# Patient Record
Sex: Female | Born: 1950 | Race: White | Hispanic: No | Marital: Single | Smoking: Former smoker
Health system: Southern US, Community
[De-identification: ages and names within clinical notes are randomized; demographics above are authoritative.]

## PROBLEM LIST (undated history)

## (undated) DIAGNOSIS — I2510 Atherosclerotic heart disease of native coronary artery without angina pectoris: Secondary | ICD-10-CM

## (undated) DIAGNOSIS — K219 Gastro-esophageal reflux disease without esophagitis: Secondary | ICD-10-CM

## (undated) DIAGNOSIS — N83209 Unspecified ovarian cyst, unspecified side: Secondary | ICD-10-CM

## (undated) DIAGNOSIS — I6529 Occlusion and stenosis of unspecified carotid artery: Secondary | ICD-10-CM

## (undated) DIAGNOSIS — J45909 Unspecified asthma, uncomplicated: Secondary | ICD-10-CM

## (undated) DIAGNOSIS — I10 Essential (primary) hypertension: Secondary | ICD-10-CM

## (undated) DIAGNOSIS — L309 Dermatitis, unspecified: Secondary | ICD-10-CM

## (undated) DIAGNOSIS — N393 Stress incontinence (female) (male): Secondary | ICD-10-CM

## (undated) DIAGNOSIS — T783XXA Angioneurotic edema, initial encounter: Secondary | ICD-10-CM

## (undated) DIAGNOSIS — J439 Emphysema, unspecified: Secondary | ICD-10-CM

## (undated) DIAGNOSIS — F419 Anxiety disorder, unspecified: Secondary | ICD-10-CM

## (undated) DIAGNOSIS — E785 Hyperlipidemia, unspecified: Secondary | ICD-10-CM

## (undated) DIAGNOSIS — K118 Other diseases of salivary glands: Secondary | ICD-10-CM

## (undated) DIAGNOSIS — J069 Acute upper respiratory infection, unspecified: Secondary | ICD-10-CM

## (undated) DIAGNOSIS — D126 Benign neoplasm of colon, unspecified: Secondary | ICD-10-CM

## (undated) DIAGNOSIS — Z9289 Personal history of other medical treatment: Secondary | ICD-10-CM

## (undated) DIAGNOSIS — K645 Perianal venous thrombosis: Secondary | ICD-10-CM

## (undated) DIAGNOSIS — I219 Acute myocardial infarction, unspecified: Secondary | ICD-10-CM

## (undated) DIAGNOSIS — J449 Chronic obstructive pulmonary disease, unspecified: Secondary | ICD-10-CM

## (undated) DIAGNOSIS — R0602 Shortness of breath: Secondary | ICD-10-CM

## (undated) DIAGNOSIS — J189 Pneumonia, unspecified organism: Secondary | ICD-10-CM

## (undated) DIAGNOSIS — Z8719 Personal history of other diseases of the digestive system: Secondary | ICD-10-CM

## (undated) DIAGNOSIS — F341 Dysthymic disorder: Secondary | ICD-10-CM

## (undated) DIAGNOSIS — K2270 Barrett's esophagus without dysplasia: Secondary | ICD-10-CM

## (undated) DIAGNOSIS — R011 Cardiac murmur, unspecified: Secondary | ICD-10-CM

## (undated) DIAGNOSIS — N39498 Other specified urinary incontinence: Secondary | ICD-10-CM

## (undated) DIAGNOSIS — M899 Disorder of bone, unspecified: Secondary | ICD-10-CM

## (undated) DIAGNOSIS — G43909 Migraine, unspecified, not intractable, without status migrainosus: Secondary | ICD-10-CM

## (undated) DIAGNOSIS — B009 Herpesviral infection, unspecified: Secondary | ICD-10-CM

## (undated) DIAGNOSIS — Z803 Family history of malignant neoplasm of breast: Secondary | ICD-10-CM

## (undated) DIAGNOSIS — M949 Disorder of cartilage, unspecified: Secondary | ICD-10-CM

## (undated) DIAGNOSIS — J42 Unspecified chronic bronchitis: Secondary | ICD-10-CM

## (undated) DIAGNOSIS — I359 Nonrheumatic aortic valve disorder, unspecified: Secondary | ICD-10-CM

## (undated) DIAGNOSIS — Z1379 Encounter for other screening for genetic and chromosomal anomalies: Secondary | ICD-10-CM

## (undated) HISTORY — DX: Disorder of bone, unspecified: M89.9

## (undated) HISTORY — DX: Chronic obstructive pulmonary disease, unspecified: J44.9

## (undated) HISTORY — DX: Migraine, unspecified, not intractable, without status migrainosus: G43.909

## (undated) HISTORY — DX: Unspecified ovarian cyst, unspecified side: N83.209

## (undated) HISTORY — PX: TONSILLECTOMY AND ADENOIDECTOMY: SUR1326

## (undated) HISTORY — DX: Hyperlipidemia, unspecified: E78.5

## (undated) HISTORY — DX: Angioneurotic edema, initial encounter: T78.3XXA

## (undated) HISTORY — DX: Family history of malignant neoplasm of breast: Z80.3

## (undated) HISTORY — PX: APPENDECTOMY: SHX54

## (undated) HISTORY — DX: Dermatitis, unspecified: L30.9

## (undated) HISTORY — DX: Unspecified asthma, uncomplicated: J45.909

## (undated) HISTORY — PX: OTHER SURGICAL HISTORY: SHX169

## (undated) HISTORY — DX: Other specified urinary incontinence: N39.498

## (undated) HISTORY — DX: Dysthymic disorder: F34.1

## (undated) HISTORY — DX: Occlusion and stenosis of unspecified carotid artery: I65.29

## (undated) HISTORY — DX: Emphysema, unspecified: J43.9

## (undated) HISTORY — DX: Benign neoplasm of colon, unspecified: D12.6

## (undated) HISTORY — DX: Herpesviral infection, unspecified: B00.9

## (undated) HISTORY — PX: ADENOIDECTOMY: SUR15

## (undated) HISTORY — DX: Barrett's esophagus without dysplasia: K22.70

## (undated) HISTORY — DX: Encounter for other screening for genetic and chromosomal anomalies: Z13.79

## (undated) HISTORY — PX: NASAL SINUS SURGERY: SHX719

## (undated) HISTORY — DX: Acute upper respiratory infection, unspecified: J06.9

## (undated) HISTORY — PX: TOTAL ABDOMINAL HYSTERECTOMY: SHX209

## (undated) HISTORY — DX: Stress incontinence (female) (male): N39.3

## (undated) HISTORY — PX: BLEPHAROPLASTY: SUR158

## (undated) HISTORY — DX: Atherosclerotic heart disease of native coronary artery without angina pectoris: I25.10

## (undated) HISTORY — DX: Gastro-esophageal reflux disease without esophagitis: K21.9

## (undated) HISTORY — DX: Nonrheumatic aortic valve disorder, unspecified: I35.9

## (undated) HISTORY — DX: Disorder of cartilage, unspecified: M94.9

## (undated) HISTORY — DX: Essential (primary) hypertension: I10

---

## 1998-09-03 DIAGNOSIS — K2270 Barrett's esophagus without dysplasia: Secondary | ICD-10-CM

## 1998-09-03 HISTORY — DX: Barrett's esophagus without dysplasia: K22.70

## 2001-12-25 ENCOUNTER — Encounter: Payer: Self-pay | Admitting: Internal Medicine

## 2001-12-25 ENCOUNTER — Ambulatory Visit (HOSPITAL_COMMUNITY): Admission: RE | Admit: 2001-12-25 | Discharge: 2001-12-25 | Payer: Self-pay | Admitting: Internal Medicine

## 2004-07-11 ENCOUNTER — Ambulatory Visit: Payer: Self-pay | Admitting: Internal Medicine

## 2004-07-20 ENCOUNTER — Ambulatory Visit: Payer: Self-pay

## 2004-07-31 ENCOUNTER — Ambulatory Visit: Payer: Self-pay | Admitting: Internal Medicine

## 2004-11-01 ENCOUNTER — Ambulatory Visit: Payer: Self-pay | Admitting: Internal Medicine

## 2005-01-17 ENCOUNTER — Ambulatory Visit: Payer: Self-pay | Admitting: Internal Medicine

## 2005-02-05 ENCOUNTER — Ambulatory Visit: Payer: Self-pay | Admitting: Internal Medicine

## 2005-02-07 ENCOUNTER — Ambulatory Visit: Payer: Self-pay | Admitting: Internal Medicine

## 2005-02-19 ENCOUNTER — Encounter (INDEPENDENT_AMBULATORY_CARE_PROVIDER_SITE_OTHER): Payer: Self-pay | Admitting: *Deleted

## 2005-02-19 ENCOUNTER — Ambulatory Visit: Payer: Self-pay | Admitting: Internal Medicine

## 2005-03-27 ENCOUNTER — Ambulatory Visit: Payer: Self-pay | Admitting: Internal Medicine

## 2006-10-10 ENCOUNTER — Ambulatory Visit: Payer: Self-pay | Admitting: Internal Medicine

## 2006-10-27 ENCOUNTER — Emergency Department (HOSPITAL_COMMUNITY): Admission: EM | Admit: 2006-10-27 | Discharge: 2006-10-27 | Payer: Self-pay | Admitting: Emergency Medicine

## 2006-10-29 ENCOUNTER — Ambulatory Visit: Payer: Self-pay | Admitting: Pulmonary Disease

## 2006-10-29 ENCOUNTER — Ambulatory Visit: Payer: Self-pay | Admitting: Internal Medicine

## 2006-10-29 ENCOUNTER — Inpatient Hospital Stay (HOSPITAL_COMMUNITY): Admission: EM | Admit: 2006-10-29 | Discharge: 2006-11-05 | Payer: Self-pay | Admitting: Emergency Medicine

## 2006-10-29 ENCOUNTER — Ambulatory Visit: Admission: AD | Admit: 2006-10-29 | Discharge: 2006-10-29 | Payer: Self-pay | Admitting: Internal Medicine

## 2006-10-29 ENCOUNTER — Ambulatory Visit: Payer: Self-pay | Admitting: Cardiology

## 2006-11-01 ENCOUNTER — Encounter: Payer: Self-pay | Admitting: Cardiology

## 2006-11-11 ENCOUNTER — Ambulatory Visit: Payer: Self-pay | Admitting: Internal Medicine

## 2006-11-21 ENCOUNTER — Ambulatory Visit: Payer: Self-pay | Admitting: Internal Medicine

## 2006-12-09 ENCOUNTER — Ambulatory Visit: Payer: Self-pay | Admitting: Internal Medicine

## 2007-03-13 ENCOUNTER — Ambulatory Visit: Payer: Self-pay | Admitting: Internal Medicine

## 2007-04-14 DIAGNOSIS — M899 Disorder of bone, unspecified: Secondary | ICD-10-CM

## 2007-04-14 DIAGNOSIS — K2270 Barrett's esophagus without dysplasia: Secondary | ICD-10-CM | POA: Insufficient documentation

## 2007-04-14 DIAGNOSIS — M8580 Other specified disorders of bone density and structure, unspecified site: Secondary | ICD-10-CM | POA: Insufficient documentation

## 2007-04-14 DIAGNOSIS — I10 Essential (primary) hypertension: Secondary | ICD-10-CM | POA: Insufficient documentation

## 2007-04-14 HISTORY — DX: Disorder of bone, unspecified: M89.9

## 2007-04-14 HISTORY — DX: Essential (primary) hypertension: I10

## 2007-06-06 ENCOUNTER — Ambulatory Visit: Payer: Self-pay | Admitting: Internal Medicine

## 2007-07-23 ENCOUNTER — Ambulatory Visit: Payer: Self-pay | Admitting: Internal Medicine

## 2007-07-23 DIAGNOSIS — J449 Chronic obstructive pulmonary disease, unspecified: Secondary | ICD-10-CM

## 2007-07-23 DIAGNOSIS — J441 Chronic obstructive pulmonary disease with (acute) exacerbation: Secondary | ICD-10-CM | POA: Insufficient documentation

## 2007-07-23 DIAGNOSIS — J4489 Other specified chronic obstructive pulmonary disease: Secondary | ICD-10-CM

## 2007-07-23 HISTORY — DX: Chronic obstructive pulmonary disease, unspecified: J44.9

## 2007-07-23 HISTORY — DX: Other specified chronic obstructive pulmonary disease: J44.89

## 2007-08-26 ENCOUNTER — Ambulatory Visit: Payer: Self-pay | Admitting: Internal Medicine

## 2007-08-26 DIAGNOSIS — N39498 Other specified urinary incontinence: Secondary | ICD-10-CM

## 2007-08-26 HISTORY — DX: Other specified urinary incontinence: N39.498

## 2007-10-20 ENCOUNTER — Telehealth (INDEPENDENT_AMBULATORY_CARE_PROVIDER_SITE_OTHER): Payer: Self-pay | Admitting: *Deleted

## 2008-03-16 ENCOUNTER — Ambulatory Visit: Payer: Self-pay | Admitting: Internal Medicine

## 2008-03-16 LAB — CONVERTED CEMR LAB
BUN: 10 mg/dL (ref 6–23)
Calcium: 9.2 mg/dL (ref 8.4–10.5)
Creatinine, Ser: 0.7 mg/dL (ref 0.4–1.2)
GFR calc Af Amer: 111 mL/min
GFR calc non Af Amer: 91 mL/min
Glucose, Bld: 81 mg/dL (ref 70–99)
Potassium: 4.2 meq/L (ref 3.5–5.1)
Sodium: 132 meq/L — ABNORMAL LOW (ref 135–145)

## 2008-03-17 ENCOUNTER — Encounter: Payer: Self-pay | Admitting: Internal Medicine

## 2008-03-17 LAB — CONVERTED CEMR LAB: Vit D, 1,25-Dihydroxy: 32 (ref 30–89)

## 2008-04-20 ENCOUNTER — Ambulatory Visit: Payer: Self-pay | Admitting: Internal Medicine

## 2008-04-20 DIAGNOSIS — G43909 Migraine, unspecified, not intractable, without status migrainosus: Secondary | ICD-10-CM

## 2008-04-20 HISTORY — DX: Migraine, unspecified, not intractable, without status migrainosus: G43.909

## 2008-07-20 ENCOUNTER — Ambulatory Visit: Payer: Self-pay | Admitting: Internal Medicine

## 2008-07-20 LAB — CONVERTED CEMR LAB
BUN: 8 mg/dL (ref 6–23)
CO2: 29 meq/L (ref 19–32)
Calcium: 8.8 mg/dL (ref 8.4–10.5)
Chloride: 104 meq/L (ref 96–112)
Creatinine, Ser: 0.6 mg/dL (ref 0.4–1.2)
GFR calc non Af Amer: 109 mL/min
Glucose, Bld: 66 mg/dL — ABNORMAL LOW (ref 70–99)
Sodium: 138 meq/L (ref 135–145)

## 2008-07-21 ENCOUNTER — Encounter: Payer: Self-pay | Admitting: Internal Medicine

## 2008-08-09 ENCOUNTER — Ambulatory Visit: Payer: Self-pay | Admitting: Internal Medicine

## 2008-09-03 HISTORY — PX: CORONARY ANGIOPLASTY WITH STENT PLACEMENT: SHX49

## 2008-10-20 ENCOUNTER — Ambulatory Visit: Payer: Self-pay | Admitting: Internal Medicine

## 2008-11-16 ENCOUNTER — Ambulatory Visit: Payer: Self-pay | Admitting: Internal Medicine

## 2008-11-16 ENCOUNTER — Telehealth: Payer: Self-pay | Admitting: Internal Medicine

## 2009-02-15 ENCOUNTER — Ambulatory Visit: Payer: Self-pay | Admitting: Internal Medicine

## 2009-02-15 DIAGNOSIS — F341 Dysthymic disorder: Secondary | ICD-10-CM

## 2009-02-15 HISTORY — DX: Dysthymic disorder: F34.1

## 2009-04-12 ENCOUNTER — Ambulatory Visit: Payer: Self-pay | Admitting: Internal Medicine

## 2009-05-13 ENCOUNTER — Telehealth: Payer: Self-pay | Admitting: Internal Medicine

## 2009-05-31 ENCOUNTER — Ambulatory Visit: Payer: Self-pay | Admitting: Internal Medicine

## 2009-07-06 ENCOUNTER — Ambulatory Visit: Payer: Self-pay | Admitting: Internal Medicine

## 2009-07-06 LAB — CONVERTED CEMR LAB
Eosinophils Relative: 0.8 % (ref 0.0–5.0)
Hemoglobin: 13.2 g/dL (ref 12.0–15.0)
Lymphs Abs: 0.9 10*3/uL (ref 0.7–4.0)
MCHC: 33.3 g/dL (ref 30.0–36.0)
MCV: 95.4 fL (ref 78.0–100.0)
Monocytes Relative: 5.3 % (ref 3.0–12.0)
RBC: 4.15 M/uL (ref 3.87–5.11)
RDW: 13.9 % (ref 11.5–14.6)

## 2009-07-07 ENCOUNTER — Telehealth: Payer: Self-pay | Admitting: Internal Medicine

## 2009-08-22 ENCOUNTER — Ambulatory Visit: Payer: Self-pay | Admitting: Internal Medicine

## 2009-08-22 LAB — CONVERTED CEMR LAB
AST: 21 units/L (ref 0–37)
Alkaline Phosphatase: 39 units/L (ref 39–117)
BUN: 14 mg/dL (ref 6–23)
Basophils Absolute: 0 10*3/uL (ref 0.0–0.1)
Basophils Relative: 0.7 % (ref 0.0–3.0)
Bilirubin, Direct: 0 mg/dL (ref 0.0–0.3)
CO2: 31 meq/L (ref 19–32)
Calcium: 8.9 mg/dL (ref 8.4–10.5)
Chloride: 104 meq/L (ref 96–112)
Cholesterol: 268 mg/dL — ABNORMAL HIGH (ref 0–200)
Direct LDL: 177.3 mg/dL
Eosinophils Absolute: 0 10*3/uL (ref 0.0–0.7)
Eosinophils Relative: 0.5 % (ref 0.0–5.0)
GFR calc non Af Amer: 77.67 mL/min (ref >60)
Glucose, Bld: 104 mg/dL — ABNORMAL HIGH (ref 70–99)
HCT: 38.9 % (ref 36.0–46.0)
Hemoglobin: 13 g/dL (ref 12.0–15.0)
Lymphs Abs: 1.1 10*3/uL (ref 0.7–4.0)
Monocytes Absolute: 0.5 10*3/uL (ref 0.1–1.0)
Neutro Abs: 2.7 10*3/uL (ref 1.4–7.7)
Neutrophils Relative %: 61.4 % (ref 43.0–77.0)
Potassium: 4.7 meq/L (ref 3.5–5.1)
Specific Gravity, Urine: 1.015
TSH: 0.5 microintl units/mL (ref 0.35–5.50)
Total CHOL/HDL Ratio: 5
Total Protein: 6.4 g/dL (ref 6.0–8.3)
Urobilinogen, UA: 0.2
VLDL: 21.8 mg/dL (ref 0.0–40.0)
WBC: 4.3 10*3/uL — ABNORMAL LOW (ref 4.5–10.5)

## 2009-08-29 ENCOUNTER — Ambulatory Visit: Payer: Self-pay | Admitting: Internal Medicine

## 2009-08-29 DIAGNOSIS — E785 Hyperlipidemia, unspecified: Secondary | ICD-10-CM

## 2009-08-29 HISTORY — DX: Hyperlipidemia, unspecified: E78.5

## 2009-09-03 DIAGNOSIS — I219 Acute myocardial infarction, unspecified: Secondary | ICD-10-CM

## 2009-09-03 HISTORY — PX: OVARIAN CYST REMOVAL: SHX89

## 2009-09-03 HISTORY — DX: Acute myocardial infarction, unspecified: I21.9

## 2009-10-06 ENCOUNTER — Telehealth: Payer: Self-pay | Admitting: Internal Medicine

## 2009-11-17 ENCOUNTER — Ambulatory Visit: Payer: Self-pay | Admitting: Internal Medicine

## 2009-11-17 LAB — CONVERTED CEMR LAB
AST: 17 units/L (ref 0–37)
Bilirubin, Direct: 0.1 mg/dL (ref 0.0–0.3)
HDL: 71.4 mg/dL (ref >39.00)
Total Bilirubin: 0.4 mg/dL (ref 0.3–1.2)
Total CHOL/HDL Ratio: 3
Total Protein: 6.3 g/dL (ref 6.0–8.3)

## 2009-11-28 ENCOUNTER — Ambulatory Visit: Payer: Self-pay | Admitting: Internal Medicine

## 2010-01-05 ENCOUNTER — Telehealth: Payer: Self-pay | Admitting: Internal Medicine

## 2010-01-16 ENCOUNTER — Ambulatory Visit: Payer: Self-pay | Admitting: Internal Medicine

## 2010-01-16 DIAGNOSIS — B009 Herpesviral infection, unspecified: Secondary | ICD-10-CM | POA: Insufficient documentation

## 2010-01-16 HISTORY — DX: Herpesviral infection, unspecified: B00.9

## 2010-01-24 ENCOUNTER — Encounter (INDEPENDENT_AMBULATORY_CARE_PROVIDER_SITE_OTHER): Payer: Self-pay | Admitting: *Deleted

## 2010-02-13 ENCOUNTER — Ambulatory Visit: Payer: Self-pay | Admitting: Internal Medicine

## 2010-03-13 ENCOUNTER — Ambulatory Visit: Payer: Self-pay | Admitting: Internal Medicine

## 2010-04-19 ENCOUNTER — Ambulatory Visit: Payer: Self-pay | Admitting: Internal Medicine

## 2010-04-19 ENCOUNTER — Inpatient Hospital Stay (HOSPITAL_COMMUNITY): Admission: EM | Admit: 2010-04-19 | Discharge: 2010-04-22 | Payer: Self-pay | Admitting: Emergency Medicine

## 2010-04-19 ENCOUNTER — Telehealth: Payer: Self-pay | Admitting: Internal Medicine

## 2010-04-24 ENCOUNTER — Telehealth (INDEPENDENT_AMBULATORY_CARE_PROVIDER_SITE_OTHER): Payer: Self-pay | Admitting: *Deleted

## 2010-05-17 ENCOUNTER — Ambulatory Visit: Payer: Self-pay | Admitting: Internal Medicine

## 2010-05-17 ENCOUNTER — Encounter: Payer: Self-pay | Admitting: Physician Assistant

## 2010-05-17 DIAGNOSIS — I6529 Occlusion and stenosis of unspecified carotid artery: Secondary | ICD-10-CM

## 2010-05-17 DIAGNOSIS — I2510 Atherosclerotic heart disease of native coronary artery without angina pectoris: Secondary | ICD-10-CM | POA: Insufficient documentation

## 2010-05-17 DIAGNOSIS — I25119 Atherosclerotic heart disease of native coronary artery with unspecified angina pectoris: Secondary | ICD-10-CM | POA: Insufficient documentation

## 2010-05-17 HISTORY — DX: Atherosclerotic heart disease of native coronary artery without angina pectoris: I25.10

## 2010-05-17 HISTORY — DX: Occlusion and stenosis of unspecified carotid artery: I65.29

## 2010-05-18 ENCOUNTER — Ambulatory Visit: Payer: Self-pay | Admitting: Internal Medicine

## 2010-05-18 LAB — CONVERTED CEMR LAB

## 2010-05-24 ENCOUNTER — Encounter: Payer: Self-pay | Admitting: Internal Medicine

## 2010-06-09 ENCOUNTER — Ambulatory Visit: Payer: Self-pay | Admitting: Internal Medicine

## 2010-07-06 ENCOUNTER — Encounter: Payer: Self-pay | Admitting: Internal Medicine

## 2010-07-06 ENCOUNTER — Ambulatory Visit: Payer: Self-pay | Admitting: Internal Medicine

## 2010-07-06 ENCOUNTER — Ambulatory Visit: Payer: Self-pay | Admitting: Cardiology

## 2010-07-06 ENCOUNTER — Ambulatory Visit: Payer: Self-pay

## 2010-07-06 ENCOUNTER — Ambulatory Visit (HOSPITAL_COMMUNITY): Admission: RE | Admit: 2010-07-06 | Discharge: 2010-07-06 | Payer: Self-pay | Admitting: Internal Medicine

## 2010-07-07 ENCOUNTER — Ambulatory Visit: Payer: Self-pay | Admitting: Internal Medicine

## 2010-07-07 DIAGNOSIS — I359 Nonrheumatic aortic valve disorder, unspecified: Secondary | ICD-10-CM

## 2010-07-07 DIAGNOSIS — I350 Nonrheumatic aortic (valve) stenosis: Secondary | ICD-10-CM | POA: Insufficient documentation

## 2010-07-07 HISTORY — DX: Nonrheumatic aortic valve disorder, unspecified: I35.9

## 2010-07-11 LAB — CONVERTED CEMR LAB
ALT: 18 units/L (ref 0–35)
Albumin: 3.9 g/dL (ref 3.5–5.2)
Alkaline Phosphatase: 45 units/L (ref 39–117)
BUN: 12 mg/dL (ref 6–23)
CO2: 28 meq/L (ref 19–32)
Glucose, Bld: 100 mg/dL — ABNORMAL HIGH (ref 70–99)
HDL: 52.5 mg/dL (ref >39.00)
Total Bilirubin: 0.4 mg/dL (ref 0.3–1.2)
Total Protein: 6.2 g/dL (ref 6.0–8.3)

## 2010-07-13 ENCOUNTER — Encounter (INDEPENDENT_AMBULATORY_CARE_PROVIDER_SITE_OTHER): Payer: Self-pay | Admitting: Obstetrics and Gynecology

## 2010-07-13 ENCOUNTER — Inpatient Hospital Stay (HOSPITAL_COMMUNITY): Admission: RE | Admit: 2010-07-13 | Discharge: 2010-07-15 | Payer: Self-pay | Admitting: Obstetrics and Gynecology

## 2010-09-03 HISTORY — PX: AORTIC VALVE REPLACEMENT: SHX41

## 2010-10-02 ENCOUNTER — Encounter: Payer: Self-pay | Admitting: Internal Medicine

## 2010-10-02 ENCOUNTER — Other Ambulatory Visit: Payer: Self-pay | Admitting: Internal Medicine

## 2010-10-02 ENCOUNTER — Ambulatory Visit
Admission: RE | Admit: 2010-10-02 | Discharge: 2010-10-02 | Payer: Self-pay | Source: Home / Self Care | Attending: Internal Medicine | Admitting: Internal Medicine

## 2010-10-03 LAB — HDL CHOLESTEROL: HDL: 54.2 mg/dL (ref >39.00)

## 2010-10-05 NOTE — Assessment & Plan Note (Signed)
Summary: eph/mt   Visit Type:  Follow-up Primary Provider:  Stacie Glaze MD  CC:  no complaints.  History of Present Illness: This is a 60 year old white female patient who was admitted to the hospital with a non-ST elevation MI April 19, 2010. This was treated with a bare-metal stent in to the RCA. She has residual 50-60% circumflex. There was also mild reduction in LV function with inferior wall motion abnormality. Her hospitalization was complicated by an ovarian cyst for which she had a CT scan for her she is a very large pelvic ovarian cyst in which she will probably need laparoscopic resection in the next 3 months.  The patient returned to work 2 weeks after her MI. She is having occasional chest tightness that only lasts a few seconds and goes away before turns into anything. She said that similar to when she had her MI late it goes away quickly. She has not used nitroglycerin. This occurs at rest and usually when she is under stress at work. She has no exertional symptoms. She is actually asking to go skydiving a bulll riding. The patient denies dyspnea, dyspnea on exertion, palpitations, dizziness, or presyncope.  Current Medications (verified): 1)  Nadolol 20 Mg  Tabs (Nadolol) .... Take 1 Tablet By Mouth Once A Day 2)  Pristiq 100 Mg Xr24h-Tab (Desvenlafaxine Succinate) .... One By Mouth [place]aly 3)  Vivelle 0.05 Mg/24hr  Pttw (Estradiol) .... Change Twice A Week 4)  Dexilant 60 Mg Cpdr (Dexlansoprazole) .... One By Mouth Daily 5)  Carafate 1 Gm Tabs (Sucralfate) .Marland Kitchen.. 1 Three Times A Day 6)  Crestor 10 Mg Tabs (Rosuvastatin Calcium) .... Take One Tablet By Mouth Daily. 7)  Spiriva Handihaler 18 Mcg Caps (Tiotropium Bromide Monohydrate) .... One Ppuff By Mouth Daily 8)  Dulera 100-5 Mcg/act Aero (Mometasone Furo-Formoterol Fum) .... One Puf Two Times A Day 9)  Acyclovir 400 Mg Tabs (Acyclovir) .... One Ppo Three Times A Day As Needed Cold Sore 10)  Aspirin Ec 325 Mg Tbec (Aspirin)  .... Take One Tablet By Mouth Daily 11)  Plavix 75 Mg Tabs (Clopidogrel Bisulfate) .... Take One Tablet By Mouth Daily  Allergies (verified): 1)  ! Codeine Sulfate (Codeine Sulfate) 2)  ! Hydrocodone-Acetaminophen (Hydrocodone-Acetaminophen) 3)  ! Augmentin (Amoxicillin-Pot Clavulanate)  Past History:  Past Medical History: Last updated: 04/14/2007 GERD Hypertension Osteopenia menopause Barretts  Review of Systems       the history of present illness  Vital Signs:  Patient profile:   60 year old female Height:      62 inches Weight:      146 pounds Pulse rate:   59 / minute BP sitting:   144 / 93  (left arm) Cuff size:   large  Vitals Entered By: Burnett Kanaris, CNA (May 17, 2010 12:38 PM)  Physical Exam  General:   Well-nournished, in no acute distress. Neck: Bilateral carotid bruits versus murmur portrayed, No JVD, HJR,  or thyroid enlargement Lungs: No tachypnea, clear without wheezing, rales, or rhonchi Cardiovascular: RRR, PMI not displaced, heart sounds normal, 2-3/6 systolic murmur at the left sternal border and right sternal border,no gallops, bruit, thrill, or heave. Abdomen: BS normal. Soft without organomegaly, masses, lesions or tenderness. Extremities: Right and left groin stable without hematoma or hemorrhage,without cyanosis, clubbing or edema. Good distal pulses bilateral SKin: Warm, no lesions or rashes  Musculoskeletal: No deformities Neuro: no focal signs    EKG  Procedure date:  05/17/2010  Findings:  sinus bradycardia no acute change  Impression & Recommendations:  Problem # 1:  CAD, NATIVE VESSEL (ICD-414.01) Patient had a non-ST elevation MI April 19, 2010 treated with non-drug-eluting stent to the RCA with a residual 50-60% circumflex and mild LV dysfunction with and her wall motion abnormality. She is having occasional fleeting chest tightness that only lasts a few seconds. This occurs at rest. Not sure this is cardiac  related. She has no exertional symptoms. She does need laparoscopic surgery in the next 3 months for an ovarian cyst. I asked her to call if her chest pain worsens or becomes more prolonged period Her updated medication list for this problem includes:    Nadolol 20 Mg Tabs (Nadolol) .Marland Kitchen... Take 1 tablet by mouth once a day    Aspirin Ec 325 Mg Tbec (Aspirin) .Marland Kitchen... Take one tablet by mouth daily    Plavix 75 Mg Tabs (Clopidogrel bisulfate) .Marland Kitchen... Take one tablet by mouth daily  Problem # 2:  UNDIAGNOSED CARDIAC MURMURS (ICD-785.2) Patient had a systolic murmur consistent with AS. We will order a 2-D echo to verify. Her updated medication list for this problem includes:    Nadolol 20 Mg Tabs (Nadolol) .Marland Kitchen... Take 1 tablet by mouth once a day  Orders: Echocardiogram (Echo)  Problem # 3:  CAROTID BRUITS, BILATERAL (ICD-785.9)  Orders: Carotid Duplex (Carotid Duplex)  Problem # 4:  HYPERTENSION (ICD-401.9)  Her updated medication list for this problem includes:patient's blood pressure is low on the high side but she is quite anxious today. Need to monitor.    Nadolol 20 Mg Tabs (Nadolol) .Marland Kitchen... Take 1 tablet by mouth once a day    Aspirin Ec 325 Mg Tbec (Aspirin) .Marland Kitchen... Take one tablet by mouth daily  Other Orders: EKG w/ Interpretation (93000)  Patient Instructions: 1)  Your physician recommends that you schedule a follow-up appointment in 2weeks with Dr. Gala Romney for surgical clearance. 2)  Your physician recommends that you continue on your current medications as directed. Please refer to the Current Medication list given to you today. 3)  Your physician has requested that you have a carotid duplex. This test is an ultrasound of the carotid arteries in your neck. It looks at blood flow through these arteries that supply the brain with blood. Allow one hour for this exam. There are no restrictions or special instructions. 4)  Your physician has requested that you have an echocardiogram.   Echocardiography is a painless test that uses sound waves to create images of your heart. It provides your doctor with information about the size and shape of your heart and how well your heart's chambers and valves are working.  This procedure takes approximately one hour. There are no restrictions for this procedure.

## 2010-10-05 NOTE — Progress Notes (Signed)
Summary: REQ FOR RX  Phone Note Call from Patient   Caller: Patient [phone] Reason for Call: Talk to Nurse, Talk to Doctor Summary of Call: Pt called in to adv that she needs a RX for Prestiq (Desvenlafaxine).... Pt adv that she was given samples but has used them all and now needs a RX for med.... Pt adv that same can be sent to [facility] on [address].  Initial call taken by: Debbra Riding,  October 06, 2009 1:40 PM    Prescriptions: PRISTIQ 50 MG XR24H-TAB (DESVENLAFAXINE SUCCINATE) 1 once daily  #30 x 5   Entered by:   Willy Eddy, LPN   Authorized by:   Stacie Glaze MD   Signed by:   Willy Eddy, LPN on 64/40/3474   Method used:   Electronically to        [facility]  [phone]* (retail)       [address]       [place], [place]  [address]       Ph: [phone] or [phone]       Fax: [phone]   RxID:   [id number]

## 2010-10-05 NOTE — Assessment & Plan Note (Signed)
Summary: 3 month rov/njr   Vital Signs:  Patient profile:   60 year old female Height:      62 inches Weight:      148 pounds BMI:     27.17 Temp:     98.2 degrees F oral Pulse rate:   76 / minute Resp:     14 per minute BP sitting:   140 / 80  (left arm)  Vitals Entered By: Willy Eddy, LPN (November 28, 2009 4:38 PM) CC: roa labs, Lipid Management   CC:  roa labs and Lipid Management.  Lipid Management History:      Positive NCEP/ATP III risk factors include female age 42 years old or older and hypertension.  Negative NCEP/ATP III risk factors include non-diabetic, HDL cholesterol greater than 60, no family history for ischemic heart disease, non-tobacco-user status, no ASHD (atherosclerotic heart disease), no prior stroke/TIA, and no peripheral vascular disease.    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 Take 1 Tablet By Mouth Once A Day 2)  Pristiq 50 Mg Xr24h-Tab (Desvenlafaxine Succinate) .Marland Kitchen.. 1 Once Daily 3)  Vivelle 0.05 Mg/24hr  Pttw (Estradiol) .... Change Twice A Week 4)  Prevacid 15 Mg Cpdr (Lansoprazole) .... One By Mouth Bid 5)  Carafate 1 Gm Tabs (Sucralfate) .Marland Kitchen.. 1 Three Times A Day 6)  Vitamin D 91478 Unit  Caps  (Ergocalciferol) .... One By Mouth Weekly 7)  Symbicort 160-4.5 Mcg/act Aero (Budesonide-Formoterol Fumarate) .... One Puff By Mouth Two Times A Day 8)  Crestor 20 Mg Tabs (Rosuvastatin Calcium) .... One By Mouth Weekly Every Monday Night  Allergies (verified): 1)  ! Codeine Sulfate (Codeine Sulfate) 2)  ! Hydrocodone-Acetaminophen (Hydrocodone-Acetaminophen) 3)  ! Augmentin (Amoxicillin-Pot Clavulanate)  Past History:  Family History: Last updated: 06/06/2007 Family History of CAD Female 1st degree relative <60 COPD  Social History: Last updated: 06/06/2007 Married Former Smoker Alcohol use-no Drug use-no  Risk Factors: Smoking Status: quit (11/28/2009)  Past medical, surgical, family and social histories (including risk factors) reviewed, and no changes noted (except as noted below).  Past Medical History: Reviewed history from 04/14/2007 and no changes required. GERD Hypertension Osteopenia menopause Barretts  Past Surgical History: Reviewed history from 04/14/2007 and no changes required. Hysterectomy--TAH Sinus surgery Tonsillectomy, adenoidectomy  Family History: Reviewed history from 06/06/2007 and no changes required. Family History of CAD Female 1st degree relative <60 COPD  Social History: Reviewed history from 06/06/2007 and no changes required. Married Former Smoker Alcohol use-no Drug use-no  Review of Systems       The patient complains of hoarseness, dyspnea on exertion, and prolonged cough.  The patient denies anorexia, fever, weight loss, weight gain, vision loss, decreased hearing, chest pain, syncope, peripheral edema, headaches, hemoptysis, abdominal pain, melena, hematochezia, severe indigestion/heartburn, hematuria, incontinence, genital sores,  muscle weakness, suspicious skin lesions, transient blindness, difficulty walking, depression, unusual weight change, abnormal bleeding, enlarged lymph nodes, angioedema, and breast masses.     Physical Exam  General:  cachetic and pale.   Head:  normocephalic and atraumatic.   Eyes:  pupils equal and pupils round.   no hemmorrhages Ears:  R ear normal and L ear normal.   Nose:  nasal dischargemucosal pallor, mucosal edema, and airflow obstruction.   Neck:  nuchal rigidity and decreased ROM.   Lungs:  normal respiratory effort and no wheezes.   Heart:  normal rate and regular rhythm.   Abdomen:  soft, non-tender, and normal bowel sounds.   Msk:  no joint swelling, no joint warmth, and no redness over joints.   Extremities:  No clubbing, cyanosis, edema, or deformity noted with normal full range of motion of all joints.     Impression & Recommendations:  Problem # 1:  HYPERLIPIDEMIA, MILD (ICD-272.4)  Her updated medication list for this problem includes:    Crestor 20 Mg Tabs (Rosuvastatin calcium) ..... One by mouth weekly every monday night  Labs Reviewed: SGOT: 17 (11/17/2009)   SGPT: 17 (11/17/2009)  Prior 10 Yr Risk Heart Disease: Not enough information (07/23/2007)   HDL:71.40 (11/17/2009), 54.20 (08/22/2009)  Chol:210 (11/17/2009), 268 (08/22/2009)  Trig:109.0 (11/17/2009), 109.0 (08/22/2009)  Problem # 2:  CHRONIC OBSTRUCTIVE ASTHMA UNSPECIFIED (ICD-493.20)  The following medications were removed from the medication list:    Symbicort 160-4.5 Mcg/act Aero (Budesonide-formoterol fumarate) ..... One puff by mouth two times a day Her updated medication list for this problem includes:    Advair Diskus 250-50 Mcg/dose Aepb (Fluticasone-salmeterol) ..... One puff two times a day  Complete Medication List: 1)  Nadolol 20 Mg Tabs (Nadolol) .... Take 1 tablet by mouth once a day 2)  Pristiq 50 Mg Xr24h-tab (Desvenlafaxine succinate) .Marland Kitchen.. 1 once daily 3)  Vivelle 0.05 Mg/24hr Pttw (Estradiol) .... Change twice a week 4)  Prevacid 15 Mg Cpdr (Lansoprazole) .... One by mouth bid 5)  Carafate 1 Gm Tabs (Sucralfate) .Marland Kitchen.. 1 three times a day 6)  Vitamin D 04540 Unit  Caps (Ergocalciferol) .... One by mouth weekly 7)  Crestor 20 Mg Tabs (Rosuvastatin calcium) .... One by mouth weekly every monday night 8)  Advair Diskus 250-50 Mcg/dose Aepb (Fluticasone-salmeterol) .... One puff two times a day  Lipid Assessment/Plan:      Based on NCEP/ATP III, the patient's risk factor category is "0-1 risk factors".  The patient's lipid goals are as follows: Total cholesterol goal is 200; LDL cholesterol goal is 160; HDL cholesterol goal is 40; Triglyceride goal is 150.     Patient Instructions: 1)  Please schedule a follow-up appointment in 1 month.

## 2010-10-05 NOTE — Assessment & Plan Note (Signed)
Summary: 3 wk rov/njr   Vital Signs:  Patient profile:   60 year old female Height:      62 inches Weight:      148 pounds BMI:     27.17 Temp:     98.2 degrees F oral Pulse rate:   72 / minute Resp:     14 per minute BP sitting:   132 / 86  Vitals Entered By: Willy Eddy, LPN (June 09, 2010 4:30 PM)  Nutrition Counseling: Patient's BMI is greater than 25 and therefore counseled on weight management options. CC: roa after increasing nadolol  to bid, Hypertension Management Is Patient Diabetic? No   Primary Care Wilbern Pennypacker:  Stacie Glaze MD  CC:  roa after increasing nadolol  to bid and Hypertension Management.  History of Present Illness: follow up blood pressure increased nadolol to two times a day this has heled her migraines which are less frequent  Hypertension Follow-Up      This is a 60 year old woman who presents for Hypertension follow-up.  The patient denies lightheadedness, urinary frequency, headaches, edema, impotence, rash, and fatigue.  The patient denies the following associated symptoms: chest pain, chest pressure, exercise intolerance, dyspnea, palpitations, syncope, leg edema, and pedal edema.  Compliance with medications (by patient report) has been near 100%.  The patient reports that dietary compliance has been good.  The patient reports exercising 3-4X per week.    Hypertension History:      She denies headache, chest pain, palpitations, dyspnea with exertion, orthopnea, PND, peripheral edema, visual symptoms, neurologic problems, syncope, and side effects from treatment.        Positive major cardiovascular risk factors include female age 5 years old or older, hyperlipidemia, and hypertension.  Negative major cardiovascular risk factors include no history of diabetes, negative family history for ischemic heart disease, and non-tobacco-user status.        Positive history for target organ damage include ASHD (either angina/prior MI/prior CABG).   Further assessment for target organ damage reveals no history of cardiac end-organ damage (CHF/LVH), stroke/TIA, peripheral vascular disease, renal insufficiency, or hypertensive retinopathy.     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) 7)  Anxiety Depression  (ICD-300.4) 8)  Chronic Obstructive Asthma Unspecified  (ICD-493.20) 9)  Acute Maxillary Sinusitis  (ICD-461.0) 10)  Migraine Headache  (ICD-346.90) 11)  Urinary Incontinence, Stress, Mild  (ICD-788.39) 12)  Chronic Obstructive Pulmonary Disease, Mild  (ICD-496) 13)  Family History of Cad Female 1st Degree Relative <60  (ICD-V16.49) 14)  Barrett's Esophagus  (ICD-530.85) 15)  Menopausal Syndrome  (ICD-627.2) 16)  Osteopenia  (ICD-733.90) 17)  Hypertension  (ICD-401.9) 18)  Gerd  (ICD-530.81)  Current Problems (verified): 1)  Cad, Native Vessel  (ICD-414.01) 2)  Undiagnosed Cardiac Murmurs  (ICD-785.2) 3)  Carotid Bruits, 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) 4)  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) 5)  Preventive Health Care  (ICD-V70.0) 6)  Hyperlipidemia, Mild  (ICD-272.4) 7)  Anxiety Depression  (ICD-300.4) 8)  Chronic Obstructive Asthma Unspecified  (ICD-493.20) 9)  Acute  Maxillary Sinusitis  (ICD-461.0) 10)  Migraine Headache  (ICD-346.90) 11)  Urinary Incontinence, Stress, Mild  (ICD-788.39) 12)  Chronic Obstructive Pulmonary Disease, Mild  (ICD-496) 13)  Family History of Cad Female 1st Degree Relative <60  (ICD-V16.49) 14)  Barrett's Esophagus  (ICD-530.85) 15)  Menopausal Syndrome   (ICD-627.2) 16)  Osteopenia  (ICD-733.90) 17)  Hypertension  (ICD-401.9) 18)  Gerd  (ICD-530.81)  Medications Prior to Update: 1)  Nadolol 20 Mg  Tabs (Nadolol) .... Take 1 Tablet By Mouth Two Times A Day ( Increased 05/18/2010) 2)  Pristiq 100 Mg Xr24h-Tab (Desvenlafaxine Succinate) .... One By Mouth Daly 3)  Vivelle 0.05 Mg/24hr  Pttw (Estradiol) .... Change Twice A Week 4)  Dexilant 60 Mg Cpdr (Dexlansoprazole) .... One By Mouth Daily 5)  Carafate 1 Gm Tabs (Sucralfate) .Marland Kitchen.. 1 Three Times A Day 6)  Crestor 10 Mg Tabs (Rosuvastatin Calcium) .... Take One Tablet By Mouth Daily. 7)  Spiriva Handihaler 18 Mcg Caps (Tiotropium Bromide Monohydrate) .... One Ppuff By Mouth Daily 8)  Dulera 100-5 Mcg/act Aero (Mometasone Furo-Formoterol Fum) .... One Puf Two Times A Day 9)  Acyclovir 400 Mg Tabs (Acyclovir) .... One Ppo Three Times A Day As Needed Cold Sore 10)  Aspirin Ec 325 Mg Tbec (Aspirin) .... Take One Tablet By Mouth Daily 11)  Plavix 75 Mg Tabs (Clopidogrel Bisulfate) .... Take One Tablet By Mouth Daily  Current Medications (verified): 1)  Nadolol 20 Mg  Tabs (Nadolol) .... Take 1 Tablet By Mouth Two Times A Day ( Increased 05/18/2010) 2)  Pristiq 100 Mg Xr24h-Tab (Desvenlafaxine Succinate) .... One By Mouth Daly 3)  Vivelle 0.05 Mg/24hr  Pttw (Estradiol) .... Change Twice A Week 4)  Dexilant 60 Mg Cpdr (Dexlansoprazole) .... One By Mouth Daily 5)  Carafate 1 Gm Tabs (Sucralfate) .Marland Kitchen.. 1 Three Times A Day 6)  Crestor 10 Mg Tabs (Rosuvastatin Calcium) .... Take One Tablet By Mouth Daily. 7)  Spiriva Handihaler 18 Mcg Caps (Tiotropium Bromide Monohydrate) .... One Ppuff By Mouth Daily 8)  Dulera 100-5 Mcg/act Aero (Mometasone Furo-Formoterol Fum) .... One Puf Two Times A Day 9)  Acyclovir 400 Mg Tabs (Acyclovir) .... One Ppo Three Times A Day As Needed Cold Sore 10)  Aspirin Ec 325 Mg Tbec (Aspirin) .... Take One Tablet By Mouth Daily 11)  Plavix 75 Mg Tabs (Clopidogrel Bisulfate) ....  Take One Tablet By Mouth Daily  Allergies (verified): 1)  ! Codeine Sulfate (Codeine Sulfate) 2)  ! Hydrocodone-Acetaminophen (Hydrocodone-Acetaminophen) 3)  ! Augmentin (Amoxicillin-Pot Clavulanate)  Past History:  Family History: Last updated: 06/06/2007 Family History of CAD Female 1st degree relative <60 COPD  Social History: Last updated: 06/06/2007 Married Former Smoker Alcohol use-no Drug use-no  Risk Factors: Smoking Status: quit (06/09/2010)  Past medical, surgical, family and social histories (including risk factors) reviewed, and no changes noted (except as noted below).  Past Medical History: Reviewed history from 04/14/2007 and no changes required. GERD Hypertension Osteopenia menopause Barretts  Past Surgical History: Reviewed history from 04/14/2007 and no changes required. Hysterectomy--TAH Sinus surgery Tonsillectomy, adenoidectomy  Family History: Reviewed history from 06/06/2007 and no changes required. Family History of CAD Female 1st degree relative <60 COPD  Social History: Reviewed history from 06/06/2007 and no changes required. Married Former Smoker Alcohol use-no Drug use-no  Review of Systems  The patient denies anorexia, fever, weight loss, weight gain, vision loss, decreased hearing, hoarseness, chest pain, syncope, dyspnea on exertion, peripheral edema, prolonged cough, headaches, hemoptysis, abdominal pain, melena, hematochezia,  severe indigestion/heartburn, hematuria, incontinence, genital sores, muscle weakness, suspicious skin lesions, transient blindness, difficulty walking, depression, unusual weight change, abnormal bleeding, enlarged lymph nodes, angioedema, and breast masses.    Physical Exam  General:  alert and well-nourished.   Head:  normocephalic and atraumatic.   Eyes:  pupils equal and pupils round.   no hemmorrhages Ears:  R ear normal and L ear normal.   Nose:  External nasal examination shows no deformity  or inflammation. Nasal mucosa are pink and moist without lesions or exudates. Mouth:  pharynx pink and moist and no erythema.   Neck:  supple.   Lungs:  normal respiratory effort and R wheezes.   Heart:  normal rate and regular rhythm.  grade  1/6 systolic murmur.  mitral Abdomen:  Bowel sounds positive,abdomen soft and non-tender without masses, organomegaly or hernias noted. Msk:  No deformity or scoliosis noted of thoracic or lumbar spine.   Extremities:  No clubbing, cyanosis, edema, or deformity noted with normal full range of motion of all joints.   Neurologic:  alert & oriented X3 and finger-to-nose normal.     Impression & Recommendations:  Problem # 1:  MIGRAINE HEADACHE (ICD-346.90) Assessment Unchanged  Her updated medication list for this problem includes:    Nadolol 20 Mg Tabs (Nadolol) .Marland Kitchen... Take 1 tablet by mouth two times a day ( increased 05/18/2010)    Aspirin Ec 325 Mg Tbec (Aspirin) .Marland Kitchen... Take one tablet by mouth daily  Headache diary reviewed.  Problem # 2:  HYPERTENSION (ICD-401.9) Assessment: Unchanged  Her updated medication list for this problem includes:    Nadolol 20 Mg Tabs (Nadolol) .Marland Kitchen... Take 1 tablet by mouth two times a day ( increased 05/18/2010)  BP today: 132/86 Prior BP: 140/90 (05/18/2010)  Prior 10 Yr Risk Heart Disease: N/A (05/18/2010)  Labs Reviewed: K+: 4.7 (08/22/2009) Creat: : 0.8 (08/22/2009)   Chol: 210 (11/17/2009)   HDL: 71.40 (11/17/2009)   TG: 109.0 (11/17/2009)  Problem # 3:  CHRONIC OBSTRUCTIVE PULMONARY DISEASE, MILD (ICD-496)  nneds PFTs next ov Her updated medication list for this problem includes:    Spiriva Handihaler 18 Mcg Caps (Tiotropium bromide monohydrate) ..... One ppuff by mouth daily    Dulera 100-5 Mcg/act Aero (Mometasone furo-formoterol fum) ..... One puf two times a day  Vaccines Reviewed: Flu Vax: Historical (08/03/2009)  Complete Medication List: 1)  Nadolol 20 Mg Tabs (Nadolol) .... Take 1 tablet by  mouth two times a day ( increased 05/18/2010) 2)  Pristiq 100 Mg Xr24h-tab (Desvenlafaxine succinate) .... One by mouth daly 3)  Vivelle 0.05 Mg/24hr Pttw (Estradiol) .... Change twice a week 4)  Dexilant 60 Mg Cpdr (Dexlansoprazole) .... One by mouth daily 5)  Carafate 1 Gm Tabs (Sucralfate) .Marland Kitchen.. 1 three times a day 6)  Crestor 10 Mg Tabs (Rosuvastatin calcium) .... Take one tablet by mouth daily. 7)  Spiriva Handihaler 18 Mcg Caps (Tiotropium bromide monohydrate) .... One ppuff by mouth daily 8)  Dulera 100-5 Mcg/act Aero (Mometasone furo-formoterol fum) .... One puf two times a day 9)  Acyclovir 400 Mg Tabs (Acyclovir) .... One ppo three times a day as needed cold sore 10)  Aspirin Ec 325 Mg Tbec (Aspirin) .... Take one tablet by mouth daily 11)  Plavix 75 Mg Tabs (Clopidogrel bisulfate) .... Take one tablet by mouth daily  Hypertension Assessment/Plan:      The patient's hypertensive risk group is category C: Target organ damage and/or diabetes.  Today's blood pressure is 132/86.  Her blood pressure goal is < 140/90.  Patient Instructions: 1)  tale 40 of nbadolo the night before surgery and hold the AM dose on the day of surgery 2)  Please schedule a follow-up appointment in 4 months.

## 2010-10-05 NOTE — Assessment & Plan Note (Signed)
Summary: 1 MONTH ROV/NJR/pt rescd//ccm   Vital Signs:  Patient profile:   60 year old female Height:      62 inches Weight:      146 pounds BMI:     26.80 Temp:     98.2 degrees F oral Pulse rate:   72 / minute Resp:     14 per minute BP sitting:   136 / 80  (left arm)  Vitals Entered By: Willy Eddy, LPN (Jan 16, 2010 1:44 PM) CC: roa   CC:  roa.  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 Take 1 Tablet By Mouth Once A Day 2)  Pristiq 50 Mg Xr24h-Tab (Desvenlafaxine Succinate) .Marland Kitchen.. 1 Once Daily 3)  Vivelle 0.05 Mg/24hr  Pttw (Estradiol) .... Change Twice A Week 4)  Prevacid 15 Mg Cpdr (Lansoprazole) .... One By Mouth Bid 5)  Carafate 1 Gm Tabs (Sucralfate) .Marland Kitchen.. 1 Three Times A Day 6)  Vitamin D 16109 Unit  Caps (Ergocalciferol) .... One By Mouth  Weekly 7)  Crestor 20 Mg Tabs (Rosuvastatin Calcium) .... One By Mouth Weekly Every Monday Night 8)  Advair Diskus 250-50 Mcg/dose Aepb (Fluticasone-Salmeterol) .... One Puff Two Times A Day  Allergies (verified): 1)  ! Codeine Sulfate (Codeine Sulfate) 2)  ! Hydrocodone-Acetaminophen (Hydrocodone-Acetaminophen) 3)  ! Augmentin (Amoxicillin-Pot Clavulanate)  Past History:  Family History: Last updated: 06/06/2007 Family History of CAD Female 1st degree relative <60 COPD  Social History: Last updated: 06/06/2007 Married Former Smoker Alcohol use-no Drug use-no  Risk Factors: Smoking Status: quit (01/16/2010)  Past medical, surgical, family and social histories (including risk factors) reviewed, and no changes noted (except as noted below).  Past Medical History: Reviewed history from 04/14/2007 and no changes required. GERD Hypertension Osteopenia menopause Barretts  Past Surgical History: Reviewed history from 04/14/2007 and no changes required. Hysterectomy--TAH Sinus surgery Tonsillectomy, adenoidectomy  Family History: Reviewed history from 06/06/2007 and no changes required. Family History of CAD Female 1st degree relative <60 COPD  Social History: Reviewed history from 06/06/2007 and no changes required. Married Former Smoker Alcohol use-no Drug use-no  Review of Systems  The patient denies anorexia, fever, weight loss, weight gain, vision loss, decreased hearing, hoarseness, chest pain, syncope, dyspnea on exertion, peripheral edema, prolonged cough, headaches, hemoptysis, abdominal pain, melena, hematochezia, severe indigestion/heartburn, hematuria, incontinence, genital sores, muscle weakness, suspicious skin lesions, transient  blindness, difficulty walking, depression, unusual weight change, abnormal bleeding, enlarged lymph nodes, angioedema, and breast masses.    Physical Exam  General:  cachetic and pale.   Head:  normocephalic and atraumatic.    Eyes:  pupils equal and pupils round.   no hemmorrhages Ears:  R ear normal and L ear normal.   Nose:  nasal dischargemucosal pallor, mucosal edema, and airflow obstruction.   Mouth:  pharynx pink and moist and no erythema.   Neck:  nuchal rigidity and decreased ROM.   Lungs:  normal respiratory effort and no wheezes.   Heart:  normal rate and regular rhythm.     Impression & Recommendations:  Problem # 1:  CHRONIC OBSTRUCTIVE ASTHMA UNSPECIFIED (ICD-493.20)  The following medications were removed from the medication list:    Advair Diskus 250-50 Mcg/dose Aepb (Fluticasone-salmeterol) ..... One puff two times a day Her updated medication list for this problem includes:    Spiriva Handihaler 18 Mcg Caps (Tiotropium bromide monohydrate) ..... One ppuff by mouth daily    Dulera 100-5 Mcg/act Aero (Mometasone furo-formoterol fum) ..... One puf two times a day  Current Asthma Management Plan:  [place]:  SPIRIVA HANDIHALER 18 MCG CAPS SPIRIVA HANDIHALER 18 MCG CAPS:  1 inhalation once a day DULERA 100-5 MCG/ACT AERO:  1 inhalation twice a day  Problem # 2:  HERPES SIMPLEX WITHOUT MENTION OF COMPLICATION (ICD-054.9) zoviras 400 three times a day as needed sold sores  Complete Medication List: 1)  Nadolol 20 Mg Tabs (Nadolol) .... Take 1 tablet by mouth once a day 2)  Pristiq 50 Mg Xr24h-tab (Desvenlafaxine succinate) .Marland Kitchen.. 1 once daily 3)  Vivelle 0.05 Mg/24hr Pttw (Estradiol) .... Change twice a week 4)  Prevacid 15 Mg Cpdr (Lansoprazole) .... One by mouth bid 5)  Carafate 1 Gm Tabs (Sucralfate) .Marland Kitchen.. 1 three times a day 6)  Vitamin D 04540 Unit Caps (Ergocalciferol) .... One by mouth weekly 7)  Crestor 20 Mg Tabs (Rosuvastatin calcium) .... One by mouth weekly every monday night 8)  Spiriva Handihaler 18 Mcg Caps (Tiotropium bromide monohydrate) .... One ppuff by mouth daily 9)  Dulera 100-5 Mcg/act Aero (Mometasone furo-formoterol fum) .... One puf two times a day 10)   Acyclovir 400 Mg Tabs (Acyclovir) .... One ppo three times a day as needed cold sore  Asthma Management Plan    Asthma Severity: Moderate Persistent    Plan based on PEF formula: [place] Zone:SPIRIVA HANDIHALER 18 MCG CAPS SPIRIVA HANDIHALER 18 MCG CAPS:  1 inhalation once a day DULERA 100-5 MCG/ACT AERO:  1 inhalation twice a day  Patient Instructions: 1)  Please schedule a follow-up appointment in 1 month. Prescriptions: ACYCLOVIR 400 MG TABS (ACYCLOVIR) one ppo three times a day as needed cold sore  #30 x 11   Entered and Authorized by:   Stacie Glaze MD   Signed by:   Stacie Glaze MD on 01/16/2010   Method used:   Electronically to        [facility]  [phone]* (retail)       [address]       [place], [place]  [address]       Ph: [phone] or [phone]       Fax: [phone]   RxID:   [id number]

## 2010-10-05 NOTE — Letter (Signed)
Summary: Colonoscopy Letter  [facility]  [address] [place], [place] [address]   Phone: [phone]  Fax: [phone]      Jan 24, 2010 MRN: [id number]   [facility] [address] [place], [place]  [address]   Dear Ms. Akerley,   According to your medical record, it is time for you to schedule a Colonoscopy. The American Cancer Society recommends this procedure as a method to detect early colon cancer. Patients with a family history of colon cancer, or a personal history of colon polyps or inflammatory bowel disease are at increased risk.  This letter has beeen generated based on the recommendations made at the time of your procedure. If you feel that in your particular situation this may no longer apply, please contact our office.  Please call our office at [phone] to schedule this appointment or to update your records at your earliest convenience.  Thank you for cooperating with [place] to provide you with the very best care possible.   Sincerely,  Hedwig Morton. Juanda Chance, M.D.  [facility] [phone]

## 2010-10-05 NOTE — Assessment & Plan Note (Signed)
Summary: 1 month rov/njr   Vital Signs:  Patient profile:   60 year old female Height:      62 inches Weight:      150 pounds BMI:     27.53 Temp:     98.2 degrees F oral Pulse rate:   72 / minute Resp:     14 per minute BP sitting:   124 / 70  (left arm)  Vitals Entered By: Willy Eddy, LPN (March 13, 2010 4:05 PM) CC: roa after i9ncreasing pristiq to 100 qd which is helping, Hypertension Management   CC:  roa after i9ncreasing pristiq to 100 qd which is helping and Hypertension Management.  History of Present Illness: ROv for medication changes increased pristiq with improvement in mood and less tearfuness monitering of chronic problems HTN stable, HA improved and Asthms with moderate flair increased use of rescue inhailer and flairs in wheeszing reported  Hypertension History:      She denies headache, chest pain, palpitations, dyspnea with exertion, orthopnea, PND, peripheral edema, visual symptoms, neurologic problems, syncope, and side effects from treatment.        Positive major cardiovascular risk factors include female age 70 years old or older, hyperlipidemia, and hypertension.  Negative major cardiovascular risk factors include no history of diabetes, negative family history for ischemic heart disease, and non-tobacco-user status.        Further assessment for target organ damage reveals no history of ASHD, cardiac end-organ damage (CHF/LVH), stroke/TIA, peripheral vascular disease, renal insufficiency, or hypertensive retinopathy.     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) 4)  Anxiety Depression  (ICD-300.4) 5)  Chronic Obstructive Asthma Unspecified  (ICD-493.20) 6)  Acute Maxillary Sinusitis  (ICD-461.0) 7)  Migraine Headache   (ICD-346.90) 8)  Urinary Incontinence, Stress, Mild  (ICD-788.39) 9)  Chronic Obstructive Pulmonary Disease, Mild  (ICD-496) 10)  Family History of Cad Female 1st Degree Relative <60  (ICD-V16.49) 11)  Barrett's Esophagus  (ICD-530.85) 12)  Menopausal Syndrome  (ICD-627.2) 13)  Osteopenia  (ICD-733.90) 14)  Hypertension  (ICD-401.9) 15)  Gerd  (ICD-530.81)  Current Problems (verified): 1)  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) 2)  Preventive Health Care  (ICD-V70.0) 3)  Hyperlipidemia, Mild  (ICD-272.4) 4)  Anxiety Depression  (ICD-300.4) 5)  Chronic Obstructive Asthma Unspecified  (ICD-493.20) 6)  Acute Maxillary Sinusitis  (ICD-461.0) 7)  Migraine Headache  (ICD-346.90) 8)  Urinary Incontinence, Stress, Mild  (ICD-788.39) 9)  Chronic Obstructive Pulmonary Disease, Mild  (ICD-496) 10)  Family History of Cad Female 1st Degree Relative <60  (ICD-V16.49) 11)  Barrett's Esophagus  (ICD-530.85) 12)  Menopausal Syndrome  (ICD-627.2) 13)  Osteopenia  (ICD-733.90) 14)  Hypertension  (ICD-401.9) 15)  Gerd  (ICD-530.81)  Medications Prior to Update: 1)  Nadolol 20 Mg  Tabs (Nadolol) .... Take 1 Tablet By Mouth Once A Day 2)  Pristiq 50 Mg Xr24h-Tab (Desvenlafaxine Succinate) .Marland Kitchen.. 1 Once Daily 3)  Vivelle 0.05 Mg/24hr  Pttw (Estradiol) .... Change Twice A Week 4)  Dexilant 60 Mg Cpdr (Dexlansoprazole) .... One By Mouth Daily 5)  Carafate 1 Gm Tabs (Sucralfate) .Marland Kitchen.. 1 Three Times A Day 6)  Vitamin D 16109 Unit  Caps (Ergocalciferol) .... One By Mouth Weekly 7)  Crestor 20 Mg Tabs (Rosuvastatin Calcium) .... One  By Mouth Weekly Every Monday Night 8)  Spiriva Handihaler 18 Mcg Caps (Tiotropium Bromide Monohydrate) .... One Ppuff By Mouth Daily 9)  Dulera 100-5 Mcg/act Aero (Mometasone Furo-Formoterol Fum) .... One Puf Two Times A Day 10)  Acyclovir 400 Mg Tabs (Acyclovir) .... One Ppo Three Times A Day As Needed Cold Sore  Current Medications (verified): 1)  Nadolol  20 Mg  Tabs (Nadolol) .... Take 1 Tablet By Mouth Once A Day 2)  Pristiq 100 Mg Xr24h-Tab (Desvenlafaxine Succinate) .... One By Mouth Daly 3)  Vivelle 0.05 Mg/24hr  Pttw (Estradiol) .... Change Twice A Week 4)  Dexilant 60 Mg Cpdr (Dexlansoprazole) .... One By Mouth Daily 5)  Carafate 1 Gm Tabs (Sucralfate) .Marland Kitchen.. 1 Three Times A Day 6)  Vitamin D 16109 Unit  Caps (Ergocalciferol) .... One By Mouth Weekly 7)  Crestor 20 Mg Tabs (Rosuvastatin Calcium) .... One By Mouth Weekly Every Monday Night 8)  Spiriva Handihaler 18 Mcg Caps (Tiotropium Bromide Monohydrate) .... One Ppuff By Mouth Daily 9)  Dulera 100-5 Mcg/act Aero (Mometasone Furo-Formoterol Fum) .... One Puf Two Times A Day 10)  Acyclovir 400 Mg Tabs (Acyclovir) .... One Ppo Three Times A Day As Needed Cold Sore  Allergies (verified): 1)  ! Codeine Sulfate (Codeine Sulfate) 2)  ! Hydrocodone-Acetaminophen (Hydrocodone-Acetaminophen) 3)  ! Augmentin (Amoxicillin-Pot Clavulanate)  Past History:  Family History: Last updated: 06/06/2007 Family History of CAD Female 1st degree relative <60 COPD  Social History: Last updated: 06/06/2007 Married Former Smoker Alcohol use-no Drug use-no  Risk Factors: Smoking Status: quit (03/13/2010)  Past medical, surgical, family and social histories (including risk factors) reviewed, and no changes noted (except as noted below).  Past Medical History: Reviewed history from 04/14/2007 and no changes required. GERD Hypertension Osteopenia menopause Barretts  Past Surgical History: Reviewed history from 04/14/2007 and no changes required. Hysterectomy--TAH Sinus surgery Tonsillectomy, adenoidectomy  Family History: Reviewed history from 06/06/2007 and no changes required. Family History of CAD Female 1st degree relative <60 COPD  Social History: Reviewed history from 06/06/2007 and no changes required. Married Former Smoker Alcohol use-no Drug use-no  Review of  Systems  The patient denies anorexia, fever, weight loss, weight gain, vision loss, decreased hearing, hoarseness, chest pain, syncope, dyspnea on exertion, peripheral edema, prolonged cough, headaches, hemoptysis, abdominal pain, melena, hematochezia, severe indigestion/heartburn, hematuria, incontinence, genital sores, muscle weakness, suspicious skin lesions, transient blindness, difficulty walking, depression, unusual weight change, abnormal bleeding, enlarged lymph nodes, angioedema, and breast masses.    Physical Exam  General:  alert and well-nourished.   Head:  normocephalic and atraumatic.   Eyes:  pupils equal and pupils round.   no hemmorrhages Ears:  R ear normal and L ear normal.   Mouth:  pharynx pink and moist and no erythema.   Neck:  supple.   Lungs:  normal respiratory effort and R wheezes.   Heart:  normal rate and regular rhythm.   Abdomen:  Bowel sounds positive,abdomen soft and non-tender without masses, organomegaly or hernias noted. Msk:  No deformity or scoliosis noted of thoracic or lumbar spine.   Neurologic:  alert & oriented X3 and finger-to-nose normal.     Impression & Recommendations:  Problem # 1:  ANXIETY DEPRESSION (ICD-300.4) increaed the pristique to 100 with good result  Problem # 2:  CHRONIC OBSTRUCTIVE ASTHMA UNSPECIFIED (ICD-493.20) increase the dulera to 200 for one month wit sampls due to flair Her updated medication list for this problem includes:    Spiriva Handihaler 18  Mcg Caps (Tiotropium bromide monohydrate) ..... One ppuff by mouth daily    Dulera 100-5 Mcg/act Aero (Mometasone furo-formoterol fum) ..... One puf two times a day  Current Asthma Management Plan:  [place]:  SPIRIVA HANDIHALER 18 MCG CAPS SPIRIVA HANDIHALER 18 MCG CAPS:  1 inhalation once a day DULERA 100-5 MCG/ACT AERO:  1 inhalation twice a day  Problem # 3:  HYPERTENSION (ICD-401.9) Assessment: Unchanged  Her updated medication list for this problem  includes:    Nadolol 20 Mg Tabs (Nadolol) .Marland Kitchen... Take 1 tablet by mouth once a day  BP today: 124/70 Prior BP: 140/80 (02/13/2010)  Prior 10 Yr Risk Heart Disease: Not enough information (07/23/2007)  Labs Reviewed: K+: 4.7 (08/22/2009) Creat: : 0.8 (08/22/2009)   Chol: 210 (11/17/2009)   HDL: 71.40 (11/17/2009)   TG: 109.0 (11/17/2009)  Problem # 4:  GERD (ICD-530.81) Assessment: Unchanged  Her updated medication list for this problem includes:    Dexilant 60 Mg Cpdr (Dexlansoprazole) ..... One by mouth daily    Carafate 1 Gm Tabs (Sucralfate) .Marland Kitchen... 1 three times a day  Labs Reviewed: Hgb: 13.0 (08/22/2009)   Hct: 38.9 (08/22/2009)  Complete Medication List: 1)  Nadolol 20 Mg Tabs (Nadolol) .... Take 1 tablet by mouth once a day 2)  Pristiq 100 Mg Xr24h-tab (Desvenlafaxine succinate) .... One by mouth daly 3)  Vivelle 0.05 Mg/24hr Pttw (Estradiol) .... Change twice a week 4)  Dexilant 60 Mg Cpdr (Dexlansoprazole) .... One by mouth daily 5)  Carafate 1 Gm Tabs (Sucralfate) .Marland Kitchen.. 1 three times a day 6)  Vitamin D 04540 Unit Caps (Ergocalciferol) .... One by mouth weekly 7)  Crestor 20 Mg Tabs (Rosuvastatin calcium) .... One by mouth weekly every monday night 8)  Spiriva Handihaler 18 Mcg Caps (Tiotropium bromide monohydrate) .... One ppuff by mouth daily 9)  Dulera 100-5 Mcg/act Aero (Mometasone furo-formoterol fum) .... One puf two times a day 10)  Acyclovir 400 Mg Tabs (Acyclovir) .... One ppo three times a day as needed cold sore  Hypertension Assessment/Plan:      The patient's hypertensive risk group is category B: At least one risk factor (excluding diabetes) with no target organ damage.  Today's blood pressure is 124/70.  Her blood pressure goal is < 140/90.  Patient Instructions: 1)  increased the dulera to samples for 1 month 2)  Please schedule a follow-up appointment in 2 months. Prescriptions: PRISTIQ 100 MG XR24H-TAB (DESVENLAFAXINE SUCCINATE) one by mouth  daly  #30 x 5   Entered and Authorized by:   Stacie Glaze MD   Signed by:   Stacie Glaze MD on 03/13/2010   Method used:   Electronically to        [facility]  [phone]* (retail)       [address]       [place], [place]  [address]       Ph: [phone] or [phone]       Fax: [phone]   RxID:   [id number]

## 2010-10-05 NOTE — Progress Notes (Signed)
  Recieved Pt FMLA papers via Fax from [employer], sent to [employer]. Katherine Foley  April 24, 2010 1:33 PM     Appended Document:  Called patient [phone]) concerning need for Release form to be sent to [employer] to have her form completed. Patient will fax today August 29th. Failed to append chart on the date of the call.

## 2010-10-05 NOTE — Progress Notes (Signed)
Summary: chest pain  Phone Note Call from Patient   Caller: Patient Call For: Stacie Glaze MD Summary of Call: Pt is having chest pain since last night with pain to both arms.  No relief with rest.  Will go to [facility] ER ASAP for evaluation. Initial call taken by: Lynann Beaver CMA,  April 19, 2010 9:10 AM  Follow-up for Phone Call        dr Lovell Sheehan is aware Follow-up by: Willy Eddy, LPN,  April 19, 2010 11:17 AM

## 2010-10-05 NOTE — Progress Notes (Signed)
Summary: Pt req script for Advair 250mg . Out of Samples  Phone Note Call from Patient Call back at [facility] Phone [phone]   Caller: Patient Summary of Call: Pt called and is out of samples of Advair 250mg . Pt is req a script be called in to [facility] on [address].  Initial call taken by: Lucy Antigua,  Jan 05, 2010 10:45 AM    Prescriptions: ADVAIR DISKUS 250-50 MCG/DOSE AEPB (FLUTICASONE-SALMETEROL) one puff two times a day  #1 x 6   Entered by:   Willy Eddy, LPN   Authorized by:   Stacie Glaze MD   Signed by:   Willy Eddy, LPN on 21/30/8657   Method used:   Electronically to        [facility]  [phone]* (retail)       [address]       [place], [place]  [address]       Ph: [phone] or [phone]       Fax: [phone]   RxID:   [id number]

## 2010-10-05 NOTE — Assessment & Plan Note (Signed)
Summary: 2 month rov Katherine Foley [facility] PER DOC/NJR   Vital Signs:  Patient profile:   60 year old female Height:      62 inches Weight:      146 pounds BMI:     26.80 Temp:     98.2 degrees F oral Pulse rate:   68 / minute Resp:     14 per minute BP sitting:   140 / 90  (left arm)  Vitals Entered By: Willy Eddy, LPN (May 18, 2010 1:00 PM) CC: roa- had mi with stent placement 8-19-med changes, Hypertension Management Is Patient Diabetic? No   Primary Care Provider:  Stacie Glaze MD  CC:  roa- had mi with stent placement 8-19-med changes and Hypertension Management.  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  The bleeding index is 'low risk'.  Positive CHADS2 values include History of HTN.  Negative CHADS2 values include Age > 66 years old, History of Diabetes, and Prior Stroke/CVA/TIA.    Hypertension History:      She complains of chest pain, but denies headache, palpitations, dyspnea with exertion, orthopnea, PND, peripheral edema, visual symptoms, neurologic problems, syncope, and side effects from treatment.  dull chest ache.        Positive major cardiovascular risk factors include female age 32 years old or older, hyperlipidemia, and hypertension.  Negative major cardiovascular risk factors include no history of diabetes, negative family history for ischemic heart disease, and non-tobacco-user status.        Positive history for target organ damage include ASHD (either  angina/prior MI/prior CABG).  Further assessment for target organ damage reveals no history of cardiac end-organ damage (CHF/LVH), stroke/TIA, peripheral vascular disease, renal insufficiency, or hypertensive retinopathy.     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) 7)  Anxiety Depression  (ICD-300.4) 8)  Chronic Obstructive Asthma Unspecified  (ICD-493.20) 9)  Acute Maxillary Sinusitis  (ICD-461.0) 10)  Migraine Headache  (ICD-346.90) 11)  Urinary Incontinence, Stress, Mild  (ICD-788.39) 12)  Chronic Obstructive Pulmonary Disease, Mild  (ICD-496) 13)  Family History of Cad Female 1st Degree Relative <60  (ICD-V16.49) 14)  Barrett's Esophagus  (ICD-530.85) 15)  Menopausal Syndrome  (ICD-627.2) 16)  Osteopenia  (ICD-733.90) 17)  Hypertension  (ICD-401.9) 18)  Gerd  (ICD-530.81)  Current Problems (verified): 1)  Cad, Native Vessel  (ICD-414.01) 2)  Undiagnosed Cardiac Murmurs  (ICD-785.2) 3)  Carotid Bruits, 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) 4)  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) 5)  Preventive Health Care  (ICD-V70.0) 6)  Hyperlipidemia, Mild  (ICD-272.4) 7)  Anxiety Depression  (ICD-300.4) 8)  Chronic Obstructive Asthma Unspecified  (ICD-493.20) 9)  Acute Maxillary Sinusitis  (ICD-461.0) 10)  Migraine Headache  (ICD-346.90) 11)  Urinary Incontinence, Stress, Mild  (ICD-788.39) 12)  Chronic Obstructive Pulmonary Disease, Mild  (ICD-496) 13)  Family History of Cad Female 1st Degree Relative <60  (ICD-V16.49) 14)  Barrett's Esophagus  (ICD-530.85) 15)   Menopausal Syndrome  (ICD-627.2) 16)  Osteopenia  (ICD-733.90) 17)  Hypertension  (ICD-401.9) 18)  Gerd  (ICD-530.81)  Medications Prior to Update: 1)  Nadolol 20 Mg  Tabs (Nadolol) .... Take 1 Tablet By Mouth Once A Day 2)  Pristiq 100 Mg Xr24h-Tab (Desvenlafaxine Succinate) .... One By Mouth Daly 3)  Vivelle 0.05 Mg/24hr  Pttw (Estradiol) .... Change Twice A Week 4)  Dexilant 60 Mg Cpdr (Dexlansoprazole) .... One By Mouth Daily 5)  Carafate 1 Gm Tabs (Sucralfate) .Marland Kitchen.. 1 Three Times A Day 6)  Crestor 10 Mg Tabs (Rosuvastatin Calcium) .... Take One Tablet By Mouth Daily. 7)  Spiriva Handihaler 18 Mcg Caps (Tiotropium Bromide Monohydrate) .... One Ppuff By Mouth Daily 8)  Dulera 100-5 Mcg/act Aero (Mometasone Furo-Formoterol Fum) .... One Puf Two Times A Day 9)  Acyclovir 400 Mg Tabs (Acyclovir) .... One Ppo Three Times A Day As Needed Cold Sore 10)  Aspirin Ec 325 Mg Tbec (Aspirin) .... Take One Tablet By Mouth Daily 11)  Plavix 75 Mg Tabs (Clopidogrel Bisulfate) .... Take One Tablet By Mouth Daily  Current Medications (verified): 1)  Nadolol 20 Mg  Tabs (Nadolol) .... Take 1 Tablet By Mouth Two Times A Day ( Increased 05/18/2010) 2)  Pristiq 100 Mg Xr24h-Tab (Desvenlafaxine Succinate) .... One By Mouth Daly 3)  Vivelle 0.05 Mg/24hr  Pttw (Estradiol) .... Change Twice A Week 4)  Dexilant 60 Mg Cpdr (Dexlansoprazole) .... One By Mouth Daily 5)  Carafate 1 Gm Tabs (Sucralfate) .Marland Kitchen.. 1 Three Times A Day 6)  Crestor 10 Mg Tabs (Rosuvastatin Calcium) .... Take One Tablet By Mouth Daily. 7)  Spiriva Handihaler 18 Mcg Caps (Tiotropium Bromide Monohydrate) .... One Ppuff By Mouth Daily 8)  Dulera 100-5 Mcg/act Aero (Mometasone Furo-Formoterol Fum) .... One Puf Two Times A Day 9)  Acyclovir 400 Mg Tabs (Acyclovir) .... One Ppo Three Times A Day As Needed Cold Sore 10)  Aspirin Ec 325 Mg Tbec (Aspirin) .... Take One Tablet By Mouth Daily 11)  Plavix 75 Mg Tabs (Clopidogrel Bisulfate) .... Take One  Tablet By Mouth Daily  Allergies (verified): 1)  ! Codeine Sulfate (Codeine Sulfate) 2)  ! Hydrocodone-Acetaminophen (Hydrocodone-Acetaminophen) 3)  ! Augmentin (Amoxicillin-Pot Clavulanate)  Past History:  Family History: Last updated: 06/06/2007 Family History of CAD Female 1st degree relative <60 COPD  Social History: Last updated: 06/06/2007 Married Former Smoker Alcohol use-no Drug use-no  Risk Factors: Smoking Status: quit (05/18/2010)  Past medical, surgical, family and social histories (including risk factors) reviewed, and no changes noted (except as noted below).  Past Medical History: Reviewed history from 04/14/2007 and no changes required. GERD Hypertension Osteopenia menopause Barretts  Past Surgical History: Reviewed history from 04/14/2007 and no changes required. Hysterectomy--TAH Sinus surgery Tonsillectomy, adenoidectomy  Family History: Reviewed history from 06/06/2007 and no changes required. Family History of CAD Female 1st degree relative <60 COPD  Social History: Reviewed history from 06/06/2007 and no changes required. Married Former Smoker Alcohol use-no Drug use-no  Review of Systems  The patient denies anorexia, fever, weight loss, weight gain, vision loss, decreased hearing, hoarseness, chest pain, syncope, dyspnea on exertion, peripheral edema, prolonged cough, headaches, hemoptysis, abdominal pain, melena, hematochezia, severe indigestion/heartburn, hematuria, incontinence, genital sores, muscle weakness, suspicious skin lesions, transient blindness, difficulty walking, depression, unusual weight change, abnormal bleeding, enlarged lymph nodes, angioedema, and breast masses.    Contraindications/Deferment of Procedures/Staging:    Test/Procedure: PSA    Reason for deferment: patient declined     Test/Procedure: FLU VAX    Reason for deferment: patient declined    Physical Exam  General:  alert and well-nourished.     Head:  normocephalic and atraumatic.   Eyes:  pupils equal and pupils round.   no hemmorrhages Ears:  R ear normal and L ear normal.   Nose:  External nasal examination shows no deformity or inflammation. Nasal mucosa are pink and moist without lesions or exudates. Neck:  supple.   Lungs:  normal respiratory effort and R wheezes.   Heart:  normal rate and regular rhythm.  grade  1/6 systolic murmur.  mitral Abdomen:  Bowel sounds positive,abdomen soft and non-tender without masses, organomegaly or hernias noted.   Impression & Recommendations:  Problem # 1:  CAD, NATIVE VESSEL (ICD-414.01) she had MI in hospital and one vs dz feels better with mild persistant  chest ache  crestor increased Her updated medication list for this problem includes:    Nadolol 20 Mg Tabs (Nadolol) .Marland Kitchen... Take 1 tablet by mouth two times a day ( increased 05/18/2010)    Aspirin Ec 325 Mg Tbec (Aspirin) .Marland Kitchen... Take one tablet by mouth daily    Plavix 75 Mg Tabs (Clopidogrel bisulfate) .Marland Kitchen... Take one tablet by mouth daily  Labs Reviewed: Chol: 210 (11/17/2009)   HDL: 71.40 (11/17/2009)   TG: 109.0 (11/17/2009)  Lipid Goals: Chol Goal: 200 (11/28/2009)   HDL Goal: 40 (11/28/2009)   LDL Goal: 160 (11/28/2009)   TG Goal: 150 (11/28/2009)  Problem # 2:  HYPERLIPIDEMIA, MILD (ICD-272.4) at goal Her updated medication list for this problem includes:    Crestor 10 Mg Tabs (Rosuvastatin calcium) .Marland Kitchen... Take one tablet by mouth daily.  Labs Reviewed: SGOT: 17 (11/17/2009)   SGPT: 17 (11/17/2009)  Lipid Goals: Chol Goal: 200 (11/28/2009)   HDL Goal: 40 (11/28/2009)   LDL Goal: 160 (11/28/2009)   TG Goal: 150 (11/28/2009)  Prior 10 Yr Risk Heart Disease: Not enough information (07/23/2007)   HDL:71.40 (11/17/2009), 54.20 (08/22/2009)  Chol:210 (11/17/2009), 268 (08/22/2009)  Trig:109.0 (11/17/2009), 109.0 (08/22/2009)  Problem # 3:  GERD (ICD-530.81)  Her updated medication list for this problem includes:     Dexilant 60 Mg Cpdr (Dexlansoprazole) ..... One by mouth daily    Carafate 1 Gm Tabs (Sucralfate) .Marland Kitchen... 1 three times a day  Labs Reviewed: Hgb: 13.0 (08/22/2009)   Hct: 38.9 (08/22/2009)  Problem # 4:  UNDIAGNOSED CARDIAC MURMURS (ICD-785.2)  the murmur sound like mitral insufficiency agree with the echo afte known MI EKG obtained (see interpretation); Will refer to cardiology for evaluation of this murmur.   Problem # 5:  HYPERTENSION (ICD-401.9)  Her updated medication list for this problem includes:    Nadolol 20 Mg Tabs (Nadolol) .Marland Kitchen... Take 1 tablet by mouth two times a day ( increased 05/18/2010)  BP today: 152/90 Prior BP: 144/93 (05/17/2010)  Prior 10 Yr Risk Heart Disease: Not enough information (07/23/2007)  Labs Reviewed: K+: 4.7 (08/22/2009) Creat: : 0.8 (08/22/2009)   Chol: 210 (11/17/2009)  HDL: 71.40 (11/17/2009)   TG: 109.0 (11/17/2009)  Complete Medication List: 1)  Nadolol 20 Mg Tabs (Nadolol) .... Take 1 tablet by mouth two times a day ( increased 05/18/2010) 2)  Pristiq 100 Mg Xr24h-tab (Desvenlafaxine succinate) .... One by mouth daly 3)  Vivelle 0.05 Mg/24hr Pttw (Estradiol) .... Change twice a week 4)  Dexilant 60 Mg Cpdr (Dexlansoprazole) .... One by mouth daily 5)  Carafate 1 Gm Tabs (Sucralfate) .Marland Kitchen.. 1 three times a day 6)  Crestor 10 Mg Tabs (Rosuvastatin calcium) .... Take one tablet by mouth daily. 7)  Spiriva Handihaler 18 Mcg Caps (Tiotropium bromide monohydrate) .... One ppuff by mouth daily 8)  Dulera 100-5 Mcg/act Aero (Mometasone furo-formoterol fum) .... One puf two times a day 9)  Acyclovir 400 Mg Tabs (Acyclovir) .... One ppo three times a day as needed cold sore 10)  Aspirin Ec 325 Mg Tbec (Aspirin) .... Take one tablet by mouth daily 11)  Plavix 75 Mg Tabs (Clopidogrel bisulfate) .... Take one tablet by mouth daily  Anticoagulation Management Assessment/Plan:             Hypertension Assessment/Plan:      The patient's hypertensive  risk group is category C: Target organ damage and/or diabetes.  Today's blood pressure is 140/90.  Her blood pressure goal is < 140/90.  Patient Instructions: 1)  Please schedule a follow-up appointment in 3 weeks. Prescriptions: NADOLOL 20 MG  TABS (NADOLOL) Take 1 tablet by mouth two times a day ( increased 05/18/2010)  #60 x 11   Entered and Authorized by:   Stacie Glaze MD   Signed by:   Stacie Glaze MD on 05/18/2010   Method used:   Electronically to        [facility]  [phone]* (retail)       [address]       [place], [place]  [address]       Ph: [phone] or [phone]       Fax: [phone]   RxID:   [id number]

## 2010-10-05 NOTE — Assessment & Plan Note (Signed)
Summary: rov/sl   Primary Stefan Markarian:  Stacie Glaze MD   History of Present Illness: Katherine Foley is a 60 year oldwho was admitted to the hospital with a non-ST elevation MI April 19, 2010. This was treated with a bare-metal stent in to the RCA. She has residual 50-60% circumflex. There was also mild reduction in LV function with inferior wall motion abnormality. Her hospitalization was complicated by an ovarian cyst for which she had a CT scan for her she is a very large pelvic ovarian cyst in which she will probably need laparoscopic resection in the next 3 months. Also has h/o COPD.   Saw Herma Carson in September and was still having some mild CP. Returns for f/u. Doing very well. Very active at work with no CP or undue dyspnea. Only complaint is severe bruising on ASA/Plavix.  Scheduled to have her ovarian cyst resected next week. Dr. Lovell Sheehan following BP and cholesterol and recently increased Nadolol.   ECHO today EF 55-60%. mild to moderate AS (mean gradient 19mm HG)     Current Medications (verified): 1)  Nadolol 20 Mg  Tabs (Nadolol) .... Take 1 Tablet By Mouth Two Times A Day ( Increased 05/18/2010) 2)  Pristiq 100 Mg Xr24h-Tab (Desvenlafaxine Succinate) .... One By Mouth Daly 3)  Vivelle 0.05 Mg/24hr  Pttw (Estradiol) .... Change Twice A Week 4)  Dexilant 60 Mg Cpdr (Dexlansoprazole) .... One By Mouth Daily 5)  Carafate 1 Gm Tabs (Sucralfate) .Marland Kitchen.. 1 Three Times A Day 6)  Crestor 10 Mg Tabs (Rosuvastatin Calcium) .... Take One Tablet By Mouth Daily. 7)  Spiriva Handihaler 18 Mcg Caps (Tiotropium Bromide Monohydrate) .... One Ppuff By Mouth Daily 8)  Dulera 100-5 Mcg/act Aero (Mometasone Furo-Formoterol Fum) .... One Puf Two Times A Day 9)  Acyclovir 400 Mg Tabs (Acyclovir) .... One Ppo Three Times A Day As Needed Cold Sore 10)  Aspirin Ec 325 Mg Tbec (Aspirin) .... Hold 11)  Plavix 75 Mg Tabs (Clopidogrel Bisulfate) .... Take One Tablet By Mouth Daily  Allergies (verified): 1)  !  Codeine Sulfate (Codeine Sulfate) 2)  ! Hydrocodone-Acetaminophen (Hydrocodone-Acetaminophen) 3)  ! Augmentin (Amoxicillin-Pot Clavulanate)  Past History:  Past Medical History: Last updated: 04/14/2007 GERD Hypertension Osteopenia menopause Barretts  Review of Systems       As per HPI and past medical history; otherwise all systems negative.   Vital Signs:  Patient profile:   60 year old female Height:      62 inches Weight:      147 pounds BMI:     26.98 Pulse rate:   56 / minute Resp:     16 per minute BP sitting:   142 / 78  (left arm)  Vitals Entered By: Marrion Coy, CNA (July 06, 2010 11:42 AM)   Impression & Recommendations:  Problem # 1:  CAD, NATIVE VESSEL (ICD-414.01) Stable. No evidence of ischemia. Given bare metal stent, OK to come off Plavix now particularly in light of upcoming surgery. Can stop ASA for surgery now as well but resume immediately post-op.   Problem # 2:  HYPERLIPIDEMIA, MILD (ICD-272.4) LDL not at goal. (< 70). Increase Crestor to 20.   Problem # 3:  AORTIC STENOSIS (ICD-424.1) Mild to moderate. Asymptomatic. Continue to follow.  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 U/s pending.   Patient Instructions: 1)  Your physician recommends that you schedule a follow-up appointment in: 6 months  2)  Your physician has recommended you make the following change in your medication: Increase Crestor to 20 mg by mouth daily. 3)  Your physician recommends that you return for a FASTING lipid profile and complete metablolic profile tomorrow. The lab opens at 8:30 Prescriptions: CRESTOR 20 MG TABS (ROSUVASTATIN CALCIUM) Take one tablet by mouth daily.  #30 x 11   Entered by:   Dossie Arbour, RN, BSN   Authorized by:   Dolores Patty, MD, [facility]   Signed by:   Dossie Arbour, RN, BSN on 07/06/2010   Method used:   Electronically to         [facility]  [phone]* (retail)       [address]       [place], [place]  [address]       Ph: [phone] or [phone]       Fax: [phone]   RxID:   [id number]

## 2010-10-05 NOTE — Letter (Signed)
Summary: [facility] Office Visit Note   [facility] Office Visit Note   Imported By: Roderic Ovens 06/16/2010 12:35:15  _____________________________________________________________________  External Attachment:    Type:   Image     Comment:   External Document

## 2010-10-05 NOTE — Assessment & Plan Note (Signed)
Summary: 1 month rov/njr   Vital Signs:  Patient profile:   60 year old female Height:      62 inches Weight:      149 pounds BMI:     27.35 Temp:     98.2 degrees F oral Pulse rate:   72 / minute Resp:     14 per minute BP sitting:   140 / 80  (left arm)  Vitals Entered By: Willy Eddy, LPN (February 13, 2010 3:26 PM)  Nutrition Counseling: Patient's BMI is greater than 25 and therefore counseled on weight management options. CC: roa, Hypertension Management   CC:  roa and Hypertension Management.  History of Present Illness: overwhelmed with work ad the fact that her daughter has cancer asthma has been stable blood pressures has been good has increased GERD and has chest pain and pain in both arms no diaphoresis and no sob  Hypertension History:      She denies headache, chest pain, palpitations, dyspnea with exertion, orthopnea, PND, peripheral edema, visual symptoms, neurologic problems, syncope, and side effects from treatment.        Positive major cardiovascular risk factors include female age 72 years old or older, hyperlipidemia, and hypertension.  Negative major cardiovascular risk factors include no history of diabetes, negative family history for ischemic heart disease, and non-tobacco-user status.        Further assessment for target organ damage reveals no history of ASHD, cardiac end-organ damage (CHF/LVH), stroke/TIA, peripheral vascular disease, renal insufficiency, or hypertensive retinopathy.     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) 2)  Preventive Health Care  (ICD-V70.0) 3)  Hyperlipidemia, Mild  (ICD-272.4) 4)  Anxiety Depression  (ICD-300.4) 5)  Chronic Obstructive Asthma Unspecified  (ICD-493.20) 6)  Acute Maxillary Sinusitis  (ICD-461.0) 7)  Migraine Headache  (ICD-346.90) 8)  Urinary Incontinence,  Stress, Mild  (ICD-788.39) 9)  Chronic Obstructive Pulmonary Disease, Mild  (ICD-496) 10)  Family History of Cad Female 1st Degree Relative <60  (ICD-V16.49) 11)  Barrett's Esophagus  (ICD-530.85) 12)  Menopausal Syndrome  (ICD-627.2) 13)  Osteopenia  (ICD-733.90) 14)  Hypertension  (ICD-401.9) 15)  Gerd  (ICD-530.81)  Current Medications (verified): 1)  Nadolol 20 Mg  Tabs (Nadolol) .... Take 1 Tablet By Mouth Once A Day 2)  Pristiq 50 Mg Xr24h-Tab (Desvenlafaxine Succinate) .Marland Kitchen.. 1 Once Daily 3)  Vivelle 0.05 Mg/24hr  Pttw (Estradiol) .... Change Twice A Week 4)  Dexilant 60 Mg Cpdr (Dexlansoprazole) .... One By Mouth Daily 5)  Carafate 1 Gm Tabs (Sucralfate) .Marland Kitchen.. 1 Three Times A Day 6)  Vitamin D 16109 Unit  Caps (Ergocalciferol) .... One By Mouth Weekly 7)  Crestor 20 Mg Tabs (Rosuvastatin Calcium) .... One By Mouth Weekly Every Monday Night 8)  Spiriva Handihaler 18 Mcg Caps (Tiotropium Bromide Monohydrate) .... One Ppuff By Mouth Daily 9)  Dulera 100-5 Mcg/act Aero (Mometasone Furo-Formoterol Fum) .... One Puf Two Times A Day 10)  Acyclovir 400 Mg Tabs (Acyclovir) .... One Ppo Three Times A Day As Needed Cold Sore  Allergies (verified): 1)  ! Codeine Sulfate (Codeine Sulfate) 2)  ! Hydrocodone-Acetaminophen (Hydrocodone-Acetaminophen) 3)  ! Augmentin (Amoxicillin-Pot Clavulanate)  Past History:  Family History: Last updated: 06/06/2007 Family History of CAD Female 1st degree relative <60 COPD  Social History: Last updated: 06/06/2007 Married Former Smoker Alcohol use-no Drug use-no  Risk Factors: Smoking Status:  quit (02/13/2010)  Past medical, surgical, family and social histories (including risk factors) reviewed, and no changes noted (except as noted below).  Past Medical History: Reviewed history from 04/14/2007 and no changes required. GERD Hypertension Osteopenia menopause Barretts  Past Surgical History: Reviewed history from 04/14/2007 and no changes  required. Hysterectomy--TAH Sinus surgery Tonsillectomy, adenoidectomy  Family History: Reviewed history from 06/06/2007 and no changes required. Family History of CAD Female 1st degree relative <60 COPD  Social History: Reviewed history from 06/06/2007 and no changes required. Married Former Smoker Alcohol use-no Drug use-no  Review of Systems  The patient denies anorexia, fever, weight loss, weight gain, vision loss, decreased hearing, hoarseness, chest pain, syncope, dyspnea on exertion, peripheral edema, prolonged cough, headaches, hemoptysis, abdominal pain, melena, hematochezia, severe indigestion/heartburn, hematuria, incontinence, genital sores, muscle weakness, suspicious skin lesions, transient blindness, difficulty walking, depression, unusual weight change, abnormal bleeding, enlarged lymph nodes, angioedema, breast masses, and testicular masses.    Physical Exam  General:  cachetic and pale.   Head:  normocephalic and atraumatic.   Eyes:  pupils equal and pupils round.   no hemmorrhages Ears:  R ear normal and L ear normal.   Nose:  nasal dischargemucosal pallor, mucosal edema, and airflow obstruction.   Mouth:  pharynx pink and moist and no erythema.   Neck:  nuchal rigidity and decreased ROM.   Lungs:  normal respiratory effort and no wheezes.   Heart:  normal rate and regular rhythm.   Abdomen:  soft, non-tender, and normal bowel sounds.   Msk:  no joint swelling, no joint warmth, and no redness over joints.   Extremities:  No clubbing, cyanosis, edema, or deformity noted with normal full range of motion of all joints.   Neurologic:  No cranial nerve deficits noted. Station and gait are normal. Plantar reflexes are down-going bilaterally. DTRs are symmetrical throughout. Sensory, motor and coordinative functions appear intact.   Impression & Recommendations:  Problem # 1:  BARRETT'S ESOPHAGUS (ICD-530.85) on the prevacid  two in the AM  Problem # 2:  CHRONIC  OBSTRUCTIVE PULMONARY DISEASE, MILD (ICD-496) samples Her updated medication list for this problem includes:    Spiriva Handihaler 18 Mcg Caps (Tiotropium bromide monohydrate) ..... One ppuff by mouth daily    Dulera 100-5 Mcg/act Aero (Mometasone furo-formoterol fum) ..... One puf two times a day  Vaccines Reviewed: Flu Vax: Historical (08/03/2009)  Problem # 3:  MENOPAUSAL SYNDROME (ICD-627.2)  increase the pristiq to 100 Her updated medication list for this problem includes:    Vivelle 0.05 Mg/24hr Pttw (Estradiol) .Marland Kitchen... Change twice a week  Discussed treatment options.   Complete Medication List: 1)  Nadolol 20 Mg Tabs (Nadolol) .... Take 1 tablet by mouth once a day 2)  Pristiq 50 Mg Xr24h-tab (Desvenlafaxine succinate) .Marland Kitchen.. 1 once daily 3)  Vivelle 0.05 Mg/24hr Pttw (Estradiol) .... Change twice a week 4)  Dexilant 60 Mg Cpdr (Dexlansoprazole) .... One by mouth daily 5)  Carafate 1 Gm Tabs (Sucralfate) .Marland Kitchen.. 1 three times a day 6)  Vitamin D 09811 Unit Caps (Ergocalciferol) .... One by mouth weekly 7)  Crestor 20 Mg Tabs (Rosuvastatin calcium) .... One by mouth weekly every monday night 8)  Spiriva Handihaler 18 Mcg Caps (Tiotropium bromide monohydrate) .... One ppuff by mouth daily 9)  Dulera 100-5 Mcg/act Aero (Mometasone furo-formoterol fum) .... One puf two times a day 10)  Acyclovir 400 Mg Tabs (Acyclovir) .... One ppo three times a day as needed cold sore  Hypertension  Assessment/Plan:      The patient's hypertensive risk group is category B: At least one risk factor (excluding diabetes) with no target organ damage.  Today's blood pressure is 140/80.  Her blood pressure goal is < 140/90.  Patient Instructions: 1)  Please schedule a follow-up appointment in 1 month. Prescriptions: DEXILANT 60 MG CPDR (DEXLANSOPRAZOLE) one by mouth daily  #30 x 11   Entered and Authorized by:   Stacie Glaze MD   Signed by:   Stacie Glaze MD on 02/13/2010   Method used:    Electronically to        [facility]  [phone]* (retail)       [address]       [place], [place]  [address]       Ph: [phone] or [phone]       Fax: [phone]   RxID:   [id number]

## 2010-10-06 ENCOUNTER — Encounter: Payer: Self-pay | Admitting: Internal Medicine

## 2010-10-11 NOTE — Letter (Signed)
Summary: Lipid reminder  [facility], Main Office  [address]   [place], [place] [address]   Phone: [phone]  Fax: [phone]        October 06, 2010 MRN: [id number]    [facility] [place] [address] [place], [place]  [address]    Dear Ms. Varney,  Our records indicate it is time to check your cholesterol, due to your recent medication change.  Please call our office to schedule an appt for labwork.  Please remember it is a fasting lab.     Sincerely,  Arvilla Meres, MD Meredith Staggers, RN  This letter has been electronically signed by your physician.

## 2010-10-11 NOTE — Assessment & Plan Note (Signed)
Summary: follow up/cjr   Vital Signs:  Patient profile:   60 year old female Height:      62 inches Weight:      148 pounds BMI:     27.17 Temp:     98.2 degrees F oral Pulse rate:   68 / minute Resp:     14 per minute BP sitting:   136 / 80  (left arm)  Vitals Entered By: Willy Eddy, LPN (October 02, 2010 62:24 PM) CC: roa Is Patient Diabetic? No   Primary Care Provider:  Stacie Glaze MD  CC:  roa.  History of Present Illness:  this is a 60 year old white female who is followed for hypertension hyperlipidemia and a history of GERD. She presents today for monitoring of her medications and condition she had a recent increase in her Crestor from 20-40 mg. She has not had any new side effects from this medication and is tolerating it and a lipid panel has not been drawn since this was increased. Since the medication was increased in November we will check a liver function and an LDL C. 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 Take 1 Tablet By Mouth Two Times A  Day ( Increased 05/18/2010) 2)  Pristiq 100 Mg Xr24h-Tab (Desvenlafaxine Succinate) .... One By Mouth Daly 3)  Vivelle 0.05 Mg/24hr  Pttw (Estradiol) .... Change Twice A Week 4)  Dexilant 60 Mg Cpdr (Dexlansoprazole) .... One By Mouth Daily 5)  Crestor 40 Mg Tabs (Rosuvastatin Calcium) .... Take One Tablet By Mouth Daily. 6)  Spiriva Handihaler 18 Mcg Caps (Tiotropium Bromide Monohydrate) .... One Ppuff By Mouth Daily 7)  Acyclovir 400 Mg Tabs (Acyclovir) .... One Ppo Three Times A Day As Needed Cold Sore 8)  Aspirin 81 Mg Tabs (Aspirin) .Marland Kitchen.. 1 Once Daily  Allergies (verified): 1)  ! Codeine Sulfate (Codeine Sulfate) 2)  ! Hydrocodone-Acetaminophen (Hydrocodone-Acetaminophen) 3)  ! Augmentin (Amoxicillin-Pot Clavulanate)  Past History:  Family History: Last updated: 06/06/2007 Family History of CAD Female 1st degree relative <60 COPD  Social History: Last updated: 06/06/2007 Married Former Smoker Alcohol use-no Drug use-no  Risk Factors: Smoking Status: quit (10/02/2010)  Past medical, surgical, family and social histories (including risk factors) reviewed, and no changes noted (except as noted below).  Past Medical History: Reviewed history from 04/14/2007 and no changes required. GERD Hypertension Osteopenia menopause Barretts  Past Surgical History: Reviewed history from 04/14/2007 and no changes required. Hysterectomy--TAH Sinus surgery Tonsillectomy, adenoidectomy  Family History: Reviewed history from 06/06/2007 and no changes required. Family History of CAD  Female 1st degree relative <60 COPD  Social History: Reviewed history from 06/06/2007 and no changes required. Married Former Smoker Alcohol use-no Drug use-no  Review of Systems  The patient denies anorexia, fever, weight loss, weight gain, vision loss, decreased hearing, hoarseness, chest pain, syncope, dyspnea on exertion, peripheral edema, prolonged cough, headaches, hemoptysis, abdominal  pain, melena, hematochezia, severe indigestion/heartburn, hematuria, incontinence, genital sores, muscle weakness, suspicious skin lesions, transient blindness, difficulty walking, depression, unusual weight change, abnormal bleeding, enlarged lymph nodes, angioedema, and breast masses.    Physical Exam  General:  alert and well-nourished.   Head:  normocephalic and atraumatic.   Eyes:  pupils equal and pupils round.   no hemmorrhages Ears:  R ear normal and L ear normal.   Nose:  External nasal examination shows no deformity or inflammation. Nasal mucosa are pink and moist without lesions or exudates. Mouth:  pharynx pink and moist and no erythema.   Neck:  supple.   Lungs:  normal respiratory effort and R wheezes.   Heart:  normal rate and regular rhythm.  grade  1/6 systolic murmur.  mitral Abdomen:  Bowel sounds positive,abdomen soft and non-tender without masses, organomegaly or hernias noted. Msk:  No deformity or scoliosis noted of thoracic or lumbar spine.   Extremities:  No clubbing, cyanosis, edema, or deformity noted with normal full range of motion of all joints.   Neurologic:  alert & oriented X3 and finger-to-nose normal.     Impression & Recommendations:  Problem # 1:  HYPERLIPIDEMIA, MILD (ICD-272.4) Assessment Improved  we will monitor her liver and her lipid parameters today. Her updated medication list for this problem includes:    Crestor 40 Mg Tabs (Rosuvastatin calcium) .Marland Kitchen... Take one tablet by mouth daily.  Labs Reviewed: SGOT: 19 (07/07/2010)   SGPT: 18 (07/07/2010)  Lipid Goals: Chol Goal: 200 (11/28/2009)   HDL Goal: 40 (11/28/2009)   LDL Goal: 160 (11/28/2009)   TG Goal: 150 (11/28/2009)  Prior 10 Yr Risk Heart Disease: N/A (05/18/2010)   HDL:52.50 (07/07/2010), 71.40 (11/17/2009)  LDL:92 (07/07/2010)  Chol:165 (07/07/2010), 210 (11/17/2009)  Trig:102.0 (07/07/2010), 109.0 (11/17/2009)  Orders: TLB-Cholesterol, Direct LDL  (83721-DIRLDL) TLB-Cholesterol, HDL (83718-HDL) Venipuncture (16109)  Problem # 2:  CHRONIC OBSTRUCTIVE PULMONARY DISEASE, MILD (ICD-496) Assessment: Improved   patient has mild COPD currently  on spirva... The following medications were removed from the medication list:    Dulera 100-5 Mcg/act Aero (Mometasone furo-formoterol fum) ..... One puf two times a day Her updated medication list for this problem includes:    Spiriva Handihaler 18 Mcg Caps (Tiotropium bromide monohydrate) ..... One ppuff by mouth daily  Vaccines Reviewed: Flu Vax: Historical (08/03/2009)  Orders: Spirometry w/Graph (94010)  Problem # 3:  HYPERTENSION (ICD-401.9) Assessment: Unchanged  Her updated medication list for this problem includes:    Nadolol 20 Mg Tabs (Nadolol) .Marland Kitchen... Take 1 tablet by mouth two times a day ( increased 05/18/2010)  BP today: 136/80 Prior BP: 142/78 (07/06/2010)  Prior 10 Yr Risk Heart Disease: N/A (05/18/2010)  Labs Reviewed: K+: 4.6 (07/07/2010) Creat: : 0.6 (07/07/2010)   Chol: 165 (07/07/2010)   HDL: 52.50 (07/07/2010)   LDL: 92 (07/07/2010)   TG: 102.0 (07/07/2010)  Complete Medication List: 1)  Nadolol 20 Mg Tabs (Nadolol) .... Take 1 tablet by mouth two times a day ( increased 05/18/2010) 2)  Pristiq 100 Mg Xr24h-tab (Desvenlafaxine succinate) .... One by mouth daly 3)  Vivelle 0.05 Mg/24hr Pttw (Estradiol) .... Change twice a week 4)  Dexilant  60 Mg Cpdr (Dexlansoprazole) .... One by mouth daily 5)  Crestor 40 Mg Tabs (Rosuvastatin calcium) .... Take one tablet by mouth daily. 6)  Spiriva Handihaler 18 Mcg Caps (Tiotropium bromide monohydrate) .... One ppuff by mouth daily 7)  Acyclovir 400 Mg Tabs (Acyclovir) .... One ppo three times a day as needed cold sore 8)  Aspirin 81 Mg Tabs (Aspirin) .Marland Kitchen.. 1 once daily  Patient Instructions: 1)  Please schedule a follow-up appointment in 4 months. Prescriptions: VIVELLE 0.05 MG/24HR  PTTW (ESTRADIOL) change twice a week  #10  Each x 5   Entered and Authorized by:   Stacie Glaze MD   Signed by:   Stacie Glaze MD on 10/02/2010   Method used:   Electronically to        [facility]  [phone]* (retail)       [address]       [place], [place]  [address]       Ph: [phone] or [phone]       Fax: [phone]   RxID:   [id number]    Orders Added: 1)  TLB-Cholesterol, Direct LDL [83721-DIRLDL] 2)  TLB-Cholesterol, HDL [83718-HDL] 3)  Venipuncture [87564] 4)  Est. Patient Level IV [33295] 5)  Spirometry w/Graph [94010]  Appended Document: Orders Update    Clinical Lists Changes  Orders: Added new Service order of Specimen Handling (18841) - Signed

## 2010-10-23 ENCOUNTER — Telehealth: Payer: Self-pay | Admitting: Internal Medicine

## 2010-10-23 DIAGNOSIS — J069 Acute upper respiratory infection, unspecified: Secondary | ICD-10-CM

## 2010-10-23 MED ORDER — AZITHROMYCIN 250 MG PO TABS: ORAL_TABLET | ORAL | Status: DC

## 2010-10-23 NOTE — Telephone Encounter (Signed)
Pt called and said she has sinus inf. Pt is req that Dr Lovell Sheehan call in an antibiotic to [facility] on [address].

## 2010-10-25 ENCOUNTER — Other Ambulatory Visit: Payer: Self-pay | Admitting: Internal Medicine

## 2010-10-25 ENCOUNTER — Encounter: Payer: Self-pay | Admitting: Internal Medicine

## 2010-10-25 ENCOUNTER — Ambulatory Visit (INDEPENDENT_AMBULATORY_CARE_PROVIDER_SITE_OTHER): Payer: BC Managed Care – PPO | Admitting: Internal Medicine

## 2010-10-25 VITALS — BP 124/80 | HR 72 | Temp 99.0°F | Resp 16 | Ht 62.0 in | Wt 148.0 lb

## 2010-10-25 DIAGNOSIS — J441 Chronic obstructive pulmonary disease with (acute) exacerbation: Secondary | ICD-10-CM

## 2010-10-25 DIAGNOSIS — J45901 Unspecified asthma with (acute) exacerbation: Secondary | ICD-10-CM

## 2010-10-25 MED ORDER — METHYLPREDNISOLONE ACETATE 80 MG/ML IJ SUSP
120.0000 mg | Freq: Once | INTRAMUSCULAR | Status: AC
  Administered 2010-10-25: 120 mg via INTRAMUSCULAR

## 2010-10-25 MED ORDER — PHENYLEPHRINE-DM-GG 2.5-5-100 MG/5ML PO LIQD: 5.0000 mL | Freq: Four times a day (QID) | ORAL | Status: DC | PRN

## 2010-10-25 MED ORDER — PREDNISONE (PAK) 10 MG PO TABS: ORAL_TABLET | Freq: Every day | ORAL | Status: AC

## 2010-10-25 MED ORDER — MOXIFLOXACIN HCL 400 MG PO TABS: 400.0000 mg | ORAL_TABLET | Freq: Every day | ORAL | Status: AC

## 2010-10-25 NOTE — Progress Notes (Signed)
Subjective:    Patient ID: Katherine Foley, female    DOB: 1951/07/19, 60 y.o.   MRN: [id number]  HPI  the patient has had a upper respiratory tract infection for over a week she called in last week for symptoms of  Bronchitis and a Z-Pak was sent the symptoms have persisted since then she remains febrile with a temperature of 99 she's lost her voice she has a cough with thick sputum that seems to hang in her throat congestion no nausea or vomiting or diarrhea. She does feel short of breath but she is in no apparent distress at the time of examination   she has a history of mild COPD. History of Barrett's esophagus Review of Systems  Constitutional: Positive for fever, chills and fatigue. Negative for activity change and appetite change.  HENT: Positive for rhinorrhea, sneezing, postnasal drip and sinus pressure. Negative for ear pain, congestion and neck pain.   Eyes: Negative for redness and visual disturbance.  Respiratory: Positive for chest tightness and shortness of breath. Negative for cough and wheezing.   Cardiovascular: Negative.   Gastrointestinal: Negative.  Negative for abdominal pain and abdominal distention.  Genitourinary: Negative.  Negative for dysuria, frequency and menstrual problem.  Musculoskeletal: Negative.  Negative for myalgias, joint swelling and arthralgias.  Skin: Negative for rash and wound.  Neurological: Negative.  Negative for dizziness, weakness and headaches.  Hematological: Negative for adenopathy. Does not bruise/bleed easily.  Psychiatric/Behavioral: Negative for sleep disturbance and decreased concentration.   . Past Medical History  Diagnosis Date  . ANXIETY DEPRESSION 02/15/2009  . AORTIC STENOSIS 07/07/2010  . Barrett's esophagus 04/14/2007  . CAD, NATIVE VESSEL 05/17/2010  . CAROTID BRUITS, BILATERAL 05/17/2010  . 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 Herpes simplex without  mention of complication 01/16/2010  . HYPERLIPIDEMIA, MILD 08/29/2009  . HYPERTENSION 04/14/2007  . MIGRAINE HEADACHE 04/20/2008  . OSTEOPENIA 04/14/2007  . URINARY INCONTINENCE, STRESS, MILD 08/26/2007   No past surgical history on file.  reports that she has quit smoking. She does not have any smokeless tobacco history on file. She reports that she does not drink alcohol or use illicit drugs. family history is not on file.  I have reviewed this patient's past medical history surgical history social history current medication list and current allergy list and have documented appropriate changes as necessary         Objective:   Physical Exam  Constitutional: She is oriented to person, place, and time. She appears well-developed and well-nourished.        Mild respiratory  distresswith a respiratory rate a 22  HENT:  Head: Normocephalic and atraumatic.  Eyes: Conjunctivae are normal. Pupils are equal, round, and reactive to light.  Neck: Normal range of motion. Neck supple.  Cardiovascular: Normal heart sounds.   Pulmonary/Chest: She is in respiratory distress. She has wheezes.        End expiratory dry wheezing  Abdominal: Soft. Bowel sounds are normal.  Musculoskeletal: Normal range of motion.  Neurological: She is alert and oriented to person, place, and time.  Skin: Skin is warm and dry.          Assessment & Plan:   the patient has mild COPD with acute exacerbation from a bronchitis that has been resistant to azithromycin.   she will require a course of steroids a more broad-spectrum antibiotic and cough suppressant for full ten-day course should her symptoms worsen during  this 10 day course she's been instructed to call our office for a return visit or should they worsen over the weekend to consider the emergency room if she is severely short of breath.   She may use her rescue inhaler up to 3 times a day the but should she need one and that she should contact our  office.  Blood pressure appears to be stable she is compliant with all of the medications  prescriptions were called into her pharmacy of choice

## 2010-10-27 ENCOUNTER — Ambulatory Visit (INDEPENDENT_AMBULATORY_CARE_PROVIDER_SITE_OTHER)
Admission: RE | Admit: 2010-10-27 | Discharge: 2010-10-27 | Disposition: A | Discharge status: Home / Self Care | Payer: BC Managed Care – PPO | Source: Ambulatory Visit | Attending: Internal Medicine | Admitting: Internal Medicine

## 2010-10-27 ENCOUNTER — Ambulatory Visit (INDEPENDENT_AMBULATORY_CARE_PROVIDER_SITE_OTHER): Payer: BC Managed Care – PPO | Admitting: Internal Medicine

## 2010-10-27 ENCOUNTER — Encounter: Payer: Self-pay | Admitting: Internal Medicine

## 2010-10-27 VITALS — BP 130/75 | HR 90 | Temp 98.9°F | Resp 22 | Ht 62.0 in | Wt 148.0 lb

## 2010-10-27 DIAGNOSIS — R059 Cough, unspecified: Secondary | ICD-10-CM

## 2010-10-27 DIAGNOSIS — R058 Other specified cough: Secondary | ICD-10-CM

## 2010-10-27 DIAGNOSIS — R05 Cough: Secondary | ICD-10-CM

## 2010-10-27 DIAGNOSIS — J449 Chronic obstructive pulmonary disease, unspecified: Secondary | ICD-10-CM

## 2010-10-27 MED ORDER — IPRATROPIUM-ALBUTEROL 0.5-2.5 (3) MG/3ML IN SOLN: 3.0000 mL | Freq: Three times a day (TID) | RESPIRATORY_TRACT | Status: DC

## 2010-10-27 NOTE — Assessment & Plan Note (Signed)
Was seen within 48 hours with acute exacerbation of chronic COPD with wheezing she was treated with antibiotics oral steroids and was given a Depo-Medrol shot in her office and a rescue inhaler she presents on return with interval improvement then worsening after the Depo-Medrol shot wore off she states that she began having increased wheezing and tightness in the chest and increased use of the rescue inhaler.   we gave her a nebulizer treatment with point 5 cc of Atrovent and 46 cc of -year-old with good result in alleviating her symptoms she'll be given a prescription for these medications and a nebulizer to take home for one week 2 monitor her respiratory status she will call office on Monday with an update should she have increasing shortness of breath she will present to the emergency room she understood these instructions and demonstrated the ability to use the nebulizer in office

## 2010-10-27 NOTE — Progress Notes (Signed)
Addended by: Melchor Amour on: 10/27/2010 01:25 PM   Modules accepted: Orders

## 2010-10-27 NOTE — Progress Notes (Signed)
Subjective:    Patient ID: Katherine Foley, female    DOB: 1950-11-10, 60 y.o.   MRN: [id number]  HPI  Patient is a 60 year old white female who presents with acute respiratory distress having been seen in office within 48 hours for asthmatic bronchitis and acute exacerbation of her COPD.  She states that she initially improved with the steroids antibiotics that she was given but developed chest tightness and her worsening of her cough over the past morning and presents for further evaluation.   she was diagnosed with possible early community acquired pneumonia type of exacerbation of COPD she has been relatively stable with her COPD it is not required rescue inhaler use over the past year she has been on Spiriva as a maintenance drug.   today she presents and is in moderate respiratory distress is pale not diaphoretic good color to her extremities.     Review of Systems  Constitutional: Positive for fatigue. Negative for activity change and appetite change.  HENT: Negative for ear pain, congestion, neck pain, postnasal drip and sinus pressure.   Eyes: Negative for redness and visual disturbance.  Respiratory: Positive for cough, chest tightness, shortness of breath and wheezing.   Gastrointestinal: Negative for abdominal pain and abdominal distention.  Genitourinary: Negative for dysuria, frequency and menstrual problem.  Musculoskeletal: Negative for myalgias, joint swelling and arthralgias.  Skin: Negative for rash and wound.  Neurological: Negative for dizziness, weakness and headaches.  Hematological: Negative for adenopathy. Does not bruise/bleed easily.  Psychiatric/Behavioral: Negative for sleep disturbance and decreased concentration.   Past Medical History  Diagnosis Date  . ANXIETY DEPRESSION 02/15/2009  . AORTIC STENOSIS 07/07/2010  . Barrett's esophagus 04/14/2007  . CAD, NATIVE VESSEL 05/17/2010  . CAROTID BRUITS, BILATERAL 05/17/2010  . CHRONIC OBSTRUCTIVE PULMONARY  DISEASE, MILD 07/23/2007  . 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 Herpes simplex without mention of complication 01/16/2010  . HYPERLIPIDEMIA, MILD 08/29/2009  . HYPERTENSION 04/14/2007  . MIGRAINE HEADACHE 04/20/2008  . OSTEOPENIA 04/14/2007  . URINARY INCONTINENCE, STRESS, MILD 08/26/2007   History reviewed. No pertinent past surgical history.  reports that she has quit smoking. She does not have any smokeless tobacco history on file. She reports that she does not drink alcohol or use illicit drugs. family history is not on file.        Objective:   Physical Exam  Constitutional: She is oriented to person, place, and time. She appears well-developed and well-nourished. No distress.  HENT:  Head: Normocephalic and atraumatic.  Right Ear: External ear normal.  Left Ear: External ear normal.  Nose: Nose normal.  Mouth/Throat: Oropharynx is clear and moist.  Eyes: Conjunctivae and EOM are normal. Pupils are equal, round, and reactive to light.  Neck: Normal range of motion. Neck supple. No JVD present. No tracheal deviation present. No thyromegaly present.  Cardiovascular: Normal rate, regular rhythm, normal heart sounds and intact distal pulses.   No murmur heard. Pulmonary/Chest: Effort normal. She has wheezes. She exhibits tenderness.  Abdominal: Soft. Bowel sounds are normal.  Musculoskeletal: Normal range of motion. She exhibits no edema and no tenderness.  Lymphadenopathy:    She has no cervical adenopathy.  Neurological: She is alert and oriented to person, place, and time. She has normal reflexes. No cranial nerve deficit.  Skin: Skin is warm and dry. She is not diaphoretic.  Psychiatric: She has a normal mood and affect. Her behavior is normal.          Assessment &  Plan:   patient was given a nebulizer treatment in the office with a demonstrated improvement she was given a nebulizer device to take him to utilize with doing  she was instructed to use  it 3 times a day continue her antibiotics or steroid burst and taper monitor her respiratory status if she should develop acute shortness of breath she was instructed to go to the emergency room she will call us on Monday with status up-to-date

## 2010-11-14 LAB — COMPREHENSIVE METABOLIC PANEL
ALT: 19 U/L (ref 0–35)
AST: 19 U/L (ref 0–37)
Alkaline Phosphatase: 33 U/L — ABNORMAL LOW (ref 39–117)
CO2: 29 mEq/L (ref 19–32)
Chloride: 99 mEq/L (ref 96–112)
GFR calc Af Amer: >60 mL/min (ref >60)
GFR calc non Af Amer: >60 mL/min (ref >60)
Glucose, Bld: 84 mg/dL (ref 70–99)
Sodium: 134 mEq/L — ABNORMAL LOW (ref 135–145)
Total Bilirubin: 0.5 mg/dL (ref 0.3–1.2)

## 2010-11-14 LAB — CBC
HCT: 27.5 % — ABNORMAL LOW (ref 36.0–46.0)
HCT: 30.2 % — ABNORMAL LOW (ref 36.0–46.0)
Hemoglobin: 13.4 g/dL (ref 12.0–15.0)
Hemoglobin: 9.1 g/dL — ABNORMAL LOW (ref 12.0–15.0)
MCH: 31 pg (ref 26.0–34.0)
MCHC: 33.3 g/dL (ref 30.0–36.0)
MCV: 93.7 fL (ref 78.0–100.0)
Platelets: 169 10*3/uL (ref 150–400)
RBC: 2.93 MIL/uL — ABNORMAL LOW (ref 3.87–5.11)
RBC: 3.23 MIL/uL — ABNORMAL LOW (ref 3.87–5.11)
RDW: 14.5 % (ref 11.5–15.5)
RDW: 14.9 % (ref 11.5–15.5)
WBC: 7.7 10*3/uL (ref 4.0–10.5)

## 2010-11-14 LAB — MRSA CULTURE

## 2010-11-14 LAB — BASIC METABOLIC PANEL
BUN: 9 mg/dL (ref 6–23)
CO2: 25 mEq/L (ref 19–32)
Calcium: 8.9 mg/dL (ref 8.4–10.5)
Creatinine, Ser: 0.61 mg/dL (ref 0.4–1.2)
GFR calc non Af Amer: >60 mL/min (ref >60)
Glucose, Bld: 113 mg/dL — ABNORMAL HIGH (ref 70–99)

## 2010-11-14 LAB — PROTIME-INR
INR: 0.9 (ref 0.00–1.49)
Prothrombin Time: 12.4 seconds (ref 11.6–15.2)

## 2010-11-17 LAB — CARDIAC PANEL(CRET KIN+CKTOT+MB+TROPI)
CK, MB: 1.5 ng/mL (ref 0.3–4.0)
CK, MB: 1.6 ng/mL (ref 0.3–4.0)
CK, MB: 2.9 ng/mL (ref 0.3–4.0)
Relative Index: INVALID (ref 0.0–2.5)
Relative Index: INVALID (ref 0.0–2.5)
Relative Index: INVALID (ref 0.0–2.5)
Relative Index: INVALID (ref 0.0–2.5)
Total CK: 58 U/L (ref 7–177)
Total CK: 58 U/L (ref 7–177)
Total CK: 67 U/L (ref 7–177)
Total CK: 70 U/L (ref 7–177)
Troponin I: 0.02 ng/mL (ref 0.00–0.06)
Troponin I: 0.06 ng/mL (ref 0.00–0.06)
Troponin I: 0.08 ng/mL — ABNORMAL HIGH (ref 0.00–0.06)
Troponin I: 0.11 ng/mL — ABNORMAL HIGH (ref 0.00–0.06)
Troponin I: 0.31 ng/mL — ABNORMAL HIGH (ref 0.00–0.06)
Troponin I: 0.39 ng/mL — ABNORMAL HIGH (ref 0.00–0.06)

## 2010-11-17 LAB — LIPID PANEL
LDL Cholesterol: 113 mg/dL — ABNORMAL HIGH (ref 0–99)
Total CHOL/HDL Ratio: 3.5 RATIO
Triglycerides: 123 mg/dL (ref <150)
VLDL: 25 mg/dL (ref 0–40)

## 2010-11-17 LAB — COMPREHENSIVE METABOLIC PANEL
ALT: 20 U/L (ref 0–35)
AST: 24 U/L (ref 0–37)
Albumin: 3.9 g/dL (ref 3.5–5.2)
Calcium: 9.4 mg/dL (ref 8.4–10.5)
GFR calc Af Amer: >60 mL/min (ref >60)
Glucose, Bld: 111 mg/dL — ABNORMAL HIGH (ref 70–99)
Sodium: 134 mEq/L — ABNORMAL LOW (ref 135–145)
Total Protein: 6.7 g/dL (ref 6.0–8.3)

## 2010-11-17 LAB — CBC
HCT: 33 % — ABNORMAL LOW (ref 36.0–46.0)
MCH: 29.6 pg (ref 26.0–34.0)
MCHC: 32.1 g/dL (ref 30.0–36.0)
MCHC: 32.5 g/dL (ref 30.0–36.0)
MCHC: 32.7 g/dL (ref 30.0–36.0)
MCV: 91.2 fL (ref 78.0–100.0)
MCV: 92.7 fL (ref 78.0–100.0)
Platelets: 157 10*3/uL (ref 150–400)
Platelets: 163 10*3/uL (ref 150–400)
Platelets: 188 10*3/uL (ref 150–400)
RBC: 3.85 MIL/uL — ABNORMAL LOW (ref 3.87–5.11)
RDW: 13.3 % (ref 11.5–15.5)
RDW: 13.6 % (ref 11.5–15.5)
WBC: 5 10*3/uL (ref 4.0–10.5)

## 2010-11-17 LAB — POCT I-STAT, CHEM 8
Creatinine, Ser: 0.6 mg/dL (ref 0.4–1.2)
Glucose, Bld: 105 mg/dL — ABNORMAL HIGH (ref 70–99)
Hemoglobin: 13.3 g/dL (ref 12.0–15.0)
TCO2: 25 mmol/L (ref 0–100)

## 2010-11-17 LAB — BASIC METABOLIC PANEL
BUN: 6 mg/dL (ref 6–23)
CO2: 26 mEq/L (ref 19–32)
CO2: 26 mEq/L (ref 19–32)
Calcium: 8.1 mg/dL — ABNORMAL LOW (ref 8.4–10.5)
Calcium: 8.3 mg/dL — ABNORMAL LOW (ref 8.4–10.5)
Chloride: 107 mEq/L (ref 96–112)
Creatinine, Ser: 0.48 mg/dL (ref 0.4–1.2)
Creatinine, Ser: 0.52 mg/dL (ref 0.4–1.2)
Creatinine, Ser: 0.55 mg/dL (ref 0.4–1.2)
GFR calc Af Amer: >60 mL/min (ref >60)
GFR calc non Af Amer: >60 mL/min (ref >60)
Glucose, Bld: 106 mg/dL — ABNORMAL HIGH (ref 70–99)
Glucose, Bld: 95 mg/dL (ref 70–99)
Potassium: 3.8 mEq/L (ref 3.5–5.1)
Sodium: 141 mEq/L (ref 135–145)

## 2010-11-17 LAB — PLATELET INHIBITION P2Y12
P2Y12 % Inhibition: 43 %
Platelet Function  P2Y12: 209 [PRU] (ref 194–418)
Platelet Function Baseline: 370 [PRU] (ref 194–418)

## 2010-11-17 LAB — PLATELET FUNCTION ASSAY: Collagen / ADP: 126 seconds (ref 0–114)

## 2010-11-17 LAB — DIFFERENTIAL
Eosinophils Absolute: 0 10*3/uL (ref 0.0–0.7)
Lymphs Abs: 1 10*3/uL (ref 0.7–4.0)
Monocytes Absolute: 0.5 10*3/uL (ref 0.1–1.0)
Monocytes Relative: 10 % (ref 3–12)
Neutrophils Relative %: 71 % (ref 43–77)

## 2010-11-17 LAB — POCT CARDIAC MARKERS
CKMB, poc: <1 ng/mL — ABNORMAL LOW (ref 1.0–8.0)
Myoglobin, poc: 66.6 ng/mL (ref 12–200)
Myoglobin, poc: 69.5 ng/mL (ref 12–200)

## 2010-11-17 LAB — PROTIME-INR: INR: 0.88 (ref 0.00–1.49)

## 2010-11-17 LAB — CK TOTAL AND CKMB (NOT AT ARMC)
CK, MB: 1 ng/mL (ref 0.3–4.0)
Total CK: 56 U/L (ref 7–177)

## 2010-12-21 ENCOUNTER — Inpatient Hospital Stay (HOSPITAL_COMMUNITY)
Admission: EM | Admit: 2010-12-21 | Discharge: 2010-12-26 | DRG: 854 | Disposition: A | Discharge status: Home / Self Care | Payer: BC Managed Care – PPO | Attending: Internal Medicine | Admitting: Internal Medicine

## 2010-12-21 ENCOUNTER — Telehealth: Payer: Self-pay | Admitting: Internal Medicine

## 2010-12-21 ENCOUNTER — Emergency Department (HOSPITAL_COMMUNITY): Payer: BC Managed Care – PPO

## 2010-12-21 DIAGNOSIS — Z9861 Coronary angioplasty status: Secondary | ICD-10-CM

## 2010-12-21 DIAGNOSIS — I252 Old myocardial infarction: Secondary | ICD-10-CM

## 2010-12-21 DIAGNOSIS — E785 Hyperlipidemia, unspecified: Secondary | ICD-10-CM | POA: Diagnosis present

## 2010-12-21 DIAGNOSIS — Z7982 Long term (current) use of aspirin: Secondary | ICD-10-CM

## 2010-12-21 DIAGNOSIS — J449 Chronic obstructive pulmonary disease, unspecified: Secondary | ICD-10-CM | POA: Diagnosis present

## 2010-12-21 DIAGNOSIS — I200 Unstable angina: Secondary | ICD-10-CM | POA: Diagnosis present

## 2010-12-21 DIAGNOSIS — I359 Nonrheumatic aortic valve disorder, unspecified: Secondary | ICD-10-CM | POA: Diagnosis present

## 2010-12-21 DIAGNOSIS — J4489 Other specified chronic obstructive pulmonary disease: Secondary | ICD-10-CM | POA: Diagnosis present

## 2010-12-21 DIAGNOSIS — I2510 Atherosclerotic heart disease of native coronary artery without angina pectoris: Principal | ICD-10-CM | POA: Diagnosis present

## 2010-12-21 DIAGNOSIS — K2270 Barrett's esophagus without dysplasia: Secondary | ICD-10-CM | POA: Diagnosis present

## 2010-12-21 DIAGNOSIS — G43909 Migraine, unspecified, not intractable, without status migrainosus: Secondary | ICD-10-CM | POA: Diagnosis present

## 2010-12-21 DIAGNOSIS — K219 Gastro-esophageal reflux disease without esophagitis: Secondary | ICD-10-CM | POA: Diagnosis present

## 2010-12-21 DIAGNOSIS — Z87891 Personal history of nicotine dependence: Secondary | ICD-10-CM

## 2010-12-21 DIAGNOSIS — I10 Essential (primary) hypertension: Secondary | ICD-10-CM | POA: Diagnosis present

## 2010-12-21 DIAGNOSIS — F341 Dysthymic disorder: Secondary | ICD-10-CM | POA: Diagnosis present

## 2010-12-21 DIAGNOSIS — R079 Chest pain, unspecified: Secondary | ICD-10-CM

## 2010-12-21 LAB — TROPONIN I: Troponin I: 0.01 ng/mL (ref 0.00–0.06)

## 2010-12-21 LAB — BASIC METABOLIC PANEL
BUN: 11 mg/dL (ref 6–23)
GFR calc non Af Amer: >60 mL/min (ref >60)
Glucose, Bld: 102 mg/dL — ABNORMAL HIGH (ref 70–99)
Potassium: 4.7 mEq/L (ref 3.5–5.1)

## 2010-12-21 LAB — DIFFERENTIAL
Basophils Absolute: 0 10*3/uL (ref 0.0–0.1)
Basophils Relative: 0 % (ref 0–1)
Eosinophils Absolute: 0 10*3/uL (ref 0.0–0.7)
Eosinophils Relative: 0 % (ref 0–5)

## 2010-12-21 LAB — POCT CARDIAC MARKERS
CKMB, poc: <1 ng/mL — ABNORMAL LOW (ref 1.0–8.0)
Myoglobin, poc: 46.4 ng/mL (ref 12–200)

## 2010-12-21 LAB — CBC
MCV: 89.7 fL (ref 78.0–100.0)
Platelets: 188 10*3/uL (ref 150–400)
RDW: 14.1 % (ref 11.5–15.5)
WBC: 7.9 10*3/uL (ref 4.0–10.5)

## 2010-12-21 NOTE — Telephone Encounter (Signed)
Pt having sob and pain in back on and off for about 2 weeks, worse last couple days

## 2010-12-21 NOTE — Telephone Encounter (Signed)
Pt reports back pain similar to pain she had prior to stent being placed. Also, complaining of chest pain off and on for last couple of weeks.  No chest pain at present time.  Pt states she has had increasing shortness of breath for last few days.  Pt instructed she needs to go to ED at [facility] to be evaluated. Pt agreeable with this plan.

## 2010-12-22 DIAGNOSIS — I2510 Atherosclerotic heart disease of native coronary artery without angina pectoris: Secondary | ICD-10-CM

## 2010-12-22 DIAGNOSIS — I359 Nonrheumatic aortic valve disorder, unspecified: Secondary | ICD-10-CM

## 2010-12-22 LAB — HEPARIN LEVEL (UNFRACTIONATED)
Heparin Unfractionated: 0.74 IU/mL — ABNORMAL HIGH (ref 0.30–0.70)
Heparin Unfractionated: 0.54 IU/mL (ref 0.30–0.70)

## 2010-12-22 LAB — CARDIAC PANEL(CRET KIN+CKTOT+MB+TROPI)
CK, MB: 1.3 ng/mL (ref 0.3–4.0)
Troponin I: 0.01 ng/mL (ref 0.00–0.06)

## 2010-12-22 LAB — POCT ACTIVATED CLOTTING TIME: Activated Clotting Time: 123 seconds

## 2010-12-22 LAB — TSH: TSH: 1.231 u[IU]/mL (ref 0.350–4.500)

## 2010-12-22 LAB — CBC
HCT: 36.4 % (ref 36.0–46.0)
Hemoglobin: 11.7 g/dL — ABNORMAL LOW (ref 12.0–15.0)
MCH: 28.9 pg (ref 26.0–34.0)
MCHC: 32.1 g/dL (ref 30.0–36.0)

## 2010-12-22 LAB — APTT: aPTT: 115 seconds — ABNORMAL HIGH (ref 24–37)

## 2010-12-22 LAB — LIPID PANEL
Cholesterol: 136 mg/dL (ref 0–200)
Total CHOL/HDL Ratio: 2.3 RATIO

## 2010-12-23 DIAGNOSIS — I359 Nonrheumatic aortic valve disorder, unspecified: Secondary | ICD-10-CM

## 2010-12-23 LAB — BASIC METABOLIC PANEL
CO2: 27 mEq/L (ref 19–32)
Calcium: 8.5 mg/dL (ref 8.4–10.5)
Creatinine, Ser: 0.52 mg/dL (ref 0.4–1.2)
GFR calc Af Amer: >60 mL/min (ref >60)
GFR calc non Af Amer: >60 mL/min (ref >60)
Sodium: 137 mEq/L (ref 135–145)

## 2010-12-23 LAB — CBC
Hemoglobin: 11.7 g/dL — ABNORMAL LOW (ref 12.0–15.0)
MCH: 29.8 pg (ref 26.0–34.0)
MCHC: 32.8 g/dL (ref 30.0–36.0)
Platelets: 174 10*3/uL (ref 150–400)

## 2010-12-25 LAB — POCT ACTIVATED CLOTTING TIME: Activated Clotting Time: 364 seconds

## 2010-12-26 DIAGNOSIS — I200 Unstable angina: Secondary | ICD-10-CM

## 2010-12-26 LAB — BASIC METABOLIC PANEL
BUN: 7 mg/dL (ref 6–23)
Chloride: 103 mEq/L (ref 96–112)
GFR calc Af Amer: >60 mL/min (ref >60)
GFR calc non Af Amer: >60 mL/min (ref >60)
Potassium: 4.2 mEq/L (ref 3.5–5.1)
Sodium: 135 mEq/L (ref 135–145)

## 2010-12-26 LAB — CBC
MCV: 90.2 fL (ref 78.0–100.0)
Platelets: 162 10*3/uL (ref 150–400)
RBC: 3.86 MIL/uL — ABNORMAL LOW (ref 3.87–5.11)
RDW: 14.3 % (ref 11.5–15.5)
WBC: 6.5 10*3/uL (ref 4.0–10.5)

## 2011-01-01 ENCOUNTER — Encounter: Payer: Self-pay | Admitting: Internal Medicine

## 2011-01-01 ENCOUNTER — Telehealth: Payer: Self-pay | Admitting: *Deleted

## 2011-01-01 ENCOUNTER — Telehealth: Payer: Self-pay | Admitting: Internal Medicine

## 2011-01-01 ENCOUNTER — Ambulatory Visit (INDEPENDENT_AMBULATORY_CARE_PROVIDER_SITE_OTHER): Payer: BC Managed Care – PPO | Admitting: Internal Medicine

## 2011-01-01 VITALS — BP 130/80 | HR 72 | Temp 98.2°F | Resp 16 | Ht 62.0 in | Wt 148.0 lb

## 2011-01-01 DIAGNOSIS — S5010XA Contusion of unspecified forearm, initial encounter: Secondary | ICD-10-CM

## 2011-01-01 DIAGNOSIS — T887XXA Unspecified adverse effect of drug or medicament, initial encounter: Secondary | ICD-10-CM

## 2011-01-01 LAB — PROTIME-INR: Prothrombin Time: 10.7 s (ref 10.2–12.4)

## 2011-01-01 LAB — CBC WITH DIFFERENTIAL/PLATELET
Basophils Relative: 0.5 % (ref 0.0–3.0)
Eosinophils Relative: 0 % (ref 0.0–5.0)
HCT: 35.3 % — ABNORMAL LOW (ref 36.0–46.0)
Hemoglobin: 11.8 g/dL — ABNORMAL LOW (ref 12.0–15.0)
Lymphs Abs: 1.1 10*3/uL (ref 0.7–4.0)
MCV: 92.3 fl (ref 78.0–100.0)
Monocytes Absolute: 0.6 10*3/uL (ref 0.1–1.0)
Monocytes Relative: 10.3 % (ref 3.0–12.0)
Platelets: 198 10*3/uL (ref 150.0–400.0)
RBC: 3.83 Mil/uL — ABNORMAL LOW (ref 3.87–5.11)
WBC: 5.6 10*3/uL (ref 4.5–10.5)

## 2011-01-01 NOTE — Telephone Encounter (Signed)
Ov now 

## 2011-01-01 NOTE — Telephone Encounter (Signed)
Pt went back to work today after stent placement, she hit her arm and has now swollen to the size of a baseball, has ice on it, what should she do?

## 2011-01-01 NOTE — Progress Notes (Signed)
Subjective:    Patient ID: Katherine Foley, female    DOB: [birth year], 60 y.o.   MRN: [id number]  HPI patient is a 60 year old white female recently had a stent placed for coronary artery disease and was placed on aspirin and Plavix.  At work today she had her left forearm and immediately noticed swelling.  She presented to the office today with significant hematoma on the left forearm approximately 4 x 6 cm.  She noticed spontaneous bruising in other areas she does not recall any trauma to her right forearm he had there are 2 smaller hematomas present there. She has been on Plavix and aspirin for approximately 2 weeks. She has no other history of bleeding disorder or easy bruising    Review of Systems  Constitutional: Negative for activity change, appetite change and fatigue.  HENT: Negative for ear pain, congestion, neck pain, postnasal drip and sinus pressure.   Eyes: Negative for redness and visual disturbance.  Respiratory: Negative for cough, shortness of breath and wheezing.   Gastrointestinal: Negative for abdominal pain and abdominal distention.  Genitourinary: Negative for dysuria, frequency and menstrual problem.  Musculoskeletal: Negative for myalgias, joint swelling and arthralgias.  Skin: Positive for color change. Negative for rash and wound.  Neurological: Negative for dizziness, weakness and headaches.  Hematological: Negative for adenopathy. Bruises/bleeds easily.  Psychiatric/Behavioral: Negative for sleep disturbance and decreased concentration.       Objective:   Physical Exam  Constitutional: She is oriented to person, place, and time. She appears well-developed and well-nourished. No distress.  HENT:  Head: Normocephalic and atraumatic.  Right Ear: External ear normal.  Left Ear: External ear normal.  Nose: Nose normal.  Mouth/Throat: Oropharynx is clear and moist.  Eyes: Conjunctivae and EOM are normal. Pupils are equal, round, and reactive to light.  Neck:  Normal range of motion. Neck supple. No JVD present. No tracheal deviation present. No thyromegaly present.  Cardiovascular: Normal rate, regular rhythm, normal heart sounds and intact distal pulses.   No murmur heard. Pulmonary/Chest: Effort normal and breath sounds normal. She has no wheezes. She exhibits no tenderness.  Abdominal: Soft. Bowel sounds are normal.  Musculoskeletal: Normal range of motion. She exhibits no edema and no tenderness.  Lymphadenopathy:    She has no cervical adenopathy.  Neurological: She is alert and oriented to person, place, and time. She has normal reflexes. No cranial nerve deficit.  Skin: Skin is warm and dry. She is not diaphoretic.       Significant bruising present on the left forearm proximally 4-6 cm the sub-dermal hematoma is soft and there is no skin breakdown  Psychiatric: She has a normal mood and affect. Her behavior is normal.          Assessment & Plan:  A phone call was made to cardiology to discuss her case with her primary cardiologist we will decrease her aspirin to 81 mg and continue her Plavix at this time I will take her out of work for one week to allow the bruising to heal and to allow the antiplatelet effect of the full dose aspirin to resolve. She has  risk for reocclusion of her stent if she stops her Plavix before we will not adjust her Plavix. I explained the risks and benefits of the medications to the patient instructed patient that she will need T. be out of work for an additional week. We will treat the site locally with compression bandage and ice and she said the followup  nursing visit in one week for wound check

## 2011-01-01 NOTE — Telephone Encounter (Signed)
No answer at pts work number, no Recruitment consultant

## 2011-01-01 NOTE — Telephone Encounter (Signed)
Called patient x 2. No answer or VM set up.

## 2011-01-01 NOTE — Telephone Encounter (Signed)
Pt states she hit her arm at work on a door.  (between elbow and wrist) A lump has appeared.   She is on the way to the office.  Per Dr. Lovell Sheehan, we can see her and put ice and ace on her arm.

## 2011-01-01 NOTE — Patient Instructions (Addendum)
Hold the aspirin for one day then begin 81 mg aspirin every other day  and continue the Plavix 75 mg daily We will draw appropriate blood work today including a CBC  And a measure of your clotting proteins You should refrain from heavy exertion at work for another week. See note

## 2011-01-02 NOTE — Discharge Summary (Addendum)
NAME:  Katherine Foley, Katherine Foley                 ACCOUNT NO.:  [id number]  MEDICAL RECORD NO.:  [id number]           PATIENT TYPE:  I  LOCATION:  6525                         FACILITY:  [facility]  PHYSICIAN:  Veverly Fells. Excell Seltzer, MD  DATE OF BIRTH:  [birth year]  DATE OF ADMISSION:  12/21/2010 DATE OF DISCHARGE:  12/26/2010                              DISCHARGE SUMMARY   DISCHARGE DIAGNOSES: 1. Unstable angina, status post percutaneous coronary intervention     plus drug-eluting stent placed to the ramus on December 25, 2010. 2. Prior history of coronary artery disease, status post non-ST     segment elevation myocardial infarction in August 2011 with bare-     metal stent to the mid right coronary artery, patent by     catheterization on December 22, 2010. 3. Moderate aortic stenosis by echocardiogram on December 23, 2010,     suspected bicuspid valve. 4. Hypertension. 5. Chronic obstructive pulmonary disease. 6. Hyperlipidemia. 7. Anxiety/depression. 8. Migraine headaches. 9. Gastroesophageal reflux disease/Barrett esophagus. 10.History of ovarian cyst.  HOSPITAL COURSE:  Katherine Foley is a pleasant 60 year old female with history of CAD, status post NSTEMI with the RCA stenting in August 2011, hypertension, hyperlipidemia who presented to [facility] with complaints of chest discomfort.  It has been occurring at rest and lasting for a few seconds, associated with some shortness of breath, not relieved with her usual COPD medications.  On day of admission, she had more severe episode of pain associated with prior MI, which was relieved by nitroglycerin at home.  Because of these symptoms, she was admitted to the hospital.  Cardiac enzymes were cycled, which were negative x3. EKG showed no acute changes.  She underwent heart catheterization on December 22, 2010 by Dr. Riley Kill to define her coronary anatomy.  She was known to have continued patency of the previously placed stents in the RCA, bowed a  focal lesion in the mid circumflex coronary artery.  There was known aortic valve stenosis of moderately-to-severe degree.  Dr. Riley Kill felt that this should be evaluated first with echocardiogram in the event that she would need bypass/AVR.  Her aortic valve on echo showed moderate-to-severe stenosis, but was suspected of overestimating the degree of AS.  Mean gradient was 29 mmHg with a valve area noted at 0.62 cm.  This was determined for continued monitoring.  She was brought back to the Cath Lab on December 25, 2010 and ultimately had Promus drug- eluting stent placed to the ramus intermediate.  The patient did well postprocedurally.  She was observed overnight.  Dr. Excell Seltzer has seen and examined her today and feels she is stable for discharge.  She also ambulated well with cardiac rehab.  DISCHARGE LABS:  WBC 6.5, hemoglobin 11.4, hematocrit 34.8, platelet count 162.  Sodium 135, potassium 4.2, chloride 103, CO2 28, glucose 90, BUN 7, creatinine 0.62.  TSH normal at 1.231, total cholesterol 136, triglycerides 50, HDL 58, LDL 68.  STUDIES: 1. Chest x-ray on December 21, 2010, showed COPD, but no active disease.     Little change from prior. 2. Cardiac catheterization on December 22, 2010  and December 25, 2010,     please see full report for details. 3. A 2-D echocardiogram on December 23, 2010 showed EF of 60-65% with no     wall motion abnormalities.  Moderate-to-severe AS with mean     gradient 29 mmHg with a valve area of 0.62 cm.  No defect or PFO     identified in atrial septum.  DISCHARGE MEDICATIONS: 1. Plavix 75 mg daily. 2. Aspirin 81 mg daily. 3. Dexilant 60 mg daily. 4. Dulera 200 mcg/5 mcg one puff b.i.d. 5. Nadolol 20 mg b.i.d. 6. Nitroglycerin sublingual 0.4 mg every 5 minutes as needed up to     three doses. 7. Pristiq XR 100 mg daily. 8. Crestor 20 mg daily. 9. Spiriva 18 mcg one nebulization inhaled daily. 10.Vivelle-Dot 0.05 mg Monday and Thursday patch applied  topically.  DISPOSITION:  Katherine Foley will be discharged in stable condition to home. She is instructed not to lift anything over 5 pounds for 1 week, participate in sexual activity for 1 week or drive for 2 days.  She is to follow a low-sodium, heart-healthy diet and if she notices any pain, swelling, bleeding, or pus at the cath site, she is to call or return. She will follow up with Tereso Newcomer, PA-C and Dr. Prescott Gum on Jan 10, 2011 at 9:30 a.m.  DURATION OF DISCHARGE ENCOUNTER:  Greater than 30 minutes including physician and PA time.     Ronie Spies, P.A.C.   ______________________________ Veverly Fells. Excell Seltzer, MD    DD/MEDQ  D:  12/26/2010  T:  12/26/2010  Job:  [id number]  cc:   Bevelyn Buckles. Bensimhon, MD Rosalyn Gess. Norins, MD  Electronically Signed by Ronie Spies  on 01/02/2011 04:35:51 PM Electronically Signed by Tonny Bollman MD on 01/04/2011 10:37:12 AM

## 2011-01-03 NOTE — Telephone Encounter (Signed)
Work # listed is incorrect, called pt's home # and Left message to call back

## 2011-01-04 NOTE — Cardiovascular Report (Signed)
  NAME:  Katherine Foley, Katherine Foley                 ACCOUNT NO.:  [id number]  MEDICAL RECORD NO.:  [id number]           PATIENT TYPE:  I  LOCATION:  6525                         FACILITY:  [facility]  PHYSICIAN:  Veverly Fells. Excell Seltzer, MD  DATE OF BIRTH:  1951-04-09  DATE OF PROCEDURE:  12/22/2010 DATE OF DISCHARGE:                           CARDIAC CATHETERIZATION   PROCEDURE:  PTCA and stenting of the ramus intermedius branch.  INDICATIONS:  Ms. Gierke is a 60 year old woman with known CAD.  She has undergone previous RCA stenting.  She presented with symptoms typical of unstable angina.  She underwent diagnostic catheterization demonstrating severe stenosis of the ramus intermedius branch.  Her RCA stents were patent.  The intermediate stenosis was estimated at 80% and there was hypodensity.  The lesion was concerning for a plaque rupture and suspected to be the source of the patient's unstable angina.  She was also noted to have moderately severe aortic stenosis.  A 2-D echo was consistent with moderate aortic stenosis, so we decided to proceed with PCI rather than a combination AVR/bypass.  All this was discussed thoroughly with the patient prior the procedure. Informed consent was obtained.  The right groin was prepped, draped and anesthetized with 1% lidocaine.  Using modified Seldinger technique, a 5- French sheath was placed in the right femoral artery.  A 6-French XB 3.5 cm guide catheter was inserted.  The patient had been adequately preloaded with 600 mg of clopidogrel.  Bivalve ring was started for anticoagulation.  Once a therapeutic ACT was achieved, a Cougar guidewire was advanced beyond the lesion.  There is significant tortuosity of the intermediate branch just beyond the lesion and it was difficult to advance the wire into the appropriate branch vessel.  Once the wire position was finally obtained, the lesion was dilated with a 2.5 x 8-mm apex balloon to 6 atmospheres.  I attempted to  stent the vessel with a 2.75 x 12-mm promise drug-eluting stent, but the stent would not pass.  The lesion had to be re-dilated with a 2.5 balloon at this time to 8 atmospheres.  I was unable to advance the stent and 2.75 x 12-mm promise drug-eluting stent was deployed at 11 atmospheres.  The stent was postdilated with a 2.75 mm noncompliant balloon, which was taken to 16 atmospheres.  The wire was pulled back and there was an excellent angiographic result with TIMI 3 flow.  There is 0% residual stenosis.  The patient tolerated the procedure well.  Perclose device was used at the completion of the case for femoral hemostasis.  FINAL ASSESSMENT:  Successful single-vessel PCI using a drug-eluting stent platform.  RECOMMENDATIONS:  The patient should remain on dual antiplatelet therapy with aspirin and Plavix for minimum of 12 months.     Veverly Fells. Excell Seltzer, MD     MDC/MEDQ  D:  12/25/2010  T:  12/26/2010  Job:  [id number]  cc:   Bevelyn Buckles. Bensimhon, MD Rosalyn Gess. Norins, MD  Electronically Signed by Tonny Bollman MD on 01/04/2011 10:37:17 AM

## 2011-01-05 NOTE — Telephone Encounter (Signed)
Spoke w/pt she states she saw her pcp, arm is getting better her pcp called and spoke w/Dr Bensimhon and they decided to change ASA to every other day for now

## 2011-01-06 ENCOUNTER — Encounter: Payer: Self-pay | Admitting: Physician Assistant

## 2011-01-10 ENCOUNTER — Ambulatory Visit (INDEPENDENT_AMBULATORY_CARE_PROVIDER_SITE_OTHER): Payer: BC Managed Care – PPO | Admitting: Physician Assistant

## 2011-01-10 ENCOUNTER — Encounter: Payer: Self-pay | Admitting: Physician Assistant

## 2011-01-10 VITALS — BP 138/89 | HR 64 | Ht 62.0 in | Wt 146.0 lb

## 2011-01-10 DIAGNOSIS — E785 Hyperlipidemia, unspecified: Secondary | ICD-10-CM

## 2011-01-10 DIAGNOSIS — I6529 Occlusion and stenosis of unspecified carotid artery: Secondary | ICD-10-CM

## 2011-01-10 DIAGNOSIS — I2510 Atherosclerotic heart disease of native coronary artery without angina pectoris: Secondary | ICD-10-CM

## 2011-01-10 DIAGNOSIS — I359 Nonrheumatic aortic valve disorder, unspecified: Secondary | ICD-10-CM

## 2011-01-10 NOTE — Patient Instructions (Signed)
Your physician recommends that you schedule a follow-up appointment in: 3 MONTHS WITH DR. Gala Romney AS PER SCOTT WEAVER, PA-C

## 2011-01-10 NOTE — Progress Notes (Signed)
History of Present Illness: Primary Cardiologist:  Dr. Arvilla Meres  Katherine Foley is a 60 y.o. female with CAD, s/p NSTEMI 8/11 treated with a bare-metal stent in to the RCA and aortic stenosis.  She was admitted 4/19-4/24 with unstable angina.  Myocardial infarction was ruled out.  Cardiac catheterization demonstrated patent stent in the RCA.  She was noted to have a high-grade lesion in the ramus intermediate at 80%.  Echocardiogram was performed prior to proceeding with PCI to ensure that her aortic stenosis had not significantly progressed.  The echocardiogram demonstrated an EF of 60-65%, moderate to severe aortic stenosis with a mean gradient of 29 mm of mercury.  It was thought that this was possibly overestimated.  PCI was performed on 4/23 with placement of a Promus drug-eluting stent to the ramus intermediate.  She had no immediate complications and her post PCI course was uneventful.  She returns for follow up.     She is doing well.  She denies any further anginal symptoms.  She denies shortness of breath.  She denies orthopnea, PND or edema.  She denies syncope.  She did bump her arm at work recently and developed a significant hematoma in her left arm.  She saw her PCP.  Her aspirin was changed to every other day.  It is slowly improving.    Past Medical History  Diagnosis Date  . ANXIETY DEPRESSION 02/15/2009  . AORTIC STENOSIS 07/07/2010    a. echo 12/23/10: EF 60-65%, mod to severe AS with mean gradient 29 mmHg  . Barrett's esophagus 04/14/2007  . CAD, NATIVE VESSEL 05/17/2010    a. NSTEMI 8/11 - BMS to RCA;  b. cath 4/12: dLAD 50%, RI 80% (PCI), AVCFX 30%, pRCA 30%, stent ok, 40-50, then 50% dist RCA;  c. s/p Promus DES to RI 12/25/10  . CAROTID BRUITS, BILATERAL 05/17/2010  . 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 Herpes simplex without mention of complication 01/16/2010  . HYPERLIPIDEMIA, MILD 08/29/2009  . HYPERTENSION 04/14/2007  . MIGRAINE HEADACHE 04/20/2008    . OSTEOPENIA 04/14/2007  . URINARY INCONTINENCE, STRESS, MILD 08/26/2007  . GERD (gastroesophageal reflux disease)   . Menopause   . Ovarian cyst     Current Outpatient Prescriptions  Medication Sig Dispense Refill  . aspirin 81 MG tablet Take 81 mg by mouth daily.        . clopidogrel (PLAVIX) 75 MG tablet Take 75 mg by mouth daily.        Marland Kitchen Dexlansoprazole (DEXILANT) 60 MG capsule Take 60 mg by mouth daily.       Marland Kitchen estradiol (VIVELLE-DOT) 0.05 MG/24HR Place 1 patch onto the skin. Change twice a week       . ipratropium-albuterol (DUONEB) 0.5-2.5 (3) MG/3ML SOLN Take 3 mLs by nebulization 3 (three) times daily.  360 mL    . nadolol (CORGARD) 20 MG tablet Take 20 mg by mouth 2 (two) times daily.        Marland Kitchen Phenylephrine-DM-GG (MUCINEX FAST-MAX CONGEST COUGH) 2.5-5-100 MG/5ML LIQD Take 5 mLs by mouth 4 (four) times daily as needed.  1 Bottle  prn  . PRISTIQ 100 MG 24 hr tablet TAKE ONE TABLET BY MOUTH EVERY DAY.  30 each  5  . rosuvastatin (CRESTOR) 40 MG tablet Take 40 mg by mouth daily.        Marland Kitchen tiotropium (SPIRIVA) 18 MCG inhalation capsule Place 18 mcg into inhaler and inhale daily.  Allergies  Allergen Reactions  . Codeine Sulfate     REACTION: unspecified  . Hydrocodone-Acetaminophen     REACTION: unspecified  . ZOX:[id number] Acid+Aspartame     REACTION: unspecified    Vital Signs: BP 138/89  Pulse 64  Ht 5\' 2"  (1.575 m)  Wt 146 lb (66.225 kg)  BMI 26.70 kg/m2  PHYSICAL EXAM: Well nourished, well developed, in no acute distress HEENT: normal Neck: no JVD Cardiac:  normal S1, S2; RRR; 2/6 systolic murmur at RUSB Lungs:  clear to auscultation bilaterally, no wheezing, rhonchi or rales Abd: soft, nontender, no hepatomegaly Ext: no edema; RFA and LFA sites without hematoma or bruit; left upper extremity with moderate sized hematoma overlying the lateral epicondyle with extensive ecchymosis Skin: warm and dry Neuro:  CNs 2-12 intact, no  focal abnormalities noted  EKG:  Normal sinus rhythm, heart rate 64, normal axis, nonspecific ST-T wave changes  ASSESSMENT AND PLAN:

## 2011-01-10 NOTE — Assessment & Plan Note (Signed)
Doing well post intervention to the ramus intermediate.  She will need to remain on aspirin and Plavix for a minimum of 12 months.  She does have a hematoma of her left arm.  This is improving.  I advised her to increase her aspirin back to once daily once this is resolved.  Follow up with Dr. Gala Romney in 3 months.

## 2011-01-10 NOTE — Assessment & Plan Note (Signed)
This is probably moderate.  It was felt that her AS was overestimated on recent echocardiogram.  She will likely need a follow up echo in the next 6-12 months to continue to monitor.  Follow up with Dr. Gala Romney in 3 months.

## 2011-01-10 NOTE — Assessment & Plan Note (Signed)
Lipids optimal during recent hospitalization. Managed by PCP.

## 2011-01-10 NOTE — Assessment & Plan Note (Signed)
Follow up due in November 2012.

## 2011-01-15 ENCOUNTER — Encounter: Payer: Self-pay | Admitting: Physician Assistant

## 2011-01-15 NOTE — H&P (Signed)
Katherine Foley, PAZOS                 ACCOUNT NO.:  [id number]  MEDICAL RECORD NO.:  [id number]           PATIENT TYPE:  I  LOCATION:  2602                         FACILITY:  [facility]  PHYSICIAN:  Cherene Altes, MD     DATE OF BIRTH:  1951-06-01  DATE OF ADMISSION:  12/21/2010 DATE OF DISCHARGE:                             HISTORY & PHYSICAL   PRIMARY CARDIOLOGIST:  Bevelyn Buckles. Bensimhon, MD  PRIMARY MD:  Rosalyn Gess. Norins, MD  CHIEF COMPLAINT:  Chest pain.  HISTORY OF PRESENT ILLNESS:  Ms. Anastos is a 60 year old female with a past medical history of coronary artery disease status post non-STEMI in August 2011 with PCI to mid RCA with a bare metal stent at that time. The patient presents to the emergency room complaining of 3 weeks of intermittent chest pain.  The patient states the pain occurs at rest and lasts for a few seconds.  She is unable to characterize the pain.  She states over the last week or two, she has also begun to have some shortness of breath that also occurs at rest though is not associated with the pain.  It is not relieved with her inhaler as her usual COPD exacerbation is.  Today, the patient states she had a more prolonged more severe episode of pain that was 8/10 with radiation to her back and associated dyspnea that was more consistent with a previous MI.  The patient states the pain is relieved by nitroglycerin at home.  On arrival to the emergency department, the patient was treated with a GI cocktail without relief of pain followed by nitroglycerin paste, heparin, and aspirin with the current pain level of 2/10.  PAST MEDICAL HISTORY: 1. Coronary artery disease status post non-STEMI in August 2011 with     BMS to mid RCA. 2. Hypertension. 3. COPD. 4. Hyperlipidemia. 5. Anxiety/depression. 6. Migraine. 7. GERD/Barrett's esophagus. 8. Ovarian cysts.  SOCIAL HISTORY:  Married.  Quit smoking 6 years ago.  No alcohol or drugs.  FAMILY HISTORY:  Mother  died of MI at advanced age.  ALLERGIES:  CODEINE, VICODIN, and AUGMENTIN.  MEDICATIONS: 1. Dexilant 1 tablet daily. 2. Crestor 20 mg daily. 3. Nadolol 20 mg b.i.d. 4. Aspirin 81 mg daily. 5. Spiriva daily. 6. Pristiq 100 mg daily. 7. Vivelle hormone 0.05 daily. 8. Dulera inhaler p.r.n.  REVIEW OF SYSTEMS:  The patient denies any fever, chills, headache, GI, or GU symptoms.  All other systems per HPI.  14-point systems reviewed except as described above.  PHYSICAL EXAMINATION:  VITAL SIGNS:  Temperature 97, pulse 62, respiratory rate 18, blood pressure 165/97, O2 sats 93% on room air. GENERAL:  A pleasant well-appearing, overweight white female, alert and oriented x3 in no acute distress. HEENT:  Normocephalic, atraumatic.  Extraocular movements intact. Mucous membranes moist. NECK:  Supple without lymphadenopathy.  No JVD. CARDIOVASCULAR:  Regular rate and rhythm.  Normal S1 and S2.  2/6 systolic ejection murmur heard in the left upper sternal border.  Pulses 2+ and equal bilaterally. LUNGS:  Clear to auscultation bilaterally with poor air entry.  No wheezes or rales.  SKIN:  No rash. ABDOMEN:  Benign. EXTREMITIES:  No cyanosis, clubbing, or edema. MUSCULOSKELETAL:  No bony deformity. NEURO:  Alert and oriented x3.  Cranial nerves II through XII intact.  Chest x-ray, chronic changes consistent with COPD.  No acute process. EKG, sinus rhythm at a rate of 61, no ST or T-wave changes.  LABORATORY DATA:  CBC is normal with normal differential.  Electrolytes and renal function are normal.  Cardiac enzymes negative x2.  IMPRESSION AND PLAN: 1. Unstable angina.  The patient's risk factors include history of     coronary artery disease with previous MI and bare metal stent to     RCA in August 2011, hypertension, hyperlipidemia, and age.  The     patient's pain syndrome is atypical.  Her EKG was normal and her     biomarkers are negative thus far.  The patient was treated with  GI     cocktail followed by nitroglycerin, heparin, and aspirin in the     emergency department with no resolution of her pain.  For now, we     will monitor the patient on telemetry and follow her with serial     EKG and cardiac enzymes.  In addition, we will continue heparin,     aspirin, beta-blocker, statin, and nitroglycerin.  If the patient's     biomarkers become positive, we will load the patient with Plavix.     We will consider cath versus stress testing in the morning based on     further information 2. Hypertension.  The patient's blood pressure was elevated on     arrival.  We will monitor for now and titrate meds as needed for a     goal blood pressure of less than 130/80.  Continue blood nadolol     for now. 3. Hyperlipidemia.  Continue Crestor.  Fasting lipid panel pending. 4. Chronic obstructive pulmonary disease.  Continue home meds. 5. Gastroesophageal reflux disease.  Continue Dexilant.     Cherene Altes, MD     PS/MEDQ  D:  12/21/2010  T:  12/22/2010  Job:  [id number]  Electronically Signed by Cherene Altes M.D. on 01/15/2011 08:24:06 PM

## 2011-01-19 NOTE — Discharge Summary (Signed)
NAME:  Katherine Foley, Katherine Foley                 ACCOUNT NO.:  [id number]   MEDICAL RECORD NO.:  [id number]          PATIENT TYPE:  INP   LOCATION:  1614                         FACILITY:  [facility]   PHYSICIAN:  Rosalyn Gess. Norins, MD  DATE OF BIRTH:  06-02-1951   DATE OF ADMISSION:  10/29/2006  DATE OF DISCHARGE:  11/05/2006                               DISCHARGE SUMMARY   ADMITTING DIAGNOSIS:  1. Acute exacerbation of chronic obstructive pulmonary disease with      bronchitis.  2. History of Barrett's esophagus.  3. History of tachycardia and palpitations.  4. Chest wall discomfort.  5. Depression.   DISCHARGE DIAGNOSIS:  1. Exacerbation of chronic obstructive pulmonary disease, improved      with the patient being now on steroid taper and needing home      oxygen.  2. Gastrointestinal, patient with Barrett's esophagus stable on proton      pump inhibitor.  3. Tachycardia, stable and resolved.  4. Chest wall pain, myocardial infarction ruled out  5. Depression, stable.   CONSULTANTS:  Charlaine Dalton. Sherene Sires, MD, for the pulmonary service.   PROCEDURES:  1. CT angiogram on 11/02/2006 with no evidence of pulmonary embolism      with patchy bilateral airspace disease thought to be atelectasis      versus subclinical mucus plugging versus asthma.  2. Plain chest x-ray 11/02/2006 with small patchy air space opacities.   HISTORY OF THE PRESENT ILLNESS:  The patient is 60 year old Caucasian  woman with a 1-week history of cough, congestion, and sore throat that  progressed to wheezing and shortness of breath with hypoxemia in the ER.  She was given handheld nebulizer treatments in the emergency department  and discharged home on prednisone burst and taper with doxycycline and  Ventolin MDI.  She returned to the ER reporting she was still wheezing  and could not sleep.  She was found to be short of breath, with a  respiratory rate of 22.  She had a room air saturation of 88%; and had  mildly cyanotic  extremities and subsequently was admitted to hospital  for treatment.   PAST MEDICAL HISTORY SIGNIFICANT FOR:  1. COPD.  2. Hypertension.  3. Tachycardia.  4. Hormone replacement therapy.  5. Depression.  6. Barrett's esophagus.   MEDICATIONS AT ADMISSION:  1. Toprol XL 100 mg daily.  2. Zoloft 100 mg daily.  3. Climara patch 0.1 mg applied weekly.  4. AcipHex 20 mg b.i.d.   PHYSICAL EXAM:  At admission by Dr. Lovell Sheehan revealed a temperature of  101, respiratory rate 22.  LUNGS:  Revealed diffuse rales with poor air  reserve and air trapping by his exam.  HEART:  Rate showed a regular  rate and rhythm with frequent PVCs.   HOSPITAL COURSE BY PROBLEM:  Problem #1.  PULMONARY.  The patient was  admitted, started on antibiotics with Rocephin and azithromycin.  She  continued to be very tight; was seen bolt upright in bed feeling short  of breath.  The patient was seen in consultation by pulmonary who felt  the patient  had exacerbation of her COPD.  I did feel that we needed to  rule out PE which was done.  It was thought that the patient was having  acute bronchospasm versus possible laryngeal spasm.  She was, because of  her respiratory distress, transferred to the intensive care unit by Dr.  Shelle Iron.  The patient had a very nice response to therapy with ongoing  antibiotics, high-dose IV steroids, and oxygen support.  The patient did  require a brief treatment with BiPAP, but was able to return to nasal  oxygen.  The patient continued to improve regimen on her regimen of  steroids and antibiotics.  She was able to be transferred from the floor  to a regular bed.  The patient continued to make progress in terms of  comfort and ability to breathe.  The patient did have O2 saturations  evaluated; and was found to have on room air O2 saturation of 84%; and  an O2 saturation of 79% on room air with ambulation performed on  11/04/2006.  The patient, on the day of discharge, had clear  lungs  moving air without difficulty; did not have any sensation of shortness  of breath, and no hypoxemia on oxygen.  With the patient being stable  and past the acute phase and on oral antibiotics, the point, at this  point, will be discharged home to continue prednisone burst and taper;  and to complete a course of Avelox.  She will require home oxygen  including portable oxygen for ambulation until seen in follow-up in the  office.   Problems #2 through #4 were stable required no required no additional  intervention or treatment.   DISCHARGE EXAMINATION:  VITAL SIGNS:  Temperature 99.2, blood pressure  157/93, pulse 55, respirations 18, and O2 saturation is 94% on 4 liters.  Room air O2 saturation with ambulation 79%, room air O2 saturation 84%  at rest on 11/04/2006.  GENERAL APPEARANCE:  A well-nourished woman in no acute distress.  HEENT:  Exam is unremarkable.  CHEST:  Patient is moving air well.  She had no rales, wheezes or  rhonchi.  She had no increased work of breathing.  No neck retractions,  no use of accessory muscles.  CARDIOVASCULAR:  2+ radial pulse.  She had a regular rate and rhythm on  my exam.  ABDOMEN:  Soft without guarding, rebound or tenderness.   FINAL LABORATORY:  CBC from March 1 with a white count of 7800,  hemoglobin of 10.3 grams.  Final basic metabolic panel on 11/02/2006 with  the sodium of 138, potassium 3.4, chloride 98, CO2 34, BUN 6, creatinine  0.51, glucose was 121.  The patient had b-natriuretic peptide that was  310 on February 29.  Legionella antigen was negative.  The patient was  negative for strep.  D-dimer on 02/28 was 0.69 and cardiac enzymes were  negative.   DISCHARGE MEDICATIONS:  The patient will resume her home medications  including:  1. Toprol XL 1 mg daily.  2. Zoloft 100 mg daily.  3. Climara patch 0.1 mg applied weekly.  4. AcipHex 20 mg b.i.d.. 5. She will continue on prednisone burst and taper at 30 mg p.o.      b.i.d.  for 2 days, then 40 mg daily times three, then 20 mg daily      times three, then 10 mg daily times six, and actually continue this      until she is seen in follow-up.  6. The  patient will require one more dose of Avelox after today's dose      for a total five doses of p.o. Avelox completing her antibiotic      therapy.  7. The patient with home oxygen at 2 liters per nasal cannula and will      need portable oxygen as well.   DISPOSITION:  The patient is to be discharged home.  Her condition at  the time of discharge dictation is stable and improved.  She is to  follow up with her primary care physician in 7-10 days.  The patient's  condition at the time of this discharge dictation is stable and  improved.      Rosalyn Gess Norins, MD  Electronically Signed     MEN/MEDQ  D:  11/05/2006  T:  11/05/2006  Job:  [id number]

## 2011-01-22 ENCOUNTER — Ambulatory Visit (INDEPENDENT_AMBULATORY_CARE_PROVIDER_SITE_OTHER): Payer: BC Managed Care – PPO | Admitting: Internal Medicine

## 2011-01-22 ENCOUNTER — Encounter: Payer: Self-pay | Admitting: Internal Medicine

## 2011-01-22 VITALS — BP 130/84 | HR 64 | Temp 98.7°F | Ht 62.5 in | Wt 152.0 lb

## 2011-01-22 DIAGNOSIS — I2510 Atherosclerotic heart disease of native coronary artery without angina pectoris: Secondary | ICD-10-CM

## 2011-01-22 DIAGNOSIS — E785 Hyperlipidemia, unspecified: Secondary | ICD-10-CM

## 2011-01-22 DIAGNOSIS — I10 Essential (primary) hypertension: Secondary | ICD-10-CM

## 2011-01-22 NOTE — Progress Notes (Signed)
Subjective:    Patient ID: Katherine Foley, female    DOB: December 14, 1950, 60 y.o.   MRN: [id number]  HPI patient is a 60 year old white female who presents for followup of bruising from her Plavix and aspirin regimen she has stent placed which required her to be on Plavix and aspirin for a minimum of one year post stent.  Her significant hematoma has resolved with rest and compression.  She now has easy bruising but no further large hematomas have appeared.  She will return to work with no limitations but observation if she starts to swell and spot to use compression and ice as soon as possible    Review of Systems  Constitutional: Negative for activity change, appetite change and fatigue.  HENT: Negative for ear pain, congestion, neck pain, postnasal drip and sinus pressure.   Eyes: Negative for redness and visual disturbance.  Respiratory: Negative for cough, shortness of breath and wheezing.   Gastrointestinal: Negative for abdominal pain and abdominal distention.  Genitourinary: Negative for dysuria, frequency and menstrual problem.  Musculoskeletal: Negative for myalgias, joint swelling and arthralgias.  Skin: Negative for rash and wound.  Neurological: Negative for dizziness, weakness and headaches.  Hematological: Negative for adenopathy. Does not bruise/bleed easily.  Psychiatric/Behavioral: Negative for sleep disturbance and decreased concentration.   Past Medical History  Diagnosis Date  . ANXIETY DEPRESSION 02/15/2009  . AORTIC STENOSIS 07/07/2010    a. echo 12/23/10: EF 60-65%, mod to severe AS with mean gradient 29 mmHg  . Barrett's esophagus 04/14/2007  . CAD, NATIVE VESSEL 05/17/2010    a. NSTEMI 8/11 - BMS to RCA;  b. cath 4/12: dLAD 50%, RI 80% (PCI), AVCFX 30%, pRCA 30%, stent ok, 40-50, then 50% dist RCA;  c. s/p Promus DES to RI 12/25/10  . Carotid stenosis 05/17/2010    a. dopplers 11/11: RICA 40-50%; LICA 0-39% (follow up due 11/12)  . CHRONIC OBSTRUCTIVE PULMONARY  DISEASE, MILD 07/23/2007  . Herpes simplex without mention of complication 01/16/2010  . HYPERLIPIDEMIA, MILD 08/29/2009  . HYPERTENSION 04/14/2007  . MIGRAINE HEADACHE 04/20/2008  . OSTEOPENIA 04/14/2007  . URINARY INCONTINENCE, STRESS, MILD 08/26/2007  . GERD (gastroesophageal reflux disease)   . Menopause   . Ovarian cyst    Past Surgical History  Procedure Date  . Total abdominal hysterectomy   . Tonsillectomy and adenoidectomy   . Rhinoplasty     reports that she has quit smoking. She does not have any smokeless tobacco history on file. She reports that she does not drink alcohol or use illicit drugs. family history includes COPD in her father; Coronary artery disease in her other; and Heart disease in her father, maternal uncle, mother, and other. Allergies  Allergen Reactions  . Codeine Sulfate     REACTION: unspecified  . Hydrocodone-Acetaminophen     REACTION: unspecified  . VHQ:[id number]+[id number]+[id number] Acid+Aspartame     REACTION: unspecified        Objective:   Physical Exam  Constitutional: She is oriented to person, place, and time. She appears well-developed and well-nourished. No distress.  HENT:  Head: Normocephalic and atraumatic.  Right Ear: External ear normal.  Left Ear: External ear normal.  Nose: Nose normal.  Mouth/Throat: Oropharynx is clear and moist.  Eyes: Conjunctivae and EOM are normal. Pupils are equal, round, and reactive to light.  Neck: Normal range of motion. Neck supple. No JVD present. No tracheal deviation present. No thyromegaly present.  Cardiovascular: Normal rate, regular rhythm, normal heart sounds and intact distal  pulses.   No murmur heard. Pulmonary/Chest: Effort normal and breath sounds normal. She has no wheezes. She exhibits no tenderness.  Abdominal: Soft. Bowel sounds are normal.  Musculoskeletal: Normal range of motion. She exhibits no edema and no tenderness.  Lymphadenopathy:    She has no cervical adenopathy.    Neurological: She is alert and oriented to person, place, and time. She has normal reflexes. No cranial nerve deficit.  Skin: Skin is warm and dry. She is not diaphoretic.  Psychiatric: She has a normal mood and affect. Her behavior is normal.          Assessment & Plan:  Blood pressure is well-controlled cholesterol as a cold we reviewed her cholesterol values with her.  The oozing from the Plavix and aspirin has resolved however the risks still persists we discussed treatment if bruising occurs.   I have spent more than 30 minutes examining this patient face-to-face of which over half was spent in counseling

## 2011-01-22 NOTE — Assessment & Plan Note (Signed)
Blood pressure stable on current medication

## 2011-01-22 NOTE — Assessment & Plan Note (Signed)
Excellent cholesterol results. 

## 2011-01-22 NOTE — Assessment & Plan Note (Signed)
Discussion of hematomas that she had related to the use of Plavix and aspirin after that catheterization showed one vessel disease.  She had a stent placed and was placed on Plavix and aspirin when she returned to work she had a significant hematoma developed on her arms from localized trauma.  That has resolved and we discussed prevention of hematomas by the use of compression when a bruise starts to appear.  It'll be necessary for her to remain on the Plavix and aspirin for 4 year after her stent

## 2011-02-01 NOTE — Cardiovascular Report (Signed)
NAME:  [name], Foley                 ACCOUNT NO.:  [id number]  MEDICAL RECORD NO.:  [id number]           PATIENT TYPE:  O  LOCATION:  6529                         FACILITY:  [facility]  PHYSICIAN:  Katherine Morton. Riley Kill, MD, FACCDATE OF BIRTH:  03-16-1951  DATE OF PROCEDURE:  12/22/2010 DATE OF DISCHARGE:                           CARDIAC CATHETERIZATION   INDICATIONS:  Katherine Foley is a very delightful 60 year old who previously presented with a non-ST-elevation MI.  She underwent stenting of the right coronary artery.  At that time, she had modest disease of the ramus intermedius which was treated medically.  She was also noted to have aortic stenosis of moderate degree.  She presents now with a 3-week history of increasing discomfort.  Her enzymes are negative.  The current study is done to assess coronary anatomy.  Risks, benefits, alternatives were discussed with the patient and she consented to proceed.  It should be noted that in the last catheterization, she became unresponsive, the etiology of this was never found.  She underwent a PCI the following day.  In the interim, she was also noted to have a cystic pelvic mass.  This was ultimately resected.  [name].  She has been followed in the office by Dr. Arvilla Foley, and the echocardiograms have demonstrated a valve area that is described as anywhere from 0.6-0.84 cm2, although the gradients have been somewhat lower with a peak of about 40 and a mean in the mid 20s.  Her EKG does not suggest left ventricular hypertrophy.  PROCEDURES: 1. Placement of catheters for angiography without left heart     catheterization. 2. Selective coronary arteriography. 3. Aortography.  DESCRIPTION OF PROCEDURE:  The patient was brought to the cath lab and prepped and draped in the usual fashion.  We entered the right femoral artery and a 4-French sheath was placed.  Views of the left  coronary artery were obtained, views of the right coronary artery were obtained. Following this, we did attempt across the aortic valve, given the very fairly small gradient.  We were unable to do so.  An aortic root angiogram was obtained.  All catheters were subsequently removed and she was removed to the holding area for convalescent care.  There were no major complications.  HEMODYNAMIC DATA:  The central aortic pressure is 155/86 with a mean of 104.  ANGIOGRAPHIC DATA: 1. On plain fluoroscopy, there is moderate calcification of the aortic     leaflets. 2. Aortography demonstrates dilated aortic root.  This is of moderate     size.  CT scan obtained less than a year ago suggested 4 cm.  The     aortic leaflets are calcified.  There is some limited excursion.     Significant aortic regurgitation is not noted. 3. The left main is free of critical disease. 4. The LAD courses to the apex.  It has mild luminal irregularity and     about 50% stenosis distally.  This is segmental.  The diagonal was     without critical narrowing. 5. The circumflex demonstrates a  ramus intermedius vessel with a very     focal lesion of about 80%.  This is fairly optimal for percutaneous     intervention.  The AV circumflex has about 30% narrowing and it     provides 2 large marginal systems without critical narrowing. 6. The right coronary artery has been previously stented.  There is     about 30% narrowing proximally, then a long area of stenting with     less than about 30% narrowing throughout the stent.  There is 40-     50% narrowing just distal to the stent and then 50% narrowing in     distal portion of the vessel.  None represent peri-stent areas.     The distal vessel is widely patent.  CONCLUSION: 1. Known aortic valve stenosis of moderately severe-to-severe degree. 2. Continued patency of the previously placed stent in the right     coronary artery. 3. Focal lesion in the mid circumflex  coronary artery.  DISPOSITION:  After review of the findings, I would suggest that we repeat the echocardiogram.  The focal lesion in the circumflex can be easily stented if the aortic stenosis is not progressed significantly. If it has, then consideration might need to be given to an aortic valve replacement.  It is hard to tell at this point it has been 6 months since her last echocardiogram, and at that time, the valve area was under 1 cm2.     Katherine Morton. Riley Kill, MD, [facility]     TDS/MEDQ  D:  12/22/2010  T:  12/23/2010  Job:  [id number]  cc:   Katherine Buckles. Bensimhon, MD [facility]  Electronically Signed by Shawnie Pons MD [facility] on 02/01/2011 09:15:30 AM

## 2011-02-19 ENCOUNTER — Other Ambulatory Visit: Payer: Self-pay | Admitting: Internal Medicine

## 2011-02-22 ENCOUNTER — Other Ambulatory Visit: Payer: Self-pay | Admitting: Internal Medicine

## 2011-03-07 ENCOUNTER — Other Ambulatory Visit: Payer: Self-pay | Admitting: Internal Medicine

## 2011-04-03 ENCOUNTER — Encounter: Payer: Self-pay | Admitting: Internal Medicine

## 2011-04-11 ENCOUNTER — Ambulatory Visit (INDEPENDENT_AMBULATORY_CARE_PROVIDER_SITE_OTHER): Payer: BC Managed Care – PPO | Admitting: Internal Medicine

## 2011-04-11 ENCOUNTER — Encounter: Payer: Self-pay | Admitting: Internal Medicine

## 2011-04-11 ENCOUNTER — Telehealth: Payer: Self-pay | Admitting: *Deleted

## 2011-04-11 VITALS — BP 136/82 | HR 63 | Ht 62.0 in | Wt 149.4 lb

## 2011-04-11 DIAGNOSIS — I2510 Atherosclerotic heart disease of native coronary artery without angina pectoris: Secondary | ICD-10-CM

## 2011-04-11 DIAGNOSIS — I359 Nonrheumatic aortic valve disorder, unspecified: Secondary | ICD-10-CM

## 2011-04-11 DIAGNOSIS — R072 Precordial pain: Secondary | ICD-10-CM

## 2011-04-11 NOTE — Telephone Encounter (Signed)
Patient called back and scheduled office visit to discuss recall colonoscopy.

## 2011-04-11 NOTE — Progress Notes (Signed)
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 woman former smoker with CAD, s/p NSTEMI 8/11 treated with a bare-metal stent in to the RCA and aortic stenosis.  She was admitted 4/19-4/24 with unstable angina.  Myocardial infarction was ruled out.  Cardiac catheterization demonstrated patent stent in the RCA.  She was noted to have a high-grade lesion in the ramus intermediate at 80%.  Echocardiogram was performed prior to proceeding with PCI to ensure that her aortic stenosis had not significantly progressed.  The echocardiogram demonstrated an EF of 60-65%, moderate to severe aortic stenosis with a mean gradient of 29 mm of mercury. AVA 0.6cm2  PCI was performed on 4/23 with placement of a Promus drug-eluting stent to the ramus intermediate.  She had no immediate complications and her post PCI course was uneventful.  During cath Dr. Riley Kill was unable to cross the AoV.    After PCI did well for a few months but now notes episodes chest pressure worse when she lies down but also with exertion. Feels they are getting more frequent. Similar to previous angina. Also getting pain in throat. + increasing dyspnea on mild exertion. She says if she tries to carry anything she gets exhausted and SOB.  She denies orthopnea, PND or edema.  She denies syncope.   Past Medical History  Diagnosis Date  . ANXIETY DEPRESSION 02/15/2009  . AORTIC STENOSIS 07/07/2010    a. echo 12/23/10: EF 60-65%, mod to severe AS with mean gradient 29 mmHg  . Barrett's esophagus 04/14/2007  . CAD, NATIVE VESSEL 05/17/2010    a. NSTEMI 8/11 - BMS to RCA;  b. cath 4/12: dLAD 50%, RI 80% (PCI), AVCFX 30%, pRCA 30%, stent ok, 40-50, then 50% dist RCA;  c. s/p Promus DES to RI 12/25/10  . Carotid stenosis 05/17/2010    a. dopplers 11/11: RICA 40-50%; LICA 0-39% (follow up due 11/12)  . 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 Herpes simplex without mention of complication 01/16/2010   . HYPERLIPIDEMIA, MILD 08/29/2009  . HYPERTENSION 04/14/2007  . MIGRAINE HEADACHE 04/20/2008  . OSTEOPENIA 04/14/2007  . URINARY INCONTINENCE, STRESS, MILD 08/26/2007  . GERD (gastroesophageal reflux disease)   . Menopause   . Ovarian cyst     Current Outpatient Prescriptions  Medication Sig Dispense Refill  . acyclovir (ZOVIRAX) 400 MG tablet TAKE ONE TABLET BY MOUTH THREE TIMES DAILY AS NEEDED FOR COLD SORE  30 tablet  1  . aspirin 81 MG tablet Take 81 mg by mouth daily.        . clopidogrel (PLAVIX) 75 MG tablet Take 75 mg by mouth daily.        . CRESTOR 40 MG tablet TAKE ONE TABLET BY MOUTH EVERY DAY  30 each  8  . DEXILANT 60 MG capsule TAKE ONE CAPSULE BY MOUTH EVERY DAY  30 capsule  11  . estradiol (VIVELLE-DOT) 0.05 MG/24HR Place 1 patch onto the skin. Change twice a week       . mometasone-formoterol (DULERA) 100-5 MCG/ACT AERO Inhale 2 puffs into the lungs as directed.        . nadolol (CORGARD) 20 MG tablet Take 20 mg by mouth 2 (two) times daily.        Marland Kitchen PRISTIQ 100 MG 24 hr tablet TAKE ONE TABLET BY MOUTH EVERY DAY.  30 each  5  . tiotropium (SPIRIVA) 18 MCG inhalation capsule Place 18 mcg into inhaler and inhale daily.  Allergies  Allergen Reactions  . Codeine Sulfate     REACTION: unspecified  . Hydrocodone-Acetaminophen     REACTION: unspecified  . ZOX:[id number]+XBJYNWGNF+AOZHYQMVHQ Acid+Aspartame     REACTION: unspecified    Vital Signs: BP 136/82  Pulse 63  Ht 5\' 2"  (1.575 m)  Wt 149 lb 6.4 oz (67.767 kg)  BMI 27.33 kg/m2  PHYSICAL EXAM: Well nourished, well developed, in no acute distress HEENT: normal Neck: no JVD Cardiac:  normal S1. S2 only minimally decreased; RRR; 2/6 systolic murmur at RUSB Lungs:  clear to auscultation bilaterally, no wheezing, rhonchi or rales Abd: soft, nontender, no hepatomegaly Ext: no c/c/e Skin: warm and dry Neuro:  CNs 2-12 intact, no focal abnormalities noted  EKG:  Normal sinus rhythm, heart rate 64,  normal axis, nonspecific ST-T wave changes  ASSESSMENT AND PLAN:

## 2011-04-11 NOTE — Telephone Encounter (Signed)
Patient's last colonoscopy was completed on 02/19/05 and showed adenomatous colon polyps. Patient is overdue for recall colonoscopy. Patient will need an office visit prior to a procedure since she is on Plavix. I have left a message for patient to call back.

## 2011-04-11 NOTE — Patient Instructions (Signed)
Your physician recommends that you return for lab work in: TOMORROW (bmet, cbc, inr)  Your physician has requested that you have a cardiac catheterization. Cardiac catheterization is used to diagnose and/or treat various heart conditions. Doctors may recommend this procedure for a number of different reasons. The most common reason is to evaluate chest pain. Chest pain can be a symptom of coronary artery disease (CAD), and cardiac catheterization can show whether plaque is narrowing or blocking your heart's arteries. This procedure is also used to evaluate the valves, as well as measure the blood flow and oxygen levels in different parts of your heart. For further information please visit [email or web address] Please follow instruction sheet, as given.  Your physician recommends that you schedule a follow-up appointment in: 3 months.

## 2011-04-12 ENCOUNTER — Other Ambulatory Visit (INDEPENDENT_AMBULATORY_CARE_PROVIDER_SITE_OTHER): Payer: BC Managed Care – PPO | Admitting: *Deleted

## 2011-04-12 ENCOUNTER — Encounter: Payer: Self-pay | Admitting: *Deleted

## 2011-04-12 DIAGNOSIS — I359 Nonrheumatic aortic valve disorder, unspecified: Secondary | ICD-10-CM

## 2011-04-12 DIAGNOSIS — R072 Precordial pain: Secondary | ICD-10-CM

## 2011-04-12 LAB — CBC WITH DIFFERENTIAL/PLATELET
Eosinophils Relative: 0 % (ref 0.0–5.0)
HCT: 37 % (ref 36.0–46.0)
Hemoglobin: 11.9 g/dL — ABNORMAL LOW (ref 12.0–15.0)
Lymphs Abs: 0.8 10*3/uL (ref 0.7–4.0)
MCV: 90.7 fl (ref 78.0–100.0)
Monocytes Absolute: 0.4 10*3/uL (ref 0.1–1.0)
Monocytes Relative: 9.2 % (ref 3.0–12.0)
Neutro Abs: 3.2 10*3/uL (ref 1.4–7.7)
WBC: 4.4 10*3/uL — ABNORMAL LOW (ref 4.5–10.5)

## 2011-04-12 LAB — BASIC METABOLIC PANEL
Chloride: 95 mEq/L — ABNORMAL LOW (ref 96–112)
GFR: 109.77 mL/min (ref >60.00)
Glucose, Bld: 102 mg/dL — ABNORMAL HIGH (ref 70–99)
Potassium: 4.7 mEq/L (ref 3.5–5.1)
Sodium: 132 mEq/L — ABNORMAL LOW (ref 135–145)

## 2011-04-13 ENCOUNTER — Ambulatory Visit (HOSPITAL_COMMUNITY)
Admission: RE | Admit: 2011-04-13 | Discharge: 2011-04-13 | Disposition: A | Discharge status: Home / Self Care | Payer: BC Managed Care – PPO | Source: Ambulatory Visit | Attending: Internal Medicine | Admitting: Internal Medicine

## 2011-04-13 DIAGNOSIS — Z9861 Coronary angioplasty status: Secondary | ICD-10-CM | POA: Insufficient documentation

## 2011-04-13 DIAGNOSIS — I359 Nonrheumatic aortic valve disorder, unspecified: Secondary | ICD-10-CM | POA: Insufficient documentation

## 2011-04-13 DIAGNOSIS — I2510 Atherosclerotic heart disease of native coronary artery without angina pectoris: Secondary | ICD-10-CM

## 2011-04-13 DIAGNOSIS — R079 Chest pain, unspecified: Secondary | ICD-10-CM | POA: Insufficient documentation

## 2011-04-13 DIAGNOSIS — J449 Chronic obstructive pulmonary disease, unspecified: Secondary | ICD-10-CM | POA: Insufficient documentation

## 2011-04-13 DIAGNOSIS — J4489 Other specified chronic obstructive pulmonary disease: Secondary | ICD-10-CM | POA: Insufficient documentation

## 2011-04-13 DIAGNOSIS — R072 Precordial pain: Secondary | ICD-10-CM | POA: Insufficient documentation

## 2011-04-13 LAB — POCT I-STAT 3, ART BLOOD GAS (G3+)
Bicarbonate: 25.3 mEq/L — ABNORMAL HIGH (ref 20.0–24.0)
O2 Saturation: 91 %
TCO2: 27 mmol/L (ref 0–100)
pCO2 arterial: 47.4 mmHg — ABNORMAL HIGH (ref 35.0–45.0)
pO2, Arterial: 65 mmHg — ABNORMAL LOW (ref 80.0–100.0)

## 2011-04-13 LAB — POCT I-STAT 3, VENOUS BLOOD GAS (G3P V)
Acid-base deficit: 1 mmol/L (ref 0.0–2.0)
Acid-base deficit: 1 mmol/L (ref 0.0–2.0)
Bicarbonate: 26 mEq/L — ABNORMAL HIGH (ref 20.0–24.0)
O2 Saturation: 59 %
TCO2: 28 mmol/L (ref 0–100)

## 2011-04-13 NOTE — Assessment & Plan Note (Signed)
As above. Plan cath Friday.

## 2011-04-13 NOTE — Assessment & Plan Note (Signed)
Symptoms concerning for progressive angina. May be related to CAD or aortic stenosis however AS does not sound critical on exam.  Will need cath to evaluate both her coronary anatomy and degree of AS. We will schedule cath for Friday.

## 2011-04-13 NOTE — Assessment & Plan Note (Signed)
Moderate to severe by echo but doesn't sound critical on exam. Will evaluate during cath.

## 2011-04-18 ENCOUNTER — Encounter: Payer: Self-pay | Admitting: Thoracic Surgery (Cardiothoracic Vascular Surgery)

## 2011-04-18 ENCOUNTER — Institutional Professional Consult (permissible substitution) (INDEPENDENT_AMBULATORY_CARE_PROVIDER_SITE_OTHER): Payer: BC Managed Care – PPO | Admitting: Thoracic Surgery (Cardiothoracic Vascular Surgery)

## 2011-04-18 DIAGNOSIS — I359 Nonrheumatic aortic valve disorder, unspecified: Secondary | ICD-10-CM

## 2011-04-18 DIAGNOSIS — I350 Nonrheumatic aortic (valve) stenosis: Secondary | ICD-10-CM

## 2011-04-18 DIAGNOSIS — I2510 Atherosclerotic heart disease of native coronary artery without angina pectoris: Secondary | ICD-10-CM

## 2011-04-19 NOTE — Consult Note (Signed)
NEW PATIENT CONSULTATION  Katherine Foley, Katherine Foley DOB:  October 30, 1950                                        April 18, 2011 CHART #:  [id number]  The patient is a 60 year old woman who presents with a chief complaint of chest pain.  The patient is a 60 year old woman whose history of cardiac disease dates back to August 2011.  When she presented with a non-ST elevation MI, she was treated with a bare-metal stent to her right coronary.  She was felt at that time to have a heart murmur and aortic stenosis.  Her symptoms improved significantly.  She did well for awhile, but presented back in April 2012, with unstable angina.  At that time, she had a high- grade lesion in the ramus intermedius 80%, that was treated with a drug- eluting stent.  She had been maintained on aspirin and Plavix.  Of note, catheterization at that time, they were unable to cross the aortic valve.  She had an echocardiogram then which showed severe aortic stenosis with a mean gradient of 29 mmHg and an aortic valve area of 0.6 cm2.  This was significantly worse than her echo in November 2011, which showed a valve area of 0.84 cm2.  She again initially had a good result after the percutaneous intervention, but now is having more frequent and severe chest discomfort.  She describes her discomfort as a pressure in the chest and a feeling as if there is a lump in her throat.  She also gets some pain in the midportion of her back.  She says she sometimes gets this just with lying down, but it never wakes her up from her sleep, but it is more commonly with exertion, and she has very consistently with exertion.  Her bedroom is upstairs at home and when she walks up the stairs, every time she has the same degree of shortness of breath and chest discomfort.  She has been diagnosed with mild COPD and is on medication for that, but notes that rescue inhaler do not improve the symptoms or prevent symptoms from  happening.  She underwent repeat cardiac catheterization on August 10 by Dr. Gala Romney.  At that time, she was found to have patent stents.  She had a 50% tubular stenosis in the midportion of her LAD.  Her LV gradient was 20, PA pressure was 45/15 with a mean of 29, wedge was 25.  Cardiac index was 3.3.  Aortic pressure was 135/76.  LV pressure 160/17.  Again, the mean gradient was 20.  Aortic valve area calculated at 1 cm2.  PAST MEDICAL HISTORY:  Significant for coronary artery disease, previous PTCA with bare-metal stent to right coronary artery in August 2011, Promus drug-eluting stent to ramus intermedius in April 2012, aortic stenosis, anxiety, depression, gastroesophageal reflux with Barrett esophagus, carotid stenosis 40-50% on the right and 0-39% on the left, mild COPD, herpes simplex virus, hyperlipidemia, hypertension, migraines, osteopenia, stress urinary incontinence, ovarian cyst.  She is postmenopausal.  CURRENT MEDICATIONS: 1. Aspirin 81 mg daily. 2. Plavix 75 mg daily. 3. Acyclovir 400 mg 3 times daily as needed. 4. Crestor 40 mg daily. 5. Dexilant 60 mg daily. 6. Estradiol 0.05 mg/24 hours 1 patch twice weekly. 7. Dulera two puffs p.r.n. 8. Corgard 20 mg b.i.d. 9. Pristiq 100 mg daily. 10.Spiriva 18 mcg daily.  ALLERGIES:  She has an allergy to CODEINE, HYDROCODONE, and AMOXICILLIN.  FAMILY HISTORY:  Strongly positive.  Mother died of heart disease.  She also had an uncle and brother who had significant problems.  SOCIAL HISTORY:  She is single.  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  She does not smoke.  She smoked about a pack a day but quit in 2006.  REVIEW OF SYSTEMS:  Chest pain, shortness of breath with exertion as noted, heart murmur noted first a year ago, asthma, gastroesophageal reflux, hiatal hernia.  Se does sometimes feels some sensation of food sticking, but does not happen all the time.  Denies any stroke or TIA symptoms.  No  history of blood clots.  No history of excessive bleeding or bruising, but does have frequent bruising since she had been started on Plavix.  PHYSICAL EXAMINATION:  The patient is a 60 year old woman, in no acute distress.  Her blood pressure is 142/88, pulse is 66, respirations 16, oxygen saturation 96% on room air.  Neurologically, she is alert and oriented x3 with no focal deficits.  She does seem very anxious.  HEENT exam is unremarkable.  Neck has a right carotid bruit.  Cardiac exam has regular rate and rhythm with a 3/6 systolic murmur.  Lungs are clear. No rales or wheezing.  Abdomen is soft, nontender.  Extremities are without clubbing, cyanosis, or edema.  She has palpable pulses.  LABORATORY DATA:  Cardiac catheterization and echocardiogram reviewed.  IMPRESSION:  The patient is a 60 year old woman with exertional chest and neck discomfort, it is primarily exertional though she does sometimes feels when she lies down as well.  She has not had any other symptoms of waking her up from her sleep and does not have it at resting, while sitting down.  There is some components of this that such as the lying down component which could potentially be reflux, however, she does have a strong exertional component and is very consistent with exertion which is far more likely to be cardiac in origin.  Based on her echocardiogram in April 2012, she has severe aortic stenosis with valve area of 0.6 and based on her symptomatology, that would certainly be consistent with the aortic valve being the primary cause.  The only thing brings that in the question somewhat is the cardiac catheterization data where her mean gradient was 20, her valve area was 1 which is still significantly tight, but is not critically tight at 0.6.  I think we need to combine her clinical presentation, the findings at catheterization, and the echocardiogram that aortic valve replacement is indicated.  I did discuss  this issue with her in great detail that the conflicting numbers between the echo and the catheterization and explained to her our options, one of which would be to repeat her echo to see if she could be treated medically, however, I would favor based on the degree of her symptomatology, taking the aortic stenosis very seriously, and going ahead and treating that.  I also discussed with her the finding of 50% stenosis in her LAD that should be addressed at the time of her aortic valve replacement with bypass.  I discussed in detail with her the indications, risks, benefits, and alternatives which include medical therapy.  She understands the risks of surgery include but not limited to death, stroke, MI, DVT, PE, bleeding, possible need for transfusions, infections as well as other organ system dysfunction including respiratory, renal, hepatic, or GI complications.  She understands  those risks and accepts them and agrees to proceed.  We did discuss our valve options including mechanical and tissue valve. She understands relative advantages and disadvantages of each approach. She understands the general cut off at age 55 with mechanical valves for folks younger than that and tissue valves when she passed age 72, but there is considerable overlap.  She does understand the need for lifelong anticoagulation with mechanical valve and already has some significant issues with bruising and is very concerned about being on anticoagulation and strongly favors a tissue valve even understanding that could require a redo aortic valve replacement or other procedures in the future.  The patient wishes to proceed with surgery which we have scheduled for Tuesday, May 01, 2011.  She will be admitted on the day of surgery. She would need to stop her Plavix on Thursday, April 26, 2011, in preparation for surgery.  All of her questions were answered, but I did give her our number, so that she  can call if she were to have any further questions.  Salvatore Decent [name], M.D. Electronically Signed  SCH/MEDQ  D:  04/18/2011  T:  04/19/2011  Job:  [id number]  cc:   Bevelyn Buckles. Bensimhon, MD Stacie Glaze, MD

## 2011-04-24 ENCOUNTER — Ambulatory Visit: Payer: BC Managed Care – PPO | Admitting: Internal Medicine

## 2011-04-27 ENCOUNTER — Ambulatory Visit (HOSPITAL_COMMUNITY)
Admission: RE | Admit: 2011-04-27 | Discharge: 2011-04-27 | Disposition: A | Discharge status: Home / Self Care | Payer: BC Managed Care – PPO | Source: Ambulatory Visit | Attending: Thoracic Surgery (Cardiothoracic Vascular Surgery) | Admitting: Thoracic Surgery (Cardiothoracic Vascular Surgery)

## 2011-04-27 ENCOUNTER — Other Ambulatory Visit: Payer: Self-pay | Admitting: Thoracic Surgery (Cardiothoracic Vascular Surgery)

## 2011-04-27 ENCOUNTER — Encounter (HOSPITAL_COMMUNITY)
Admission: RE | Admit: 2011-04-27 | Discharge: 2011-04-27 | Disposition: A | Discharge status: Home / Self Care | Payer: BC Managed Care – PPO | Source: Ambulatory Visit | Attending: Thoracic Surgery (Cardiothoracic Vascular Surgery) | Admitting: Thoracic Surgery (Cardiothoracic Vascular Surgery)

## 2011-04-27 DIAGNOSIS — I6529 Occlusion and stenosis of unspecified carotid artery: Secondary | ICD-10-CM | POA: Insufficient documentation

## 2011-04-27 DIAGNOSIS — I2510 Atherosclerotic heart disease of native coronary artery without angina pectoris: Secondary | ICD-10-CM | POA: Insufficient documentation

## 2011-04-27 DIAGNOSIS — Z01812 Encounter for preprocedural laboratory examination: Secondary | ICD-10-CM | POA: Insufficient documentation

## 2011-04-27 DIAGNOSIS — J438 Other emphysema: Secondary | ICD-10-CM | POA: Insufficient documentation

## 2011-04-27 DIAGNOSIS — R079 Chest pain, unspecified: Secondary | ICD-10-CM | POA: Insufficient documentation

## 2011-04-27 DIAGNOSIS — R0602 Shortness of breath: Secondary | ICD-10-CM | POA: Insufficient documentation

## 2011-04-27 DIAGNOSIS — Z01810 Encounter for preprocedural cardiovascular examination: Secondary | ICD-10-CM

## 2011-04-27 DIAGNOSIS — I359 Nonrheumatic aortic valve disorder, unspecified: Secondary | ICD-10-CM | POA: Insufficient documentation

## 2011-04-27 LAB — URINALYSIS, ROUTINE W REFLEX MICROSCOPIC
Glucose, UA: NEGATIVE mg/dL
Ketones, ur: NEGATIVE mg/dL
Leukocytes, UA: NEGATIVE
Nitrite: NEGATIVE
Protein, ur: NEGATIVE mg/dL
pH: 7 (ref 5.0–8.0)

## 2011-04-27 LAB — PROTIME-INR
INR: 0.92 (ref 0.00–1.49)
Prothrombin Time: 12.6 seconds (ref 11.6–15.2)

## 2011-04-27 LAB — BLOOD GAS, ARTERIAL
Acid-Base Excess: 1.1 mmol/L (ref 0.0–2.0)
TCO2: 26.4 mmol/L (ref 0–100)
pCO2 arterial: 39.8 mmHg (ref 35.0–45.0)
pH, Arterial: 7.417 — ABNORMAL HIGH (ref 7.350–7.400)
pO2, Arterial: 93.2 mmHg (ref 80.0–100.0)

## 2011-04-27 LAB — APTT: aPTT: 29 seconds (ref 24–37)

## 2011-04-27 LAB — CBC
HCT: 36 % (ref 36.0–46.0)
Hemoglobin: 12 g/dL (ref 12.0–15.0)
MCHC: 33.3 g/dL (ref 30.0–36.0)
RDW: 13.7 % (ref 11.5–15.5)
WBC: 6.8 10*3/uL (ref 4.0–10.5)

## 2011-04-27 LAB — COMPREHENSIVE METABOLIC PANEL
ALT: 15 U/L (ref 0–35)
Albumin: 3.5 g/dL (ref 3.5–5.2)
Alkaline Phosphatase: 55 U/L (ref 39–117)
Chloride: 99 mEq/L (ref 96–112)
Potassium: 4.7 mEq/L (ref 3.5–5.1)
Sodium: 131 mEq/L — ABNORMAL LOW (ref 135–145)
Total Bilirubin: 0.2 mg/dL — ABNORMAL LOW (ref 0.3–1.2)
Total Protein: 6.3 g/dL (ref 6.0–8.3)

## 2011-04-27 LAB — SURGICAL PCR SCREEN: MRSA, PCR: NEGATIVE

## 2011-04-27 LAB — HEMOGLOBIN A1C: Mean Plasma Glucose: 131 mg/dL — ABNORMAL HIGH (ref <117)

## 2011-05-01 ENCOUNTER — Other Ambulatory Visit: Payer: Self-pay | Admitting: Thoracic Surgery (Cardiothoracic Vascular Surgery)

## 2011-05-01 ENCOUNTER — Inpatient Hospital Stay (HOSPITAL_COMMUNITY)
Admission: RE | Admit: 2011-05-01 | Discharge: 2011-05-06 | DRG: 545 | Disposition: A | Discharge status: Home / Self Care | Payer: BC Managed Care – PPO | Source: Ambulatory Visit | Attending: Thoracic Surgery (Cardiothoracic Vascular Surgery) | Admitting: Thoracic Surgery (Cardiothoracic Vascular Surgery)

## 2011-05-01 ENCOUNTER — Inpatient Hospital Stay (HOSPITAL_COMMUNITY): Payer: BC Managed Care – PPO

## 2011-05-01 DIAGNOSIS — K2270 Barrett's esophagus without dysplasia: Secondary | ICD-10-CM | POA: Diagnosis present

## 2011-05-01 DIAGNOSIS — K219 Gastro-esophageal reflux disease without esophagitis: Secondary | ICD-10-CM | POA: Diagnosis present

## 2011-05-01 DIAGNOSIS — I4901 Ventricular fibrillation: Secondary | ICD-10-CM | POA: Diagnosis not present

## 2011-05-01 DIAGNOSIS — I2510 Atherosclerotic heart disease of native coronary artery without angina pectoris: Secondary | ICD-10-CM | POA: Diagnosis present

## 2011-05-01 DIAGNOSIS — Z7982 Long term (current) use of aspirin: Secondary | ICD-10-CM

## 2011-05-01 DIAGNOSIS — Z7902 Long term (current) use of antithrombotics/antiplatelets: Secondary | ICD-10-CM

## 2011-05-01 DIAGNOSIS — I10 Essential (primary) hypertension: Secondary | ICD-10-CM | POA: Diagnosis present

## 2011-05-01 DIAGNOSIS — I252 Old myocardial infarction: Secondary | ICD-10-CM

## 2011-05-01 DIAGNOSIS — J4489 Other specified chronic obstructive pulmonary disease: Secondary | ICD-10-CM | POA: Diagnosis present

## 2011-05-01 DIAGNOSIS — I359 Nonrheumatic aortic valve disorder, unspecified: Secondary | ICD-10-CM

## 2011-05-01 DIAGNOSIS — Z01812 Encounter for preprocedural laboratory examination: Secondary | ICD-10-CM

## 2011-05-01 DIAGNOSIS — Z01818 Encounter for other preprocedural examination: Secondary | ICD-10-CM

## 2011-05-01 DIAGNOSIS — F411 Generalized anxiety disorder: Secondary | ICD-10-CM | POA: Diagnosis present

## 2011-05-01 DIAGNOSIS — Q231 Congenital insufficiency of aortic valve: Principal | ICD-10-CM | POA: Diagnosis present

## 2011-05-01 DIAGNOSIS — D62 Acute posthemorrhagic anemia: Secondary | ICD-10-CM | POA: Diagnosis not present

## 2011-05-01 DIAGNOSIS — M899 Disorder of bone, unspecified: Secondary | ICD-10-CM | POA: Diagnosis present

## 2011-05-01 DIAGNOSIS — Z01810 Encounter for preprocedural cardiovascular examination: Secondary | ICD-10-CM

## 2011-05-01 DIAGNOSIS — G43909 Migraine, unspecified, not intractable, without status migrainosus: Secondary | ICD-10-CM | POA: Diagnosis present

## 2011-05-01 DIAGNOSIS — J449 Chronic obstructive pulmonary disease, unspecified: Secondary | ICD-10-CM | POA: Diagnosis present

## 2011-05-01 LAB — CBC
HCT: 25.9 % — ABNORMAL LOW (ref 36.0–46.0)
HCT: 24.6 % — ABNORMAL LOW (ref 36.0–46.0)
Hemoglobin: 8.2 g/dL — ABNORMAL LOW (ref 12.0–15.0)
MCH: 29.1 pg (ref 26.0–34.0)
MCHC: 33.6 g/dL (ref 30.0–36.0)
MCHC: 33.3 g/dL (ref 30.0–36.0)
Platelets: 113 10*3/uL — ABNORMAL LOW (ref 150–400)
RDW: 14 % (ref 11.5–15.5)
RDW: 14 % (ref 11.5–15.5)
WBC: 7.3 10*3/uL (ref 4.0–10.5)

## 2011-05-01 LAB — POCT I-STAT 3, ART BLOOD GAS (G3+)
Acid-base deficit: 2 mmol/L (ref 0.0–2.0)
Acid-base deficit: 2 mmol/L (ref 0.0–2.0)
Acid-base deficit: 3 mmol/L — ABNORMAL HIGH (ref 0.0–2.0)
Acid-base deficit: 1 mmol/L (ref 0.0–2.0)
Acid-base deficit: 1 mmol/L (ref 0.0–2.0)
Bicarbonate: 23.9 mEq/L (ref 20.0–24.0)
Bicarbonate: 24.5 mEq/L — ABNORMAL HIGH (ref 20.0–24.0)
O2 Saturation: 100 %
O2 Saturation: 96 %
O2 Saturation: 98 %
O2 Saturation: 97 %
Patient temperature: 36.4
Patient temperature: 37.1
TCO2: 25 mmol/L (ref 0–100)
TCO2: 26 mmol/L (ref 0–100)
pH, Arterial: 7.242 — ABNORMAL LOW (ref 7.350–7.400)
pO2, Arterial: 368 mmHg — ABNORMAL HIGH (ref 80.0–100.0)
pO2, Arterial: 106 mmHg — ABNORMAL HIGH (ref 80.0–100.0)

## 2011-05-01 LAB — PLATELET COUNT: Platelets: 82 10*3/uL — ABNORMAL LOW (ref 150–400)

## 2011-05-01 LAB — POCT I-STAT 4, (NA,K, GLUC, HGB,HCT)
Glucose, Bld: 99 mg/dL (ref 70–99)
Glucose, Bld: 88 mg/dL (ref 70–99)
Glucose, Bld: 76 mg/dL (ref 70–99)
Glucose, Bld: 96 mg/dL (ref 70–99)
Glucose, Bld: 102 mg/dL — ABNORMAL HIGH (ref 70–99)
Glucose, Bld: 105 mg/dL — ABNORMAL HIGH (ref 70–99)
HCT: 22 % — ABNORMAL LOW (ref 36.0–46.0)
Hemoglobin: 9.9 g/dL — ABNORMAL LOW (ref 12.0–15.0)
Hemoglobin: 7.1 g/dL — ABNORMAL LOW (ref 12.0–15.0)
Potassium: 4.1 mEq/L (ref 3.5–5.1)
Potassium: 4.9 mEq/L (ref 3.5–5.1)
Potassium: 4.8 mEq/L (ref 3.5–5.1)
Potassium: 4 mEq/L (ref 3.5–5.1)
Sodium: 138 mEq/L (ref 135–145)
Sodium: 133 mEq/L — ABNORMAL LOW (ref 135–145)
Sodium: 136 mEq/L (ref 135–145)
Sodium: 139 mEq/L (ref 135–145)
Sodium: 142 mEq/L (ref 135–145)

## 2011-05-01 LAB — POCT I-STAT, CHEM 8
Creatinine, Ser: 0.5 mg/dL (ref 0.50–1.10)
Glucose, Bld: 135 mg/dL — ABNORMAL HIGH (ref 70–99)
HCT: 24 % — ABNORMAL LOW (ref 36.0–46.0)
Hemoglobin: 8.2 g/dL — ABNORMAL LOW (ref 12.0–15.0)
TCO2: 25 mmol/L (ref 0–100)

## 2011-05-01 LAB — GLUCOSE, CAPILLARY
Glucose-Capillary: 138 mg/dL — ABNORMAL HIGH (ref 70–99)
Glucose-Capillary: 128 mg/dL — ABNORMAL HIGH (ref 70–99)
Glucose-Capillary: 138 mg/dL — ABNORMAL HIGH (ref 70–99)

## 2011-05-01 LAB — PROTIME-INR
INR: 1.57 — ABNORMAL HIGH (ref 0.00–1.49)
Prothrombin Time: 19.1 seconds — ABNORMAL HIGH (ref 11.6–15.2)

## 2011-05-01 NOTE — Cardiovascular Report (Signed)
NAMENEKA, BISE                 ACCOUNT NO.:  [id number]  MEDICAL RECORD NO.:  [id number]  LOCATION:  [facility]                         FACILITY:  [facility]  PHYSICIAN:  Bevelyn Buckles. Bensimhon, MDDATE OF BIRTH:  February 04, 1951  DATE OF PROCEDURE:  04/13/2011 DATE OF DISCHARGE:                           CARDIAC CATHETERIZATION   PATIENT IDENTIFICATION:  Katherine Foley is a 60 year old woman who was a former smoker.  She also has a history of anxiety and coronary artery disease. She was treated with a bare-metal stent to the RCA in the past.  In April 2012, she was admitted with unstable angina.  She ruled out for myocardial infarction.  She underwent catheterization, which showed an 80% ramus lesion.  There was also made of moderate aortic stenosis by echo with a mean gradient of 29.  However, the valve area was 0.6. After a long discussion, it was decided to proceed with angioplasty of her ramus branch.  This was successful.  She was treated with a Promus drug-eluting stent.  She says she did well for several weeks but then had recurrent chest pain both at rest and with exertion.  I saw her in the office and we sent her for repeat cardiac catheterization to look at her ramus stent and also attempt across the aortic valve and directly measure her gradients and valve area.  PROCEDURES PERFORMED: 1. Right heart cath. 2. Selective coronary angiography. 3. Left heart cath.  DESCRIPTION OF PROCEDURE:  The risks and indication were explained. Consent was signed and placed on the chart.  A 6-French arterial catheter was placed in the right femoral artery using modified Seldinger technique.  A 7-French venous sheath was placed in the right femoral vein using a similar technique.  We used Swan-Ganz catheter for the right heart cath.  We used a JL-4 and JR-4 for her coronary angiography.  In order to cross the valve, we used the right coronary catheter and a long straight exchange wire.  We crossed the  valve with only a moderate amount of difficulty.  We then changed out for a J-wire which was placed in the ventricle and we changed out the right coronary catheter for a Langston dual-lumen catheter.  All catheter exchanges were made over wire.  There were no apparent complications.  Right atrial pressure mean of 18, RV pressure 47/15, with an EDP of 20.  PA catheter 45/15 with a mean of 29. Pulmonary capillary wedge pressure mean of 25.  Fick cardiac output 5.6. Cardiac index 3.3.  Central aortic pressure 135/76 with a mean of 100. LV pressure 160/17 with EDP of 26.  The aortic valve had a mean gradient of 20.  The aortic valve area was calculated at 1.0 sq cm.  Pulmonary vascular resistance was 0.7 Woods.  Pulmonary artery saturations were 59 and 66.  Left main was normal.  LAD coursed the apex, gave off a diagonal branch, there is a 50% tubular lesion in the midsection.  Left circumflex gave off a large ramus two marginal branches.  There was a 30% lesion in the proximal AV groove circ.  There was mild diffuse plaque distally.  In the ramus branch, the stent was widely  patent.  Right coronary artery was a dominant vessel, gave off an RV branch, PDA and posterolateral.  There is a diffuse 30% plaquing proximally in the stent.  In the midsection, it was patent.  There is just 20%-30% in- stent restenosis.  After the stent in the distal segment, there is about 40%-50% stenosis in the RCA.  ASSESSMENT: 1. Stable coronary artery disease with patent stents as described     above. 2. Moderate-to-severe aortic stenosis. 3. Increased right-sided pressures with normal PVR. 4. Normal ejection fraction by echocardiogram.  PLAN/DISCUSSION:  Based on her numbers, I suspect her aortic stenosis is likely severe enough to warrant aortic valve replacement.  However, I am not sure to explain all her symptoms as I would not predicted to produce chest pain at rest.  I suspect she may have some  component of COPD as well.  We will check PFTs.  She does have moderately elevated right atrial pressure which seems a bit out of proportion to her left-sided pressures and I wonder if she may have some subtle right ventricular dysfunction as well.  However, it did not appear this way on the echocardiogram.  I will discuss her case with the cardiothoracic surgeons.  Whether or not, she would be a candidate for an AVR and possible LIMA to the LAD.  She did have a drug-eluting stent in April 2012 and so the timing would need to be worked out.  I have discussed this with Dr. Riley Kill who feels that 4-6 months of dual-antiplatelet therapy should be sufficient and we should be able to take her to surgery in the near future.     Bevelyn Buckles. Bensimhon, MD     DRB/MEDQ  D:  04/13/2011  T:  04/13/2011  Job:  [id number]  Electronically Signed by Arvilla Meres MD on 05/01/2011 05:24:45 PM

## 2011-05-02 ENCOUNTER — Inpatient Hospital Stay (HOSPITAL_COMMUNITY): Payer: BC Managed Care – PPO

## 2011-05-02 LAB — GLUCOSE, CAPILLARY
Glucose-Capillary: 124 mg/dL — ABNORMAL HIGH (ref 70–99)
Glucose-Capillary: 124 mg/dL — ABNORMAL HIGH (ref 70–99)
Glucose-Capillary: 138 mg/dL — ABNORMAL HIGH (ref 70–99)

## 2011-05-02 LAB — MAGNESIUM: Magnesium: 2.2 mg/dL (ref 1.5–2.5)

## 2011-05-02 LAB — CREATININE, SERUM
Creatinine, Ser: 0.58 mg/dL (ref 0.50–1.10)
GFR calc Af Amer: >60 mL/min (ref >60)
GFR calc non Af Amer: >60 mL/min (ref >60)

## 2011-05-02 LAB — PREPARE PLATELET PHERESIS: Unit division: 0

## 2011-05-02 LAB — POCT I-STAT, CHEM 8
BUN: 15 mg/dL (ref 6–23)
Chloride: 99 mEq/L (ref 96–112)
Potassium: 4.4 mEq/L (ref 3.5–5.1)
Sodium: 137 mEq/L (ref 135–145)
TCO2: 25 mmol/L (ref 0–100)

## 2011-05-02 LAB — BASIC METABOLIC PANEL
CO2: 26 mEq/L (ref 19–32)
Calcium: 6.7 mg/dL — ABNORMAL LOW (ref 8.4–10.5)
Glucose, Bld: 114 mg/dL — ABNORMAL HIGH (ref 70–99)
Sodium: 140 mEq/L (ref 135–145)

## 2011-05-02 LAB — CBC
HCT: 22.7 % — ABNORMAL LOW (ref 36.0–46.0)
Hemoglobin: 7.4 g/dL — ABNORMAL LOW (ref 12.0–15.0)
Hemoglobin: 9.1 g/dL — ABNORMAL LOW (ref 12.0–15.0)
MCH: 28.7 pg (ref 26.0–34.0)
MCHC: 32.6 g/dL (ref 30.0–36.0)
MCV: 88 fL (ref 78.0–100.0)
Platelets: 130 10*3/uL — ABNORMAL LOW (ref 150–400)
RBC: 3.11 MIL/uL — ABNORMAL LOW (ref 3.87–5.11)
WBC: 10.3 10*3/uL (ref 4.0–10.5)

## 2011-05-03 ENCOUNTER — Inpatient Hospital Stay (HOSPITAL_COMMUNITY): Payer: BC Managed Care – PPO

## 2011-05-03 ENCOUNTER — Ambulatory Visit: Payer: BC Managed Care – PPO | Admitting: Internal Medicine

## 2011-05-03 LAB — TYPE AND SCREEN: Unit division: 0

## 2011-05-03 LAB — GLUCOSE, CAPILLARY
Glucose-Capillary: 113 mg/dL — ABNORMAL HIGH (ref 70–99)
Glucose-Capillary: 115 mg/dL — ABNORMAL HIGH (ref 70–99)
Glucose-Capillary: 90 mg/dL (ref 70–99)

## 2011-05-03 LAB — BASIC METABOLIC PANEL
BUN: 14 mg/dL (ref 6–23)
Calcium: 7.3 mg/dL — ABNORMAL LOW (ref 8.4–10.5)
Glucose, Bld: 111 mg/dL — ABNORMAL HIGH (ref 70–99)
Sodium: 134 mEq/L — ABNORMAL LOW (ref 135–145)

## 2011-05-03 LAB — CBC
HCT: 26.9 % — ABNORMAL LOW (ref 36.0–46.0)
Hemoglobin: 8.9 g/dL — ABNORMAL LOW (ref 12.0–15.0)
MCH: 29.2 pg (ref 26.0–34.0)
MCHC: 33.1 g/dL (ref 30.0–36.0)
RDW: 15 % (ref 11.5–15.5)

## 2011-05-04 ENCOUNTER — Inpatient Hospital Stay (HOSPITAL_COMMUNITY): Payer: BC Managed Care – PPO

## 2011-05-04 LAB — CBC
HCT: 28.1 % — ABNORMAL LOW (ref 36.0–46.0)
Hemoglobin: 9.3 g/dL — ABNORMAL LOW (ref 12.0–15.0)
WBC: 5.9 10*3/uL (ref 4.0–10.5)

## 2011-05-04 LAB — BASIC METABOLIC PANEL
BUN: 8 mg/dL (ref 6–23)
CO2: 33 mEq/L — ABNORMAL HIGH (ref 19–32)
Chloride: 100 mEq/L (ref 96–112)
Glucose, Bld: 112 mg/dL — ABNORMAL HIGH (ref 70–99)
Potassium: 4.5 mEq/L (ref 3.5–5.1)

## 2011-05-06 ENCOUNTER — Inpatient Hospital Stay (HOSPITAL_COMMUNITY): Payer: BC Managed Care – PPO

## 2011-05-06 LAB — BASIC METABOLIC PANEL
BUN: 9 mg/dL (ref 6–23)
Calcium: 8.9 mg/dL (ref 8.4–10.5)
Creatinine, Ser: <0.47 mg/dL — ABNORMAL LOW (ref 0.50–1.10)
Glucose, Bld: 100 mg/dL — ABNORMAL HIGH (ref 70–99)

## 2011-05-06 LAB — CBC
HCT: 25.3 % — ABNORMAL LOW (ref 36.0–46.0)
Hemoglobin: 8.4 g/dL — ABNORMAL LOW (ref 12.0–15.0)
MCH: 29.6 pg (ref 26.0–34.0)
MCHC: 33.2 g/dL (ref 30.0–36.0)

## 2011-05-14 ENCOUNTER — Encounter: Payer: Self-pay | Admitting: Internal Medicine

## 2011-05-15 NOTE — Op Note (Addendum)
NAMEKELLYE, MIZNER                 ACCOUNT NO.:  [id number]  MEDICAL RECORD NO.:  [id number]  LOCATION:  XRAY                         FACILITY:  [facility]  PHYSICIAN:  Salvatore Decent. [name], M.D.DATE OF BIRTH:  19-Aug-1951  DATE OF PROCEDURE:  05/01/2011 DATE OF DISCHARGE:  04/27/2011                              OPERATIVE REPORT   Ms. Zipp is a 60 year old woman with history of coronary artery disease and a known heart murmur.  She now presents with exertional chest and neck discomfort, although she does sometimes experiencing the symptoms when lying down as well.  She does have COPD, but a cardiac workup including echocardiogram in the spring revealed severe aortic stenosis with valve area of 0.6-cm.  She underwent cardiac catheterization where the mean gradient was 20.  Her valve area calculated at 1.0 cm2 and she did have a 50% LAD stenosis.  The patient was offered aortic valve replacement and coronary artery bypass grafting.  She did understand that there could be some contribution to her symptoms from her COPD. The indications, risks, benefits and alternatives were discussed in detail with the patient, she understood and accepted the risks and agreed to proceed.  We did discuss valve options including mechanical and tissue valve and relative advantages and disadvantages of these.  She strongly desired not to have need for lifelong anticoagulation, did not wish to have a mechanical valve placed.  Therefore, a tissue valve was to be utilized.  OPERATIVE NOTE:  Ms. Varin was brought to the preop holding area on May 01, 2011, there the Anesthesia Service placed a Swan-Ganz catheter and arterial blood pressure monitoring line.  Intravenous antibiotics were administered.  She was taken to the operating room, anesthetized and intubated.  Transesophageal echocardiography was performed by Dr. Sheldon Silvan.  Please see his separately dictated note for details of the procedure.  She  did have severe aortic stenosis with a functionally bicuspid valve with fusion of the right and left coronary cusps.  There was left ventricular hypertrophy.  Overall, preserved left ventricular systolic function.  There was no other valvular pathology. A Foley catheter was placed.  The chest, abdomen and legs were prepped and draped in the usual sterile fashion.  A median sternotomy was performed.  The left internal mammary artery was harvested using standard technique.  This was somewhat difficult due to hyperinflation of the left lung with adjustments to the tidal volumes and ventilatory rate and adequate visualization was possible.  The patient was heparinized prior to the distal end of the mammary artery.  There was excellent flow through the cut into the vessel.  The pericardium was opened after confirming adequate anticoagulation with ACT measurement.  The aorta was cannulated via concentric 2-0 Ethibond pledgeted pursestring sutures.  A dual stage venous cannula was placed via pursestring suture in the rectal appendage.  Cardiopulmonary bypass was instituted.  Anticoagulation was monitored with ACT measurement.  Flows were maintained per protocol.  The LAD was inspected and anastomotic site was chosen.  The mammary was inspected and beveled. The left ventricular vent was placed via pursestring suture in the right superior pulmonary vein.  Retrograde cardioplegic cannula was placed via pursestring suture  in the right atrium and directly into the coronary sinus and antegrade cardioplegic cannula placed in the ascending aorta.  The aorta was crossclamped.  The left ventricle was emptied via the vents.  Cardiac arrest then was achieved with combination of cold antegrade and retrograde blood cardioplegia and topical iced saline. Initial 500 mL of cardioplegia was administered with antegrade.  There was a rapid diastolic arrest.  The remainder of the cardioplegia was administered via  the retrograde cannula that was myocardial septal cooling to less than 10 degrees Celsius.  The distal end of the left internal mammary artery then was anastomosed end-to-side to the distal LAD.  The LAD was a 1.5-mm good-quality target at the site of the anastomosis.  The mammary was a 1.5-mm good-quality conduit.  Anastomosis was performed end-to-side with a running 8-0 Prolene suture.  At the completion of the mammary to LAD anastomosis, the bulldog clamp was briefly removed to inspect for hemostasis.  Septal rewarming was noted.  The bulldog clamp was replaced.  The mammary pedicle was tacked with epicardial surface of the heart with 6-0 Prolene sutures.  Additional cardioplegia was administered and aortotomy was performed just above the sinotubular junction.  It was extended down into the noncoronary sinus of Valsalva.  The aortic valve was inspected. The noncoronary leaflet was relatively enlarged.  The right and left leaflets were fused at the common commissure.  There was heavy calcification particularly in the noncoronary leaflet.  There was mild annular calcification.  The leaflets were excised.  The annular calcium was debrided.  Care was taken to control all debris.  The annulus was copiously irrigated with iced saline.  Additional cardioplegia was administered at a 20-minute intervals during the valve replacement.  The annulus sized for a 21-mm Edwards magna ease pericardial valve.  A 2- 0 Ethibond horizontal mattress sutures with subannular pledgets were placed circumferentially at the annulus.  The valve was prepared per manufacturer's recommendations.  After the valve had been rinsed, sutures were passed through the sewing of the valve that was lowered into place and the sutures were sequentially tied.  The valve seated well.  The annulus was probed with a fine tip right-angle.  No gaps were noted.  The coronary ostia were well clear at the valve with no evidence of  impingement.  Rewarming was begun.  The aortotomy was closed in 2 layers with running 4-0 Prolene horizontal mattress suture followed by running 4-0 Prolene simple suture.  After completing the initial layer, de-airing maneuvers were performed.  After completing the second layer of the aortic closure, the patient was placed in Trendelenburg position. Additional de-airing maneuvers were performed.  A warm dose of retrograde cardioplegia was administered.  The bulldog clamp was then removed from the left mammary artery and the aortic crossclamp was removed.  The crossclamp time was 76 minutes.  The patient initially was in ventricular fibrillation.  A single defibrillation with 20 joules was administered.  Additional lidocaine was administered and the patient subsequently converted to sinus rhythm. While rewarming was completed, the aortotomy and distal anastomoses were inspected for hemostasis.  Epicardial pacing wires were placed on the right ventricle and right atrium and DDD pacing was initiated.  When the patient reached a core temperature of 37 degrees Celsius, the lungs were reinflated with intent to wean from cardiopulmonary bypass.  However, there was marked hyperinflation particularly at the left lung in comparison to the right.  There was bleeding from the mammary distal anastomosis.  Inspection of  this revealed this could not be repaired with the heart beating and the patient would require re-crossclamping and cardioplegic arrest of the heart to repair the disruption of the anastomosis, likely caused secondary to tension from hyperinflation of the left lung.  During this time period,  Dr. Ivin Booty from the Anesthesia Service adjusted the endotracheal tube position and performed bronchoscopy and administered bronchodilators and steroids in an attempt to help the air trapping.  The aorta was re-crossclamped.  Cardiac arrest was achieved with cold antegrade blood cardioplegia.   After achieving a complete diastolic arrest and adequate septal cooling, the left internal mammary artery distal anastomosis was inspected.  There was a relatively large disruption of the anastomosis that was evident.  This needed to be taken down and redone rather than try to place a repair stitch, therefore, the knots were cut and sutures removed.  The mammary was divided just proximal to the previous insertion site beveled and anastomosed end-to- side to the LAD with a running 8-0 Prolene suture.  At the completion of this anastomosis, the bulldog clamp was removed.  Again, rapid septal rewarming was noted.  Mammary pedicle was again tacked with epicardial surface of the heart with 6-0 Prolene sutures.  Three relaxing incisions were made in the fascia overlying the mammary artery.  The left ventricular pacing wires were replaced and DOL pacing was reinitiated. The second crossclamp time had been 20 minutes.  At this point, the lungs were reinflated taking care to avoid hyperinflation.  The patient then weaned from cardiopulmonary bypass on the first attempt.  Total bypass time was 157 minutes.  The initial cardiac index was greater than 2 L per minute per meter squared and the patient remained hemodynamically stable throughout the post bypass. Postbypass transesophageal echocardiography revealed good function of the prosthetic valve with no perivalvular leaks and no change in left ventricular systolic function.  The test dose of protamine was administered and was well tolerated.  The atrial and the aortic cannulae were removed as was the aortic root vent.  The remainder of the protamine was administered without incident.  The chest was irrigated with warm saline.  Hemostasis was achieved.  The pericardium was not closed.  A left pleural and single mediastinal chest tube were placed through separate subcostal incisions.  The sternum was closed with a combination of single and double  heavy gauge stainless steel wires.  The pectoralis fascia, subcutaneous tissue and skin were closed in standard fashion.  All sponge, needle and instrument counts were correct at the end of the procedure.  The patient was taken from the operating room to the Surgical Intensive Care Unit in fair condition.     Salvatore Decent [name], M.D.     SCH/MEDQ  D:  05/01/2011  T:  05/01/2011  Job:  [id number]  cc:   Bevelyn Buckles. Bensimhon, MD Stacie Glaze, MD  Electronically Signed by Charlett Lango M.D. on 05/25/2011 01:41:44 PM

## 2011-05-15 NOTE — Discharge Summary (Signed)
NAMEGISELE, PACK                 ACCOUNT NO.:  [id number]  MEDICAL RECORD NO.:  [id number]  LOCATION:  2009                         FACILITY:  [facility]  PHYSICIAN:  Salvatore Decent. [name], M.D.DATE OF BIRTH:  1951/01/17  DATE OF ADMISSION:  05/01/2011 DATE OF DISCHARGE:  05/06/2011                              DISCHARGE SUMMARY   HISTORY:  The patient is a 60 year old female who was referred to Dr. [name] for coronary artery disease and aortic stenosis.  Her history of cardiac disease dates back to August 2011 when she presented with a non-ST-segment elevation myocardial infarction and was treated with a bare-metal stent to her right coronary artery.  She was felt at that time to have a heart murmur of aortic stenosis.  Her symptoms improved significantly.  She did well for a while, but in April 2012, she presented with unstable angina.  At that time, she had a high-grade lesion in the ramus intermedius of 80%, that was treated with a drug- eluting stent.  She has maintained on aspirin and Plavix.  No catheterization at that time as they were unable to cross the aortic valve.  She had an echocardiogram which showed severe aortic stenosis and a mean gradient of 29 mmHg and an aortic valve area of 0.6 cm2. This was significantly worse than her echo of November 2011 which showed the area to be 0.84 cm.  She again initially had a good result after percutaneous intervention, but did develop more frequent and severe chest discomfort.  She again had repeat cardiac catheterization in August 2012 by Dr. Gala Romney.  At that time, her stents were found to be patent.  She did have 50% tubular stenosis in the midpoint of the LAD. Her LV gradient was 20 and PA pressure was 45/15 with a mean of 29 and a wedge pressure of 25.  Cardiac index was 3.3.  Again, the aortic valve area was calculated as 1.0 centimeters squared.  In referral to Dr. [name], he has evaluated the patient and her  studies and agreed with the recommendations to proceed with aortic valve replacement as well as coronary artery bypass grafting to the LAD.  She was admitted this hospitalization for the procedure.  PAST MEDICAL HISTORY:  Coronary artery disease as described above. Other diagnoses include the following; 1. Aortic stenosis. 2. Anxiety. 3. Depression. 4. Gastroesophageal reflux. 5. History of Barrett esophagus. 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 7. Mild chronic obstructive pulmonary disease. 8. History of hyperlipidemia. 9. Hypertension. 10.History of migraines. 11.History of osteopenia. 12.History of stress urinary incontinence. 13.History of ovarian cysts.  MEDICATIONS PRIOR TO ADMISSION: 1. Aspirin 81 mg daily. 2. Plavix 75 mg daily. 3. Acyclovir 400 mg three times daily p.r.n. 4. Crestor 40 mg daily. 5. Dexilant 60 mg daily./ 6. Estradiol 0.05 mg patch twice weekly. 7. Dulera 2 puffs p.r.n. 8. Corgard 20 mg b.i.d. 9. Pristiq 100 mg daily. 10.Spiriva 18 mcg daily.  ALLERGIES:  CODEINE, HYDROCODONE, and AMOXICILLIN.  FAMILY HISTORY, SOCIAL HISTORY, REVIEW OF SYSTEMS, AND PHYSICAL EXAMINATION:  Please see the history and physical done  at the time of admission.  HOSPITAL COURSE:  The patient was admitted electively on May 01, 2011.  She underwent aortic valve replacement with a 21 mm Magna Ease pericardial tissue valve.  Additionally, she had coronary artery bypass grafting x1 with a left internal mammary artery to the LAD.  She tolerated the procedure well and was taken to the surgical intensive care unit in stable condition.  POSTOPERATIVE HOSPITAL COURSE:  The patient has progressed nicely overall.  She has maintained stable hemodynamics.  She does have some volume overload but is responding well with diuretics.  Inotropes were weaned without difficulty.  She was  weaned from the ventilator without difficulty.  She has remained neurologically intact.  She does have an acute blood loss anemia.  Her values have stabilized.  Her most recent hemoglobin/hematocrit dated May 06, 2011 were 8.4 and 25.3 respectively.  Electrolytes, BUN, and creatinine are within normal limits.  Oxygen has been weaned and she maintains good saturations on room air.  Incisions are healing well without evidence of infection. She has maintained sinus rhythm without significant cardiac dysrhythmias.  She is tolerating gradual increase in activity using standard cardiac rehabilitation protocols.  She does have bilateral pleural effusions but it appears to be responding well to diuretics. There is some athletic atelectasis component finding on her chest x-ray, but this was also stable and improving overtime.  Her overall status is felt to be stable for discharge on today's date, May 06, 2011.  MEDICATIONS ON DISCHARGE:  At the time of this dictation include the following; 1. Lasix 40 mg daily for an additional 10 days. 2. Guaifenesin p.r.n. 3. Metoprolol XL 25 mg daily. 4. Oxycodone 5 mg 1-2 every 4-6 hours p.r.n. for pain. 5. Potassium chloride 20 mEq daily for 10 days. 6. Aspirin 81 mg daily. 7. Crestor 40 mg daily. 8. Dexilant 60 mg daily. 9. Dulera 200 mcg one puff daily p.r.n. 10.Plavix 75 mg daily. 11.Pristiq 100 mg daily. 12.Spiriva 18 mcg daily. 13.Vivelle-Dot 0.05 patch q. Sunday and Wednesday.  INSTRUCTIONS:  The patient received written instructions regarding medications, activity, diet, wound care and followup.  FOLLOWUP:  Followup will include Dr. [name] on May 21, 2011 at 3:30 with a chest x-ray.  She was instructed follow up with Dr. Gala Romney in 2 weeks.  FINAL DIAGNOSES:  Severe coronary artery disease, severe aortic stenosis.  Other diagnoses include postoperative acute blood loss anemia, postoperative volume overload,  postoperative pleural effusions, history of gastroesophageal reflux, history of anxiety, history of depression, history of previous PTCA as described above, history of extracranial cerebrovascular occlusive disease, history of mild chronic obstructive pulmonary disease, history of hyperlipidemia, history of hypertension, history of migraines, history of osteopenia, history of stress urinary incontinence, history of ovarian cysts.     Rowe Clack, P.A.-C.   ______________________________ Salvatore Decent [name], M.D.    Sherryll Burger  D:  05/06/2011  T:  05/06/2011  Job:  [id number]  cc:   Bevelyn Buckles. Bensimhon, MD Stacie Glaze, MD  Electronically Signed by Gershon Crane P.A.-C. on 05/10/2011 12:32:45 PM Electronically Signed by Charlett Lango M.D. on 05/15/2011 04:15:54 PM

## 2011-05-16 ENCOUNTER — Ambulatory Visit
Admission: RE | Admit: 2011-05-16 | Discharge: 2011-05-16 | Disposition: A | Discharge status: Home / Self Care | Payer: BC Managed Care – PPO | Source: Ambulatory Visit | Attending: Thoracic Surgery (Cardiothoracic Vascular Surgery) | Admitting: Thoracic Surgery (Cardiothoracic Vascular Surgery)

## 2011-05-16 ENCOUNTER — Ambulatory Visit (INDEPENDENT_AMBULATORY_CARE_PROVIDER_SITE_OTHER): Payer: Self-pay | Admitting: Physician Assistant

## 2011-05-16 ENCOUNTER — Other Ambulatory Visit: Payer: Self-pay | Admitting: Thoracic Surgery (Cardiothoracic Vascular Surgery)

## 2011-05-16 VITALS — BP 131/85 | HR 88 | Temp 98.6°F | Resp 16 | Ht 62.5 in | Wt 149.0 lb

## 2011-05-16 DIAGNOSIS — J90 Pleural effusion, not elsewhere classified: Secondary | ICD-10-CM

## 2011-05-16 DIAGNOSIS — I359 Nonrheumatic aortic valve disorder, unspecified: Secondary | ICD-10-CM

## 2011-05-16 DIAGNOSIS — I2510 Atherosclerotic heart disease of native coronary artery without angina pectoris: Secondary | ICD-10-CM

## 2011-05-16 NOTE — Progress Notes (Signed)
PCP is Carrie Mew, MD Referring Provider is Carrie Mew, MD  Chief Complaint  Patient presents with  . Follow-up    cabg/avr 05/01/11 with cxr    HPI: the patient presents today for a 3 week postoperative followup. She is status post coronary artery bypass grafting x1 (LIMA to LAD) and aortic valve replacement with a 21 mm magna Ease Pericardial tissue valve by Dr. [name] on May 01 2011.today she presents with complaints of shortness of breath and increasing left-sided chest discomfort which has been going on for 2-3 days. The pain is mostly in her left back and axillary region and is associated with deep inspiration and cough. She denies any fevers chills or sputum production. She has not been taking anything for pain except Tylenol because she developed a rash from the oxycodone. She completed a ten-day course of Lasix and denies any lower extremity edema at this point. While in the hospital the patient was noted to have a left pleural effusion which at the time of discharge was improving with diuresis. Her appetite has been good and she has been trying to walk regularly. She has not seen her cardiologist but does have an appointment scheduled for September 24.  Past Medical History  Diagnosis Date  . ANXIETY DEPRESSION 02/15/2009  . AORTIC STENOSIS 07/07/2010    a. echo 12/23/10: EF 60-65%, mod to severe AS with mean gradient 29 mmHg  . Barrett's esophagus 04/14/2007  . CAD, NATIVE VESSEL 05/17/2010    a. NSTEMI 8/11 - BMS to RCA;  b. cath 4/12: dLAD 50%, RI 80% (PCI), AVCFX 30%, pRCA 30%, stent ok, 40-50, then 50% dist RCA;  c. s/p Promus DES to RI 12/25/10  . Carotid stenosis 05/17/2010    a. dopplers 11/11: RICA 40-50%; LICA 0-39% (follow up due 11/12)  . 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 Herpes simplex without mention of complication 01/16/2010  . HYPERLIPIDEMIA, MILD 08/29/2009  . HYPERTENSION 04/14/2007  . MIGRAINE HEADACHE 04/20/2008  .  OSTEOPENIA 04/14/2007  . URINARY INCONTINENCE, STRESS, MILD 08/26/2007  . GERD (gastroesophageal reflux disease)   . Menopause   . Ovarian cyst   . Migraine   . Urine, incontinence, stress female   . Adenomatous colon polyp     Past Surgical History  Procedure Date  . Total abdominal hysterectomy   . Tonsillectomy and adenoidectomy   . Nasal sinus surgery     x 2  . Eye lid surgery   . Thrombosed hemorrohid lanced   . Coronary artery bypass graft     Family History  Problem Relation Age of Onset  . Coronary artery disease Other   . Heart disease Other     6 stents  . Heart disease Mother   . COPD Father   . Heart disease Father   . Heart disease Maternal Uncle     Social History History  Substance Use Topics  . Smoking status: Former Games developer  . Smokeless tobacco: Not on file   Comment: quit in 2005  . Alcohol Use: No    Current Outpatient Prescriptions  Medication Sig Dispense Refill  . acetaminophen (TYLENOL) 500 MG chewable tablet Chew 500 mg by mouth every 6 (six) hours as needed.        Marland Kitchen acyclovir (ZOVIRAX) 400 MG tablet TAKE ONE TABLET BY MOUTH THREE TIMES DAILY AS NEEDED FOR COLD SORE  30 tablet  1  . aspirin 81 MG tablet Take 81 mg by mouth daily.        Marland Kitchen  clopidogrel (PLAVIX) 75 MG tablet Take 75 mg by mouth daily.        . CRESTOR 40 MG tablet TAKE ONE TABLET BY MOUTH EVERY DAY  30 each  8  . DEXILANT 60 MG capsule TAKE ONE CAPSULE BY MOUTH EVERY DAY  30 capsule  11  . estradiol (VIVELLE-DOT) 0.05 MG/24HR Place 1 patch onto the skin. Change twice a week       . metoprolol tartrate (LOPRESSOR) 25 MG tablet Take 25 mg by mouth 2 (two) times daily. Only taking once daily       . mometasone-formoterol (DULERA) 100-5 MCG/ACT AERO Inhale 2 puffs into the lungs as directed.        Marland Kitchen PRISTIQ 100 MG 24 hr tablet TAKE ONE TABLET BY MOUTH EVERY DAY.  30 each  5  . tiotropium (SPIRIVA) 18 MCG inhalation capsule Place 18 mcg into inhaler and inhale daily.        .  nadolol (CORGARD) 20 MG tablet Take 20 mg by mouth 2 (two) times daily.          Allergies  Allergen Reactions  . Codeine Sulfate Rash    REACTION: unspecified  . Hydrocodone-Acetaminophen Rash    REACTION: unspecified  . AVW:[id number]+NFAOZHYQM+VHQIONGEXB Acid+Aspartame     REACTION: unspecified    Review of Systems: Denies fevers chills or sputum production. Denies lower extremity edema. For pertinent positives see history of present illness  BP 131/85  Pulse 88  Temp(Src) 98.6 F (37 C) (Oral)  Resp 16  Ht 5' 2.5" (1.588 m)  Wt 149 lb (67.586 kg)  BMI 26.82 kg/m2  SpO2 96% Physical Exam: Sternal incision and chest tube sites have healed well. Heart regular rate and rhythm without murmurs rubs or gallops. Lungs clear on the right with diminished breath sounds in the left base. Extremities no edema. Sternum is stable to palpation.  Diagnostic Tests: Chest x-ray shows an increased left pleural effusion  Impression: Katherine Foley comes in today for a 3 week postoperative followup. She appears to have a worsening left pleural effusion which has not responded to diuresis and is symptomatic with shortness of breath and pleuritic chest pain.   Plan: I scheduled her for an ultrasound-guided thoracentesis in radiology tomorrow. We will have her followup as previously scheduled on Monday, September 17 at our office with a chest x-ray. I've also given her a prescription for Ultram 50 mg one to 2 by mouth every 4 hours when necessary pain.

## 2011-05-16 NOTE — Patient Instructions (Signed)
Pt will undergo ultrasound guided thoracentesis tomorrow in radiology  Pain medicine has been changed to Ultram 50 mg 1-2 every 4 hours as needed   Call if symptoms worsen or don't improve  followup as directed on Monday with an x-ray

## 2011-05-17 ENCOUNTER — Ambulatory Visit (HOSPITAL_COMMUNITY)
Admission: RE | Admit: 2011-05-17 | Discharge: 2011-05-17 | Disposition: A | Discharge status: Home / Self Care | Payer: BC Managed Care – PPO | Source: Ambulatory Visit | Attending: Thoracic Surgery (Cardiothoracic Vascular Surgery) | Admitting: Thoracic Surgery (Cardiothoracic Vascular Surgery)

## 2011-05-17 ENCOUNTER — Other Ambulatory Visit: Payer: Self-pay | Admitting: Thoracic Surgery (Cardiothoracic Vascular Surgery)

## 2011-05-17 DIAGNOSIS — I359 Nonrheumatic aortic valve disorder, unspecified: Secondary | ICD-10-CM

## 2011-05-17 DIAGNOSIS — Z9889 Other specified postprocedural states: Secondary | ICD-10-CM

## 2011-05-17 DIAGNOSIS — J90 Pleural effusion, not elsewhere classified: Secondary | ICD-10-CM | POA: Insufficient documentation

## 2011-05-17 DIAGNOSIS — I2510 Atherosclerotic heart disease of native coronary artery without angina pectoris: Secondary | ICD-10-CM

## 2011-05-21 ENCOUNTER — Encounter: Payer: Self-pay | Admitting: Physician Assistant

## 2011-05-21 ENCOUNTER — Other Ambulatory Visit: Payer: Self-pay | Admitting: Thoracic Surgery (Cardiothoracic Vascular Surgery)

## 2011-05-21 ENCOUNTER — Ambulatory Visit
Admission: RE | Admit: 2011-05-21 | Discharge: 2011-05-21 | Disposition: A | Discharge status: Home / Self Care | Payer: BC Managed Care – PPO | Source: Ambulatory Visit | Attending: Thoracic Surgery (Cardiothoracic Vascular Surgery) | Admitting: Thoracic Surgery (Cardiothoracic Vascular Surgery)

## 2011-05-21 ENCOUNTER — Ambulatory Visit: Payer: BC Managed Care – PPO | Admitting: Internal Medicine

## 2011-05-21 ENCOUNTER — Encounter (HOSPITAL_COMMUNITY): Payer: Self-pay | Admitting: Radiology

## 2011-05-21 ENCOUNTER — Inpatient Hospital Stay (HOSPITAL_COMMUNITY)
Admission: AD | Admit: 2011-05-21 | Discharge: 2011-05-28 | DRG: 076 | Disposition: A | Discharge status: Home Health | Payer: BC Managed Care – PPO | Source: Ambulatory Visit | Attending: Thoracic Surgery (Cardiothoracic Vascular Surgery) | Admitting: Thoracic Surgery (Cardiothoracic Vascular Surgery)

## 2011-05-21 ENCOUNTER — Inpatient Hospital Stay (HOSPITAL_COMMUNITY): Payer: BC Managed Care – PPO

## 2011-05-21 ENCOUNTER — Ambulatory Visit (INDEPENDENT_AMBULATORY_CARE_PROVIDER_SITE_OTHER): Payer: Self-pay | Admitting: Physician Assistant

## 2011-05-21 VITALS — BP 129/79 | HR 88 | Temp 98.5°F | Resp 18 | Ht 62.5 in | Wt 149.0 lb

## 2011-05-21 DIAGNOSIS — J4489 Other specified chronic obstructive pulmonary disease: Secondary | ICD-10-CM | POA: Diagnosis present

## 2011-05-21 DIAGNOSIS — J90 Pleural effusion, not elsewhere classified: Secondary | ICD-10-CM

## 2011-05-21 DIAGNOSIS — F329 Major depressive disorder, single episode, unspecified: Secondary | ICD-10-CM | POA: Diagnosis present

## 2011-05-21 DIAGNOSIS — I2510 Atherosclerotic heart disease of native coronary artery without angina pectoris: Secondary | ICD-10-CM | POA: Diagnosis present

## 2011-05-21 DIAGNOSIS — Z954 Presence of other heart-valve replacement: Secondary | ICD-10-CM

## 2011-05-21 DIAGNOSIS — I359 Nonrheumatic aortic valve disorder, unspecified: Secondary | ICD-10-CM

## 2011-05-21 DIAGNOSIS — Z886 Allergy status to analgesic agent status: Secondary | ICD-10-CM

## 2011-05-21 DIAGNOSIS — F3289 Other specified depressive episodes: Secondary | ICD-10-CM | POA: Diagnosis present

## 2011-05-21 DIAGNOSIS — E785 Hyperlipidemia, unspecified: Secondary | ICD-10-CM | POA: Diagnosis present

## 2011-05-21 DIAGNOSIS — K219 Gastro-esophageal reflux disease without esophagitis: Secondary | ICD-10-CM | POA: Diagnosis present

## 2011-05-21 DIAGNOSIS — J449 Chronic obstructive pulmonary disease, unspecified: Secondary | ICD-10-CM | POA: Diagnosis present

## 2011-05-21 DIAGNOSIS — Z7902 Long term (current) use of antithrombotics/antiplatelets: Secondary | ICD-10-CM

## 2011-05-21 DIAGNOSIS — Z7982 Long term (current) use of aspirin: Secondary | ICD-10-CM

## 2011-05-21 DIAGNOSIS — I10 Essential (primary) hypertension: Secondary | ICD-10-CM | POA: Diagnosis present

## 2011-05-21 DIAGNOSIS — Z951 Presence of aortocoronary bypass graft: Secondary | ICD-10-CM

## 2011-05-21 DIAGNOSIS — I739 Peripheral vascular disease, unspecified: Secondary | ICD-10-CM | POA: Diagnosis present

## 2011-05-21 DIAGNOSIS — D62 Acute posthemorrhagic anemia: Secondary | ICD-10-CM | POA: Diagnosis not present

## 2011-05-21 DIAGNOSIS — Z888 Allergy status to other drugs, medicaments and biological substances status: Secondary | ICD-10-CM

## 2011-05-21 LAB — CBC
Hemoglobin: 8.3 g/dL — ABNORMAL LOW (ref 12.0–15.0)
MCH: 27.4 pg (ref 26.0–34.0)
MCHC: 32.7 g/dL (ref 30.0–36.0)
Platelets: 387 10*3/uL (ref 150–400)
RDW: 14.7 % (ref 11.5–15.5)

## 2011-05-21 LAB — URINE MICROSCOPIC-ADD ON

## 2011-05-21 LAB — BASIC METABOLIC PANEL
Calcium: 8.7 mg/dL (ref 8.4–10.5)
Glucose, Bld: 94 mg/dL (ref 70–99)
Sodium: 124 mEq/L — ABNORMAL LOW (ref 135–145)

## 2011-05-21 LAB — PROTIME-INR: INR: 1.16 (ref 0.00–1.49)

## 2011-05-21 LAB — URINALYSIS, ROUTINE W REFLEX MICROSCOPIC
Hgb urine dipstick: NEGATIVE
Specific Gravity, Urine: 1.018 (ref 1.005–1.030)
Urobilinogen, UA: 4 mg/dL — ABNORMAL HIGH (ref 0.0–1.0)

## 2011-05-21 MED ORDER — IOHEXOL 300 MG/ML  SOLN
80.0000 mL | Freq: Once | INTRAMUSCULAR | Status: AC | PRN
  Administered 2011-05-21: 80 mL via INTRAVENOUS

## 2011-05-21 NOTE — Progress Notes (Signed)
HPI  Ms. Scherer is status post aortic valve replacement using a 21 mm magna Ease Pericardial tissue valve with coronary artery bypass grafting x1 done by Dr. [name] on 05/01/2011.  Her postoperative course was pretty much unremarkable and she was discharged to home on 05/06/2011.  She presented to the office last Thursday for followup visit.  Chest x-ray obtained at that time showed a moderate left pleural effusion.  Patient was symptomatic complaining of left-sided chest pain and shortness of breath.  She was sent over to the hospital where she underwent ultrasound-guided left thoracentesis.  600 cc of bloody effusion was removed at that time.  Patient had an appointment for today 05/21/2011 for followup.  Over the weekend patient did contact the on-call surgeon, Dr. Tyrone Sage, with complaints of fever up to 101.5.  Dr. Tyrone Sage told her to present to the emergency room course she felt stable wait till today for her followup visit.  Patient presents today for her followup visit.  She states that she has not run any more fevers since yesterday.  She states since a left thoracentesis she does still little but better.  She's not having as much left-sided pain or shortness of breath.  She she does state that she is having some coughing spells but nonproductive.  She denies any nausea vomiting, opening or drainage from any of her incision sites.  She does complain of poor appetite.  Patient does have an appointment to see Dr. Gala Romney a week from today.  She has not been ambulating much secondary to her above-mentioned symptoms.   Current Outpatient Prescriptions  Medication Sig Dispense Refill  . acetaminophen (TYLENOL) 500 MG chewable tablet Chew 500 mg by mouth every 6 (six) hours as needed.        Marland Kitchen acyclovir (ZOVIRAX) 400 MG tablet TAKE ONE TABLET BY MOUTH THREE TIMES DAILY AS NEEDED FOR COLD SORE  30 tablet  1  . aspirin 81 MG tablet Take 81 mg by mouth daily.        . clopidogrel (PLAVIX) 75 MG tablet  Take 75 mg by mouth daily.        . CRESTOR 40 MG tablet TAKE ONE TABLET BY MOUTH EVERY DAY  30 each  8  . DEXILANT 60 MG capsule TAKE ONE CAPSULE BY MOUTH EVERY DAY  30 capsule  11  . estradiol (VIVELLE-DOT) 0.05 MG/24HR Place 1 patch onto the skin. Change twice a week       . metoprolol tartrate (LOPRESSOR) 25 MG tablet Take 25 mg by mouth 2 (two) times daily. Only taking once daily       . mometasone-formoterol (DULERA) 100-5 MCG/ACT AERO Inhale 2 puffs into the lungs as directed.        . nadolol (CORGARD) 20 MG tablet Take 20 mg by mouth 2 (two) times daily.        Marland Kitchen PRISTIQ 100 MG 24 hr tablet TAKE ONE TABLET BY MOUTH EVERY DAY.  30 each  5  . tiotropium (SPIRIVA) 18 MCG inhalation capsule Place 18 mcg into inhaler and inhale daily.           Physical Exam  Constitutional: She is oriented to person, place, and time. She appears well-developed and well-nourished.  Cardiovascular: Normal rate, regular rhythm, normal heart sounds and intact distal pulses.        No murmur noted.  Pulmonary/Chest:       Diminished breath sounds noted left base.  Abdominal: Soft. Bowel sounds are normal.  Musculoskeletal: Normal range of motion.  Neurological: She is alert and oriented to person, place, and time. She has normal reflexes.  Skin: Skin is warm and dry.       All incisions are clean dry and intact and healing well.     Diagnostic Tests:  PA and lateral chest x-ray taken today shows recurrent left pleural effusion moderate in size. Impression:   Plan:   Patient is seen status post aortic valve replacements and coronary artery bypass grafting x 1 with postoperative left pleural effusion.  She is status post thoracentesis and on today's visit seen with recurrent left pleural effusion.  Patient was seen and evaluated by Dr. [name].  Due to patient's recurrent left pleural effusion and symptoms he felt that it would be best to readmit patient to the hospital and check a CT scan of the chest  with contrast as well as lab work and blood cultures.  He discussed with patient possible undergoing a repeat thoracentesis versus chest tube placement versus possibly taking her to the operating room for drainage of the left pleural effusion through VATS procedure.  Will await further results of the CT scan of the chest prior to proceeding with any further plans.  Patient acknowledge her understanding and agreed with plan.

## 2011-05-22 ENCOUNTER — Inpatient Hospital Stay (HOSPITAL_COMMUNITY): Payer: BC Managed Care – PPO

## 2011-05-22 DIAGNOSIS — J90 Pleural effusion, not elsewhere classified: Secondary | ICD-10-CM

## 2011-05-22 LAB — BODY FLUID CELL COUNT WITH DIFFERENTIAL
Lymphs, Fluid: 79 %
Monocyte-Macrophage-Serous Fluid: 7 % — ABNORMAL LOW (ref 50–90)
Total Nucleated Cell Count, Fluid: 1718 cu mm — ABNORMAL HIGH (ref 0–1000)

## 2011-05-22 LAB — PROTEIN, BODY FLUID: Total protein, fluid: 4.2 g/dL

## 2011-05-22 LAB — LACTATE DEHYDROGENASE, PLEURAL OR PERITONEAL FLUID: LD, Fluid: 356 U/L — ABNORMAL HIGH (ref 3–23)

## 2011-05-22 LAB — SURGICAL PCR SCREEN: MRSA, PCR: NEGATIVE

## 2011-05-23 ENCOUNTER — Inpatient Hospital Stay (HOSPITAL_COMMUNITY): Payer: BC Managed Care – PPO

## 2011-05-23 LAB — PATHOLOGIST SMEAR REVIEW: Tech Review: REACTIVE

## 2011-05-24 ENCOUNTER — Inpatient Hospital Stay (HOSPITAL_COMMUNITY): Payer: BC Managed Care – PPO

## 2011-05-24 LAB — CBC
HCT: 23.3 % — ABNORMAL LOW (ref 36.0–46.0)
Hemoglobin: 7.4 g/dL — ABNORMAL LOW (ref 12.0–15.0)
WBC: 7.7 10*3/uL (ref 4.0–10.5)

## 2011-05-24 LAB — BASIC METABOLIC PANEL
BUN: 10 mg/dL (ref 6–23)
Chloride: 97 mEq/L (ref 96–112)
GFR calc Af Amer: >60 mL/min (ref >60)
Glucose, Bld: 106 mg/dL — ABNORMAL HIGH (ref 70–99)
Potassium: 4.5 mEq/L (ref 3.5–5.1)

## 2011-05-24 LAB — BLOOD GAS, ARTERIAL
Bicarbonate: 24.7 mEq/L — ABNORMAL HIGH (ref 20.0–24.0)
pCO2 arterial: 44.9 mmHg (ref 35.0–45.0)
pH, Arterial: 7.361 (ref 7.350–7.400)
pO2, Arterial: 82.7 mmHg (ref 80.0–100.0)

## 2011-05-24 NOTE — Op Note (Signed)
Katherine Foley, Katherine Foley                 ACCOUNT NO.:  [id number]  MEDICAL RECORD NO.:  [id number]  LOCATION:  3306                         FACILITY:  [facility]  PHYSICIAN:  Salvatore Decent. [name], M.D. DATE OF BIRTH:  1950/10/31  DATE OF PROCEDURE:  05/23/2011 DATE OF DISCHARGE:                              OPERATIVE REPORT   PREOPERATIVE DIAGNOSIS:  Loculated left hydropneumothorax.  POSTOPERATIVE DIAGNOSIS:  Loculated left hydropneumothorax.  PROCEDURE:  Left video-assisted thoracoscopy for drainage of loculated of left hydropneumothorax.  SURGEON:  Salvatore Decent. [name], MD  ASSISTANT:  Doree Fudge, PA  ANESTHESIA:  Bedelia Person, MD  BRIEF CLINICAL NOTE:  The patient is a 60 year old female who underwent aortic valve replacement and single-vessel coronary artery bypass grafting by Dr. Charlett Lango 3 weeks previously.  The patient's initial postoperative recovery was uncomplicated and she was discharged home.  She returned with left-sided chest pain and shortness of breath and was found to have a large left pleural effusion.  Left needle thoracentesis was performed in Interventional Radiology, but only a small amount of fluid was evacuated.  The patient returned with worsening symptoms of shortness of breath and enlarging left effusion. A second attempt at thoracentesis was performed yielding almost 1.5 liters of serosanguineous fluid.  However, the lung did not completely reexpand and the patient was left with a loculated left hydropneumothorax.  The patient is now brought to the operating room for surgical management.  The patient and her family have been fully informed regarding the indications, risks, and potential benefits of surgery.  Alternative treatment strategies have been discussed.  The patient provides full informed consent for the surgery as described.  OPERATIVE FINDINGS:  Evidence for subacute hemothorax on the left side.  PROCEDURE NOTE IN DETAIL:  The patient  was brought to the operating room on the above-mentioned date and placed in the supine position on the operating table.  Central venous catheter and radial arterial line were placed for access and monitoring purposes under the care and direction of Dr. Bedelia Person.  Intravenous antibiotics were administered.  General endotracheal anesthesia was induced uneventfully.  The patient was intubated with a dual-lumen endotracheal tube.  A Foley catheter was placed.  The patient was turned to the right lateral decubitus position. The left chest was prepared and draped in a sterile manner.  A small incision was made overlying the sixth intercostal space anteriorly.  The incision was completed through the subcutaneous tissues with electrocautery.  The left pleural space was entered bluntly.  A 10- mm port was passed through the incision and a 32-degree endoscopic video camera was passed through the port.  The left chest was explored visually.  There was evidence for subacute hemothorax with old blood and clot in the chest as well as serosanguineous fluid.  A second miniature incision was placed more straight laterally through the sixth intercostal space.  Using this small incision as a working port and under endoscopic guidance, the clot and fibrinous debris in the left chest was all evacuated using the combination of suction and blunt dissection.  The surface of the left upper lobe looked normal.  The surface of the left lower lobe looked  hyperemic and inflamed, but there did not appear to be any significant fibrosis or peel formation on the visceral pleura.  The left chest was  irrigated with copious warm saline solution.  Once the lung had been fully mobilized, the left chest was drained using a combination of a 36-French chest tube and a 32-French Bard drain.  The miniature thoracotomy incision was closed in layers and skin incision was closed with subcuticular skin closure.  Chest tubes were  fixed to closed suction drainage device.  The patient tolerated the procedure well.  The patient was extubated in the operating room and transported to the recovery room in stable condition.  There were no intraoperative complications.  All sponge, instrument, and needle counts were verified as correct at completion of the operation.  Estimated blood loss was less than 50 mL.     Salvatore Decent. [name], M.D.     CHO/MEDQ  D:  05/23/2011  T:  05/23/2011  Job:  [id number]  Electronically Signed by Tressie Stalker M.D. on 05/24/2011 07:49:32 AM

## 2011-05-25 ENCOUNTER — Inpatient Hospital Stay (HOSPITAL_COMMUNITY): Payer: BC Managed Care – PPO

## 2011-05-25 LAB — BASIC METABOLIC PANEL
BUN: 9 mg/dL (ref 6–23)
CO2: 28 mEq/L (ref 19–32)
GFR calc non Af Amer: >60 mL/min (ref >60)
Glucose, Bld: 97 mg/dL (ref 70–99)
Potassium: 4.3 mEq/L (ref 3.5–5.1)

## 2011-05-25 LAB — CBC
HCT: 20.6 % — ABNORMAL LOW (ref 36.0–46.0)
Hemoglobin: 6.8 g/dL — CL (ref 12.0–15.0)
MCH: 27.8 pg (ref 26.0–34.0)
MCHC: 33 g/dL (ref 30.0–36.0)
RBC: 2.45 MIL/uL — ABNORMAL LOW (ref 3.87–5.11)

## 2011-05-25 NOTE — H&P (Signed)
Katherine Foley, Katherine Foley                 ACCOUNT NO.:  [id number]  MEDICAL RECORD NO.:  [id number]  LOCATION:  2012                         FACILITY:  [facility]  PHYSICIAN:  Salvatore Decent. [name], M.D.DATE OF BIRTH:  1951-04-05  DATE OF ADMISSION:  05/21/2011 DATE OF DISCHARGE:                             HISTORY & PHYSICAL   CHIEF COMPLAINT:  Shortness of breath and left-sided chest pain.  HISTORY OF PRESENT ILLNESS:  The patient is a 60 year old female who recently underwent coronary artery bypass grafting x1 using a left internal mammary artery to left anterior descending artery as well as aortic valve replacement a 21-mm Magna Ease pericardial tissue valve done by Dr. [name] on May 01, 2011.  The patient's postoperative course is pretty much unremarkable.  She was ultimately discharged to home on May 06, 2011.  The patient presented to the office last week early thinking for follow up visit was last Thursday.  She was seen by Coral Ceo, PA-C and at that time noted to have a moderate-sized left pleural effusion.  The patient was sent over to the hospital where she underwent ultrasound-guided left thoracentesis.  A 600 mL of bloody fluid was removed at this time.  The patient tolerated well.  The patient had an appointment for today May 21, 2011 and was told to keep it.  Over the weekend, the patient developed fever up to 101.5 and contacted Dr. Tyrone Sage.  Dr. Tyrone Sage told the patient she could present to the emergency room for further evaluation or wait to be seen at the office on May 21, 2011.  The patient opted to wait to be seen at the office.  She presents today on May 21, 2011, for further follow up.  The patient states as above that she did develop a temperature of 101.5 over the weekend both Saturday and Sunday.  She states today she has been afebrile.  She has had spells of coughing, which have been nonproductive.  She denies any nausea,  vomiting, drainage, or opening from any of her incision sites.  She states since the left thoracentesis has been done, she has felt slightly better.  She still has some mild left-sided chest pain, but not as bad as well as some mild shortness of breath, but not as bad.  She does complain of poor appetite.  She is not ambulating much at all.  She does have an appointment to see Dr. Gala Romney next Monday.  PAST MEDICAL HISTORY: 1. Depression. 2. Gastroesophageal reflux disease. 3. History of Barrett esophagus. 4. History of extracranial cerebrovascular arterial occlusive disease     with 40-50% right ICA stenosis and 0-39% left ICA stenosis. 5. Mild chronic obstructive pulmonary disease. 6. History of hyperlipidemia. 7. Hypertension. 8. History of migraines. 9. History of osteopenia. 10.History of stress urinary incontinence. 11.History of ovarian cyst. 12.Herpes simplex without mention of complication.  PAST SURGICAL HISTORY: 1. Coronary artery bypass grafting x1 using a left internal mammary     artery to left anterior descending coronary artery with aortic     valve replacement using 21-mm Magna Ease pericardial tissue valve     done by Dr. [name] on May 01, 2011. 2. Status  post total abdominal hysterectomy. 3. Status post tonsillectomy and adenoidectomy. 4. Status post nasal surgery sinus x2. 5. Status post eyelid surgery. 6. Thrombosed hemorrhoid lymphs.  FAMILY HISTORY:  Noncontributory.  MEDICATIONS: 1. Tylenol 500 mg every 6 hours as needed. 2. Acyclovir 400 mg t.i.d. p.r.n. cold sores. 3. Aspirin 81 mg daily. 4. Plavix 75 mg daily. 5. Crestor 40 mg daily. 6. Dexilant 60 mg daily. 7. Estradiol 0.05 mg/24 hours, placed on patch and skin change twice a     week. 8. Lopressor 25 mg b.i.d. 9. Dulera 100 - 5 mcg/HCT. 10.Aerosol inhale two puffs b.i.d. 11.Cozaar 20 mg b.i.d. 12.Pristiq 100 mg daily. 13.Spiriva 18 mcg inhale daily.  ALLERGIES:  The patient  states allergic to CODEINE, HYDROCODONE, and AMOXICILLIN.  SOCIAL HISTORY:  The patient has a history of tobacco use which she quit in 2005.  She denies any excessive alcohol use.  REVIEW OF SYSTEMS:  See HPI for pertinent positives and negatives.  The patient denies any recent changes in weight.  She does complain of fevers, chills and sweats.  She denies any recent headaches, changes in vision, difficulty hearing.  She denies any palpitations, lightheadedness, dizziness, orthopnea, paroxysmal nocturnal dyspnea. Denies any nausea, vomiting, abdominal pain, changes in bowel movements, diarrhea, constipation, melena, hematemesis, and hematochezia.  Denies any urgency, frequency, dysuria, or hematuria.  Denies any muscle aches or pains or claudication symptoms.  She denies any TIA or CVA symptoms such as amaurosis fugax or slurred speech.  PHYSICAL EXAMINATION:  VITAL SIGNS:  Blood pressure 129/79, pulse of 88, O2 sats 93% on room air, temperature of 98.5, respirations of 18. GENERAL:  The patient is well-developed and well-nourished white female in no acute distress. HEENT:  Normocephalic, atraumatic.  Pupils are equal, round, reactive to light and accommodation.  Extraocular movements intact.  Oral mucosa is pink and moist. NECK:  Supple.  No carotid bruits noted. RESPIRATORY:  Diminished breath sounds at left base. CARDIAC:  Regular rate and rhythm with no murmurs, gallops, or rubs noted. ABDOMEN:  Bowel sounds x4.  Soft, nontender. GU:  Deferred. RECTAL:  Deferred. EXTREMITIES:  No edema noted.  No cyanosis or clubbing noted.  The patient has 2+ bilateral radial, DP and PT pulses noted. NEUROLOGIC:  Symmetrical.  The patient is alert and or oriented x4.  5/5 bilateral muscle strengths.  STUDIES:  The patient had PA and lateral chest x-ray obtained today showing a recurrent left pleural effusion slightly increased in size.  IMPRESSION AND PLAN:  The patient is status post  coronary artery bypass grafting with aortic valve replacement using a 21-mm pericardial tissue valve done by Dr. [name] on May 01, 2011.  The patient was seen and evaluated by Dr. [name] today.  The patient was seen with recurrent left pleural effusion.  Due to the patient's recurrent left pleural effusion and symptoms as stated above, it was felt best to readmit the patient to the hospital where we can obtain CT scan of the chest with contrast as well as routine lab work including blood cultures.  Based on the CT scan, the patient may require repeat ultrasound-guided thoracentesis versus chest tube placement versus taking the patient to the operating room to undergo video-assisted thoracoscopic surgery procedure for drainage of this effusion.  This will be decided once results of CT scan obtained.  This was all discussed with the patient. The patient acknowledged understanding and agreed with plan.     Sol Blazing, PA   ______________________________ Salvatore Decent  [name], M.D.    KMD/MEDQ  D:  05/21/2011  T:  05/22/2011  Job:  [id number]  cc:   Bevelyn Buckles. Bensimhon, MD  Electronically Signed by Cameron Proud PA on 05/23/2011 01:40:45 PM Electronically Signed by Charlett Lango M.D. on 05/25/2011 01:41:50 PM

## 2011-05-26 ENCOUNTER — Inpatient Hospital Stay (HOSPITAL_COMMUNITY): Payer: BC Managed Care – PPO

## 2011-05-26 LAB — CBC
Hemoglobin: 7.9 g/dL — ABNORMAL LOW (ref 12.0–15.0)
MCH: 27 pg (ref 26.0–34.0)
MCHC: 32.5 g/dL (ref 30.0–36.0)
MCV: 82.9 fL (ref 78.0–100.0)

## 2011-05-26 LAB — BODY FLUID CULTURE: Special Requests: 3

## 2011-05-26 LAB — BASIC METABOLIC PANEL
BUN: 8 mg/dL (ref 6–23)
Calcium: 8.1 mg/dL — ABNORMAL LOW (ref 8.4–10.5)
Creatinine, Ser: 0.5 mg/dL (ref 0.50–1.10)
GFR calc non Af Amer: >60 mL/min (ref >60)
Glucose, Bld: 94 mg/dL (ref 70–99)
Sodium: 127 mEq/L — ABNORMAL LOW (ref 135–145)

## 2011-05-27 ENCOUNTER — Inpatient Hospital Stay (HOSPITAL_COMMUNITY): Payer: BC Managed Care – PPO

## 2011-05-27 LAB — CROSSMATCH
ABO/RH(D): AB POS
Antibody Screen: NEGATIVE
Unit division: 0
Unit division: 0

## 2011-05-27 LAB — CBC
MCH: 30.5 pg (ref 26.0–34.0)
MCHC: 34.4 g/dL (ref 30.0–36.0)
MCV: 88.6 fL (ref 78.0–100.0)
Platelets: 238 10*3/uL (ref 150–400)
RBC: 4.66 MIL/uL (ref 3.87–5.11)
RDW: 14.1 % (ref 11.5–15.5)

## 2011-05-27 LAB — BASIC METABOLIC PANEL
Calcium: 9.1 mg/dL (ref 8.4–10.5)
Creatinine, Ser: 0.7 mg/dL (ref 0.50–1.10)
GFR calc Af Amer: >60 mL/min (ref >60)
GFR calc non Af Amer: >60 mL/min (ref >60)
Sodium: 132 mEq/L — ABNORMAL LOW (ref 135–145)

## 2011-05-28 ENCOUNTER — Encounter (HOSPITAL_COMMUNITY): Payer: BC Managed Care – PPO

## 2011-05-28 ENCOUNTER — Inpatient Hospital Stay (HOSPITAL_COMMUNITY): Payer: BC Managed Care – PPO

## 2011-05-28 LAB — CULTURE, BLOOD (ROUTINE X 2)
Culture  Setup Time: 201209180138
Culture: NO GROWTH

## 2011-05-28 LAB — CBC
Platelets: 299 10*3/uL (ref 150–400)
RBC: 3.06 MIL/uL — ABNORMAL LOW (ref 3.87–5.11)
RDW: 15 % (ref 11.5–15.5)
WBC: 5.7 10*3/uL (ref 4.0–10.5)

## 2011-05-28 NOTE — Progress Notes (Signed)
H & P dictated

## 2011-05-30 NOTE — Discharge Summary (Signed)
Katherine Foley, Katherine Foley                 ACCOUNT NO.:  [id number]  MEDICAL RECORD NO.:  [id number]  LOCATION:  3306                         FACILITY:  [facility]  PHYSICIAN:  Salvatore Decent. [name], M.D. DATE OF BIRTH:  [birth year]  DATE OF ADMISSION:  05/21/2011 DATE OF DISCHARGE:  05/28/2011                              DISCHARGE SUMMARY   FINAL DIAGNOSIS:  Loculated left hydropneumothorax.  SECONDARY DIAGNOSES: 1. History of coronary artery disease and aortic valve stenosis status     post coronary artery bypass grafting x1 with a left internal     mammary artery to left anterior descending coronary artery as well     as aortic valve replacement, 21-mm Magna Ease pericardial tissue     valve, done by Dr. [name], May 01, 2011. 2. History of depression. 3. History of gastroesophageal reflux disease. 4. History of Barrett esophagus. 5. History of extracranial cerebrovascular arterial occlusive disease     with 40% to 50% right internal carotid artery stenosis and 0-39%     left internal carotid artery stenosis. 6. History of mild chronic obstructive pulmonary disease. 7. History of hyperlipidemia. 8. History of hypertension. 9. History of migraines. 10.History of osteopenia. 11.History of stress urinary incontinence. 12.History of ovarian cyst. 13.Herpes simplex without mention of complication. 14.Status post total abdominal hysterectomy. 15.Status post tonsillectomy and adenoidectomy. 16.Status post nasal surgery sinus x2. 17.Status post eyelid surgery. 18.Status post thrombosed hemorrhoid surgery.  IN-HOSPITAL OPERATIONS AND PROCEDURES:  Left video-assisted thoracoscopic surgery for drainage of loculated left hydropneumothorax.  HISTORY AND PHYSICAL AND HOSPITAL COURSE:  The patient is a 60 year old female who underwear aortic valve replacement, single-vessel coronary artery bypass grafting by Dr. [name] on May 01, 2011.  The patient's initial postoperative recovery was  uncomplicated, and she was discharged home.  She returned with left-sided chest pain and shortness of breath and was found to have a large left pleural effusion.  Left needle thoracentesis was performed by Interventional Radiology, only small amount of fluid was evacuated.  The patient returned with worsening symptoms of shortness of breath and enlarging left pleural effusion.  At that time, it was felt that the patient will require admission to [facility].  For further details of the patient's past medical history and physical exam, please see dictated H and P.  The patient was admitted to [facility] on May 21, 2011, with recurrent left pleural effusion.  CT scan of chest was obtained on admission showing a large left pleural effusion with associated consolidation, likely compressive atelectasis.  Dr. [name] discussed with the patient performing a left thoracentesis.  He discussed risks and benefits with the patient.  The patient acknowledged understanding and agreed to proceed.  Left thoracentesis was done on May 22, 2011, at the bedside by Dr. [name].  He was able to move 1415 mL thin serosanguineous fluid.  The patient tolerated the procedure well and felt better following procedure.  Chest x-ray obtained, however, did reveal a left-sided hydropneumothorax.  Dr. [name] felt that this was secondary to incomplete evacuation of the left lung loculation on that side.  He felt that it would be best to take the patient to the operating  room for further drainage and chest tube placement to assist in re-expansion of the left lung.  He discussed risks and benefits with the patient.  The patient acknowledged understanding and agreed to proceed.  This was scheduled for May 23, 2011.  The patient was taken to the operating room on May 23, 2011, where she underwent left video-assisted thoracoscopic surgery for drainage of loculated left hydropneumothorax.  The  patient tolerated this procedure well and was transferred to the intensive care unit in stable condition. She was able to be extubated following surgery.  Postextubation, the patient was noted to be alert and oriented x4 and neuro intact.  She was noted to be hemodynamically stable postoperatively.  During the patient's postoperative course, daily chest x-rays were obtained.  There was noted to be persistent moderate left pleural effusion with adjacent atelectasis/consolidation at the base.  This remained consistent in her chest x-rays.  The patient's drainage from her chest tubes was minimal. She noted to have no air leak.  One chest tube was discontinued on May 25, 2011, with remaining chest tube discontinued on May 26, 2011.  During this time, the patient has been encouraged to use her incentive spirometer.  She currently remains on 2 liters of nasal cannula.  We will ambulate the patient off O2 to see if she desats and if the patient states will require home O2.  During this time, the patient did develop acute blood loss anemia.  Hemoglobin and hematocrit dropped to 6.8 and 20.6.  One unit of packed red blood cells was transfused.  Hemoglobin and hematocrit improved appropriately following day to 7.9 and 24.3.  On today, hemoglobin and hematocrit were 14.2 and 41.3.  We will recheck in a.m. to verify.  The patient has been up ambulating well.  She is tolerating diet well.  No nausea, vomiting noted.  All incisions are clean, dry, and intact and healing well.  On May 27, 2011, the patient was noted to be afebrile, in normal sinus rhythm.  Blood pressure is stable.  O2 sat is greater than 90% on 2 liters oxygen.  Lab work shows sodium of 132, potassium of 3.9, chloride of 94, bicarbonate of 30, BUN of 13, creatinine of 0.7, glucose of 114.  White blood cell count 9.7, hemoglobin 14.2, hematocrit 41.3, platelet count 238.  The patient is tentatively ready for discharge  to home in the a.m. on May 28, 2011, postop day 5.  FOLLOWUP APPOINTMENTS:  A followup appointment has been arranged with Dr. [name] for June 05, 2011, at 3:30 p.m.  The patient will need to obtain PA and lateral chest x-ray 1 hour prior to this appointment. Suture removal appointment has been arranged with the nurse for June 01, 2011, at 10 o'clock a.m.  ACTIVITY:  The patient is instructed no driving until released to do so, no lifting over 10 pounds.  She is told to ambulate 3-4 times per day, progress as tolerated and continue her breathing exercises.  INCISIONAL CARE:  The patient is told to shower washing her incisions using soap and water.  She is to contact the office if she develops any drainage or opening from any of her incision sites.  DIET:  The patient was educated on diet to be low fat, low salt.  DISCHARGE MEDICATIONS: 1. Ferrous sulfate 325 mg b.i.d. 2. Folic acid 1 mg daily. 3. Corgard 20 mg b.i.d. 4. Ultram 50 mg 1-2 tablets q.6 h. p.r.n. pain. 5. Acyclovir 400 mg t.i.d. p.r.n. 6.  Enteric-coated aspirin 81 mg daily. 7. Crestor 40 mg daily. 8. Dexilant 60 mg 1 tablet daily. 9. Dulera two puffs b.i.d. 10.Estradiol 0.5 mg for 24 hours transdermally, change weekly. 11.Plavix 75 mg daily. 12.Pristiq XR 100 mg daily. 13.Spiriva 18 mcg daily.     Sol Blazing, PA   ______________________________ Salvatore Decent. [name], M.D.    KMD/MEDQ  D:  05/27/2011  T:  05/27/2011  Job:  [id number]  cc:   Salvatore Decent. [name], M.D.  Electronically Signed by Cameron Proud PA on 05/29/2011 11:25:14 AM Electronically Signed by Tressie Stalker M.D. on 05/30/2011 10:14:45 AM

## 2011-05-31 ENCOUNTER — Other Ambulatory Visit: Payer: Self-pay | Admitting: Thoracic Surgery (Cardiothoracic Vascular Surgery)

## 2011-05-31 DIAGNOSIS — I2510 Atherosclerotic heart disease of native coronary artery without angina pectoris: Secondary | ICD-10-CM

## 2011-06-01 ENCOUNTER — Ambulatory Visit (INDEPENDENT_AMBULATORY_CARE_PROVIDER_SITE_OTHER): Payer: Self-pay

## 2011-06-01 DIAGNOSIS — I359 Nonrheumatic aortic valve disorder, unspecified: Secondary | ICD-10-CM

## 2011-06-01 DIAGNOSIS — R600 Localized edema: Secondary | ICD-10-CM

## 2011-06-01 DIAGNOSIS — J90 Pleural effusion, not elsewhere classified: Secondary | ICD-10-CM

## 2011-06-01 MED ORDER — FUROSEMIDE 40 MG PO TABS: 40.0000 mg | ORAL_TABLET | Freq: Every day | ORAL | Status: AC

## 2011-06-01 MED ORDER — POTASSIUM CHLORIDE CRYS ER 20 MEQ PO TBCR: 20.0000 meq | EXTENDED_RELEASE_TABLET | Freq: Every day | ORAL | Status: AC

## 2011-06-01 NOTE — Progress Notes (Signed)
This  60 year old female patient here for sutures removal left rib area from previous chest tube placement. Chest tubes discontinued Sept.22,2012 while in hospital. Two sutures removed without any difficulty. Incision site clean and dry, healing well. No drainage or redness noted.  Patient noted 3+ edema to feet and ankles bilateral. No noted shortness of breath. Pulse ox 97% on room air. Patient request fluid pill, was previous on Lasix and K+.  Patient to see Dr. [name] on Oct.2. Will call order for lasix 40mg  and K+ to pharmacy per Dawayne Patricia.   Patient here with daughter, both voiced understanding. Will contact office if drainage or opening from any of her incision sites.  FTSerrano,Lpn

## 2011-06-05 ENCOUNTER — Encounter: Payer: Self-pay | Admitting: Thoracic Surgery (Cardiothoracic Vascular Surgery)

## 2011-06-05 ENCOUNTER — Ambulatory Visit (INDEPENDENT_AMBULATORY_CARE_PROVIDER_SITE_OTHER): Payer: Self-pay | Admitting: Thoracic Surgery (Cardiothoracic Vascular Surgery)

## 2011-06-05 ENCOUNTER — Ambulatory Visit
Admission: RE | Admit: 2011-06-05 | Discharge: 2011-06-05 | Disposition: A | Discharge status: Home / Self Care | Payer: BC Managed Care – PPO | Source: Ambulatory Visit | Attending: Thoracic Surgery (Cardiothoracic Vascular Surgery) | Admitting: Thoracic Surgery (Cardiothoracic Vascular Surgery)

## 2011-06-05 VITALS — BP 161/89 | HR 67 | Resp 16 | Ht 62.5 in | Wt 139.5 lb

## 2011-06-05 DIAGNOSIS — J90 Pleural effusion, not elsewhere classified: Secondary | ICD-10-CM

## 2011-06-05 DIAGNOSIS — I359 Nonrheumatic aortic valve disorder, unspecified: Secondary | ICD-10-CM

## 2011-06-05 DIAGNOSIS — I2510 Atherosclerotic heart disease of native coronary artery without angina pectoris: Secondary | ICD-10-CM

## 2011-06-05 DIAGNOSIS — J948 Other specified pleural conditions: Secondary | ICD-10-CM

## 2011-06-05 NOTE — Progress Notes (Signed)
PCP is Carrie Mew, MD Referring Provider is Carrie Mew, MD  Chief Complaint  Patient presents with  . Follow-up    cabgx1,avr(tissue valve( 05/01/11   (L)VATS,drainage of loculated LEFT hydropneumothorax 05/23/11 (CHO) with cxr    HPI: Katherine Foley is a 60 year old woman who underwent aortic valve replacement with a 21 mm Edwards magna ease pericardial valve and coronary artery bypass grafting x1 with a left internal mammary artery to her LAD on August 28. Her initial postoperative course was uncomplicated and she was discharged home on September 2. She was seen back in the office on September 17 by Dr. [name] as I was in [facility] at the time. She was noted to have a large left pleural effusion. An ultrasound-guided thoracentesis was attempted but resulted in minimal fluid being drained. She was admitted to the hospital and a CT of the chest was done which showed a large left pleural effusion with associated compressive atelectasis. Dr. [name] did a left vas and drain the loculated hydrothorax on September 19 she was discharged on September 24 without significant complication.  She states that since that time she's had some incisional discomfort associated with her thoracoscopy incisions she has some mild discomfort associated with a sternotomy incision which is not requiring narcotics. She does have mild orthopnea and also has a dry cough when she lies flat on her back. She does says that her breathing is markedly improved since her thoracoscopy. She's not had any anginal-type chest pain  Past Medical History  Diagnosis Date  . ANXIETY DEPRESSION 02/15/2009  . AORTIC STENOSIS 07/07/2010    a. echo 12/23/10: EF 60-65%, mod to severe AS with mean gradient 29 mmHg  . Barrett's esophagus 04/14/2007  . CAD, NATIVE VESSEL 05/17/2010    a. NSTEMI 8/11 - BMS to RCA;  b. cath 4/12: dLAD 50%, RI 80% (PCI), AVCFX 30%, pRCA 30%, stent ok, 40-50, then 50% dist RCA;  c. s/p Promus DES to RI 12/25/10    . Carotid stenosis 05/17/2010    a. dopplers 11/11: RICA 40-50%; LICA 0-39% (follow up due 11/12)  . 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 Herpes simplex without mention of complication 01/16/2010  . HYPERLIPIDEMIA, MILD 08/29/2009  . HYPERTENSION 04/14/2007  . MIGRAINE HEADACHE 04/20/2008  . OSTEOPENIA 04/14/2007  . URINARY INCONTINENCE, STRESS, MILD 08/26/2007  . GERD (gastroesophageal reflux disease)   . Menopause   . Ovarian cyst   . Migraine   . Urine, incontinence, stress female   . Adenomatous colon polyp     Past Surgical History  Procedure Date  . Total abdominal hysterectomy   . Tonsillectomy and adenoidectomy   . Nasal sinus surgery     x 2  . Eye lid surgery   . Thrombosed hemorrohid lanced   . Coronary artery bypass graft     Family History  Problem Relation Age of Onset  . Coronary artery disease Other   . Heart disease Other     6 stents  . Heart disease Mother   . COPD Father   . Heart disease Father   . Heart disease Maternal Uncle     Social History History  Substance Use Topics  . Smoking status: Former Games developer  . Smokeless tobacco: Not on Foley   Comment: quit in 2005  . Alcohol Use: No    Current Outpatient Prescriptions  Medication Sig Dispense Refill  . acetaminophen (TYLENOL) 500 MG chewable tablet Chew 500 mg by mouth every 6 (six)  hours as needed.        Marland Kitchen acyclovir (ZOVIRAX) 400 MG tablet TAKE ONE TABLET BY MOUTH THREE TIMES DAILY AS NEEDED FOR COLD SORE  30 tablet  1  . aspirin 81 MG tablet Take 81 mg by mouth daily.        . clopidogrel (PLAVIX) 75 MG tablet Take 75 mg by mouth daily.        . CRESTOR 40 MG tablet TAKE ONE TABLET BY MOUTH EVERY DAY  30 each  8  . DEXILANT 60 MG capsule TAKE ONE CAPSULE BY MOUTH EVERY DAY  30 capsule  11  . estradiol (VIVELLE-DOT) 0.05 MG/24HR Place 1 patch onto the skin. Change twice a week       . furosemide (LASIX) 40 MG tablet Take 1 tablet (40 mg total) by mouth daily.  5 tablet   0  . metoprolol tartrate (LOPRESSOR) 25 MG tablet Take 25 mg by mouth 2 (two) times daily. Only taking once daily       . mometasone-formoterol (DULERA) 100-5 MCG/ACT AERO Inhale 2 puffs into the lungs as directed.        . nadolol (CORGARD) 20 MG tablet Take 20 mg by mouth 2 (two) times daily.        . potassium chloride SA (K-DUR,KLOR-CON) 20 MEQ tablet Take 1 tablet (20 mEq total) by mouth daily.  5 tablet  0  . PRISTIQ 100 MG 24 hr tablet TAKE ONE TABLET BY MOUTH EVERY DAY.  30 each  5  . tiotropium (SPIRIVA) 18 MCG inhalation capsule Place 18 mcg into inhaler and inhale daily.          Allergies  Allergen Reactions  . Codeine Sulfate Rash    REACTION: unspecified  . Hydrocodone-Acetaminophen Rash    REACTION: unspecified  . ZOX:[id number]+[id number]+[id number] Acid+Aspartame     REACTION: unspecified    Review of Systems: See hpi  BP 161/89  Pulse 67  Resp 16  Ht 5' 2.5" (1.588 m)  Wt 139 lb 8 oz (63.277 kg)  BMI 25.11 kg/m2  SpO2 96% Physical Exam: On exam the Kenn Foley is a 60 year old woman in no acute distress she is well-developed well-nourished. Neurologic exam is intact. Lungs have slightly diminished breath sounds at the left base. Cardiac exam is a regular rate and rhythm normal S1 and S2. Her sternal incision is clean dry and intact. She does have some mild tenderness to palpation of the sternum, the sternum is stable. Her VATS incisions are intact without signs of infection. She is trace peripheral edema  Diagnostic Tests: Chest x-ray today shows a moderate loculated left pleural effusion.  Impression: [name] is a 60 year old woman status post aortic valve replacement and coronary bypass grafting complicated by a left pleural effusion which required a VATS for drainage. The cardiac standpoint she is doing very well at this point in time. She does continue to have some residual loculated effusion on the left side. I think she would benefit from a steroid taper. She  has been on Lasix and potassium since discharge from the hospital but her prescription runs out today. I think she would benefit from some continue diuresis as well. She has noted some difficulty with sleeping and is requesting a medication for that as well.  Plan: I gave her a prescription for a prednisone taper tapering from 50 mg to 10 mg for 5 days and then to be stopped.  Prescription given for Lasix 40 mg daily potassium 20 mEq daily  for 14 days  I will see her back in 3 weeks with a repeat chest x-ray to check on her progress

## 2011-06-09 ENCOUNTER — Other Ambulatory Visit: Payer: Self-pay | Admitting: Internal Medicine

## 2011-06-12 ENCOUNTER — Encounter: Payer: Self-pay | Admitting: Cardiovascular Disease

## 2011-06-12 ENCOUNTER — Ambulatory Visit (INDEPENDENT_AMBULATORY_CARE_PROVIDER_SITE_OTHER): Payer: BC Managed Care – PPO | Admitting: Cardiovascular Disease

## 2011-06-12 DIAGNOSIS — I2510 Atherosclerotic heart disease of native coronary artery without angina pectoris: Secondary | ICD-10-CM

## 2011-06-12 DIAGNOSIS — I359 Nonrheumatic aortic valve disorder, unspecified: Secondary | ICD-10-CM

## 2011-06-12 DIAGNOSIS — I10 Essential (primary) hypertension: Secondary | ICD-10-CM

## 2011-06-12 MED ORDER — LOSARTAN POTASSIUM 50 MG PO TABS: 50.0000 mg | ORAL_TABLET | Freq: Every day | ORAL | Status: DC

## 2011-06-12 NOTE — Patient Instructions (Signed)
Your physician wants you to follow-up in: 3 MONTHS.  You will receive a reminder letter in the mail two months in advance. If you don't receive a letter, please call our office to schedule the follow-up appointment.  Your physician has recommended you make the following change in your medication: START Losartan 50mg  take one by mouth daily

## 2011-06-17 NOTE — Progress Notes (Signed)
HPI:  Katherine Foley is a 60 year old woman presented for followup evaluation. She presented with non-ST elevation infarction in 2011 and was treated with bare-metal stenting of the right coronary artery. She underwent intervention of a high-grade stenosis of the left circumflex in April 2012. She was noted to have moderate aortic stenosis and this was considered in the past but it was not felt to be severe enough for surgical aVR. However, the patient continued to have chest pain following her PCI procedures. Her symptoms were typical of angina. She underwent a repeat catheterization where careful hemodynamic study was again done. Her aortic stenosis was felt to be significant enough to contribute to her symptoms and she underwent aortic valve replacement with a bioprosthesis. Postoperatively she developed a left hydrothorax and required a vats procedure. She is now doing well. She notes that her blood pressure is been elevated. She's had no recurrent chest pain. She has mild dyspnea which is unchanged. She denies cough, fevers, or chills.  Outpatient Encounter Prescriptions as of 06/12/2011  Medication Sig Dispense Refill  . acetaminophen (TYLENOL) 500 MG chewable tablet Chew 500 mg by mouth every 6 (six) hours as needed.        Marland Kitchen acyclovir (ZOVIRAX) 400 MG tablet TAKE ONE TABLET BY MOUTH THREE TIMES DAILY AS NEEDED FOR COLD SORE  30 tablet  1  . aspirin 81 MG tablet Take 81 mg by mouth daily.        . clopidogrel (PLAVIX) 75 MG tablet Take 75 mg by mouth daily.        . CRESTOR 40 MG tablet TAKE ONE TABLET BY MOUTH EVERY DAY  30 each  8  . DEXILANT 60 MG capsule TAKE ONE CAPSULE BY MOUTH EVERY DAY  30 capsule  11  . estradiol (VIVELLE-DOT) 0.05 MG/24HR Place 1 patch onto the skin. Change twice a week       . folic acid (FOLVITE) 1 MG tablet Take 1 mg by mouth daily.        . furosemide (LASIX) 20 MG tablet Take 20 mg by mouth daily.        . mometasone-formoterol (DULERA) 100-5 MCG/ACT AERO Inhale 2 puffs  into the lungs as directed.        . nadolol (CORGARD) 20 MG tablet Take 20 mg by mouth 2 (two) times daily.        . potassium chloride (K-DUR) 10 MEQ tablet Take 10 mEq by mouth daily.        Marland Kitchen PRISTIQ 100 MG 24 hr tablet TAKE ONE TABLET BY MOUTH EVERY DAY.  30 each  5  . SPIRIVA HANDIHALER 18 MCG inhalation capsule INHALE ONE PUFF EVERY DAY  30 each  6  . DISCONTD: metoprolol tartrate (LOPRESSOR) 25 MG tablet Take 25 mg by mouth 2 (two) times daily. Only taking once daily       . losartan (COZAAR) 50 MG tablet Take 1 tablet (50 mg total) by mouth daily.  90 tablet  3    Codeine sulfate; Hydrocodone-acetaminophen; and ZOX:[id number]+XBJYNWGNF+[id number] acid+aspartame  Past Medical History  Diagnosis Date  . ANXIETY DEPRESSION 02/15/2009  . AORTIC STENOSIS 07/07/2010    a. echo 12/23/10: EF 60-65%, mod to severe AS with mean gradient 29 mmHg  . Barrett's esophagus 04/14/2007  . CAD, NATIVE VESSEL 05/17/2010    a. NSTEMI 8/11 - BMS to RCA;  b. cath 4/12: dLAD 50%, RI 80% (PCI), AVCFX 30%, pRCA 30%, stent ok, 40-50, then 50% dist RCA;  c.  s/p Promus DES to RI 12/25/10  . Carotid stenosis 05/17/2010    a. dopplers 11/11: RICA 40-50%; LICA 0-39% (follow up due 11/12)  . 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 Herpes simplex without mention of complication 01/16/2010  . HYPERLIPIDEMIA, MILD 08/29/2009  . HYPERTENSION 04/14/2007  . MIGRAINE HEADACHE 04/20/2008  . OSTEOPENIA 04/14/2007  . URINARY INCONTINENCE, STRESS, MILD 08/26/2007  . GERD (gastroesophageal reflux disease)   . Menopause   . Ovarian cyst   . Migraine   . Urine, incontinence, stress female   . Adenomatous colon polyp     Past Surgical History  Procedure Date  . Total abdominal hysterectomy   . Tonsillectomy and adenoidectomy   . Nasal sinus surgery     x 2  . Eye lid surgery   . Thrombosed hemorrohid lanced   . Coronary artery bypass graft     History   Social History  . Marital Status: Single     Spouse Name: N/A    Number of Children: N/A  . Years of Education: N/A   Occupational History  . Not on file.   Social History Main Topics  . Smoking status: Former Games developer  . Smokeless tobacco: Not on file   Comment: quit in 2005  . Alcohol Use: No  . Drug Use: No  . Sexually Active: Yes   Other Topics Concern  . Not on file   Social History Narrative  . No narrative on file    Family History  Problem Relation Age of Onset  . Coronary artery disease Other   . Heart disease Other     6 stents  . Heart disease Mother   . COPD Father   . Heart disease Father   . Heart disease Maternal Uncle     ROS: General: no fevers/chills/night sweats. Positive for generalized fatigue. Eyes: no blurry vision, diplopia, or amaurosis ENT: no sore throat or hearing loss Resp: no cough, wheezing, or hemoptysis CV: no edema or palpitations GI: no abdominal pain, nausea, vomiting, diarrhea, or constipation GU: no dysuria, frequency, or hematuria Skin: no rash Neuro: no headache, numbness, tingling, or weakness of extremities Musculoskeletal: no joint pain or swelling Heme: no bleeding, DVT, or easy bruising Endo: no polydipsia or polyuria  BP 162/82  Pulse 65  Resp 18  Ht 5\' 2"  (1.575 m)  Wt 137 lb 12.8 oz (62.506 kg)  BMI 25.20 kg/m2  PHYSICAL EXAM: Pt is alert and oriented, WD, WN, in no distress. HEENT: normal Neck: JVP normal. Carotid upstrokes normal without bruits. No thyromegaly. Lungs: equal expansion, clear bilaterally CV: Apex is discrete and nondisplaced, RRR without murmur or gallop Abd: soft, NT, +BS, no bruit, no hepatosplenomegaly Back: no CVA tenderness Ext: no C/C/E        Femoral pulses 2+= without bruits        DP/PT pulses intact and = Skin: warm and dry without rash Neuro: CNII-XII intact             Strength intact = bilaterally  EKG:  Normal sinus rhythm 65 beats per minute, within normal limits.  ASSESSMENT AND PLAN:

## 2011-06-17 NOTE — Assessment & Plan Note (Signed)
The patient is status post bioprosthetic aVR. She had recent surgery and is recovering. Will continue with regular clinical followup.

## 2011-06-17 NOTE — Assessment & Plan Note (Signed)
The patient is stable without angina. She will continue her current medical program which includes dual antiplatelet therapy with aspirin and Plavix.

## 2011-06-17 NOTE — Assessment & Plan Note (Signed)
Blood pressure control is suboptimal. She is on a beta blocker as well as furosemide. Recommend start losartan 50 mg daily.

## 2011-06-18 ENCOUNTER — Other Ambulatory Visit: Payer: Self-pay | Admitting: Internal Medicine

## 2011-06-19 ENCOUNTER — Encounter: Payer: Self-pay | Admitting: Internal Medicine

## 2011-06-19 ENCOUNTER — Ambulatory Visit (INDEPENDENT_AMBULATORY_CARE_PROVIDER_SITE_OTHER): Payer: BC Managed Care – PPO | Admitting: Internal Medicine

## 2011-06-19 VITALS — BP 130/80 | HR 72 | Temp 98.2°F | Resp 20 | Ht 62.5 in | Wt 136.0 lb

## 2011-06-19 DIAGNOSIS — I2510 Atherosclerotic heart disease of native coronary artery without angina pectoris: Secondary | ICD-10-CM

## 2011-06-19 DIAGNOSIS — Z23 Encounter for immunization: Secondary | ICD-10-CM

## 2011-06-19 DIAGNOSIS — E785 Hyperlipidemia, unspecified: Secondary | ICD-10-CM

## 2011-06-19 DIAGNOSIS — J449 Chronic obstructive pulmonary disease, unspecified: Secondary | ICD-10-CM

## 2011-06-19 DIAGNOSIS — I10 Essential (primary) hypertension: Secondary | ICD-10-CM

## 2011-06-19 DIAGNOSIS — T887XXA Unspecified adverse effect of drug or medicament, initial encounter: Secondary | ICD-10-CM

## 2011-06-19 LAB — LIPID PANEL
Cholesterol: 150 mg/dL (ref 0–200)
HDL: 55.7 mg/dL (ref >39.00)
Triglycerides: 132 mg/dL (ref 0.0–149.0)
VLDL: 26.4 mg/dL (ref 0.0–40.0)

## 2011-06-19 LAB — BASIC METABOLIC PANEL
Calcium: 9.2 mg/dL (ref 8.4–10.5)
Creatinine, Ser: 0.6 mg/dL (ref 0.4–1.2)
GFR: 109.71 mL/min (ref >60.00)
Glucose, Bld: 100 mg/dL — ABNORMAL HIGH (ref 70–99)
Sodium: 136 mEq/L (ref 135–145)

## 2011-06-19 LAB — CBC WITH DIFFERENTIAL/PLATELET
Basophils Absolute: 0 10*3/uL (ref 0.0–0.1)
Eosinophils Absolute: 0 10*3/uL (ref 0.0–0.7)
Hemoglobin: 11.6 g/dL — ABNORMAL LOW (ref 12.0–15.0)
Lymphocytes Relative: 21.4 % (ref 12.0–46.0)
Monocytes Relative: 10.4 % (ref 3.0–12.0)
Neutro Abs: 4.1 10*3/uL (ref 1.4–7.7)
Neutrophils Relative %: 67.8 % (ref 43.0–77.0)
RBC: 4.25 Mil/uL (ref 3.87–5.11)
RDW: 17.1 % — ABNORMAL HIGH (ref 11.5–14.6)

## 2011-06-19 LAB — HEPATIC FUNCTION PANEL
ALT: 25 U/L (ref 0–35)
AST: 24 U/L (ref 0–37)
Albumin: 3.7 g/dL (ref 3.5–5.2)
Total Bilirubin: 0.3 mg/dL (ref 0.3–1.2)
Total Protein: 7 g/dL (ref 6.0–8.3)

## 2011-06-19 MED ORDER — ALBUTEROL SULFATE HFA 108 (90 BASE) MCG/ACT IN AERS: 2.0000 | INHALATION_SPRAY | Freq: Four times a day (QID) | RESPIRATORY_TRACT | Status: DC | PRN

## 2011-06-19 MED ORDER — CLOPIDOGREL BISULFATE 75 MG PO TABS: 75.0000 mg | ORAL_TABLET | Freq: Every day | ORAL | Status: DC

## 2011-06-19 NOTE — Progress Notes (Signed)
Subjective:    Patient ID: Katherine Foley, female    DOB: [birth year], 60 y.o.   MRN: [id number]  HPI persistant pain in the left side and lung area after lung  Loculated hemothorax post CABG with failed thoracentesis and need for open surgery. She had a hx of severe pneumonia in that lung field in 2007? Dr [name] is following the surgery She had CABG and valve repair  The last xray showed small residual that appeared to be resolving    Review of Systems  Constitutional: Negative for activity change, appetite change and fatigue.  HENT: Negative for ear pain, congestion, neck pain, postnasal drip and sinus pressure.   Eyes: Negative for redness and visual disturbance.  Respiratory: Positive for chest tightness and shortness of breath. Negative for cough and wheezing.   Cardiovascular: Positive for chest pain. Negative for palpitations and leg swelling.  Gastrointestinal: Negative for abdominal pain and abdominal distention.  Genitourinary: Negative for dysuria, frequency and menstrual problem.  Musculoskeletal: Negative for myalgias, joint swelling and arthralgias.  Skin: Negative for rash and wound.  Neurological: Negative for dizziness, weakness and headaches.  Hematological: Negative for adenopathy. Does not bruise/bleed easily.  Psychiatric/Behavioral: Negative for sleep disturbance and decreased concentration.   Past Medical History  Diagnosis Date  . ANXIETY DEPRESSION 02/15/2009  . AORTIC STENOSIS 07/07/2010    a. echo 12/23/10: EF 60-65%, mod to severe AS with mean gradient 29 mmHg  . Barrett's esophagus 04/14/2007  . CAD, NATIVE VESSEL 05/17/2010    a. NSTEMI 8/11 - BMS to RCA;  b. cath 4/12: dLAD 50%, RI 80% (PCI), AVCFX 30%, pRCA 30%, stent ok, 40-50, then 50% dist RCA;  c. s/p Promus DES to RI 12/25/10  . Carotid stenosis 05/17/2010    a. dopplers 11/11: RICA 40-50%; LICA 0-39% (follow up due 11/12)  . 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 Herpes  simplex without mention of complication 01/16/2010  . HYPERLIPIDEMIA, MILD 08/29/2009  . HYPERTENSION 04/14/2007  . MIGRAINE HEADACHE 04/20/2008  . OSTEOPENIA 04/14/2007  . URINARY INCONTINENCE, STRESS, MILD 08/26/2007  . GERD (gastroesophageal reflux disease)   . Menopause   . Ovarian cyst   . Migraine   . Urine, incontinence, stress female   . Adenomatous colon polyp    Past Surgical History  Procedure Date  . Total abdominal hysterectomy   . Tonsillectomy and adenoidectomy   . Nasal sinus surgery     x 2  . Eye lid surgery   . Thrombosed hemorrohid lanced   . Coronary artery bypass graft     reports that she has quit smoking. She does not have any smokeless tobacco history on file. She reports that she does not drink alcohol or use illicit drugs. family history includes COPD in her father; Coronary artery disease in her other; and Heart disease in her father, maternal uncle, mother, and other. Allergies  Allergen Reactions  . Codeine Sulfate Rash    REACTION: unspecified  . Hydrocodone-Acetaminophen Rash    REACTION: unspecified  . YQM:[id number]+[id number] Acid+Aspartame     REACTION: unspecified        Objective:   Physical Exam  Nursing note and vitals reviewed. Constitutional: She is oriented to person, place, and time. She appears well-developed and well-nourished. No distress.  HENT:  Head: Normocephalic and atraumatic.  Right Ear: External ear normal.  Left Ear: External ear normal.  Nose: Nose normal.  Mouth/Throat: Oropharynx is clear and moist.  Eyes: Conjunctivae and EOM are normal. Pupils  are equal, round, and reactive to light.  Neck: Normal range of motion. Neck supple. No JVD present. No tracheal deviation present. No thyromegaly present.  Cardiovascular: Normal rate, regular rhythm, normal heart sounds and intact distal pulses.   No murmur heard. Pulmonary/Chest: Effort normal and breath sounds normal. She has no wheezes. She exhibits no  tenderness.       slight dullness left base   Abdominal: Soft. Bowel sounds are normal.  Musculoskeletal: Normal range of motion. She exhibits no edema and no tenderness.  Lymphadenopathy:    She has no cervical adenopathy.  Neurological: She is alert and oriented to person, place, and time. She has normal reflexes. No cranial nerve deficit.  Skin: Skin is warm and dry. She is not diaphoretic.  Psychiatric: She has a normal mood and affect. Her behavior is normal.          Assessment & Plan:  She will start cardiac rehab soon. When she sees the surgeon if she is cleared I recommend she start then. Will initiate a beta agonist for the atelectasis in the lung fields would not resume the dulera at this point until complete healing has occurred. I believe that cardiac rehabilitation will be essential from a pulmonary standpoint as well as from a cardiovascular standpoint blood pressure at this point is stable she is having chest pain other than the pain of the lung fields shows no evidence of CHF.  She will have a chest x-ray next week at the thoracic surgeon. If continued resolution of the small effusion I would recommend that she begin cardiovascular rehabilitation. She is postsurgical which may influence the reliability of the lipid profile but we will draw one today and keep that in mind as review the numbers. More importantly, we will look at her basic metabolic panel to ensure that renal function and if stable

## 2011-06-19 NOTE — Patient Instructions (Signed)
Please call my office after you have been cleared by cardiothoracic surgery so that we can help expedite your beginning cardiac rehabilitation

## 2011-06-21 ENCOUNTER — Other Ambulatory Visit: Payer: Self-pay | Admitting: Thoracic Surgery (Cardiothoracic Vascular Surgery)

## 2011-06-21 ENCOUNTER — Other Ambulatory Visit: Payer: Self-pay | Admitting: Internal Medicine

## 2011-06-21 DIAGNOSIS — I359 Nonrheumatic aortic valve disorder, unspecified: Secondary | ICD-10-CM

## 2011-06-25 ENCOUNTER — Other Ambulatory Visit: Payer: Self-pay | Admitting: *Deleted

## 2011-06-25 MED ORDER — ESTRADIOL 0.05 MG/24HR TD PTTW: 1.0000 | MEDICATED_PATCH | TRANSDERMAL | Status: DC

## 2011-06-26 ENCOUNTER — Ambulatory Visit
Admission: RE | Admit: 2011-06-26 | Discharge: 2011-06-26 | Disposition: A | Discharge status: Home / Self Care | Payer: BC Managed Care – PPO | Source: Ambulatory Visit | Attending: Thoracic Surgery (Cardiothoracic Vascular Surgery) | Admitting: Thoracic Surgery (Cardiothoracic Vascular Surgery)

## 2011-06-26 ENCOUNTER — Ambulatory Visit (INDEPENDENT_AMBULATORY_CARE_PROVIDER_SITE_OTHER): Payer: Self-pay | Admitting: Thoracic Surgery (Cardiothoracic Vascular Surgery)

## 2011-06-26 ENCOUNTER — Encounter: Payer: Self-pay | Admitting: Thoracic Surgery (Cardiothoracic Vascular Surgery)

## 2011-06-26 VITALS — BP 130/80 | HR 75 | Resp 20 | Ht 62.5 in | Wt 140.0 lb

## 2011-06-26 DIAGNOSIS — J90 Pleural effusion, not elsewhere classified: Secondary | ICD-10-CM

## 2011-06-26 DIAGNOSIS — I2510 Atherosclerotic heart disease of native coronary artery without angina pectoris: Secondary | ICD-10-CM

## 2011-06-26 DIAGNOSIS — I350 Nonrheumatic aortic (valve) stenosis: Secondary | ICD-10-CM

## 2011-06-26 DIAGNOSIS — I359 Nonrheumatic aortic valve disorder, unspecified: Secondary | ICD-10-CM

## 2011-06-26 NOTE — Progress Notes (Signed)
PCP is Carrie Mew, MD Referring Provider is Carrie Mew, MD  Chief Complaint  Patient presents with  . Routine Post Op    3 week f/u with CXR, S/P Lt VATS for drainage of loculated of Left hydropneumothorax 05/23/2011, AVR on 05/01/11    HPI: Katherine Foley is a 60 year old woman who had aortic valve replacement and coronary bypass grafting x1 on August 28. She did well in the initial postoperative period. But presented back with shortness of breath she was seen by Dr. [name] as I was away at time. He did a left vats for a loculated left pleural effusion on September 19. I saw her in the office on October 2 at which time chest x-ray showed the loculated left pleural effusion was much improved, but she did still have some loculated pleural fluid. She was given a prednisone taper and now returns for followup.  She says that she still has some shortness of breath without any real aggravating factors. She was recently started on albuterol by Dr. Lovell Sheehan and feels that her breathing has improved since she started that medication. She denies any incisional chest pain. Past Medical History  Diagnosis Date  . ANXIETY DEPRESSION 02/15/2009  . AORTIC STENOSIS 07/07/2010    a. echo 12/23/10: EF 60-65%, mod to severe AS with mean gradient 29 mmHg  . Barrett's esophagus 04/14/2007  . CAD, NATIVE VESSEL 05/17/2010    a. NSTEMI 8/11 - BMS to RCA;  b. cath 4/12: dLAD 50%, RI 80% (PCI), AVCFX 30%, pRCA 30%, stent ok, 40-50, then 50% dist RCA;  c. s/p Promus DES to RI 12/25/10  . Carotid stenosis 05/17/2010    a. dopplers 11/11: RICA 40-50%; LICA 0-39% (follow up due 11/12)  . 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 Herpes simplex without mention of complication 01/16/2010  . HYPERLIPIDEMIA, MILD 08/29/2009  . HYPERTENSION 04/14/2007  . MIGRAINE HEADACHE 04/20/2008  . OSTEOPENIA 04/14/2007  . URINARY INCONTINENCE, STRESS, MILD 08/26/2007  . GERD (gastroesophageal reflux disease)   .  Menopause   . Ovarian cyst   . Migraine   . Urine, incontinence, stress female   . Adenomatous colon polyp     Past Surgical History  Procedure Date  . Total abdominal hysterectomy   . Tonsillectomy and adenoidectomy   . Nasal sinus surgery     x 2  . Eye lid surgery   . Thrombosed hemorrohid lanced   . Coronary artery bypass graft     Family History  Problem Relation Age of Onset  . Coronary artery disease Other   . Heart disease Other     6 stents  . Heart disease Mother   . COPD Father   . Heart disease Father   . Heart disease Maternal Uncle     Social History History  Substance Use Topics  . Smoking status: Former Games developer  . Smokeless tobacco: Not on Foley   Comment: quit in 2005  . Alcohol Use: No    Current Outpatient Prescriptions  Medication Sig Dispense Refill  . acyclovir (ZOVIRAX) 400 MG tablet TAKE ONE TABLET BY MOUTH THREE TIMES DAILY AS NEEDED FOR COLD SORE  30 tablet  1  . albuterol (PROVENTIL HFA) 108 (90 BASE) MCG/ACT inhaler Inhale 2 puffs into the lungs every 6 (six) hours as needed for wheezing.      Marland Kitchen aspirin 81 MG tablet Take 81 mg by mouth daily.        . clopidogrel (PLAVIX) 75 MG tablet  Take 1 tablet (75 mg total) by mouth daily.  30 tablet  11  . CRESTOR 40 MG tablet TAKE ONE TABLET BY MOUTH EVERY DAY  30 each  8  . DEXILANT 60 MG capsule TAKE ONE CAPSULE BY MOUTH EVERY DAY  30 capsule  11  . estradiol (VIVELLE-DOT) 0.05 MG/24HR Place 1 patch (0.05 mg total) onto the skin 2 (two) times a week. Change twice a week  10 patch  11  . losartan (COZAAR) 50 MG tablet Take 1 tablet (50 mg total) by mouth daily.  90 tablet  3  . nadolol (CORGARD) 20 MG tablet TAKE ONE TABLET BY MOUTH TWICE DAILY(INCREASED 05/18/2010)  60 tablet  6  . PRISTIQ 100 MG 24 hr tablet TAKE ONE TABLET BY MOUTH EVERY DAY.  30 each  6  . SPIRIVA HANDIHALER 18 MCG inhalation capsule INHALE ONE PUFF EVERY DAY  30 each  6    Allergies  Allergen Reactions  . Codeine Sulfate  Rash    REACTION: unspecified  . Hydrocodone-Acetaminophen Rash    REACTION: unspecified  . BJY:[id number]+MVHQIONGE+XBMWUXLKGM Acid+Aspartame     REACTION: unspecified    Review of Systems: See history of present illness. In addition she still has poor appetite, and lack of energy.  BP 130/80  Pulse 75  Resp 20  Ht 5' 2.5" (1.588 m)  Wt 140 lb (63.504 kg)  BMI 25.20 kg/m2  SpO2 98% Physical Exam: Well-appearing 60 year old female in no distress Neurologically intact but mildly depressed affect Lungs with mildly diminished breath sounds left base, no wheezing or rales Cardiac regular rate and rhythm normal S1 and S2 no rubs or gallops Sternal incision clean dry and intact, tenderness to palpation along both costal margins  Diagnostic Tests: Chest x-ray shows persistent loculated pleural effusion on the left, most of the fluid appears to be loculated anterior laterally, improved particularly posteriorly at the base comparison to the film from 06/05/11  Impression: Katherine Foley is a 60 year old woman is status post aortic valve replacement and coronary bypass grafting, this was complicated by a loculated left pleural effusion which required vats to drain about a month ago. Her progress is relatively slow, which is not unexpected after having to have an additional procedure. She really does not have any cardiac issues at this time. She does still have some loculated fluid in the left chest however I don't think the best not to the causing her any difficulty with her breathing, and certainly is not enough to warrant any intervention at this point in time.  The don't think that Katherine Foley will be ready to return to work in November, she should probably be able to return after the first of the year.  Plan: She will continue to be followed by Dr. Lovell Sheehan and Dr. Excell Seltzer. I will be happy to see her back at any time if I can be of any further assistance with her care.

## 2011-07-03 ENCOUNTER — Telehealth: Payer: Self-pay | Admitting: Cardiovascular Disease

## 2011-07-03 NOTE — Telephone Encounter (Signed)
Pt is scheduled start cardiac rehab and the order was faxed and she has not gotten it back and pt is to start Thursday fax# [phone]

## 2011-07-03 NOTE — Telephone Encounter (Signed)
Triage nurse----Please call Dr Excell Seltzer and see if he has this order for cardiac rehab.  I do not have it in his paperwork in the office.  If he has the form then he can sign it and have it faxed or taken to the cardiac rehab department.  Thank you.

## 2011-07-05 ENCOUNTER — Encounter: Payer: Self-pay | Admitting: *Deleted

## 2011-07-05 NOTE — Telephone Encounter (Signed)
I spoke with Katherine Foley at [facility] and she will see if an order was sent yesterday for this pt to start cardiac rehab. She will call me back to let me know if the pt still needs an order.

## 2011-07-05 NOTE — Telephone Encounter (Signed)
[facility] Nixon called back and they have not received an order for cardiac rehab.  I will have Dr Excell Seltzer sign an order today and fax to rehab.

## 2011-07-10 NOTE — Telephone Encounter (Signed)
Order for cardiac rehab signed and faxed.

## 2011-07-11 ENCOUNTER — Encounter (HOSPITAL_COMMUNITY): Payer: BC Managed Care – PPO

## 2011-07-11 DIAGNOSIS — I10 Essential (primary) hypertension: Secondary | ICD-10-CM | POA: Insufficient documentation

## 2011-07-11 DIAGNOSIS — Z87891 Personal history of nicotine dependence: Secondary | ICD-10-CM | POA: Insufficient documentation

## 2011-07-11 DIAGNOSIS — K219 Gastro-esophageal reflux disease without esophagitis: Secondary | ICD-10-CM | POA: Insufficient documentation

## 2011-07-11 DIAGNOSIS — Z7982 Long term (current) use of aspirin: Secondary | ICD-10-CM | POA: Insufficient documentation

## 2011-07-11 DIAGNOSIS — I359 Nonrheumatic aortic valve disorder, unspecified: Secondary | ICD-10-CM | POA: Insufficient documentation

## 2011-07-11 DIAGNOSIS — K2270 Barrett's esophagus without dysplasia: Secondary | ICD-10-CM | POA: Insufficient documentation

## 2011-07-11 DIAGNOSIS — F341 Dysthymic disorder: Secondary | ICD-10-CM | POA: Insufficient documentation

## 2011-07-11 DIAGNOSIS — J449 Chronic obstructive pulmonary disease, unspecified: Secondary | ICD-10-CM | POA: Insufficient documentation

## 2011-07-11 DIAGNOSIS — I252 Old myocardial infarction: Secondary | ICD-10-CM | POA: Insufficient documentation

## 2011-07-11 DIAGNOSIS — Z954 Presence of other heart-valve replacement: Secondary | ICD-10-CM | POA: Insufficient documentation

## 2011-07-11 DIAGNOSIS — J4489 Other specified chronic obstructive pulmonary disease: Secondary | ICD-10-CM | POA: Insufficient documentation

## 2011-07-11 DIAGNOSIS — E785 Hyperlipidemia, unspecified: Secondary | ICD-10-CM | POA: Insufficient documentation

## 2011-07-11 DIAGNOSIS — Z5189 Encounter for other specified aftercare: Secondary | ICD-10-CM | POA: Insufficient documentation

## 2011-07-11 DIAGNOSIS — Z951 Presence of aortocoronary bypass graft: Secondary | ICD-10-CM | POA: Insufficient documentation

## 2011-07-11 NOTE — Progress Notes (Signed)
Katherine Foley 60 y.o. female                                                              Nutrition Screen             YES  NO Do you live in a nursing home?  X   Do you eat out more than 3 times/week?    X If yes, how many times per week do you eat out?  Do you have food allergies?   X If yes, what are you allergic to?  Have you gained or lost more than 10 lbs without trying?               X If yes, how much weight have you lost and over what time period?  *** lbs gained or lost over ***weeks/month  Do you want to lose weight?    X  If yes, what is a goal weight or amount of weight you would like to lose? 10#  Do you eat alone most of the time?   X   Do you eat less than 2 meals/day?  X If yes, how many meals do you eat?  Do you drink more than 3 alcohol drinks/day?  X If yes, how many drinks per day?  Are you having trouble with constipation? * X  If yes, what are you doing to help relieve constipation? Stool softner  Do you have financial difficulties with buying food?*    X   Are you experiencing regular nausea/ vomiting?*     X   Do you have a poor appetite? *                                        X   Do you have trouble chewing/swallowing? *   X    Pt with diagnoses of:  X CABG             X MI                     X Stent/ PTCA (history of)    X GERD        X COPD          X AVR        X A1c >6  X Dyslipidemia  X %  Body fat >goal / Body Mass Index >25 X HTN / BP >120/80       Pt Risk Score    6       Diagnosis Risk Score  95       Total Risk Score   111                         X High Risk                 Low Risk              HT: 63.25  WT: 64.4 (141.7#) Wt Readings from Last 3 Encounters:  06/26/11 140 lb (63.504 kg)  06/19/11 136 lb (61.689 kg)  06/12/11 137 lb 12.8 oz (62.506 kg)  IBW 52.8 122%IBW BMI 25.0 36.6%body fat   has a current medication list which includes the following prescription(s): acyclovir, albuterol, aspirin, clopidogrel, crestor, dexilant,  estradiol, losartan, nadolol, pristiq, and spiriva handihaler. Past Medical History  Diagnosis Date  . ANXIETY DEPRESSION 02/15/2009  . AORTIC STENOSIS 07/07/2010    a. echo 12/23/10: EF 60-65%, mod to severe AS with mean gradient 29 mmHg  . Barrett's esophagus 04/14/2007  . CAD, NATIVE VESSEL 05/17/2010    a. NSTEMI 8/11 - BMS to RCA;  b. cath 4/12: dLAD 50%, RI 80% (PCI), AVCFX 30%, pRCA 30%, stent ok, 40-50, then 50% dist RCA;  c. s/p Promus DES to RI 12/25/10  . Carotid stenosis 05/17/2010    a. dopplers 11/11: RICA 40-50%; LICA 0-39% (follow up due 11/12)  . 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 Herpes simplex without mention of complication 01/16/2010  . HYPERLIPIDEMIA, MILD 08/29/2009  . HYPERTENSION 04/14/2007  . MIGRAINE HEADACHE 04/20/2008  . OSTEOPENIA 04/14/2007  . URINARY INCONTINENCE, STRESS, MILD 08/26/2007  . GERD (gastroesophageal reflux disease)   . Menopause   . Ovarian cyst   . Migraine   . Urine, incontinence, stress female   . Adenomatous colon polyp   s/p L VATS for drainage of left hydropneumothorax 05/23/11  Activity level: Pt is moderately active  Wt goal: 132# (60 kg) Current tobacco use? No      Food/Drug Interaction? No  Labs:  Lipids 06/19/11 Total Cholesterol 150  Triglyceride 132       HDL 55.7  LDL 68                  05/27/11 Glucose 114    Lab Results  Component Value Date   HGBA1C 6.2* 04/27/2011    LDL goal: < 70     MI, DM, Carotid or PVD and > 2:      tobacco, HTN, HDL, family h/o, lipoprotein a     > 62 yo female or        >62 yo female   Estimated Daily Nutrition Needs for: ? wt loss 1200-1400 Kcal , Total Fat 30-38 gm, Saturated Fat 8-11 gm, Trans Fat 1.1-1.4 gm,  Sodium 1500 mg or less

## 2011-07-11 NOTE — Progress Notes (Signed)
Pt started cardiac rehab today.  Telemetry NSR. Pt tolerated light exercise without difficulty. Oxygen saturations monitored range 92% to 97% on room air.  Will continue to monitor the patient throughout the program.

## 2011-07-13 ENCOUNTER — Encounter (HOSPITAL_COMMUNITY): Payer: BC Managed Care – PPO

## 2011-07-16 ENCOUNTER — Encounter (HOSPITAL_COMMUNITY): Payer: BC Managed Care – PPO

## 2011-07-16 NOTE — Progress Notes (Signed)
Reviewed home exercise with pt today.  Pt plans to continue to walk for exercise.  Reviewed THR, pulse, RPE, sign and symptoms, and when to call 911 or MD.  Pt voiced understanding.

## 2011-07-18 ENCOUNTER — Encounter (HOSPITAL_COMMUNITY): Payer: BC Managed Care – PPO

## 2011-07-20 ENCOUNTER — Encounter (HOSPITAL_COMMUNITY)
Admission: RE | Admit: 2011-07-20 | Discharge: 2011-07-20 | Disposition: A | Discharge status: Home / Self Care | Payer: BC Managed Care – PPO | Source: Ambulatory Visit | Attending: Cardiovascular Disease | Admitting: Cardiovascular Disease

## 2011-07-23 ENCOUNTER — Encounter (HOSPITAL_COMMUNITY)
Admission: RE | Admit: 2011-07-23 | Discharge: 2011-07-23 | Disposition: A | Discharge status: Home / Self Care | Payer: BC Managed Care – PPO | Source: Ambulatory Visit | Attending: Cardiovascular Disease | Admitting: Cardiovascular Disease

## 2011-07-23 NOTE — Progress Notes (Signed)
Katherine Foley 60 y.o. female  Nutrition Note  Spoke with pt.  Nutrition Plan and Nutrition Survey reviewed with pt.  Weight loss tips discussed.  Pt denies ever "dieting" previously to lose wt.  Pt states, "whenever I try to lose wt it seems like I gain it."  Per discussion with pt, pt energy level remains low.  Pt trying to increase energy by drinking 3 mochachinos daily.  Pt states she drinks decaf coffee black when she's not drinking the mochachinos.  Healthier alternatives to sustain energy level and managing energy level discussed.  Pt reports her family is supportive of her and that she expects herself to be back to "normal."  Pt's A1c/pre-diabetes status discussed.  Pt encouraged to talk with her PCP re: pre-diabetes and future testing if needed.  Pt expressed understanding.  Per pt nutrition screen, pt c/o constipation.  Pt given a high fiber nutrition therapy handout to review.  Nutrition Diagnosis   Food-and nutrition-related knowledge deficit related to lack of exposure to information as related to diagnosis of: ? CVD ? Pre-DM (A1c 6.2)     Overweight related to excessive energy intake as evidenced by a BMI of 25.0  Nutrition RX/ Estimated Daily Nutrition Needs for: wt loss 1200-1400 Kcal, 30-38 gm fat, 8-11 gm sat fat, 1.1-1.4 gm trans-fat, <1500 mg sodium Nutrition Intervention   Pt's individual nutrition plan including cholesterol goals reviewed with pt.   Benefits of adopting Therapeutic Lifestyle Changes discussed when Medficts reviewed.   Pt to attend the Portion Distortion class   Pt to attend the  ? Nutrition I class                     ? Nutrition II class        ? Diabetes Blitz class                      ? Diabetes Q & A class   Pt given handouts for: ? wt loss ? pre-DM ? High fiber nutrition therapy to help prevent constipation.   Continue client-centered nutrition education by RD, as part of interdisciplinary care. Goal(s)   Pt to identify food quantities necessary to  achieve: ? wt loss to a goal wt of 132# at graduation from cardiac rehab.    Pt to describe the benefit of including fruits, vegetables, whole grains, and low-fat dairy products in a heart healthy meal plan.   Pt to verbalize a basic understanding of diabetic diet Monitor and Evaluate progress toward nutrition goal with team.

## 2011-07-25 ENCOUNTER — Encounter (HOSPITAL_COMMUNITY)
Admission: RE | Admit: 2011-07-25 | Discharge: 2011-07-25 | Disposition: A | Discharge status: Home / Self Care | Payer: BC Managed Care – PPO | Source: Ambulatory Visit | Attending: Cardiovascular Disease | Admitting: Cardiovascular Disease

## 2011-07-27 ENCOUNTER — Encounter (HOSPITAL_COMMUNITY): Payer: BC Managed Care – PPO

## 2011-07-30 ENCOUNTER — Encounter (HOSPITAL_COMMUNITY)
Admission: RE | Admit: 2011-07-30 | Discharge: 2011-07-30 | Disposition: A | Discharge status: Home / Self Care | Payer: BC Managed Care – PPO | Source: Ambulatory Visit | Attending: Cardiovascular Disease | Admitting: Cardiovascular Disease

## 2011-08-01 ENCOUNTER — Encounter (HOSPITAL_COMMUNITY): Payer: BC Managed Care – PPO

## 2011-08-03 ENCOUNTER — Encounter (HOSPITAL_COMMUNITY)
Admission: RE | Admit: 2011-08-03 | Discharge: 2011-08-03 | Disposition: A | Discharge status: Home / Self Care | Payer: BC Managed Care – PPO | Source: Ambulatory Visit | Attending: Cardiovascular Disease | Admitting: Cardiovascular Disease

## 2011-08-06 ENCOUNTER — Encounter: Payer: Self-pay | Admitting: Internal Medicine

## 2011-08-06 ENCOUNTER — Ambulatory Visit (INDEPENDENT_AMBULATORY_CARE_PROVIDER_SITE_OTHER): Payer: BC Managed Care – PPO | Admitting: Internal Medicine

## 2011-08-06 ENCOUNTER — Encounter (HOSPITAL_COMMUNITY)
Admission: RE | Admit: 2011-08-06 | Discharge: 2011-08-06 | Disposition: A | Discharge status: Home / Self Care | Payer: BC Managed Care – PPO | Source: Ambulatory Visit | Attending: Cardiovascular Disease | Admitting: Cardiovascular Disease

## 2011-08-06 DIAGNOSIS — E785 Hyperlipidemia, unspecified: Secondary | ICD-10-CM | POA: Insufficient documentation

## 2011-08-06 DIAGNOSIS — Z5189 Encounter for other specified aftercare: Secondary | ICD-10-CM | POA: Insufficient documentation

## 2011-08-06 DIAGNOSIS — R072 Precordial pain: Secondary | ICD-10-CM

## 2011-08-06 DIAGNOSIS — J449 Chronic obstructive pulmonary disease, unspecified: Secondary | ICD-10-CM | POA: Insufficient documentation

## 2011-08-06 DIAGNOSIS — Z951 Presence of aortocoronary bypass graft: Secondary | ICD-10-CM | POA: Insufficient documentation

## 2011-08-06 DIAGNOSIS — K2270 Barrett's esophagus without dysplasia: Secondary | ICD-10-CM | POA: Insufficient documentation

## 2011-08-06 DIAGNOSIS — I10 Essential (primary) hypertension: Secondary | ICD-10-CM

## 2011-08-06 DIAGNOSIS — Z87891 Personal history of nicotine dependence: Secondary | ICD-10-CM | POA: Insufficient documentation

## 2011-08-06 DIAGNOSIS — F341 Dysthymic disorder: Secondary | ICD-10-CM | POA: Insufficient documentation

## 2011-08-06 DIAGNOSIS — I252 Old myocardial infarction: Secondary | ICD-10-CM | POA: Insufficient documentation

## 2011-08-06 DIAGNOSIS — Z7982 Long term (current) use of aspirin: Secondary | ICD-10-CM | POA: Insufficient documentation

## 2011-08-06 DIAGNOSIS — K219 Gastro-esophageal reflux disease without esophagitis: Secondary | ICD-10-CM | POA: Insufficient documentation

## 2011-08-06 DIAGNOSIS — I359 Nonrheumatic aortic valve disorder, unspecified: Secondary | ICD-10-CM | POA: Insufficient documentation

## 2011-08-06 DIAGNOSIS — J4489 Other specified chronic obstructive pulmonary disease: Secondary | ICD-10-CM | POA: Insufficient documentation

## 2011-08-06 DIAGNOSIS — I319 Disease of pericardium, unspecified: Secondary | ICD-10-CM

## 2011-08-06 DIAGNOSIS — Z954 Presence of other heart-valve replacement: Secondary | ICD-10-CM | POA: Insufficient documentation

## 2011-08-06 NOTE — Progress Notes (Signed)
Subjective:    Patient ID: Katherine Foley, female    DOB: [birth year], 60 y.o.   MRN: [id number]  HPI patient is having positional chest pain that worsens when she lays back and is worse and she takes a deep breath.  It has both a pleuritic and positional component.  It is not exertional not associated with nausea diaphoresis or nor does it radiate into the jaw or the arm. It moves across her chest and into her throat This is been going on for approximately 1 week Does not report increased heartburn but her medicines have changed from dexilant to  Prevacid twice a day    Review of Systems  Constitutional: Negative for activity change, appetite change and fatigue.  HENT: Negative for ear pain, congestion, neck pain, postnasal drip and sinus pressure.   Eyes: Negative for redness and visual disturbance.  Respiratory: Positive for chest tightness. Negative for cough, shortness of breath and wheezing.   Gastrointestinal: Positive for nausea. Negative for abdominal pain and abdominal distention.       Dysphagia with solid phase  Genitourinary: Negative for dysuria, frequency and menstrual problem.  Musculoskeletal: Negative for myalgias, joint swelling and arthralgias.  Skin: Negative for rash and wound.  Neurological: Negative for dizziness, weakness and headaches.  Hematological: Negative for adenopathy. Does not bruise/bleed easily.  Psychiatric/Behavioral: Negative for sleep disturbance and decreased concentration.   Past Medical History  Diagnosis Date  . ANXIETY DEPRESSION 02/15/2009  . AORTIC STENOSIS 07/07/2010    a. echo 12/23/10: EF 60-65%, mod to severe AS with mean gradient 29 mmHg  . Barrett's esophagus 04/14/2007  . CAD, NATIVE VESSEL 05/17/2010    a. NSTEMI 8/11 - BMS to RCA;  b. cath 4/12: dLAD 50%, RI 80% (PCI), AVCFX 30%, pRCA 30%, stent ok, 40-50, then 50% dist RCA;  c. s/p Promus DES to RI 12/25/10  . Carotid stenosis 05/17/2010    a. dopplers 11/11: RICA 40-50%; LICA 0-39%  (follow up due 11/12)  . 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 Herpes simplex without mention of complication 01/16/2010  . HYPERLIPIDEMIA, MILD 08/29/2009  . HYPERTENSION 04/14/2007  . MIGRAINE HEADACHE 04/20/2008  . OSTEOPENIA 04/14/2007  . URINARY INCONTINENCE, STRESS, MILD 08/26/2007  . GERD (gastroesophageal reflux disease)   . Menopause   . Ovarian cyst   . Migraine   . Urine, incontinence, stress female   . Adenomatous colon polyp     History   Social History  . Marital Status: Single    Spouse Name: N/A    Number of Children: N/A  . Years of Education: N/A   Occupational History  . Not on file.   Social History Main Topics  . Smoking status: Former Games developer  . Smokeless tobacco: Not on file   Comment: quit in 2005  . Alcohol Use: No  . Drug Use: No  . Sexually Active: Yes   Other Topics Concern  . Not on file   Social History Narrative  . No narrative on file    Past Surgical History  Procedure Date  . Total abdominal hysterectomy   . Tonsillectomy and adenoidectomy   . Nasal sinus surgery     x 2  . Eye lid surgery   . Thrombosed hemorrohid lanced   . Coronary artery bypass graft     Family History  Problem Relation Age of Onset  . Coronary artery disease Other   . Heart disease Other     6 stents  .  Heart disease Mother   . COPD Father   . Heart disease Father   . Heart disease Maternal Uncle     Allergies  Allergen Reactions  . Codeine Sulfate Rash    REACTION: unspecified  . Hydrocodone-Acetaminophen Rash    REACTION: unspecified  . WUJ:[id number]+QIONGEXBMW Acid+Aspartame     REACTION: unspecified    Current Outpatient Prescriptions on File Prior to Visit  Medication Sig Dispense Refill  . acyclovir (ZOVIRAX) 400 MG tablet TAKE ONE TABLET BY MOUTH THREE TIMES DAILY AS NEEDED FOR COLD SORE  30 tablet  1  . albuterol (PROVENTIL HFA) 108 (90 BASE) MCG/ACT inhaler Inhale 2 puffs into the lungs every 6 (six)  hours as needed for wheezing.      Marland Kitchen aspirin 81 MG tablet Take 81 mg by mouth daily.        . clopidogrel (PLAVIX) 75 MG tablet Take 1 tablet (75 mg total) by mouth daily.  30 tablet  11  . CRESTOR 40 MG tablet TAKE ONE TABLET BY MOUTH EVERY DAY  30 each  8  . estradiol (VIVELLE-DOT) 0.05 MG/24HR Place 1 patch (0.05 mg total) onto the skin 2 (two) times a week. Change twice a week  10 patch  11  . losartan (COZAAR) 50 MG tablet Take 1 tablet (50 mg total) by mouth daily.  90 tablet  3  . nadolol (CORGARD) 20 MG tablet TAKE ONE TABLET BY MOUTH TWICE DAILY(INCREASED 05/18/2010)  60 tablet  6  . PRISTIQ 100 MG 24 hr tablet TAKE ONE TABLET BY MOUTH EVERY DAY.  30 each  6  . SPIRIVA HANDIHALER 18 MCG inhalation capsule INHALE ONE PUFF EVERY DAY  30 each  6    BP 100/68  Pulse 56  Temp 98.3 F (36.8 C)  Resp 16  Ht 5' 2.5" (1.588 m)  Wt 144 lb (65.318 kg)  BMI 25.92 kg/m2        Objective:   Physical Exam  Nursing note and vitals reviewed. Constitutional: She is oriented to person, place, and time. She appears well-developed and well-nourished. No distress.  HENT:  Head: Normocephalic and atraumatic.  Right Ear: External ear normal.  Left Ear: External ear normal.  Nose: Nose normal.  Mouth/Throat: Oropharynx is clear and moist.  Eyes: Conjunctivae and EOM are normal. Pupils are equal, round, and reactive to light.  Neck: Normal range of motion. Neck supple. No JVD present. No tracheal deviation present. No thyromegaly present.  Cardiovascular: Normal rate, regular rhythm, normal heart sounds and intact distal pulses.   No murmur heard.      Reproducible chest wall tenderness to palpation  Heart rate is trigeminy There is no positional change in the heart rate nor any murmur that is elicited by position change  Pulmonary/Chest: Effort normal and breath sounds normal. She has no wheezes. She exhibits no tenderness.  Abdominal: Soft. Bowel sounds are normal.  Musculoskeletal:  Normal range of motion. She exhibits no edema and no tenderness.  Lymphadenopathy:    She has no cervical adenopathy.  Neurological: She is alert and oriented to person, place, and time. She has normal reflexes. No cranial nerve deficit.  Skin: Skin is warm and dry. She is not diaphoretic.  Psychiatric: She has a normal mood and affect. Her behavior is normal.          Assessment & Plan:  Musculoskeletal chest wall pain following open heart surgery for valve replacement Obtain echocardiogram to assure that there is no pericardial effusion  or pericarditis Begin anti-inflammatory treatment with ibuprofen with Pepcid twice daily for 10 days The chest wall pain persists she'll contact our office the echocardiogram will be obtained as soon as possible this week

## 2011-08-06 NOTE — Patient Instructions (Signed)
Please call  me if you do not hear about the echocardiogram by tomorrow afternoon

## 2011-08-07 ENCOUNTER — Ambulatory Visit (HOSPITAL_COMMUNITY): Payer: BC Managed Care – PPO | Attending: Cardiology | Admitting: Radiology

## 2011-08-07 DIAGNOSIS — I319 Disease of pericardium, unspecified: Secondary | ICD-10-CM

## 2011-08-07 DIAGNOSIS — Z954 Presence of other heart-valve replacement: Secondary | ICD-10-CM | POA: Insufficient documentation

## 2011-08-07 DIAGNOSIS — I059 Rheumatic mitral valve disease, unspecified: Secondary | ICD-10-CM | POA: Insufficient documentation

## 2011-08-07 DIAGNOSIS — R072 Precordial pain: Secondary | ICD-10-CM

## 2011-08-07 DIAGNOSIS — I379 Nonrheumatic pulmonary valve disorder, unspecified: Secondary | ICD-10-CM | POA: Insufficient documentation

## 2011-08-07 DIAGNOSIS — Z951 Presence of aortocoronary bypass graft: Secondary | ICD-10-CM | POA: Insufficient documentation

## 2011-08-08 ENCOUNTER — Encounter (HOSPITAL_COMMUNITY)
Admission: RE | Admit: 2011-08-08 | Discharge: 2011-08-08 | Disposition: A | Discharge status: Home / Self Care | Payer: BC Managed Care – PPO | Source: Ambulatory Visit | Attending: Cardiovascular Disease | Admitting: Cardiovascular Disease

## 2011-08-10 ENCOUNTER — Encounter (HOSPITAL_COMMUNITY)
Admission: RE | Admit: 2011-08-10 | Discharge: 2011-08-10 | Disposition: A | Discharge status: Home / Self Care | Payer: BC Managed Care – PPO | Source: Ambulatory Visit | Attending: Cardiovascular Disease | Admitting: Cardiovascular Disease

## 2011-08-13 ENCOUNTER — Encounter (HOSPITAL_COMMUNITY)
Admission: RE | Admit: 2011-08-13 | Discharge: 2011-08-13 | Disposition: A | Discharge status: Home / Self Care | Payer: BC Managed Care – PPO | Source: Ambulatory Visit | Attending: Cardiovascular Disease | Admitting: Cardiovascular Disease

## 2011-08-15 ENCOUNTER — Encounter (HOSPITAL_COMMUNITY)
Admission: RE | Admit: 2011-08-15 | Discharge: 2011-08-15 | Disposition: A | Discharge status: Home / Self Care | Payer: BC Managed Care – PPO | Source: Ambulatory Visit | Attending: Cardiovascular Disease | Admitting: Cardiovascular Disease

## 2011-08-17 ENCOUNTER — Encounter (HOSPITAL_COMMUNITY)
Admission: RE | Admit: 2011-08-17 | Discharge: 2011-08-17 | Disposition: A | Discharge status: Home / Self Care | Payer: BC Managed Care – PPO | Source: Ambulatory Visit | Attending: Cardiovascular Disease | Admitting: Cardiovascular Disease

## 2011-08-20 ENCOUNTER — Encounter (HOSPITAL_COMMUNITY)
Admission: RE | Admit: 2011-08-20 | Discharge: 2011-08-20 | Disposition: A | Discharge status: Home / Self Care | Payer: BC Managed Care – PPO | Source: Ambulatory Visit | Attending: Cardiovascular Disease | Admitting: Cardiovascular Disease

## 2011-08-22 ENCOUNTER — Other Ambulatory Visit: Payer: Self-pay | Admitting: *Deleted

## 2011-08-22 ENCOUNTER — Encounter (HOSPITAL_COMMUNITY)
Admission: RE | Admit: 2011-08-22 | Discharge: 2011-08-22 | Payer: BC Managed Care – PPO | Source: Ambulatory Visit | Attending: Cardiovascular Disease | Admitting: Cardiovascular Disease

## 2011-08-22 ENCOUNTER — Encounter: Payer: Self-pay | Admitting: Family Medicine

## 2011-08-22 ENCOUNTER — Ambulatory Visit (INDEPENDENT_AMBULATORY_CARE_PROVIDER_SITE_OTHER)
Admission: RE | Admit: 2011-08-22 | Discharge: 2011-08-22 | Disposition: A | Discharge status: Home / Self Care | Payer: BC Managed Care – PPO | Source: Ambulatory Visit | Attending: Internal Medicine | Admitting: Internal Medicine

## 2011-08-22 ENCOUNTER — Ambulatory Visit (INDEPENDENT_AMBULATORY_CARE_PROVIDER_SITE_OTHER): Payer: BC Managed Care – PPO | Admitting: Family Medicine

## 2011-08-22 VITALS — BP 98/60 | HR 72 | Temp 97.8°F | Wt 145.0 lb

## 2011-08-22 DIAGNOSIS — R52 Pain, unspecified: Secondary | ICD-10-CM

## 2011-08-22 DIAGNOSIS — K219 Gastro-esophageal reflux disease without esophagitis: Secondary | ICD-10-CM

## 2011-08-22 DIAGNOSIS — J90 Pleural effusion, not elsewhere classified: Secondary | ICD-10-CM

## 2011-08-22 LAB — CBC WITH DIFFERENTIAL/PLATELET
Basophils Absolute: 0 10*3/uL (ref 0.0–0.1)
Basophils Relative: 0.4 % (ref 0.0–3.0)
Eosinophils Absolute: 0 10*3/uL (ref 0.0–0.7)
Hemoglobin: 10.7 g/dL — ABNORMAL LOW (ref 12.0–15.0)
Lymphocytes Relative: 20.3 % (ref 12.0–46.0)
MCHC: 32.8 g/dL (ref 30.0–36.0)
MCV: 85.8 fl (ref 78.0–100.0)
Monocytes Absolute: 1 10*3/uL (ref 0.1–1.0)
Neutro Abs: 5.4 10*3/uL (ref 1.4–7.7)
Neutrophils Relative %: 66.8 % (ref 43.0–77.0)
RBC: 3.81 Mil/uL — ABNORMAL LOW (ref 3.87–5.11)
RDW: 17.7 % — ABNORMAL HIGH (ref 11.5–14.6)

## 2011-08-22 MED ORDER — DEXLANSOPRAZOLE 30 MG PO CPDR: 30.0000 mg | DELAYED_RELEASE_CAPSULE | Freq: Every day | ORAL | Status: DC

## 2011-08-22 MED ORDER — PREDNISONE 20 MG PO TABS: 20.0000 mg | ORAL_TABLET | Freq: Every day | ORAL | Status: AC

## 2011-08-22 NOTE — Patient Instructions (Signed)
Pleural Effusion  The lining covering your lungs and the inside of your chest is called the pleura. Usually, the space between the 2 pleura contains no air and only a thin layer of fluid. A pleural effusion is an abnormal buildup of fluid in the pleural space.  Fluid gathers when there is increased pressure in the lung vessels. This forces fluids out of the lungs and into the pleural space. Vessels may also leak fluids when there are infections, such as pneumonia, or other causes of soreness and redness (inflammation). Fluids leak into the lungs when protein in the blood is low or when certain vessels (lymphatics) are blocked.  Finding a pleural effusion is important because it is usually caused by another disease. In order to treat a pleural effusion, your caregiver needs to find its cause. If left untreated, a large amount of fluid can build up and cause collapse of the lung.  CAUSES    Heart failure.   Infections (pneumonia, tuberculosis), pulmonary embolism, pulmonary infarction.   Cancer (primary lung and metastatic), asbestosis.   Liver failure (cirrhosis).   Nephrotic syndrome, peritoneal dialysis, kidney problems (uremia).   Collagen vascular disease (systemic lupus erythematosis, rheumatoid arthritis).   Injury (trauma) to the chest or rupture of the digestive tube (esophagus).   Material in the chest or pleural space (hemothorax, chylothorax).   Pancreatitis.   Surgery.   Drug reactions.  SYMPTOMS   A pleural effusion can decrease the amount of space available for breathing and make you short of breath. The fluid can become infected, which may cause pain and fever. Often, the pain is worse when taking a deep breath. The underlying disease (heart failure, pneumonia, blood clot, tuberculosis, cancer) may also cause symptoms.  DIAGNOSIS    Your caregiver can usually tell what is wrong by talking to you (taking a history), doing an exam, and taking a routine X-ray. If the X-ray shows fluid in your  chest, often fluid is removed from your chest with a needle for testing (diagnostic thoracentesis).   Sometimes, more specialized X-rays may be needed.   Sometimes, a small piece of tissue is removed and examined by a specialist (biopsy).  TREATMENT   Treatment varies based on what caused the pleural effusion. Treatments include:   Removing as much fluid as possible using a needle (thoracentesis) to improve the cough and shortness of breath. This is a simple procedure which can be done at bedside. The risks are bleeding, infection, collapse of a lung, or low blood pressure.   Placing a tube in the chest to drain the effusion (tube thoracostomy). This is often used when there is an infection in the fluid. This is a simple procedure which can often be done at bedside or in a clinic. The procedure may be painful. The risks are the same as using a needle to drain the fluid. The chest tube usually remains for a few days and is connected to suction to improve fluid drainage. The tube, after placement, usually does not cause much discomfort.   Surgical removal of fibrous debris in and around the pleural space (decortication). This may be done with a flexible telescope (thoracoscope) through a small or large cut (incision). This is helpful for patients who have fibrosis or scar tissue that prevents complete lung expansion. The risks are infection, blood loss, and side effects from general anesthesia.   Sometimes, a procedure called pleurodesis is done. A chest tube is placed and the fluid is drained. Next, an agent (  causes the lung and chest wall to stick together (adhesion). This leaves no potential space for fluid to build up. The risks include infection, blood loss, and side effects from general anesthesia.   If the effusion is caused by infection, it may be treated with antibiotics and improve without draining.  HOME CARE  INSTRUCTIONS   Take any medicines exactly as prescribed.   Follow up with your caregiver as directed.   Monitor your exercise capacity (the amount of walking you can do before you get short of breath).   Do not smoke. Ask your caregiver for help quitting.  SEEK MEDICAL CARE IF:   Your exercise capacity seems to get worse or does not improve with time.   You do not recover from your illness.  SEEK IMMEDIATE MEDICAL CARE IF:   Shortness of breath or chest pain develops or gets worse.   You have an oral temperature above 102 F (38.9 C), not controlled by medicine.   You develop a new cough, especially if the mucus (phlegm) is discolored.  MAKE SURE YOU:   Understand these instructions.   Will watch your condition.   Will get help right away if you are not doing well or get worse.  Document Released: 08/20/2005 Document Revised: 05/02/2011 Document Reviewed: 04/11/2007 [facility] Patient Information 2012 [place], [place].

## 2011-08-22 NOTE — Progress Notes (Signed)
Katherine Foley is complaining of having pain in her left shoulder area when she takes a deep breath since Monday. Keiana has been taking ibuprofen with relief.  Upon assessment lungs slightly diminished but essentially clear upon ascultation.  Dr [facility] office called and notified.  Patient to have a chest xray at the [facility] office then to see the nurse practitioner this afternoon.  SAO2 98% on RA.  No exercise today.Marland Kitchen

## 2011-08-22 NOTE — Progress Notes (Signed)
Subjective:    Patient ID: Katherine Foley, female    DOB: 05/30/1951, 60 y.o.   MRN: [id number]  Shoulder Pain    60 year old white female, former smoker, and with complaints of left shoulder pain and pain when she takes a deep breath. She denies any pain with range of motion. She is recovering from a pleural effusion that was discovered in October 2012. She was hospitalized, and had fluid drawn from the lung. Since then she's been doing pretty well, until recently developing in a similar pain in her left shoulder. She denies any lightheadedness, dizziness, shortness of breath, nausea, vomiting, or edema.  Patient also has complaints of GERD. She has been taken over-the-counter Prevacid for her symptoms which did not control her reflux because her insurance company at this point will not pay for Dexilant. In the past she's taken omeprazole, Zantac , and Nexium with no relief. She has had heartburn.    Review of Systems  Constitutional: Negative.   HENT: Negative.   Respiratory: Negative.        Pain with inspiration  Cardiovascular: Negative.   Gastrointestinal: Positive for abdominal pain.       Epigastric   Skin: Negative.   Psychiatric/Behavioral: Negative.    As stated above    Past Medical History  Diagnosis Date  . ANXIETY DEPRESSION 02/15/2009  . AORTIC STENOSIS 07/07/2010    a. echo 12/23/10: EF 60-65%, mod to severe AS with mean gradient 29 mmHg  . Barrett's esophagus 04/14/2007  . CAD, NATIVE VESSEL 05/17/2010    a. NSTEMI 8/11 - BMS to RCA;  b. cath 4/12: dLAD 50%, RI 80% (PCI), AVCFX 30%, pRCA 30%, stent ok, 40-50, then 50% dist RCA;  c. s/p Promus DES to RI 12/25/10  . Carotid stenosis 05/17/2010    a. dopplers 11/11: RICA 40-50%; LICA 0-39% (follow up due 11/12)  . 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 Herpes simplex without mention of complication 01/16/2010  . HYPERLIPIDEMIA, MILD 08/29/2009  . HYPERTENSION 04/14/2007  . MIGRAINE HEADACHE 04/20/2008  .  OSTEOPENIA 04/14/2007  . URINARY INCONTINENCE, STRESS, MILD 08/26/2007  . GERD (gastroesophageal reflux disease)   . Menopause   . Ovarian cyst   . Migraine   . Urine, incontinence, stress female   . Adenomatous colon polyp     History   Social History  . Marital Status: Single    Spouse Name: N/A    Number of Children: N/A  . Years of Education: N/A   Occupational History  . Not on file.   Social History Main Topics  . Smoking status: Former Games developer  . Smokeless tobacco: Not on file   Comment: quit in 2005  . Alcohol Use: No  . Drug Use: No  . Sexually Active: Yes   Other Topics Concern  . Not on file   Social History Narrative  . No narrative on file    Past Surgical History  Procedure Date  . Total abdominal hysterectomy   . Tonsillectomy and adenoidectomy   . Nasal sinus surgery     x 2  . Eye lid surgery   . Thrombosed hemorrohid lanced   . Coronary artery bypass graft     Family History  Problem Relation Age of Onset  . Coronary artery disease Other   . Heart disease Other     6 stents  . Heart disease Mother   . COPD Father   . Heart disease Father   . Heart disease  Maternal Uncle     Allergies  Allergen Reactions  . Codeine Sulfate Rash    REACTION: unspecified  . Hydrocodone-Acetaminophen Rash    REACTION: unspecified  . ZOX:[id number]+[id number]+AOZHYQMVHQ Acid+Aspartame     REACTION: unspecified    Current Outpatient Prescriptions on File Prior to Visit  Medication Sig Dispense Refill  . acyclovir (ZOVIRAX) 400 MG tablet TAKE ONE TABLET BY MOUTH THREE TIMES DAILY AS NEEDED FOR COLD SORE  30 tablet  1  . albuterol (PROVENTIL HFA) 108 (90 BASE) MCG/ACT inhaler Inhale 2 puffs into the lungs every 6 (six) hours as needed for wheezing.      Marland Kitchen aspirin 81 MG tablet Take 81 mg by mouth daily.        . clopidogrel (PLAVIX) 75 MG tablet Take 1 tablet (75 mg total) by mouth daily.  30 tablet  11  . CRESTOR 40 MG tablet TAKE ONE TABLET BY MOUTH  EVERY DAY  30 each  8  . estradiol (VIVELLE-DOT) 0.05 MG/24HR Place 1 patch (0.05 mg total) onto the skin 2 (two) times a week. Change twice a week  10 patch  11  . losartan (COZAAR) 50 MG tablet Take 1 tablet (50 mg total) by mouth daily.  90 tablet  3  . nadolol (CORGARD) 20 MG tablet TAKE ONE TABLET BY MOUTH TWICE DAILY(INCREASED 05/18/2010)  60 tablet  6  . PRISTIQ 100 MG 24 hr tablet TAKE ONE TABLET BY MOUTH EVERY DAY.  30 each  6  . SPIRIVA HANDIHALER 18 MCG inhalation capsule INHALE ONE PUFF EVERY DAY  30 each  6    BP 98/60  Pulse 72  Temp(Src) 97.8 F (36.6 C) (Oral)  Wt 145 lb (65.772 kg)chart Objective:   Physical Exam  Constitutional: She is oriented to person, place, and time. She appears well-developed and well-nourished.  HENT:  Head: Normocephalic and atraumatic.  Right Ear: External ear normal.  Left Ear: External ear normal.  Eyes: Conjunctivae are normal.  Neck: Normal range of motion. Neck supple.  Cardiovascular: Normal rate, regular rhythm and normal heart sounds.   Pulmonary/Chest: Effort normal and breath sounds normal. She has no wheezes. She has no rales.       Moderate pain with inspiration.   Abdominal: Soft. Bowel sounds are normal.  Musculoskeletal: Normal range of motion.  Neurological: She is alert and oriented to person, place, and time.  Skin: Skin is warm and dry.  Psychiatric: She has a normal mood and affect.          Assessment & Plan:  Assessment: Pleural effusion-improving per x-ray, GERD-uncontrolled  Plan: Prednisone 60 mg by mouth every morning x3 days, 40 mg by mouth every morning x3 days, 20 mg by mouth every morning x3 days. Advised to call if she has increasing pain or any shortness of breath. I have faxed over a prescription for Dexilant, 30 mg by mouth. Patient will follow up with Dr. Lovell Sheehan in one week and sooner when necessary.jj

## 2011-08-23 ENCOUNTER — Ambulatory Visit: Payer: BC Managed Care – PPO | Admitting: Family Medicine

## 2011-08-24 ENCOUNTER — Encounter (HOSPITAL_COMMUNITY)
Admission: RE | Admit: 2011-08-24 | Discharge: 2011-08-24 | Disposition: A | Discharge status: Home / Self Care | Payer: BC Managed Care – PPO | Source: Ambulatory Visit | Attending: Cardiovascular Disease | Admitting: Cardiovascular Disease

## 2011-08-27 ENCOUNTER — Encounter (HOSPITAL_COMMUNITY): Payer: BC Managed Care – PPO

## 2011-08-29 ENCOUNTER — Ambulatory Visit (INDEPENDENT_AMBULATORY_CARE_PROVIDER_SITE_OTHER): Payer: BC Managed Care – PPO | Admitting: Family Medicine

## 2011-08-29 ENCOUNTER — Encounter (HOSPITAL_COMMUNITY): Payer: BC Managed Care – PPO

## 2011-08-29 ENCOUNTER — Encounter: Payer: Self-pay | Admitting: Family Medicine

## 2011-08-29 VITALS — BP 140/80 | Temp 98.4°F | Wt 146.0 lb

## 2011-08-29 DIAGNOSIS — J90 Pleural effusion, not elsewhere classified: Secondary | ICD-10-CM

## 2011-08-29 NOTE — Progress Notes (Signed)
Subjective:    Patient ID: Katherine Foley, female    DOB: Jun 10, 1951, 60 y.o.   MRN: [id number]  HPI This 60 year old white female, patient of Dr. Lovell Sheehan in for a recheck of a left pleural effusion noted by x-ray one week ago. She has a history of a pleural effusion that has been resolving since about October. The last week she noticed pain in her left shoulder had an image done at that time the pleural effusion appeared to be the source of the pain. She has since been on prednisone, tolerating it well. She feels like she is about 80% improved at this point. She denies any chest pain, shortness of breath, or palpitations. She is due to see Dr. Lovell Sheehan on January 2013.  Review of Systems  Constitutional: Negative.   HENT: Negative.   Eyes: Negative.   Respiratory: Negative.   Cardiovascular: Negative.   Gastrointestinal: Negative.   Musculoskeletal: Negative.   Skin: Negative.   Neurological: Negative.   Psychiatric/Behavioral: Negative.        Past Medical History  Diagnosis Date  . ANXIETY DEPRESSION 02/15/2009  . AORTIC STENOSIS 07/07/2010    a. echo 12/23/10: EF 60-65%, mod to severe AS with mean gradient 29 mmHg  . Barrett's esophagus 04/14/2007  . CAD, NATIVE VESSEL 05/17/2010    a. NSTEMI 8/11 - BMS to RCA;  b. cath 4/12: dLAD 50%, RI 80% (PCI), AVCFX 30%, pRCA 30%, stent ok, 40-50, then 50% dist RCA;  c. s/p Promus DES to RI 12/25/10  . Carotid stenosis 05/17/2010    a. dopplers 11/11: RICA 40-50%; LICA 0-39% (follow up due 11/12)  . 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 Herpes simplex without mention of complication 01/16/2010  . HYPERLIPIDEMIA, MILD 08/29/2009  . HYPERTENSION 04/14/2007  . MIGRAINE HEADACHE 04/20/2008  . OSTEOPENIA 04/14/2007  . URINARY INCONTINENCE, STRESS, MILD 08/26/2007  . GERD (gastroesophageal reflux disease)   . Menopause   . Ovarian cyst   . Migraine   . Urine, incontinence, stress female   . Adenomatous colon polyp     History     Social History  . Marital Status: Single    Spouse Name: N/A    Number of Children: N/A  . Years of Education: N/A   Occupational History  . Not on file.   Social History Main Topics  . Smoking status: Former Games developer  . Smokeless tobacco: Not on file   Comment: quit in 2005  . Alcohol Use: No  . Drug Use: No  . Sexually Active: Yes   Other Topics Concern  . Not on file   Social History Narrative  . No narrative on file    Past Surgical History  Procedure Date  . Total abdominal hysterectomy   . Tonsillectomy and adenoidectomy   . Nasal sinus surgery     x 2  . Eye lid surgery   . Thrombosed hemorrohid lanced   . Coronary artery bypass graft     Family History  Problem Relation Age of Onset  . Coronary artery disease Other   . Heart disease Other     6 stents  . Heart disease Mother   . COPD Father   . Heart disease Father   . Heart disease Maternal Uncle     Allergies  Allergen Reactions  . Codeine Sulfate Rash    REACTION: unspecified  . Hydrocodone-Acetaminophen Rash    REACTION: unspecified  . NWG:NFAOZHYQMVH+QIONGEXBM+[id number] Acid+Aspartame     REACTION: unspecified  Current Outpatient Prescriptions on File Prior to Visit  Medication Sig Dispense Refill  . acyclovir (ZOVIRAX) 400 MG tablet TAKE ONE TABLET BY MOUTH THREE TIMES DAILY AS NEEDED FOR COLD SORE  30 tablet  1  . albuterol (PROVENTIL HFA) 108 (90 BASE) MCG/ACT inhaler Inhale 2 puffs into the lungs every 6 (six) hours as needed for wheezing.      Marland Kitchen aspirin 81 MG tablet Take 81 mg by mouth daily.        . clopidogrel (PLAVIX) 75 MG tablet Take 1 tablet (75 mg total) by mouth daily.  30 tablet  11  . CRESTOR 40 MG tablet TAKE ONE TABLET BY MOUTH EVERY DAY  30 each  8  . Dexlansoprazole 30 MG capsule Take 1 capsule (30 mg total) by mouth daily. Patient has tried and failed Omeprazole, Zantac, Nexium, and Prevacid. No relief. Dexilant samples help.  30 capsule  2  . estradiol  (VIVELLE-DOT) 0.05 MG/24HR Place 1 patch (0.05 mg total) onto the skin 2 (two) times a week. Change twice a week  10 patch  11  . losartan (COZAAR) 50 MG tablet Take 1 tablet (50 mg total) by mouth daily.  90 tablet  3  . nadolol (CORGARD) 20 MG tablet TAKE ONE TABLET BY MOUTH TWICE DAILY(INCREASED 05/18/2010)  60 tablet  6  . predniSONE (DELTASONE) 20 MG tablet Take 1 tablet (20 mg total) by mouth daily. 60mg  po qam x 3 days, 40mg  po qam x 3 days, 20mg  po qam x 3 days  18 tablet  0  . PRISTIQ 100 MG 24 hr tablet TAKE ONE TABLET BY MOUTH EVERY DAY.  30 each  6  . SPIRIVA HANDIHALER 18 MCG inhalation capsule INHALE ONE PUFF EVERY DAY  30 each  6    BP 140/80  Temp(Src) 98.4 F (36.9 C) (Oral)  Wt 146 lb (66.225 kg)chart Objective:   Physical Exam  Constitutional: She is oriented to person, place, and time. She appears well-developed and well-nourished.  Eyes: Pupils are equal, round, and reactive to light.  Neck: Normal range of motion. Neck supple.  Cardiovascular: Normal rate, regular rhythm and normal heart sounds.   Pulmonary/Chest: Effort normal and breath sounds normal.  Musculoskeletal: Normal range of motion.  Neurological: She is alert and oriented to person, place, and time.  Skin: Skin is warm and dry.  Psychiatric: She has a normal mood and affect.          Assessment & Plan:  Assessment: Pleural effusion-improving  Plan: Keep scheduled appointment with PCP. Patient will obtain an x-ray of her chest on 09/10/2011. Complete prednisone. Call if symptoms do not completely resolve. Recheck as scheduled

## 2011-08-31 ENCOUNTER — Encounter (HOSPITAL_COMMUNITY): Payer: BC Managed Care – PPO

## 2011-09-03 ENCOUNTER — Encounter (HOSPITAL_COMMUNITY): Payer: BC Managed Care – PPO

## 2011-09-05 ENCOUNTER — Encounter (HOSPITAL_COMMUNITY): Payer: BC Managed Care – PPO

## 2011-09-06 NOTE — Progress Notes (Addendum)
Cardiac Rehabilitation Program Progress Report   Orientation:  07/05/2011 Graduate Date:  08/24/2011 Discharge Date:   # of sessions completed: 17/21  Cardiologist: Excell Seltzer Family MD:   Class Time:  945  A.  Exercise Program:  Tolerates exercise @ 3.7 METS for 30 minutes, Bike Test Results:  Pre: 0.84 miles and Post: 0.89 miles, Improved functional capacity  6.0 %, No Change  muscular strength  0 %, Improved  flexibility 30.8 %, Decreased education score 0.4 % and Discharged to home exercise program.  Anticipated compliance:  good  B.  Mental Health:  Good mental attitude and Quality of Life (QOL)  improvements:  Overall  29.8 %, Health/Functioning 27 %, Socioeconomics 40 %, Psych/Spiritual 25.7 %, Family 31.6 %    C.  Education/Instruction/Skills  Accurately checks own pulse.  Rest:  71  Exercise:  126, Knows THR for exercise, Uses Perceived Exertion Scale and/or Dyspnea Scale and Attended 3/15 education classes  Home exercise given: 07/16/2011  D.  Nutrition/Weight Control/Body Composition:  Adherence to prescribed nutrition program: good , % Body Fat  36.3 and Patient has gained 2.5 kg  *This section completed by Mickle Plumb, Andres Shad, RD, LDN, CDE  E.  Blood Lipids Lab Results  Component Value Date   CHOL 150 06/19/2011   HDL 55.70 06/19/2011   LDLCALC 68 06/19/2011   LDLDIRECT 73.3 10/02/2010   TRIG 132.0 06/19/2011   CHOLHDL 3 06/19/2011    Lab Results  Component Value Date   CHOL 150 06/19/2011   F.  Lifestyle Changes:  Making positive lifestyle changes  G.  Symptoms noted with exercise:  Asymptomatic  Report Completed By:  Hazle Nordmann   Comments:  Pt did well in program progressing from 3.3 METs to 3.6 METs while here.  Pt graduated early since she is returning to work.  Pt plans to continue by walking at home.  At d/c pt rhythm was sinus.  Thanks for the referral. Fabio Pierce, MA, ACSM RCEP

## 2011-09-07 ENCOUNTER — Encounter (HOSPITAL_COMMUNITY): Payer: BC Managed Care – PPO

## 2011-09-10 ENCOUNTER — Encounter (HOSPITAL_COMMUNITY): Payer: BC Managed Care – PPO

## 2011-09-11 ENCOUNTER — Encounter: Payer: Self-pay | Admitting: Internal Medicine

## 2011-09-11 ENCOUNTER — Ambulatory Visit (INDEPENDENT_AMBULATORY_CARE_PROVIDER_SITE_OTHER): Payer: BC Managed Care – PPO | Admitting: Internal Medicine

## 2011-09-11 VITALS — BP 138/82 | HR 72 | Temp 97.8°F | Resp 16 | Ht 62.5 in | Wt 144.0 lb

## 2011-09-11 DIAGNOSIS — K219 Gastro-esophageal reflux disease without esophagitis: Secondary | ICD-10-CM

## 2011-09-11 DIAGNOSIS — J449 Chronic obstructive pulmonary disease, unspecified: Secondary | ICD-10-CM

## 2011-09-11 DIAGNOSIS — J441 Chronic obstructive pulmonary disease with (acute) exacerbation: Secondary | ICD-10-CM

## 2011-09-11 MED ORDER — ESOMEPRAZOLE MAGNESIUM 40 MG PO CPDR: 40.0000 mg | DELAYED_RELEASE_CAPSULE | Freq: Every day | ORAL | Status: DC

## 2011-09-11 MED ORDER — ACLIDINIUM BROMIDE 400 MCG/ACT IN AEPB: 1.0000 | INHALATION_SPRAY | Freq: Two times a day (BID) | RESPIRATORY_TRACT | Status: DC

## 2011-09-11 NOTE — Progress Notes (Signed)
  Subjective:    Patient ID: Katherine Foley, female    DOB: 1950-09-27, 61 y.o.   MRN: [id number]  HPI  monitoring of blood pressure, GERD, COPD Her insurance does not cover the dexilant Using the spiriva daily but has been using the albuterol more often ( daily) The pt has been on symbicort and advair in the past but feels that spiriva worked best  Review of Systems  Constitutional: Negative for activity change, appetite change and fatigue.  HENT: Negative for ear pain, congestion, neck pain, postnasal drip and sinus pressure.   Eyes: Negative for redness and visual disturbance.  Respiratory: Negative for cough, shortness of breath and wheezing.   Gastrointestinal: Negative for abdominal pain and abdominal distention.  Genitourinary: Negative for dysuria, frequency and menstrual problem.  Musculoskeletal: Negative for myalgias, joint swelling and arthralgias.  Skin: Negative for rash and wound.  Neurological: Negative for dizziness, weakness and headaches.  Hematological: Negative for adenopathy. Does not bruise/bleed easily.  Psychiatric/Behavioral: Negative for sleep disturbance and decreased concentration.       Objective:   Physical Exam  Nursing note and vitals reviewed. Constitutional: She is oriented to person, place, and time. She appears well-developed and well-nourished. No distress.  HENT:  Head: Normocephalic and atraumatic.  Right Ear: External ear normal.  Left Ear: External ear normal.  Nose: Nose normal.  Mouth/Throat: Oropharynx is clear and moist.  Eyes: Conjunctivae and EOM are normal. Pupils are equal, round, and reactive to light.  Neck: Normal range of motion. Neck supple. No JVD present. No tracheal deviation present. No thyromegaly present.  Cardiovascular: Normal rate, regular rhythm, normal heart sounds and intact distal pulses.   No murmur heard. Pulmonary/Chest: Effort normal. No respiratory distress. She has no wheezes. She has no rales. She exhibits  no tenderness.       Increased bronchial breath sounds  Abdominal: Soft. Bowel sounds are normal.  Musculoskeletal: Normal range of motion. She exhibits no edema and no tenderness.  Lymphadenopathy:    She has no cervical adenopathy.  Neurological: She is alert and oriented to person, place, and time. She has normal reflexes. No cranial nerve deficit.  Skin: Skin is warm and dry. She is not diaphoretic.  Psychiatric: She has a normal mood and affect. Her behavior is normal.          Assessment & Plan:  Poorly controlled COPD on current maintenance drug Give her a trial of a new twice a day medication samples of the medication were given as well as careful instructions of much use and what to expect the patient will keep a record of her shortness of breath a record of the use of her rescue inhaler and understand a long-acting agent does not have the "kick" of the albuterol and she should not expect to sense an immediate relief of shortness of breath upon the use of a long-acting agent with more of a sustained control shortness of breath.  Her symptoms are largely at night and we asked her to monitor for snoring the next step may be a sleep study.

## 2011-09-11 NOTE — Patient Instructions (Signed)
The patient is instructed to continue all medications as prescribed. Schedule followup with check out clerk upon leaving the clinic  

## 2011-09-12 ENCOUNTER — Encounter (HOSPITAL_COMMUNITY): Payer: BC Managed Care – PPO

## 2011-09-14 ENCOUNTER — Encounter (HOSPITAL_COMMUNITY): Payer: BC Managed Care – PPO

## 2011-09-17 ENCOUNTER — Encounter (HOSPITAL_COMMUNITY): Payer: BC Managed Care – PPO

## 2011-09-19 ENCOUNTER — Encounter (HOSPITAL_COMMUNITY): Payer: BC Managed Care – PPO

## 2011-09-21 ENCOUNTER — Encounter (HOSPITAL_COMMUNITY): Payer: BC Managed Care – PPO

## 2011-09-24 ENCOUNTER — Encounter (HOSPITAL_COMMUNITY): Payer: BC Managed Care – PPO

## 2011-09-25 ENCOUNTER — Telehealth: Payer: Self-pay | Admitting: Internal Medicine

## 2011-09-25 MED ORDER — DOXYCYCLINE HYCLATE 100 MG PO TABS: 100.0000 mg | ORAL_TABLET | Freq: Two times a day (BID) | ORAL | Status: AC

## 2011-09-25 NOTE — Telephone Encounter (Signed)
Call in doxycycline 100 bid for 10 days

## 2011-09-25 NOTE — Telephone Encounter (Signed)
Pt has sinus inf requesting abx call into [facility]. Pt decline ov

## 2011-09-26 ENCOUNTER — Encounter (HOSPITAL_COMMUNITY): Payer: BC Managed Care – PPO

## 2011-09-27 ENCOUNTER — Encounter (HOSPITAL_COMMUNITY): Payer: Self-pay | Admitting: *Deleted

## 2011-09-27 NOTE — Progress Notes (Signed)
Agree with Cardiac Rehab progress summary report written on 09/06/2011.  Thank you for the referral of your nice patient.

## 2011-09-28 ENCOUNTER — Encounter (HOSPITAL_COMMUNITY): Payer: BC Managed Care – PPO

## 2011-10-01 ENCOUNTER — Encounter (HOSPITAL_COMMUNITY): Payer: BC Managed Care – PPO

## 2011-10-03 ENCOUNTER — Encounter (HOSPITAL_COMMUNITY): Payer: BC Managed Care – PPO

## 2011-10-05 ENCOUNTER — Encounter (HOSPITAL_COMMUNITY): Payer: BC Managed Care – PPO

## 2011-10-05 IMAGING — CR DG CHEST 2V
2 series · 2 of 2 positions shown · non-contrast
Comparison: 05/21/2011

CLINICAL DATA: Thoracentesis

CHEST - 2 VIEW

[w chest pa]
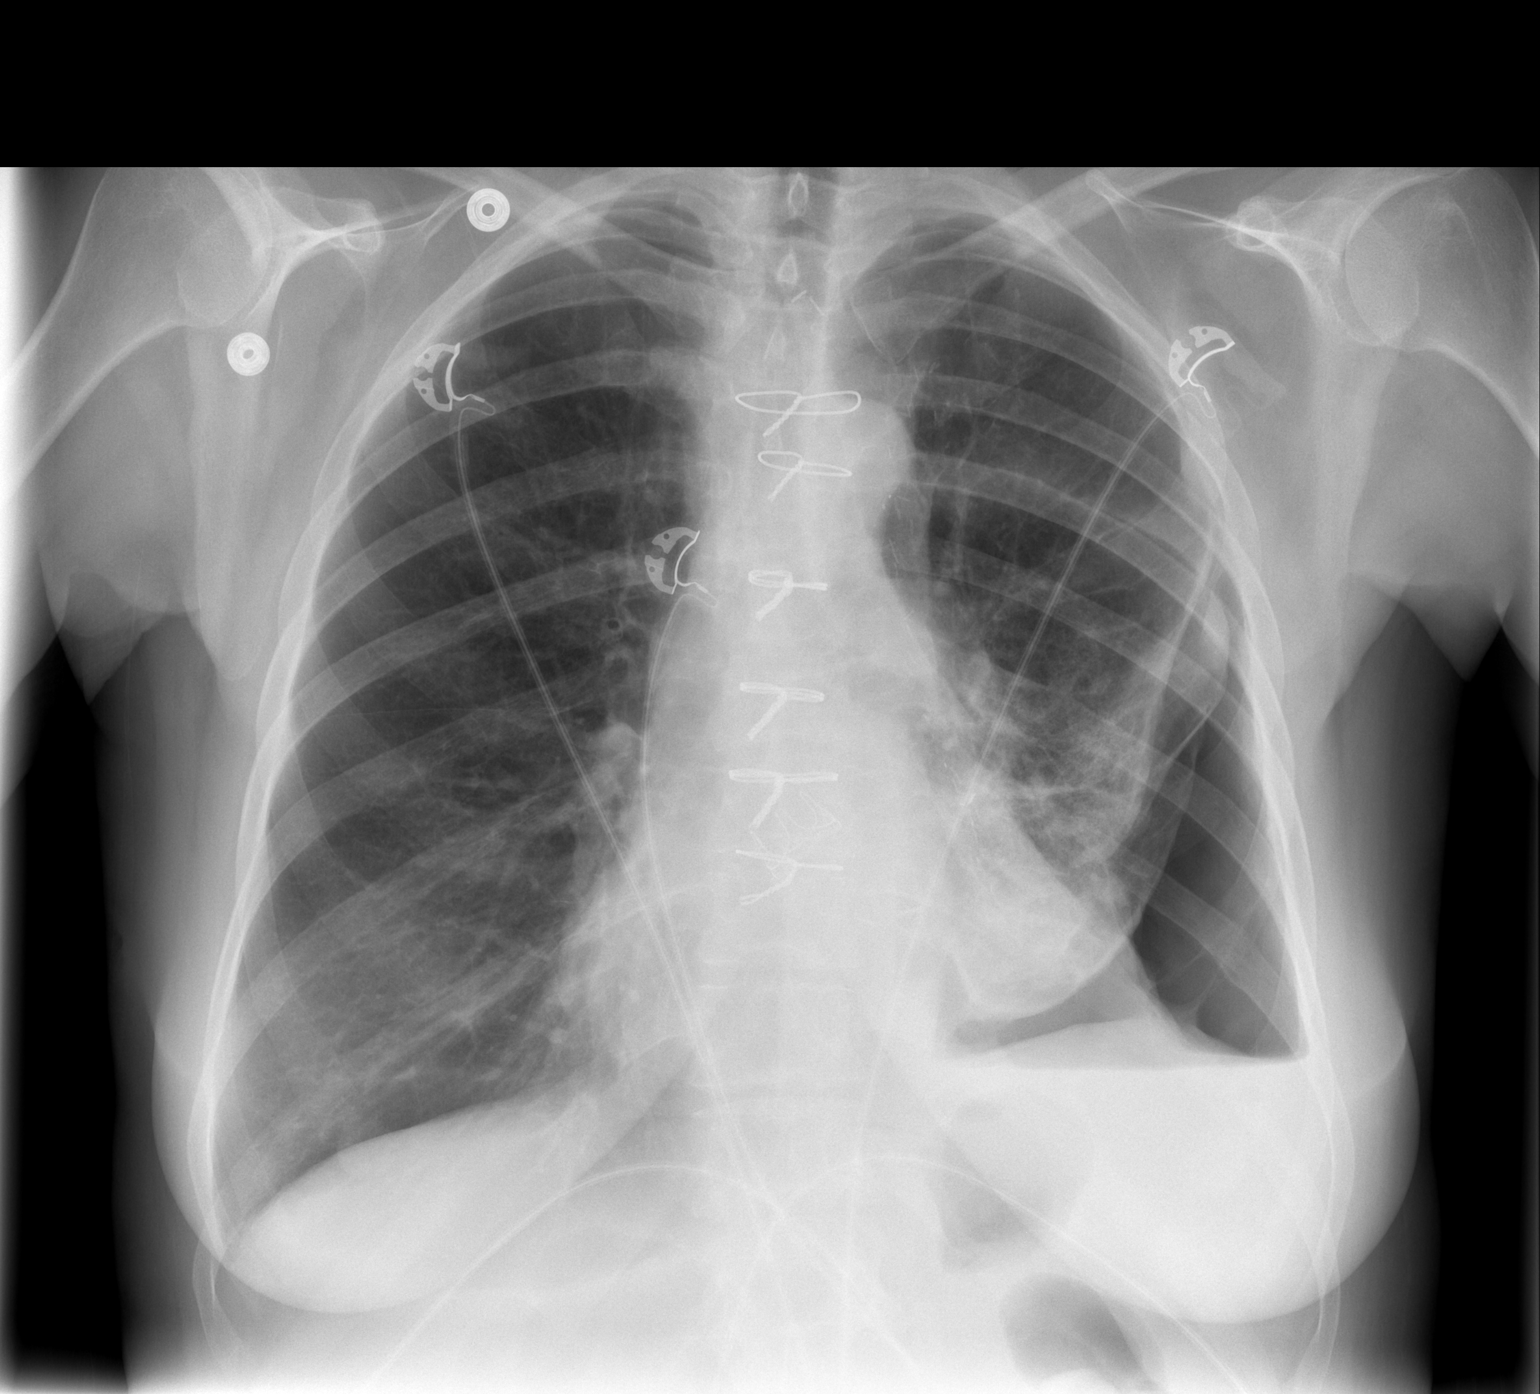

[w chest lat]
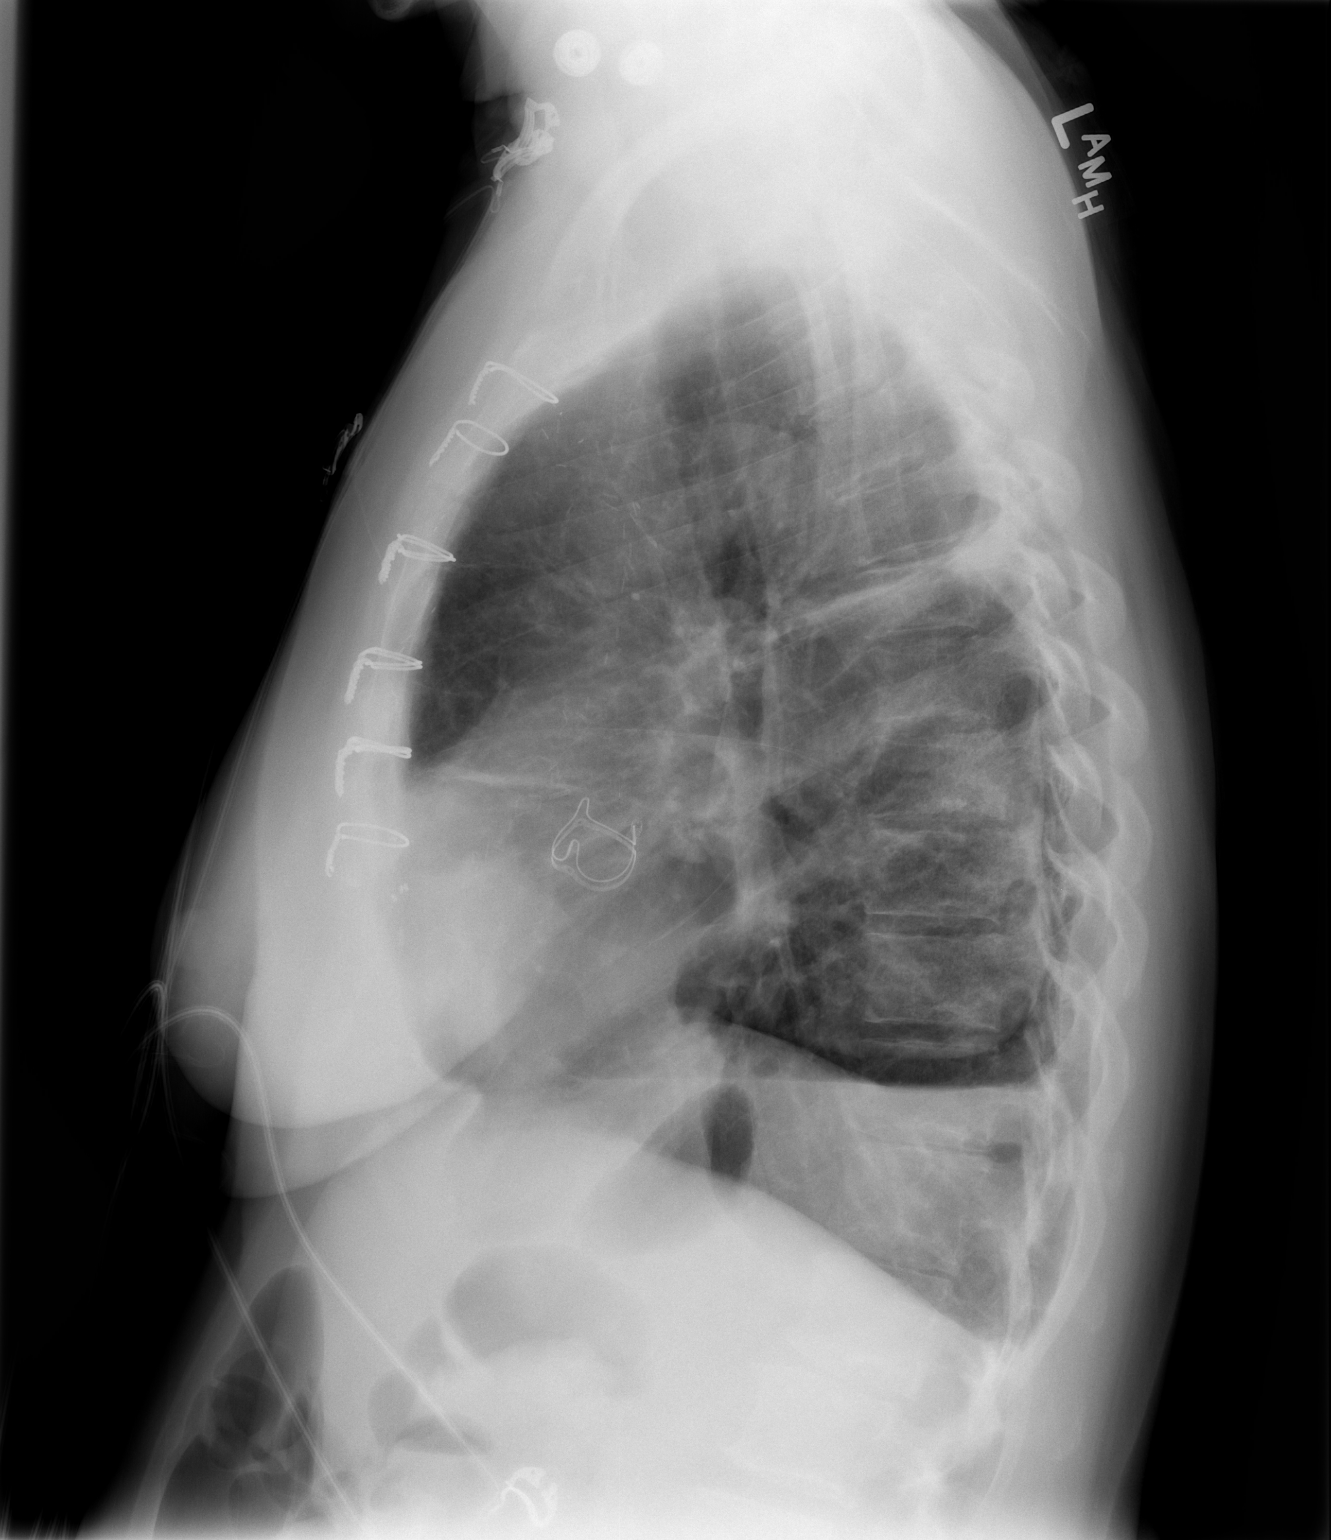

[2 of 2 positions shown; findings below may reference images not displayed]

FINDINGS: The left pleural effusion has markedly reduced in size
and there is improved aeration in the left lung.  Opacities at the
left base are noted.  There is, however a 20% left pneumothorax.
IMPRESSION: 20% left pneumothorax after left thoracentesis.  The lung is
improved in aeration compared to the pre thoracentesis image and
restriction of expansion of the lung is likely the cause of the
pneumothorax.

## 2011-10-08 ENCOUNTER — Encounter (HOSPITAL_COMMUNITY): Payer: BC Managed Care – PPO

## 2011-10-10 ENCOUNTER — Encounter (HOSPITAL_COMMUNITY): Payer: BC Managed Care – PPO

## 2011-10-11 IMAGING — CR DG CHEST 2V
2 series · 2 of 2 positions shown · non-contrast
Comparison: 05/27/2011

CLINICAL DATA: VATS.

CHEST - 2 VIEW

[w chest pa]
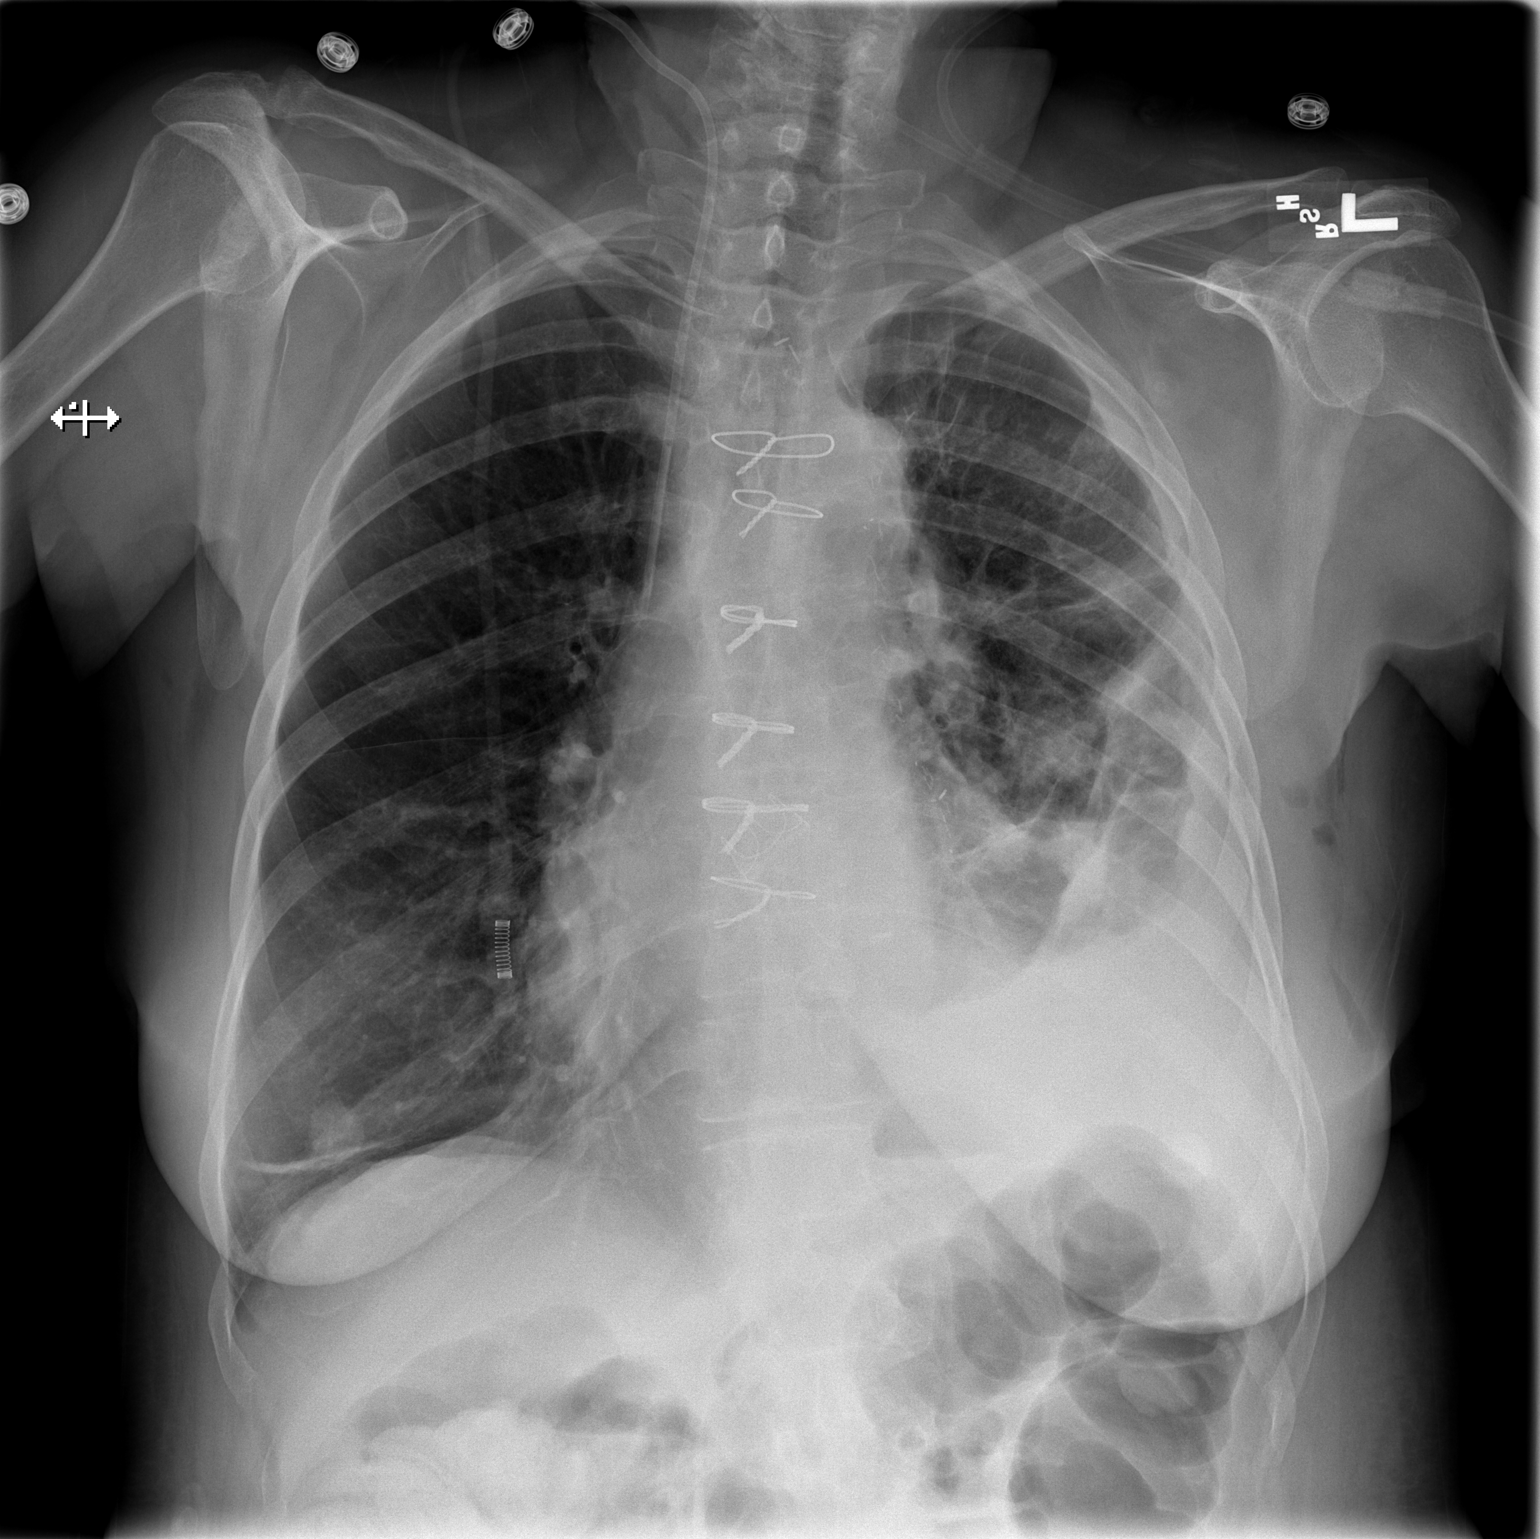

[w chest lat]
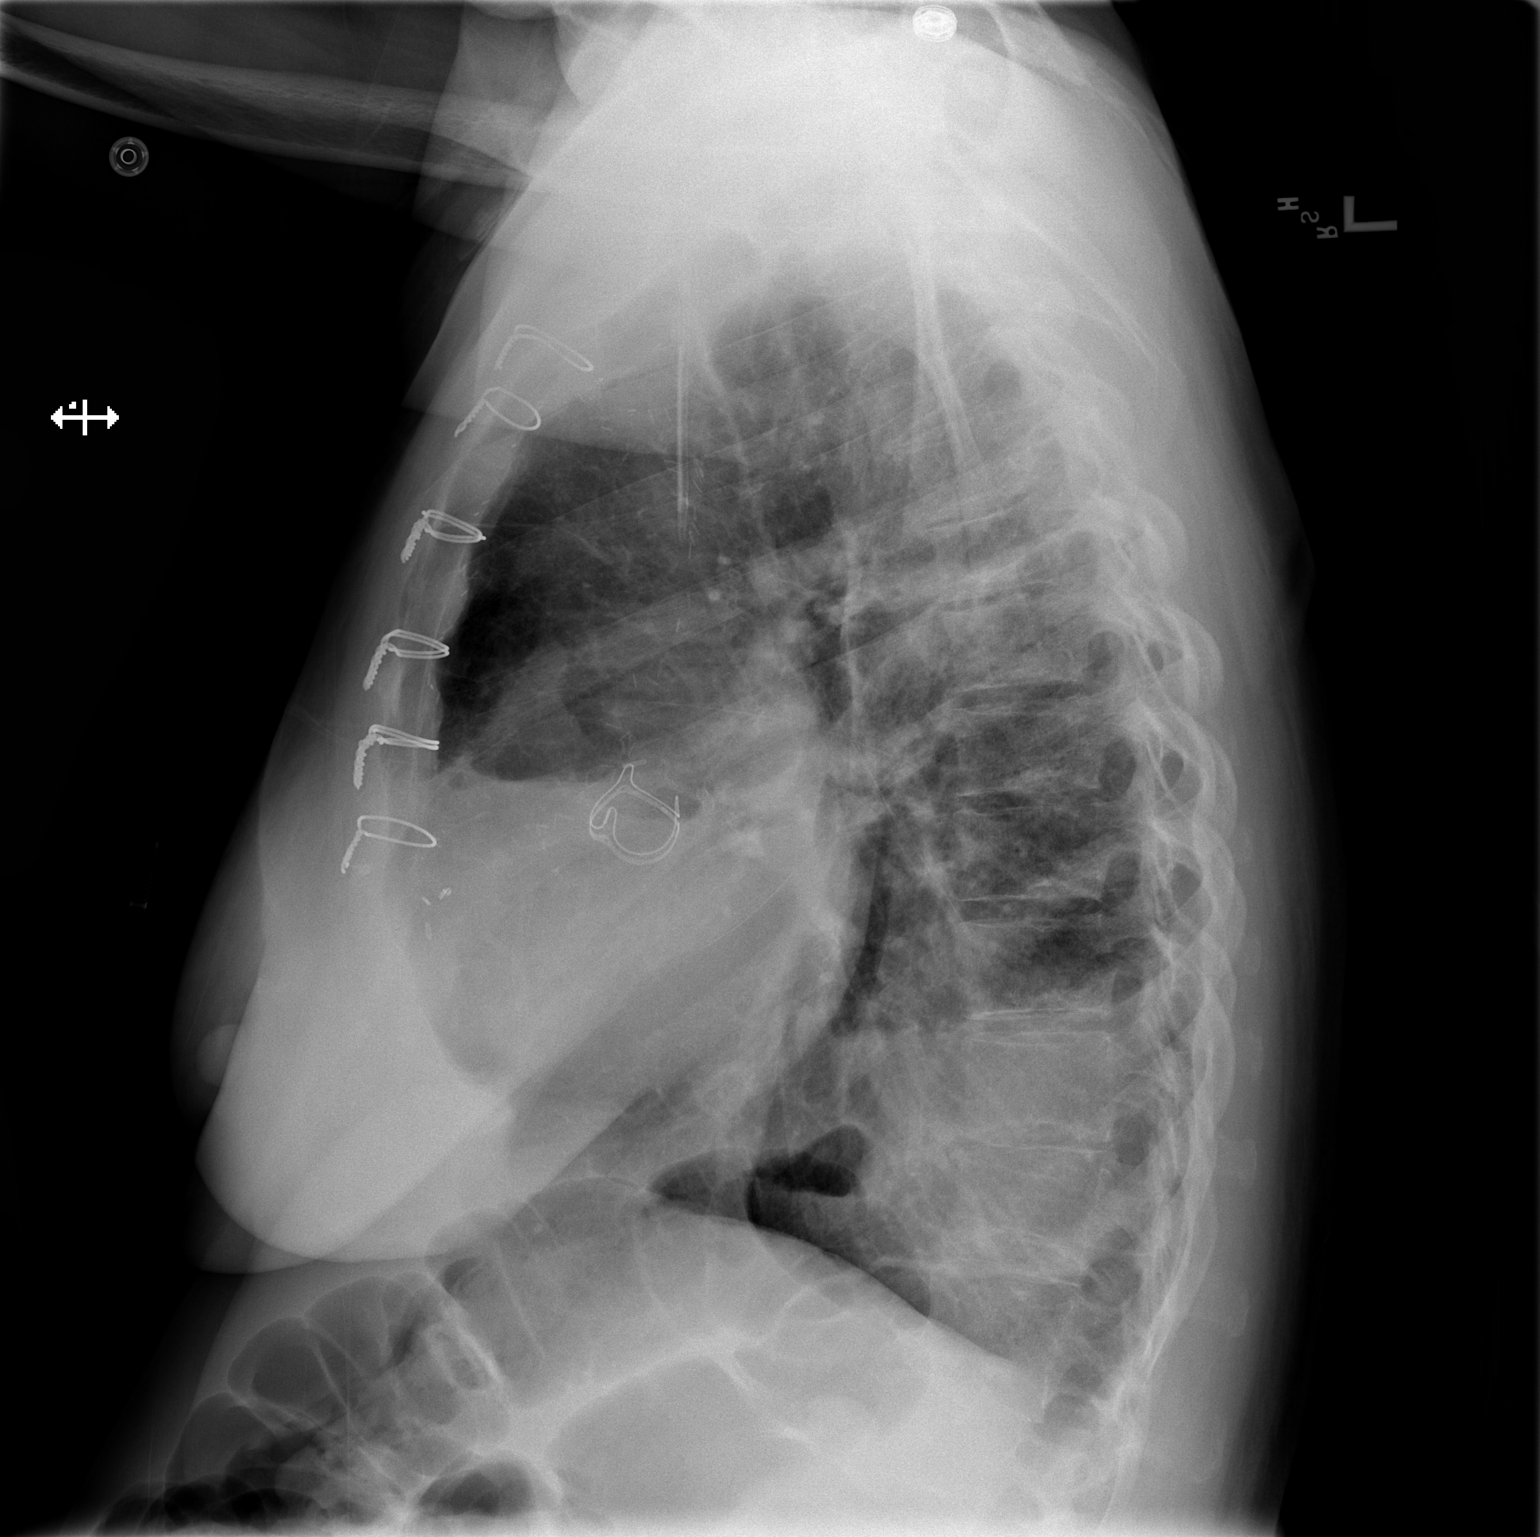

[2 of 2 positions shown; findings below may reference images not displayed]

FINDINGS: Right central line remains in place, unchanged.
Underlying COPD.  Stable left lower lobe airspace opacity and left
effusion.  Minimal right base atelectasis.  Prior median sternotomy
and valve replacement/CABG.  No pneumothorax.
IMPRESSION: No significant change.

## 2011-10-12 ENCOUNTER — Encounter (HOSPITAL_COMMUNITY): Payer: BC Managed Care – PPO

## 2011-10-15 ENCOUNTER — Encounter (HOSPITAL_COMMUNITY): Payer: BC Managed Care – PPO

## 2011-11-09 ENCOUNTER — Encounter: Payer: Self-pay | Admitting: Internal Medicine

## 2011-11-09 ENCOUNTER — Ambulatory Visit (INDEPENDENT_AMBULATORY_CARE_PROVIDER_SITE_OTHER): Payer: BC Managed Care – PPO | Admitting: Internal Medicine

## 2011-11-09 VITALS — BP 132/80 | HR 72 | Temp 98.3°F | Resp 16 | Ht 62.5 in | Wt 144.0 lb

## 2011-11-09 DIAGNOSIS — J4489 Other specified chronic obstructive pulmonary disease: Secondary | ICD-10-CM

## 2011-11-09 DIAGNOSIS — I359 Nonrheumatic aortic valve disorder, unspecified: Secondary | ICD-10-CM

## 2011-11-09 DIAGNOSIS — I10 Essential (primary) hypertension: Secondary | ICD-10-CM

## 2011-11-09 DIAGNOSIS — J449 Chronic obstructive pulmonary disease, unspecified: Secondary | ICD-10-CM

## 2011-11-09 DIAGNOSIS — K2270 Barrett's esophagus without dysplasia: Secondary | ICD-10-CM

## 2011-11-09 MED ORDER — LOSARTAN POTASSIUM 50 MG PO TABS: 50.0000 mg | ORAL_TABLET | Freq: Two times a day (BID) | ORAL | Status: DC

## 2011-11-09 MED ORDER — TIOTROPIUM BROMIDE MONOHYDRATE 18 MCG IN CAPS: 18.0000 ug | ORAL_CAPSULE | Freq: Every day | RESPIRATORY_TRACT | Status: DC

## 2011-11-09 NOTE — Progress Notes (Signed)
  Subjective:    Patient ID: Katherine Foley, female    DOB: 1951-01-07, 61 y.o.   MRN: [id number]  HPI  Pt did not respond to the new inhaler CAD with mild increased in blood pressure and a sense of exertional chest tightness GERD stable No nausea, diaphoresis associated with the chest pressure   Review of Systems     Objective:   Physical Exam        Assessment & Plan:

## 2012-01-01 ENCOUNTER — Ambulatory Visit (INDEPENDENT_AMBULATORY_CARE_PROVIDER_SITE_OTHER): Payer: BC Managed Care – PPO | Admitting: Internal Medicine

## 2012-01-01 ENCOUNTER — Encounter: Payer: Self-pay | Admitting: Internal Medicine

## 2012-01-01 VITALS — BP 118/74 | HR 72 | Temp 98.6°F | Resp 16 | Ht 62.5 in | Wt 144.0 lb

## 2012-01-01 DIAGNOSIS — T887XXA Unspecified adverse effect of drug or medicament, initial encounter: Secondary | ICD-10-CM

## 2012-01-01 DIAGNOSIS — R5381 Other malaise: Secondary | ICD-10-CM

## 2012-01-01 DIAGNOSIS — I10 Essential (primary) hypertension: Secondary | ICD-10-CM

## 2012-01-01 DIAGNOSIS — R5383 Other fatigue: Secondary | ICD-10-CM

## 2012-01-01 DIAGNOSIS — E785 Hyperlipidemia, unspecified: Secondary | ICD-10-CM

## 2012-01-01 LAB — CBC WITH DIFFERENTIAL/PLATELET
Basophils Absolute: 0 10*3/uL (ref 0.0–0.1)
Eosinophils Absolute: 0 10*3/uL (ref 0.0–0.7)
Hemoglobin: 12.4 g/dL (ref 12.0–15.0)
Lymphocytes Relative: 23.8 % (ref 12.0–46.0)
MCHC: 32.4 g/dL (ref 30.0–36.0)
Monocytes Relative: 11.5 % (ref 3.0–12.0)
Neutro Abs: 3.8 10*3/uL (ref 1.4–7.7)
Neutrophils Relative %: 64.4 % (ref 43.0–77.0)
Platelets: 152 10*3/uL (ref 150.0–400.0)
RDW: 15.3 % — ABNORMAL HIGH (ref 11.5–14.6)

## 2012-01-01 NOTE — Progress Notes (Signed)
Subjective:    Patient ID: Katherine Foley, female    DOB: April 19, 1951, 61 y.o.   MRN: [id number]  HPI  The patient is a 61 year old white female who follows for hyperlipidemia hypertension history of recent coronary artery disease and gastroesophageal reflux her primary complaint today is extreme fatigue she states that since she returned to work she feels tired all the time.  She denies any dark stools and he had hematochezia she has no increased shortness of breath or chest pain.   Review of Systems  Constitutional: Negative for activity change, appetite change and fatigue.  HENT: Negative for ear pain, congestion, neck pain, postnasal drip and sinus pressure.   Eyes: Negative for redness and visual disturbance.  Respiratory: Negative for cough, shortness of breath and wheezing.   Gastrointestinal: Negative for abdominal pain and abdominal distention.  Genitourinary: Negative for dysuria, frequency and menstrual problem.  Musculoskeletal: Negative for myalgias, joint swelling and arthralgias.  Skin: Negative for rash and wound.  Neurological: Negative for dizziness, weakness and headaches.  Hematological: Negative for adenopathy. Does not bruise/bleed easily.  Psychiatric/Behavioral: Negative for sleep disturbance and decreased concentration.   Past Medical History  Diagnosis Date  . ANXIETY DEPRESSION 02/15/2009  . AORTIC STENOSIS 07/07/2010    a. echo 12/23/10: EF 60-65%, mod to severe AS with mean gradient 29 mmHg  . Barrett's esophagus 04/14/2007  . CAD, NATIVE VESSEL 05/17/2010    a. NSTEMI 8/11 - BMS to RCA;  b. cath 4/12: dLAD 50%, RI 80% (PCI), AVCFX 30%, pRCA 30%, stent ok, 40-50, then 50% dist RCA;  c. s/p Promus DES to RI 12/25/10  . Carotid stenosis 05/17/2010    a. dopplers 11/11: RICA 40-50%; LICA 0-39% (follow up due 11/12)  . 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 Herpes simplex without mention of complication 01/16/2010  . HYPERLIPIDEMIA, MILD 08/29/2009    . HYPERTENSION 04/14/2007  . MIGRAINE HEADACHE 04/20/2008  . OSTEOPENIA 04/14/2007  . URINARY INCONTINENCE, STRESS, MILD 08/26/2007  . GERD (gastroesophageal reflux disease)   . Menopause   . Ovarian cyst   . Migraine   . Urine, incontinence, stress female   . Adenomatous colon polyp     History   Social History  . Marital Status: Single    Spouse Name: N/A    Number of Children: N/A  . Years of Education: N/A   Occupational History  . Not on file.   Social History Main Topics  . Smoking status: Former Games developer  . Smokeless tobacco: Not on file   Comment: quit in 2005  . Alcohol Use: No  . Drug Use: No  . Sexually Active: Yes   Other Topics Concern  . Not on file   Social History Narrative  . No narrative on file    Past Surgical History  Procedure Date  . Total abdominal hysterectomy   . Tonsillectomy and adenoidectomy   . Nasal sinus surgery     x 2  . Eye lid surgery   . Thrombosed hemorrohid lanced   . Coronary artery bypass graft     Family History  Problem Relation Age of Onset  . Coronary artery disease Other   . Heart disease Other     6 stents  . Heart disease Mother   . COPD Father   . Heart disease Father   . Heart disease Maternal Uncle     Allergies  Allergen Reactions  . Codeine Sulfate Rash    REACTION: unspecified  .  Hydrocodone-Acetaminophen Rash    REACTION: unspecified  . Amoxicillin-Pot Clavulanate     REACTION: unspecified    Current Outpatient Prescriptions on File Prior to Visit  Medication Sig Dispense Refill  . acyclovir (ZOVIRAX) 400 MG tablet TAKE ONE TABLET BY MOUTH THREE TIMES DAILY AS NEEDED FOR COLD SORE  30 tablet  1  . albuterol (PROVENTIL HFA) 108 (90 BASE) MCG/ACT inhaler Inhale 2 puffs into the lungs every 6 (six) hours as needed for wheezing.      Marland Kitchen aspirin 81 MG tablet Take 81 mg by mouth daily.        . CRESTOR 40 MG tablet TAKE ONE TABLET BY MOUTH EVERY DAY  30 each  8  . esomeprazole (NEXIUM) 40 MG  capsule Take 1 capsule (40 mg total) by mouth daily.  30 capsule  3  . estradiol (VIVELLE-DOT) 0.05 MG/24HR Place 1 patch (0.05 mg total) onto the skin 2 (two) times a week. Change twice a week  10 patch  11  . losartan (COZAAR) 50 MG tablet Take 1 tablet (50 mg total) by mouth 2 (two) times daily.  180 tablet  3  . nadolol (CORGARD) 20 MG tablet TAKE ONE TABLET BY MOUTH TWICE DAILY(INCREASED 05/18/2010)  60 tablet  6  . PRISTIQ 100 MG 24 hr tablet TAKE ONE TABLET BY MOUTH EVERY DAY.  30 each  6  . tiotropium (SPIRIVA HANDIHALER) 18 MCG inhalation capsule Place 1 capsule (18 mcg total) into inhaler and inhale daily.      Marland Kitchen DISCONTD: Aclidinium Bromide (TUDORZA PRESSAIR) 400 MCG/ACT AEPB Inhale 1 puff into the lungs 2 (two) times daily.  1 each  11    BP 118/74  Pulse 72  Temp 98.6 F (37 C)  Resp 16  Ht 5' 2.5" (1.588 m)  Wt 144 lb (65.318 kg)  BMI 25.92 kg/m2       Objective:   Physical Exam  Nursing note and vitals reviewed. Constitutional: She is oriented to person, place, and time. She appears well-developed and well-nourished. No distress.  HENT:  Head: Normocephalic and atraumatic.  Right Ear: External ear normal.  Left Ear: External ear normal.  Nose: Nose normal.  Mouth/Throat: Oropharynx is clear and moist.  Eyes: Conjunctivae and EOM are normal. Pupils are equal, round, and reactive to light.  Neck: Normal range of motion. Neck supple. No JVD present. No tracheal deviation present. No thyromegaly present.  Cardiovascular: Normal rate, regular rhythm, normal heart sounds and intact distal pulses.   No murmur heard. Pulmonary/Chest: Effort normal and breath sounds normal. She has no wheezes. She exhibits no tenderness.  Abdominal: Soft. Bowel sounds are normal.  Musculoskeletal: Normal range of motion. She exhibits no edema and no tenderness.  Lymphadenopathy:    She has no cervical adenopathy.  Neurological: She is alert and oriented to person, place, and time. She  has normal reflexes. No cranial nerve deficit.  Skin: Skin is warm and dry. She is not diaphoretic.  Psychiatric: She has a normal mood and affect. Her behavior is normal.          Assessment & Plan:  Patient has controlled hypertension we'll monitor lipid and liver today.  We'll also check a TSH and a CBC due to her fatigue I believe the etiology of her fatigue is largely to her lack of exercise during cardiac rehabilitation for exercise on regular basis 3 times a week and now she is just "working" she has developed progressive fatigue we will limited some  other physiologic cause is but I think exercises acute component and we have asked her to begin a regular exercise program walking at least 3 times a week 20 minutes at a minimum starting effort.  We'll follow her up in several months x1 for appropriate blood work today

## 2012-01-01 NOTE — Patient Instructions (Addendum)
The patient is instructed to continue all medications as prescribed. Schedule followup with check out clerk upon leaving the clinic   Need to exercise   20 min walk 4 times a week

## 2012-01-02 LAB — BASIC METABOLIC PANEL
BUN: 13 mg/dL (ref 6–23)
Calcium: 8.9 mg/dL (ref 8.4–10.5)
Chloride: 100 mEq/L (ref 96–112)
Creatinine, Ser: 0.5 mg/dL (ref 0.4–1.2)
GFR: 121.3 mL/min (ref >60.00)

## 2012-01-02 LAB — LIPID PANEL
HDL: 52.6 mg/dL (ref >39.00)
LDL Cholesterol: 52 mg/dL (ref 0–99)
Total CHOL/HDL Ratio: 3
Triglycerides: 175 mg/dL — ABNORMAL HIGH (ref 0.0–149.0)
VLDL: 35 mg/dL (ref 0.0–40.0)

## 2012-01-02 LAB — HEPATIC FUNCTION PANEL
Bilirubin, Direct: 0 mg/dL (ref 0.0–0.3)
Total Bilirubin: 0.6 mg/dL (ref 0.3–1.2)

## 2012-01-02 LAB — TSH: TSH: 0.48 u[IU]/mL (ref 0.35–5.50)

## 2012-01-21 ENCOUNTER — Other Ambulatory Visit: Payer: Self-pay | Admitting: Internal Medicine

## 2012-02-20 ENCOUNTER — Other Ambulatory Visit (HOSPITAL_COMMUNITY): Payer: Self-pay | Admitting: Internal Medicine

## 2012-02-20 ENCOUNTER — Other Ambulatory Visit: Payer: Self-pay | Admitting: *Deleted

## 2012-02-20 MED ORDER — DESVENLAFAXINE SUCCINATE ER 100 MG PO TB24: 100.0000 mg | ORAL_TABLET | Freq: Every day | ORAL | Status: DC

## 2012-02-25 ENCOUNTER — Other Ambulatory Visit: Payer: Self-pay | Admitting: *Deleted

## 2012-02-25 MED ORDER — DESVENLAFAXINE SUCCINATE ER 100 MG PO TB24: 100.0000 mg | ORAL_TABLET | Freq: Every day | ORAL | Status: DC

## 2012-03-21 ENCOUNTER — Other Ambulatory Visit: Payer: Self-pay | Admitting: Internal Medicine

## 2012-04-01 ENCOUNTER — Other Ambulatory Visit: Payer: Self-pay | Admitting: *Deleted

## 2012-04-01 ENCOUNTER — Encounter: Payer: Self-pay | Admitting: Internal Medicine

## 2012-04-01 ENCOUNTER — Ambulatory Visit (INDEPENDENT_AMBULATORY_CARE_PROVIDER_SITE_OTHER)
Admission: RE | Admit: 2012-04-01 | Discharge: 2012-04-01 | Disposition: A | Discharge status: Home / Self Care | Payer: BC Managed Care – PPO | Source: Ambulatory Visit | Attending: Internal Medicine | Admitting: Internal Medicine

## 2012-04-01 ENCOUNTER — Ambulatory Visit (INDEPENDENT_AMBULATORY_CARE_PROVIDER_SITE_OTHER): Payer: BC Managed Care – PPO | Admitting: Internal Medicine

## 2012-04-01 VITALS — BP 120/80 | HR 68 | Temp 98.2°F | Resp 14 | Ht 62.5 in | Wt 143.0 lb

## 2012-04-01 DIAGNOSIS — R059 Cough, unspecified: Secondary | ICD-10-CM

## 2012-04-01 DIAGNOSIS — I10 Essential (primary) hypertension: Secondary | ICD-10-CM

## 2012-04-01 DIAGNOSIS — E785 Hyperlipidemia, unspecified: Secondary | ICD-10-CM

## 2012-04-01 DIAGNOSIS — R05 Cough: Secondary | ICD-10-CM

## 2012-04-01 DIAGNOSIS — J209 Acute bronchitis, unspecified: Secondary | ICD-10-CM

## 2012-04-01 DIAGNOSIS — J440 Chronic obstructive pulmonary disease with acute lower respiratory infection: Secondary | ICD-10-CM

## 2012-04-01 MED ORDER — AZITHROMYCIN 250 MG PO TABS: ORAL_TABLET | ORAL | Status: DC

## 2012-04-01 NOTE — Progress Notes (Signed)
Subjective:    Patient ID: Katherine Foley, female    DOB: 06/11/1951, 61 y.o.   MRN: [id number]  HPI  monitoring blood pressure and CV risks On crestor for lipid management Depression with crestor COPD on spiriva with spells of cough and chest pain ( no swelling in feet ) Has noted wheezing and moderated productive cough x 1 week   Review of Systems  Constitutional: Negative for activity change, appetite change and fatigue.  HENT: Negative for ear pain, congestion, neck pain, postnasal drip and sinus pressure.   Eyes: Negative for redness and visual disturbance.  Respiratory: Negative for cough, shortness of breath and wheezing.   Gastrointestinal: Negative for abdominal pain and abdominal distention.  Genitourinary: Negative for dysuria, frequency and menstrual problem.  Musculoskeletal: Negative for myalgias, joint swelling and arthralgias.  Skin: Negative for rash and wound.  Neurological: Negative for dizziness, weakness and headaches.  Hematological: Negative for adenopathy. Does not bruise/bleed easily.  Psychiatric/Behavioral: Negative for disturbed wake/sleep cycle and decreased concentration.   Past Medical History  Diagnosis Date  . ANXIETY DEPRESSION 02/15/2009  . AORTIC STENOSIS 07/07/2010    a. echo 12/23/10: EF 60-65%, mod to severe AS with mean gradient 29 mmHg  . Barrett's esophagus 04/14/2007  . CAD, NATIVE VESSEL 05/17/2010    a. NSTEMI 8/11 - BMS to RCA;  b. cath 4/12: dLAD 50%, RI 80% (PCI), AVCFX 30%, pRCA 30%, stent ok, 40-50, then 50% dist RCA;  c. s/p Promus DES to RI 12/25/10  . Carotid stenosis 05/17/2010    a. dopplers 11/11: RICA 40-50%; LICA 0-39% (follow up due 11/12)  . 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 Herpes simplex without mention of complication 01/16/2010  . HYPERLIPIDEMIA, MILD 08/29/2009  . HYPERTENSION 04/14/2007  . MIGRAINE HEADACHE 04/20/2008  . OSTEOPENIA 04/14/2007  . URINARY INCONTINENCE, STRESS, MILD 08/26/2007  . GERD  (gastroesophageal reflux disease)   . Menopause   . Ovarian cyst   . Migraine   . Urine, incontinence, stress female   . Adenomatous colon polyp     History   Social History  . Marital Status: Single    Spouse Name: N/A    Number of Children: N/A  . Years of Education: N/A   Occupational History  . Not on file.   Social History Main Topics  . Smoking status: Former Games developer  . Smokeless tobacco: Not on file   Comment: quit in 2005  . Alcohol Use: No  . Drug Use: No  . Sexually Active: Yes   Other Topics Concern  . Not on file   Social History Narrative  . No narrative on file    Past Surgical History  Procedure Date  . Total abdominal hysterectomy   . Tonsillectomy and adenoidectomy   . Nasal sinus surgery     x 2  . Eye lid surgery   . Thrombosed hemorrohid lanced   . Coronary artery bypass graft     Family History  Problem Relation Age of Onset  . Coronary artery disease Other   . Heart disease Other     6 stents  . Heart disease Mother   . COPD Father   . Heart disease Father   . Heart disease Maternal Uncle     Allergies  Allergen Reactions  . Codeine Sulfate Rash    REACTION: unspecified  . Hydrocodone-Acetaminophen Rash    REACTION: unspecified  . Amoxicillin-Pot Clavulanate     REACTION: unspecified    Current Outpatient  Prescriptions on File Prior to Visit  Medication Sig Dispense Refill  . acyclovir (ZOVIRAX) 400 MG tablet TAKE ONE TABLET BY MOUTH THREE TIMES DAILY AS NEEDED FOR COLD SORE  30 tablet  1  . albuterol (PROVENTIL HFA) 108 (90 BASE) MCG/ACT inhaler Inhale 2 puffs into the lungs every 6 (six) hours as needed for wheezing.      Marland Kitchen aspirin 81 MG tablet Take 81 mg by mouth daily.        . CRESTOR 40 MG tablet TAKE ONE TABLET BY MOUTH EVERY DAY  30 each  12  . desvenlafaxine (PRISTIQ) 100 MG 24 hr tablet Take 1 tablet (100 mg total) by mouth daily.  30 tablet  6  . estradiol (VIVELLE-DOT) 0.05 MG/24HR Place 1 patch (0.05 mg total)  onto the skin 2 (two) times a week. Change twice a week  10 patch  11  . losartan (COZAAR) 50 MG tablet Take 1 tablet (50 mg total) by mouth 2 (two) times daily.  180 tablet  3  . nadolol (CORGARD) 20 MG tablet TAKE ONE TABLET BY MOUTH TWICE DAILY (INCREASED 05/18/2010)  60 tablet  3  . NEXIUM 40 MG capsule TAKE ONE CAPSULE BY MOUTH DAILY  30 each  6  . tiotropium (SPIRIVA HANDIHALER) 18 MCG inhalation capsule Place 1 capsule (18 mcg total) into inhaler and inhale daily.      Marland Kitchen DISCONTD: Aclidinium Bromide (TUDORZA PRESSAIR) 400 MCG/ACT AEPB Inhale 1 puff into the lungs 2 (two) times daily.  1 each  11    BP 120/80  Pulse 68  Temp 98.2 F (36.8 C)  Resp 14  Ht 5' 2.5" (1.588 m)  Wt 143 lb (64.864 kg)  BMI 25.74 kg/m2       Objective:   Physical Exam  Nursing note and vitals reviewed. Constitutional: She is oriented to person, place, and time. She appears well-developed and well-nourished. No distress.  HENT:  Head: Normocephalic and atraumatic.  Right Ear: External ear normal.  Left Ear: External ear normal.  Nose: Nose normal.  Mouth/Throat: Oropharynx is clear and moist.  Eyes: Conjunctivae and EOM are normal. Pupils are equal, round, and reactive to light.  Neck: Normal range of motion. Neck supple. No JVD present. No tracheal deviation present. No thyromegaly present.  Cardiovascular: Normal rate, regular rhythm, normal heart sounds and intact distal pulses.   No murmur heard. Pulmonary/Chest: Effort normal and breath sounds normal. She has no wheezes. She exhibits no tenderness.  Abdominal: Soft. Bowel sounds are normal.  Musculoskeletal: Normal range of motion. She exhibits no edema and no tenderness.  Lymphadenopathy:    She has no cervical adenopathy.  Neurological: She is alert and oriented to person, place, and time. She has normal reflexes. No cranial nerve deficit.  Skin: Skin is warm and dry. She is not diaphoretic.  Psychiatric: She has a normal mood and affect.  Her behavior is normal.          Assessment & Plan:  Patient has a history of CAD she is on blood pressure control and Crestor for risk modification.  She also takes a beta blocker she has as a comorbid findings COPD on Spiriva. 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

## 2012-04-01 NOTE — Patient Instructions (Addendum)
Take mucinex DM twice a day for week

## 2012-05-22 ENCOUNTER — Other Ambulatory Visit: Payer: Self-pay | Admitting: Internal Medicine

## 2012-06-21 ENCOUNTER — Other Ambulatory Visit: Payer: Self-pay | Admitting: Internal Medicine

## 2012-07-02 ENCOUNTER — Encounter: Payer: Self-pay | Admitting: Internal Medicine

## 2012-07-02 ENCOUNTER — Ambulatory Visit (INDEPENDENT_AMBULATORY_CARE_PROVIDER_SITE_OTHER): Payer: BC Managed Care – PPO | Admitting: Internal Medicine

## 2012-07-02 VITALS — BP 140/84 | HR 76 | Temp 98.2°F | Resp 16 | Ht 60.25 in | Wt 149.0 lb

## 2012-07-02 DIAGNOSIS — K2270 Barrett's esophagus without dysplasia: Secondary | ICD-10-CM

## 2012-07-02 DIAGNOSIS — I359 Nonrheumatic aortic valve disorder, unspecified: Secondary | ICD-10-CM

## 2012-07-02 DIAGNOSIS — J449 Chronic obstructive pulmonary disease, unspecified: Secondary | ICD-10-CM

## 2012-07-02 DIAGNOSIS — I10 Essential (primary) hypertension: Secondary | ICD-10-CM

## 2012-07-02 DIAGNOSIS — G43909 Migraine, unspecified, not intractable, without status migrainosus: Secondary | ICD-10-CM

## 2012-07-02 DIAGNOSIS — Z23 Encounter for immunization: Secondary | ICD-10-CM

## 2012-07-02 MED ORDER — NADOLOL 20 MG PO TABS: 20.0000 mg | ORAL_TABLET | Freq: Two times a day (BID) | ORAL | Status: DC

## 2012-07-02 NOTE — Progress Notes (Signed)
  Subjective:    Patient ID: Katherine Foley, female    DOB: 09-20-1950, 61 y.o.   MRN: [id number]  HPI Increased dysphagia and increased migraines ( taking aleve) these are related She has episodic dizziness that is exacerbated by rapid movements of the head that appears to be more labyrinthitis in etiology she is not orthostatic and on examination has nystagmus     Review of Systems  Constitutional: Negative for activity change, appetite change and fatigue.  HENT: Negative for ear pain, congestion, neck pain, postnasal drip and sinus pressure.   Eyes: Negative for redness and visual disturbance.  Respiratory: Negative for cough, shortness of breath and wheezing.   Gastrointestinal: Negative for abdominal pain and abdominal distention.  Genitourinary: Negative for dysuria, frequency and menstrual problem.  Musculoskeletal: Negative for myalgias, joint swelling and arthralgias.  Skin: Negative for rash and wound.  Neurological: Negative for dizziness, weakness and headaches.  Hematological: Negative for adenopathy. Does not bruise/bleed easily.  Psychiatric/Behavioral: Negative for disturbed wake/sleep cycle and decreased concentration.       Objective:   Physical Exam  Nursing note and vitals reviewed. Constitutional: She is oriented to person, place, and time. She appears well-developed and well-nourished. No distress.  HENT:  Head: Normocephalic and atraumatic.  Right Ear: External ear normal.  Left Ear: External ear normal.  Nose: Nose normal.  Mouth/Throat: Oropharynx is clear and moist.  Eyes: Conjunctivae normal and EOM are normal. Pupils are equal, round, and reactive to light.  Neck: Normal range of motion. Neck supple. No JVD present. No tracheal deviation present. No thyromegaly present.  Cardiovascular: Normal rate, regular rhythm and intact distal pulses.   Murmur heard. Pulmonary/Chest: Effort normal and breath sounds normal. She has no wheezes. She exhibits no  tenderness.  Abdominal: Soft. Bowel sounds are normal.  Musculoskeletal: Normal range of motion. She exhibits no edema and no tenderness.  Lymphadenopathy:    She has no cervical adenopathy.  Neurological: She is alert and oriented to person, place, and time. She has normal reflexes. No cranial nerve deficit.       The nystagmus to the left  Skin: Skin is warm and dry. She is not diaphoretic.  Psychiatric: She has a normal mood and affect. Her behavior is normal.          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

## 2012-07-23 ENCOUNTER — Other Ambulatory Visit: Payer: Self-pay | Admitting: Internal Medicine

## 2012-08-28 ENCOUNTER — Other Ambulatory Visit: Payer: Self-pay | Admitting: Internal Medicine

## 2012-10-01 ENCOUNTER — Encounter: Payer: Self-pay | Admitting: Internal Medicine

## 2012-10-01 ENCOUNTER — Ambulatory Visit: Payer: BC Managed Care – PPO | Admitting: Internal Medicine

## 2012-10-01 ENCOUNTER — Ambulatory Visit (INDEPENDENT_AMBULATORY_CARE_PROVIDER_SITE_OTHER): Payer: BC Managed Care – PPO | Admitting: Internal Medicine

## 2012-10-01 VITALS — BP 140/80 | HR 72 | Temp 98.6°F | Resp 16 | Ht 60.25 in | Wt 146.0 lb

## 2012-10-01 DIAGNOSIS — E785 Hyperlipidemia, unspecified: Secondary | ICD-10-CM

## 2012-10-01 DIAGNOSIS — I10 Essential (primary) hypertension: Secondary | ICD-10-CM

## 2012-10-01 DIAGNOSIS — F341 Dysthymic disorder: Secondary | ICD-10-CM

## 2012-10-01 DIAGNOSIS — I6529 Occlusion and stenosis of unspecified carotid artery: Secondary | ICD-10-CM

## 2012-10-01 NOTE — Progress Notes (Signed)
Subjective:    Patient ID: Katherine Foley, female    DOB: 1950/12/21, 62 y.o.   MRN: [id number]  HPI  Increased stress and has noted "melt down at work" and has noted increased out breaks of HSV Needs protein in diet   Review of Systems  Constitutional: Negative for activity change, appetite change and fatigue.  HENT: Negative for ear pain, congestion, neck pain, postnasal drip and sinus pressure.   Eyes: Negative for redness and visual disturbance.  Respiratory: Negative for cough, shortness of breath and wheezing.   Gastrointestinal: Negative for abdominal pain and abdominal distention.  Genitourinary: Negative for dysuria, frequency and menstrual problem.  Musculoskeletal: Negative for myalgias, joint swelling and arthralgias.  Skin: Negative for rash and wound.  Neurological: Negative for dizziness, weakness and headaches.  Hematological: Negative for adenopathy. Does not bruise/bleed easily.  Psychiatric/Behavioral: Negative for sleep disturbance and decreased concentration.       Past Medical History  Diagnosis Date  . ANXIETY DEPRESSION 02/15/2009  . AORTIC STENOSIS 07/07/2010    a. echo 12/23/10: EF 60-65%, mod to severe AS with mean gradient 29 mmHg  . Barrett's esophagus 04/14/2007  . CAD, NATIVE VESSEL 05/17/2010    a. NSTEMI 8/11 - BMS to RCA;  b. cath 4/12: dLAD 50%, RI 80% (PCI), AVCFX 30%, pRCA 30%, stent ok, 40-50, then 50% dist RCA;  c. s/p Promus DES to RI 12/25/10  . Carotid stenosis 05/17/2010    a. dopplers 11/11: RICA 40-50%; LICA 0-39% (follow up due 11/12)  . 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 Herpes simplex without mention of complication 01/16/2010  . HYPERLIPIDEMIA, MILD 08/29/2009  . HYPERTENSION 04/14/2007  . MIGRAINE HEADACHE 04/20/2008  . OSTEOPENIA 04/14/2007  . URINARY INCONTINENCE, STRESS, MILD 08/26/2007  . GERD (gastroesophageal reflux disease)   . Menopause   . Ovarian cyst   . Migraine   . Urine, incontinence, stress female    . Adenomatous colon polyp     History   Social History  . Marital Status: Single    Spouse Name: N/A    Number of Children: N/A  . Years of Education: N/A   Occupational History  . Not on file.   Social History Main Topics  . Smoking status: Former Games developer  . Smokeless tobacco: Not on file     Comment: quit in 2005  . Alcohol Use: No  . Drug Use: No  . Sexually Active: Yes   Other Topics Concern  . Not on file   Social History Narrative  . No narrative on file    Past Surgical History  Procedure Date  . Total abdominal hysterectomy   . Tonsillectomy and adenoidectomy   . Nasal sinus surgery     x 2  . Eye lid surgery   . Thrombosed hemorrohid lanced   . Coronary artery bypass graft     Family History  Problem Relation Age of Onset  . Coronary artery disease Other   . Heart disease Other     6 stents  . Heart disease Mother   . COPD Father   . Heart disease Father   . Heart disease Maternal Uncle     Allergies  Allergen Reactions  . Codeine Sulfate Rash    REACTION: unspecified  . Hydrocodone-Acetaminophen Rash    REACTION: unspecified  . Amoxicillin-Pot Clavulanate     REACTION: unspecified    Current Outpatient Prescriptions on File Prior to Visit  Medication Sig Dispense Refill  . albuterol (  PROVENTIL HFA;VENTOLIN HFA) 108 (90 BASE) MCG/ACT inhaler Inhale 2 puffs into the lungs every 6 (six) hours as needed.      Marland Kitchen aspirin 81 MG tablet Take 81 mg by mouth daily.        . CRESTOR 40 MG tablet TAKE ONE TABLET BY MOUTH EVERY DAY  30 each  12  . desvenlafaxine (PRISTIQ) 100 MG 24 hr tablet Take 1 tablet (100 mg total) by mouth daily.  30 tablet  6  . losartan (COZAAR) 50 MG tablet Take 1 tablet (50 mg total) by mouth 2 (two) times daily.  180 tablet  3  . nadolol (CORGARD) 20 MG tablet Take 1 tablet (20 mg total) by mouth 2 (two) times daily.  60 tablet  9  . NEXIUM 40 MG capsule TAKE ONE CAPSULE BY MOUTH DAILY  30 each  6  . SPIRIVA HANDIHALER 18  MCG inhalation capsule INHALE ONE PUFF EVERY DAY  30 capsule  6  . VIVELLE-DOT 0.05 MG/24HR APPLY ONE PATCH TOPICALLY TWICE A WEEK. CHANGE TWICE A WEEK  10 each  11  . [DISCONTINUED] Aclidinium Bromide (TUDORZA PRESSAIR) 400 MCG/ACT AEPB Inhale 1 puff into the lungs 2 (two) times daily.  1 each  11    BP 140/80  Pulse 72  Temp 98.6 F (37 C)  Resp 16  Ht 5' 0.25" (1.53 m)  Wt 146 lb (66.225 kg)  BMI 28.28 kg/m2    Objective:   Physical Exam  Constitutional: She is oriented to person, place, and time. She appears well-developed and well-nourished. No distress.  HENT:  Head: Normocephalic and atraumatic.  Right Ear: External ear normal.  Left Ear: External ear normal.  Nose: Nose normal.  Mouth/Throat: Oropharynx is clear and moist.  Eyes: Conjunctivae normal and EOM are normal. Pupils are equal, round, and reactive to light.  Neck: Normal range of motion. Neck supple. No JVD present. No tracheal deviation present. No thyromegaly present.  Cardiovascular: Normal rate and regular rhythm.   Murmur heard.      Right bruit  Pulmonary/Chest: Effort normal and breath sounds normal. She has no wheezes. She exhibits no tenderness.  Abdominal: Soft. Bowel sounds are normal.  Musculoskeletal: Normal range of motion. She exhibits no edema and no tenderness.  Lymphadenopathy:    She has no cervical adenopathy.  Neurological: She is alert and oriented to person, place, and time. She has normal reflexes. No cranial nerve deficit.  Skin: Skin is warm and dry. She is not diaphoretic.  Psychiatric: She has a normal mood and affect. Her behavior is normal.          Assessment & Plan:  Discussed protein in diet Due monitoring for carotid History of peripheral vascular disease and status post CABG Stable chest Complete physical examination due in April May at which time we should monitor her liver and lipid and renal function on Cozaar and Crestor Exercise proper eating cut sugar and eat  protein

## 2012-10-06 ENCOUNTER — Encounter (INDEPENDENT_AMBULATORY_CARE_PROVIDER_SITE_OTHER): Payer: BC Managed Care – PPO

## 2012-10-06 DIAGNOSIS — I6529 Occlusion and stenosis of unspecified carotid artery: Secondary | ICD-10-CM

## 2012-10-06 DIAGNOSIS — R0989 Other specified symptoms and signs involving the circulatory and respiratory systems: Secondary | ICD-10-CM

## 2012-11-14 ENCOUNTER — Encounter: Payer: Self-pay | Admitting: Family

## 2012-11-14 ENCOUNTER — Ambulatory Visit (INDEPENDENT_AMBULATORY_CARE_PROVIDER_SITE_OTHER): Payer: BC Managed Care – PPO | Admitting: Family

## 2012-11-14 VITALS — BP 110/62 | HR 61 | Wt 150.0 lb

## 2012-11-14 DIAGNOSIS — M5412 Radiculopathy, cervical region: Secondary | ICD-10-CM

## 2012-11-14 DIAGNOSIS — M79601 Pain in right arm: Secondary | ICD-10-CM

## 2012-11-14 DIAGNOSIS — M79609 Pain in unspecified limb: Secondary | ICD-10-CM

## 2012-11-14 MED ORDER — TRAMADOL HCL 50 MG PO TABS: 50.0000 mg | ORAL_TABLET | Freq: Three times a day (TID) | ORAL | Status: DC | PRN

## 2012-11-14 MED ORDER — PREDNISONE 20 MG PO TABS: ORAL_TABLET | ORAL | Status: AC

## 2012-11-14 NOTE — Patient Instructions (Addendum)
Cervical Radiculopathy  Cervical radiculopathy happens when a nerve in the neck is pinched or bruised by a slipped (herniated) disk or by arthritic changes in the bones of the cervical spine. This can occur due to an injury or as part of the normal aging process. Pressure on the cervical nerves can cause pain or numbness that runs from your neck all the way down into your arm and fingers.  CAUSES   There are many possible causes, including:   Injury.   Muscle tightness in the neck from overuse.   Swollen, painful joints (arthritis).   Breakdown or degeneration in the bones and joints of the spine (spondylosis) due to aging.   Bone spurs that may develop near the cervical nerves.  SYMPTOMS   Symptoms include pain, weakness, or numbness in the affected arm and hand. Pain can be severe or irritating. Symptoms may be worse when extending or turning the neck.  DIAGNOSIS   Your caregiver will ask about your symptoms and do a physical exam. He or she may test your strength and reflexes. X-rays, CT scans, and MRI scans may be needed in cases of injury or if the symptoms do not go away after a period of time. Electromyography (EMG) or nerve conduction testing may be done to study how your nerves and muscles are working.  TREATMENT   Your caregiver may recommend certain exercises to help relieve your symptoms. Cervical radiculopathy can, and often does, get better with time and treatment. If your problems continue, treatment options may include:   Wearing a soft collar for short periods of time.   Physical therapy to strengthen the neck muscles.   Medicines, such as nonsteroidal anti-inflammatory drugs (NSAIDs), oral corticosteroids, or spinal injections.   Surgery. Different types of surgery may be done depending on the cause of your problems.  HOME CARE INSTRUCTIONS    Put ice on the affected area.   Put ice in a plastic bag.   Place a towel between your skin and the bag.   Leave the ice on for 15 to 20  minutes, 3 to 4 times a day or as directed by your caregiver.   If ice does not help, you can try using heat. Take a warm shower or bath, or use a hot water bottle as directed by your caregiver.   You may try a gentle neck and shoulder massage.   Use a flat pillow when you sleep.   Only take over-the-counter or prescription medicines for pain, discomfort, or fever as directed by your caregiver.   If physical therapy was prescribed, follow your caregiver's directions.   If a soft collar was prescribed, use it as directed.  SEEK IMMEDIATE MEDICAL CARE IF:    Your pain gets much worse and cannot be controlled with medicines.   You have weakness or numbness in your hand, arm, face, or leg.   You have a high fever or a stiff, rigid neck.   You lose bowel or bladder control (incontinence).   You have trouble with walking, balance, or speaking.  MAKE SURE YOU:    Understand these instructions.   Will watch your condition.   Will get help right away if you are not doing well or get worse.  Document Released: 05/15/2001 Document Revised: 11/12/2011 Document Reviewed: 04/03/2011  [employer] Patient Information 2013 [employer].

## 2012-11-14 NOTE — Progress Notes (Signed)
Subjective:    Patient ID: Katherine Foley, female    DOB: [birth year], 62 y.o.   MRN: [id number]  HPI 62 year old white female, nonsmoker, patient of Dr. Lovell Sheehan is in with complaints of right upper arm pain x3-4 months that radiates into her right shoulder. She's been taking 2 Advil a day that helps. However, her symptoms have become more constant and worsening. She is a Nature conservation officer at [employer] and typically lifts boxes around 20 pounds above her head which makes her symptoms worse. She describes it as a constant ache with sharp pain with certain movement. She also reports bilateral hand numbness that occurs periodically lasting a few seconds then going away.  Review of Systems  Constitutional: Negative.   HENT: Negative.   Respiratory: Negative.   Cardiovascular: Negative.   Musculoskeletal: Positive for myalgias.       Right arm pain  Skin: Negative.   Allergic/Immunologic: Negative.   Neurological: Positive for numbness.       Random bouts of numbness in the hands bilaterally.   Hematological: Negative.   Psychiatric/Behavioral: Negative.    Past Medical History  Diagnosis Date  . ANXIETY DEPRESSION 02/15/2009  . AORTIC STENOSIS 07/07/2010    a. echo 12/23/10: EF 60-65%, mod to severe AS with mean gradient 29 mmHg  . Barrett's esophagus 04/14/2007  . CAD, NATIVE VESSEL 05/17/2010    a. NSTEMI 8/11 - BMS to RCA;  b. cath 4/12: dLAD 50%, RI 80% (PCI), AVCFX 30%, pRCA 30%, stent ok, 40-50, then 50% dist RCA;  c. s/p Promus DES to RI 12/25/10  . Carotid stenosis 05/17/2010    a. dopplers 11/11: RICA 40-50%; LICA 0-39% (follow up due 11/12)  . 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 Herpes simplex without mention of complication 01/16/2010  . HYPERLIPIDEMIA, MILD 08/29/2009  . HYPERTENSION 04/14/2007  . MIGRAINE HEADACHE 04/20/2008  . OSTEOPENIA 04/14/2007  . URINARY INCONTINENCE, STRESS, MILD 08/26/2007  . GERD (gastroesophageal reflux disease)   . Menopause   . Ovarian cyst    . Migraine   . Urine, incontinence, stress female   . Adenomatous colon polyp     History   Social History  . Marital Status: Single    Spouse Name: N/A    Number of Children: N/A  . Years of Education: N/A   Occupational History  . Not on file.   Social History Main Topics  . Smoking status: Former Games developer  . Smokeless tobacco: Not on file     Comment: quit in 2005  . Alcohol Use: No  . Drug Use: No  . Sexually Active: Yes   Other Topics Concern  . Not on file   Social History Narrative  . No narrative on file    Past Surgical History  Procedure Laterality Date  . Total abdominal hysterectomy    . Tonsillectomy and adenoidectomy    . Nasal sinus surgery      x 2  . Eye lid surgery    . Thrombosed hemorrohid lanced    . Coronary artery bypass graft      Family History  Problem Relation Age of Onset  . Coronary artery disease Other   . Heart disease Other     6 stents  . Heart disease Mother   . COPD Father   . Heart disease Father   . Heart disease Maternal Uncle     Allergies  Allergen Reactions  . Codeine Sulfate Rash    REACTION: unspecified  . Hydrocodone-Acetaminophen  Rash    REACTION: unspecified  . Amoxicillin-Pot Clavulanate     REACTION: unspecified    Current Outpatient Prescriptions on File Prior to Visit  Medication Sig Dispense Refill  . albuterol (PROVENTIL HFA;VENTOLIN HFA) 108 (90 BASE) MCG/ACT inhaler Inhale 2 puffs into the lungs every 6 (six) hours as needed.      Marland Kitchen aspirin 81 MG tablet Take 81 mg by mouth daily.        . CRESTOR 40 MG tablet TAKE ONE TABLET BY MOUTH EVERY DAY  30 each  12  . desvenlafaxine (PRISTIQ) 100 MG 24 hr tablet Take 1 tablet (100 mg total) by mouth daily.  30 tablet  6  . nadolol (CORGARD) 20 MG tablet Take 1 tablet (20 mg total) by mouth 2 (two) times daily.  60 tablet  9  . NEXIUM 40 MG capsule TAKE ONE CAPSULE BY MOUTH DAILY  30 each  6  . SPIRIVA HANDIHALER 18 MCG inhalation capsule INHALE ONE  PUFF EVERY DAY  30 capsule  6  . VIVELLE-DOT 0.05 MG/24HR APPLY ONE PATCH TOPICALLY TWICE A WEEK. CHANGE TWICE A WEEK  10 each  11  . losartan (COZAAR) 50 MG tablet Take 1 tablet (50 mg total) by mouth 2 (two) times daily.  180 tablet  3  . [DISCONTINUED] Aclidinium Bromide (TUDORZA PRESSAIR) 400 MCG/ACT AEPB Inhale 1 puff into the lungs 2 (two) times daily.  1 each  11   No current facility-administered medications on file prior to visit.    BP 110/62  Pulse 61  Wt 150 lb (68.04 kg)  BMI 29.07 kg/m2  SpO2 97%chart    Objective:   Physical Exam  Constitutional: She is oriented to person, place, and time. She appears well-developed and well-nourished.  Neck: Normal range of motion. Neck supple.  Cardiovascular: Normal rate, regular rhythm and normal heart sounds.   Pulmonary/Chest: Effort normal and breath sounds normal.  Musculoskeletal: Normal range of motion. She exhibits tenderness.  Right upper arm pain and tenderness to palpation. Pain with forced flexion and extension. Pain with flexion of the neck.  Neurological: She is alert and oriented to person, place, and time. She has normal reflexes. She displays normal reflexes. No cranial nerve deficit. Coordination normal.  Skin: Skin is warm and dry.  Psychiatric: She has a normal mood and affect.          Assessment & Plan:  Assessment:  1. Right arm pain 2.. Cervical Radiculopathy  Plan:  Prednisone 60x3, 40x3, 20x3. Consider an MRI of the cervical spine if symptoms persist. Tramadol as needed for pain. Patient will not lift anything heavier than 10 pounds. Call the office if symptoms worsen or persist. Recheck as scheduled, and as needed.

## 2012-11-17 ENCOUNTER — Other Ambulatory Visit: Payer: Self-pay | Admitting: Internal Medicine

## 2012-12-03 ENCOUNTER — Telehealth: Payer: Self-pay | Admitting: Internal Medicine

## 2012-12-03 NOTE — Telephone Encounter (Signed)
Note printed.

## 2012-12-03 NOTE — Telephone Encounter (Signed)
Pt was seen on 11-14-12 and now  needs a work note with no restrictions. Pt will pick up

## 2012-12-20 ENCOUNTER — Other Ambulatory Visit: Payer: Self-pay | Admitting: Internal Medicine

## 2013-01-20 ENCOUNTER — Other Ambulatory Visit: Payer: Self-pay | Admitting: Internal Medicine

## 2013-01-30 ENCOUNTER — Other Ambulatory Visit (INDEPENDENT_AMBULATORY_CARE_PROVIDER_SITE_OTHER): Payer: BC Managed Care – PPO

## 2013-01-30 DIAGNOSIS — Z0000 Encounter for general adult medical examination without abnormal findings: Secondary | ICD-10-CM

## 2013-01-30 LAB — CBC WITH DIFFERENTIAL/PLATELET
Basophils Relative: 0.3 % (ref 0.0–3.0)
Eosinophils Absolute: 0 10*3/uL (ref 0.0–0.7)
MCHC: 33.5 g/dL (ref 30.0–36.0)
MCV: 91.4 fl (ref 78.0–100.0)
Monocytes Absolute: 0.7 10*3/uL (ref 0.1–1.0)
Neutrophils Relative %: 58.4 % (ref 43.0–77.0)
RBC: 4.23 Mil/uL (ref 3.87–5.11)

## 2013-01-30 LAB — POCT URINALYSIS DIPSTICK
Ketones, UA: NEGATIVE
Leukocytes, UA: NEGATIVE
Nitrite, UA: NEGATIVE
Protein, UA: NEGATIVE
Urobilinogen, UA: 0.2

## 2013-01-30 LAB — BASIC METABOLIC PANEL
BUN: 9 mg/dL (ref 6–23)
CO2: 29 mEq/L (ref 19–32)
Chloride: 100 mEq/L (ref 96–112)
Creatinine, Ser: 0.6 mg/dL (ref 0.4–1.2)

## 2013-01-30 LAB — HEPATIC FUNCTION PANEL
Albumin: 3.8 g/dL (ref 3.5–5.2)
Bilirubin, Direct: 0 mg/dL (ref 0.0–0.3)
Total Protein: 6.6 g/dL (ref 6.0–8.3)

## 2013-01-30 LAB — TSH: TSH: 0.75 u[IU]/mL (ref 0.35–5.50)

## 2013-01-30 LAB — LIPID PANEL: Cholesterol: 137 mg/dL (ref 0–200)

## 2013-02-06 ENCOUNTER — Encounter: Payer: Self-pay | Admitting: Internal Medicine

## 2013-02-06 ENCOUNTER — Ambulatory Visit (INDEPENDENT_AMBULATORY_CARE_PROVIDER_SITE_OTHER): Payer: BC Managed Care – PPO | Admitting: Internal Medicine

## 2013-02-06 VITALS — BP 116/72 | HR 72 | Temp 98.3°F | Resp 16 | Ht 61.0 in | Wt 148.0 lb

## 2013-02-06 DIAGNOSIS — Z0000 Encounter for general adult medical examination without abnormal findings: Secondary | ICD-10-CM

## 2013-02-06 DIAGNOSIS — I359 Nonrheumatic aortic valve disorder, unspecified: Secondary | ICD-10-CM

## 2013-02-06 DIAGNOSIS — I10 Essential (primary) hypertension: Secondary | ICD-10-CM

## 2013-02-06 NOTE — Patient Instructions (Signed)
Stop the cozaar

## 2013-02-06 NOTE — Progress Notes (Signed)
Subjective:    Patient ID: Katherine Foley, female    DOB: 10-30-1950, 62 y.o.   MRN: [id number]  HPI This is a 62 year old female who presents for her yearly examination.  Her blood work is excellent with the exception of a low sodium at 133 and a potassium of 5.6.  One year ago showed her potassium was 5.2 her potassium has ranged in the high fives since she's been on losartan.     Review of Systems  Constitutional: Negative for activity change, appetite change and fatigue.  HENT: Negative for ear pain, congestion, neck pain, postnasal drip and sinus pressure.   Eyes: Negative for redness and visual disturbance.  Respiratory: Negative for cough, shortness of breath and wheezing.   Gastrointestinal: Negative for abdominal pain and abdominal distention.  Genitourinary: Negative for dysuria, frequency and menstrual problem.  Musculoskeletal: Negative for myalgias, joint swelling and arthralgias.  Skin: Negative for rash and wound.  Neurological: Negative for dizziness, weakness and headaches.  Hematological: Negative for adenopathy. Does not bruise/bleed easily.  Psychiatric/Behavioral: Negative for sleep disturbance and decreased concentration.   Past Medical History  Diagnosis Date  . ANXIETY DEPRESSION 02/15/2009  . AORTIC STENOSIS 07/07/2010    a. echo 12/23/10: EF 60-65%, mod to severe AS with mean gradient 29 mmHg  . Barrett's esophagus 04/14/2007  . CAD, NATIVE VESSEL 05/17/2010    a. NSTEMI 8/11 - BMS to RCA;  b. cath 4/12: dLAD 50%, RI 80% (PCI), AVCFX 30%, pRCA 30%, stent ok, 40-50, then 50% dist RCA;  c. s/p Promus DES to RI 12/25/10  . Carotid stenosis 05/17/2010    a. dopplers 11/11: RICA 40-50%; LICA 0-39% (follow up due 11/12)  . 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 Herpes simplex without mention of complication 01/16/2010  . HYPERLIPIDEMIA, MILD 08/29/2009  . HYPERTENSION 04/14/2007  . MIGRAINE HEADACHE 04/20/2008  . OSTEOPENIA 04/14/2007  . URINARY  INCONTINENCE, STRESS, MILD 08/26/2007  . GERD (gastroesophageal reflux disease)   . Menopause   . Ovarian cyst   . Migraine   . Urine, incontinence, stress female   . Adenomatous colon polyp     History   Social History  . Marital Status: Single    Spouse Name: N/A    Number of Children: N/A  . Years of Education: N/A   Occupational History  . Not on file.   Social History Main Topics  . Smoking status: Former Games developer  . Smokeless tobacco: Not on file     Comment: quit in 2005  . Alcohol Use: No  . Drug Use: No  . Sexually Active: Yes   Other Topics Concern  . Not on file   Social History Narrative  . No narrative on file    Past Surgical History  Procedure Laterality Date  . Total abdominal hysterectomy    . Tonsillectomy and adenoidectomy    . Nasal sinus surgery      x 2  . Eye lid surgery    . Thrombosed hemorrohid lanced    . Coronary artery bypass graft      Family History  Problem Relation Age of Onset  . Coronary artery disease Other   . Heart disease Other     6 stents  . Heart disease Mother   . COPD Father   . Heart disease Father   . Heart disease Maternal Uncle     Allergies  Allergen Reactions  . Codeine Sulfate Rash    REACTION: unspecified  .  Hydrocodone-Acetaminophen Rash    REACTION: unspecified  . Amoxicillin-Pot Clavulanate     REACTION: unspecified    Current Outpatient Prescriptions on File Prior to Visit  Medication Sig Dispense Refill  . albuterol (PROVENTIL HFA;VENTOLIN HFA) 108 (90 BASE) MCG/ACT inhaler Inhale 2 puffs into the lungs every 6 (six) hours as needed.      Marland Kitchen aspirin 81 MG tablet Take 81 mg by mouth daily.        . CRESTOR 40 MG tablet TAKE ONE TABLET BY MOUTH EVERY DAY  30 each  12  . desvenlafaxine (PRISTIQ) 100 MG 24 hr tablet Take 1 tablet (100 mg total) by mouth daily.  30 tablet  6  . nadolol (CORGARD) 20 MG tablet Take 1 tablet (20 mg total) by mouth 2 (two) times daily.  60 tablet  9  . NEXIUM 40 MG  capsule TAKE ONE CAPSULE BY MOUTH ONCE DAILY  90 capsule  3  . SPIRIVA HANDIHALER 18 MCG inhalation capsule INHALE ONE PUFF EVERY DAY  30 capsule  6  . VIVELLE-DOT 0.05 MG/24HR APPLY ONE PATCH TOPICALLY TWICE A WEEK. CHANGE TWICE A WEEK  10 each  11  . [DISCONTINUED] Aclidinium Bromide (TUDORZA PRESSAIR) 400 MCG/ACT AEPB Inhale 1 puff into the lungs 2 (two) times daily.  1 each  11   No current facility-administered medications on file prior to visit.    BP 116/72  Pulse 72  Temp(Src) 98.3 F (36.8 C)  Resp 16  Ht 5\' 1"  (1.549 m)  Wt 148 lb (67.132 kg)  BMI 27.98 kg/m2       Objective:   Physical Exam  Constitutional: She is oriented to person, place, and time. She appears well-developed and well-nourished. No distress.  HENT:  Head: Normocephalic and atraumatic.  Right Ear: External ear normal.  Left Ear: External ear normal.  Nose: Nose normal.  Mouth/Throat: Oropharynx is clear and moist.  Eyes: Conjunctivae and EOM are normal. Pupils are equal, round, and reactive to light.  Neck: Normal range of motion. Neck supple. No JVD present. No tracheal deviation present. No thyromegaly present.  Cardiovascular: Normal rate, regular rhythm and intact distal pulses.   Murmur heard. Pulmonary/Chest: Effort normal and breath sounds normal. She has no wheezes. She exhibits no tenderness.  Abdominal: Soft. Bowel sounds are normal.  Musculoskeletal: Normal range of motion. She exhibits no edema and no tenderness.  Lymphadenopathy:    She has no cervical adenopathy.  Neurological: She is alert and oriented to person, place, and time. She has normal reflexes. No cranial nerve deficit.  Skin: Skin is warm and dry. She is not diaphoretic.  Psychiatric: She has a normal mood and affect. Her behavior is normal.          Assessment & Plan:   This is a routine physical examination for this healthy  Female. Reviewed all health maintenance protocols including mammography colonoscopy bone  density and reviewed appropriate screening labs. Her immunization history was reviewed as well as her current medications and allergies refills of her chronic medications were given and the plan for yearly health maintenance was discussed all orders and referrals were made as appropriate.   We'll continue the Corgard as prescribed but will stop the ARB class drugs do to elevated potassium and low sodium.  If she requires additional blood pressure control we would consider changing the Corgard to  a beta blocker/ alfa block er combination

## 2013-03-03 ENCOUNTER — Encounter: Payer: Self-pay | Admitting: Internal Medicine

## 2013-03-03 ENCOUNTER — Ambulatory Visit (INDEPENDENT_AMBULATORY_CARE_PROVIDER_SITE_OTHER): Payer: BC Managed Care – PPO | Admitting: Internal Medicine

## 2013-03-03 VITALS — BP 130/80 | HR 67 | Temp 97.8°F | Resp 20 | Wt 150.0 lb

## 2013-03-03 DIAGNOSIS — R1013 Epigastric pain: Secondary | ICD-10-CM

## 2013-03-03 DIAGNOSIS — G8929 Other chronic pain: Secondary | ICD-10-CM

## 2013-03-03 MED ORDER — TRAMADOL HCL 50 MG PO TABS: 50.0000 mg | ORAL_TABLET | Freq: Three times a day (TID) | ORAL | Status: DC | PRN

## 2013-03-03 NOTE — Progress Notes (Signed)
Subjective:    Patient ID: Katherine Foley, female    DOB: Mar 04, 1951, 62 y.o.   MRN: [id number]  HPI   62 year old patient has a history of carotid artery disease coronary artery disease status post CABG as well as aortic stenosis.  She presents with a chief complaint of abdominal pain that began yesterday at approximately 8:30 PM. This has been associated with nausea and a single episode of emesis. Pain is maximal in the epigastric area with radiation to the back. She states that she has had similar pain in the past. She has been on chronic PPI therapy. She has had gallbladder ultrasounds in the past but not for a number of years. Denies any fever. Denies any change in her bowel habits  Past Medical History  Diagnosis Date  . ANXIETY DEPRESSION 02/15/2009  . AORTIC STENOSIS 07/07/2010    a. echo 12/23/10: EF 60-65%, mod to severe AS with mean gradient 29 mmHg  . Barrett's esophagus 04/14/2007  . CAD, NATIVE VESSEL 05/17/2010    a. NSTEMI 8/11 - BMS to RCA;  b. cath 4/12: dLAD 50%, RI 80% (PCI), AVCFX 30%, pRCA 30%, stent ok, 40-50, then 50% dist RCA;  c. s/p Promus DES to RI 12/25/10  . Carotid stenosis 05/17/2010    a. dopplers 11/11: RICA 40-50%; LICA 0-39% (follow up due 11/12)  . 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 Herpes simplex without mention of complication 01/16/2010  . HYPERLIPIDEMIA, MILD 08/29/2009  . HYPERTENSION 04/14/2007  . MIGRAINE HEADACHE 04/20/2008  . OSTEOPENIA 04/14/2007  . URINARY INCONTINENCE, STRESS, MILD 08/26/2007  . GERD (gastroesophageal reflux disease)   . Menopause   . Ovarian cyst   . Migraine   . Urine, incontinence, stress female   . Adenomatous colon polyp     History   Social History  . Marital Status: Single    Spouse Name: N/A    Number of Children: N/A  . Years of Education: N/A   Occupational History  . Not on file.   Social History Main Topics  . Smoking status: Former Games developer  . Smokeless tobacco: Not on file     Comment:  quit in 2005  . Alcohol Use: No  . Drug Use: No  . Sexually Active: Yes   Other Topics Concern  . Not on file   Social History Narrative  . No narrative on file    Past Surgical History  Procedure Laterality Date  . Total abdominal hysterectomy    . Tonsillectomy and adenoidectomy    . Nasal sinus surgery      x 2  . Eye lid surgery    . Thrombosed hemorrohid lanced    . Coronary artery bypass graft      Family History  Problem Relation Age of Onset  . Coronary artery disease Other   . Heart disease Other     6 stents  . Heart disease Mother   . COPD Father   . Heart disease Father   . Heart disease Maternal Uncle     Allergies  Allergen Reactions  . Codeine Sulfate Rash    REACTION: unspecified  . Hydrocodone-Acetaminophen Rash    REACTION: unspecified  . Amoxicillin-Pot Clavulanate     REACTION: unspecified    Current Outpatient Prescriptions on File Prior to Visit  Medication Sig Dispense Refill  . albuterol (PROVENTIL HFA;VENTOLIN HFA) 108 (90 BASE) MCG/ACT inhaler Inhale 2 puffs into the lungs every 6 (six) hours as needed.      Marland Kitchen  aspirin 81 MG tablet Take 81 mg by mouth daily.        . CRESTOR 40 MG tablet TAKE ONE TABLET BY MOUTH EVERY DAY  30 each  12  . desvenlafaxine (PRISTIQ) 100 MG 24 hr tablet Take 1 tablet (100 mg total) by mouth daily.  30 tablet  6  . nadolol (CORGARD) 20 MG tablet Take 1 tablet (20 mg total) by mouth 2 (two) times daily.  60 tablet  9  . NEXIUM 40 MG capsule TAKE ONE CAPSULE BY MOUTH ONCE DAILY  90 capsule  3  . SPIRIVA HANDIHALER 18 MCG inhalation capsule INHALE ONE PUFF EVERY DAY  30 capsule  6  . VIVELLE-DOT 0.05 MG/24HR APPLY ONE PATCH TOPICALLY TWICE A WEEK. CHANGE TWICE A WEEK  10 each  11  . [DISCONTINUED] Aclidinium Bromide (TUDORZA PRESSAIR) 400 MCG/ACT AEPB Inhale 1 puff into the lungs 2 (two) times daily.  1 each  11   No current facility-administered medications on file prior to visit.    BP 130/80  Pulse 67   Temp(Src) 97.8 F (36.6 C) (Oral)  Resp 20  Wt 150 lb (68.04 kg)  BMI 28.36 kg/m2  SpO2 97%       Review of Systems  Constitutional: Negative.   HENT: Negative for hearing loss, congestion, sore throat, rhinorrhea, dental problem, sinus pressure and tinnitus.   Eyes: Negative for pain, discharge and visual disturbance.  Respiratory: Negative for cough and shortness of breath.   Cardiovascular: Negative for chest pain, palpitations and leg swelling.  Gastrointestinal: Positive for nausea, vomiting and abdominal pain. Negative for diarrhea, constipation, blood in stool and abdominal distention.  Genitourinary: Negative for dysuria, urgency, frequency, hematuria, flank pain, vaginal bleeding, vaginal discharge, difficulty urinating, vaginal pain and pelvic pain.  Musculoskeletal: Negative for joint swelling, arthralgias and gait problem.  Skin: Negative for rash.  Neurological: Negative for dizziness, syncope, speech difficulty, weakness, numbness and headaches.  Hematological: Negative for adenopathy.  Psychiatric/Behavioral: Negative for behavioral problems, dysphoric mood and agitation. The patient is not nervous/anxious.        Objective:   Physical Exam  Constitutional: She is oriented to person, place, and time. She appears well-developed and well-nourished. No distress.  HENT:  Head: Normocephalic.  Right Ear: External ear normal.  Left Ear: External ear normal.  Mouth/Throat: Oropharynx is clear and moist.  Eyes: Conjunctivae and EOM are normal. Pupils are equal, round, and reactive to light.  Neck: Normal range of motion. Neck supple. No thyromegaly present.  Right carotid versus transmitted murmur noted  Cardiovascular: Normal rate, regular rhythm and intact distal pulses.   Murmur heard. Grade 2/6 systolic murmur loudest at the base  Pulmonary/Chest: Effort normal and breath sounds normal.  Abdominal: Soft. Bowel sounds are normal. She exhibits no mass. There is  tenderness.  Mild right upper quadrant and significant epigastric tenderness. Bowel sounds normal  Musculoskeletal: Normal range of motion.  Lymphadenopathy:    She has no cervical adenopathy.  Neurological: She is alert and oriented to person, place, and time.  Skin: Skin is warm and dry. No rash noted.  Psychiatric: She has a normal mood and affect. Her behavior is normal.          Assessment & Plan:   Abdominal pain. Will place on a bland diet and continue Nexium therapy. Will treat with tramadol for pain. Patient is not fasting today but will check a gallbladder ultrasound in the morning She will report any clinical worsening

## 2013-03-03 NOTE — Patient Instructions (Signed)
Avoids foods high in acid such as tomatoes citrus juices, and spicy foods.  Avoid eating within two hours of lying down or before exercising.  Do not overheat.  Try smaller more frequent meals.  If symptoms persist, elevate the head of her bed 12 inches while sleeping.  Abdominal ultrasound the morning  Call or return to clinic prn if these symptoms worsen or fail to improve as anticipated.

## 2013-03-04 ENCOUNTER — Ambulatory Visit (HOSPITAL_COMMUNITY)
Admission: RE | Admit: 2013-03-04 | Discharge: 2013-03-04 | Disposition: A | Discharge status: Home / Self Care | Payer: BC Managed Care – PPO | Source: Ambulatory Visit | Attending: Internal Medicine | Admitting: Internal Medicine

## 2013-03-04 DIAGNOSIS — R1013 Epigastric pain: Secondary | ICD-10-CM | POA: Insufficient documentation

## 2013-03-04 DIAGNOSIS — K838 Other specified diseases of biliary tract: Secondary | ICD-10-CM | POA: Insufficient documentation

## 2013-03-04 DIAGNOSIS — G8929 Other chronic pain: Secondary | ICD-10-CM

## 2013-03-04 DIAGNOSIS — K7689 Other specified diseases of liver: Secondary | ICD-10-CM | POA: Insufficient documentation

## 2013-03-05 ENCOUNTER — Other Ambulatory Visit: Payer: Self-pay | Admitting: Internal Medicine

## 2013-03-05 DIAGNOSIS — K8020 Calculus of gallbladder without cholecystitis without obstruction: Secondary | ICD-10-CM

## 2013-03-05 DIAGNOSIS — R1013 Epigastric pain: Secondary | ICD-10-CM

## 2013-03-05 DIAGNOSIS — G8929 Other chronic pain: Secondary | ICD-10-CM

## 2013-03-16 ENCOUNTER — Encounter (INDEPENDENT_AMBULATORY_CARE_PROVIDER_SITE_OTHER): Payer: Self-pay | Admitting: General Surgery

## 2013-03-16 ENCOUNTER — Ambulatory Visit (INDEPENDENT_AMBULATORY_CARE_PROVIDER_SITE_OTHER): Payer: BC Managed Care – PPO | Admitting: Surgery

## 2013-03-16 ENCOUNTER — Encounter (INDEPENDENT_AMBULATORY_CARE_PROVIDER_SITE_OTHER): Payer: Self-pay | Admitting: Surgery

## 2013-03-16 ENCOUNTER — Other Ambulatory Visit (INDEPENDENT_AMBULATORY_CARE_PROVIDER_SITE_OTHER): Payer: Self-pay | Admitting: Surgery

## 2013-03-16 VITALS — BP 136/80 | HR 68 | Temp 99.5°F | Resp 15 | Ht 62.5 in | Wt 149.4 lb

## 2013-03-16 DIAGNOSIS — K8000 Calculus of gallbladder with acute cholecystitis without obstruction: Secondary | ICD-10-CM

## 2013-03-16 DIAGNOSIS — R1011 Right upper quadrant pain: Secondary | ICD-10-CM

## 2013-03-16 DIAGNOSIS — K8010 Calculus of gallbladder with chronic cholecystitis without obstruction: Secondary | ICD-10-CM

## 2013-03-16 NOTE — Progress Notes (Signed)
Patient ID: Katherine Foley, female   DOB: August 11, 1951, 62 y.o.   MRN: [id number]  Chief Complaint  Patient presents with  . New Evaluation    eval abd pain/gb    HPI Katherine Foley is a 62 y.o. female.  Referred by Dr. Eleonore Chiquito for evaluation of gallbladder disease PCP - Dr. Darryll Capers  HPI This is a 62 year old female with a history of coronary artery disease status post CABG and aortic valve replacement who presents with abdominal pain in her right upper quadrant that began on March 02, 2013.  This was associated with nausea and vomiting. The pain radiated through to her back around her right side. She has had similar episodes in the Past. She had an ultrasound which showed gallstones but no wall thickening. She presents now for surgical evaluation. Her cardiologist is Dr. Excell Seltzer.  Past Medical History  Diagnosis Date  . ANXIETY DEPRESSION 02/15/2009  . AORTIC STENOSIS 07/07/2010    a. echo 12/23/10: EF 60-65%, mod to severe AS with mean gradient 29 mmHg  . Barrett's esophagus 04/14/2007  . CAD, NATIVE VESSEL 05/17/2010    a. NSTEMI 8/11 - BMS to RCA;  b. cath 4/12: dLAD 50%, RI 80% (PCI), AVCFX 30%, pRCA 30%, stent ok, 40-50, then 50% dist RCA;  c. s/p Promus DES to RI 12/25/10  . Carotid stenosis 05/17/2010    a. dopplers 11/11: RICA 40-50%; LICA 0-39% (follow up due 11/12)  . 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 Herpes simplex without mention of complication 01/16/2010  . HYPERLIPIDEMIA, MILD 08/29/2009  . HYPERTENSION 04/14/2007  . MIGRAINE HEADACHE 04/20/2008  . OSTEOPENIA 04/14/2007  . URINARY INCONTINENCE, STRESS, MILD 08/26/2007  . GERD (gastroesophageal reflux disease)   . Menopause   . Ovarian cyst   . Migraine   . Urine, incontinence, stress female   . Adenomatous colon polyp     Past Surgical History  Procedure Laterality Date  . Total abdominal hysterectomy    . Tonsillectomy and adenoidectomy    . Nasal sinus surgery      x 2  . Eye lid  surgery    . Thrombosed hemorrohid lanced    . Coronary artery bypass graft      Family History  Problem Relation Age of Onset  . Coronary artery disease Other   . Heart disease Other     6 stents  . Heart disease Mother   . COPD Father   . Heart disease Father   . Heart disease Maternal Uncle     Social History History  Substance Use Topics  . Smoking status: Former Smoker    Quit date: 09/03/2005  . Smokeless tobacco: Not on file     Comment: quit in 2005  . Alcohol Use: No    Allergies  Allergen Reactions  . Codeine Sulfate Rash    REACTION: unspecified  . Hydrocodone-Acetaminophen Rash    REACTION: unspecified  . Amoxicillin-Pot Clavulanate     REACTION: unspecified    Current Outpatient Prescriptions  Medication Sig Dispense Refill  . albuterol (PROVENTIL HFA;VENTOLIN HFA) 108 (90 BASE) MCG/ACT inhaler Inhale 2 puffs into the lungs every 6 (six) hours as needed.      Marland Kitchen aspirin 81 MG tablet Take 81 mg by mouth daily.        . CRESTOR 40 MG tablet TAKE ONE TABLET BY MOUTH EVERY DAY  30 each  12  . desvenlafaxine (PRISTIQ) 100 MG 24 hr tablet Take 1 tablet (  100 mg total) by mouth daily.  30 tablet  6  . nadolol (CORGARD) 20 MG tablet Take 1 tablet (20 mg total) by mouth 2 (two) times daily.  60 tablet  9  . NEXIUM 40 MG capsule TAKE ONE CAPSULE BY MOUTH ONCE DAILY  90 capsule  3  . SPIRIVA HANDIHALER 18 MCG inhalation capsule INHALE ONE PUFF EVERY DAY  30 capsule  6  . VIVELLE-DOT 0.05 MG/24HR APPLY ONE PATCH TOPICALLY TWICE A WEEK. CHANGE TWICE A WEEK  10 each  11  . [DISCONTINUED] Aclidinium Bromide (TUDORZA PRESSAIR) 400 MCG/ACT AEPB Inhale 1 puff into the lungs 2 (two) times daily.  1 each  11   No current facility-administered medications for this visit.    Review of Systems Review of Systems  Constitutional: Negative for fever, chills and unexpected weight change.  HENT: Negative for hearing loss, congestion, sore throat, trouble swallowing and voice  change.   Eyes: Negative for visual disturbance.  Respiratory: Negative for cough and wheezing.   Cardiovascular: Negative for chest pain, palpitations and leg swelling.  Gastrointestinal: Positive for nausea, vomiting, abdominal pain and abdominal distention. Negative for diarrhea, constipation, blood in stool and anal bleeding.  Genitourinary: Negative for hematuria, vaginal bleeding and difficulty urinating.  Musculoskeletal: Negative for arthralgias.  Skin: Negative for rash and wound.  Neurological: Negative for seizures, syncope and headaches.  Hematological: Negative for adenopathy. Does not bruise/bleed easily.  Psychiatric/Behavioral: Negative for confusion.    Blood pressure 136/80, pulse 68, temperature 99.5 F (37.5 C), temperature source Temporal, resp. rate 15, height 5' 2.5" (1.588 m), weight 149 lb 6.4 oz (67.767 kg).  Physical Exam Physical Exam WDWN in NAD HEENT:  EOMI, sclera anicteric Neck:  No masses, no thyromegaly Lungs:  CTA bilaterally; normal respiratory effort CV:  Regular rate and rhythm; no murmurs Abd:  +bowel sounds, soft, tender in RUQ; no palpable masses Ext:  Well-perfused; no edema Skin:  Warm, dry; no sign of jaundice  Data Reviewed RADIOLOGY REPORT*  Clinical Data: Abdominal epigastric pain.  COMPLETE ABDOMINAL ULTRASOUND  Comparison: None.  Findings:  Gallbladder: Sludge and a probable 1.1 cm stone are seen in the  gallbladder. Wall measures 4 mm. Negative sonographic Murphy's  sign. No pericholecystic fluid.  Common bile duct: Measures 7 mm, slightly prominent for age.  Liver: A 1 cm hyperechoic lesion in the left hepatic lobe may  represent a hemangioma. Parenchymal echogenicity is otherwise  uniform.  IVC: Appears normal.  Pancreas: Visualization of the tail is limited by bowel gas.  Otherwise negative.  Spleen: Measures 8.2 cm, negative.  Right Kidney: Measures 11.0 cm. Parenchymal echogenicity is  normal. No hydronephrosis. No  focal lesion.  Left Kidney: Measures 10.1 cm. Parenchymal echogenicity is  normal. No hydronephrosis. No focal lesion.  Abdominal aorta: No aneurysm identified.  IMPRESSION:  Sludge and a probable stone in the gallbladder with minimal wall  thickening. Negative sonographic Murphy's sign. Findings can be  seen with chronic cholecystitis.  Original Report Authenticated By: Leanna Battles, M.D.  Lab Results  Component Value Date   WBC 5.0 01/30/2013   HGB 13.0 01/30/2013   HCT 38.7 01/30/2013   MCV 91.4 01/30/2013   PLT 170.0 01/30/2013   Lab Results  Component Value Date   ALT 34 01/30/2013   AST 32 01/30/2013   ALKPHOS 43 01/30/2013   BILITOT 0.4 01/30/2013   Lab Results  Component Value Date   CREATININE 0.6 01/30/2013   BUN 9 01/30/2013   NA  133* 01/30/2013   K 5.6* 01/30/2013   CL 100 01/30/2013   CO2 29 01/30/2013     Assessment    Chronic calculus cholecystitis     Plan    Laparoscopic cholecystectomy with intraoperative cholangiogram. The surgical procedure has been discussed with the patient.  Potential risks, benefits, alternative treatments, and expected outcomes have been explained.  All of the patient's questions at this time have been answered.  The likelihood of reaching the patient's treatment goal is good.  The patient understand the proposed surgical procedure and wishes to proceed. We will first obtain cardiac clearance from Dr. Meta Hatchet K. 03/16/2013, 4:13 PM

## 2013-03-17 ENCOUNTER — Telehealth (INDEPENDENT_AMBULATORY_CARE_PROVIDER_SITE_OTHER): Payer: Self-pay | Admitting: General Surgery

## 2013-03-17 ENCOUNTER — Encounter: Payer: Self-pay | Admitting: Nurse Practitioner

## 2013-03-17 ENCOUNTER — Ambulatory Visit (INDEPENDENT_AMBULATORY_CARE_PROVIDER_SITE_OTHER): Payer: BC Managed Care – PPO | Admitting: Nurse Practitioner

## 2013-03-17 ENCOUNTER — Other Ambulatory Visit: Payer: Self-pay

## 2013-03-17 VITALS — BP 130/84 | HR 62 | Ht 62.5 in | Wt 148.0 lb

## 2013-03-17 DIAGNOSIS — I2510 Atherosclerotic heart disease of native coronary artery without angina pectoris: Secondary | ICD-10-CM

## 2013-03-17 DIAGNOSIS — Z01818 Encounter for other preprocedural examination: Secondary | ICD-10-CM

## 2013-03-17 MED ORDER — ROSUVASTATIN CALCIUM 40 MG PO TABS: ORAL_TABLET | ORAL | Status: DC

## 2013-03-17 NOTE — Progress Notes (Addendum)
Katherine Foley Date of Birth: 10/24/1950 Medical Record #[id number]  History of Present Illness: Katherine Foley is seen today for a surgical clearance visit. Seen for Dr. Excell Seltzer. Has known CAD with past NSTEMI in 2011 treated with BMS of the RCA and intervention with DES of a high grade stenosis of the LCX in April of 2012. Has had AS and underwent AVR with a bioprosthesis in 2012 complicated by left hydrothorax requiring a VATS. Her cath at that time in August of 2012 showed her stents to be patent. Other issues include HLD, GERD, COPD, anxiety and depression.   Last seen here in October of 2012 and was felt to be doing well. BP control was suboptimal - ARB was started. She did not keep her follow up.   Comes in today. Here alone. Wants to have her gallbladder out. Has had abdominal pain with nausea - gallstones noted on ultrasound. She says she is doing well otherwise. No chest pain. Not short of breath. Walking regularly without issue. Remains very active. No dizzy or passing out spells. BP has been well controlled. She is now just on low dose Aspirin.    Current Outpatient Prescriptions  Medication Sig Dispense Refill  . albuterol (PROVENTIL HFA;VENTOLIN HFA) 108 (90 BASE) MCG/ACT inhaler Inhale 2 puffs into the lungs every 6 (six) hours as needed.      Marland Kitchen aspirin 81 MG tablet Take 81 mg by mouth daily.        . CRESTOR 40 MG tablet TAKE ONE TABLET BY MOUTH EVERY DAY  30 each  12  . desvenlafaxine (PRISTIQ) 100 MG 24 hr tablet Take 1 tablet (100 mg total) by mouth daily.  30 tablet  6  . nadolol (CORGARD) 20 MG tablet Take 1 tablet (20 mg total) by mouth 2 (two) times daily.  60 tablet  9  . NEXIUM 40 MG capsule TAKE ONE CAPSULE BY MOUTH ONCE DAILY  90 capsule  3  . SPIRIVA HANDIHALER 18 MCG inhalation capsule INHALE ONE PUFF EVERY DAY  30 capsule  6  . traMADol (ULTRAM) 50 MG tablet Take 50 mg by mouth every 8 (eight) hours as needed.       Marland Kitchen VIVELLE-DOT 0.05 MG/24HR APPLY ONE PATCH TOPICALLY  TWICE A WEEK. CHANGE TWICE A WEEK  10 each  11  . [DISCONTINUED] Aclidinium Bromide (TUDORZA PRESSAIR) 400 MCG/ACT AEPB Inhale 1 puff into the lungs 2 (two) times daily.  1 each  11   No current facility-administered medications for this visit.    Allergies  Allergen Reactions  . Codeine Sulfate Rash    REACTION: unspecified  . Hydrocodone-Acetaminophen Rash    REACTION: unspecified  . Amoxicillin-Pot Clavulanate     REACTION: unspecified    Past Medical History  Diagnosis Date  . ANXIETY DEPRESSION 02/15/2009  . AORTIC STENOSIS 07/07/2010    a. echo 12/23/10: EF 60-65%, mod to severe AS with mean gradient 29 mmHg  . Barrett's esophagus 04/14/2007  . CAD, NATIVE VESSEL 05/17/2010    a. NSTEMI 8/11 - BMS to RCA;  b. cath 4/12: dLAD 50%, RI 80% (PCI), AVCFX 30%, pRCA 30%, stent ok, 40-50, then 50% dist RCA;  c. s/p Promus DES to RI 12/25/10  . Carotid stenosis 05/17/2010    a. dopplers 11/11: RICA 40-50%; LICA 0-39% (follow up due 11/12)  . 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 Herpes simplex without mention of complication 01/16/2010  . HYPERLIPIDEMIA, MILD 08/29/2009  . HYPERTENSION 04/14/2007  .  MIGRAINE HEADACHE 04/20/2008  . OSTEOPENIA 04/14/2007  . URINARY INCONTINENCE, STRESS, MILD 08/26/2007  . GERD (gastroesophageal reflux disease)   . Menopause   . Ovarian cyst   . Migraine   . Urine, incontinence, stress female   . Adenomatous colon polyp     Past Surgical History  Procedure Laterality Date  . Total abdominal hysterectomy    . Tonsillectomy and adenoidectomy    . Nasal sinus surgery      x 2  . Eye lid surgery    . Thrombosed hemorrohid lanced    . Coronary artery bypass graft      History  Smoking status  . Former Smoker  . Quit date: 09/03/2005  Smokeless tobacco  . Not on file    Comment: quit in 2005    History  Alcohol Use No    Family History  Problem Relation Age of Onset  . Coronary artery disease Other   . Heart disease  Other     6 stents  . Heart disease Mother   . COPD Father   . Heart disease Father   . Heart disease Maternal Uncle     Review of Systems: The review of systems is per the HPI.  All other systems were reviewed and are negative.  Physical Exam: BP 130/84  Pulse 62  Ht 5' 2.5" (1.588 m)  Wt 148 lb (67.132 kg)  BMI 26.62 kg/m2 Patient is very pleasant and in no acute distress. Skin is warm and dry. Color is normal.  HEENT is unremarkable. Normocephalic/atraumatic. PERRL. Sclera are nonicteric. Neck is supple. No masses. No JVD. Lungs are clear. Cardiac exam shows a regular rate and rhythm. No murmur noted.  Abdomen is soft. Extremities are without edema. Gait and ROM are intact. No gross neurologic deficits noted.  LABORATORY DATA: EKG shows sinus rhythm and is normal.   Lab Results  Component Value Date   WBC 5.0 01/30/2013   HGB 13.0 01/30/2013   HCT 38.7 01/30/2013   PLT 170.0 01/30/2013   GLUCOSE 97 01/30/2013   CHOL 137 01/30/2013   TRIG 70.0 01/30/2013   HDL 57.00 01/30/2013   LDLDIRECT 73.3 10/02/2010   LDLCALC 66 01/30/2013   ALT 34 01/30/2013   AST 32 01/30/2013   NA 133* 01/30/2013   K 5.6* 01/30/2013   CL 100 01/30/2013   CREATININE 0.6 01/30/2013   BUN 9 01/30/2013   CO2 29 01/30/2013   TSH 0.75 01/30/2013   INR 1.16 05/21/2011   HGBA1C 6.2* 04/27/2011   Echo Study Conclusions from December 2012  - Left ventricle: The cavity size was normal. Wall thickness was normal. Systolic function was normal. The estimated ejection fraction was in the range of 55% to 60%. - Aortic valve: Prosthetic AVR well seated with no perivalvular leak or AR. Normal gradients - Mitral valve: Mild regurgitation. - Left atrium: The atrium was mildly dilated. - Right atrium: The atrium was mildly dilated. - Atrial septum: No defect or patent foramen ovale was identified.   Assessment / Plan: 1. CAD - with past NSTEMI and prior BMS to the RCA and DES to the LCX - she is doing well clinically  without symptoms. I think she is an acceptable candidate for her cholecystectomy.   2. AS - s/p AVR - last echo was in December of 2012 and looked good. May consider repeating on return visit.   3. HTN - good BP control.   4. Chronic gallbladder disease   Will have  Dr. Excell Seltzer review as well. She is just on aspirin therapy - will ask his recommendation for stopping given her CAD and AVR. Otherwise, she is cleared for surgery and felt to be low risk.   Patient is agreeable to this plan and will call if any problems develop in the interim.   Rosalio Macadamia, RN, ANP-C [facility] [address] [place], [place]  [address]   Addendum: Dr. Excell Seltzer has reviewed and recommends holding aspirin for 5 days. Patient will be notified.

## 2013-03-17 NOTE — Patient Instructions (Addendum)
I think you are an acceptable candidate for your gallbladder surgery  Dr. Excell Seltzer will see you in 6 months - will consider repeat echo at that time  Stay on your current medicines  Call the [facility] office at [phone] if you have any questions, problems or concerns.

## 2013-03-17 NOTE — Telephone Encounter (Signed)
Patient called back and I told her that we had gotten her cardiac clearance and that I will putting her orders up to the schedulers and they will give her a call once they call her and schedule her surgery she will need to hold her ASA 5 days before surgery

## 2013-03-17 NOTE — Telephone Encounter (Signed)
LMOM for patient to call back and ask for Jamieka Royle 

## 2013-03-18 ENCOUNTER — Telehealth: Payer: Self-pay | Admitting: *Deleted

## 2013-03-18 NOTE — Telephone Encounter (Signed)
Pt aware to hold Asprin 5 days prior to surgery

## 2013-03-20 ENCOUNTER — Encounter (HOSPITAL_COMMUNITY): Payer: Self-pay

## 2013-03-20 ENCOUNTER — Encounter (HOSPITAL_COMMUNITY): Payer: Self-pay | Admitting: Pharmacy Technician

## 2013-03-20 NOTE — Pre-Procedure Instructions (Addendum)
FRANNIE SHEDRICK  03/20/2013   Your procedure is scheduled on:  Wednesday, July 23rd.  Report to [facility] at 1:00 PM.  Call this number if you have problems the morning of surgery: [phone]   Remember:   Do not eat food or drink liquids after midnight.   Take these medicines the morning of surgery with A SIP OF WATER: esomeprazole (NEXIUM), nadolol (Corgard).  Use  Inhaler, bring albuterol (PROVENTIL HFA;VENTOLIN HFA)  To the hospital with you.  Take if needed:traMADol (ULTRAM)     Do not wear jewelry, make-up or nail polish.  Do not wear lotions, powders, or perfumes. You may wear deodorant.  Do not shave 48 hours prior to surgery.  Do not bring valuables to the hospital.  [facility] is not responsible for any belongings or valuables.  Contacts, dentures or bridgework may not be worn into surgery.  Leave suitcase in the car. After surgery it may be brought to your room.  For patients admitted to the hospital, checkout time is 11:00 AM the day of discharge.   Patients discharged the day of surgery will not be allowed to drive home.  Name and phone number of your driver: -    Special Instructions: Shower using CHG 2 nights before surgery and the night before surgery.  If you shower the day of surgery use CHG.  Use special wash - you have one bottle of CHG for all showers.  You should use approximately 1/3 of the bottle for each shower.   Please read over the following fact sheets that you were given: Pain Booklet, Coughing and Deep Breathing and Surgical Site Infection Prevention

## 2013-03-23 ENCOUNTER — Encounter (HOSPITAL_COMMUNITY)
Admission: RE | Admit: 2013-03-23 | Discharge: 2013-03-23 | Disposition: A | Discharge status: Home / Self Care | Payer: BC Managed Care – PPO | Source: Ambulatory Visit | Attending: Surgery | Admitting: Surgery

## 2013-03-23 ENCOUNTER — Encounter (HOSPITAL_COMMUNITY): Payer: Self-pay

## 2013-03-23 MED ORDER — CHLORHEXIDINE GLUCONATE 4 % EX LIQD: 1.0000 "application " | Freq: Once | CUTANEOUS | Status: DC

## 2013-03-23 NOTE — Progress Notes (Signed)
Repeat labs DOS, per [facility] blood work sent, not found in main lab.

## 2013-03-24 MED ORDER — CIPROFLOXACIN IN D5W 400 MG/200ML IV SOLN
400.0000 mg | INTRAVENOUS | Status: AC
  Administered 2013-03-25: 400 mg via INTRAVENOUS
  Filled 2013-03-24: qty 200

## 2013-03-25 ENCOUNTER — Ambulatory Visit (HOSPITAL_COMMUNITY): Payer: BC Managed Care – PPO

## 2013-03-25 ENCOUNTER — Observation Stay (HOSPITAL_COMMUNITY)
Admission: RE | Admit: 2013-03-25 | Discharge: 2013-03-26 | Disposition: A | Discharge status: Home / Self Care | Payer: BC Managed Care – PPO | Source: Ambulatory Visit | Attending: Surgery | Admitting: Surgery

## 2013-03-25 ENCOUNTER — Encounter (HOSPITAL_COMMUNITY): Payer: Self-pay | Admitting: *Deleted

## 2013-03-25 ENCOUNTER — Encounter (HOSPITAL_COMMUNITY): Payer: Self-pay | Admitting: Anesthesiology

## 2013-03-25 ENCOUNTER — Ambulatory Visit (HOSPITAL_COMMUNITY): Payer: BC Managed Care – PPO | Admitting: Anesthesiology

## 2013-03-25 ENCOUNTER — Encounter (HOSPITAL_COMMUNITY)
Admission: RE | Disposition: A | Discharge status: Home / Self Care | Payer: Self-pay | Source: Ambulatory Visit | Attending: Surgery

## 2013-03-25 DIAGNOSIS — K8010 Calculus of gallbladder with chronic cholecystitis without obstruction: Principal | ICD-10-CM | POA: Insufficient documentation

## 2013-03-25 DIAGNOSIS — J449 Chronic obstructive pulmonary disease, unspecified: Secondary | ICD-10-CM | POA: Insufficient documentation

## 2013-03-25 DIAGNOSIS — J4489 Other specified chronic obstructive pulmonary disease: Secondary | ICD-10-CM | POA: Insufficient documentation

## 2013-03-25 DIAGNOSIS — I10 Essential (primary) hypertension: Secondary | ICD-10-CM | POA: Insufficient documentation

## 2013-03-25 DIAGNOSIS — Z79899 Other long term (current) drug therapy: Secondary | ICD-10-CM | POA: Insufficient documentation

## 2013-03-25 HISTORY — DX: Perianal venous thrombosis: K64.5

## 2013-03-25 HISTORY — DX: Shortness of breath: R06.02

## 2013-03-25 HISTORY — DX: Cardiac murmur, unspecified: R01.1

## 2013-03-25 HISTORY — DX: Personal history of other medical treatment: Z92.89

## 2013-03-25 HISTORY — DX: Unspecified chronic bronchitis: J42

## 2013-03-25 HISTORY — DX: Personal history of other diseases of the digestive system: Z87.19

## 2013-03-25 HISTORY — DX: Pneumonia, unspecified organism: J18.9

## 2013-03-25 HISTORY — PX: CHOLECYSTECTOMY: SHX55

## 2013-03-25 HISTORY — DX: Acute myocardial infarction, unspecified: I21.9

## 2013-03-25 LAB — BASIC METABOLIC PANEL
CO2: 27 mEq/L (ref 19–32)
Calcium: 8.9 mg/dL (ref 8.4–10.5)
Glucose, Bld: 95 mg/dL (ref 70–99)
Potassium: 4.6 mEq/L (ref 3.5–5.1)
Sodium: 136 mEq/L (ref 135–145)

## 2013-03-25 LAB — CBC
Hemoglobin: 11.7 g/dL — ABNORMAL LOW (ref 12.0–15.0)
MCH: 29.8 pg (ref 26.0–34.0)
MCV: 89.5 fL (ref 78.0–100.0)
RBC: 3.92 MIL/uL (ref 3.87–5.11)

## 2013-03-25 SURGERY — LAPAROSCOPIC CHOLECYSTECTOMY WITH INTRAOPERATIVE CHOLANGIOGRAM
Anesthesia: General | Site: Abdomen | Wound class: Contaminated

## 2013-03-25 MED ORDER — BUPIVACAINE-EPINEPHRINE PF 0.25-1:200000 % IJ SOLN
INTRAMUSCULAR | Status: AC
  Filled 2013-03-25: qty 30

## 2013-03-25 MED ORDER — LIDOCAINE HCL (CARDIAC) 20 MG/ML IV SOLN
INTRAVENOUS | Status: DC | PRN
  Administered 2013-03-25: 80 mg via INTRAVENOUS

## 2013-03-25 MED ORDER — SODIUM CHLORIDE 0.9 % IV SOLN
INTRAVENOUS | Status: DC
  Administered 2013-03-25: 17:00:00 via INTRAVENOUS

## 2013-03-25 MED ORDER — SODIUM CHLORIDE 0.9 % IV SOLN
INTRAVENOUS | Status: DC | PRN
  Administered 2013-03-25: 13:00:00

## 2013-03-25 MED ORDER — MORPHINE SULFATE 2 MG/ML IJ SOLN: 2.0000 mg | INTRAMUSCULAR | Status: DC | PRN

## 2013-03-25 MED ORDER — HYDROMORPHONE HCL PF 1 MG/ML IJ SOLN
0.2500 mg | INTRAMUSCULAR | Status: DC | PRN
  Administered 2013-03-25 (×4): 0.5 mg via INTRAVENOUS

## 2013-03-25 MED ORDER — OXYCODONE-ACETAMINOPHEN 5-325 MG PO TABS
1.0000 | ORAL_TABLET | ORAL | Status: DC | PRN
  Administered 2013-03-25 – 2013-03-26 (×2): 2 via ORAL
  Filled 2013-03-25 (×2): qty 2

## 2013-03-25 MED ORDER — BUPIVACAINE-EPINEPHRINE 0.25% -1:200000 IJ SOLN
INTRAMUSCULAR | Status: DC | PRN
  Administered 2013-03-25: 9 mL

## 2013-03-25 MED ORDER — CIPROFLOXACIN IN D5W 400 MG/200ML IV SOLN
400.0000 mg | Freq: Two times a day (BID) | INTRAVENOUS | Status: AC
  Administered 2013-03-25: 400 mg via INTRAVENOUS
  Filled 2013-03-25: qty 200

## 2013-03-25 MED ORDER — EPHEDRINE SULFATE 50 MG/ML IJ SOLN
INTRAMUSCULAR | Status: DC | PRN
  Administered 2013-03-25 (×2): 5 mg via INTRAVENOUS
  Administered 2013-03-25: 10 mg via INTRAVENOUS

## 2013-03-25 MED ORDER — NADOLOL 20 MG PO TABS
20.0000 mg | ORAL_TABLET | Freq: Two times a day (BID) | ORAL | Status: DC
  Administered 2013-03-25 – 2013-03-26 (×2): 20 mg via ORAL
  Filled 2013-03-25 (×3): qty 1

## 2013-03-25 MED ORDER — ONDANSETRON HCL 4 MG/2ML IJ SOLN: 4.0000 mg | Freq: Four times a day (QID) | INTRAMUSCULAR | Status: DC | PRN

## 2013-03-25 MED ORDER — ONDANSETRON HCL 4 MG/2ML IJ SOLN
INTRAMUSCULAR | Status: DC | PRN
  Administered 2013-03-25: 4 mg via INTRAVENOUS

## 2013-03-25 MED ORDER — 0.9 % SODIUM CHLORIDE (POUR BTL) OPTIME
TOPICAL | Status: DC | PRN
  Administered 2013-03-25: 1000 mL

## 2013-03-25 MED ORDER — OXYCODONE HCL 5 MG/5ML PO SOLN: 5.0000 mg | Freq: Once | ORAL | Status: DC | PRN

## 2013-03-25 MED ORDER — PROPOFOL 10 MG/ML IV BOLUS
INTRAVENOUS | Status: DC | PRN
  Administered 2013-03-25: 160 mg via INTRAVENOUS

## 2013-03-25 MED ORDER — ALBUTEROL SULFATE HFA 108 (90 BASE) MCG/ACT IN AERS
2.0000 | INHALATION_SPRAY | Freq: Four times a day (QID) | RESPIRATORY_TRACT | Status: DC | PRN
  Filled 2013-03-25: qty 6.7

## 2013-03-25 MED ORDER — LACTATED RINGERS IV SOLN
INTRAVENOUS | Status: DC
  Administered 2013-03-25: 13:00:00 via INTRAVENOUS

## 2013-03-25 MED ORDER — ATORVASTATIN CALCIUM 80 MG PO TABS
80.0000 mg | ORAL_TABLET | Freq: Every day | ORAL | Status: DC
  Administered 2013-03-25: 80 mg via ORAL
  Filled 2013-03-25 (×2): qty 1

## 2013-03-25 MED ORDER — DESVENLAFAXINE SUCCINATE ER 100 MG PO TB24: 100.0000 mg | ORAL_TABLET | Freq: Every day | ORAL | Status: DC

## 2013-03-25 MED ORDER — OXYCODONE HCL 5 MG PO TABS: 5.0000 mg | ORAL_TABLET | Freq: Once | ORAL | Status: DC | PRN

## 2013-03-25 MED ORDER — LACTATED RINGERS IV SOLN
INTRAVENOUS | Status: DC | PRN
  Administered 2013-03-25: 13:00:00 via INTRAVENOUS

## 2013-03-25 MED ORDER — ZOLPIDEM TARTRATE 5 MG PO TABS
5.0000 mg | ORAL_TABLET | Freq: Every evening | ORAL | Status: DC | PRN
  Administered 2013-03-25: 5 mg via ORAL
  Filled 2013-03-25: qty 1

## 2013-03-25 MED ORDER — ROCURONIUM BROMIDE 100 MG/10ML IV SOLN
INTRAVENOUS | Status: DC | PRN
  Administered 2013-03-25: 50 mg via INTRAVENOUS

## 2013-03-25 MED ORDER — DIPHENHYDRAMINE HCL 50 MG/ML IJ SOLN: 12.5000 mg | Freq: Four times a day (QID) | INTRAMUSCULAR | Status: DC | PRN

## 2013-03-25 MED ORDER — FENTANYL CITRATE 0.05 MG/ML IJ SOLN
INTRAMUSCULAR | Status: DC | PRN
  Administered 2013-03-25 (×3): 50 ug via INTRAVENOUS

## 2013-03-25 MED ORDER — PANTOPRAZOLE SODIUM 40 MG PO TBEC: 80.0000 mg | DELAYED_RELEASE_TABLET | Freq: Every day | ORAL | Status: DC

## 2013-03-25 MED ORDER — ONDANSETRON HCL 4 MG PO TABS: 4.0000 mg | ORAL_TABLET | Freq: Four times a day (QID) | ORAL | Status: DC | PRN

## 2013-03-25 MED ORDER — GLYCOPYRROLATE 0.2 MG/ML IJ SOLN
INTRAMUSCULAR | Status: DC | PRN
  Administered 2013-03-25: 0.6 mg via INTRAVENOUS

## 2013-03-25 MED ORDER — NEOSTIGMINE METHYLSULFATE 1 MG/ML IJ SOLN
INTRAMUSCULAR | Status: DC | PRN
  Administered 2013-03-25: 4 mg via INTRAVENOUS

## 2013-03-25 MED ORDER — HYDROMORPHONE HCL PF 1 MG/ML IJ SOLN
INTRAMUSCULAR | Status: AC
  Filled 2013-03-25: qty 1

## 2013-03-25 MED ORDER — VENLAFAXINE HCL ER 150 MG PO CP24
150.0000 mg | ORAL_CAPSULE | Freq: Every day | ORAL | Status: DC
  Administered 2013-03-25 – 2013-03-26 (×2): 150 mg via ORAL
  Filled 2013-03-25 (×2): qty 1

## 2013-03-25 MED ORDER — SODIUM CHLORIDE 0.9 % IR SOLN
Status: DC | PRN
  Administered 2013-03-25: 1000 mL

## 2013-03-25 MED ORDER — MIDAZOLAM HCL 5 MG/5ML IJ SOLN
INTRAMUSCULAR | Status: DC | PRN
  Administered 2013-03-25: 2 mg via INTRAVENOUS

## 2013-03-25 MED ORDER — TIOTROPIUM BROMIDE MONOHYDRATE 18 MCG IN CAPS
18.0000 ug | ORAL_CAPSULE | Freq: Every day | RESPIRATORY_TRACT | Status: DC
  Administered 2013-03-26: 18 ug via RESPIRATORY_TRACT
  Filled 2013-03-25: qty 5

## 2013-03-25 SURGICAL SUPPLY — 49 items
APL SKNCLS STERI-STRIP NONHPOA (GAUZE/BANDAGES/DRESSINGS) ×1
APPLIER CLIP ROT 10 11.4 M/L (STAPLE) ×2
APR CLP MED LRG 11.4X10 (STAPLE) ×1
BAG SPEC RTRVL LRG 6X4 10 (ENDOMECHANICALS) ×1
BENZOIN TINCTURE PRP APPL 2/3 (GAUZE/BANDAGES/DRESSINGS) ×2 IMPLANT
BLADE SURG ROTATE 9660 (MISCELLANEOUS) IMPLANT
CANISTER SUCTION 2500CC (MISCELLANEOUS) ×2 IMPLANT
CHLORAPREP W/TINT 26ML (MISCELLANEOUS) ×2 IMPLANT
CLIP APPLIE ROT 10 11.4 M/L (STAPLE) ×1 IMPLANT
CLOTH BEACON ORANGE TIMEOUT ST (SAFETY) ×2 IMPLANT
COVER MAYO STAND STRL (DRAPES) ×2 IMPLANT
COVER SURGICAL LIGHT HANDLE (MISCELLANEOUS) ×2 IMPLANT
DECANTER SPIKE VIAL GLASS SM (MISCELLANEOUS) ×2 IMPLANT
DRAPE C-ARM 42X72 X-RAY (DRAPES) ×2 IMPLANT
DRAPE UTILITY 15X26 W/TAPE STR (DRAPE) ×4 IMPLANT
DRSG TEGADERM 4X4.75 (GAUZE/BANDAGES/DRESSINGS) ×2 IMPLANT
ELECT REM PT RETURN 9FT ADLT (ELECTROSURGICAL) ×2
ELECTRODE REM PT RTRN 9FT ADLT (ELECTROSURGICAL) ×1 IMPLANT
FILTER SMOKE EVAC LAPAROSHD (FILTER) ×2 IMPLANT
GAUZE SPONGE 2X2 8PLY STRL LF (GAUZE/BANDAGES/DRESSINGS) ×1 IMPLANT
GLOVE BIO SURGEON STRL SZ7 (GLOVE) ×2 IMPLANT
GLOVE BIO SURGEON STRL SZ7.5 (GLOVE) ×2 IMPLANT
GLOVE BIOGEL PI IND STRL 6.5 (GLOVE) IMPLANT
GLOVE BIOGEL PI IND STRL 7.0 (GLOVE) IMPLANT
GLOVE BIOGEL PI IND STRL 7.5 (GLOVE) ×1 IMPLANT
GLOVE BIOGEL PI INDICATOR 6.5 (GLOVE) ×1
GLOVE BIOGEL PI INDICATOR 7.0 (GLOVE) ×1
GLOVE BIOGEL PI INDICATOR 7.5 (GLOVE) ×3
GLOVE SURG SS PI 7.0 STRL IVOR (GLOVE) ×1 IMPLANT
GOWN PREVENTION PLUS XLARGE (GOWN DISPOSABLE) ×1 IMPLANT
GOWN STRL NON-REIN LRG LVL3 (GOWN DISPOSABLE) ×7 IMPLANT
KIT BASIN OR (CUSTOM PROCEDURE TRAY) ×2 IMPLANT
KIT ROOM TURNOVER OR (KITS) ×2 IMPLANT
NS IRRIG 1000ML POUR BTL (IV SOLUTION) ×2 IMPLANT
PAD ARMBOARD 7.5X6 YLW CONV (MISCELLANEOUS) ×2 IMPLANT
POUCH SPECIMEN RETRIEVAL 10MM (ENDOMECHANICALS) ×2 IMPLANT
SCISSORS LAP 5X35 DISP (ENDOMECHANICALS) IMPLANT
SET CHOLANGIOGRAPH 5 50 .035 (SET/KITS/TRAYS/PACK) ×2 IMPLANT
SET IRRIG TUBING LAPAROSCOPIC (IRRIGATION / IRRIGATOR) ×2 IMPLANT
SLEEVE ENDOPATH XCEL 5M (ENDOMECHANICALS) ×2 IMPLANT
SPECIMEN JAR SMALL (MISCELLANEOUS) ×2 IMPLANT
SPONGE GAUZE 2X2 STER 10/PKG (GAUZE/BANDAGES/DRESSINGS) ×1
SUT MNCRL AB 4-0 PS2 18 (SUTURE) ×2 IMPLANT
TOWEL OR 17X24 6PK STRL BLUE (TOWEL DISPOSABLE) ×2 IMPLANT
TOWEL OR 17X26 10 PK STRL BLUE (TOWEL DISPOSABLE) ×2 IMPLANT
TRAY LAPAROSCOPIC (CUSTOM PROCEDURE TRAY) ×2 IMPLANT
TROCAR XCEL BLUNT TIP 100MML (ENDOMECHANICALS) ×2 IMPLANT
TROCAR XCEL NON-BLD 11X100MML (ENDOMECHANICALS) ×2 IMPLANT
TROCAR XCEL NON-BLD 5MMX100MML (ENDOMECHANICALS) ×2 IMPLANT

## 2013-03-25 NOTE — Preoperative (Signed)
Beta Blockers   Reason not to administer Beta Blockers:Not Applicable 

## 2013-03-25 NOTE — Transfer of Care (Signed)
Immediate Anesthesia Transfer of Care Note  Patient: Katherine Foley  Procedure(s) Performed: Procedure(s): LAPAROSCOPIC CHOLECYSTECTOMY WITH INTRAOPERATIVE CHOLANGIOGRAM (N/A)  Patient Location: PACU  Anesthesia Type:General  Level of Consciousness: awake, alert  and oriented  Airway & Oxygen Therapy: Patient Spontanous Breathing and Patient connected to nasal cannula oxygen  Post-op Assessment: Report given to PACU RN and Post -op Vital signs reviewed and stable  Post vital signs: Reviewed and stable  Complications: No apparent anesthesia complications

## 2013-03-25 NOTE — H&P (View-Only) (Signed)
Patient ID: Katherine Foley, female   DOB: 09/28/1950, 62 y.o.   MRN: [id number]  Chief Complaint  Patient presents with  . New Evaluation    eval abd pain/gb    HPI [name] is a 62 y.o. female.  Referred by Dr. Peter Kwiatkowski for evaluation of gallbladder disease PCP - Dr. John Jenkins  HPI This is a 62-year-old female with a history of coronary artery disease status post CABG and aortic valve replacement who presents with abdominal pain in her right upper quadrant that began on March 02, 2013.  This was associated with nausea and vomiting. The pain radiated through to her back around her right side. She has had similar episodes in the Past. She had an ultrasound which showed gallstones but no wall thickening. She presents now for surgical evaluation. Her cardiologist is Dr. Cooper.  Past Medical History  Diagnosis Date  . ANXIETY DEPRESSION 02/15/2009  . AORTIC STENOSIS 07/07/2010    a. echo 12/23/10: EF 60-65%, mod to severe AS with mean gradient 29 mmHg  . Barrett's esophagus 04/14/2007  . CAD, NATIVE VESSEL 05/17/2010    a. NSTEMI 8/11 - BMS to RCA;  b. cath 4/12: dLAD 50%, RI 80% (PCI), AVCFX 30%, pRCA 30%, stent ok, 40-50, then 50% dist RCA;  c. s/p Promus DES to RI 12/25/10  . Carotid stenosis 05/17/2010    a. dopplers 11/11: RICA 40-50%; LICA 0-39% (follow up due 11/12)  . 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 Herpes simplex without mention of complication 01/16/2010  . HYPERLIPIDEMIA, MILD 08/29/2009  . HYPERTENSION 04/14/2007  . MIGRAINE HEADACHE 04/20/2008  . OSTEOPENIA 04/14/2007  . URINARY INCONTINENCE, STRESS, MILD 08/26/2007  . GERD (gastroesophageal reflux disease)   . Menopause   . Ovarian cyst   . Migraine   . Urine, incontinence, stress female   . Adenomatous colon polyp     Past Surgical History  Procedure Laterality Date  . Total abdominal hysterectomy    . Tonsillectomy and adenoidectomy    . Nasal sinus surgery      x 2  . Eye lid  surgery    . Thrombosed hemorrohid lanced    . Coronary artery bypass graft      Family History  Problem Relation Age of Onset  . Coronary artery disease Other   . Heart disease Other     6 stents  . Heart disease Mother   . COPD Father   . Heart disease Father   . Heart disease Maternal Uncle     Social History History  Substance Use Topics  . Smoking status: Former Smoker    Quit date: 09/03/2005  . Smokeless tobacco: Not on file     Comment: quit in 2005  . Alcohol Use: No    Allergies  Allergen Reactions  . Codeine Sulfate Rash    REACTION: unspecified  . Hydrocodone-Acetaminophen Rash    REACTION: unspecified  . Amoxicillin-Pot Clavulanate     REACTION: unspecified    Current Outpatient Prescriptions  Medication Sig Dispense Refill  . albuterol (PROVENTIL HFA;VENTOLIN HFA) 108 (90 BASE) MCG/ACT inhaler Inhale 2 puffs into the lungs every 6 (six) hours as needed.      . aspirin 81 MG tablet Take 81 mg by mouth daily.        . CRESTOR 40 MG tablet TAKE ONE TABLET BY MOUTH EVERY DAY  30 each  12  . desvenlafaxine (PRISTIQ) 100 MG 24 hr tablet Take 1 tablet (  100 mg total) by mouth daily.  30 tablet  6  . nadolol (CORGARD) 20 MG tablet Take 1 tablet (20 mg total) by mouth 2 (two) times daily.  60 tablet  9  . NEXIUM 40 MG capsule TAKE ONE CAPSULE BY MOUTH ONCE DAILY  90 capsule  3  . SPIRIVA HANDIHALER 18 MCG inhalation capsule INHALE ONE PUFF EVERY DAY  30 capsule  6  . VIVELLE-DOT 0.05 MG/24HR APPLY ONE PATCH TOPICALLY TWICE A WEEK. CHANGE TWICE A WEEK  10 each  11  . [DISCONTINUED] Aclidinium Bromide (TUDORZA PRESSAIR) 400 MCG/ACT AEPB Inhale 1 puff into the lungs 2 (two) times daily.  1 each  11   No current facility-administered medications for this visit.    Review of Systems Review of Systems  Constitutional: Negative for fever, chills and unexpected weight change.  HENT: Negative for hearing loss, congestion, sore throat, trouble swallowing and voice  change.   Eyes: Negative for visual disturbance.  Respiratory: Negative for cough and wheezing.   Cardiovascular: Negative for chest pain, palpitations and leg swelling.  Gastrointestinal: Positive for nausea, vomiting, abdominal pain and abdominal distention. Negative for diarrhea, constipation, blood in stool and anal bleeding.  Genitourinary: Negative for hematuria, vaginal bleeding and difficulty urinating.  Musculoskeletal: Negative for arthralgias.  Skin: Negative for rash and wound.  Neurological: Negative for seizures, syncope and headaches.  Hematological: Negative for adenopathy. Does not bruise/bleed easily.  Psychiatric/Behavioral: Negative for confusion.    Blood pressure 136/80, pulse 68, temperature 99.5 F (37.5 C), temperature source Temporal, resp. rate 15, height 5' 2.5" (1.588 m), weight 149 lb 6.4 oz (67.767 kg).  Physical Exam Physical Exam WDWN in NAD HEENT:  EOMI, sclera anicteric Neck:  No masses, no thyromegaly Lungs:  CTA bilaterally; normal respiratory effort CV:  Regular rate and rhythm; no murmurs Abd:  +bowel sounds, soft, tender in RUQ; no palpable masses Ext:  Well-perfused; no edema Skin:  Warm, dry; no sign of jaundice  Data Reviewed RADIOLOGY REPORT*  Clinical Data: Abdominal epigastric pain.  COMPLETE ABDOMINAL ULTRASOUND  Comparison: None.  Findings:  Gallbladder: Sludge and a probable 1.1 cm stone are seen in the  gallbladder. Wall measures 4 mm. Negative sonographic Murphy's  sign. No pericholecystic fluid.  Common bile duct: Measures 7 mm, slightly prominent for age.  Liver: A 1 cm hyperechoic lesion in the left hepatic lobe may  represent a hemangioma. Parenchymal echogenicity is otherwise  uniform.  IVC: Appears normal.  Pancreas: Visualization of the tail is limited by bowel gas.  Otherwise negative.  Spleen: Measures 8.2 cm, negative.  Right Kidney: Measures 11.0 cm. Parenchymal echogenicity is  normal. No hydronephrosis. No  focal lesion.  Left Kidney: Measures 10.1 cm. Parenchymal echogenicity is  normal. No hydronephrosis. No focal lesion.  Abdominal aorta: No aneurysm identified.  IMPRESSION:  Sludge and a probable stone in the gallbladder with minimal wall  thickening. Negative sonographic Murphy's sign. Findings can be  seen with chronic cholecystitis.  Original Report Authenticated By: Melinda Blietz, M.D.  Lab Results  Component Value Date   WBC 5.0 01/30/2013   HGB 13.0 01/30/2013   HCT 38.7 01/30/2013   MCV 91.4 01/30/2013   PLT 170.0 01/30/2013   Lab Results  Component Value Date   ALT 34 01/30/2013   AST 32 01/30/2013   ALKPHOS 43 01/30/2013   BILITOT 0.4 01/30/2013   Lab Results  Component Value Date   CREATININE 0.6 01/30/2013   BUN 9 01/30/2013   NA   133* 01/30/2013   K 5.6* 01/30/2013   CL 100 01/30/2013   CO2 29 01/30/2013     Assessment    Chronic calculus cholecystitis     Plan    Laparoscopic cholecystectomy with intraoperative cholangiogram. The surgical procedure has been discussed with the patient.  Potential risks, benefits, alternative treatments, and expected outcomes have been explained.  All of the patient's questions at this time have been answered.  The likelihood of reaching the patient's treatment goal is good.  The patient understand the proposed surgical procedure and wishes to proceed. We will first obtain cardiac clearance from Dr. Cooper         Maisy Newport K. 03/16/2013, 4:13 PM    

## 2013-03-25 NOTE — Anesthesia Preprocedure Evaluation (Addendum)
Anesthesia Evaluation  Patient identified by MRN, date of birth, ID band Patient awake    Reviewed: Allergy & Precautions, H&P , NPO status , Patient's Chart, lab work & pertinent test results  History of Anesthesia Complications Negative for: history of anesthetic complications  Airway Mallampati: II TM Distance: >3 FB Neck ROM: full    Dental  (+) Edentulous Upper, Edentulous Lower and Dental Advisory Given   Pulmonary COPD COPD inhaler, former smoker,          Cardiovascular hypertension, Pt. on medications + CAD, + CABG and + Peripheral Vascular Disease  S/p AVR   Neuro/Psych  Headaches, PSYCHIATRIC DISORDERS Depression    GI/Hepatic Neg liver ROS, GERD-  Medicated and Controlled,  Endo/Other  negative endocrine ROS  Renal/GU negative Renal ROS     Musculoskeletal negative musculoskeletal ROS (+)   Abdominal   Peds  Hematology negative hematology ROS (+)   Anesthesia Other Findings   Reproductive/Obstetrics negative OB ROS                         Anesthesia Physical Anesthesia Plan  ASA: III  Anesthesia Plan: General   Post-op Pain Management:    Induction: Intravenous  Airway Management Planned: Oral ETT  Additional Equipment:   Intra-op Plan:   Post-operative Plan: Extubation in OR  Informed Consent: I have reviewed the patients History and Physical, chart, labs and discussed the procedure including the risks, benefits and alternatives for the proposed anesthesia with the patient or authorized representative who has indicated his/her understanding and acceptance.     Plan Discussed with: CRNA, Anesthesiologist and Surgeon  Anesthesia Plan Comments:         Anesthesia Quick Evaluation

## 2013-03-25 NOTE — Op Note (Signed)
Laparoscopic Cholecystectomy with IOC Procedure Note  Indications: This patient presents with symptomatic gallbladder disease and will undergo laparoscopic cholecystectomy.  Pre-operative Diagnosis: Calculus of gallbladder with other cholecystitis, without mention of obstruction  Post-operative Diagnosis: Same  Surgeon: Jaella Weinert K.   Assistants: none  Anesthesia: General endotracheal anesthesia  ASA Class: 2  Procedure Details  The patient was seen again in the Holding Room. The risks, benefits, complications, treatment options, and expected outcomes were discussed with the patient. The possibilities of reaction to medication, pulmonary aspiration, perforation of viscus, bleeding, recurrent infection, finding a normal gallbladder, the need for additional procedures, failure to diagnose a condition, the possible need to convert to an open procedure, and creating a complication requiring transfusion or operation were discussed with the patient. The likelihood of improving the patient's symptoms with return to their baseline status is good.  The patient and/or family concurred with the proposed plan, giving informed consent. The site of surgery properly noted. The patient was taken to Operating Room, identified as Katherine Foley and the procedure verified as Laparoscopic Cholecystectomy with Intraoperative Cholangiogram. A Time Out was held and the above information confirmed.  Prior to the induction of general anesthesia, antibiotic prophylaxis was administered. General endotracheal anesthesia was then administered and tolerated well. After the induction, the abdomen was prepped with Chloraprep and draped in the sterile fashion. The patient was positioned in the supine position.  Local anesthetic agent was injected into the skin near the umbilicus and an incision made. We dissected down to the abdominal fascia with blunt dissection.  The fascia was incised vertically and we entered the  peritoneal cavity bluntly.  A pursestring suture of 0-Vicryl was placed around the fascial opening.  The Hasson cannula was inserted and secured with the stay suture.  Pneumoperitoneum was then created with CO2 and tolerated well without any adverse changes in the patient's vital signs. An 11-mm port was placed in the subxiphoid position.  Two 5-mm ports were placed in the right upper quadrant. All skin incisions were infiltrated with a local anesthetic agent before making the incision and placing the trocars.   We positioned the patient in reverse Trendelenburg, tilted slightly to the patient's left.  The gallbladder was identified, the fundus grasped and retracted cephalad. The duodenum was adherent to the gallbladder.  Adhesions were lysed bluntly and with the electrocautery where indicated, taking care not to injure any adjacent organs or viscus. The infundibulum was grasped and retracted laterally, exposing the peritoneum overlying the triangle of Calot. This was then divided and exposed in a blunt fashion. A critical view of the cystic duct and cystic artery was obtained.  The cystic duct was clearly identified and bluntly dissected circumferentially. The cystic duct was ligated with a clip distally.   An incision was made in the cystic duct and the [facility] cholangiogram catheter introduced. The catheter was secured using a clip. A cholangiogram was then obtained which showed good visualization of the distal and proximal biliary tree with no sign of filling defects or obstruction.  Contrast flowed easily into the duodenum. The catheter was then removed.   The cystic duct was then ligated with clips and divided. The cystic artery was identified, dissected free, ligated with clips and divided as well.   The gallbladder was dissected from the liver bed in retrograde fashion with the electrocautery. The gallbladder was removed and placed in an Endocatch sac. The liver bed was irrigated and inspected. Hemostasis  was achieved with the electrocautery. Copious irrigation  was utilized and was repeatedly aspirated until clear.  The gallbladder and Endocatch sac were then removed through the umbilical port site.  The pursestring suture was used to close the umbilical fascia.    We again inspected the right upper quadrant for hemostasis.  Pneumoperitoneum was released as we removed the trocars.  4-0 Monocryl was used to close the skin.   Benzoin, steri-strips, and clean dressings were applied. The patient was then extubated and brought to the recovery room in stable condition. Instrument, sponge, and needle counts were correct at closure and at the conclusion of the case.   Findings: Cholecystitis with Cholelithiasis  Estimated Blood Loss: Minimal         Drains: none         Specimens: Gallbladder           Complications: None; patient tolerated the procedure well.         Disposition: PACU - hemodynamically stable.         Condition: stable  Wilmon Arms. Corliss Skains, MD, [facility] Surgery  General/ Trauma Surgery  03/25/2013 2:32 PM

## 2013-03-25 NOTE — Interval H&P Note (Signed)
History and Physical Interval Note:  03/25/2013 12:52 PM  Katherine Foley  has presented today for surgery, with the diagnosis of chronic calculus cholecystitis  The various methods of treatment have been discussed with the patient and family. After consideration of risks, benefits and other options for treatment, the patient has consented to  Procedure(s): LAPAROSCOPIC CHOLECYSTECTOMY WITH INTRAOPERATIVE CHOLANGIOGRAM (N/A) as a surgical intervention .  The patient's history has been reviewed, patient examined, no change in status, stable for surgery.  I have reviewed the patient's chart and labs.  Questions were answered to the patient's satisfaction.     Machai Desmith K.

## 2013-03-26 ENCOUNTER — Encounter (HOSPITAL_COMMUNITY): Payer: Self-pay | Admitting: Surgery

## 2013-03-26 MED ORDER — TRAMADOL HCL 50 MG PO TABS: 50.0000 mg | ORAL_TABLET | Freq: Three times a day (TID) | ORAL | Status: DC | PRN

## 2013-03-26 MED ORDER — OXYCODONE-ACETAMINOPHEN 5-325 MG PO TABS: 1.0000 | ORAL_TABLET | ORAL | Status: DC | PRN

## 2013-03-26 NOTE — Discharge Summary (Signed)
Katherine Foley. Corliss Skains, MD, [facility] Surgery  General/ Trauma Surgery  03/26/2013 11:04 AM

## 2013-03-26 NOTE — Progress Notes (Signed)
Patient discharged to home with instructions. 

## 2013-03-26 NOTE — Discharge Summary (Signed)
Patient ID: ZENITA KISTER MRN: [id number] DOB/AGE: 01/16/1951 62 y.o.  Admit date: 03/25/2013 Discharge date: 03/26/2013  Procedures: lap chole with IOC  Consults: None  Reason for Admission: This is a 62 year old female with a history of coronary artery disease status post CABG and aortic valve replacement who presents with abdominal pain in her right upper quadrant that began on March 02, 2013. This was associated with nausea and vomiting. The pain radiated through to her back around her right side. She has had similar episodes in the Past. She had an ultrasound which showed gallstones but no wall thickening. She presents now for surgical evaluation. Her cardiologist is Dr. Excell Seltzer.  Admission Diagnoses:  1. Chronic calculus cholecystitis Patient Active Problem List   Diagnosis Date Noted  . Chronic cholecystitis with calculus 03/16/2013  . AORTIC STENOSIS 07/07/2010  . CAD, NATIVE VESSEL 05/17/2010  . Carotid stenosis 05/17/2010  . HERPES SIMPLEX WITHOUT MENTION OF COMPLICATION 01/16/2010  . HYPERLIPIDEMIA, MILD 08/29/2009  . ANXIETY DEPRESSION 02/15/2009  . MIGRAINE HEADACHE 04/20/2008  . URINARY INCONTINENCE, STRESS, MILD 08/26/2007  . 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 HYPERTENSION 04/14/2007  . BARRETT'S ESOPHAGUS 04/14/2007  . MENOPAUSAL SYNDROME 04/14/2007  . OSTEOPENIA 04/14/2007    Hospital Course: the patient was admitted and taken to the OR for a lap chole with IOC.  She tolerated this procedure well.  She was kept over night for cardiac monitoring.  On POD#1, her pain was well controlled and she was tolerating a regular diet.  She was stable for dc home.  PE: Abd: soft, appropriately tender, +BS, ND, incisions c/d/i with some old drainage noted on her umbilical incision.  Discharge Diagnoses:  1. Chronic calculus cholecystitis Patient Active Problem List   Diagnosis Date Noted  . Chronic cholecystitis with calculus 03/16/2013  . AORTIC  STENOSIS 07/07/2010  . CAD, NATIVE VESSEL 05/17/2010  . Carotid stenosis 05/17/2010  . HERPES SIMPLEX WITHOUT MENTION OF COMPLICATION 01/16/2010  . HYPERLIPIDEMIA, MILD 08/29/2009  . ANXIETY DEPRESSION 02/15/2009  . MIGRAINE HEADACHE 04/20/2008  . URINARY INCONTINENCE, STRESS, MILD 08/26/2007  . 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 HYPERTENSION 04/14/2007  . BARRETT'S ESOPHAGUS 04/14/2007  . MENOPAUSAL SYNDROME 04/14/2007  . OSTEOPENIA 04/14/2007     Discharge Medications:   Medication List    STOP taking these medications       traMADol 50 MG tablet  Commonly known as:  ULTRAM      TAKE these medications       albuterol 108 (90 BASE) MCG/ACT inhaler  Commonly known as:  PROVENTIL HFA;VENTOLIN HFA  Inhale 2 puffs into the lungs every 6 (six) hours as needed.     aspirin 81 MG tablet   On FRIDAY  Take 81 mg by mouth daily.     desvenlafaxine 100 MG 24 hr tablet  Commonly known as:  PRISTIQ  Take 1 tablet (100 mg total) by mouth daily.     esomeprazole 40 MG capsule  Commonly known as:  NEXIUM  Take 40 mg by mouth daily.     estradiol 0.05 MG/24HR  Commonly known as:  VIVELLE-DOT  Place 1 patch onto the skin 2 (two) times a week.     nadolol 20 MG tablet  Commonly known as:  CORGARD  Take 1 tablet (20 mg total) by mouth 2 (two) times daily.     oxyCODONE-acetaminophen 5-325 MG per tablet  Commonly known as:  PERCOCET/ROXICET  Take 1-2 tablets by mouth every 4 (  four) hours as needed.     rosuvastatin 40 MG tablet  Commonly known as:  CRESTOR  Take 40 mg by mouth daily.     tiotropium 18 MCG inhalation capsule  Commonly known as:  SPIRIVA  Place 18 mcg into inhaler and inhale daily.        Discharge Instructions:     Follow-up Information   Follow up with TSUEI,MATTHEW K., MD. Schedule an appointment as soon as possible for a visit in 3 weeks.   Contact information:   [address] [place]  [address] [phone]       Signed: Letha Cape 03/26/2013, 9:23 AM

## 2013-03-26 NOTE — Anesthesia Postprocedure Evaluation (Signed)
  Anesthesia Post-op Note  Patient: Katherine Foley  Procedure(s) Performed: Procedure(s): LAPAROSCOPIC CHOLECYSTECTOMY WITH INTRAOPERATIVE CHOLANGIOGRAM (N/A)  Patient Location: Nursing Unit  Anesthesia Type:General  Level of Consciousness: awake, alert  and oriented  Airway and Oxygen Therapy: Patient Spontanous Breathing  Post-op Pain: mild  Post-op Assessment: Post-op Vital signs reviewed and Patient's Cardiovascular Status Stable  Post-op Vital Signs: Reviewed and stable  Complications: No apparent anesthesia complications

## 2013-04-01 ENCOUNTER — Ambulatory Visit: Payer: BC Managed Care – PPO | Admitting: Physician Assistant

## 2013-04-03 ENCOUNTER — Other Ambulatory Visit: Payer: Self-pay | Admitting: Internal Medicine

## 2013-04-14 ENCOUNTER — Other Ambulatory Visit: Payer: Self-pay | Admitting: Internal Medicine

## 2013-04-16 ENCOUNTER — Encounter (INDEPENDENT_AMBULATORY_CARE_PROVIDER_SITE_OTHER): Payer: Self-pay | Admitting: Surgery

## 2013-04-16 ENCOUNTER — Ambulatory Visit (INDEPENDENT_AMBULATORY_CARE_PROVIDER_SITE_OTHER): Payer: BC Managed Care – PPO | Admitting: Surgery

## 2013-04-16 VITALS — BP 138/82 | HR 68 | Resp 14 | Ht 62.5 in | Wt 147.6 lb

## 2013-04-16 DIAGNOSIS — K8010 Calculus of gallbladder with chronic cholecystitis without obstruction: Secondary | ICD-10-CM

## 2013-04-16 NOTE — Progress Notes (Addendum)
Status post laparoscopic cholecystectomy on 03/25/13 for chronic cholecystitis. The patient is doing quite well. She continues to have some chronic constipation but this was present before surgery. Appetite is good. She has open sores around her medial right upper quadrant port incision. Her incisions are all well-healed with no sign of infection. No mass palpated it the right upper quadrant medial incision. It is likely that she has some scar tissue in the rectus muscle.  She may resume full activity. Her job requires a lot of heavy lifting so she will be back to work 6 weeks after surgery. Followup as needed  Wilmon Arms. Corliss Skains, MD, [facility] Surgery  General/ Trauma Surgery  04/16/2013 3:31 PM

## 2013-05-21 ENCOUNTER — Ambulatory Visit (INDEPENDENT_AMBULATORY_CARE_PROVIDER_SITE_OTHER): Payer: BC Managed Care – PPO | Admitting: Family Medicine

## 2013-05-21 ENCOUNTER — Encounter: Payer: Self-pay | Admitting: Family Medicine

## 2013-05-21 VITALS — BP 152/90 | Temp 97.8°F | Wt 146.0 lb

## 2013-05-21 DIAGNOSIS — Z23 Encounter for immunization: Secondary | ICD-10-CM

## 2013-05-21 DIAGNOSIS — I10 Essential (primary) hypertension: Secondary | ICD-10-CM

## 2013-05-21 DIAGNOSIS — G43909 Migraine, unspecified, not intractable, without status migrainosus: Secondary | ICD-10-CM

## 2013-05-21 NOTE — Patient Instructions (Addendum)
-  stop taking over the counter analgesics (ibuprofen, aleve, tylenol, etc) - continue your aspirin - topical menthol instead  -increased nadolol (corgard) to twice per day  -keep a HA journal of bad headaches - and sleep, stresses, foods, etc - triggers  -follow up with Dr. Lovell Sheehan as scheduled in a few weeks or sooner if needed

## 2013-05-21 NOTE — Addendum Note (Signed)
Addended by: Azucena Freed on: 05/21/2013 02:06 PM   Modules accepted: Orders

## 2013-05-21 NOTE — Progress Notes (Signed)
Chief Complaint  Patient presents with  . Headache    HPI:  Katherine Foley is a 62 yo pt of Dr. Lovell Sheehan with long hx of headaches/migraines followed by PCP: -per PCP notes last time seen for this corgard increased to 20mg  bid -she reports: only takes once daily now and headaches returned last few months -she has a hx of daily headaches, improved on bid nadolol, but now back several times per week - takes aleve and OTC  -headaches are more R sided, has occ nausea, light and noise sensitivity, sometimes with aura -denies: change in headaches, vision changes, CP, SOB, fevers  HTN: -was on cozaar but with corgard increased in past was have lower BP so cozaar stopped  ROS: See pertinent positives and negatives per HPI.  Past Medical History  Diagnosis Date  . AORTIC STENOSIS 07/07/2010    a. echo 12/23/10: EF 60-65%, mod to severe AS with mean gradient 29 mmHg  . Barrett's esophagus 04/14/2007  . CAD, NATIVE VESSEL 05/17/2010    a. NSTEMI 8/11 - BMS to RCA;  b. cath 4/12: dLAD 50%, RI 80% (PCI), AVCFX 30%, pRCA 30%, stent ok, 40-50, then 50% dist RCA;  c. s/p Promus DES to RI 12/25/10  . Carotid stenosis 05/17/2010    a. dopplers 11/11: RICA 40-50%; LICA 0-39% (follow up due 11/12)  . 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 Herpes simplex without mention of complication 01/16/2010  . HYPERLIPIDEMIA, MILD 08/29/2009  . OSTEOPENIA 04/14/2007  . URINARY INCONTINENCE, STRESS, MILD 08/26/2007  . GERD (gastroesophageal reflux disease)   . Menopause   . Ovarian cyst   . Urine, incontinence, stress female   . Adenomatous colon polyp   . HYPERTENSION 04/14/2007  . Heart murmur   . Anginal pain   . Myocardial infarction 2011    "during catherization" (03/25/2013)  . Hemorrhoid thrombosis     "had it lanced" (03/25/2013)  . Pneumonia     "multiple times" (03/25/2013)  . Chronic bronchitis     "multiple times" (03/25/2013)  . Exertional shortness of breath     "related to COPD  problems" (03/25/2013)  . History of blood transfusion     'w/OHS" (03/25/2013)  . H/O hiatal hernia   . MIGRAINE HEADACHE 04/20/2008  . Migraine   . ANXIETY DEPRESSION 02/15/2009    Past Surgical History  Procedure Laterality Date  . Tonsillectomy and adenoidectomy    . Nasal sinus surgery  1997; 1998    "cust in maxillary sinus; put a stent in then removed it" (03/25/2013)  . Blepharoplasty Bilateral ~ 1998  . Thrombosed hemorrohid lanced    . Ovarian cyst removal Right 2011    "size of a soccer ball" (03/25/2013)  . Appendectomy    . Total abdominal hysterectomy    . Aortic valve replacement  2012  . Cardiac catheterization    . Coronary angioplasty with stent placement      "I have 2 stents" (03/25/2013)  . Cholecystectomy  03/25/2013    w/IOC (03/25/2013)  . Cholecystectomy N/A 03/25/2013    Procedure: LAPAROSCOPIC CHOLECYSTECTOMY WITH INTRAOPERATIVE CHOLANGIOGRAM;  Surgeon: Wilmon Arms. Corliss Skains, MD;  Location: [facility] OR;  Service: General;  Laterality: N/A;    Family History  Problem Relation Age of Onset  . Coronary artery disease Other   . Heart disease Other     6 stents  . Heart disease Mother   . COPD Father   . Heart disease Father   . Heart disease  Maternal Uncle     History   Social History  . Marital Status: Single    Spouse Name: N/A    Number of Children: N/A  . Years of Education: N/A   Social History Main Topics  . Smoking status: Former Smoker -- 1.50 packs/day for 43 years    Types: Cigarettes    Quit date: 09/03/2005  . Smokeless tobacco: Never Used  . Alcohol Use: Yes     Comment: 03/25/2013 "I have drank; very rare"  . Drug Use: No  . Sexual Activity: No   Other Topics Concern  . None   Social History Narrative  . None    Current outpatient prescriptions:albuterol (PROVENTIL HFA;VENTOLIN HFA) 108 (90 BASE) MCG/ACT inhaler, Inhale 2 puffs into the lungs every 6 (six) hours as needed., Disp: , Rfl: ;  aspirin 81 MG tablet, Take 81 mg by mouth  daily.  , Disp: , Rfl: ;  esomeprazole (NEXIUM) 40 MG capsule, Take 40 mg by mouth daily., Disp: , Rfl: ;  estradiol (VIVELLE-DOT) 0.05 MG/24HR, Place 1 patch onto the skin 2 (two) times a week., Disp: , Rfl:  nadolol (CORGARD) 20 MG tablet, Take 1 tablet (20 mg total) by mouth 2 (two) times daily., Disp: 60 tablet, Rfl: 9;  PRISTIQ 100 MG 24 hr tablet, TAKE ONE TABLET BY MOUTH DAILY, Disp: 30 tablet, Rfl: 6;  rosuvastatin (CRESTOR) 40 MG tablet, Take 40 mg by mouth daily., Disp: , Rfl: ;  SPIRIVA HANDIHALER 18 MCG inhalation capsule, INHALE ONE DOSE EVERY DAY, Disp: 30 capsule, Rfl: 3 tiotropium (SPIRIVA) 18 MCG inhalation capsule, Place 18 mcg into inhaler and inhale daily., Disp: , Rfl: ;  traMADol (ULTRAM) 50 MG tablet, Take 1 tablet (50 mg total) by mouth every 8 (eight) hours as needed for pain., Disp: 40 tablet, Rfl: 0;  [DISCONTINUED] Aclidinium Bromide (TUDORZA PRESSAIR) 400 MCG/ACT AEPB, Inhale 1 puff into the lungs 2 (two) times daily., Disp: 1 each, Rfl: 11  EXAM:  Filed Vitals:   05/21/13 1336  BP: 152/90  Temp: 97.8 F (36.6 C)    Body mass index is 26.26 kg/(m^2).  GENERAL: vitals reviewed and listed above, alert, oriented, appears well hydrated and in no acute distress  HEENT: atraumatic, conjunttiva clear, no obvious abnormalities on inspection of external nose and ears  NECK: no obvious masses on inspection  LUNGS: clear to auscultation bilaterally, no wheezes, rales or rhonchi, good air movement  CV: HRRR, no peripheral edema  MS: moves all extremities without noticeable abnormality  PSYCH: pleasant and cooperative, no obvious depression or anxiety  NEURO: CN II-XII grossly intact, PERRLA, finger to nose normal, gait normal  ASSESSMENT AND PLAN:  Discussed the following assessment and plan:  Migraine  Essential hypertension, benign  -normal neuro exam, HAs better in the past on 40mg  corgard daily - advised go back to this as BP up lately -advised to wean of  otc analgesics as may be contributing -HA journal -Patient advised to return or notify a doctor immediately if symptoms worsen or persist or new concerns arise.  Patient Instructions  -stop taking over the counter analgesics (ibuprofen, aleve, tylenol, etc) - continue your aspirin - topical menthol instead  -increased nadolol (corgard) to twice per day  -keep a HA journal of bad headaches - and sleep, stresses, foods, etc - triggers  -follow up with Dr. Lovell Sheehan as scheduled in a few weeks or sooner if needed     Kriste Basque R.

## 2013-06-08 ENCOUNTER — Encounter: Payer: Self-pay | Admitting: Internal Medicine

## 2013-06-08 ENCOUNTER — Ambulatory Visit (INDEPENDENT_AMBULATORY_CARE_PROVIDER_SITE_OTHER): Payer: BC Managed Care – PPO | Admitting: Internal Medicine

## 2013-06-08 VITALS — BP 140/88 | HR 76 | Temp 98.2°F | Resp 16 | Ht 62.5 in | Wt 148.0 lb

## 2013-06-08 DIAGNOSIS — I6521 Occlusion and stenosis of right carotid artery: Secondary | ICD-10-CM

## 2013-06-08 DIAGNOSIS — G43909 Migraine, unspecified, not intractable, without status migrainosus: Secondary | ICD-10-CM

## 2013-06-08 DIAGNOSIS — I10 Essential (primary) hypertension: Secondary | ICD-10-CM

## 2013-06-08 DIAGNOSIS — G44009 Cluster headache syndrome, unspecified, not intractable: Secondary | ICD-10-CM

## 2013-06-08 DIAGNOSIS — I6529 Occlusion and stenosis of unspecified carotid artery: Secondary | ICD-10-CM

## 2013-06-08 MED ORDER — BUTALBITAL-APAP-CAFFEINE 50-325-40 MG PO CAPS: 1.0000 | ORAL_CAPSULE | ORAL | Status: DC | PRN

## 2013-06-08 NOTE — Patient Instructions (Signed)
The patient is instructed to continue all medications as prescribed. Schedule followup with check out clerk upon leaving the clinic  

## 2013-06-08 NOTE — Progress Notes (Signed)
  Subjective:    Patient ID: Katherine Foley, female    DOB: 1951-07-22, 62 y.o.   MRN: [id number]  HPI  Increased migraine head ache types Cervical radiculopathy with posterior head ache  Review of Systems  Constitutional: Negative for activity change, appetite change and fatigue.  HENT: Negative for ear pain, congestion, neck pain, postnasal drip and sinus pressure.   Eyes: Negative for redness and visual disturbance.  Respiratory: Negative for cough, shortness of breath and wheezing.   Gastrointestinal: Negative for abdominal pain and abdominal distention.  Genitourinary: Negative for dysuria, frequency and menstrual problem.  Musculoskeletal: Negative for myalgias, joint swelling and arthralgias.  Skin: Negative for rash and wound.  Neurological: Negative for dizziness, weakness and headaches.  Hematological: Negative for adenopathy. Does not bruise/bleed easily.  Psychiatric/Behavioral: Negative for sleep disturbance and decreased concentration.       Objective:   Physical Exam  Constitutional: She is oriented to person, place, and time. She appears well-developed and well-nourished. No distress.  HENT:  Head: Normocephalic and atraumatic.  Right Ear: External ear normal.  Left Ear: External ear normal.  Nose: Nose normal.  Mouth/Throat: Oropharynx is clear and moist.  Eyes: Conjunctivae and EOM are normal. Pupils are equal, round, and reactive to light.  Neck: Normal range of motion. No JVD present. No tracheal deviation present. No thyromegaly present.  neck  Cardiovascular: Normal rate, regular rhythm, normal heart sounds and intact distal pulses.   No murmur heard. Pulmonary/Chest: Effort normal and breath sounds normal. She has no wheezes. She exhibits no tenderness.  Abdominal: Soft. Bowel sounds are normal.  Musculoskeletal: Normal range of motion. She exhibits no edema and no tenderness.  Lymphadenopathy:    She has no cervical adenopathy.  Neurological: She is  alert and oriented to person, place, and time. She has normal reflexes. No cranial nerve deficit.  Skin: Skin is warm and dry. She is not diaphoretic.  Psychiatric: She has a normal mood and affect. Her behavior is normal.          Assessment & Plan:  Cervical tension head ache Point injection Parafon forte prn

## 2013-06-29 ENCOUNTER — Encounter: Payer: Self-pay | Admitting: Internal Medicine

## 2013-06-29 ENCOUNTER — Ambulatory Visit (INDEPENDENT_AMBULATORY_CARE_PROVIDER_SITE_OTHER): Payer: BC Managed Care – PPO | Admitting: Internal Medicine

## 2013-06-29 VITALS — BP 130/80 | HR 66 | Temp 97.8°F | Resp 20 | Wt 149.0 lb

## 2013-06-29 DIAGNOSIS — S40029A Contusion of unspecified upper arm, initial encounter: Secondary | ICD-10-CM

## 2013-06-29 DIAGNOSIS — I10 Essential (primary) hypertension: Secondary | ICD-10-CM

## 2013-06-29 DIAGNOSIS — I2510 Atherosclerotic heart disease of native coronary artery without angina pectoris: Secondary | ICD-10-CM

## 2013-06-29 DIAGNOSIS — S40021A Contusion of right upper arm, initial encounter: Secondary | ICD-10-CM

## 2013-06-29 MED ORDER — TRAMADOL HCL 50 MG PO TABS: 50.0000 mg | ORAL_TABLET | Freq: Three times a day (TID) | ORAL | Status: DC | PRN

## 2013-06-29 NOTE — Patient Instructions (Signed)
Keep arm elevated as much as possible  Apply ice over the next 24 hours  Take pain medications as directed

## 2013-06-29 NOTE — Progress Notes (Signed)
Subjective:    Patient ID: Katherine Foley, female    DOB: 04-16-1951, 62 y.o.   MRN: [id number]  HPI 62 year old patient who has treated hypertension and coronary artery disease. She is on daily low-dose aspirin. Approximately  3 hours ago she missed the lower rung on a ladder and traumatized her right outer lower arm. It has been quite painful and associated with soft tissue swelling and ecchymoses. She has attempted to keep it elevated and applied ice  Past Medical History  Diagnosis Date  . AORTIC STENOSIS 07/07/2010    a. echo 12/23/10: EF 60-65%, mod to severe AS with mean gradient 29 mmHg  . Barrett's esophagus 04/14/2007  . CAD, NATIVE VESSEL 05/17/2010    a. NSTEMI 8/11 - BMS to RCA;  b. cath 4/12: dLAD 50%, RI 80% (PCI), AVCFX 30%, pRCA 30%, stent ok, 40-50, then 50% dist RCA;  c. s/p Promus DES to RI 12/25/10  . Carotid stenosis 05/17/2010    a. dopplers 11/11: RICA 40-50%; LICA 0-39% (follow up due 11/12)  . 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 Herpes simplex without mention of complication 01/16/2010  . HYPERLIPIDEMIA, MILD 08/29/2009  . OSTEOPENIA 04/14/2007  . URINARY INCONTINENCE, STRESS, MILD 08/26/2007  . GERD (gastroesophageal reflux disease)   . Menopause   . Ovarian cyst   . Urine, incontinence, stress female   . Adenomatous colon polyp   . HYPERTENSION 04/14/2007  . Heart murmur   . Anginal pain   . Myocardial infarction 2011    "during catherization" (03/25/2013)  . Hemorrhoid thrombosis     "had it lanced" (03/25/2013)  . Pneumonia     "multiple times" (03/25/2013)  . Chronic bronchitis     "multiple times" (03/25/2013)  . Exertional shortness of breath     "related to COPD problems" (03/25/2013)  . History of blood transfusion     'w/OHS" (03/25/2013)  . H/O hiatal hernia   . MIGRAINE HEADACHE 04/20/2008  . Migraine   . ANXIETY DEPRESSION 02/15/2009    History   Social History  . Marital Status: Single    Spouse Name: N/A    Number of  Children: N/A  . Years of Education: N/A   Occupational History  . Not on file.   Social History Main Topics  . Smoking status: Former Smoker -- 1.50 packs/day for 43 years    Types: Cigarettes    Quit date: 09/03/2005  . Smokeless tobacco: Never Used  . Alcohol Use: Yes     Comment: 03/25/2013 "I have drank; very rare"  . Drug Use: No  . Sexual Activity: No   Other Topics Concern  . Not on file   Social History Narrative  . No narrative on file    Past Surgical History  Procedure Laterality Date  . Tonsillectomy and adenoidectomy    . Nasal sinus surgery  1997; 1998    "cust in maxillary sinus; put a stent in then removed it" (03/25/2013)  . Blepharoplasty Bilateral ~ 1998  . Thrombosed hemorrohid lanced    . Ovarian cyst removal Right 2011    "size of a soccer ball" (03/25/2013)  . Appendectomy    . Total abdominal hysterectomy    . Aortic valve replacement  2012  . Cardiac catheterization    . Coronary angioplasty with stent placement      "I have 2 stents" (03/25/2013)  . Cholecystectomy  03/25/2013    w/IOC (03/25/2013)  . Cholecystectomy N/A 03/25/2013    Procedure:  LAPAROSCOPIC CHOLECYSTECTOMY WITH INTRAOPERATIVE CHOLANGIOGRAM;  Surgeon: Wilmon Arms. Corliss Skains, MD;  Location: [facility] OR;  Service: General;  Laterality: N/A;    Family History  Problem Relation Age of Onset  . Coronary artery disease Other   . Heart disease Other     6 stents  . Heart disease Mother   . COPD Father   . Heart disease Father   . Heart disease Maternal Uncle     Allergies  Allergen Reactions  . Codeine Sulfate Rash    REACTION: unspecified  . Hydrocodone-Acetaminophen Rash    REACTION: unspecified  . Amoxicillin-Pot Clavulanate     REACTION: unspecified    Current Outpatient Prescriptions on File Prior to Visit  Medication Sig Dispense Refill  . albuterol (PROVENTIL HFA;VENTOLIN HFA) 108 (90 BASE) MCG/ACT inhaler Inhale 2 puffs into the lungs every 6 (six) hours as needed.      Marland Kitchen  aspirin 81 MG tablet Take 81 mg by mouth daily.        . Butalbital-APAP-Caffeine 50-325-40 MG per capsule Take 1 capsule by mouth every 4 (four) hours as needed for pain.  30 capsule  0  . esomeprazole (NEXIUM) 40 MG capsule Take 40 mg by mouth daily.      Marland Kitchen estradiol (VIVELLE-DOT) 0.05 MG/24HR Place 1 patch onto the skin 2 (two) times a week.      . nadolol (CORGARD) 20 MG tablet Take 1 tablet (20 mg total) by mouth 2 (two) times daily.  60 tablet  9  . PRISTIQ 100 MG 24 hr tablet TAKE ONE TABLET BY MOUTH DAILY  30 tablet  6  . rosuvastatin (CRESTOR) 40 MG tablet Take 40 mg by mouth daily.      Marland Kitchen SPIRIVA HANDIHALER 18 MCG inhalation capsule INHALE ONE DOSE EVERY DAY  30 capsule  3  . tiotropium (SPIRIVA) 18 MCG inhalation capsule Place 18 mcg into inhaler and inhale daily.      . [DISCONTINUED] Aclidinium Bromide (TUDORZA PRESSAIR) 400 MCG/ACT AEPB Inhale 1 puff into the lungs 2 (two) times daily.  1 each  11   No current facility-administered medications on file prior to visit.    BP 130/80  Pulse 66  Temp(Src) 97.8 F (36.6 C) (Oral)  Resp 20  Wt 149 lb (67.586 kg)  BMI 26.8 kg/m2  SpO2 96%        Review of Systems  Constitutional: Negative.   HENT: Negative for congestion, dental problem, hearing loss, rhinorrhea, sinus pressure, sore throat and tinnitus.   Eyes: Negative for pain, discharge and visual disturbance.  Respiratory: Negative for cough and shortness of breath.   Cardiovascular: Negative for chest pain, palpitations and leg swelling.  Gastrointestinal: Negative for nausea, vomiting, abdominal pain, diarrhea, constipation, blood in stool and abdominal distention.  Genitourinary: Negative for dysuria, urgency, frequency, hematuria, flank pain, vaginal bleeding, vaginal discharge, difficulty urinating, vaginal pain and pelvic pain.  Musculoskeletal: Negative for arthralgias, gait problem and joint swelling.  Skin: Positive for wound. Negative for rash.   Neurological: Negative for dizziness, syncope, speech difficulty, weakness, numbness and headaches.  Hematological: Negative for adenopathy.  Psychiatric/Behavioral: Negative for behavioral problems, dysphoric mood and agitation. The patient is not nervous/anxious.        Objective:   Physical Exam  Constitutional: She appears well-developed and well-nourished. No distress.  Skin:  Soft tissue swelling anastomoses involving the right lower mid outer arm          Assessment & Plan:  Ecchymoses/hematoma right lower arm  We'll give a prescription for tramadol for pain. Will continue attempts at elevation and application of ice over the next 24 hours

## 2013-07-22 ENCOUNTER — Other Ambulatory Visit: Payer: Self-pay | Admitting: Internal Medicine

## 2013-07-27 ENCOUNTER — Ambulatory Visit (INDEPENDENT_AMBULATORY_CARE_PROVIDER_SITE_OTHER): Payer: BC Managed Care – PPO | Admitting: Family Medicine

## 2013-07-27 ENCOUNTER — Other Ambulatory Visit: Payer: Self-pay | Admitting: Internal Medicine

## 2013-07-27 VITALS — BP 140/90 | HR 68 | Temp 98.0°F

## 2013-07-27 DIAGNOSIS — J069 Acute upper respiratory infection, unspecified: Secondary | ICD-10-CM

## 2013-07-27 DIAGNOSIS — J441 Chronic obstructive pulmonary disease with (acute) exacerbation: Secondary | ICD-10-CM

## 2013-07-27 MED ORDER — DOXYCYCLINE HYCLATE 100 MG PO TABS: 100.0000 mg | ORAL_TABLET | Freq: Two times a day (BID) | ORAL | Status: DC

## 2013-07-27 MED ORDER — PREDNISONE 20 MG PO TABS: 40.0000 mg | ORAL_TABLET | Freq: Every day | ORAL | Status: DC

## 2013-07-27 NOTE — Progress Notes (Signed)
Pre visit review using our clinic review tool, if applicable. No additional management support is needed unless otherwise documented below in the visit note. 

## 2013-07-27 NOTE — Progress Notes (Signed)
Chief Complaint  Patient presents with  . Sinusitis  . Mouth Lesions    HPI:  -started: 1 week ago -symptoms:nasal congestion, sore throat, cough - but now for last few days has settle in chest with productive cough, chest tightness, mild SOB, wheezing, cold sore -denies:fever, SOB, NVD, tooth pain -has tried: albuterol once, her asthma medications -sick contacts/travel/risks: denies flu exposure, tick exposure or or Ebola risks -Hx of: asthma/COPD per her report ROS: See pertinent positives and negatives per HPI.  Past Medical History  Diagnosis Date  . AORTIC STENOSIS 07/07/2010    a. echo 12/23/10: EF 60-65%, mod to severe AS with mean gradient 29 mmHg  . Barrett's esophagus 04/14/2007  . CAD, NATIVE VESSEL 05/17/2010    a. NSTEMI 8/11 - BMS to RCA;  b. cath 4/12: dLAD 50%, RI 80% (PCI), AVCFX 30%, pRCA 30%, stent ok, 40-50, then 50% dist RCA;  c. s/p Promus DES to RI 12/25/10  . Carotid stenosis 05/17/2010    a. dopplers 11/11: RICA 40-50%; LICA 0-39% (follow up due 11/12)  . 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 Herpes simplex without mention of complication 01/16/2010  . HYPERLIPIDEMIA, MILD 08/29/2009  . OSTEOPENIA 04/14/2007  . URINARY INCONTINENCE, STRESS, MILD 08/26/2007  . GERD (gastroesophageal reflux disease)   . Menopause   . Ovarian cyst   . Urine, incontinence, stress female   . Adenomatous colon polyp   . HYPERTENSION 04/14/2007  . Heart murmur   . Anginal pain   . Myocardial infarction 2011    "during catherization" (03/25/2013)  . Hemorrhoid thrombosis     "had it lanced" (03/25/2013)  . Pneumonia     "multiple times" (03/25/2013)  . Chronic bronchitis     "multiple times" (03/25/2013)  . Exertional shortness of breath     "related to COPD problems" (03/25/2013)  . History of blood transfusion     'w/OHS" (03/25/2013)  . H/O hiatal hernia   . MIGRAINE HEADACHE 04/20/2008  . Migraine   . ANXIETY DEPRESSION 02/15/2009    Past Surgical  History  Procedure Laterality Date  . Tonsillectomy and adenoidectomy    . Nasal sinus surgery  1997; 1998    "cust in maxillary sinus; put a stent in then removed it" (03/25/2013)  . Blepharoplasty Bilateral ~ 1998  . Thrombosed hemorrohid lanced    . Ovarian cyst removal Right 2011    "size of a soccer ball" (03/25/2013)  . Appendectomy    . Total abdominal hysterectomy    . Aortic valve replacement  2012  . Cardiac catheterization    . Coronary angioplasty with stent placement      "I have 2 stents" (03/25/2013)  . Cholecystectomy  03/25/2013    w/IOC (03/25/2013)  . Cholecystectomy N/A 03/25/2013    Procedure: LAPAROSCOPIC CHOLECYSTECTOMY WITH INTRAOPERATIVE CHOLANGIOGRAM;  Surgeon: Wilmon Arms. Corliss Skains, MD;  Location: [facility] OR;  Service: General;  Laterality: N/A;    Family History  Problem Relation Age of Onset  . Coronary artery disease Other   . Heart disease Other     6 stents  . Heart disease Mother   . COPD Father   . Heart disease Father   . Heart disease Maternal Uncle     History   Social History  . Marital Status: Single    Spouse Name: N/A    Number of Children: N/A  . Years of Education: N/A   Social History Main Topics  . Smoking status: Former Smoker -- 1.50  packs/day for 43 years    Types: Cigarettes    Quit date: 09/03/2005  . Smokeless tobacco: Never Used  . Alcohol Use: Yes     Comment: 03/25/2013 "I have drank; very rare"  . Drug Use: No  . Sexual Activity: No   Other Topics Concern  . Not on file   Social History Narrative  . No narrative on file    Current outpatient prescriptions:albuterol (PROVENTIL HFA;VENTOLIN HFA) 108 (90 BASE) MCG/ACT inhaler, Inhale 2 puffs into the lungs every 6 (six) hours as needed., Disp: , Rfl: ;  aspirin 81 MG tablet, Take 81 mg by mouth daily.  , Disp: , Rfl: ;  Butalbital-APAP-Caffeine 50-325-40 MG per capsule, Take 1 capsule by mouth every 4 (four) hours as needed for pain., Disp: 30 capsule, Rfl: 0 esomeprazole  (NEXIUM) 40 MG capsule, Take 40 mg by mouth daily., Disp: , Rfl: ;  estradiol (VIVELLE-DOT) 0.05 MG/24HR, Place 1 patch onto the skin 2 (two) times a week., Disp: , Rfl: ;  nadolol (CORGARD) 20 MG tablet, TAKE ONE TABLET BY MOUTH TWICE DAILY, Disp: 60 tablet, Rfl: 11;  PRISTIQ 100 MG 24 hr tablet, TAKE ONE TABLET BY MOUTH DAILY, Disp: 30 tablet, Rfl: 6;  rosuvastatin (CRESTOR) 40 MG tablet, Take 40 mg by mouth daily., Disp: , Rfl:  tiotropium (SPIRIVA) 18 MCG inhalation capsule, Place 18 mcg into inhaler and inhale daily., Disp: , Rfl: ;  traMADol (ULTRAM) 50 MG tablet, Take 1 tablet (50 mg total) by mouth every 8 (eight) hours as needed for pain., Disp: 30 tablet, Rfl: 0;  doxycycline (VIBRA-TABS) 100 MG tablet, Take 1 tablet (100 mg total) by mouth 2 (two) times daily., Disp: 20 tablet, Rfl: 0 predniSONE (DELTASONE) 20 MG tablet, Take 2 tablets (40 mg total) by mouth daily with breakfast., Disp: 10 tablet, Rfl: 0;  [DISCONTINUED] Aclidinium Bromide (TUDORZA PRESSAIR) 400 MCG/ACT AEPB, Inhale 1 puff into the lungs 2 (two) times daily., Disp: 1 each, Rfl: 11  EXAM:  Filed Vitals:   07/27/13 1122  BP: 140/90  Pulse: 68  Temp: 98 F (36.7 C)    There is no weight on file to calculate BMI.  GENERAL: vitals reviewed and listed above, alert, oriented, appears well hydrated and in no acute distress  HEENT: atraumatic, conjunttiva clear, no obvious abnormalities on inspection of external nose and ears, normal appearance of ear canals and TMs, clear nasal congestion, mild post oropharyngeal erythema with PND, no tonsillar edema or exudate, no sinus TTP  NECK: no obvious masses on inspection  LUNGS: clear to auscultation bilaterally, no wheezes, rales or rhonchi, good air movement  CV: HRRR, no peripheral edema  MS: moves all extremities without noticeable abnormality  PSYCH: pleasant and cooperative, no obvious depression or anxiety  ASSESSMENT AND PLAN:  Discussed the following assessment  and plan:  Acute upper respiratory infections of unspecified site  COPD exacerbation - Plan: predniSONE (DELTASONE) 20 MG tablet, doxycycline (VIBRA-TABS) 100 MG tablet  -lung exam and O2 normal -given HPI and exam findings today, a serious infection or illness is unlikely. We discussed potential etiologies, with VURI being most likely, and advised supportive care and monitoring. We discussed treatment side effects, likely course, antibiotic misuse, transmission, and signs of developing a serious illness. -however, given Asthma/COPD and increased mucus production with SOB with do abx and prednisone after discussion risks/benefits with pt. Alb prn - pt reports pcp gave her samples albuterol and she has plenty. -of course, we advised to return or  notify a doctor immediately if symptoms worsen or persist or new concerns arise.    Patient Instructions  INSTRUCTIONS FOR UPPER RESPIRATORY INFECTION:  -plenty of rest and fluids  -As we discussed, we have prescribed a new medication for you at this appointment. We discussed the common and serious potential adverse effects of this medication and you can review these and more with the pharmacist when you pick up your medication.  Please follow the instructions for use carefully and notify us immediately if you have any problems taking this medication.  -nasal saline wash 2-3 times daily (use prepackaged nasal saline or bottled/distilled water if making your own)   -can use sinex or afrin nasal spray for drainage and nasal congestion - but do NOT use longer then 3-4 days  -can use tylenol or ibuprofen as directed for aches and sorethroat  -in the winter time, using a humidifier at night is helpful (please follow cleaning instructions)  -if you are taking a cough medication - use only as directed, may also try a teaspoon of honey to coat the throat and throat lozenges  -for sore throat, salt water gargles can help  -follow up if you have fevers,  facial pain, tooth pain, difficulty breathing or are worsening or not getting better in 5-7 days      KIM, HANNAH R.

## 2013-07-27 NOTE — Patient Instructions (Signed)
INSTRUCTIONS FOR UPPER RESPIRATORY INFECTION:  -plenty of rest and fluids  -As we discussed, we have prescribed a new medication for you at this appointment. We discussed the common and serious potential adverse effects of this medication and you can review these and more with the pharmacist when you pick up your medication.  Please follow the instructions for use carefully and notify us immediately if you have any problems taking this medication.  -nasal saline wash 2-3 times daily (use prepackaged nasal saline or bottled/distilled water if making your own)   -can use sinex or afrin nasal spray for drainage and nasal congestion - but do NOT use longer then 3-4 days  -can use tylenol or ibuprofen as directed for aches and sorethroat  -in the winter time, using a humidifier at night is helpful (please follow cleaning instructions)  -if you are taking a cough medication - use only as directed, may also try a teaspoon of honey to coat the throat and throat lozenges  -for sore throat, salt water gargles can help  -follow up if you have fevers, facial pain, tooth pain, difficulty breathing or are worsening or not getting better in 5-7 days  

## 2013-08-04 ENCOUNTER — Other Ambulatory Visit: Payer: Self-pay | Admitting: Internal Medicine

## 2013-08-10 ENCOUNTER — Other Ambulatory Visit: Payer: Self-pay | Admitting: Internal Medicine

## 2013-08-23 ENCOUNTER — Other Ambulatory Visit: Payer: Self-pay | Admitting: Internal Medicine

## 2013-09-23 ENCOUNTER — Telehealth: Payer: Self-pay | Admitting: Internal Medicine

## 2013-09-23 NOTE — Telephone Encounter (Signed)
I spoke to Customer Service at [facility] and I was advised that Nexium isn't covered under patient's plan.  I asked about Dexilant, Protonix, Aciphex and none of them are covered.  Zantac is covered but the patient will pay 100%.  I was advised that a Letter of Medical Necessity for the Nexium may help with getting it approved. Please advise if you would like to do the letter and I can mail it in to the [employer].

## 2013-09-24 ENCOUNTER — Other Ambulatory Visit: Payer: Self-pay | Admitting: *Deleted

## 2013-09-24 MED ORDER — OMEPRAZOLE 40 MG PO CPDR: 40.0000 mg | DELAYED_RELEASE_CAPSULE | Freq: Every day | ORAL | Status: DC

## 2013-09-24 NOTE — Telephone Encounter (Signed)
Will try prilosec 40

## 2013-09-26 ENCOUNTER — Other Ambulatory Visit: Payer: Self-pay | Admitting: Cardiovascular Disease

## 2013-10-05 ENCOUNTER — Telehealth: Payer: Self-pay | Admitting: *Deleted

## 2013-10-05 NOTE — Telephone Encounter (Signed)
PA for crestor 40 mg sent to [facility]

## 2013-10-15 ENCOUNTER — Encounter: Payer: Self-pay | Admitting: *Deleted

## 2013-10-15 NOTE — Telephone Encounter (Signed)
Had to resend PA on crestor today

## 2013-10-16 ENCOUNTER — Ambulatory Visit (INDEPENDENT_AMBULATORY_CARE_PROVIDER_SITE_OTHER): Payer: BC Managed Care – PPO | Admitting: Internal Medicine

## 2013-10-16 ENCOUNTER — Encounter: Payer: Self-pay | Admitting: Internal Medicine

## 2013-10-16 VITALS — BP 130/80 | HR 76 | Temp 98.2°F | Resp 16 | Ht 62.5 in | Wt 149.0 lb

## 2013-10-16 DIAGNOSIS — I10 Essential (primary) hypertension: Secondary | ICD-10-CM

## 2013-10-16 DIAGNOSIS — I25810 Atherosclerosis of coronary artery bypass graft(s) without angina pectoris: Secondary | ICD-10-CM

## 2013-10-16 DIAGNOSIS — E785 Hyperlipidemia, unspecified: Secondary | ICD-10-CM

## 2013-10-16 MED ORDER — NADOLOL 40 MG PO TABS: 40.0000 mg | ORAL_TABLET | Freq: Two times a day (BID) | ORAL | Status: DC

## 2013-10-16 NOTE — Progress Notes (Signed)
Subjective:    Patient ID: Katherine Foley, female    DOB: Jul 02, 1951, 63 y.o.   MRN: [id number]  Hypertension Pertinent negatives include no headaches, neck pain or shortness of breath.  Hyperlipidemia Pertinent negatives include no myalgias or shortness of breath.  Gastrophageal Reflux She reports no abdominal pain, no coughing or no wheezing. Pertinent negatives include no fatigue.   Has gained weigt and recognised that lack of exercise pays a roll Has noted head aches and has noted elevated blood pressure  Review of Systems  Constitutional: Positive for unexpected weight change. Negative for activity change, appetite change and fatigue.  HENT: Negative for congestion, ear pain, postnasal drip and sinus pressure.   Eyes: Negative for redness and visual disturbance.  Respiratory: Negative for cough, shortness of breath and wheezing.   Gastrointestinal: Negative for abdominal pain and abdominal distention.  Genitourinary: Negative for dysuria, frequency and menstrual problem.  Musculoskeletal: Negative for arthralgias, joint swelling, myalgias and neck pain.  Skin: Negative for rash and wound.  Neurological: Positive for weakness. Negative for dizziness and headaches.  Hematological: Negative for adenopathy. Does not bruise/bleed easily.  Psychiatric/Behavioral: Negative for sleep disturbance and decreased concentration.   Past Medical History  Diagnosis Date  . AORTIC STENOSIS 07/07/2010    a. echo 12/23/10: EF 60-65%, mod to severe AS with mean gradient 29 mmHg  . Barrett's esophagus 04/14/2007  . CAD, NATIVE VESSEL 05/17/2010    a. NSTEMI 8/11 - BMS to RCA;  b. cath 4/12: dLAD 50%, RI 80% (PCI), AVCFX 30%, pRCA 30%, stent ok, 40-50, then 50% dist RCA;  c. s/p Promus DES to RI 12/25/10  . Carotid stenosis 05/17/2010    a. dopplers 95/62: RICA 13-08%; LICA 6-57% (follow up due 11/12)  . 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 Herpes simplex without mention of  complication 8/46/9629  . [place], MILD 08/29/2009  . OSTEOPENIA 04/14/2007  . URINARY INCONTINENCE, STRESS, MILD 08/26/2007  . GERD (gastroesophageal reflux disease)   . Menopause   . Ovarian cyst   . Urine, incontinence, stress female   . Adenomatous colon polyp   . HYPERTENSION 04/14/2007  . Heart murmur   . Anginal pain   . Myocardial infarction 2011    "during catherization" (03/25/2013)  . Hemorrhoid thrombosis     "had it lanced" (03/25/2013)  . Pneumonia     "multiple times" (03/25/2013)  . Chronic bronchitis     "multiple times" (03/25/2013)  . Exertional shortness of breath     "related to COPD problems" (03/25/2013)  . History of blood transfusion     'w/OHS" (03/25/2013)  . H/O hiatal hernia   . MIGRAINE HEADACHE 04/20/2008  . Migraine   . ANXIETY DEPRESSION 02/15/2009    History   Social History  . Marital Status: Single    Spouse Name: N/A    Number of Children: N/A  . Years of Education: N/A   Occupational History  . Not on file.   Social History Main Topics  . Smoking status: Former Smoker -- 1.50 packs/day for 43 years    Types: Cigarettes    Quit date: 09/03/2005  . Smokeless tobacco: Never Used  . Alcohol Use: Yes     Comment: 03/25/2013 "I have drank; very rare"  . Drug Use: No  . Sexual Activity: No   Other Topics Concern  . Not on file   Social History Narrative  . No narrative on file    Past Surgical History  Procedure  Laterality Date  . Tonsillectomy and adenoidectomy    . Nasal sinus surgery  1997; 1998    "cust in maxillary sinus; put a stent in then removed it" (03/25/2013)  . Blepharoplasty Bilateral ~ 1998  . Thrombosed hemorrohid lanced    . Ovarian cyst removal Right 2011    "size of a soccer ball" (03/25/2013)  . Appendectomy    . Total abdominal hysterectomy    . Aortic valve replacement  2012  . Cardiac catheterization    . Coronary angioplasty with stent placement      "I have 2 stents" (03/25/2013)  .  Cholecystectomy  03/25/2013    w/IOC (03/25/2013)  . Cholecystectomy N/A 03/25/2013    Procedure: LAPAROSCOPIC CHOLECYSTECTOMY WITH INTRAOPERATIVE CHOLANGIOGRAM;  Surgeon: Imogene Burn. Georgette Dover, MD;  Location: [facility] OR;  Service: General;  Laterality: N/A;    Family History  Problem Relation Age of Onset  . Coronary artery disease Other   . Heart disease Other     6 stents  . Heart disease Mother   . COPD Father   . Heart disease Father   . Heart disease Maternal Uncle     Allergies  Allergen Reactions  . Codeine Sulfate Rash    REACTION: unspecified  . Hydrocodone-Acetaminophen Rash    REACTION: unspecified  . Amoxicillin-Pot Clavulanate     REACTION: unspecified    Current Outpatient Prescriptions on File Prior to Visit  Medication Sig Dispense Refill  . acyclovir (ZOVIRAX) 200 MG capsule TAKE TWO CAPSULES BY MOUTH THREE TIMES DAILY AS NEEDED FOR  COLD  SORE  60 capsule  0  . albuterol (PROVENTIL HFA;VENTOLIN HFA) 108 (90 BASE) MCG/ACT inhaler Inhale 2 puffs into the lungs every 6 (six) hours as needed.      Marland Kitchen aspirin 81 MG tablet Take 81 mg by mouth daily.        . Butalbital-APAP-Caffeine 50-325-40 MG per capsule Take 1 capsule by mouth every 4 (four) hours as needed for pain.  30 capsule  0  . CRESTOR 40 MG tablet TAKE ONE TABLET BY MOUTH ONCE DAILY  30 tablet  2  . estradiol (VIVELLE-DOT) 0.05 MG/24HR Place 1 patch onto the skin 2 (two) times a week.      . nadolol (CORGARD) 20 MG tablet TAKE ONE TABLET BY MOUTH TWICE DAILY  60 tablet  11  . omeprazole (PRILOSEC) 40 MG capsule Take 1 capsule (40 mg total) by mouth daily.  30 capsule  3  . PRISTIQ 100 MG 24 hr tablet TAKE ONE TABLET BY MOUTH DAILY  30 tablet  6  . SPIRIVA HANDIHALER 18 MCG inhalation capsule INHALE ONE DOSE EVERY DAY  30 capsule  6  . tiotropium (SPIRIVA) 18 MCG inhalation capsule Place 18 mcg into inhaler and inhale daily.      . traMADol (ULTRAM) 50 MG tablet Take 1 tablet (50 mg total) by mouth every 8 (eight)  hours as needed for pain.  30 tablet  0  . VIVELLE-DOT 0.05 MG/24HR patch APPLY ONE PATCH TOPICALLY AND CHANGE TWICE A WEEK  10 patch  3  . [DISCONTINUED] Aclidinium Bromide (TUDORZA PRESSAIR) 400 MCG/ACT AEPB Inhale 1 puff into the lungs 2 (two) times daily.  1 each  11   No current facility-administered medications on file prior to visit.    BP 130/80  Pulse 76  Temp(Src) 98.2 F (36.8 C)  Resp 16  Ht 5' 2.5" (1.588 m)  Wt 149 lb (67.586 kg)  BMI 26.80 kg/m2  Objective:   Physical Exam  Nursing note and vitals reviewed. Constitutional: She appears well-developed and well-nourished.  HENT:  Head: Normocephalic and atraumatic.  Eyes: Conjunctivae are normal. Pupils are equal, round, and reactive to light.  Cardiovascular: Normal rate.   Pulmonary/Chest: Effort normal and breath sounds normal.  Abdominal: Soft. Bowel sounds are normal.  Skin: Skin is warm and dry.  Psychiatric: She has a normal mood and affect. Her behavior is normal.          Assessment & Plan:  Taking over 10,000 steps but not getting heart rate up having head aches and elevations of blood pressure: related Increase the corgard and continue the crestor due to CAD risk Monitor symptoms with better blood pressure control   Cannot afford crestor

## 2013-10-16 NOTE — Patient Instructions (Signed)
The patient is instructed to continue all medications as prescribed. Schedule followup with check out clerk upon leaving the clinic  

## 2013-10-19 ENCOUNTER — Telehealth: Payer: Self-pay | Admitting: Internal Medicine

## 2013-10-19 NOTE — Telephone Encounter (Signed)
Relevant patient education assigned to patient using Emmi. ° °

## 2013-10-21 ENCOUNTER — Telehealth: Payer: Self-pay | Admitting: *Deleted

## 2013-10-21 NOTE — Telephone Encounter (Signed)
[facility] to [facility] for denial of crestor 40 mg daily

## 2013-10-28 ENCOUNTER — Other Ambulatory Visit: Payer: Self-pay | Admitting: Internal Medicine

## 2013-11-02 NOTE — Telephone Encounter (Signed)
Appeal for patients Crestor tablets approved effective 10/07/2013-10/27/2014

## 2013-11-05 ENCOUNTER — Encounter (HOSPITAL_COMMUNITY): Payer: Self-pay | Admitting: Emergency Medicine

## 2013-11-05 ENCOUNTER — Telehealth: Payer: Self-pay | Admitting: Internal Medicine

## 2013-11-05 ENCOUNTER — Emergency Department (HOSPITAL_COMMUNITY): Payer: BC Managed Care – PPO

## 2013-11-05 ENCOUNTER — Observation Stay (HOSPITAL_COMMUNITY)
Admission: EM | Admit: 2013-11-05 | Discharge: 2013-11-06 | Disposition: A | Discharge status: Home / Self Care | Payer: BC Managed Care – PPO | Attending: Family Medicine | Admitting: Family Medicine

## 2013-11-05 DIAGNOSIS — I25119 Atherosclerotic heart disease of native coronary artery with unspecified angina pectoris: Secondary | ICD-10-CM | POA: Diagnosis present

## 2013-11-05 DIAGNOSIS — N83209 Unspecified ovarian cyst, unspecified side: Secondary | ICD-10-CM | POA: Insufficient documentation

## 2013-11-05 DIAGNOSIS — R0989 Other specified symptoms and signs involving the circulatory and respiratory systems: Secondary | ICD-10-CM

## 2013-11-05 DIAGNOSIS — B009 Herpesviral infection, unspecified: Secondary | ICD-10-CM

## 2013-11-05 DIAGNOSIS — E785 Hyperlipidemia, unspecified: Secondary | ICD-10-CM

## 2013-11-05 DIAGNOSIS — Z7982 Long term (current) use of aspirin: Secondary | ICD-10-CM | POA: Insufficient documentation

## 2013-11-05 DIAGNOSIS — N393 Stress incontinence (female) (male): Secondary | ICD-10-CM | POA: Insufficient documentation

## 2013-11-05 DIAGNOSIS — G43909 Migraine, unspecified, not intractable, without status migrainosus: Secondary | ICD-10-CM

## 2013-11-05 DIAGNOSIS — Z952 Presence of prosthetic heart valve: Secondary | ICD-10-CM

## 2013-11-05 DIAGNOSIS — K8010 Calculus of gallbladder with chronic cholecystitis without obstruction: Secondary | ICD-10-CM

## 2013-11-05 DIAGNOSIS — F341 Dysthymic disorder: Secondary | ICD-10-CM

## 2013-11-05 DIAGNOSIS — R079 Chest pain, unspecified: Secondary | ICD-10-CM

## 2013-11-05 DIAGNOSIS — I6529 Occlusion and stenosis of unspecified carotid artery: Secondary | ICD-10-CM

## 2013-11-05 DIAGNOSIS — M899 Disorder of bone, unspecified: Secondary | ICD-10-CM

## 2013-11-05 DIAGNOSIS — R0600 Dyspnea, unspecified: Secondary | ICD-10-CM

## 2013-11-05 DIAGNOSIS — F329 Major depressive disorder, single episode, unspecified: Secondary | ICD-10-CM | POA: Insufficient documentation

## 2013-11-05 DIAGNOSIS — Z87891 Personal history of nicotine dependence: Secondary | ICD-10-CM | POA: Insufficient documentation

## 2013-11-05 DIAGNOSIS — F3289 Other specified depressive episodes: Secondary | ICD-10-CM | POA: Insufficient documentation

## 2013-11-05 DIAGNOSIS — I10 Essential (primary) hypertension: Secondary | ICD-10-CM

## 2013-11-05 DIAGNOSIS — Z860100 Personal history of colon polyps, unspecified: Secondary | ICD-10-CM | POA: Insufficient documentation

## 2013-11-05 DIAGNOSIS — I2510 Atherosclerotic heart disease of native coronary artery without angina pectoris: Secondary | ICD-10-CM

## 2013-11-05 DIAGNOSIS — J449 Chronic obstructive pulmonary disease, unspecified: Secondary | ICD-10-CM | POA: Diagnosis present

## 2013-11-05 DIAGNOSIS — N39498 Other specified urinary incontinence: Secondary | ICD-10-CM

## 2013-11-05 DIAGNOSIS — Z8701 Personal history of pneumonia (recurrent): Secondary | ICD-10-CM | POA: Insufficient documentation

## 2013-11-05 DIAGNOSIS — R072 Precordial pain: Principal | ICD-10-CM

## 2013-11-05 DIAGNOSIS — J441 Chronic obstructive pulmonary disease with (acute) exacerbation: Secondary | ICD-10-CM | POA: Insufficient documentation

## 2013-11-05 DIAGNOSIS — M949 Disorder of cartilage, unspecified: Secondary | ICD-10-CM

## 2013-11-05 DIAGNOSIS — I252 Old myocardial infarction: Secondary | ICD-10-CM | POA: Insufficient documentation

## 2013-11-05 DIAGNOSIS — N951 Menopausal and female climacteric states: Secondary | ICD-10-CM

## 2013-11-05 DIAGNOSIS — F411 Generalized anxiety disorder: Secondary | ICD-10-CM | POA: Insufficient documentation

## 2013-11-05 DIAGNOSIS — K2270 Barrett's esophagus without dysplasia: Secondary | ICD-10-CM

## 2013-11-05 DIAGNOSIS — Z9889 Other specified postprocedural states: Secondary | ICD-10-CM | POA: Insufficient documentation

## 2013-11-05 DIAGNOSIS — K219 Gastro-esophageal reflux disease without esophagitis: Secondary | ICD-10-CM | POA: Insufficient documentation

## 2013-11-05 DIAGNOSIS — R32 Unspecified urinary incontinence: Secondary | ICD-10-CM | POA: Insufficient documentation

## 2013-11-05 DIAGNOSIS — Z9861 Coronary angioplasty status: Secondary | ICD-10-CM | POA: Insufficient documentation

## 2013-11-05 DIAGNOSIS — I209 Angina pectoris, unspecified: Secondary | ICD-10-CM | POA: Insufficient documentation

## 2013-11-05 DIAGNOSIS — I359 Nonrheumatic aortic valve disorder, unspecified: Secondary | ICD-10-CM

## 2013-11-05 DIAGNOSIS — J4489 Other specified chronic obstructive pulmonary disease: Secondary | ICD-10-CM

## 2013-11-05 DIAGNOSIS — M25519 Pain in unspecified shoulder: Secondary | ICD-10-CM | POA: Insufficient documentation

## 2013-11-05 DIAGNOSIS — Q2529 Other atresia of aorta: Secondary | ICD-10-CM

## 2013-11-05 DIAGNOSIS — Z79899 Other long term (current) drug therapy: Secondary | ICD-10-CM | POA: Insufficient documentation

## 2013-11-05 DIAGNOSIS — R0609 Other forms of dyspnea: Secondary | ICD-10-CM

## 2013-11-05 DIAGNOSIS — R011 Cardiac murmur, unspecified: Secondary | ICD-10-CM | POA: Insufficient documentation

## 2013-11-05 DIAGNOSIS — Q251 Coarctation of aorta: Secondary | ICD-10-CM | POA: Insufficient documentation

## 2013-11-05 DIAGNOSIS — R0789 Other chest pain: Secondary | ICD-10-CM

## 2013-11-05 DIAGNOSIS — K645 Perianal venous thrombosis: Secondary | ICD-10-CM | POA: Insufficient documentation

## 2013-11-05 LAB — BASIC METABOLIC PANEL
BUN: 10 mg/dL (ref 6–23)
CALCIUM: 9.3 mg/dL (ref 8.4–10.5)
CO2: 26 meq/L (ref 19–32)
CREATININE: 0.58 mg/dL (ref 0.50–1.10)
Chloride: 97 mEq/L (ref 96–112)
GFR calc Af Amer: >90 mL/min (ref >90)
GFR calc non Af Amer: >90 mL/min (ref >90)
Glucose, Bld: 104 mg/dL — ABNORMAL HIGH (ref 70–99)
Potassium: 4.2 mEq/L (ref 3.7–5.3)
Sodium: 135 mEq/L — ABNORMAL LOW (ref 137–147)

## 2013-11-05 LAB — CBC
HCT: 40.6 % (ref 36.0–46.0)
Hemoglobin: 13.5 g/dL (ref 12.0–15.0)
MCH: 29.9 pg (ref 26.0–34.0)
MCHC: 33.3 g/dL (ref 30.0–36.0)
MCV: 89.8 fL (ref 78.0–100.0)
PLATELETS: 185 10*3/uL (ref 150–400)
RBC: 4.52 MIL/uL (ref 3.87–5.11)
RDW: 13.6 % (ref 11.5–15.5)
WBC: 6.9 10*3/uL (ref 4.0–10.5)

## 2013-11-05 LAB — D-DIMER, QUANTITATIVE: D-Dimer, Quant: 0.62 ug/mL-FEU — ABNORMAL HIGH (ref 0.00–0.48)

## 2013-11-05 LAB — I-STAT TROPONIN, ED: Troponin i, poc: 0 ng/mL (ref 0.00–0.08)

## 2013-11-05 LAB — TROPONIN I: Troponin I: <0.3 ng/mL (ref <0.30)

## 2013-11-05 LAB — PRO B NATRIURETIC PEPTIDE: Pro B Natriuretic peptide (BNP): 140.5 pg/mL — ABNORMAL HIGH (ref 0–125)

## 2013-11-05 MED ORDER — PANTOPRAZOLE SODIUM 40 MG PO TBEC
80.0000 mg | DELAYED_RELEASE_TABLET | Freq: Every day | ORAL | Status: DC
  Administered 2013-11-06: 80 mg via ORAL
  Filled 2013-11-05 (×2): qty 2

## 2013-11-05 MED ORDER — NITROGLYCERIN 0.4 MG SL SUBL: 0.4000 mg | SUBLINGUAL_TABLET | SUBLINGUAL | Status: DC | PRN

## 2013-11-05 MED ORDER — ATORVASTATIN CALCIUM 80 MG PO TABS
80.0000 mg | ORAL_TABLET | Freq: Every day | ORAL | Status: DC
  Filled 2013-11-05: qty 1

## 2013-11-05 MED ORDER — NITROGLYCERIN 0.4 MG SL SUBL
0.4000 mg | SUBLINGUAL_TABLET | Freq: Once | SUBLINGUAL | Status: AC
  Administered 2013-11-05: 0.4 mg via SUBLINGUAL
  Filled 2013-11-05: qty 1

## 2013-11-05 MED ORDER — BIOTIN 10 MG PO TABS: 10.0000 mg | ORAL_TABLET | Freq: Every day | ORAL | Status: DC

## 2013-11-05 MED ORDER — MORPHINE SULFATE 4 MG/ML IJ SOLN
4.0000 mg | Freq: Once | INTRAMUSCULAR | Status: AC
  Administered 2013-11-05: 4 mg via INTRAVENOUS
  Filled 2013-11-05: qty 1

## 2013-11-05 MED ORDER — TIOTROPIUM BROMIDE MONOHYDRATE 18 MCG IN CAPS
18.0000 ug | ORAL_CAPSULE | Freq: Every day | RESPIRATORY_TRACT | Status: DC
  Filled 2013-11-05: qty 5

## 2013-11-05 MED ORDER — NADOLOL 40 MG PO TABS
40.0000 mg | ORAL_TABLET | Freq: Two times a day (BID) | ORAL | Status: DC
  Filled 2013-11-05: qty 1

## 2013-11-05 MED ORDER — ALBUTEROL SULFATE HFA 108 (90 BASE) MCG/ACT IN AERS: 2.0000 | INHALATION_SPRAY | Freq: Four times a day (QID) | RESPIRATORY_TRACT | Status: DC | PRN

## 2013-11-05 MED ORDER — ALBUTEROL SULFATE (2.5 MG/3ML) 0.083% IN NEBU: 2.5000 mg | INHALATION_SOLUTION | RESPIRATORY_TRACT | Status: DC | PRN

## 2013-11-05 MED ORDER — SODIUM CHLORIDE 0.9 % IJ SOLN
3.0000 mL | Freq: Two times a day (BID) | INTRAMUSCULAR | Status: DC
  Administered 2013-11-05 – 2013-11-06 (×2): 3 mL via INTRAVENOUS

## 2013-11-05 MED ORDER — MORPHINE SULFATE 2 MG/ML IJ SOLN
2.0000 mg | INTRAMUSCULAR | Status: DC | PRN
  Administered 2013-11-05 – 2013-11-06 (×2): 2 mg via INTRAVENOUS
  Filled 2013-11-05 (×2): qty 1

## 2013-11-05 MED ORDER — ONDANSETRON HCL 4 MG PO TABS: 4.0000 mg | ORAL_TABLET | Freq: Four times a day (QID) | ORAL | Status: DC | PRN

## 2013-11-05 MED ORDER — ENOXAPARIN SODIUM 40 MG/0.4ML ~~LOC~~ SOLN
40.0000 mg | SUBCUTANEOUS | Status: DC
  Administered 2013-11-05: 40 mg via SUBCUTANEOUS
  Filled 2013-11-05 (×2): qty 0.4

## 2013-11-05 MED ORDER — ASPIRIN 81 MG PO CHEW
324.0000 mg | CHEWABLE_TABLET | Freq: Once | ORAL | Status: AC
  Administered 2013-11-05: 324 mg via ORAL
  Filled 2013-11-05: qty 4

## 2013-11-05 MED ORDER — ONDANSETRON HCL 4 MG/2ML IJ SOLN: 4.0000 mg | Freq: Four times a day (QID) | INTRAMUSCULAR | Status: DC | PRN

## 2013-11-05 MED ORDER — ASPIRIN EC 325 MG PO TBEC
325.0000 mg | DELAYED_RELEASE_TABLET | Freq: Every day | ORAL | Status: DC
  Administered 2013-11-06: 325 mg via ORAL
  Filled 2013-11-05: qty 1

## 2013-11-05 NOTE — Consult Note (Signed)
CARDIOLOGY CONSULT NOTE   Patient ID: Katherine Foley MRN: [id number] DOB/AGE: 1951-07-17 63 y.o.  Admit date: 11/05/2013  Primary Physician   [name], MD Primary Cardiologist   [facility]  Reason for Consultation   Chest pain  STM:[id number] [name] is a 63 y.o. female with a history of MI/CAD with prior stents in 2011 (RCA) and 2012 (LCX), CABG with single vessel CABG (LIMA-LAD) 04/2011, aortic stenosis status post bioprosthetic aVR 04/2011, COPD, HLD, GERD, hypertension admitted with right shoulder pain and chest pain.  Symptoms started at 430 AM this morning with right shoulder pain, that later radiated to chest and back. Described as a severe sharp pain with associated SOB. No N/V, no diapherosis. Pain lasted several hours per her report. Reports over the last week or two she has been having worsening DOE which is new for her. Reports pain similar to prior pain when she had stents placed. Her chest pain resolved with NG and IV morphine, continues to have some right shoulder pain.    EKG no acute ischemic changes, initial enzymes negative. D-dimer pending.    Past Medical History  Diagnosis Date  . AORTIC STENOSIS 07/07/2010    a. echo 12/23/10: EF 60-65%, mod to severe AS with mean gradient 29 mmHg  . Barrett's esophagus 04/14/2007  . CAD, NATIVE VESSEL 05/17/2010    a. NSTEMI 8/11 - BMS to RCA;  b. cath 4/12: dLAD 50%, RI 80% (PCI), AVCFX 30%, pRCA 30%, stent ok, 40-50, then 50% dist RCA;  c. s/p Promus DES to RI 12/25/10  . Carotid stenosis 05/17/2010    a. dopplers 29/79: RICA 89-21%; LICA 1-94% (follow up due 11/12)  . 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 Herpes simplex without mention of complication 1/74/0814  . [place], MILD 08/29/2009  . OSTEOPENIA 04/14/2007  . URINARY INCONTINENCE, STRESS, MILD 08/26/2007  . GERD (gastroesophageal reflux disease)   . Menopause   . Ovarian cyst   . Urine, incontinence, stress female   . Adenomatous colon  polyp   . HYPERTENSION 04/14/2007  . Heart murmur   . Anginal pain   . Myocardial infarction 2011    "during catherization" (03/25/2013)  . Hemorrhoid thrombosis     "had it lanced" (03/25/2013)  . Pneumonia     "multiple times" (03/25/2013)  . Chronic bronchitis     "multiple times" (03/25/2013)  . Exertional shortness of breath     "related to COPD problems" (03/25/2013)  . History of blood transfusion     'w/OHS" (03/25/2013)  . H/O hiatal hernia   . MIGRAINE HEADACHE 04/20/2008  . Migraine   . ANXIETY DEPRESSION 02/15/2009     Past Surgical History  Procedure Laterality Date  . Tonsillectomy and adenoidectomy    . Nasal sinus surgery  1997; 1998    "cust in maxillary sinus; put a stent in then removed it" (03/25/2013)  . Blepharoplasty Bilateral ~ 1998  . Thrombosed hemorrohid lanced    . Ovarian cyst removal Right 2011    "size of a soccer ball" (03/25/2013)  . Appendectomy    . Total abdominal hysterectomy    . Aortic valve replacement  2012  . Cardiac catheterization    . Coronary angioplasty with stent placement      "I have 2 stents" (03/25/2013)  . Cholecystectomy  03/25/2013    w/IOC (03/25/2013)  . Cholecystectomy N/A 03/25/2013    Procedure: LAPAROSCOPIC CHOLECYSTECTOMY WITH INTRAOPERATIVE CHOLANGIOGRAM;  Surgeon: Imogene Burn. Tsuei, MD;  Location: [facility] OR;  Service: General;  Laterality: N/A;    Allergies  Allergen Reactions  . Codeine Sulfate Rash    REACTION: unspecified  . Hydrocodone-Acetaminophen Rash    REACTION: unspecified  . Amoxicillin-Pot Clavulanate     REACTION: unspecified    I have reviewed the patient's current medications . [START ON 11/06/2013] aspirin EC  325 mg Oral Daily  . [START ON 11/06/2013] atorvastatin  80 mg Oral q1800  . Biotin  10 mg Oral Daily  . enoxaparin (LOVENOX) injection  40 mg Subcutaneous Q24H  . nadolol  40 mg Oral BID  . [START ON 11/06/2013] pantoprazole  80 mg Oral Daily  . sodium chloride  3 mL Intravenous Q12H  .  tiotropium  18 mcg Inhalation Daily     albuterol, morphine injection, nitroGLYCERIN, ondansetron (ZOFRAN) IV, ondansetron  Prior to Admission medications   Medication Sig Start Date End Date Taking? Authorizing Provider  albuterol (PROVENTIL HFA;VENTOLIN HFA) 108 (90 BASE) MCG/ACT inhaler Inhale 2 puffs into the lungs every 6 (six) hours as needed. 06/19/11  Yes Ricard Dillon, MD  aspirin 81 MG tablet Take 81 mg by mouth daily.     Yes Historical Provider, MD  Biotin 10 MG TABS Take 10 mg by mouth daily.   Yes Historical Provider, MD  estradiol (VIVELLE-DOT) 0.05 MG/24HR Place 1 patch onto the skin 2 (two) times a week. Thursday and Monday   Yes Historical Provider, MD  nadolol (CORGARD) 40 MG tablet Take 1 tablet (40 mg total) by mouth 2 (two) times daily. 10/16/13  Yes Ricard Dillon, MD  omeprazole (PRILOSEC) 40 MG capsule Take 1 capsule (40 mg total) by mouth daily. 09/24/13  Yes Ricard Dillon, MD  rosuvastatin (CRESTOR) 40 MG tablet Take 40 mg by mouth daily.   Yes Historical Provider, MD  tiotropium (SPIRIVA) 18 MCG inhalation capsule Place 18 mcg into inhaler and inhale daily.   Yes Historical Provider, MD     History   Social History  . Marital Status: Single    Spouse Name: N/A    Number of Children: N/A  . Years of Education: N/A   Occupational History  .    Social History Main Topics  . Smoking status: Former Smoker -- 1.50 packs/day for 43 years    Types: Cigarettes    Quit date: 09/03/2005  . Smokeless tobacco: Never Used  . Alcohol Use: Yes     Comment: 03/25/2013 "I have drank; very rare"  . Drug Use: No  . Sexual Activity: No   Other Topics Concern  . Not on file   Social History Narrative  . No narrative on file    Family Status  Relation Status Death Age  . Other Alive   . Mother Deceased   . Father Deceased    Family History  Problem Relation Age of Onset  . Coronary artery disease Other   . Heart disease Other     6 stents  . Heart disease  Mother   . COPD Father   . Heart disease Father   . Heart disease Maternal Uncle      ROS:  Full 14 point review of systems complete and found to be negative unless listed above.  Physical Exam: Blood pressure 134/86, pulse 72, temperature 98.3 F (36.8 C), temperature source Oral, resp. rate 16, height 5\' 2"  (1.575 m), weight 146 lb (66.225 kg), SpO2 98.00%.  General: Well developed, well nourished, female in no acute distress Head:  Eyes PERRLA, No xanthomas.   Normocephalic and atraumatic, oropharynx without edema or exudate. Dentition:  Lungs:  Heart: RRR, 2/6 systolic murmur RUSB, mild bilateral carotid bruits, no JD Abdomen: Bowel sounds present, abdomen soft and non-tender without masses or hernias noted. Msk:  Right shoulder tender to palpation. Right chest wall mildly tender to palpation.  Extremities: No clubbing or cyanosis.  edema.  Neuro: Alert and oriented X 3. No focal deficits noted. Psych:  Good affect, responds appropriately Skin: No rashes or lesions noted.  Labs:   Lab Results  Component Value Date   WBC 6.9 11/05/2013   HGB 13.5 11/05/2013   HCT 40.6 11/05/2013   MCV 89.8 11/05/2013   PLT 185 11/05/2013    Recent Labs Lab 11/05/13 1308  NA 135*  K 4.2  CL 97  CO2 26  BUN 10  CREATININE 0.58  CALCIUM 9.3  GLUCOSE 104*    Recent Labs  11/05/13 1318  TROPIPOC 0.00   Pro B Natriuretic peptide (BNP)  Date/Time Value Ref Range Status  11/05/2013  1:08 PM 140.5* 0 - 125 pg/mL Final   Echo: 08/07/2011 Study Conclusions - Left ventricle: The cavity size was normal. Wall thickness was normal. Systolic function was normal. The estimated ejection fraction was in the range of 55% to 60%. - Aortic valve: Prosthetic AVR well seated with no perivalvular leak or AR. Normal gradients - Mitral valve: Mild regurgitation. - Left atrium: The atrium was mildly dilated. - Right atrium: The atrium was mildly dilated. - Atrial septum: No defect or patent foramen ovale  was identified.  ECG:  SR, no acute ischemic changes  Radiology:  Dg Chest 2 View (if Patient Has Fever And/or Copd) 11/05/2013   CLINICAL DATA:  Chest pain  EXAM: CHEST  2 VIEW  COMPARISON:  04/01/2012  FINDINGS: Cardiac shadow is stable. Postsurgical changes are again noted. The previously seen nipple shadows are less well visualized on the current exam. Chronic changes in the left lobe lung base are seen. No acute abnormality is noted.  IMPRESSION: No active cardiopulmonary disease.   Electronically Signed   By: Inez Catalina M.D.   On: 11/05/2013 14:55   Echo 08/2011  LVEF 55-60%, prosthetic AVR well seated with normal function, mild MR.      ASSESSMENT AND PLAN:    Principal Problem:   Chest pain Active Problems:   HYPERLIPIDEMIA, MILD   ANXIETY DEPRESSION   HYPERTENSION   CAD, NATIVE VESSEL   CHRONIC OBSTRUCTIVE PULMONARY DISEASE, MILD   A/P 1. Chest pain - fairly atypical chest pain, though she states her prior angina was atypical and current symptoms are similar. She also complains of several days of increasing DOE prior to the onset of chest pain.  - strong history of CAD with prior stenting and single vessel CABG in 2012, she has not had an ischemic evaluation since that time.   -will obtain exercise nuclear stress test to risk stratify patient - given resolution of chest pain and negative enzymes, will not antcoagulate at this time.If recurrence of symptoms, EKG changes, or positive enzymes recommend initiate anticoagulation. - follow up D-dimer, consider CT PE if positive.   2. AVR - in setting of DOE will obtain echo to reevaluate valve, last echo 2012 showed normal function.

## 2013-11-05 NOTE — Telephone Encounter (Signed)
Patient Information:  Caller Name: [name]  Phone: [phone]  Patient: Katherine Foley, Katherine Foley  Gender: Female  DOB: 10-18-1950  Age: 63 Years  PCP: Benay Pillow (Adults only)  Office Follow Up:  Does the office need to follow up with this patient?: No  Instructions For The Office: N/A   Symptoms  Reason For Call & Symptoms: Right shoulder pain started 11/04/13 and breathing difficulty started 11/03/13.  She has used her Spiriva and had to use an albuterol inhaler.  Intermittent cough and sinuses hurt but no nasal drainage.  She can hear cracking in her chest when she breathes.  She states the last time she felt like this she had a pneumothorax.  No appts and spoke with Butch Penny in the office who advised send pt to the ED.   Reviewed Health History In EMR: Yes  Reviewed Medications In EMR: Yes  Reviewed Allergies In EMR: Yes  Reviewed Surgeries / Procedures: No  Date of Onset of Symptoms: 11/03/2013  Treatments Tried: Spiriva and Albuterol  and Advil for H/A pain  Treatments Tried Worked: No  Guideline(s) Used:  Breathing Difficulty  Asthma Attack  Disposition Per Guideline:   Go to ED Now (or to Office with PCP Approval)  Reason For Disposition Reached:   Chest pain  Advice Given:  N/A  Patient Will Follow Care Advice:  YES

## 2013-11-05 NOTE — ED Notes (Signed)
Pt presents with sudden onset of shortness of breath x 2 days.  Pt reports dry cough, has been using inhalers with no relief.  Pt reports onset of chest tightness that began this morning which has made her more short of breath.

## 2013-11-05 NOTE — H&P (Signed)
History and Physical  [name] [id number] DOB: [birth year] DOA: 11/05/2013  Referring physician: EDP PCP: [name], MD  Outpatient Specialists:  1. Cardiology: Dr. Sherren Mocha 2. Cardiothoracic surgery: Dr. Roxan Hockey  Chief Complaint: Dyspnea and chest pain.  HPI: Katherine Foley is a 63 y.o. female with history of MI/CAD, CABG, aortic stenosis status post bioprosthetic aVR, COPD, HLD, GERD, hypertension, former smoker, presented to the ED with complaints of worsening dyspnea and new onset of chest pain. She started having dyspnea approximately 4 days ago which was gradual in onset, associated with wheezing, dry cough, no fever or chills. She continued to use her Spiriva and when necessary albuterol inhalers without significant relief. Her dyspnea has progressively worsened over the last couple of days. No sickly contacts. No recent long-distance travel, asymmetrical in swelling or pain. She woke up this morning with severe sharp right shoulder and right upper back pain. She denied sleeping in an abnormal position, lifting anything unusually heavy or trauma. Pain was rated as 10/10 in severity, not made worse by movements of right shoulder. Over the course of the next hour or 2 the pain moved to upper mid chest. This was made worse with deep inspiration. She called her PCPs office and was advised to come to the ED. She states that she got a dose of sublingual nitroglycerin and her anterior chest pain and right upper back pain improved from 10/5 to 5/5 but shoulder pain persisted. She subsequently got IV morphine and her right shoulder pain started to subside. She indicates that her right upper back pain is similar to the pain that she had when she had her heart attack in 2011. In the ED, chest x-ray and EKG without acute findings and troponin x1 negative. Hospitalist admission requested. Patient denies history of stress test or angiogram since her CABG in 2012.   Review of  Systems: All systems reviewed and apart from history of presenting illness, are negative.  Past Medical History  Diagnosis Date  . AORTIC STENOSIS 07/07/2010    a. echo 12/23/10: EF 60-65%, mod to severe AS with mean gradient 29 mmHg  . Barrett's esophagus 04/14/2007  . CAD, NATIVE VESSEL 05/17/2010    a. NSTEMI 8/11 - BMS to RCA;  b. cath 4/12: dLAD 50%, RI 80% (PCI), AVCFX 30%, pRCA 30%, stent ok, 40-50, then 50% dist RCA;  c. s/p Promus DES to RI 12/25/10  . Carotid stenosis 05/17/2010    a. dopplers Q000111Q: RICA Q000111Q; LICA [id number] (follow up due 11/12)  . 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 Herpes simplex without mention of complication [id number]  . [place], MILD 08/29/2009  . OSTEOPENIA 04/14/2007  . URINARY INCONTINENCE, STRESS, MILD 08/26/2007  . GERD (gastroesophageal reflux disease)   . Menopause   . Ovarian cyst   . Urine, incontinence, stress female   . Adenomatous colon polyp   . HYPERTENSION 04/14/2007  . Heart murmur   . Anginal pain   . Myocardial infarction 2011    "during catherization" (03/25/2013)  . Hemorrhoid thrombosis     "had it lanced" (03/25/2013)  . Pneumonia     "multiple times" (03/25/2013)  . Chronic bronchitis     "multiple times" (03/25/2013)  . Exertional shortness of breath     "related to COPD problems" (03/25/2013)  . History of blood transfusion     'w/OHS" (03/25/2013)  . H/O hiatal hernia   . MIGRAINE HEADACHE 04/20/2008  . Migraine   . ANXIETY DEPRESSION  02/15/2009   Past Surgical History  Procedure Laterality Date  . Tonsillectomy and adenoidectomy    . Nasal sinus surgery  1997; 1998    "cust in maxillary sinus; put a stent in then removed it" (03/25/2013)  . Blepharoplasty Bilateral ~ 1998  . Thrombosed hemorrohid lanced    . Ovarian cyst removal Right 2011    "size of a soccer ball" (03/25/2013)  . Appendectomy    . Total abdominal hysterectomy    . Aortic valve replacement  2012  . Cardiac catheterization      . Coronary angioplasty with stent placement      "I have 2 stents" (03/25/2013)  . Cholecystectomy  03/25/2013    w/IOC (03/25/2013)  . Cholecystectomy N/A 03/25/2013    Procedure: LAPAROSCOPIC CHOLECYSTECTOMY WITH INTRAOPERATIVE CHOLANGIOGRAM;  Surgeon: Imogene Burn. Georgette Dover, MD;  Location: [facility];  Service: General;  Laterality: N/A;   Social History:  reports that she quit smoking about 8 years ago. Her smoking use included Cigarettes. She has a 64.5 pack-year smoking history. She has never used smokeless tobacco. She reports that she drinks alcohol. She reports that she does not use illicit drugs. Patient lives with her fianc and is independent of activities of daily living.  Allergies  Allergen Reactions  . Codeine Sulfate Rash    REACTION: unspecified  . Hydrocodone-Acetaminophen Rash    REACTION: unspecified  . Amoxicillin-Pot Clavulanate     REACTION: unspecified    Family History  Problem Relation Age of Onset  . Coronary artery disease Other   . Heart disease Other     6 stents  . Heart disease Mother   . COPD Father   . Heart disease Father   . Heart disease Maternal Uncle    Prior to Admission medications   Medication Sig Start Date End Date Taking? Authorizing Provider  albuterol (PROVENTIL HFA;VENTOLIN HFA) 108 (90 BASE) MCG/ACT inhaler Inhale 2 puffs into the lungs every 6 (six) hours as needed. 06/19/11  Yes Ricard Dillon, MD  aspirin 81 MG tablet Take 81 mg by mouth daily.     Yes Historical Provider, MD  Biotin 10 MG TABS Take 10 mg by mouth daily.   Yes Historical Provider, MD  estradiol (VIVELLE-DOT) 0.05 MG/24HR Place 1 patch onto the skin 2 (two) times a week. Thursday and Monday   Yes Historical Provider, MD  nadolol (CORGARD) 40 MG tablet Take 1 tablet (40 mg total) by mouth 2 (two) times daily. 10/16/13  Yes Ricard Dillon, MD  omeprazole (PRILOSEC) 40 MG capsule Take 1 capsule (40 mg total) by mouth daily. 09/24/13  Yes Ricard Dillon, MD  rosuvastatin  (CRESTOR) 40 MG tablet Take 40 mg by mouth daily.   Yes Historical Provider, MD  tiotropium (SPIRIVA) 18 MCG inhalation capsule Place 18 mcg into inhaler and inhale daily.   Yes Historical Provider, MD   Physical Exam: Filed Vitals:   11/05/13 1537 11/05/13 1630 11/05/13 1730 11/05/13 1737  BP: 127/82 101/64 118/79   Pulse: 74 71 77 71  Temp:      TempSrc:      Resp: 15 15 12 14   Height:      Weight:      SpO2: 99% 98% 95% 97%     General exam: Moderately built and nourished female patient, lying comfortably supine on the gurney in no obvious distress.  Head, eyes and ENT: Nontraumatic and normocephalic. Pupils equally reacting to light and accommodation. Oral mucosa moist.  Neck:  Supple. No JVD, carotid bruit or thyromegaly.  Lymphatics: No lymphadenopathy.  Respiratory system: Slightly diminished breath sounds bilaterally with occasional bilateral expiratory rhonchi but no crackles. No increased work of breathing.  Cardiovascular system: S1 and S2 heard, RRR. No JVD, murmurs, gallops, clicks or pedal edema.  Gastrointestinal system: Abdomen is nondistended, soft and nontender. Normal bowel sounds heard. No organomegaly or masses appreciated.  Central nervous system: Alert and oriented. No focal neurological deficits.  Extremities: Symmetric 5 x 5 power. Peripheral pulses symmetrically felt.   Skin: No rashes or acute findings.  Musculoskeletal system: Negative exam. Reproducible right shoulder tenderness but not chest wall or back tenderness.  Psychiatry: Pleasant and cooperative.   Labs on Admission:  Basic Metabolic Panel:  Recent Labs Lab 11/05/13 1308  NA 135*  K 4.2  CL 97  CO2 26  GLUCOSE 104*  BUN 10  CREATININE 0.58  CALCIUM 9.3   Liver Function Tests: No results found for this basename: AST, ALT, ALKPHOS, BILITOT, PROT, ALBUMIN,  in the last 168 hours No results found for this basename: LIPASE, AMYLASE,  in the last 168 hours No results found for  this basename: AMMONIA,  in the last 168 hours CBC:  Recent Labs Lab 11/05/13 1308  WBC 6.9  HGB 13.5  HCT 40.6  MCV 89.8  PLT 185   Cardiac Enzymes: No results found for this basename: CKTOTAL, CKMB, CKMBINDEX, TROPONINI,  in the last 168 hours  BNP (last 3 results)  Recent Labs  11/05/13 1308  PROBNP 140.5*   CBG: No results found for this basename: GLUCAP,  in the last 168 hours  Radiological Exams on Admission: Dg Chest 2 View (if Patient Has Fever And/or Copd)  11/05/2013   CLINICAL DATA:  Chest pain  EXAM: CHEST  2 VIEW  COMPARISON:  04/01/2012  FINDINGS: Cardiac shadow is stable. Postsurgical changes are again noted. The previously seen nipple shadows are less well visualized on the current exam. Chronic changes in the left lobe lung base are seen. No acute abnormality is noted.  IMPRESSION: No active cardiopulmonary disease.   Electronically Signed   By: Inez Catalina M.D.   On: 11/05/2013 14:55    EKG: Independently reviewed. Normal sinus rhythm at 83 beats per minute, normal axis and in no acute changes. QTC 430 milliseconds.  Assessment/Plan Principal Problem:   Chest pain Active Problems:   HYPERLIPIDEMIA, MILD   ANXIETY DEPRESSION   HYPERTENSION   CAD, NATIVE VESSEL   CHRONIC OBSTRUCTIVE PULMONARY DISEASE, MILD   1. Chest pain: Has some typical and atypical features for angina. Other DD: Musculoskeletal, rule out PE. Admit to telemetry for observation. Cycle cardiac enzymes. Continue aspirin and beta blockers. When necessary NTG. Cardiology consultation-possible stress testing in a.m. 2. Dyspnea: Etiology? COPD however no significant clinical bronchospasm. We'll check d-dimer and if positive will need CT angiogram of chest to rule out PE. Continue oxygen and bronchodilators. 3. Hypertension: Controlled. Continue home medications 4. Hyperlipidemia: Continue statins. 5. GERD: Continue PPIs. 6. Status post bioprosthetic aVR.     Code Status: Full  Family  Communication: None at bedside  Disposition Plan: Home when medically stable   Time spent: 6 minutes  Demarion Pondexter, MD, FACP, FHM. [facility] Pager [phone]  If 7PM-7AM, please contact night-coverage [email or web address] Password [facility] 11/05/2013, 6:26 PM

## 2013-11-05 NOTE — ED Provider Notes (Signed)
CSN: [id number]     Arrival date & time 11/05/13  1231 History   First MD Initiated Contact with Patient 11/05/13 1500     No chief complaint on file.  HPI Comments: 63 yo F hx of aortic stenosis s/p AVR, CAD s/p bypass, PCI, COPD, HLD, presents with CC of chest pain, SOB.  Pt states symptoms having been going on for 2 days.  C/o constant, heavy/pressure CP, R chest, R shoulder, which is somewhat pleuritic, and TTP, with associated dyspnea and nonproductive cough, worse with exertion.  Additionally she endorses some nausea as well.  She denies fever, chills, wheezing, abdominal pain, vomiting, diarrhea, rash, myalgias, rash, or any other symptoms.  Pt took two advil this morning for the pain, and spiriva with albuterol for her SOB, but without benefit.  Pain currently 7/10.  She denies any recent illnesses.  She states symptoms feel like prior MI.  Last heart cath 2012.  She denies any other recent stress testing, or catheterization.  No other complaints.    The history is provided by the patient. No language interpreter was used.    Past Medical History  Diagnosis Date  . AORTIC STENOSIS 07/07/2010    a. echo 12/23/10: EF 60-65%, mod to severe AS with mean gradient 29 mmHg  . Barrett's esophagus 04/14/2007  . CAD, NATIVE VESSEL 05/17/2010    a. NSTEMI 8/11 - BMS to RCA;  b. cath 4/12: dLAD 50%, RI 80% (PCI), AVCFX 30%, pRCA 30%, stent ok, 40-50, then 50% dist RCA;  c. s/p Promus DES to RI 12/25/10  . Carotid stenosis 05/17/2010    a. dopplers Q000111Q: RICA Q000111Q; LICA [id number] (follow up due 11/12)  . 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 Herpes simplex without mention of complication 123XX123  . [place], MILD 08/29/2009  . OSTEOPENIA 04/14/2007  . URINARY INCONTINENCE, STRESS, MILD 08/26/2007  . GERD (gastroesophageal reflux disease)   . Menopause   . Ovarian cyst   . Urine, incontinence, stress female   . Adenomatous colon polyp   . HYPERTENSION 04/14/2007  . Heart  murmur   . Anginal pain   . Myocardial infarction 2011    "during catherization" (03/25/2013)  . Hemorrhoid thrombosis     "had it lanced" (03/25/2013)  . Pneumonia     "multiple times" (03/25/2013)  . Chronic bronchitis     "multiple times" (03/25/2013)  . Exertional shortness of breath     "related to COPD problems" (03/25/2013)  . History of blood transfusion     'w/OHS" (03/25/2013)  . H/O hiatal hernia   . MIGRAINE HEADACHE 04/20/2008  . Migraine   . ANXIETY DEPRESSION 02/15/2009   Past Surgical History  Procedure Laterality Date  . Tonsillectomy and adenoidectomy    . Nasal sinus surgery  1997; 1998    "cust in maxillary sinus; put a stent in then removed it" (03/25/2013)  . Blepharoplasty Bilateral ~ 1998  . Thrombosed hemorrohid lanced    . Ovarian cyst removal Right 2011    "size of a soccer ball" (03/25/2013)  . Appendectomy    . Total abdominal hysterectomy    . Aortic valve replacement  2012  . Cardiac catheterization    . Coronary angioplasty with stent placement      "I have 2 stents" (03/25/2013)  . Cholecystectomy  03/25/2013    w/IOC (03/25/2013)  . Cholecystectomy N/A 03/25/2013    Procedure: LAPAROSCOPIC CHOLECYSTECTOMY WITH INTRAOPERATIVE CHOLANGIOGRAM;  Surgeon: Imogene Burn. Georgette Dover, MD;  Location: [facility]  [facility];  Service: General;  Laterality: N/A;   Family History  Problem Relation Age of Onset  . Coronary artery disease Other   . Heart disease Other     6 stents  . Heart disease Mother   . COPD Father   . Heart disease Father   . Heart disease Maternal Uncle    History  Substance Use Topics  . Smoking status: Former Smoker -- 1.50 packs/day for 43 years    Types: Cigarettes    Quit date: 09/03/2005  . Smokeless tobacco: Never Used  . Alcohol Use: Yes     Comment: 03/25/2013 "I have drank; very rare"   OB History   Grav Para Term Preterm Abortions TAB SAB Ect Mult Living                 Review of Systems  Constitutional: Negative for fever and chills.   Respiratory: Positive for cough and shortness of breath. Negative for wheezing and stridor.   Cardiovascular: Positive for chest pain. Negative for palpitations and leg swelling.  Gastrointestinal: Negative for nausea, vomiting, abdominal pain and diarrhea.  Musculoskeletal: Negative for myalgias.       R shoulder pain  Skin: Negative for rash.  Neurological: Negative for dizziness, weakness, light-headedness, numbness and headaches.  Hematological: Negative for adenopathy. Does not bruise/bleed easily.  All other systems reviewed and are negative.      Allergies  Codeine sulfate; Hydrocodone-acetaminophen; and Amoxicillin-pot clavulanate  Home Medications   Current Outpatient Rx  Name  Route  Sig  Dispense  Refill  . acyclovir (ZOVIRAX) 200 MG capsule      TAKE TWO CAPSULES BY MOUTH THREE TIMES DAILY AS NEEDED FOR  COLD  SORE   60 capsule   0   . albuterol (PROVENTIL HFA;VENTOLIN HFA) 108 (90 BASE) MCG/ACT inhaler   Inhalation   Inhale 2 puffs into the lungs every 6 (six) hours as needed.         Marland Kitchen aspirin 81 MG tablet   Oral   Take 81 mg by mouth daily.           . Butalbital-APAP-Caffeine 50-325-40 MG per capsule   Oral   Take 1 capsule by mouth every 4 (four) hours as needed for pain.   30 capsule   0   . CRESTOR 40 MG tablet      TAKE ONE TABLET BY MOUTH ONCE DAILY   30 tablet   2     .Marland KitchenPatient needs to contact office to schedule  App ...   . estradiol (VIVELLE-DOT) 0.05 MG/24HR   Transdermal   Place 1 patch onto the skin 2 (two) times a week.         . nadolol (CORGARD) 40 MG tablet   Oral   Take 1 tablet (40 mg total) by mouth 2 (two) times daily.   60 tablet   11   . omeprazole (PRILOSEC) 40 MG capsule   Oral   Take 1 capsule (40 mg total) by mouth daily.   30 capsule   3   . PRISTIQ 100 MG 24 hr tablet      TAKE ONE TABLET BY MOUTH ONCE DAILY   30 tablet   6   . SPIRIVA HANDIHALER 18 MCG inhalation capsule      INHALE ONE DOSE  EVERY DAY   30 capsule   6   . tiotropium (SPIRIVA) 18 MCG inhalation capsule   Inhalation   Place 18 mcg into inhaler  and inhale daily.         . traMADol (ULTRAM) 50 MG tablet   Oral   Take 1 tablet (50 mg total) by mouth every 8 (eight) hours as needed for pain.   30 tablet   0   . VIVELLE-DOT 0.05 MG/24HR patch      APPLY ONE PATCH TOPICALLY AND CHANGE TWICE A WEEK   10 patch   3    BP 148/105  Pulse 91  Temp(Src) 98.7 F (37.1 C) (Oral)  Resp 24  Ht 5\' 2"  (1.575 m)  Wt 146 lb (66.225 kg)  BMI 26.70 kg/m2  SpO2 95% Physical Exam  Nursing note and vitals reviewed. Constitutional: She is oriented to person, place, and time. She appears well-developed and well-nourished.  HENT:  Head: Normocephalic and atraumatic.  Right Ear: External ear normal.  Left Ear: External ear normal.  Nose: Nose normal.  Mouth/Throat: Oropharynx is clear and moist.  Eyes: Conjunctivae and EOM are normal. Pupils are equal, round, and reactive to light.  Neck: Normal range of motion. Neck supple.  Cardiovascular: Normal rate, regular rhythm, normal heart sounds and intact distal pulses.   Pulmonary/Chest: Effort normal and breath sounds normal. No respiratory distress. She has no wheezes. She has no rales. She exhibits tenderness.  Dimished breath sounds bilaterally.  No wheezes, rales, or rhonchi.  R chest wall, mid chest TTP.    Abdominal: Soft. Bowel sounds are normal. She exhibits no distension and no mass. There is no tenderness. There is no rebound and no guarding.  Musculoskeletal: Normal range of motion.  Neurological: She is alert and oriented to person, place, and time.  Skin: Skin is warm and dry.    ED Course  Procedures (including critical care time) Labs Review Labs Reviewed  BASIC METABOLIC PANEL - Abnormal; Notable for the following:    Sodium 135 (*)    Glucose, Bld 104 (*)    All other components within normal limits  PRO B NATRIURETIC PEPTIDE - Abnormal; Notable  for the following:    Pro B Natriuretic peptide (BNP) 140.5 (*)    All other components within normal limits  D-DIMER, QUANTITATIVE - Abnormal; Notable for the following:    D-Dimer, Quant 0.62 (*)    All other components within normal limits  CBC  TROPONIN I  TROPONIN I  TROPONIN I  I-STAT TROPOININ, ED   Imaging Review Dg Chest 2 View (if Patient Has Fever And/or Copd)  11/05/2013   CLINICAL DATA:  Chest pain  EXAM: CHEST  2 VIEW  COMPARISON:  04/01/2012  FINDINGS: Cardiac shadow is stable. Postsurgical changes are again noted. The previously seen nipple shadows are less well visualized on the current exam. Chronic changes in the left lobe lung base are seen. No acute abnormality is noted.  IMPRESSION: No active cardiopulmonary disease.   Electronically Signed   By: Inez Catalina M.D.   On: 11/05/2013 14:55     EKG Interpretation   Date/Time:  Thursday November 05 2013 12:36:29 EST Ventricular Rate:  83 PR Interval:  180 QRS Duration: 68 QT Interval:  366 QTC Calculation: 430 R Axis:   81 Text Interpretation:  Normal sinus rhythm Cannot rule out Anterior infarct  , age undetermined Abnormal ECG When compared with ECG of 05/21/2011, No  significant change was found Confirmed by Roxanne Mins  MD, DAVID ([id number]) on  11/05/2013 4:06:22 PM      MDM   Final diagnoses:  None   63 yo F  hx of aortic stenosis s/p AVR, CAD s/p bypass, PCI, COPD, HLD, presents with CC of chest pain, SOB.  Filed Vitals:   11/05/13 1241  BP: 148/105  Pulse: 91  Temp: 98.7 F (37.1 C)  Resp: 24   Physical exam as above.  VS WNL.  Pt in no distress.  C/o of CP, SOB which is worsening over last two days. DDx including ACS, COPD exacerbation, Pneumonia.  EKG NSR, no ischemic changes.  Will get CXR, troponin, BNP, CBC, BMP.  Will give ASA, pain meds, and reeval.  CXR is negative for active cardiopulmonary disease.  Troponin 0.00.  BNP 140.5.  CBC, BMP are WNL.    Unlikely PNA given no fever or productive cough,  with normal CXR.  Lungs sounds are diminished bilaterally, but no wheezing, no hypoxia, pt does not appear in distress.  Unlikely COPD exacerbation. PE unlikely no hypoxia, no tachycardia, no unilateral leg swelling, no recent surgery, or recent immobilization, and no hx of PE/DVT in past.   I have considered as well aortic dissection, however suspicion is low for this atypical H&P for this diagnoses, and vitals WNL, equal radial, femoral pulses bilaterally.  Unlikely pericarditis, or myocarditis given no recent fevers or illnesses, EKG nondiagnositic for these diagnosis.    Medicine consulted for admission given cardiac hx and symptoms.  Pt understands and agrees with plan.  Pt's care has been discussed with Dr. Roxanne Mins.   Sinda Du, MD      Sinda Du, MD 11/06/13 [phone]

## 2013-11-05 NOTE — Telephone Encounter (Signed)
Noted  

## 2013-11-05 NOTE — ED Provider Notes (Signed)
63 year old female status post coronary artery bypass has been having dyspnea for the last several days. Dyspnea is worse with exertion. Today, she has had a sharp pain in her anterior chest with radiation to right scapular area. This pain is worse with deep breath but not with movement or exertion. There's been no associated nausea, vomiting, diaphoresis. She called her PCP who recommended she come to the ED. On exam, of there's no neck vein distention, lungs are clear, heart has regular rate and rhythm, there is mild tenderness palpation over the lower left sternal border which the patient relates to her prior bypass surgery. Extremities have no cyanosis or edema. She'll need to be admitted for further cardiac evaluation.  I saw and evaluated the patient, reviewed the resident's note and I agree with the findings and plan.   EKG Interpretation   Date/Time:  Thursday November 05 2013 12:36:29 EST Ventricular Rate:  83 PR Interval:  180 QRS Duration: 68 QT Interval:  366 QTC Calculation: 430 R Axis:   81 Text Interpretation:  Normal sinus rhythm Cannot rule out Anterior infarct  , age undetermined Abnormal ECG When compared with ECG of 05/21/2011, No  significant change was found Confirmed by [facility]  MD, Durenda Pechacek ([id number]) on  11/05/2013 4:06:22 PM        Delora Fuel, MD 21/22/48 2500

## 2013-11-06 ENCOUNTER — Inpatient Hospital Stay (HOSPITAL_COMMUNITY): Payer: BC Managed Care – PPO

## 2013-11-06 ENCOUNTER — Encounter (HOSPITAL_COMMUNITY): Payer: Self-pay | Admitting: Radiology

## 2013-11-06 DIAGNOSIS — I10 Essential (primary) hypertension: Secondary | ICD-10-CM

## 2013-11-06 DIAGNOSIS — I059 Rheumatic mitral valve disease, unspecified: Secondary | ICD-10-CM

## 2013-11-06 DIAGNOSIS — Z954 Presence of other heart-valve replacement: Secondary | ICD-10-CM

## 2013-11-06 DIAGNOSIS — R0609 Other forms of dyspnea: Secondary | ICD-10-CM

## 2013-11-06 DIAGNOSIS — R072 Precordial pain: Secondary | ICD-10-CM

## 2013-11-06 DIAGNOSIS — R0789 Other chest pain: Secondary | ICD-10-CM

## 2013-11-06 DIAGNOSIS — R0989 Other specified symptoms and signs involving the circulatory and respiratory systems: Secondary | ICD-10-CM

## 2013-11-06 DIAGNOSIS — Z952 Presence of prosthetic heart valve: Secondary | ICD-10-CM

## 2013-11-06 DIAGNOSIS — R079 Chest pain, unspecified: Secondary | ICD-10-CM

## 2013-11-06 DIAGNOSIS — I359 Nonrheumatic aortic valve disorder, unspecified: Secondary | ICD-10-CM

## 2013-11-06 LAB — TROPONIN I
Troponin I: <0.3 ng/mL (ref <0.30)
Troponin I: <0.3 ng/mL (ref <0.30)

## 2013-11-06 MED ORDER — REGADENOSON 0.4 MG/5ML IV SOLN
INTRAVENOUS | Status: AC
  Filled 2013-11-06: qty 5

## 2013-11-06 MED ORDER — ZOLPIDEM TARTRATE 5 MG PO TABS
5.0000 mg | ORAL_TABLET | Freq: Once | ORAL | Status: AC
  Administered 2013-11-06: 5 mg via ORAL
  Filled 2013-11-06: qty 1

## 2013-11-06 MED ORDER — IOHEXOL 350 MG/ML SOLN
100.0000 mL | Freq: Once | INTRAVENOUS | Status: AC | PRN
  Administered 2013-11-06: 100 mL via INTRAVENOUS

## 2013-11-06 MED ORDER — TECHNETIUM TC 99M SESTAMIBI GENERIC - CARDIOLITE
30.0000 | Freq: Once | INTRAVENOUS | Status: AC | PRN
  Administered 2013-11-06: 30 via INTRAVENOUS

## 2013-11-06 MED ORDER — REGADENOSON 0.4 MG/5ML IV SOLN
0.4000 mg | Freq: Once | INTRAVENOUS | Status: AC
  Administered 2013-11-06: 0.4 mg via INTRAVENOUS
  Filled 2013-11-06: qty 5

## 2013-11-06 MED ORDER — TECHNETIUM TC 99M SESTAMIBI GENERIC - CARDIOLITE
10.0000 | Freq: Once | INTRAVENOUS | Status: AC | PRN
  Administered 2013-11-06: 10 via INTRAVENOUS

## 2013-11-06 NOTE — Progress Notes (Signed)
a  Patient Name: Katherine Foley Date of Encounter: 11/06/2013   Principal Problem:   Midsternal chest pain Active Problems:   CAD, NATIVE VESSEL   HYPERTENSION   HYPERLIPIDEMIA, MILD   ANXIETY DEPRESSION   CHRONIC OBSTRUCTIVE PULMONARY DISEASE, MILD   S/P AVR (aortic valve replacement)   SUBJECTIVE  She has continued to have intermittent chest discomfort that his worse with deep breathing and palpation.  Despite prolonged Ss, Troponin is nl.  She's sched for cardiolite today.  CURRENT MEDS . aspirin EC  325 mg Oral Daily  . atorvastatin  80 mg Oral q1800  . enoxaparin (LOVENOX) injection  40 mg Subcutaneous Q24H  . nadolol  40 mg Oral BID  . pantoprazole  80 mg Oral Daily  . sodium chloride  3 mL Intravenous Q12H  . tiotropium  18 mcg Inhalation Daily    OBJECTIVE  Filed Vitals:   11/05/13 1834 11/05/13 1859 11/05/13 2059 11/06/13 0601  BP: 134/86 132/79 110/64 87/57  Pulse: 72 73 77 84  Temp: 98.3 F (36.8 C) 98.2 F (36.8 C) 98.4 F (36.9 C) 98.9 F (37.2 C)  TempSrc: Oral Oral Oral Oral  Resp: 16 16 18 18   Height:      Weight:      SpO2: 98% 100% 94% 91%    Intake/Output Summary (Last 24 hours) at 11/06/13 1009 Last data filed at 11/05/13 2140  Gross per 24 hour  Intake      3 ml  Output      0 ml  Net      3 ml   Filed Weights   11/05/13 1241  Weight: 146 lb (66.225 kg)   PHYSICAL EXAM  General: Pleasant, NAD. Neuro: Alert and oriented X 3. Moves all extremities spontaneously. Psych: Normal affect. HEENT:  Normal  Neck: Supple without bruits or JVD. Lungs:  Resp regular and unlabored, CTA. Heart: RRR no s3, s4, or murmurs.  Mild sternal tenderness. Abdomen: Soft, non-tender, non-distended, BS + x 4.  Extremities: No clubbing, cyanosis or edema. DP/PT/Radials 2+ and equal bilaterally.  Accessory Clinical Findings  CBC  Recent Labs  11/05/13 1308  WBC 6.9  HGB 13.5  HCT 40.6  MCV 89.8  PLT 315   Basic Metabolic Panel  Recent Labs  11/05/13 1308  NA 135*  K 4.2  CL 97  CO2 26  GLUCOSE 104*  BUN 10  CREATININE 0.58  CALCIUM 9.3   Cardiac Enzymes  Recent Labs  11/05/13 2022 11/06/13 0100 11/06/13 0620  TROPONINI <0.30 <0.30 <0.30   D-Dimer  Recent Labs  11/05/13 2022  DDIMER 0.62*   TELE  Seen in [facility]  Radiology/Studies  Dg Chest 2 View (if Patient Has Fever And/or Copd)  11/05/2013   CLINICAL DATA:  Chest pain  EXAM: CHEST  2 VIEW   IMPRESSION: No active cardiopulmonary disease.   Electronically Signed   By: Inez Catalina M.D.   On: 11/05/2013 14:55   Ct Angio Chest Pe W/cm &/or Wo Cm  11/06/2013   CLINICAL DATA:  Elevated D-dimer and shortness of breath.  EXAM: CT ANGIOGRAPHY CHEST WITH CONTRAST   IMPRESSION: 1. Negative for pulmonary embolism. 2. Status post CABG.  No visible flow within the LIMA graft. 3. Diffuse bronchial wall thickening, likely airway predominant COPD. 4. Trace right pleural effusion. 5. Dilated ascending aorta at 39 mm.   Electronically Signed   By: Jorje Guild M.D.   On: 11/06/2013 03:38   ASSESSMENT AND PLAN  1.  Midsternal chest pain/CAD:  She continues to have intermittent midsternal chest pain that is worsened by palpation and deep breathing.  Ss occur for hours @ a time and she reports chest pain this morning prior to her exercise cardiolite.  Despite prolonged Ss, troponin remains negative.  Await cardiolite results.  Of note, CTA of the chest performed this AM incidentally showed no filling of the LIMA->LAD.  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  Cont asa, statin, bb.  2.  S/P Bioprosthetic AVR:  For echo today.  3.  HTN:  Stable.  4.  HL:  On statin.  Signed, Murray Hodgkins NP  Patient seen and examined. Agree with assessment and plan. Developed dignificant dyspnea an nuclear study. Await image analysis. For echo today. No flow seen in LIMA graft on CT angio. 2/6 SEM in aortic region c/w AVR.   Troy Sine, MD, [facility] 11/06/2013 12:15 PM

## 2013-11-06 NOTE — Progress Notes (Signed)
Physician Discharge Summary  Katherine Foley LOV:[id number] DOB: 1951-08-24 DOA: 11/05/2013  PCP: Katherine Haber, MD  Admit date: 11/05/2013 Discharge date: 11/06/2013  Time spent: *50 minutes  Recommendations for Outpatient Follow-up:  1. Follow up PCP in 2 weeks  Discharge Diagnoses:  Principal Problem:   Midsternal chest pain Active Problems:   HYPERLIPIDEMIA, MILD   ANXIETY DEPRESSION   HYPERTENSION   CAD, NATIVE VESSEL   CHRONIC OBSTRUCTIVE PULMONARY DISEASE, MILD   S/P AVR (aortic valve replacement)   Discharge Condition: Stable  Diet recommendation: low salt diet  Filed Weights   11/05/13 1241  Weight: 66.225 kg (146 lb)    History of present illness:  63 y.o. female with history of MI/CAD, CABG, aortic stenosis status post bioprosthetic aVR, COPD, HLD, GERD, hypertension, former smoker, presented to the ED with complaints of worsening dyspnea and new onset of chest pain. She started having dyspnea approximately 4 days ago which was gradual in onset, associated with wheezing, dry cough, no fever or chills. She continued to use her Spiriva and when necessary albuterol inhalers without significant relief. Her dyspnea has progressively worsened over the last couple of days. No sickly contacts. No recent long-distance travel, asymmetrical in swelling or pain. She woke up this morning with severe sharp right shoulder and right upper back pain. She denied sleeping in an abnormal position, lifting anything unusually heavy or trauma. Pain was rated as 10/10 in severity, not made worse by movements of right shoulder. Over the course of the next hour or 2 the pain moved to upper mid chest. This was made worse with deep inspiration. She called her PCPs office and was advised to come to the ED. She states that she got a dose of sublingual nitroglycerin and her anterior chest pain and right upper back pain improved from 10/5 to 5/5 but shoulder pain persisted. She subsequently got IV morphine  and her right shoulder pain started to subside. She indicates that her right upper back pain is similar to the pain that she had when she had her heart attack in 2011. In the ED, chest x-ray and EKG without acute findings and troponin x1 negative. Hospitalist admission requested. Patient denies history of stress test or angiogram since her CABG in 2012   Hospital Course:  Chest pain- patient came with mid sternal chest pain, CTA was negative for PE. Cardiac enzymes x 3 are negative, She underwent  Myoview nuclear stress test which did not show ischemia. Cardiology has cleared her for discharge.  Bioprosthetic AVR- echo has been done , results Aortic valve: Bioprosthetic aortic valve - leaflets not well visualized. No rocking motion or paravalvular leak. Transvalvular velocity was minimally increased. Peak and mean gradients are 14 mmHg and 7 mmHg, which is minimally changed from the prior study, but acceptable for a bioprosthetic valve in this position.   Procedures:  Nuclear Myoview stress test  2d echocardiogram  Consultations:  cardiology  Discharge Exam: Filed Vitals:   11/06/13 1032  BP: 111/69  Pulse: 94  Temp:   Resp:     General: appear in no acute distress Cardiovascular: s1s2 RRR Respiratory: Clear bilaterally  Discharge Instructions  Discharge Orders   Future Appointments Provider Department Dept Phone   04/30/2014 8:00 AM [facility] [place] at [place]   05/07/2014 3:00 PM Ricard Dillon, MD [place] at [place]   Future Orders Complete By Expires   Diet - low sodium heart healthy  As directed    Increase  activity slowly  As directed        Medication List         albuterol 108 (90 BASE) MCG/ACT inhaler  Commonly known as:  PROVENTIL HFA;VENTOLIN HFA  Inhale 2 puffs into the lungs every 6 (six) hours as needed.     aspirin 81 MG tablet  Take 81 mg by mouth daily.     Biotin 10 MG Tabs  Take 10 mg  by mouth daily.     estradiol 0.05 MG/24HR patch  Commonly known as:  VIVELLE-DOT  Place 1 patch onto the skin 2 (two) times a week. Thursday and Monday     nadolol 40 MG tablet  Commonly known as:  CORGARD  Take 1 tablet (40 mg total) by mouth 2 (two) times daily.     omeprazole 40 MG capsule  Commonly known as:  PRILOSEC  Take 1 capsule (40 mg total) by mouth daily.     rosuvastatin 40 MG tablet  Commonly known as:  CRESTOR  Take 40 mg by mouth daily.     tiotropium 18 MCG inhalation capsule  Commonly known as:  SPIRIVA  Place 18 mcg into inhaler and inhale daily.       Allergies  Allergen Reactions  . Codeine Sulfate Rash    REACTION: unspecified  . Hydrocodone-Acetaminophen Rash    REACTION: unspecified  . Amoxicillin-Pot Clavulanate     REACTION: unspecified       Follow-up Information   Follow up with Katherine Haber, MD In 2 weeks.   Specialty:  Internal Medicine   Contact information:   [place] [address] [phone]        The results of significant diagnostics from this hospitalization (including imaging, microbiology, ancillary and laboratory) are listed below for reference.    Significant Diagnostic Studies: Dg Chest 2 View (if Patient Has Fever And/or Copd)  11/05/2013   CLINICAL DATA:  Chest pain  EXAM: CHEST  2 VIEW  COMPARISON:  04/01/2012  FINDINGS: Cardiac shadow is stable. Postsurgical changes are again noted. The previously seen nipple shadows are less well visualized on the current exam. Chronic changes in the left lobe lung base are seen. No acute abnormality is noted.  IMPRESSION: No active cardiopulmonary disease.   Electronically Signed   By: Inez Catalina M.D.   On: 11/05/2013 14:55   Ct Angio Chest Pe W/cm &/or Wo Cm  11/06/2013   CLINICAL DATA:  Elevated D-dimer and shortness of breath.  EXAM: CT ANGIOGRAPHY CHEST WITH CONTRAST  TECHNIQUE: Multidetector CT imaging of the chest was performed using the standard  protocol during bolus administration of intravenous contrast. Multiplanar CT image reconstructions and MIPs were obtained to evaluate the vascular anatomy.  CONTRAST:  11mL OMNIPAQUE IOHEXOL 350 MG/ML SOLN  COMPARISON:  05/21/2011  FINDINGS: THORACIC INLET/BODY WALL:  No acute abnormality.  MEDIASTINUM:  Normal heart size. No pericardial effusion. Status post aortic valve replacement. No para valvular collection identified. The ascending aorta measures 39 mm maximal diameter, similar to 2012. Status post CABG. No visible flow within the LIMA graft. Incidental lipomatous hypertrophy of interatrial septum. No evidence of pulmonary embolism. No acute aortic abnormality identified. No lymphadenopathy.  LUNG WINDOWS:  Trace right pleural effusion. Diffuse bronchial wall thickening. The lungs are hyperinflated.  UPPER ABDOMEN:  No acute findings.  OSSEOUS:  No acute fracture.  No suspicious lytic or blastic lesions.  IMPRESSION: 1. Negative for pulmonary embolism. 2. Status post CABG.  No visible flow  within the LIMA graft. 3. Diffuse bronchial wall thickening, likely airway predominant COPD. 4. Trace right pleural effusion. 5. Dilated ascending aorta at 39 mm.   Electronically Signed   By: Jorje Guild M.D.   On: 11/06/2013 03:38   Nm Myocar Multi W/spect W/wall Motion / Ef  11/06/2013   CLINICAL DATA:  Chest pain, post CABG.  Hypertension, smoker.  EXAM: MYOCARDIAL IMAGING WITH SPECT (REST AND PHARMACOLOGIC-STRESS)  GATED LEFT VENTRICULAR WALL MOTION STUDY  LEFT VENTRICULAR EJECTION FRACTION  TECHNIQUE: Standard myocardial SPECT imaging was performed after resting intravenous injection of 10 mCi Tc-37m sestamibi. Subsequently, intravenous infusion of Lexiscan was performed under the supervision of the Cardiology staff. At peak effect of the drug, 30 mCi Tc-60m sestamibi was injected intravenously and standard myocardial SPECT imaging was performed. Quantitative gated imaging was also performed to evaluate left  ventricular wall motion, and estimate left ventricular ejection fraction.  COMPARISON:  None.  FINDINGS: SPECT imaging demonstrates mild decreased activity in the anteroseptal wall on both rest and stress images. This area moves and thickens normally and is felt represent soft tissue attenuation. No other reversible or irreversible defects.  Quantitative gated analysis shows normal wall motion.  The resting left ventricular ejection fraction is 64% with end-diastolic volume of 46 ml and end-systolic volume of 7 ml.  IMPRESSION: Probable anterior soft tissue attenuation. No convincing evidence for fixed or reversible defect.  Ejection fraction 84%.   Electronically Signed   By: Rolm Baptise M.D.   On: 11/06/2013 14:59    Microbiology: No results found for this or any previous visit (from the past 240 hour(s)).   Labs: Basic Metabolic Panel:  Recent Labs Lab 11/05/13 1308  NA 135*  K 4.2  CL 97  CO2 26  GLUCOSE 104*  BUN 10  CREATININE 0.58  CALCIUM 9.3   Liver Function Tests: No results found for this basename: AST, ALT, ALKPHOS, BILITOT, PROT, ALBUMIN,  in the last 168 hours No results found for this basename: LIPASE, AMYLASE,  in the last 168 hours No results found for this basename: AMMONIA,  in the last 168 hours CBC:  Recent Labs Lab 11/05/13 1308  WBC 6.9  HGB 13.5  HCT 40.6  MCV 89.8  PLT 185   Cardiac Enzymes:  Recent Labs Lab 11/05/13 2022 11/06/13 0100 11/06/13 0620  TROPONINI <0.30 <0.30 <0.30   BNP: BNP (last 3 results)  Recent Labs  11/05/13 1308  PROBNP 140.5*   CBG: No results found for this basename: GLUCAP,  in the last 168 hours     Signed:  LAMA,GAGAN S  [facility] 11/06/2013, 3:53 PM

## 2013-11-06 NOTE — Progress Notes (Signed)
Echocardiogram 2D Echocardiogram has been performed.  Katherine Foley 11/06/2013, 2:32 PM

## 2013-11-17 NOTE — Discharge Summary (Signed)
Admit date: 11/05/2013 Discharge date: 11/06/2013   Time spent: *50 minutes   Recommendations for Outpatient Follow-up:   Follow up PCP in 2 weeks   Discharge Diagnoses:   Principal Problem:   Midsternal chest pain Active Problems:   HYPERLIPIDEMIA, MILD   ANXIETY DEPRESSION   HYPERTENSION   CAD, NATIVE VESSEL   CHRONIC OBSTRUCTIVE PULMONARY DISEASE, MILD   S/P AVR (aortic valve replacement)   Discharge Condition: Stable   Diet recommendation: low salt diet    Filed Weights     11/05/13 1241   Weight:  66.225 kg (146 lb)      History of present illness:   63 y.o. female with history of MI/CAD, CABG, aortic stenosis status post bioprosthetic aVR, COPD, HLD, GERD, hypertension, former smoker, presented to the ED with complaints of worsening dyspnea and new onset of chest pain. She started having dyspnea approximately 4 days ago which was gradual in onset, associated with wheezing, dry cough, no fever or chills. She continued to use her Spiriva and when necessary albuterol inhalers without significant relief. Her dyspnea has progressively worsened over the last couple of days. No sickly contacts. No recent long-distance travel, asymmetrical in swelling or pain. She woke up this morning with severe sharp right shoulder and right upper back pain. She denied sleeping in an abnormal position, lifting anything unusually heavy or trauma. Pain was rated as 10/10 in severity, not made worse by movements of right shoulder. Over the course of the next hour or 2 the pain moved to upper mid chest. This was made worse with deep inspiration. She called her PCPs office and was advised to come to the ED. She states that she got a dose of sublingual nitroglycerin and her anterior chest pain and right upper back pain improved from 10/5 to 5/5 but shoulder pain persisted. She subsequently got IV morphine and her right shoulder pain started to subside. She indicates that her right upper back pain is similar  to the pain that she had when she had her heart attack in 2011. In the ED, chest x-ray and EKG without acute findings and troponin x1 negative. Hospitalist admission requested. Patient denies history of stress test or angiogram since her CABG in 2012     Hospital Course:  Chest pain- patient came with mid sternal chest pain, CTA was negative for PE. Cardiac enzymes x 3 are negative, She underwent  Myoview nuclear stress test which did not show ischemia. Cardiology has cleared her for discharge.   Bioprosthetic AVR- echo has been done , results Aortic valve: Bioprosthetic aortic valve - leaflets not well visualized. No rocking motion or paravalvular leak. Transvalvular velocity was minimally increased. Peak and mean gradients are 14 mmHg and 7 mmHg, which is minimally changed from the prior study, but acceptable for a bioprosthetic valve in this position.     Procedures: Nuclear Myoview stress test 2d echocardiogram   Consultations: cardiology   Discharge Exam: Filed Vitals:     11/06/13 1032   BP:  111/69   Pulse:  94   Temp:     Resp:        General: appear in no acute distress Cardiovascular: s1s2 RRR Respiratory: Clear bilaterally   Discharge Instructions    Discharge Orders     Future Appointments  Provider  Department  Dept Phone     04/30/2014 8:00 AM  [facility]  [place] at [place]     05/07/2014 3:00 PM  Ricard Dillon,  MD  [place] at [place]     Future Orders  Complete By  Expires     Diet - low sodium heart healthy   As directed       Increase activity slowly   As directed            Medication List              albuterol 108 (90 BASE) MCG/ACT inhaler   Commonly known as:  PROVENTIL HFA;VENTOLIN HFA   Inhale 2 puffs into the lungs every 6 (six) hours as needed.         aspirin 81 MG tablet   Take 81 mg by mouth daily.         Biotin 10 MG Tabs   Take 10 mg by mouth daily.          estradiol 0.05 MG/24HR patch   Commonly known as:  VIVELLE-DOT   Place 1 patch onto the skin 2 (two) times a week. Thursday and Monday         nadolol 40 MG tablet   Commonly known as:  CORGARD   Take 1 tablet (40 mg total) by mouth 2 (two) times daily.         omeprazole 40 MG capsule   Commonly known as:  PRILOSEC   Take 1 capsule (40 mg total) by mouth daily.         rosuvastatin 40 MG tablet   Commonly known as:  CRESTOR   Take 40 mg by mouth daily.         tiotropium 18 MCG inhalation capsule   Commonly known as:  SPIRIVA   Place 18 mcg into inhaler and inhale daily.           Allergies   Allergen  Reactions   .  Codeine Sulfate  Rash       REACTION: unspecified   .  Hydrocodone-Acetaminophen  Rash       REACTION: unspecified   .  Amoxicillin-Pot Clavulanate         REACTION: unspecified          Follow-up Information     Follow up with [name], MD In 2 weeks.     Specialty:  Internal Medicine     Contact information:     [place] [address] [phone]           --------------------------------------------------------------------------------   The results of significant diagnostics from this hospitalization (including imaging, microbiology, ancillary and laboratory) are listed below for reference.       Significant Diagnostic Studies: Dg Chest 2 View (if Patient Has Fever And/or Copd)   11/05/2013   CLINICAL DATA:  Chest pain  EXAM: CHEST  2 VIEW  COMPARISON:  04/01/2012  FINDINGS: Cardiac shadow is stable. Postsurgical changes are again noted. The previously seen nipple shadows are less well visualized on the current exam. Chronic changes in the left lobe lung base are seen. No acute abnormality is noted.  IMPRESSION: No active cardiopulmonary disease.   Electronically Signed   By: Inez Catalina M.D.   On: 11/05/2013 14:55    Ct Angio Chest Pe W/cm &/or Wo Cm   11/06/2013   CLINICAL DATA:  Elevated D-dimer and  shortness of breath.  EXAM: CT ANGIOGRAPHY CHEST WITH CONTRAST  TECHNIQUE: Multidetector CT imaging of the chest was performed using the standard protocol during bolus administration of intravenous contrast. Multiplanar CT image reconstructions and  MIPs were obtained to evaluate the vascular anatomy.  CONTRAST:  143mL OMNIPAQUE IOHEXOL 350 MG/ML SOLN  COMPARISON:  05/21/2011  FINDINGS: THORACIC INLET/BODY WALL:  No acute abnormality.  MEDIASTINUM:  Normal heart size. No pericardial effusion. Status post aortic valve replacement. No para valvular collection identified. The ascending aorta measures 39 mm maximal diameter, similar to 2012. Status post CABG. No visible flow within the LIMA graft. Incidental lipomatous hypertrophy of interatrial septum. No evidence of pulmonary embolism. No acute aortic abnormality identified. No lymphadenopathy.  LUNG WINDOWS:  Trace right pleural effusion. Diffuse bronchial wall thickening. The lungs are hyperinflated.  UPPER ABDOMEN:  No acute findings.  OSSEOUS:  No acute fracture.  No suspicious lytic or blastic lesions.  IMPRESSION: 1. Negative for pulmonary embolism. 2. Status post CABG.  No visible flow within the LIMA graft. 3. Diffuse bronchial wall thickening, likely airway predominant COPD. 4. Trace right pleural effusion. 5. Dilated ascending aorta at 39 mm.   Electronically Signed   By: Jorje Guild M.D.   On: 11/06/2013 03:38    Nm Myocar Multi W/spect W/wall Motion / Ef   11/06/2013   CLINICAL DATA:  Chest pain, post CABG.  Hypertension, smoker.  EXAM: MYOCARDIAL IMAGING WITH SPECT (REST AND PHARMACOLOGIC-STRESS)  GATED LEFT VENTRICULAR WALL MOTION STUDY  LEFT VENTRICULAR EJECTION FRACTION  TECHNIQUE: Standard myocardial SPECT imaging was performed after resting intravenous injection of 10 mCi Tc-40m sestamibi. Subsequently, intravenous infusion of Lexiscan was performed under the supervision of the Cardiology staff. At peak effect of the drug, 30 mCi Tc-72m  sestamibi was injected intravenously and standard myocardial SPECT imaging was performed. Quantitative gated imaging was also performed to evaluate left ventricular wall motion, and estimate left ventricular ejection fraction.  COMPARISON:  None.  FINDINGS: SPECT imaging demonstrates mild decreased activity in the anteroseptal wall on both rest and stress images. This area moves and thickens normally and is felt represent soft tissue attenuation. No other reversible or irreversible defects.  Quantitative gated analysis shows normal wall motion.  The resting left ventricular ejection fraction is 62% with end-diastolic volume of 46 ml and end-systolic volume of 7 ml.  IMPRESSION: Probable anterior soft tissue attenuation. No convincing evidence for fixed or reversible defect.  Ejection fraction 84%.   Electronically Signed   By: Rolm Baptise M.D.   On: 11/06/2013 14:59      Microbiology: No results found for this or any previous visit (from the past 240 hour(s)).    Labs: Basic Metabolic Panel: Recent Labs Lab  11/05/13 1308   NA  135*   K  4.2   CL  97   CO2  26   GLUCOSE  104*   BUN  10   CREATININE  0.58   CALCIUM  9.3    Liver Function Tests: No results found for this basename: AST, ALT, ALKPHOS, BILITOT, PROT, ALBUMIN,  in the last 168 hours No results found for this basename: LIPASE, AMYLASE,  in the last 168 hours No results found for this basename: AMMONIA,  in the last 168 hours CBC: Recent Labs Lab  11/05/13 1308   WBC  6.9   HGB  13.5   HCT  40.6   MCV  89.8   PLT  185    Cardiac Enzymes: Recent Labs Lab  11/05/13 2022  11/06/13 0100  11/06/13 0620   TROPONINI  <0.30  <0.30  <0.30    BNP: BNP (last 3 results) Recent Labs   11/05/13 1308  PROBNP  140.5*    CBG: No results found for this basename: GLUCAP,  in the last 168 hours         Signed:   Kanchan Gal S           [facility] 11/06/2013, 3:53 PM

## 2013-11-25 ENCOUNTER — Ambulatory Visit (INDEPENDENT_AMBULATORY_CARE_PROVIDER_SITE_OTHER): Payer: BC Managed Care – PPO | Admitting: Family Medicine

## 2013-11-25 ENCOUNTER — Encounter: Payer: Self-pay | Admitting: Family Medicine

## 2013-11-25 VITALS — BP 134/74 | Temp 98.0°F | Wt 149.0 lb

## 2013-11-25 DIAGNOSIS — J449 Chronic obstructive pulmonary disease, unspecified: Secondary | ICD-10-CM

## 2013-11-25 DIAGNOSIS — J069 Acute upper respiratory infection, unspecified: Secondary | ICD-10-CM

## 2013-11-25 MED ORDER — ALBUTEROL SULFATE HFA 108 (90 BASE) MCG/ACT IN AERS: 2.0000 | INHALATION_SPRAY | Freq: Four times a day (QID) | RESPIRATORY_TRACT | Status: DC | PRN

## 2013-11-25 MED ORDER — PREDNISONE 20 MG PO TABS: 40.0000 mg | ORAL_TABLET | Freq: Every day | ORAL | Status: DC

## 2013-11-25 NOTE — Progress Notes (Signed)
Pre visit review using our clinic review tool, if applicable. No additional management support is needed unless otherwise documented below in the visit note. 

## 2013-11-25 NOTE — Patient Instructions (Signed)
Chronic Obstructive Pulmonary Disease Chronic obstructive pulmonary disease (COPD) is a common lung problem. In COPD, the flow of air from the lungs is limited. The way your lungs work will probably never return to normal, but there are things you can do to improve you lungs and make yourself feel better. HOME CARE  Take all medicines as told by your doctor.  Only take over-the-counter or prescription medicines as told by your doctor.  Avoid medicines or cough syrups that dry up your airway (such as antihistamines) and do not allow you to get rid of thick spit. You do not need to avoid them if told differently by your doctor.  If you smoke, stop. Smoking makes the problem worse.  Avoid being around things that make your breathing worse (like smoke, chemicals, and fumes).  Use oxygen therapy and therapy to help improve your lungs (pulmonary rehabilitation) if told by your doctor. If you need home oxygen therapy, ask your doctor if you should buy a tool to measure your oxygen level (oximeter).  Avoid people who have a sickness you can catch (contagious).  Avoid going outside when it is very hot, cold, or humid.  Eat healthy foods. Eat smaller meals more often. Rest before meals.  Stay active, but remember to also rest.  Make sure to get all the shots (vaccines) your doctor recommends. Ask your doctor if you need a pneumonia shot.  Learn and use tips on how to relax.  Learn and use tips on how to control your breathing as told by your doctor. Try:  Breathing in (inhaling) through your nose for 1 second. Then, pucker your lips and breath out (exhale) through your lips for 2 seconds.  Putting one hand on your belly (abdomen). Breathe in slowly through your nose for 1 second. Your hand on your belly should move out. Pucker your lips and breathe out slowly through your lips. Your hand on your belly should move in as you breathe out.  Learn and use controlled coughing to clear thick spit  from your lungs. 1. Lean your head a little forward. 2. Breathe in deeply. 3. Try to hold your breath for 3 seconds. 4. Keep your mouth slightly open while coughing 2 times. 5. Spit any thick spit out into a tissue. 6. Rest and do the steps again 1 or 2 times as needed. GET HELP IF:  You cough up more thick spit than usual.  There is a change in the color or thickness of the spit.  It is harder to breathe than usual.  Your breathing is faster than usual. GET HELP RIGHT AWAY IF:   You have shortness of breath while resting.  You have shortness of breath that stops you from:  Being able to talk.  Doing normal activities.  You chest hurts for longer than 5 minutes.  Your skin color is more blue than usual.  Your pulse oximeter shows that you have low oxygen for longer than 5 minutes. MAKE SURE YOU:   Understand these instructions.  Will watch your condition.  Will get help right away if you are not doing well or get worse. Document Released: 02/06/2008 Document Revised: 06/10/2013 Document Reviewed: 04/16/2013 [employer] Patient Information 2014 [employer].  

## 2013-11-25 NOTE — Progress Notes (Signed)
Chief Complaint  Patient presents with  . Sinusitis    HPI:  Cough -started: 4 days ago and worsening -symptoms:nasal congestion, cough - her main concern is productive cough, wheezing, mild SOB -denies:fever, SOB, NVD, tooth pain -has tried: nothing -sick contacts/travel/risks: denies flu exposure or Ebola risks -Hx of: COPD and reports often needs prednisone when get this way and needs refill on alb - reports pcp usually give her samples  ROS: See pertinent positives and negatives per HPI.  Past Medical History  Diagnosis Date  . AORTIC STENOSIS 07/07/2010    a. echo 12/23/10: EF 60-65%, mod to severe AS with mean gradient 29 mmHg;  b. 04/2011 s/p bioprosthetic AVR.  Marland Kitchen Barrett's esophagus 04/14/2007  . CAD, NATIVE VESSEL 05/17/2010    a. NSTEMI 8/11 - BMS to RCA;  b. cath 4/12: dLAD 50%, RI 80% (PCI), AVCFX 30%, pRCA 30%, stent ok, 40-50, then 50% dist RCA;  c. s/p Promus DES to RI 12/25/10;  d. 04/2011 CABG x 1 (LIMA->LAD) @ time of AVR  . Carotid stenosis 05/17/2010    a. dopplers 50/09: RICA 38-18%; LICA 2-99% (follow up due 11/12)  . 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 Herpes simplex without mention of complication 3/71/6967  . [place], MILD 08/29/2009  . OSTEOPENIA 04/14/2007  . URINARY INCONTINENCE, STRESS, MILD 08/26/2007  . GERD (gastroesophageal reflux disease)   . Menopause   . Ovarian cyst   . Urine, incontinence, stress female   . Adenomatous colon polyp   . HYPERTENSION 04/14/2007  . Heart murmur   . Anginal pain   . Myocardial infarction 2011    "during catherization" (03/25/2013)  . Hemorrhoid thrombosis     "had it lanced" (03/25/2013)  . Pneumonia     "multiple times" (03/25/2013)  . Chronic bronchitis     "multiple times" (03/25/2013)  . Exertional shortness of breath     "related to COPD problems" (03/25/2013)  . History of blood transfusion     'w/OHS" (03/25/2013)  . H/O hiatal hernia   . MIGRAINE HEADACHE 04/20/2008  . Migraine    . ANXIETY DEPRESSION 02/15/2009    Past Surgical History  Procedure Laterality Date  . Tonsillectomy and adenoidectomy    . Nasal sinus surgery  1997; 1998    "cust in maxillary sinus; put a stent in then removed it" (03/25/2013)  . Blepharoplasty Bilateral ~ 1998  . Thrombosed hemorrohid lanced    . Ovarian cyst removal Right 2011    "size of a soccer ball" (03/25/2013)  . Appendectomy    . Total abdominal hysterectomy    . Aortic valve replacement  2012  . Cardiac catheterization    . Coronary angioplasty with stent placement      "I have 2 stents" (03/25/2013)  . Cholecystectomy  03/25/2013    w/IOC (03/25/2013)  . Cholecystectomy N/A 03/25/2013    Procedure: LAPAROSCOPIC CHOLECYSTECTOMY WITH INTRAOPERATIVE CHOLANGIOGRAM;  Surgeon: Imogene Burn. Georgette Dover, MD;  Location: [facility];  Service: General;  Laterality: N/A;    Family History  Problem Relation Age of Onset  . Coronary artery disease Other   . Heart disease Other     6 stents  . Heart disease Mother   . COPD Father   . Heart disease Father   . Heart disease Maternal Uncle     History   Social History  . Marital Status: Single    Spouse Name: N/A    Number of Children: N/A  . Years of Education:  N/A   Social History Main Topics  . Smoking status: Former Smoker -- 1.50 packs/day for 43 years    Types: Cigarettes    Quit date: 09/03/2005  . Smokeless tobacco: Never Used  . Alcohol Use: Yes     Comment: 03/25/2013 "I have drank; very rare"  . Drug Use: No  . Sexual Activity: No   Other Topics Concern  . None   Social History Narrative  . None    Current outpatient prescriptions:albuterol (PROVENTIL HFA;VENTOLIN HFA) 108 (90 BASE) MCG/ACT inhaler, Inhale 2 puffs into the lungs every 6 (six) hours as needed., Disp: 1 Inhaler, Rfl: 1;  aspirin 81 MG tablet, Take 81 mg by mouth daily.  , Disp: , Rfl: ;  Biotin 10 MG TABS, Take 10 mg by mouth daily., Disp: , Rfl: ;  estradiol (VIVELLE-DOT) 0.05 MG/24HR, Place 1 patch  onto the skin 2 (two) times a week. Thursday and Monday, Disp: , Rfl:  nadolol (CORGARD) 40 MG tablet, Take 1 tablet (40 mg total) by mouth 2 (two) times daily., Disp: 60 tablet, Rfl: 11;  omeprazole (PRILOSEC) 40 MG capsule, Take 1 capsule (40 mg total) by mouth daily., Disp: 30 capsule, Rfl: 3;  rosuvastatin (CRESTOR) 40 MG tablet, Take 40 mg by mouth daily., Disp: , Rfl: ;  tiotropium (SPIRIVA) 18 MCG inhalation capsule, Place 18 mcg into inhaler and inhale daily., Disp: , Rfl:  predniSONE (DELTASONE) 20 MG tablet, Take 2 tablets (40 mg total) by mouth daily with breakfast., Disp: 10 tablet, Rfl: 0;  [DISCONTINUED] Aclidinium Bromide (TUDORZA PRESSAIR) 400 MCG/ACT AEPB, Inhale 1 puff into the lungs 2 (two) times daily., Disp: 1 each, Rfl: 11  EXAM:  Filed Vitals:   11/25/13 0801  BP: 134/74  Temp: 98 F (36.7 C)    Body mass index is 27.25 kg/(m^2).  GENERAL: vitals reviewed and listed above, alert, oriented, appears well hydrated and in no acute distress  HEENT: atraumatic, conjunttiva clear, no obvious abnormalities on inspection of external nose and ears, normal appearance of ear canals and TMs, clear nasal congestion, mild post oropharyngeal erythema with PND, no tonsillar edema or exudate, no sinus TTP  NECK: no obvious masses on inspection  LUNGS: clear to auscultation bilaterally, no wheezes, rales or rhonchi, good air movement  CV: HRRR, no peripheral edema  MS: moves all extremities without noticeable abnormality  PSYCH: pleasant and cooperative, no obvious depression or anxiety  ASSESSMENT AND PLAN:  Discussed the following assessment and plan:  CHRONIC OBSTRUCTIVE PULMONARY DISEASE, MILD - Plan: predniSONE (DELTASONE) 20 MG tablet, albuterol (PROVENTIL HFA;VENTOLIN HFA) 108 (90 BASE) MCG/ACT inhaler  Upper respiratory infection  -given HPI and exam findings today, a serious infection or illness is unlikely. We discussed potential etiologies, with VURI being most  likely, and advised supportive care and monitoring. We discussed treatment side effects, likely course, antibiotic misuse, transmission, and signs of developing a serious illness. -given COPD with increased mucus production and SOB discussed options - she declined abx but does want to try short course of steroid and refill on alb -of course, we advised to return or notify a doctor immediately if symptoms worsen or persist or new concerns arise.    Patient Instructions  Chronic Obstructive Pulmonary Disease Chronic obstructive pulmonary disease (COPD) is a common lung problem. In COPD, the flow of air from the lungs is limited. The way your lungs work will probably never return to normal, but there are things you can do to improve you lungs and  make yourself feel better. HOME CARE  Take all medicines as told by your doctor.  Only take over-the-counter or prescription medicines as told by your doctor.  Avoid medicines or cough syrups that dry up your airway (such as antihistamines) and do not allow you to get rid of thick spit. You do not need to avoid them if told differently by your doctor.  If you smoke, stop. Smoking makes the problem worse.  Avoid being around things that make your breathing worse (like smoke, chemicals, and fumes).  Use oxygen therapy and therapy to help improve your lungs (pulmonary rehabilitation) if told by your doctor. If you need home oxygen therapy, ask your doctor if you should buy a tool to measure your oxygen level (oximeter).  Avoid people who have a sickness you can catch (contagious).  Avoid going outside when it is very hot, cold, or humid.  Eat healthy foods. Eat smaller meals more often. Rest before meals.  Stay active, but remember to also rest.  Make sure to get all the shots (vaccines) your doctor recommends. Ask your doctor if you need a pneumonia shot.  Learn and use tips on how to relax.  Learn and use tips on how to control your breathing  as told by your doctor. Try:  Breathing in (inhaling) through your nose for 1 second. Then, pucker your lips and breath out (exhale) through your lips for 2 seconds.  Putting one hand on your belly (abdomen). Breathe in slowly through your nose for 1 second. Your hand on your belly should move out. Pucker your lips and breathe out slowly through your lips. Your hand on your belly should move in as you breathe out.  Learn and use controlled coughing to clear thick spit from your lungs. 1. Lean your head a little forward. 2. Breathe in deeply. 3. Try to hold your breath for 3 seconds. 4. Keep your mouth slightly open while coughing 2 times. 5. Spit any thick spit out into a tissue. 6. Rest and do the steps again 1 or 2 times as needed. GET HELP IF:  You cough up more thick spit than usual.  There is a change in the color or thickness of the spit.  It is harder to breathe than usual.  Your breathing is faster than usual. GET HELP RIGHT AWAY IF:   You have shortness of breath while resting.  You have shortness of breath that stops you from:  Being able to talk.  Doing normal activities.  You chest hurts for longer than 5 minutes.  Your skin color is more blue than usual.  Your pulse oximeter shows that you have low oxygen for longer than 5 minutes. MAKE SURE YOU:   Understand these instructions.  Will watch your condition.  Will get help right away if you are not doing well or get worse. Document Released: 02/06/2008 Document Revised: 06/10/2013 Document Reviewed: 04/16/2013 [facility] Patient Information 2014 [place], [place], [place]

## 2013-11-26 ENCOUNTER — Telehealth: Payer: Self-pay | Admitting: Internal Medicine

## 2013-11-26 NOTE — Telephone Encounter (Signed)
Relevant patient education assigned to patient using Emmi. ° °

## 2013-11-30 ENCOUNTER — Ambulatory Visit (INDEPENDENT_AMBULATORY_CARE_PROVIDER_SITE_OTHER): Payer: BC Managed Care – PPO | Admitting: Internal Medicine

## 2013-11-30 ENCOUNTER — Encounter: Payer: Self-pay | Admitting: Internal Medicine

## 2013-11-30 ENCOUNTER — Telehealth: Payer: Self-pay | Admitting: Internal Medicine

## 2013-11-30 VITALS — BP 120/76 | HR 63 | Temp 97.6°F | Ht 62.0 in | Wt 150.0 lb

## 2013-11-30 DIAGNOSIS — J189 Pneumonia, unspecified organism: Secondary | ICD-10-CM

## 2013-11-30 MED ORDER — PHENYLEPHRINE-DM-GG 2.5-5-100 MG/5ML PO LIQD: 5.0000 mL | Freq: Three times a day (TID) | ORAL | Status: DC

## 2013-11-30 MED ORDER — LEVOFLOXACIN 500 MG PO TABS: 500.0000 mg | ORAL_TABLET | Freq: Every day | ORAL | Status: DC

## 2013-11-30 NOTE — Progress Notes (Signed)
Pre visit review using our clinic review tool, if applicable. No additional management support is needed unless otherwise documented below in the visit note. 

## 2013-11-30 NOTE — Patient Instructions (Signed)
The patient is instructed to continue all medications as prescribed. Schedule followup with check out clerk upon leaving the clinic  

## 2013-11-30 NOTE — Progress Notes (Signed)
Subjective:    Patient ID: Katherine Foley, female    DOB: [birth year], 63 y.o.   MRN: [id number]  HPI Dr Maudie Mercury saw for the COPD exacerbation with cough Did get prednisone without antibiotic No cough medications Worse at night with laying flat Cough with wheezing Thick green secretions Hx of COPD CAD   Review of Systems  Constitutional: Negative for activity change, appetite change and fatigue.  HENT: Positive for postnasal drip, rhinorrhea and sinus pressure. Negative for congestion and ear pain.   Eyes: Negative for redness and visual disturbance.  Respiratory: Positive for cough, shortness of breath and stridor. Negative for wheezing.   Gastrointestinal: Negative for abdominal pain and abdominal distention.  Genitourinary: Negative for dysuria, frequency and menstrual problem.  Musculoskeletal: Negative for arthralgias, joint swelling, myalgias and neck pain.  Skin: Negative for rash and wound.  Neurological: Negative for dizziness, weakness and headaches.  Hematological: Negative for adenopathy. Does not bruise/bleed easily.  Psychiatric/Behavioral: Negative for sleep disturbance and decreased concentration.   Past Medical History  Diagnosis Date  . AORTIC STENOSIS 07/07/2010    a. echo 12/23/10: EF 60-65%, mod to severe AS with mean gradient 29 mmHg;  b. 04/2011 s/p bioprosthetic AVR.  Marland Kitchen Barrett's esophagus 04/14/2007  . CAD, NATIVE VESSEL 05/17/2010    a. NSTEMI 8/11 - BMS to RCA;  b. cath 4/12: dLAD 50%, RI 80% (PCI), AVCFX 30%, pRCA 30%, stent ok, 40-50, then 50% dist RCA;  c. s/p Promus DES to RI 12/25/10;  d. 04/2011 CABG x 1 (LIMA->LAD) @ time of AVR  . Carotid stenosis 05/17/2010    a. dopplers 57/32: RICA 20-25%; LICA 4-27% (follow up due 11/12)  . 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 Herpes simplex without mention of complication 0/62/3762  . [place], MILD 08/29/2009  . OSTEOPENIA 04/14/2007  . URINARY INCONTINENCE, STRESS, MILD 08/26/2007  .  GERD (gastroesophageal reflux disease)   . Menopause   . Ovarian cyst   . Urine, incontinence, stress female   . Adenomatous colon polyp   . HYPERTENSION 04/14/2007  . Heart murmur   . Anginal pain   . Myocardial infarction 2011    "during catherization" (03/25/2013)  . Hemorrhoid thrombosis     "had it lanced" (03/25/2013)  . Pneumonia     "multiple times" (03/25/2013)  . Chronic bronchitis     "multiple times" (03/25/2013)  . Exertional shortness of breath     "related to COPD problems" (03/25/2013)  . History of blood transfusion     'w/OHS" (03/25/2013)  . H/O hiatal hernia   . MIGRAINE HEADACHE 04/20/2008  . Migraine   . ANXIETY DEPRESSION 02/15/2009    History   Social History  . Marital Status: Single    Spouse Name: N/A    Number of Children: N/A  . Years of Education: N/A   Occupational History  . Not on file.   Social History Main Topics  . Smoking status: Former Smoker -- 1.50 packs/day for 43 years    Types: Cigarettes    Quit date: 09/03/2005  . Smokeless tobacco: Never Used  . Alcohol Use: Yes     Comment: 03/25/2013 "I have drank; very rare"  . Drug Use: No  . Sexual Activity: No   Other Topics Concern  . Not on file   Social History Narrative  . No narrative on file    Past Surgical History  Procedure Laterality Date  . Tonsillectomy and adenoidectomy    . Nasal sinus  surgery  1997; 1998    "cust in maxillary sinus; put a stent in then removed it" (03/25/2013)  . Blepharoplasty Bilateral ~ 1998  . Thrombosed hemorrohid lanced    . Ovarian cyst removal Right 2011    "size of a soccer ball" (03/25/2013)  . Appendectomy    . Total abdominal hysterectomy    . Aortic valve replacement  2012  . Cardiac catheterization    . Coronary angioplasty with stent placement      "I have 2 stents" (03/25/2013)  . Cholecystectomy  03/25/2013    w/IOC (03/25/2013)  . Cholecystectomy N/A 03/25/2013    Procedure: LAPAROSCOPIC CHOLECYSTECTOMY WITH INTRAOPERATIVE  CHOLANGIOGRAM;  Surgeon: Imogene Burn. Georgette Dover, MD;  Location: [facility];  Service: General;  Laterality: N/A;    Family History  Problem Relation Age of Onset  . Coronary artery disease Other   . Heart disease Other     6 stents  . Heart disease Mother   . COPD Father   . Heart disease Father   . Heart disease Maternal Uncle     Allergies  Allergen Reactions  . Codeine Sulfate Rash    REACTION: unspecified  . Hydrocodone-Acetaminophen Rash    REACTION: unspecified  . Amoxicillin-Pot Clavulanate     REACTION: unspecified    Current Outpatient Prescriptions on File Prior to Visit  Medication Sig Dispense Refill  . albuterol (PROVENTIL HFA;VENTOLIN HFA) 108 (90 BASE) MCG/ACT inhaler Inhale 2 puffs into the lungs every 6 (six) hours as needed.  1 Inhaler  1  . aspirin 81 MG tablet Take 81 mg by mouth daily.        . Biotin 10 MG TABS Take 10 mg by mouth daily.      Marland Kitchen estradiol (VIVELLE-DOT) 0.05 MG/24HR Place 1 patch onto the skin 2 (two) times a week. Thursday and Monday      . nadolol (CORGARD) 40 MG tablet Take 1 tablet (40 mg total) by mouth 2 (two) times daily.  60 tablet  11  . omeprazole (PRILOSEC) 40 MG capsule Take 1 capsule (40 mg total) by mouth daily.  30 capsule  3  . predniSONE (DELTASONE) 20 MG tablet Take 2 tablets (40 mg total) by mouth daily with breakfast.  10 tablet  0  . rosuvastatin (CRESTOR) 40 MG tablet Take 40 mg by mouth daily.      Marland Kitchen tiotropium (SPIRIVA) 18 MCG inhalation capsule Place 18 mcg into inhaler and inhale daily.      . [DISCONTINUED] Aclidinium Bromide (TUDORZA PRESSAIR) 400 MCG/ACT AEPB Inhale 1 puff into the lungs 2 (two) times daily.  1 each  11   No current facility-administered medications on file prior to visit.    BP 120/76  Pulse 63  Temp(Src) 97.6 F (36.4 C) (Oral)  Ht 5\' 2"  (1.575 m)  Wt 150 lb (68.04 kg)  BMI 27.43 kg/m2  SpO2 97%       Objective:   Physical Exam  Nursing note and vitals reviewed. Constitutional: She is  oriented to person, place, and time. She appears well-developed and well-nourished. No distress.  HENT:  Head: Normocephalic and atraumatic.  Eyes: Conjunctivae and EOM are normal. Pupils are equal, round, and reactive to light.  Neck: Normal range of motion. Neck supple. No JVD present. No tracheal deviation present. No thyromegaly present.  Cardiovascular: Normal rate, regular rhythm, normal heart sounds and intact distal pulses.   No murmur heard. Pulmonary/Chest: Effort normal. She has wheezes. She has rales. She exhibits  no tenderness.  Abdominal: Soft. Bowel sounds are normal.  Musculoskeletal: Normal range of motion. She exhibits no edema and no tenderness.  Lymphadenopathy:    She has no cervical adenopathy.  Neurological: She is alert and oriented to person, place, and time. She has normal reflexes. No cranial nerve deficit.  Skin: Skin is warm and dry. She is not diaphoretic.          Assessment & Plan:  COPD exacerbation Needs antibiotic levofloxacin 500  For 10 days tessilon perles mucinex

## 2013-11-30 NOTE — Telephone Encounter (Signed)
Patient Information:  Caller Name: [facility]  Phone: [phone]  Patient: Katherine Foley, Katherine Foley  Gender: Female  DOB: Feb 16, 1951  Age: 63 Years  PCP: Benay Pillow (Adults only)  Office Follow Up:  Does the office need to follow up with this patient?: No  Instructions For The Office: N/A  RN Note:  Patient was seen in office and was placed on Prednisone for breathing.  States since that time she has developed a sore throat and a cough that is productive at times with brownish mucus. Seen in the office on Thursday.  Patient has wheezing however states that it is no different than normal.  Symptoms  Reason For Call & Symptoms: Cough and sore throat  Reviewed Health History In EMR: Yes  Reviewed Medications In EMR: Yes  Reviewed Allergies In EMR: Yes  Reviewed Surgeries / Procedures: Yes  Date of Onset of Symptoms: 11/29/2013  Treatments Tried: Prednisone  Treatments Tried Worked: Yes  Guideline(s) Used:  Cough  Disposition Per Guideline:   Go to Office Now  Reason For Disposition Reached:   Wheezing is present  Advice Given:  Cough Medicines:  Home Remedy - Honey: This old home remedy has been shown to help decrease coughing at night. The adult dosage is 2 teaspoons (10 ml) at bedtime. Honey should not be given to infants under one year of age.  Patient Will Follow Care Advice:  YES  Appointment Scheduled:  11/30/2013 14:30:00 Appointment Scheduled Provider:  Benay Pillow (Adults only)

## 2014-03-04 ENCOUNTER — Other Ambulatory Visit: Payer: Self-pay | Admitting: Cardiovascular Disease

## 2014-03-04 ENCOUNTER — Other Ambulatory Visit: Payer: Self-pay | Admitting: Internal Medicine

## 2014-03-30 ENCOUNTER — Encounter: Payer: Self-pay | Admitting: Cardiovascular Disease

## 2014-03-30 NOTE — Telephone Encounter (Signed)
New message     Question about gaps in care.

## 2014-03-30 NOTE — Telephone Encounter (Signed)
This encounter was created in error - please disregard.

## 2014-04-02 ENCOUNTER — Other Ambulatory Visit: Payer: Self-pay | Admitting: Internal Medicine

## 2014-04-11 ENCOUNTER — Other Ambulatory Visit: Payer: Self-pay | Admitting: Cardiovascular Disease

## 2014-04-13 ENCOUNTER — Encounter: Payer: Self-pay | Admitting: Physician Assistant

## 2014-04-13 ENCOUNTER — Ambulatory Visit (INDEPENDENT_AMBULATORY_CARE_PROVIDER_SITE_OTHER): Payer: BC Managed Care – PPO | Admitting: Physician Assistant

## 2014-04-13 VITALS — BP 110/68 | HR 71 | Temp 98.2°F | Resp 18 | Wt 153.0 lb

## 2014-04-13 DIAGNOSIS — J309 Allergic rhinitis, unspecified: Secondary | ICD-10-CM

## 2014-04-13 MED ORDER — PREDNISONE 20 MG PO TABS: ORAL_TABLET | ORAL | Status: DC

## 2014-04-13 NOTE — Progress Notes (Signed)
Subjective:    Patient ID: Katherine Foley, female    DOB: [birth year], 63 y.o.   MRN: [id number]  Sinusitis This is a new problem. The current episode started in the past 7 days (3 days). The problem has been gradually worsening since onset. There has been no fever. Associated symptoms include chills, congestion, coughing (dry), headaches, a hoarse voice, sinus pressure and a sore throat. Pertinent negatives include no diaphoresis, ear pain (but does have ear fullness), neck pain, shortness of breath, sneezing or swollen glands. Treatments tried: mucinex. The treatment provided no relief.      Review of Systems  Constitutional: Positive for chills. Negative for fever and diaphoresis.  HENT: Positive for congestion, hoarse voice, postnasal drip, sinus pressure and sore throat. Negative for ear discharge, ear pain (but does have ear fullness) and sneezing.   Respiratory: Positive for cough (dry) and wheezing. Negative for shortness of breath.   Cardiovascular: Negative for chest pain.  Gastrointestinal: Negative for nausea, vomiting and diarrhea.  Musculoskeletal: Negative for neck pain.  Neurological: Positive for headaches. Negative for syncope.  All other systems reviewed and are negative.    Past Medical History  Diagnosis Date  . AORTIC STENOSIS 07/07/2010    a. echo 12/23/10: EF 60-65%, mod to severe AS with mean gradient 29 mmHg;  b. 04/2011 s/p bioprosthetic AVR.  Marland Kitchen Barrett's esophagus 04/14/2007  . CAD, NATIVE VESSEL 05/17/2010    a. NSTEMI 8/11 - BMS to RCA;  b. cath 4/12: dLAD 50%, RI 80% (PCI), AVCFX 30%, pRCA 30%, stent ok, 40-50, then 50% dist RCA;  c. s/p Promus DES to RI 12/25/10;  d. 04/2011 CABG x 1 (LIMA->LAD) @ time of AVR  . Carotid stenosis 05/17/2010    a. dopplers 45/85: RICA 92-92%; LICA 4-46% (follow up due 11/12)  . 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 Herpes simplex without mention of complication 2/86/3817  . [place], MILD 08/29/2009  .  OSTEOPENIA 04/14/2007  . URINARY INCONTINENCE, STRESS, MILD 08/26/2007  . GERD (gastroesophageal reflux disease)   . Menopause   . Ovarian cyst   . Urine, incontinence, stress female   . Adenomatous colon polyp   . HYPERTENSION 04/14/2007  . Heart murmur   . Anginal pain   . Myocardial infarction 2011    "during catherization" (03/25/2013)  . Hemorrhoid thrombosis     "had it lanced" (03/25/2013)  . Pneumonia     "multiple times" (03/25/2013)  . Chronic bronchitis     "multiple times" (03/25/2013)  . Exertional shortness of breath     "related to COPD problems" (03/25/2013)  . History of blood transfusion     'w/OHS" (03/25/2013)  . H/O hiatal hernia   . MIGRAINE HEADACHE 04/20/2008  . Migraine   . ANXIETY DEPRESSION 02/15/2009    History   Social History  . Marital Status: Single    Spouse Name: N/A    Number of Children: N/A  . Years of Education: N/A   Occupational History  . Not on file.   Social History Main Topics  . Smoking status: Former Smoker -- 1.50 packs/day for 43 years    Types: Cigarettes    Quit date: 09/03/2005  . Smokeless tobacco: Never Used  . Alcohol Use: Yes     Comment: 03/25/2013 "I have drank; very rare"  . Drug Use: No  . Sexual Activity: No   Other Topics Concern  . Not on file   Social History Narrative  . No narrative  on file    Past Surgical History  Procedure Laterality Date  . Tonsillectomy and adenoidectomy    . Nasal sinus surgery  1997; 1998    "cust in maxillary sinus; put a stent in then removed it" (03/25/2013)  . Blepharoplasty Bilateral ~ 1998  . Thrombosed hemorrohid lanced    . Ovarian cyst removal Right 2011    "size of a soccer ball" (03/25/2013)  . Appendectomy    . Total abdominal hysterectomy    . Aortic valve replacement  2012  . Cardiac catheterization    . Coronary angioplasty with stent placement      "I have 2 stents" (03/25/2013)  . Cholecystectomy  03/25/2013    w/IOC (03/25/2013)  . Cholecystectomy N/A  03/25/2013    Procedure: LAPAROSCOPIC CHOLECYSTECTOMY WITH INTRAOPERATIVE CHOLANGIOGRAM;  Surgeon: Imogene Burn. Georgette Dover, MD;  Location: [place] [facility];  Service: General;  Laterality: N/A;    Family History  Problem Relation Age of Onset  . Coronary artery disease Other   . Heart disease Other     6 stents  . Heart disease Mother   . COPD Father   . Heart disease Father   . Heart disease Maternal Uncle     Allergies  Allergen Reactions  . Codeine Sulfate Rash    REACTION: unspecified  . Hydrocodone-Acetaminophen Rash    REACTION: unspecified  . Amoxicillin-Pot Clavulanate     REACTION: unspecified    Current Outpatient Prescriptions on File Prior to Visit  Medication Sig Dispense Refill  . albuterol (PROVENTIL HFA;VENTOLIN HFA) 108 (90 BASE) MCG/ACT inhaler Inhale 2 puffs into the lungs every 6 (six) hours as needed.  1 Inhaler  1  . aspirin 81 MG tablet Take 81 mg by mouth daily.        . Biotin 10 MG TABS Take 10 mg by mouth daily.      . CRESTOR 40 MG tablet TAKE ONE TABLET BY MOUTH ONCE DAILY.  PT NEEDS TO CALL OFFICE FOR APPOINTMENT FOR FURTHER REFILLS.  15 tablet  0  . estradiol (VIVELLE-DOT) 0.05 MG/24HR Place 1 patch onto the skin 2 (two) times a week. Thursday and Monday      . nadolol (CORGARD) 40 MG tablet Take 1 tablet (40 mg total) by mouth 2 (two) times daily.  60 tablet  11  . omeprazole (PRILOSEC) 40 MG capsule Take 1 capsule (40 mg total) by mouth daily.  30 capsule  3  . Phenylephrine-DM-GG (MUCINEX FAST-MAX CONGEST COUGH) 2.5-5-100 MG/5ML LIQD Take 5 mLs by mouth 3 (three) times daily.  180 mL  0  . SPIRIVA HANDIHALER 18 MCG inhalation capsule INHALE ONE DOSE ONCE DAILY  30 capsule  5  . VIVELLE-DOT 0.05 MG/24HR patch APPLY 1 PATCH TOPICALLY AND CHANGE TWICE PER WEEK  10 patch  1  . [DISCONTINUED] Aclidinium Bromide (TUDORZA PRESSAIR) 400 MCG/ACT AEPB Inhale 1 puff into the lungs 2 (two) times daily.  1 each  11   No current facility-administered medications on file prior  to visit.    EXAM: BP 110/68  Pulse 71  Temp(Src) 98.2 F (36.8 C) (Oral)  Resp 18  Wt 153 lb (69.4 kg)  SpO2 94%     Objective:   Physical Exam  Nursing note and vitals reviewed. Constitutional: She is oriented to person, place, and time. She appears well-developed and well-nourished. No distress.  HENT:  Head: Normocephalic and atraumatic.  Right Ear: External ear normal.  Left Ear: External ear normal.  Nose: Nose normal.  Mouth/Throat: No oropharyngeal exudate.  Oropharynx is slightly erythematous, no exudate. Bilateral TMs normal. Bilateral frontal sinuses non-TTP. Bilat Max sinuses mildly ttp.  Eyes: Conjunctivae and EOM are normal. Pupils are equal, round, and reactive to light.  Neck: Normal range of motion. Neck supple.  Cardiovascular: Normal rate, regular rhythm and intact distal pulses.   Pulmonary/Chest: Effort normal. No stridor. No respiratory distress. She has wheezes (mild.). She has no rales. She exhibits no tenderness.  Lymphadenopathy:    She has no cervical adenopathy.  Neurological: She is alert and oriented to person, place, and time.  Skin: Skin is warm and dry. She is not diaphoretic.  Psychiatric: She has a normal mood and affect. Her behavior is normal. Judgment and thought content normal.     Lab Results  Component Value Date   WBC 6.9 11/05/2013   HGB 13.5 11/05/2013   HCT 40.6 11/05/2013   PLT 185 11/05/2013   GLUCOSE 104* 11/05/2013   CHOL 137 01/30/2013   TRIG 70.0 01/30/2013   HDL 57.00 01/30/2013   LDLDIRECT 73.3 10/02/2010   LDLCALC 66 01/30/2013   ALT 34 01/30/2013   AST 32 01/30/2013   NA 135* 11/05/2013   K 4.2 11/05/2013   CL 97 11/05/2013   CREATININE 0.58 11/05/2013   BUN 10 11/05/2013   CO2 26 11/05/2013   TSH 0.75 01/30/2013   INR 1.16 05/21/2011   HGBA1C 6.2* 04/27/2011        Assessment & Plan:  Katherine Foley was seen today for sinusitis.  Diagnoses and associated orders for this visit:  Allergic rhinitis, unspecified allergic rhinitis  type Comments: With wheezing. Treat with Prednisone and add otc mucinex, nasal steroid, antihistamine, rest, push fluids. - predniSONE (DELTASONE) 20 MG tablet; 3 tablets daily for 3 days, 2 tablets daily for 3 days, one tablet daily for 3 days.    Continue to use OTC symptomatics. Cough only started today per pt, however can't use hycodan due to allergy. If cough persists, can use Robitussin-DM. If cough becomes productive, particularly of purulent sputum, pt will follow up.  Wheezing, with history of asthma, will use prednisone to treat.  Return precautions provided, and patient handout on allergic rhinitis.  Plan to follow up as needed, or for worsening or persistent symptoms despite treatment.  Patient Instructions  Prednisone taper as directed. Take with food to prevent nausea.  Plain Over the Counter Mucinex (NOT Mucinex D) for thick secretions  Force NON dairy fluids, drinking plenty of water is best.    Over the Counter Flonase OR Nasacort AQ 1 spray in each nostril twice a day as needed. Use the "crossover" technique into opposite nostril spraying toward opposite ear @ 45 degree angle, not straight up into nostril.   Plain Over the Counter Allegra (NOT D )  160 daily , OR Loratidine 10 mg , OR Zyrtec 10 mg @ bedtime  as needed for itchy eyes & sneezing.  Saline Irrigation and Saline Sprays can also help reduce symptoms.  If emergency symptoms discussed during visit developed, seek medical attention immediately.  Followup as needed, or for worsening or persistent symptoms despite treatment.

## 2014-04-13 NOTE — Patient Instructions (Addendum)
Prednisone taper as directed. Take with food to prevent nausea.  Plain Over the Counter Mucinex (NOT Mucinex D) for thick secretions  Force NON dairy fluids, drinking plenty of water is best.    Over the Counter Flonase OR Nasacort AQ 1 spray in each nostril twice a day as needed. Use the "crossover" technique into opposite nostril spraying toward opposite ear @ 45 degree angle, not straight up into nostril.   Plain Over the Counter Allegra (NOT D )  160 daily , OR Loratidine 10 mg , OR Zyrtec 10 mg @ bedtime  as needed for itchy eyes & sneezing.  Saline Irrigation and Saline Sprays can also help reduce symptoms.  If emergency symptoms discussed during visit developed, seek medical attention immediately.  Followup as needed, or for worsening or persistent symptoms despite treatment.    Allergic Rhinitis Allergic rhinitis is when the mucous membranes in the nose respond to allergens. Allergens are particles in the air that cause your body to have an allergic reaction. This causes you to release allergic antibodies. Through a chain of events, these eventually cause you to release histamine into the blood stream. Although meant to protect the body, it is this release of histamine that causes your discomfort, such as frequent sneezing, congestion, and an itchy, runny nose.  CAUSES  Seasonal allergic rhinitis (hay fever) is caused by pollen allergens that may come from grasses, trees, and weeds. Year-round allergic rhinitis (perennial allergic rhinitis) is caused by allergens such as house dust mites, pet dander, and mold spores.  SYMPTOMS   Nasal stuffiness (congestion).  Itchy, runny nose with sneezing and tearing of the eyes. DIAGNOSIS  Your health care provider can help you determine the allergen or allergens that trigger your symptoms. If you and your health care provider are unable to determine the allergen, skin or blood testing may be used. TREATMENT  Allergic rhinitis does not have  a cure, but it can be controlled by:  Medicines and allergy shots (immunotherapy).  Avoiding the allergen. Hay fever may often be treated with antihistamines in pill or nasal spray forms. Antihistamines block the effects of histamine. There are over-the-counter medicines that may help with nasal congestion and swelling around the eyes. Check with your health care provider before taking or giving this medicine.  If avoiding the allergen or the medicine prescribed do not work, there are many new medicines your health care provider can prescribe. Stronger medicine may be used if initial measures are ineffective. Desensitizing injections can be used if medicine and avoidance does not work. Desensitization is when a patient is given ongoing shots until the body becomes less sensitive to the allergen. Make sure you follow up with your health care provider if problems continue. HOME CARE INSTRUCTIONS It is not possible to completely avoid allergens, but you can reduce your symptoms by taking steps to limit your exposure to them. It helps to know exactly what you are allergic to so that you can avoid your specific triggers. SEEK MEDICAL CARE IF:   You have a fever.  You develop a cough that does not stop easily (persistent).  You have shortness of breath.  You start wheezing.  Symptoms interfere with normal daily activities. Document Released: 05/15/2001 Document Revised: 08/25/2013 Document Reviewed: 04/27/2013 [facility] Patient Information 2015 [place], [place]. This information is not intended to replace advice given to you by your health care provider. Make sure you discuss any questions you have with your health care provider.

## 2014-04-13 NOTE — Progress Notes (Signed)
Pre visit review using our clinic review tool, if applicable. No additional management support is needed unless otherwise documented below in the visit note. 

## 2014-04-16 ENCOUNTER — Telehealth: Payer: Self-pay | Admitting: Internal Medicine

## 2014-04-16 DIAGNOSIS — J0100 Acute maxillary sinusitis, unspecified: Secondary | ICD-10-CM

## 2014-04-16 MED ORDER — DOXYCYCLINE HYCLATE 100 MG PO TABS: 100.0000 mg | ORAL_TABLET | Freq: Two times a day (BID) | ORAL | Status: DC

## 2014-04-16 NOTE — Telephone Encounter (Signed)
Pt was seen on 8/11 for ?allergies. Pt is coughing now and per pt if she is no better PA said he will call abx call into [facility]

## 2014-04-16 NOTE — Telephone Encounter (Signed)
Called and spoke with pt and pt is aware.  

## 2014-04-27 ENCOUNTER — Other Ambulatory Visit: Payer: Self-pay | Admitting: Internal Medicine

## 2014-04-27 ENCOUNTER — Other Ambulatory Visit: Payer: Self-pay | Admitting: Cardiovascular Disease

## 2014-04-30 ENCOUNTER — Other Ambulatory Visit: Payer: BC Managed Care – PPO

## 2014-05-07 ENCOUNTER — Encounter: Payer: BC Managed Care – PPO | Admitting: Internal Medicine

## 2014-05-14 ENCOUNTER — Ambulatory Visit (INDEPENDENT_AMBULATORY_CARE_PROVIDER_SITE_OTHER): Payer: BC Managed Care – PPO | Admitting: Family

## 2014-05-14 ENCOUNTER — Encounter: Payer: Self-pay | Admitting: Family

## 2014-05-14 VITALS — BP 122/80 | HR 66 | Wt 149.7 lb

## 2014-05-14 DIAGNOSIS — K2100 Gastro-esophageal reflux disease with esophagitis, without bleeding: Secondary | ICD-10-CM

## 2014-05-14 DIAGNOSIS — K2270 Barrett's esophagus without dysplasia: Secondary | ICD-10-CM

## 2014-05-14 DIAGNOSIS — Z860100 Personal history of colon polyps, unspecified: Secondary | ICD-10-CM

## 2014-05-14 DIAGNOSIS — Z23 Encounter for immunization: Secondary | ICD-10-CM

## 2014-05-14 DIAGNOSIS — E7800 Pure hypercholesterolemia, unspecified: Secondary | ICD-10-CM

## 2014-05-14 DIAGNOSIS — I10 Essential (primary) hypertension: Secondary | ICD-10-CM

## 2014-05-14 DIAGNOSIS — Z1239 Encounter for other screening for malignant neoplasm of breast: Secondary | ICD-10-CM

## 2014-05-14 DIAGNOSIS — Z1231 Encounter for screening mammogram for malignant neoplasm of breast: Secondary | ICD-10-CM

## 2014-05-14 MED ORDER — ESTRADIOL 0.05 MG/24HR TD PTTW: MEDICATED_PATCH | TRANSDERMAL | Status: DC

## 2014-05-14 MED ORDER — ESOMEPRAZOLE MAGNESIUM 40 MG PO CPDR: 40.0000 mg | DELAYED_RELEASE_CAPSULE | Freq: Every day | ORAL | Status: DC

## 2014-05-14 MED ORDER — ROSUVASTATIN CALCIUM 40 MG PO TABS: ORAL_TABLET | ORAL | Status: DC

## 2014-05-14 NOTE — Progress Notes (Signed)
Subjective:    Patient ID: Katherine Foley, female    DOB: December 15, 1950, 63 y.o.   MRN: [id number]  HPI  63 year old white female, nonsmoker with a history of hypertension, hyperlipidemia, GERD, Barrett's esophagus, history of colon polyps is in today to be established. She has a history of a complete hysterectomy. Patient reports GERD has been uncontrolled with heartburn and indigestion. Currently takes Prilosec twice daily without much relief. Was previously on Nexium that worked well. Denies any increased stress or any known dietary factors.  Review of Systems  Constitutional: Negative.   HENT: Negative.   Respiratory: Negative.   Cardiovascular: Negative.   Gastrointestinal: Positive for abdominal pain. Negative for constipation, blood in stool and abdominal distention.       Epigastric pain  Endocrine: Negative.   Genitourinary: Negative.   Musculoskeletal: Negative.   Skin: Negative.   Allergic/Immunologic: Negative.   Neurological: Negative.   Hematological: Negative.   Psychiatric/Behavioral: Negative.    Past Medical History  Diagnosis Date  . AORTIC STENOSIS 07/07/2010    a. echo 12/23/10: EF 60-65%, mod to severe AS with mean gradient 29 mmHg;  b. 04/2011 s/p bioprosthetic AVR.  Marland Kitchen Barrett's esophagus 04/14/2007  . CAD, NATIVE VESSEL 05/17/2010    a. NSTEMI 8/11 - BMS to RCA;  b. cath 4/12: dLAD 50%, RI 80% (PCI), AVCFX 30%, pRCA 30%, stent ok, 40-50, then 50% dist RCA;  c. s/p Promus DES to RI 12/25/10;  d. 04/2011 CABG x 1 (LIMA->LAD) @ time of AVR  . Carotid stenosis 05/17/2010    a. dopplers 93/81: RICA 01-75%; LICA 1-02% (follow up due 11/12)  . 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 Herpes simplex without mention of complication 5/85/2778  . [place], MILD 08/29/2009  . OSTEOPENIA 04/14/2007  . URINARY INCONTINENCE, STRESS, MILD 08/26/2007  . GERD (gastroesophageal reflux disease)   . Menopause   . Ovarian cyst   . Urine, incontinence, stress  female   . Adenomatous colon polyp   . HYPERTENSION 04/14/2007  . Heart murmur   . Anginal pain   . Myocardial infarction 2011    "during catherization" (03/25/2013)  . Hemorrhoid thrombosis     "had it lanced" (03/25/2013)  . Pneumonia     "multiple times" (03/25/2013)  . Chronic bronchitis     "multiple times" (03/25/2013)  . Exertional shortness of breath     "related to COPD problems" (03/25/2013)  . History of blood transfusion     'w/OHS" (03/25/2013)  . H/O hiatal hernia   . MIGRAINE HEADACHE 04/20/2008  . Migraine   . ANXIETY DEPRESSION 02/15/2009    History   Social History  . Marital Status: Single    Spouse Name: N/A    Number of Children: N/A  . Years of Education: N/A   Occupational History  . Not on file.   Social History Main Topics  . Smoking status: Former Smoker -- 1.50 packs/day for 43 years    Types: Cigarettes    Quit date: 09/03/2005  . Smokeless tobacco: Never Used  . Alcohol Use: Yes     Comment: 03/25/2013 "I have drank; very rare"  . Drug Use: No  . Sexual Activity: No   Other Topics Concern  . Not on file   Social History Narrative  . No narrative on file    Past Surgical History  Procedure Laterality Date  . Tonsillectomy and adenoidectomy    . Nasal sinus surgery  1997; 1998    "cust  in maxillary sinus; put a stent in then removed it" (03/25/2013)  . Blepharoplasty Bilateral ~ 1998  . Thrombosed hemorrohid lanced    . Ovarian cyst removal Right 2011    "size of a soccer ball" (03/25/2013)  . Appendectomy    . Total abdominal hysterectomy    . Aortic valve replacement  2012  . Cardiac catheterization    . Coronary angioplasty with stent placement      "I have 2 stents" (03/25/2013)  . Cholecystectomy  03/25/2013    w/IOC (03/25/2013)  . Cholecystectomy N/A 03/25/2013    Procedure: LAPAROSCOPIC CHOLECYSTECTOMY WITH INTRAOPERATIVE CHOLANGIOGRAM;  Surgeon: Imogene Burn. Georgette Dover, MD;  Location: [place] [facility];  Service: General;  Laterality: N/A;     Family History  Problem Relation Age of Onset  . Coronary artery disease Other   . Heart disease Other     6 stents  . Heart disease Mother   . COPD Father   . Heart disease Father   . Heart disease Maternal Uncle     Allergies  Allergen Reactions  . Codeine Sulfate Rash    REACTION: unspecified  . Hydrocodone-Acetaminophen Rash    REACTION: unspecified  . Amoxicillin-Pot Clavulanate     REACTION: unspecified    Current Outpatient Prescriptions on File Prior to Visit  Medication Sig Dispense Refill  . albuterol (PROVENTIL HFA;VENTOLIN HFA) 108 (90 BASE) MCG/ACT inhaler Inhale 2 puffs into the lungs every 6 (six) hours as needed.  1 Inhaler  1  . aspirin 81 MG tablet Take 81 mg by mouth daily.        . Biotin 10 MG TABS Take 10 mg by mouth daily.      Marland Kitchen doxycycline (VIBRA-TABS) 100 MG tablet Take 1 tablet (100 mg total) by mouth 2 (two) times daily.  20 tablet  0  . nadolol (CORGARD) 40 MG tablet Take 1 tablet (40 mg total) by mouth 2 (two) times daily.  60 tablet  11  . Phenylephrine-DM-GG (MUCINEX FAST-MAX CONGEST COUGH) 2.5-5-100 MG/5ML LIQD Take 5 mLs by mouth 3 (three) times daily.  180 mL  0  . SPIRIVA HANDIHALER 18 MCG inhalation capsule INHALE ONE DOSE ONCE DAILY  30 capsule  5  . [DISCONTINUED] Aclidinium Bromide (TUDORZA PRESSAIR) 400 MCG/ACT AEPB Inhale 1 puff into the lungs 2 (two) times daily.  1 each  11   No current facility-administered medications on file prior to visit.    BP 122/80  Pulse 66  Wt 149 lb 11.2 oz (67.903 kg)chart     Objective:   Physical Exam  Constitutional: She is oriented to person, place, and time. She appears well-developed and well-nourished.  HENT:  Right Ear: External ear normal.  Left Ear: External ear normal.  Nose: Nose normal.  Mouth/Throat: Oropharynx is clear and moist.  Neck: Normal range of motion. Neck supple. No thyromegaly present.  Cardiovascular: Normal rate, regular rhythm and normal heart sounds.    Pulmonary/Chest: Effort normal and breath sounds normal.  Abdominal: Soft. Bowel sounds are normal. There is no tenderness.  Musculoskeletal: Normal range of motion.  Neurological: She is alert and oriented to person, place, and time.  Skin: Skin is warm and dry.  Psychiatric: She has a normal mood and affect.          Assessment & Plan:  Katherine Foley was seen today for establish care and medication refill.  Diagnoses and associated orders for this visit:  Pure hypercholesterolemia - Lipid Panel; Future - Basic Metabolic Panel; Future -  Hepatic Function Panel; Future  Gastroesophageal reflux disease with esophagitis - Basic Metabolic Panel; Future - Hepatic Function Panel; Future - CBC with Differential; Future  Barrett's esophagus - Ambulatory referral to Gastroenterology - CBC with Differential; Future  Personal history of colonic polyps - Ambulatory referral to Gastroenterology  Breast cancer screening, high risk patient - MM Digital Screening; Future  Unspecified essential hypertension - Basic Metabolic Panel; Future - Hepatic Function Panel; Future  Other Orders - estradiol (VIVELLE-DOT) 0.05 MG/24HR patch; APPLY 1 PATCH TOPICALLY AND CHANGE TWICE PER WEEK - rosuvastatin (CRESTOR) 40 MG tablet; TAKE ONE TABLET BY MOUTH ONCE DAILY - esomeprazole (NEXIUM) 40 MG capsule; Take 1 capsule (40 mg total) by mouth daily.    Schedule complete physical. Return for fasting labs in the next 2 days. Temporary supply of medication given until we figure labs.

## 2014-05-14 NOTE — Progress Notes (Signed)
Pre visit review using our clinic review tool, if applicable. No additional management support is needed unless otherwise documented below in the visit note. 

## 2014-05-14 NOTE — Patient Instructions (Signed)

## 2014-05-21 ENCOUNTER — Telehealth: Payer: Self-pay | Admitting: Internal Medicine

## 2014-05-21 NOTE — Telephone Encounter (Signed)
Spoke with patient and scheduled OV with Tye Savoy, NP on 05/26/14 at 2:30 PM.

## 2014-05-26 ENCOUNTER — Ambulatory Visit: Payer: BC Managed Care – PPO | Admitting: Nurse Practitioner

## 2014-05-28 ENCOUNTER — Telehealth: Payer: Self-pay | Admitting: Family

## 2014-05-28 NOTE — Telephone Encounter (Signed)
Pt has ordered entered for mammogram to be done at the [facility].  However, pt will has had previous mammograms done at [facility] and prefers to go back there.  Please delete order in Epic for mammogram to be done at the [facility].

## 2014-05-31 NOTE — Telephone Encounter (Signed)
Order deleted.

## 2014-06-03 ENCOUNTER — Ambulatory Visit (INDEPENDENT_AMBULATORY_CARE_PROVIDER_SITE_OTHER): Payer: BC Managed Care – PPO | Admitting: Nurse Practitioner

## 2014-06-03 ENCOUNTER — Encounter: Payer: Self-pay | Admitting: Nurse Practitioner

## 2014-06-03 ENCOUNTER — Other Ambulatory Visit (INDEPENDENT_AMBULATORY_CARE_PROVIDER_SITE_OTHER): Payer: BC Managed Care – PPO

## 2014-06-03 VITALS — BP 126/66 | HR 64 | Ht 62.25 in | Wt 150.4 lb

## 2014-06-03 DIAGNOSIS — E7800 Pure hypercholesterolemia, unspecified: Secondary | ICD-10-CM

## 2014-06-03 DIAGNOSIS — I10 Essential (primary) hypertension: Secondary | ICD-10-CM

## 2014-06-03 DIAGNOSIS — Z86010 Personal history of colonic polyps: Secondary | ICD-10-CM

## 2014-06-03 DIAGNOSIS — R1310 Dysphagia, unspecified: Secondary | ICD-10-CM

## 2014-06-03 DIAGNOSIS — K2100 Gastro-esophageal reflux disease with esophagitis, without bleeding: Secondary | ICD-10-CM

## 2014-06-03 DIAGNOSIS — K219 Gastro-esophageal reflux disease without esophagitis: Secondary | ICD-10-CM

## 2014-06-03 DIAGNOSIS — K2270 Barrett's esophagus without dysplasia: Secondary | ICD-10-CM

## 2014-06-03 LAB — HEPATIC FUNCTION PANEL
ALBUMIN: 3.9 g/dL (ref 3.5–5.2)
ALK PHOS: 50 U/L (ref 39–117)
ALT: 30 U/L (ref 0–35)
AST: 28 U/L (ref 0–37)
Bilirubin, Direct: 0.1 mg/dL (ref 0.0–0.3)
Total Bilirubin: 0.6 mg/dL (ref 0.2–1.2)
Total Protein: 6.9 g/dL (ref 6.0–8.3)

## 2014-06-03 LAB — BASIC METABOLIC PANEL
BUN: 12 mg/dL (ref 6–23)
CHLORIDE: 96 meq/L (ref 96–112)
CO2: 27 meq/L (ref 19–32)
Calcium: 8.9 mg/dL (ref 8.4–10.5)
Creatinine, Ser: 0.6 mg/dL (ref 0.4–1.2)
GFR: 100.75 mL/min (ref >60.00)
GLUCOSE: 102 mg/dL — AB (ref 70–99)
Potassium: 5.8 mEq/L — ABNORMAL HIGH (ref 3.5–5.1)
SODIUM: 131 meq/L — AB (ref 135–145)

## 2014-06-03 LAB — LIPID PANEL
CHOLESTEROL: 137 mg/dL (ref 0–200)
HDL: 49.3 mg/dL (ref >39.00)
LDL Cholesterol: 66 mg/dL (ref 0–99)
NonHDL: 87.7
Total CHOL/HDL Ratio: 3
Triglycerides: 108 mg/dL (ref 0.0–149.0)
VLDL: 21.6 mg/dL (ref 0.0–40.0)

## 2014-06-03 LAB — CBC WITH DIFFERENTIAL/PLATELET
BASOS ABS: 0 10*3/uL (ref 0.0–0.1)
Basophils Relative: 0.3 % (ref 0.0–3.0)
Eosinophils Absolute: 0 10*3/uL (ref 0.0–0.7)
Eosinophils Relative: 0.1 % (ref 0.0–5.0)
HCT: 36.5 % (ref 36.0–46.0)
Hemoglobin: 12 g/dL (ref 12.0–15.0)
LYMPHS ABS: 1.2 10*3/uL (ref 0.7–4.0)
LYMPHS PCT: 22.5 % (ref 12.0–46.0)
MCHC: 32.9 g/dL (ref 30.0–36.0)
MCV: 89.7 fl (ref 78.0–100.0)
Monocytes Absolute: 0.6 10*3/uL (ref 0.1–1.0)
Monocytes Relative: 10.7 % (ref 3.0–12.0)
Neutro Abs: 3.6 10*3/uL (ref 1.4–7.7)
Neutrophils Relative %: 66.4 % (ref 43.0–77.0)
PLATELETS: 173 10*3/uL (ref 150.0–400.0)
RBC: 4.07 Mil/uL (ref 3.87–5.11)
RDW: 14.9 % (ref 11.5–15.5)
WBC: 5.4 10*3/uL (ref 4.0–10.5)

## 2014-06-03 MED ORDER — MOVIPREP 100 G PO SOLR: 1.0000 | Freq: Once | ORAL | Status: DC

## 2014-06-03 MED ORDER — SUCRALFATE 1 G PO TABS: 1.0000 g | ORAL_TABLET | Freq: Two times a day (BID) | ORAL | Status: DC

## 2014-06-03 NOTE — Patient Instructions (Addendum)
We discussed the importance of eating slowly, taking small bites of food, chewing well and consuming adequate amounts of fluid in between bites to avoid food impaction.  You have been scheduled for an endoscopy and colonoscopy. Please follow the written instructions given to you at your visit today. Please pick up your prep at the pharmacy within the next 1-3 days. If you use inhalers (even only as needed), please bring them with you on the day of your procedure. Your physician has requested that you go to www.startemmi.com and enter the access code given to you at your visit today. This web site gives a general overview about your procedure. However, you should still follow specific instructions given to you by our office regarding your preparation for the procedure.  Please purchase Pepcid-AC over the counter and take one tablet at bedtime.  Please purchase a wedge pillow for bedtime.  We have sent the following medications to your pharmacy for you to pick up at your convenience: Carafate Tablet, please take twice daily Can dissolve tablet in water and drink it

## 2014-06-03 NOTE — Progress Notes (Signed)
HPI :   Patient is a 63 year old female known remotely to Dr. Olevia Perches. She has a history of colon polyps. Screening colonoscopy was done June 2006. A sigmoid polyp was removed, path c/w adenoma . 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 She has been on Nexium, or other PPI for years. Still has to watch diet (no fried food, limited meat and limited chocolate) and takes TUMs prn. She has chronic, intermittent upper abodminal pain which hasn't changed in any way through the years.   Patient was referred by PCP, Posey Pronto, NP for solid food dysphagia which started several years ago but has become progressively worse over the last few months. She had similar problems back in 2003. EGD at that time was negative for stricture. Weight is stable.   Past Medical History  Diagnosis Date  . AORTIC STENOSIS 07/07/2010    a. echo 12/23/10: EF 60-65%, mod to severe AS with mean gradient 29 mmHg;  b. 04/2011 s/p bioprosthetic AVR.  . CAD, NATIVE VESSEL 05/17/2010    a. NSTEMI 8/11 - BMS to RCA;  b. cath 4/12: dLAD 50%, RI 80% (PCI), AVCFX 30%, pRCA 30%, stent ok, 40-50, then 50% dist RCA;  c. s/p Promus DES to RI 12/25/10;  d. 04/2011 CABG x 1 (LIMA->LAD) @ time of AVR  . Carotid stenosis 05/17/2010    a. dopplers 83/38: RICA 25-05%; LICA 3-97% (follow up due 11/12)  . 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 Herpes simplex without mention of complication 6/73/4193  . [place], MILD 08/29/2009  . OSTEOPENIA 04/14/2007  . URINARY INCONTINENCE, STRESS, MILD 08/26/2007  . GERD (gastroesophageal reflux disease)   . Menopause   . Ovarian cyst   . Urine, incontinence, stress female   . Adenomatous colon polyp   . HYPERTENSION 04/14/2007  . Heart murmur   . Myocardial infarction 2011    "during catherization" (03/25/2013)  . Hemorrhoid thrombosis     "had it lanced" (03/25/2013)  . Pneumonia     "multiple times" (03/25/2013)  . Chronic bronchitis    "multiple times" (03/25/2013)  . Exertional shortness of breath     "related to COPD problems" (03/25/2013)  . History of blood transfusion     'w/OHS" (03/25/2013)  . H/O hiatal hernia   . MIGRAINE HEADACHE 04/20/2008  . Migraine   . ANXIETY DEPRESSION 02/15/2009  . Asthma   . Colon polyp 2004  . COPD (chronic obstructive pulmonary disease)     Family History  Problem Relation Age of Onset  . Coronary artery disease Other   . Heart disease Other     6 stents  . Heart disease Mother   . COPD Father   . Heart disease Father   . Heart disease Maternal Uncle   . Breast cancer Daughter   . Colon cancer Neg Hx   . Colon polyps Neg Hx    History  Substance Use Topics  . Smoking status: Former Smoker -- 1.50 packs/day for 43 years    Types: Cigarettes    Quit date: 09/03/2005  . Smokeless tobacco: Never Used  . Alcohol Use: Yes     Comment: 03/25/2013 "I have drank; very rare"   Current Outpatient Prescriptions  Medication Sig Dispense Refill  . albuterol (PROVENTIL HFA;VENTOLIN HFA) 108 (90 BASE) MCG/ACT inhaler Inhale 2 puffs into the lungs every 6 (six) hours as needed.  1 Inhaler  1  . aspirin 81 MG tablet  Take 81 mg by mouth daily.        . Biotin 10 MG TABS Take 10 mg by mouth daily.      Marland Kitchen doxycycline (VIBRA-TABS) 100 MG tablet Take 1 tablet (100 mg total) by mouth 2 (two) times daily.  20 tablet  0  . esomeprazole (NEXIUM) 40 MG capsule Take 1 capsule (40 mg total) by mouth daily.  30 capsule  3  . estradiol (VIVELLE-DOT) 0.05 MG/24HR patch APPLY 1 PATCH TOPICALLY AND CHANGE TWICE PER WEEK  10 patch  1  . L-Lysine 1000 MG TABS Take 1,000 mg by mouth daily.      . nadolol (CORGARD) 40 MG tablet Take 1 tablet (40 mg total) by mouth 2 (two) times daily.  60 tablet  11  . Phenylephrine-DM-GG (MUCINEX FAST-MAX CONGEST COUGH) 2.5-5-100 MG/5ML LIQD Take 5 mLs by mouth 3 (three) times daily.  180 mL  0  . PRISTIQ 100 MG 24 hr tablet       . rosuvastatin (CRESTOR) 40 MG tablet TAKE  ONE TABLET BY MOUTH ONCE DAILY  30 tablet  3  . SPIRIVA HANDIHALER 18 MCG inhalation capsule INHALE ONE DOSE ONCE DAILY  30 capsule  5  . [DISCONTINUED] Aclidinium Bromide (TUDORZA PRESSAIR) 400 MCG/ACT AEPB Inhale 1 puff into the lungs 2 (two) times daily.  1 each  11   No current facility-administered medications for this visit.   Allergies  Allergen Reactions  . Codeine Sulfate Rash    REACTION: unspecified  . Hydrocodone-Acetaminophen Rash    REACTION: unspecified  . Amoxicillin-Pot Clavulanate     REACTION: unspecified   Review of Systems: Positive for vision changes, fatigue, shortness of breath and urine leakage. All other systems reviewed and negative except where noted in HPI.   Physical Exam: BP 126/66  Pulse 64  Ht 5' 2.25" (1.581 m)  Wt 150 lb 6 oz (68.21 kg)  BMI 27.29 kg/m2 Constitutional: Pleasant,well-developed, white female in no acute distress. HEENT: Normocephalic and atraumatic. Conjunctivae are normal. No scleral icterus. Neck supple.  Cardiovascular: Normal rate, regular rhythm.  Pulmonary/chest: Effort normal and breath sounds normal. No wheezing, rales or rhonchi. Abdominal: Soft, nondistended, nontender. Bowel sounds active throughout. There are no masses palpable. No hepatomegaly. Extremities: no edema Lymphadenopathy: No cervical adenopathy noted. Neurological: Alert and oriented to person place and time. Skin: Skin is warm and dry. No rashes noted. Psychiatric: Normal mood and affect. Behavior is normal.   ASSESSMENT AND PLAN:  56. 63 year old female with intermittent solid food dysphagia, chronic but slowly getting worse. For further evaluation patient will be scheduled for EGD with probable dilation. We discussed the importance of eating slowly, taking small bites of food, chewing well and consuming adequate amounts of fluid in between bites to avoid food impaction.  2. GERD. Still with some breakthrough symptoms, especially at night. Will add  Pepcid AC at bedtime. Recommended she buy a wedge pillow to sleep on. She appeared to have Barrett's on EGD but histology was negative.   3. history of adenomatous colon polyps 2006, ovedue for surveillance exam which we will get scheduled for her. The risks, benefits, and alternatives to colonoscopy with possible biopsy and possible polypectomy were discussed with the patient and she consents to proceed.

## 2014-06-04 DIAGNOSIS — K219 Gastro-esophageal reflux disease without esophagitis: Secondary | ICD-10-CM | POA: Insufficient documentation

## 2014-06-04 NOTE — Progress Notes (Signed)
Reviewed and agree with screening colon and EGD/dil, for suspected stricture

## 2014-06-11 ENCOUNTER — Encounter: Payer: Self-pay | Admitting: Internal Medicine

## 2014-06-14 ENCOUNTER — Ambulatory Visit (INDEPENDENT_AMBULATORY_CARE_PROVIDER_SITE_OTHER): Payer: BC Managed Care – PPO | Admitting: Family

## 2014-06-14 ENCOUNTER — Encounter: Payer: Self-pay | Admitting: Family

## 2014-06-14 VITALS — BP 120/80 | HR 87 | Ht 63.0 in | Wt 152.0 lb

## 2014-06-14 DIAGNOSIS — K2100 Gastro-esophageal reflux disease with esophagitis, without bleeding: Secondary | ICD-10-CM

## 2014-06-14 DIAGNOSIS — Z23 Encounter for immunization: Secondary | ICD-10-CM

## 2014-06-14 DIAGNOSIS — E7800 Pure hypercholesterolemia, unspecified: Secondary | ICD-10-CM

## 2014-06-14 DIAGNOSIS — F329 Major depressive disorder, single episode, unspecified: Secondary | ICD-10-CM

## 2014-06-14 DIAGNOSIS — F32A Depression, unspecified: Secondary | ICD-10-CM

## 2014-06-14 DIAGNOSIS — Z0000 Encounter for general adult medical examination without abnormal findings: Secondary | ICD-10-CM

## 2014-06-14 MED ORDER — DESVENLAFAXINE SUCCINATE ER 100 MG PO TB24: 100.0000 mg | ORAL_TABLET | Freq: Every day | ORAL | Status: DC

## 2014-06-14 NOTE — Progress Notes (Signed)
Subjective:    Patient ID: Katherine Foley, female    DOB: 01/05/1951, 63 y.o.   MRN: [id number]  HPI 63 year old white female, nonsmoker is in today for CPX.  This is a routine wellness  examination for this patient . I reviewed all health maintenance protocols including mammography, colonoscopy, bone density Needed referrals were placed. Age and diagnosis  appropriate screening labs were ordered. Her immunization history was reviewed and appropriate vaccinations were ordered. Her current medications and allergies were reviewed and needed refills of her chronic medications were ordered. The plan for yearly health maintenance was discussed all orders and referrals were made as appropriate.   Review of Systems  Constitutional: Negative.   HENT: Negative.   Eyes: Negative.   Respiratory: Negative.   Cardiovascular: Negative.   Gastrointestinal: Negative.   Endocrine: Negative.   Genitourinary: Negative.   Musculoskeletal: Negative.   Skin: Negative.   Allergic/Immunologic: Negative.   Neurological: Negative.   Hematological: Negative.   Psychiatric/Behavioral: Negative.    Past Medical History  Diagnosis Date  . AORTIC STENOSIS 07/07/2010    a. echo 12/23/10: EF 60-65%, mod to severe AS with mean gradient 29 mmHg;  b. 04/2011 s/p bioprosthetic AVR.  Marland Kitchen Barrett's esophagus 04/14/2007  . CAD, NATIVE VESSEL 05/17/2010    a. NSTEMI 8/11 - BMS to RCA;  b. cath 4/12: dLAD 50%, RI 80% (PCI), AVCFX 30%, pRCA 30%, stent ok, 40-50, then 50% dist RCA;  c. s/p Promus DES to RI 12/25/10;  d. 04/2011 CABG x 1 (LIMA->LAD) @ time of AVR  . Carotid stenosis 05/17/2010    a. dopplers 19/62: RICA 22-97%; LICA 9-89% (follow up due 11/12)  . 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 Herpes simplex without mention of complication 10/15/9415  . [place], MILD 08/29/2009  . OSTEOPENIA 04/14/2007  . URINARY INCONTINENCE, STRESS, MILD 08/26/2007  . GERD (gastroesophageal reflux disease)   .  Menopause   . Ovarian cyst   . Urine, incontinence, stress female   . Adenomatous colon polyp   . HYPERTENSION 04/14/2007  . Heart murmur   . Anginal pain   . Myocardial infarction 2011    "during catherization" (03/25/2013)  . Hemorrhoid thrombosis     "had it lanced" (03/25/2013)  . Pneumonia     "multiple times" (03/25/2013)  . Chronic bronchitis     "multiple times" (03/25/2013)  . Exertional shortness of breath     "related to COPD problems" (03/25/2013)  . History of blood transfusion     'w/OHS" (03/25/2013)  . H/O hiatal hernia   . MIGRAINE HEADACHE 04/20/2008  . Migraine   . ANXIETY DEPRESSION 02/15/2009  . Asthma   . Barrett's esophagus 2000  . Colon polyp 2004  . COPD (chronic obstructive pulmonary disease)     History   Social History  . Marital Status: Single    Spouse Name: N/A    Number of Children: 3  . Years of Education: N/A   Occupational History  . [place] History Main Topics  . Smoking status: Former Smoker -- 1.50 packs/day for 43 years    Types: Cigarettes    Quit date: 09/03/2005  . Smokeless tobacco: Never Used  . Alcohol Use: Yes     Comment: 03/25/2013 "I have drank; very rare"  . Drug Use: No  . Sexual Activity: No   Other Topics Concern  . Not on file   Social History Narrative  . No narrative on  file    Past Surgical History  Procedure Laterality Date  . Tonsillectomy and adenoidectomy    . Nasal sinus surgery  1997; 1998    "cust in maxillary sinus; put a stent in then removed it" (03/25/2013)  . Blepharoplasty Bilateral ~ 1998  . Thrombosed hemorrohid lanced    . Ovarian cyst removal Right 2011    "size of a soccer ball" (03/25/2013)  . Appendectomy    . Total abdominal hysterectomy    . Aortic valve replacement  2012  . Cardiac catheterization    . Coronary angioplasty with stent placement  2010    "I have 2 stents" (03/25/2013)  . Cholecystectomy  03/25/2013    w/IOC (03/25/2013)  . Cholecystectomy N/A  03/25/2013    Procedure: LAPAROSCOPIC CHOLECYSTECTOMY WITH INTRAOPERATIVE CHOLANGIOGRAM;  Surgeon: Imogene Burn. Georgette Dover, MD;  Location: [facility];  Service: General;  Laterality: N/A;  . Appendectomy      Family History  Problem Relation Age of Onset  . Coronary artery disease Other   . Heart disease Other     6 stents  . Heart disease Mother   . COPD Father   . Heart disease Father   . Heart disease Maternal Uncle   . Breast cancer Daughter   . Colon cancer Neg Hx   . Colon polyps Neg Hx     Allergies  Allergen Reactions  . Codeine Sulfate Rash    REACTION: unspecified  . Hydrocodone-Acetaminophen Rash    REACTION: unspecified  . Amoxicillin-Pot Clavulanate     REACTION: unspecified    Current Outpatient Prescriptions on File Prior to Visit  Medication Sig Dispense Refill  . albuterol (PROVENTIL HFA;VENTOLIN HFA) 108 (90 BASE) MCG/ACT inhaler Inhale 2 puffs into the lungs every 6 (six) hours as needed.  1 Inhaler  1  . aspirin 81 MG tablet Take 81 mg by mouth daily.        . Biotin 10 MG TABS Take 10 mg by mouth daily.      Marland Kitchen doxycycline (VIBRA-TABS) 100 MG tablet Take 1 tablet (100 mg total) by mouth 2 (two) times daily.  20 tablet  0  . esomeprazole (NEXIUM) 40 MG capsule Take 1 capsule (40 mg total) by mouth daily.  30 capsule  3  . estradiol (VIVELLE-DOT) 0.05 MG/24HR patch APPLY 1 PATCH TOPICALLY AND CHANGE TWICE PER WEEK  10 patch  1  . L-Lysine 1000 MG TABS Take 1,000 mg by mouth daily.      Marland Kitchen MOVIPREP 100 G SOLR Take 1 kit (200 g total) by mouth once.  1 kit  0  . nadolol (CORGARD) 40 MG tablet Take 1 tablet (40 mg total) by mouth 2 (two) times daily.  60 tablet  11  . Phenylephrine-DM-GG (MUCINEX FAST-MAX CONGEST COUGH) 2.5-5-100 MG/5ML LIQD Take 5 mLs by mouth 3 (three) times daily.  180 mL  0  . rosuvastatin (CRESTOR) 40 MG tablet TAKE ONE TABLET BY MOUTH ONCE DAILY  30 tablet  3  . SPIRIVA HANDIHALER 18 MCG inhalation capsule INHALE ONE DOSE ONCE DAILY  30 capsule  5  .  sucralfate (CARAFATE) 1 G tablet Take 1 tablet (1 g total) by mouth 2 (two) times daily.  60 tablet  2  . [DISCONTINUED] Aclidinium Bromide (TUDORZA PRESSAIR) 400 MCG/ACT AEPB Inhale 1 puff into the lungs 2 (two) times daily.  1 each  11   No current facility-administered medications on file prior to visit.    BP 120/80  Pulse  87  Ht 5' 3"  (1.6 m)  Wt 152 lb (68.947 kg)  BMI 26.93 kg/m2chart    Objective:   Physical Exam  Constitutional: She is oriented to person, place, and time. She appears well-developed and well-nourished.  HENT:  Head: Normocephalic.  Right Ear: External ear normal.  Left Ear: External ear normal.  Nose: Nose normal.  Mouth/Throat: Oropharynx is clear and moist.  Eyes: Conjunctivae and EOM are normal. Pupils are equal, round, and reactive to light.  Neck: Normal range of motion. Neck supple. No thyromegaly present.  Cardiovascular: Normal rate, regular rhythm, normal heart sounds and intact distal pulses.   Pulmonary/Chest: Effort normal and breath sounds normal.  Abdominal: Soft. Bowel sounds are normal.  Musculoskeletal: Normal range of motion. She exhibits no edema and no tenderness.  Neurological: She is alert and oriented to person, place, and time. She has normal reflexes. She displays normal reflexes. No cranial nerve deficit. Coordination normal.  Skin: Skin is warm and dry.  Psychiatric: She has a normal mood and affect.          Assessment & Plan:  Katherine Foley was seen today for no specified reason.  Diagnoses and associated orders for this visit:  Preventative health care  Depression  Pure hypercholesterolemia  Gastroesophageal reflux disease with esophagitis    Patient wishes to schedule mammogram and bone density scan on her own. She is already scheduled for colonoscopy and endoscopy with Dr. Olevia Perches next week.

## 2014-06-14 NOTE — Patient Instructions (Signed)
Cardiac Diet This diet can help prevent heart disease and stroke. Many factors influence your heart health, including eating and exercise habits. Coronary risk rises a lot with abnormal blood fat (lipid) levels. Cardiac meal planning includes limiting unhealthy fats, increasing healthy fats, and making other small dietary changes. General guidelines are as follows:  Adjust calorie intake to reach and maintain desirable body weight.  Limit total fat intake to less than 30% of total calories. Saturated fat should be less than 7% of calories.  Saturated fats are found in animal products and in some vegetable products. Saturated vegetable fats are found in coconut oil, cocoa butter, palm oil, and palm kernel oil. Read labels carefully to avoid these products as much as possible. Use butter in moderation. Choose tub margarines and oils that have 2 grams of fat or less. Good cooking oils are canola and olive oils.  Practice low-fat cooking techniques. Do not fry food. Instead, broil, bake, boil, steam, grill, roast on a rack, stir-fry, or microwave it. Other fat reducing suggestions include:  Remove the skin from poultry.  Remove all visible fat from meats.  Skim the fat off stews, soups, and gravies before serving them.  Steam vegetables in water or broth instead of sauting them in fat.  Avoid foods with trans fat (or hydrogenated oils), such as commercially fried foods and commercially baked goods. Commercial shortening and deep-frying fats will contain trans fat.  Increase intake of fruits, vegetables, whole grains, and legumes to replace foods high in fat.  Increase consumption of nuts, legumes, and seeds to at least 4 servings weekly. One serving of a legume equals  cup, and 1 serving of nuts or seeds equals  cup.  Choose whole grains more often. Have 3 servings per day (a serving is 1 ounce [oz]).  Eat 4 to 5 servings of vegetables per day. A serving of vegetables is 1 cup of raw leafy  vegetables;  cup of raw or cooked cut-up vegetables;  cup of vegetable juice.  Eat 4 to 5 servings of fruit per day. A serving of fruit is 1 medium whole fruit;  cup of dried fruit;  cup of fresh, frozen, or canned fruit;  cup of 100% fruit juice.  Increase your intake of dietary fiber to 20 to 30 grams per day. Insoluble fiber may help lower your risk of heart disease and may help curb your appetite.  Soluble fiber binds cholesterol to be removed from the blood. Foods high in soluble fiber are dried beans, citrus fruits, oats, apples, bananas, broccoli, Brussels sprouts, and eggplant.  Try to include foods fortified with plant sterols or stanols, such as yogurt, breads, juices, or margarines. Choose several fortified foods to achieve a daily intake of 2 to 3 grams of plant sterols or stanols.  Foods with omega-3 fats can help reduce your risk of heart disease. Aim to have a 3.5 oz portion of fatty fish twice per week, such as salmon, mackerel, albacore tuna, sardines, lake trout, or herring. If you wish to take a fish oil supplement, choose one that contains 1 gram of both DHA and EPA.  Limit processed meats to 2 servings (3 oz portion) weekly.  Limit the sodium in your diet to 1500 milligrams (mg) per day. If you have high blood pressure, talk to a registered dietitian about a DASH (Dietary Approaches to Stop Hypertension) eating plan.  Limit sweets and beverages with added sugar, such as soda, to no more than 5 servings per week. One   serving is:   1 tablespoon sugar.  1 tablespoon jelly or jam.   cup sorbet.  1 cup lemonade.   cup regular soda. CHOOSING FOODS Starches  Allowed: Breads: All kinds (wheat, rye, raisin, white, oatmeal, Italian, French, and English muffin bread). Low-fat rolls: English muffins, frankfurter and hamburger buns, bagels, pita bread, tortillas (not fried). Pancakes, waffles, biscuits, and muffins made with recommended oil.  Avoid: Products made with  saturated or trans fats, oils, or whole milk products. Butter rolls, cheese breads, croissants. Commercial doughnuts, muffins, sweet rolls, biscuits, waffles, pancakes, store-bought mixes. Crackers  Allowed: Low-fat crackers and snacks: Animal, graham, rye, saltine (with recommended oil, no lard), oyster, and matzo crackers. Bread sticks, melba toast, rusks, flatbread, pretzels, and light popcorn.  Avoid: High-fat crackers: cheese crackers, butter crackers, and those made with coconut, palm oil, or trans fat (hydrogenated oils). Buttered popcorn. Cereals  Allowed: Hot or cold whole-grain cereals.  Avoid: Cereals containing coconut, hydrogenated vegetable fat, or animal fat. Potatoes / Pasta / Rice  Allowed: All kinds of potatoes, rice, and pasta (such as macaroni, spaghetti, and noodles).  Avoid: Pasta or rice prepared with cream sauce or high-fat cheese. Chow mein noodles, French fries. Vegetables  Allowed: All vegetables and vegetable juices.  Avoid: Fried vegetables. Vegetables in cream, butter, or high-fat cheese sauces. Limit coconut. Fruit in cream or custard. Protein  Allowed: Limit your intake of meat, seafood, and poultry to no more than 6 oz (cooked weight) per day. All lean, well-trimmed beef, veal, pork, and lamb. All chicken and turkey without skin. All fish and shellfish. Wild game: wild duck, rabbit, pheasant, and venison. Egg whites or low-cholesterol egg substitutes may be used as desired. Meatless dishes: recipes with dried beans, peas, lentils, and tofu (soybean curd). Seeds and nuts: all seeds and most nuts.  Avoid: Prime grade and other heavily marbled and fatty meats, such as short ribs, spare ribs, rib eye roast or steak, frankfurters, sausage, bacon, and high-fat luncheon meats, mutton. Caviar. Commercially fried fish. Domestic duck, goose, venison sausage. Organ meats: liver, gizzard, heart, chitterlings, brains, kidney, sweetbreads. Dairy  Allowed: Low-fat  cheeses: nonfat or low-fat cottage cheese (1% or 2% fat), cheeses made with part skim milk, such as mozzarella, farmers, string, or ricotta. (Cheeses should be labeled no more than 2 to 6 grams fat per oz.). Skim (or 1%) milk: liquid, powdered, or evaporated. Buttermilk made with low-fat milk. Drinks made with skim or low-fat milk or cocoa. Chocolate milk or cocoa made with skim or low-fat (1%) milk. Nonfat or low-fat yogurt.  Avoid: Whole milk cheeses, including colby, cheddar, muenster, Monterey Jack, Havarti, Brie, Camembert, American, Swiss, and blue. Creamed cottage cheese, cream cheese. Whole milk and whole milk products, including buttermilk or yogurt made from whole milk, drinks made from whole milk. Condensed milk, evaporated whole milk, and 2% milk. Soups and Combination Foods  Allowed: Low-fat low-sodium soups: broth, dehydrated soups, homemade broth, soups with the fat removed, homemade cream soups made with skim or low-fat milk. Low-fat spaghetti, lasagna, chili, and Spanish rice if low-fat ingredients and low-fat cooking techniques are used.  Avoid: Cream soups made with whole milk, cream, or high-fat cheese. All other soups. Desserts and Sweets  Allowed: Sherbet, fruit ices, gelatins, meringues, and angel food cake. Homemade desserts with recommended fats, oils, and milk products. Jam, jelly, honey, marmalade, sugars, and syrups. Pure sugar candy, such as gum drops, hard candy, jelly beans, marshmallows, mints, and small amounts of dark chocolate.  Avoid: Commercially prepared   cakes, pies, cookies, frosting, pudding, or mixes for these products. Desserts containing whole milk products, chocolate, coconut, lard, palm oil, or palm kernel oil. Ice cream or ice cream drinks. Candy that contains chocolate, coconut, butter, hydrogenated fat, or unknown ingredients. Buttered syrups. Fats and Oils  Allowed: Vegetable oils: safflower, sunflower, corn, soybean, cottonseed, sesame, canola, olive,  or peanut. Non-hydrogenated margarines. Salad dressing or mayonnaise: homemade or commercial, made with a recommended oil. Low or nonfat salad dressing or mayonnaise.  Limit added fats and oils to 6 to 8 tsp per day (includes fats used in cooking, baking, salads, and spreads on bread). Remember to count the "hidden fats" in foods.  Avoid: Solid fats and shortenings: butter, lard, salt pork, bacon drippings. Gravy containing meat fat, shortening, or suet. Cocoa butter, coconut. Coconut oil, palm oil, palm kernel oil, or hydrogenated oils: these ingredients are often used in bakery products, nondairy creamers, whipped toppings, candy, and commercially fried foods. Read labels carefully. Salad dressings made of unknown oils, sour cream, or cheese, such as blue cheese and Roquefort. Cream, all kinds: half-and-half, light, heavy, or whipping. Sour cream or cream cheese (even if "light" or low-fat). Nondairy cream substitutes: coffee creamers and sour cream substitutes made with palm, palm kernel, hydrogenated oils, or coconut oil. Beverages  Allowed: Coffee (regular or decaffeinated), tea. Diet carbonated beverages, mineral water. Alcohol: Check with your caregiver. Moderation is recommended.  Avoid: Whole milk, regular sodas, and juice drinks with added sugar. Condiments  Allowed: All seasonings and condiments. Cocoa powder. "Cream" sauces made with recommended ingredients.  Avoid: Carob powder made with hydrogenated fats. SAMPLE MENU Breakfast   cup orange juice   cup oatmeal  1 slice toast  1 tsp margarine  1 cup skim milk Lunch  Turkey sandwich with 2 oz turkey, 2 slices bread  Lettuce and tomato slices  Fresh fruit  Carrot sticks  Coffee or tea Snack  Fresh fruit or low-fat crackers Dinner  3 oz lean ground beef  1 baked potato  1 tsp margarine   cup asparagus  Lettuce salad  1 tbs non-creamy dressing   cup peach slices  1 cup skim milk Document Released:  05/29/2008 Document Revised: 02/19/2012 Document Reviewed: 10/20/2013 [employer] Patient Information 2015 [employer]. This information is not intended to replace advice given to you by your health care provider. Make sure you discuss any questions you have with your health care provider.  

## 2014-06-14 NOTE — Addendum Note (Signed)
Addended by: Santiago Bumpers on: 06/14/2014 10:35 AM   Modules accepted: Orders

## 2014-07-14 ENCOUNTER — Encounter: Payer: Self-pay | Admitting: Internal Medicine

## 2014-07-14 ENCOUNTER — Ambulatory Visit (AMBULATORY_SURGERY_CENTER): Payer: BC Managed Care – PPO | Admitting: Internal Medicine

## 2014-07-14 ENCOUNTER — Encounter: Payer: BC Managed Care – PPO | Admitting: Internal Medicine

## 2014-07-14 VITALS — BP 125/71 | HR 59 | Resp 20 | Ht 62.0 in | Wt 150.0 lb

## 2014-07-14 DIAGNOSIS — Z86010 Personal history of colonic polyps: Secondary | ICD-10-CM

## 2014-07-14 DIAGNOSIS — K2970 Gastritis, unspecified, without bleeding: Secondary | ICD-10-CM

## 2014-07-14 DIAGNOSIS — K2990 Gastroduodenitis, unspecified, without bleeding: Secondary | ICD-10-CM

## 2014-07-14 DIAGNOSIS — K2950 Unspecified chronic gastritis without bleeding: Secondary | ICD-10-CM

## 2014-07-14 DIAGNOSIS — K219 Gastro-esophageal reflux disease without esophagitis: Secondary | ICD-10-CM

## 2014-07-14 DIAGNOSIS — R1310 Dysphagia, unspecified: Secondary | ICD-10-CM

## 2014-07-14 MED ORDER — SODIUM CHLORIDE 0.9 % IV SOLN: 500.0000 mL | INTRAVENOUS | Status: DC

## 2014-07-14 NOTE — Progress Notes (Deleted)
YOU HAD AN ENDOSCOPIC PROCEDURE TODAY AT THE [facility]: Refer to the procedure report that was given to you for any specific questions about what was found during the examination.  If the procedure report does not answer your questions, please call your gastroenterologist to clarify.  If you requested that your care partner not be given the details of your procedure findings, then the procedure report has been included in a sealed envelope for you to review at your convenience later.  YOU SHOULD EXPECT: Some feelings of bloating in the abdomen. Passage of more gas than usual.  Walking can help get rid of the air that was put into your GI tract during the procedure and reduce the bloating. If you had a lower endoscopy (such as a colonoscopy or flexible sigmoidoscopy) you may notice spotting of blood in your stool or on the toilet paper. If you underwent a bowel prep for your procedure, then you may not have a normal bowel movement for a few days.  DIET: Your first meal following the procedure should be a light meal and then it is ok to progress to your normal diet.  A half-sandwich or bowl of soup is an example of a good first meal.  Heavy or fried foods are harder to digest and may make you feel nauseous or bloated.  Likewise meals heavy in dairy and vegetables can cause extra gas to form and this can also increase the bloating.  Drink plenty of fluids but you should avoid alcoholic beverages for 24 hours.  ACTIVITY: Your care partner should take you home directly after the procedure.  You should plan to take it easy, moving slowly for the rest of the day.  You can resume normal activity the day after the procedure however you should NOT DRIVE or use heavy machinery for 24 hours (because of the sedation medicines used during the test).    SYMPTOMS TO REPORT IMMEDIATELY: A gastroenterologist can be reached at any hour.  During normal business hours, 8:30 AM to 5:00 PM Monday through Friday,  call [phone].  After hours and on weekends, please call the GI answering service at [phone] who will take a message and have the physician on call contact you.    Following upper endoscopy (EGD)  Vomiting of blood or coffee ground material  New chest pain or pain under the shoulder blades  Painful or persistently difficult swallowing  New shortness of breath  Fever of 100F or higher  Black, tarry-looking stools  FOLLOW UP: If any biopsies were taken you will be contacted by phone or by letter within the next 1-3 weeks.  Call your gastroenterologist if you have not heard about the biopsies in 3 weeks.  Our staff will call the home number listed on your records the next business day following your procedure to check on you and address any questions or concerns that you may have at that time regarding the information given to you following your procedure. This is a courtesy call and so if there is no answer at the home number and we have not heard from you through the emergency physician on call, we will assume that you have returned to your regular daily activities without incident.  SIGNATURES/CONFIDENTIALITY: You and/or your care partner have signed paperwork which will be entered into your electronic medical record.  These signatures attest to the fact that that the information above on your After Visit Summary has been reviewed and is understood.  Full   responsibility of the confidentiality of this discharge information lies with you and/or your care-partner.     Handouts were given to your care partner on gastritis. You may resume your current medications today.  Except avoid aspirin and excedrin products.  Tramadol 50 mg when necessary for headache. Await biopsy results. Please call if any questions or concerns.

## 2014-07-14 NOTE — Op Note (Signed)
[facility]  [address] [place], [address]   ENDOSCOPY PROCEDURE REPORT  PATIENT: Katherine, Foley  MR#: [id number] BIRTHDATE: 1951-06-04 , 63  yrs. old GENDER: female ENDOSCOPIST: Lafayette Dragon, MD REFERRED BY:  Roxy Cedar, FNP-BC PROCEDURE DATE:  07/14/2014 PROCEDURE:  EGD w/ biopsy and Savary dilation of esophagus ASA CLASS:     Class II INDICATIONS:  dysphagia and history of gastroesophageal reflux. Last endoscopy in 2003 for dysphagia did not show any stricture. MEDICATIONS: Monitored anesthesia care and Propofol 200 mg IV TOPICAL ANESTHETIC: none  DESCRIPTION OF PROCEDURE: After the risks benefits and alternatives of the procedure were thoroughly explained, informed consent was obtained.  The LB [id number] endoscope was introduced through the mouth and advanced to the second portion of the duodenum , Without limitations.  The instrument was slowly withdrawn as the mucosa was fully examined.    Esophagus: proximal mid and distal esophageal mucosa appeared normal. There was no evidence of stricture. There was eccentric lumen distally. Endoscope traversed without difficulty. There was no hiatal hernia Stomach: there were streaks of intense erythema converging into the pylorus consistent with gastritis. Biopsies were obtained to rule out H. pylori. Pyloric outlet was normal. Retroflexion of the endoscope revealed normal fundus and cardia Duodenum: duodenal bulb and descending duodenum was normal 16 mm Savary dilators passed over the guidewire through the esophagus without resistance[          The scope was then withdrawn from the patient and the procedure completed.  COMPLICATIONS: There were no immediate complications.  ENDOSCOPIC IMPRESSION: 1.no evidence of esophageal stricture 2. Mild antral gastritis status post biopsies 3. Passage of 16 mm Savary dilator  RECOMMENDATIONS: 1.  Await biopsy results 2.  Anti-reflux regimen to be  follow 3.  Continue PPI  REPEAT EXAM: for EGD pending biopsy results.  eSigned:  Lafayette Dragon, MD 07/14/2014 4:13 PM    CC:

## 2014-07-14 NOTE — Progress Notes (Signed)
Called to room to assist during endoscopic procedure.  Patient ID and intended procedure confirmed with present staff. Received instructions for my participation in the procedure from the performing physician.  

## 2014-07-14 NOTE — Patient Instructions (Signed)
YOU HAD AN ENDOSCOPIC PROCEDURE TODAY AT THE [facility]: Refer to the procedure report that was given to you for any specific questions about what was found during the examination.  If the procedure report does not answer your questions, please call your gastroenterologist to clarify.  If you requested that your care partner not be given the details of your procedure findings, then the procedure report has been included in a sealed envelope for you to review at your convenience later.  YOU SHOULD EXPECT: Some feelings of bloating in the abdomen. Passage of more gas than usual.  Walking can help get rid of the air that was put into your GI tract during the procedure and reduce the bloating. If you had a lower endoscopy (such as a colonoscopy or flexible sigmoidoscopy) you may notice spotting of blood in your stool or on the toilet paper. If you underwent a bowel prep for your procedure, then you may not have a normal bowel movement for a few days.  DIET: Your first meal following the procedure should be a light meal and then it is ok to progress to your normal diet.  A half-sandwich or bowl of soup is an example of a good first meal.  Heavy or fried foods are harder to digest and may make you feel nauseous or bloated.  Likewise meals heavy in dairy and vegetables can cause extra gas to form and this can also increase the bloating.  Drink plenty of fluids but you should avoid alcoholic beverages for 24 hours.  ACTIVITY: Your care partner should take you home directly after the procedure.  You should plan to take it easy, moving slowly for the rest of the day.  You can resume normal activity the day after the procedure however you should NOT DRIVE or use heavy machinery for 24 hours (because of the sedation medicines used during the test).    SYMPTOMS TO REPORT IMMEDIATELY: A gastroenterologist can be reached at any hour.  During normal business hours, 8:30 AM to 5:00 PM Monday through Friday,  call [phone].  After hours and on weekends, please call the GI answering service at [phone] who will take a message and have the physician on call contact you.   Following lower endoscopy (colonoscopy or flexible sigmoidoscopy):  Excessive amounts of blood in the stool  Significant tenderness or worsening of abdominal pains  Swelling of the abdomen that is new, acute  Fever of 100F or higher  Following upper endoscopy (EGD)  Vomiting of blood or coffee ground material  New chest pain or pain under the shoulder blades  Painful or persistently difficult swallowing  New shortness of breath  Fever of 100F or higher  Black, tarry-looking stools  FOLLOW UP: If any biopsies were taken you will be contacted by phone or by letter within the next 1-3 weeks.  Call your gastroenterologist if you have not heard about the biopsies in 3 weeks.  Our staff will call the home number listed on your records the next business day following your procedure to check on you and address any questions or concerns that you may have at that time regarding the information given to you following your procedure. This is a courtesy call and so if there is no answer at the home number and we have not heard from you through the emergency physician on call, we will assume that you have returned to your regular daily activities without incident.  SIGNATURES/CONFIDENTIALITY: You and/or your care   partner have signed paperwork which will be entered into your electronic medical record.  These signatures attest to the fact that that the information above on your After Visit Summary has been reviewed and is understood.  Full responsibility of the confidentiality of this discharge information lies with you and/or your care-partner.     Handouts were given to your care partner on a high fiber diet with liberal fluid intake, gastritis and GERD. You may resume your current medications today. Please follow the  dilatation diet the rest of the day. Await biopsy results. Please call if any questions or concerns.

## 2014-07-14 NOTE — Progress Notes (Signed)
A/ox3, pleased with MAC, report to RN 

## 2014-07-14 NOTE — Op Note (Signed)
[facility]  [address] [place], [address]   COLONOSCOPY PROCEDURE REPORT  PATIENT: Katherine, Foley  MR#: [id number] BIRTHDATE: 1951/03/28 , 63  yrs. old GENDER: female ENDOSCOPIST: Lafayette Dragon, MD REFERRED OB:SJGGEZM Megan Salon, FNP-BC PROCEDURE DATE:  07/14/2014 PROCEDURE:   Colonoscopy, screening First Screening Colonoscopy - Avg.  risk and is 50 yrs.  old or older - No.  Prior Negative Screening - Now for repeat screening. N/A  History of Adenoma - Now for follow-up colonoscopy & has been > or = to 3 yrs.  Yes hx of adenoma.  Has been 3 or more years since last colonoscopy.  Polyps Removed Today? No.  Polyps Removed Today? No.  Recommend repeat exam, <10 yrs? Polyps Removed Today? No.  Recommend repeat exam, <10 yrs? No. ASA CLASS:   Class II INDICATIONS:tubular adenoma removed on last colonoscopy in June 2006. MEDICATIONS: Monitored anesthesia care and Propofol 100 mg IV  DESCRIPTION OF PROCEDURE:   After the risks benefits and alternatives of the procedure were thoroughly explained, informed consent was obtained.  The digital rectal exam revealed no abnormalities of the rectum.   The LB [id number]  endoscope was introduced through the anus and advanced to the cecum, which was identified by both the appendix and ileocecal valve. No adverse events experienced.   The quality of the prep was good, using MoviPrep  The instrument was then slowly withdrawn as the colon was fully examined.      COLON FINDINGS: A normal appearing cecum, ileocecal valve, and appendiceal orifice were identified.  The ascending, transverse, descending, sigmoid colon, and rectum appeared unremarkable. Retroflexed views revealed no abnormalities. The time to cecum=8 minutes 47 seconds.  Withdrawal time=6 minutes 09 seconds.  The scope was withdrawn and the procedure completed. COMPLICATIONS: There were no immediate complications.  ENDOSCOPIC IMPRESSION: Normal  colonoscopy  RECOMMENDATIONS: High-fiber diet Recall colonoscopy in 10 years  eSigned:  Lafayette Dragon, MD 07/14/2014 4:16 PM   cc:

## 2014-07-15 ENCOUNTER — Telehealth: Payer: Self-pay | Admitting: *Deleted

## 2014-07-15 NOTE — Telephone Encounter (Signed)
  Follow up Call-  Call back number 07/14/2014  Post procedure Call Back phone  # [phone]  Permission to leave phone message Yes     Patient questions:  Person stated that there was no Ms. Elk at this number.

## 2014-07-20 ENCOUNTER — Encounter: Payer: Self-pay | Admitting: Internal Medicine

## 2014-07-26 ENCOUNTER — Encounter: Payer: Self-pay | Admitting: Family Medicine

## 2014-07-26 ENCOUNTER — Ambulatory Visit (INDEPENDENT_AMBULATORY_CARE_PROVIDER_SITE_OTHER): Payer: BC Managed Care – PPO | Admitting: Family Medicine

## 2014-07-26 VITALS — BP 127/75 | HR 73 | Temp 98.6°F | Ht 62.0 in | Wt 150.0 lb

## 2014-07-26 DIAGNOSIS — J209 Acute bronchitis, unspecified: Secondary | ICD-10-CM

## 2014-07-26 MED ORDER — METHYLPREDNISOLONE ACETATE 80 MG/ML IJ SUSP
120.0000 mg | Freq: Once | INTRAMUSCULAR | Status: AC
  Administered 2014-07-26: 120 mg via INTRAMUSCULAR

## 2014-07-26 MED ORDER — LEVOFLOXACIN 500 MG PO TABS: 500.0000 mg | ORAL_TABLET | Freq: Every day | ORAL | Status: AC

## 2014-07-26 NOTE — Progress Notes (Signed)
Pre visit review using our clinic review tool, if applicable. No additional management support is needed unless otherwise documented below in the visit note. 

## 2014-07-26 NOTE — Progress Notes (Signed)
   Subjective:    Patient ID: Katherine Foley, female    DOB: 21-Aug-1951, 63 y.o.   MRN: [id number]  HPI Here for 3 days of chest tightness and coughing up green sputum. Drinking fluids and using her inhalers.    Review of Systems  Constitutional: Negative.   HENT: Negative.   Eyes: Negative.   Respiratory: Positive for cough, chest tightness, shortness of breath and wheezing.   Cardiovascular: Negative.        Objective:   Physical Exam  Constitutional: She appears well-developed and well-nourished.  HENT:  Right Ear: External ear normal.  Left Ear: External ear normal.  Nose: Nose normal.  Mouth/Throat: Oropharynx is clear and moist.  Eyes: Conjunctivae are normal.  Pulmonary/Chest: Effort normal. No respiratory distress. She has no wheezes. She has no rales.  Scattered rhonchi  Lymphadenopathy:    She has no cervical adenopathy.          Assessment & Plan:  Recheck prn

## 2014-08-16 ENCOUNTER — Encounter: Payer: Self-pay | Admitting: Family

## 2014-08-16 ENCOUNTER — Ambulatory Visit (INDEPENDENT_AMBULATORY_CARE_PROVIDER_SITE_OTHER): Payer: BC Managed Care – PPO | Admitting: Family

## 2014-08-16 VITALS — BP 120/82 | HR 77 | Temp 97.3°F | Ht 62.0 in | Wt 149.0 lb

## 2014-08-16 DIAGNOSIS — J209 Acute bronchitis, unspecified: Secondary | ICD-10-CM

## 2014-08-16 DIAGNOSIS — R062 Wheezing: Secondary | ICD-10-CM

## 2014-08-16 MED ORDER — ALBUTEROL SULFATE (2.5 MG/3ML) 0.083% IN NEBU: 2.5000 mg | INHALATION_SOLUTION | Freq: Once | RESPIRATORY_TRACT | Status: DC

## 2014-08-16 MED ORDER — PREDNISONE 20 MG PO TABS: ORAL_TABLET | ORAL | Status: AC

## 2014-08-16 MED ORDER — ALBUTEROL SULFATE (2.5 MG/3ML) 0.083% IN NEBU
2.5000 mg | INHALATION_SOLUTION | Freq: Once | RESPIRATORY_TRACT | Status: AC
  Administered 2014-08-16: 2.5 mg via RESPIRATORY_TRACT

## 2014-08-16 NOTE — Patient Instructions (Signed)

## 2014-08-16 NOTE — Progress Notes (Signed)
Pre visit review using our clinic review tool, if applicable. No additional management support is needed unless otherwise documented below in the visit note. 

## 2014-08-17 ENCOUNTER — Encounter: Payer: Self-pay | Admitting: Family

## 2014-08-17 NOTE — Progress Notes (Signed)
Subjective:    Patient ID: Katherine Foley, female    DOB: 1951/08/04, 63 y.o.   MRN: [id number]  HPI 63 year old white female, x 4 days is in today with c/o cough, wheezing, SOB, despite taking OTC Mucinex DM. Has a cough that is productive with yellow phlegm. Denies fever or chills. No chest pain or palpitations.    Review of Systems  Constitutional: Negative.   HENT: Negative.   Respiratory: Positive for cough, shortness of breath and wheezing.   Cardiovascular: Negative.   Gastrointestinal: Negative.   Endocrine: Negative.   Musculoskeletal: Negative.   Skin: Negative.   Allergic/Immunologic: Negative.   Neurological: Negative.   Hematological: Negative.   Psychiatric/Behavioral: Negative.   All other systems reviewed and are negative.  Past Medical History  Diagnosis Date  . AORTIC STENOSIS 07/07/2010    a. echo 12/23/10: EF 60-65%, mod to severe AS with mean gradient 29 mmHg;  b. 04/2011 s/p bioprosthetic AVR.  Marland Kitchen Barrett's esophagus 04/14/2007  . CAD, NATIVE VESSEL 05/17/2010    a. NSTEMI 8/11 - BMS to RCA;  b. cath 4/12: dLAD 50%, RI 80% (PCI), AVCFX 30%, pRCA 30%, stent ok, 40-50, then 50% dist RCA;  c. s/p Promus DES to RI 12/25/10;  d. 04/2011 CABG x 1 (LIMA->LAD) @ time of AVR  . Carotid stenosis 05/17/2010    a. dopplers 97/35: RICA 32-99%; LICA 2-42% (follow up due 11/12)  . 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 Herpes simplex without mention of complication 6/83/4196  . [place], MILD 08/29/2009  . OSTEOPENIA 04/14/2007  . URINARY INCONTINENCE, STRESS, MILD 08/26/2007  . GERD (gastroesophageal reflux disease)   . Menopause   . Ovarian cyst   . Urine, incontinence, stress female   . Adenomatous colon polyp   . HYPERTENSION 04/14/2007  . Heart murmur   . Anginal pain   . Myocardial infarction 2011    "during catherization" (03/25/2013)  . Hemorrhoid thrombosis     "had it lanced" (03/25/2013)  . Pneumonia     "multiple times" (03/25/2013)  .  Chronic bronchitis     "multiple times" (03/25/2013)  . Exertional shortness of breath     "related to COPD problems" (03/25/2013)  . History of blood transfusion     'w/OHS" (03/25/2013)  . H/O hiatal hernia   . MIGRAINE HEADACHE 04/20/2008  . Migraine   . ANXIETY DEPRESSION 02/15/2009  . Asthma   . Barrett's esophagus 2000  . Colon polyp 2004  . COPD (chronic obstructive pulmonary disease)     History   Social History  . Marital Status: Single    Spouse Name: N/A    Number of Children: 3  . Years of Education: N/A   Occupational History  . [place] History Main Topics  . Smoking status: Former Smoker -- 1.50 packs/day for 43 years    Types: Cigarettes    Quit date: 09/03/2005  . Smokeless tobacco: Never Used  . Alcohol Use: 0.0 oz/week    0 Not specified per week     Comment: 03/25/2013 "I have drank; very rare"  . Drug Use: No  . Sexual Activity: No   Other Topics Concern  . Not on file   Social History Narrative    Past Surgical History  Procedure Laterality Date  . Tonsillectomy and adenoidectomy    . Nasal sinus surgery  1997; 1998    "cust in maxillary sinus; put a stent in then removed it" (  03/25/2013)  . Blepharoplasty Bilateral ~ 1998  . Thrombosed hemorrohid lanced    . Ovarian cyst removal Right 2011    "size of a soccer ball" (03/25/2013)  . Appendectomy    . Total abdominal hysterectomy    . Aortic valve replacement  2012  . Cardiac catheterization    . Coronary angioplasty with stent placement  2010    "I have 2 stents" (03/25/2013)  . Cholecystectomy  03/25/2013    w/IOC (03/25/2013)  . Cholecystectomy N/A 03/25/2013    Procedure: LAPAROSCOPIC CHOLECYSTECTOMY WITH INTRAOPERATIVE CHOLANGIOGRAM;  Surgeon: Imogene Burn. Georgette Dover, MD;  Location: [facility];  Service: General;  Laterality: N/A;  . Appendectomy      Family History  Problem Relation Age of Onset  . Coronary artery disease Other   . Heart disease Other     6 stents  . Heart  disease Mother   . COPD Father   . Heart disease Father   . Heart disease Maternal Uncle   . Breast cancer Daughter   . Colon cancer Neg Hx   . Colon polyps Neg Hx     Allergies  Allergen Reactions  . Codeine Sulfate Rash    REACTION: unspecified  . Hydrocodone-Acetaminophen Rash    REACTION: unspecified  . Amoxicillin-Pot Clavulanate     REACTION: unspecified    Current Outpatient Prescriptions on File Prior to Visit  Medication Sig Dispense Refill  . albuterol (PROVENTIL HFA;VENTOLIN HFA) 108 (90 BASE) MCG/ACT inhaler Inhale 2 puffs into the lungs every 6 (six) hours as needed. 1 Inhaler 1  . aspirin 81 MG tablet Take 81 mg by mouth daily.      Marland Kitchen desvenlafaxine (PRISTIQ) 100 MG 24 hr tablet Take 1 tablet (100 mg total) by mouth daily. 30 tablet 5  . esomeprazole (NEXIUM) 40 MG capsule Take 1 capsule (40 mg total) by mouth daily. 30 capsule 3  . estradiol (VIVELLE-DOT) 0.05 MG/24HR patch APPLY 1 PATCH TOPICALLY AND CHANGE TWICE PER WEEK 10 patch 1  . L-Lysine 1000 MG TABS Take 1,000 mg by mouth daily.    . nadolol (CORGARD) 40 MG tablet Take 1 tablet (40 mg total) by mouth 2 (two) times daily. (Patient taking differently: Take 40 mg by mouth daily. ) 60 tablet 11  . Phenylephrine-DM-GG (MUCINEX FAST-MAX CONGEST COUGH) 2.5-5-100 MG/5ML LIQD Take 5 mLs by mouth 3 (three) times daily. 180 mL 0  . rosuvastatin (CRESTOR) 40 MG tablet TAKE ONE TABLET BY MOUTH ONCE DAILY 30 tablet 3  . SPIRIVA HANDIHALER 18 MCG inhalation capsule INHALE ONE DOSE ONCE DAILY 30 capsule 5  . sucralfate (CARAFATE) 1 G tablet Take 1 tablet (1 g total) by mouth 2 (two) times daily. 60 tablet 2  . [DISCONTINUED] Aclidinium Bromide (TUDORZA PRESSAIR) 400 MCG/ACT AEPB Inhale 1 puff into the lungs 2 (two) times daily. 1 each 11   No current facility-administered medications on file prior to visit.    BP 120/82 mmHg  Pulse 77  Temp(Src) 97.3 F (36.3 C) (Oral)  Ht 5\' 2"  (1.575 m)  Wt 149 lb (67.586 kg)  BMI  27.25 kg/m2  SpO2 93%chart    Objective:   Physical Exam  Constitutional: She is oriented to person, place, and time. She appears well-developed and well-nourished.  HENT:  Right Ear: External ear normal.  Left Ear: External ear normal.  Nose: Nose normal.  Mouth/Throat: Oropharynx is clear and moist.  Neck: Normal range of motion. Neck supple.  Cardiovascular: Normal rate, regular rhythm and  normal heart sounds.   Pulmonary/Chest: Effort normal. She has wheezes.  Abdominal: Soft.  Musculoskeletal: Normal range of motion.  Neurological: She is alert and oriented to person, place, and time.  Skin: Skin is warm and dry.  Psychiatric: She has a normal mood and affect.          Assessment & Plan:  Katherine Foley was seen today for bronchitis.  Diagnoses and associated orders for this visit:  Acute bronchitis, unspecified organism  Wheezing - albuterol (PROVENTIL) (2.5 MG/3ML) 0.083% nebulizer solution 2.5 mg; Take 3 mLs (2.5 mg total) by nebulization once. - Discontinue: albuterol (PROVENTIL) (2.5 MG/3ML) 0.083% nebulizer solution 2.5 mg; Take 3 mLs (2.5 mg total) by nebulization once.  Other Orders - predniSONE (DELTASONE) 20 MG tablet; 60mg  PO qam x 3 days, 40mg  po qam x 3 days, 20mg  qam x 3 days   Call the office with any questions or concerns. Recheck a scheduled and as needed. Drink plenty of water.

## 2014-08-20 ENCOUNTER — Telehealth: Payer: Self-pay | Admitting: Family

## 2014-08-20 MED ORDER — AZITHROMYCIN 250 MG PO TABS: ORAL_TABLET | ORAL | Status: DC

## 2014-08-20 NOTE — Telephone Encounter (Signed)
Sent antibiotic to the pharmacy . °

## 2014-08-20 NOTE — Telephone Encounter (Signed)
Please advise 

## 2014-08-20 NOTE — Telephone Encounter (Signed)
Pt states she is not better from her visit on Monday. Pt states her coughing is just as bad, pt has been taking the prednisone robitussin for the cough, just not any better at all. No appt today. pls advise.

## 2014-08-20 NOTE — Telephone Encounter (Signed)
Pt aware.

## 2014-08-23 ENCOUNTER — Telehealth: Payer: Self-pay

## 2014-08-23 MED ORDER — ESTRADIOL 0.05 MG/24HR TD PTTW: MEDICATED_PATCH | TRANSDERMAL | Status: DC

## 2014-08-23 NOTE — Telephone Encounter (Signed)
Done

## 2014-08-23 NOTE — Telephone Encounter (Signed)
Rx request for Estradiol 0.05 mg g/d biweek patch- Apply 1 patch topically and change twice per week #10 patch    Pharm:  [facility]  Pls advise.

## 2014-09-01 ENCOUNTER — Ambulatory Visit (INDEPENDENT_AMBULATORY_CARE_PROVIDER_SITE_OTHER): Payer: BC Managed Care – PPO | Admitting: Family

## 2014-09-01 ENCOUNTER — Other Ambulatory Visit: Payer: Self-pay | Admitting: Family

## 2014-09-01 ENCOUNTER — Ambulatory Visit (INDEPENDENT_AMBULATORY_CARE_PROVIDER_SITE_OTHER)
Admission: RE | Admit: 2014-09-01 | Discharge: 2014-09-01 | Disposition: A | Discharge status: Home / Self Care | Payer: BC Managed Care – PPO | Source: Ambulatory Visit | Attending: Family | Admitting: Family

## 2014-09-01 ENCOUNTER — Encounter: Payer: Self-pay | Admitting: Family

## 2014-09-01 VITALS — BP 118/74 | HR 71 | Temp 97.7°F | Wt 147.4 lb

## 2014-09-01 DIAGNOSIS — J209 Acute bronchitis, unspecified: Secondary | ICD-10-CM

## 2014-09-01 DIAGNOSIS — R059 Cough, unspecified: Secondary | ICD-10-CM

## 2014-09-01 DIAGNOSIS — R05 Cough: Secondary | ICD-10-CM

## 2014-09-01 MED ORDER — PROMETHAZINE-DM 6.25-15 MG/5ML PO SYRP: 5.0000 mL | ORAL_SOLUTION | Freq: Four times a day (QID) | ORAL | Status: DC | PRN

## 2014-09-01 MED ORDER — PREDNISONE 20 MG PO TABS: ORAL_TABLET | ORAL | Status: AC

## 2014-09-01 NOTE — Patient Instructions (Signed)

## 2014-09-01 NOTE — Progress Notes (Signed)
Pre visit review using our clinic review tool, if applicable. No additional management support is needed unless otherwise documented below in the visit note. 

## 2014-09-01 NOTE — Progress Notes (Signed)
Subjective:    Patient ID: Katherine Foley, female    DOB: December 29, 1950, 63 y.o.   MRN: [id number]  HPI 63 year old and wheezing. She was seen 2 weeks ago for acute bronchitis treated with prednisone that helped. Continues to use her Spiriva and her rescue inhaler. Her cough persists approximately a week later and she was called in a prescription for Z-Pak. Continues to cough that is productive with yellow to green phlegm. Reports wheezing. Has a history of COPD   Review of Systems  Constitutional: Positive for fatigue. Negative for fever.  HENT: Positive for congestion.   Respiratory: Positive for cough and wheezing.   Cardiovascular: Negative.   Gastrointestinal: Negative.   Endocrine: Negative.   Genitourinary: Negative.   Musculoskeletal: Negative.   Skin: Negative.   Allergic/Immunologic: Negative.   Psychiatric/Behavioral: Negative.   All other systems reviewed and are negative.  Past Medical History  Diagnosis Date  . AORTIC STENOSIS 07/07/2010    a. echo 12/23/10: EF 60-65%, mod to severe AS with mean gradient 29 mmHg;  b. 04/2011 s/p bioprosthetic AVR.  Marland Kitchen Barrett's esophagus 04/14/2007  . CAD, NATIVE VESSEL 05/17/2010    a. NSTEMI 8/11 - BMS to RCA;  b. cath 4/12: dLAD 50%, RI 80% (PCI), AVCFX 30%, pRCA 30%, stent ok, 40-50, then 50% dist RCA;  c. s/p Promus DES to RI 12/25/10;  d. 04/2011 CABG x 1 (LIMA->LAD) @ time of AVR  . Carotid stenosis 05/17/2010    a. dopplers 97/67: RICA 34-19%; LICA 3-79% (follow up due 11/12)  . 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 Herpes simplex without mention of complication 0/24/0973  . [place], MILD 08/29/2009  . OSTEOPENIA 04/14/2007  . URINARY INCONTINENCE, STRESS, MILD 08/26/2007  . GERD (gastroesophageal reflux disease)   . Menopause   . Ovarian cyst   . Urine, incontinence, stress female   . Adenomatous colon polyp   . HYPERTENSION 04/14/2007  . Heart murmur   . Anginal pain   . Myocardial infarction 2011   "during catherization" (03/25/2013)  . Hemorrhoid thrombosis     "had it lanced" (03/25/2013)  . Pneumonia     "multiple times" (03/25/2013)  . Chronic bronchitis     "multiple times" (03/25/2013)  . Exertional shortness of breath     "related to COPD problems" (03/25/2013)  . History of blood transfusion     'w/OHS" (03/25/2013)  . H/O hiatal hernia   . MIGRAINE HEADACHE 04/20/2008  . Migraine   . ANXIETY DEPRESSION 02/15/2009  . Asthma   . Barrett's esophagus 2000  . Colon polyp 2004  . COPD (chronic obstructive pulmonary disease)     History   Social History  . Marital Status: Single    Spouse Name: N/A    Number of Children: 3  . Years of Education: N/A   Occupational History  . [employer] History Main Topics  . Smoking status: Former Smoker -- 1.50 packs/day for 43 years    Types: Cigarettes    Quit date: 09/03/2005  . Smokeless tobacco: Never Used  . Alcohol Use: 0.0 oz/week    0 Not specified per week     Comment: 03/25/2013 "I have drank; very rare"  . Drug Use: No  . Sexual Activity: No   Other Topics Concern  . Not on file   Social History Narrative    Past Surgical History  Procedure Laterality Date  . Tonsillectomy and adenoidectomy    . Nasal sinus surgery  1997; 1998    "cust in maxillary sinus; put a stent in then removed it" (03/25/2013)  . Blepharoplasty Bilateral ~ 1998  . Thrombosed hemorrohid lanced    . Ovarian cyst removal Right 2011    "size of a soccer ball" (03/25/2013)  . Appendectomy    . Total abdominal hysterectomy    . Aortic valve replacement  2012  . Cardiac catheterization    . Coronary angioplasty with stent placement  2010    "I have 2 stents" (03/25/2013)  . Cholecystectomy  03/25/2013    w/IOC (03/25/2013)  . Cholecystectomy N/A 03/25/2013    Procedure: LAPAROSCOPIC CHOLECYSTECTOMY WITH INTRAOPERATIVE CHOLANGIOGRAM;  Surgeon: Imogene Burn. Georgette Dover, MD;  Location: [facility];  Service: General;  Laterality: N/A;  .  Appendectomy      Family History  Problem Relation Age of Onset  . Coronary artery disease Other   . Heart disease Other     6 stents  . Heart disease Mother   . COPD Father   . Heart disease Father   . Heart disease Maternal Uncle   . Breast cancer Daughter   . Colon cancer Neg Hx   . Colon polyps Neg Hx     Allergies  Allergen Reactions  . Codeine Sulfate Rash    REACTION: unspecified  . Hydrocodone-Acetaminophen Rash    REACTION: unspecified  . Amoxicillin-Pot Clavulanate     REACTION: unspecified    Current Outpatient Prescriptions on File Prior to Visit  Medication Sig Dispense Refill  . albuterol (PROVENTIL HFA;VENTOLIN HFA) 108 (90 BASE) MCG/ACT inhaler Inhale 2 puffs into the lungs every 6 (six) hours as needed. 1 Inhaler 1  . aspirin 81 MG tablet Take 81 mg by mouth daily.      Marland Kitchen desvenlafaxine (PRISTIQ) 100 MG 24 hr tablet Take 1 tablet (100 mg total) by mouth daily. 30 tablet 5  . esomeprazole (NEXIUM) 40 MG capsule Take 1 capsule (40 mg total) by mouth daily. 30 capsule 3  . estradiol (VIVELLE-DOT) 0.05 MG/24HR patch APPLY 1 PATCH TOPICALLY AND CHANGE TWICE PER WEEK 10 patch 3  . L-Lysine 1000 MG TABS Take 1,000 mg by mouth daily.    . nadolol (CORGARD) 40 MG tablet Take 1 tablet (40 mg total) by mouth 2 (two) times daily. (Patient taking differently: Take 40 mg by mouth daily. ) 60 tablet 11  . Phenylephrine-DM-GG (MUCINEX FAST-MAX CONGEST COUGH) 2.5-5-100 MG/5ML LIQD Take 5 mLs by mouth 3 (three) times daily. 180 mL 0  . rosuvastatin (CRESTOR) 40 MG tablet TAKE ONE TABLET BY MOUTH ONCE DAILY 30 tablet 3  . SPIRIVA HANDIHALER 18 MCG inhalation capsule INHALE ONE DOSE ONCE DAILY 30 capsule 5  . sucralfate (CARAFATE) 1 G tablet Take 1 tablet (1 g total) by mouth 2 (two) times daily. 60 tablet 2  . [DISCONTINUED] Aclidinium Bromide (TUDORZA PRESSAIR) 400 MCG/ACT AEPB Inhale 1 puff into the lungs 2 (two) times daily. 1 each 11   No current facility-administered  medications on file prior to visit.    BP 118/74 mmHg  Pulse 71  Temp(Src) 97.7 F (36.5 C) (Oral)  Wt 147 lb 6.4 oz (66.86 kg)chart     Objective:   Physical Exam  Constitutional: She is oriented to person, place, and time. She appears well-developed and well-nourished.  HENT:  Right Ear: External ear normal.  Left Ear: External ear normal.  Nose: Nose normal.  Mouth/Throat: Oropharynx is clear and moist.  Neck: Normal range of motion. Neck supple.  Cardiovascular: Normal rate, regular rhythm and normal heart sounds.   Pulmonary/Chest: Effort normal. She has wheezes.  Musculoskeletal: Normal range of motion.  Neurological: She is alert and oriented to person, place, and time.  Skin: Skin is warm and dry.  Psychiatric: She has a normal mood and affect.          Assessment & Plan:   Past Medical History  Diagnosis Date  . AORTIC STENOSIS 07/07/2010    a. echo 12/23/10: EF 60-65%, mod to severe AS with mean gradient 29 mmHg;  b. 04/2011 s/p bioprosthetic AVR.  Marland Kitchen Barrett's esophagus 04/14/2007  . CAD, NATIVE VESSEL 05/17/2010    a. NSTEMI 8/11 - BMS to RCA;  b. cath 4/12: dLAD 50%, RI 80% (PCI), AVCFX 30%, pRCA 30%, stent ok, 40-50, then 50% dist RCA;  c. s/p Promus DES to RI 12/25/10;  d. 04/2011 CABG x 1 (LIMA->LAD) @ time of AVR  . Carotid stenosis 05/17/2010    a. dopplers 81/01: RICA 75-10%; LICA 2-58% (follow up due 11/12)  . 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 Herpes simplex without mention of complication 01/28/7823  . [place], MILD 08/29/2009  . OSTEOPENIA 04/14/2007  . URINARY INCONTINENCE, STRESS, MILD 08/26/2007  . GERD (gastroesophageal reflux disease)   . Menopause   . Ovarian cyst   . Urine, incontinence, stress female   . Adenomatous colon polyp   . HYPERTENSION 04/14/2007  . Heart murmur   . Anginal pain   . Myocardial infarction 2011    "during catherization" (03/25/2013)  . Hemorrhoid thrombosis     "had it lanced" (03/25/2013)   . Pneumonia     "multiple times" (03/25/2013)  . Chronic bronchitis     "multiple times" (03/25/2013)  . Exertional shortness of breath     "related to COPD problems" (03/25/2013)  . History of blood transfusion     'w/OHS" (03/25/2013)  . H/O hiatal hernia   . MIGRAINE HEADACHE 04/20/2008  . Migraine   . ANXIETY DEPRESSION 02/15/2009  . Asthma   . Barrett's esophagus 2000  . Colon polyp 2004  . COPD (chronic obstructive pulmonary disease)     History   Social History  . Marital Status: Single    Spouse Name: N/A    Number of Children: 3  . Years of Education: N/A   Occupational History  . [employer] History Main Topics  . Smoking status: Former Smoker -- 1.50 packs/day for 43 years    Types: Cigarettes    Quit date: 09/03/2005  . Smokeless tobacco: Never Used  . Alcohol Use: 0.0 oz/week    0 Not specified per week     Comment: 03/25/2013 "I have drank; very rare"  . Drug Use: No  . Sexual Activity: No   Other Topics Concern  . Not on file   Social History Narrative    Past Surgical History  Procedure Laterality Date  . Tonsillectomy and adenoidectomy    . Nasal sinus surgery  1997; 1998    "cust in maxillary sinus; put a stent in then removed it" (03/25/2013)  . Blepharoplasty Bilateral ~ 1998  . Thrombosed hemorrohid lanced    . Ovarian cyst removal Right 2011    "size of a soccer ball" (03/25/2013)  . Appendectomy    . Total abdominal hysterectomy    . Aortic valve replacement  2012  . Cardiac catheterization    . Coronary angioplasty with stent placement  2010    "I  have 2 stents" (03/25/2013)  . Cholecystectomy  03/25/2013    w/IOC (03/25/2013)  . Cholecystectomy N/A 03/25/2013    Procedure: LAPAROSCOPIC CHOLECYSTECTOMY WITH INTRAOPERATIVE CHOLANGIOGRAM;  Surgeon: Imogene Burn. Georgette Dover, MD;  Location: [facility];  Service: General;  Laterality: N/A;  . Appendectomy      Family History  Problem Relation Age of Onset  . Coronary artery disease Other     . Heart disease Other     6 stents  . Heart disease Mother   . COPD Father   . Heart disease Father   . Heart disease Maternal Uncle   . Breast cancer Daughter   . Colon cancer Neg Hx   . Colon polyps Neg Hx     Allergies  Allergen Reactions  . Codeine Sulfate Rash    REACTION: unspecified  . Hydrocodone-Acetaminophen Rash    REACTION: unspecified  . Amoxicillin-Pot Clavulanate     REACTION: unspecified    Current Outpatient Prescriptions on File Prior to Visit  Medication Sig Dispense Refill  . albuterol (PROVENTIL HFA;VENTOLIN HFA) 108 (90 BASE) MCG/ACT inhaler Inhale 2 puffs into the lungs every 6 (six) hours as needed. 1 Inhaler 1  . aspirin 81 MG tablet Take 81 mg by mouth daily.      Marland Kitchen desvenlafaxine (PRISTIQ) 100 MG 24 hr tablet Take 1 tablet (100 mg total) by mouth daily. 30 tablet 5  . esomeprazole (NEXIUM) 40 MG capsule Take 1 capsule (40 mg total) by mouth daily. 30 capsule 3  . estradiol (VIVELLE-DOT) 0.05 MG/24HR patch APPLY 1 PATCH TOPICALLY AND CHANGE TWICE PER WEEK 10 patch 3  . L-Lysine 1000 MG TABS Take 1,000 mg by mouth daily.    . nadolol (CORGARD) 40 MG tablet Take 1 tablet (40 mg total) by mouth 2 (two) times daily. (Patient taking differently: Take 40 mg by mouth daily. ) 60 tablet 11  . Phenylephrine-DM-GG (MUCINEX FAST-MAX CONGEST COUGH) 2.5-5-100 MG/5ML LIQD Take 5 mLs by mouth 3 (three) times daily. 180 mL 0  . rosuvastatin (CRESTOR) 40 MG tablet TAKE ONE TABLET BY MOUTH ONCE DAILY 30 tablet 3  . SPIRIVA HANDIHALER 18 MCG inhalation capsule INHALE ONE DOSE ONCE DAILY 30 capsule 5  . sucralfate (CARAFATE) 1 G tablet Take 1 tablet (1 g total) by mouth 2 (two) times daily. 60 tablet 2  . [DISCONTINUED] Aclidinium Bromide (TUDORZA PRESSAIR) 400 MCG/ACT AEPB Inhale 1 puff into the lungs 2 (two) times daily. 1 each 11   No current facility-administered medications on file prior to visit.    BP 118/74 mmHg  Pulse 71  Temp(Src) 97.7 F (36.5 C) (Oral)   Wt 147 lb 6.4 oz (66.86 kg)chart Chest x-ray to evaluate the lungs and discuss further treatment plan. Phenergan DM 1 teaspoon every 6 hours as needed for cough.

## 2014-09-13 ENCOUNTER — Encounter: Payer: Self-pay | Admitting: Family Medicine

## 2014-09-13 ENCOUNTER — Ambulatory Visit (INDEPENDENT_AMBULATORY_CARE_PROVIDER_SITE_OTHER): Payer: BLUE CROSS/BLUE SHIELD | Admitting: Family Medicine

## 2014-09-13 VITALS — BP 130/80 | HR 60 | Temp 97.6°F | Wt 149.0 lb

## 2014-09-13 DIAGNOSIS — J0100 Acute maxillary sinusitis, unspecified: Secondary | ICD-10-CM

## 2014-09-13 DIAGNOSIS — R053 Chronic cough: Secondary | ICD-10-CM

## 2014-09-13 DIAGNOSIS — R05 Cough: Secondary | ICD-10-CM

## 2014-09-13 MED ORDER — METHYLPREDNISOLONE ACETATE 80 MG/ML IJ SUSP
80.0000 mg | Freq: Once | INTRAMUSCULAR | Status: AC
  Administered 2014-09-13: 80 mg via INTRAMUSCULAR

## 2014-09-13 MED ORDER — LEVOFLOXACIN 500 MG PO TABS: 500.0000 mg | ORAL_TABLET | Freq: Every day | ORAL | Status: DC

## 2014-09-13 NOTE — Progress Notes (Signed)
Subjective:    Patient ID: Katherine Foley, female    DOB: 1950/10/17, 64 y.o.   MRN: [id number]  HPI  patient has underlying COPD and quit smoking about 10 years ago. She is seen for acute visit with cough off and on since November. She had recent chest x-ray which showed no acute findings. She has cough productive of yellow sputum. History of Zithromax couple weeks go without improvement. She's been on prednisone taper with temporary improvement. She takes Spiriva once daily. Previously took Symbicort but did not feel this this was helpful. She's had some intermittent headaches and sinus pressure especially maxillary sinuses. Increased malaise. Occasional sore throat. Uses rescue inhaler  occasionally   no active GERD symptoms. No ACE inhibitor use. No postnasal drip symptoms.  Past Medical History  Diagnosis Date  . AORTIC STENOSIS 07/07/2010    a. echo 12/23/10: EF 60-65%, mod to severe AS with mean gradient 29 mmHg;  b. 04/2011 s/p bioprosthetic AVR.  Marland Kitchen Barrett's esophagus 04/14/2007  . CAD, NATIVE VESSEL 05/17/2010    a. NSTEMI 8/11 - BMS to RCA;  b. cath 4/12: dLAD 50%, RI 80% (PCI), AVCFX 30%, pRCA 30%, stent ok, 40-50, then 50% dist RCA;  c. s/p Promus DES to RI 12/25/10;  d. 04/2011 CABG x 1 (LIMA->LAD) @ time of AVR  . Carotid stenosis 05/17/2010    a. dopplers 12/87: RICA 86-76%; LICA 7-20% (follow up due 11/12)  . 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 Herpes simplex without mention of complication 9/47/0962  . [place], MILD 08/29/2009  . OSTEOPENIA 04/14/2007  . URINARY INCONTINENCE, STRESS, MILD 08/26/2007  . GERD (gastroesophageal reflux disease)   . Menopause   . Ovarian cyst   . Urine, incontinence, stress female   . Adenomatous colon polyp   . HYPERTENSION 04/14/2007  . Heart murmur   . Anginal pain   . Myocardial infarction 2011    "during catherization" (03/25/2013)  . Hemorrhoid thrombosis     "had it lanced" (03/25/2013)  . Pneumonia    "multiple times" (03/25/2013)  . Chronic bronchitis     "multiple times" (03/25/2013)  . Exertional shortness of breath     "related to COPD problems" (03/25/2013)  . History of blood transfusion     'w/OHS" (03/25/2013)  . H/O hiatal hernia   . MIGRAINE HEADACHE 04/20/2008  . Migraine   . ANXIETY DEPRESSION 02/15/2009  . Asthma   . Barrett's esophagus 2000  . Colon polyp 2004  . COPD (chronic obstructive pulmonary disease)    Past Surgical History  Procedure Laterality Date  . Tonsillectomy and adenoidectomy    . Nasal sinus surgery  1997; 1998    "cust in maxillary sinus; put a stent in then removed it" (03/25/2013)  . Blepharoplasty Bilateral ~ 1998  . Thrombosed hemorrohid lanced    . Ovarian cyst removal Right 2011    "size of a soccer ball" (03/25/2013)  . Appendectomy    . Total abdominal hysterectomy    . Aortic valve replacement  2012  . Cardiac catheterization    . Coronary angioplasty with stent placement  2010    "I have 2 stents" (03/25/2013)  . Cholecystectomy  03/25/2013    w/IOC (03/25/2013)  . Cholecystectomy N/A 03/25/2013    Procedure: LAPAROSCOPIC CHOLECYSTECTOMY WITH INTRAOPERATIVE CHOLANGIOGRAM;  Surgeon: Imogene Burn. Georgette Dover, MD;  Location: [facility];  Service: General;  Laterality: N/A;  . Appendectomy      reports that she quit smoking about 9  years ago. Her smoking use included Cigarettes. She has a 64.5 pack-year smoking history. She has never used smokeless tobacco. She reports that she drinks alcohol. She reports that she does not use illicit drugs. family history includes Breast cancer in her daughter; COPD in her father; Coronary artery disease in her other; Heart disease in her father, maternal uncle, mother, and other. There is no history of Colon cancer or Colon polyps. Allergies  Allergen Reactions  . Codeine Sulfate Rash    REACTION: unspecified  . Hydrocodone-Acetaminophen Rash    REACTION: unspecified  . Amoxicillin-Pot Clavulanate     REACTION:  unspecified      Review of Systems  Constitutional: Negative for fever and chills.  HENT: Positive for congestion and sinus pressure.   Respiratory: Positive for cough. Negative for shortness of breath.   Cardiovascular: Negative for chest pain.  Neurological: Positive for headaches.       Objective:   Physical Exam  Constitutional: She appears well-developed and well-nourished. No distress.  HENT:  Right Ear: External ear normal.  Left Ear: External ear normal.  Nose: Nose normal.  Mouth/Throat: Oropharynx is clear and moist.  Neck: Neck supple.  Cardiovascular: Normal rate and regular rhythm.   Pulmonary/Chest: Effort normal. She has wheezes. She has no rales.          Assessment & Plan:   Persistent productive cough. No active GERD symptoms. No active postnasal drip symptoms. No ACE inhibitor use. Question chronic sinusitis. Levaquin 500 mg once daily for 10 days. Continue Mucinex. Depo-Medrol 80 mg IM. Consider pulmonary referral if not improving over the next couple of weeks

## 2014-09-13 NOTE — Patient Instructions (Signed)
Please be in touch if cough not improving over the next couple of weeks.

## 2014-09-13 NOTE — Progress Notes (Signed)
Pre visit review using our clinic review tool, if applicable. No additional management support is needed unless otherwise documented below in the visit note. 

## 2014-09-15 ENCOUNTER — Other Ambulatory Visit: Payer: Self-pay | Admitting: Family

## 2014-09-17 ENCOUNTER — Telehealth: Payer: Self-pay | Admitting: Family

## 2014-09-17 NOTE — Telephone Encounter (Signed)
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 Pt states this should have been good for 6 months.  But employer is stating it is not good for 6 months, only until 09/15/14. (one month)  Would like you to resened fixing #7 & #8 (per [facility]) on fmla form.

## 2014-09-17 NOTE — Telephone Encounter (Signed)
Form printed and reviewed by [place]. Re-faxed

## 2014-10-04 ENCOUNTER — Other Ambulatory Visit: Payer: Self-pay

## 2014-10-04 MED ORDER — TIOTROPIUM BROMIDE MONOHYDRATE 18 MCG IN CAPS: ORAL_CAPSULE | RESPIRATORY_TRACT | Status: DC

## 2014-10-04 NOTE — Telephone Encounter (Signed)
Rx request for Spiriva handr- Inhale one dose into lungs once daily #30  Pharm:  [facility]  Pls advise.

## 2014-11-22 ENCOUNTER — Encounter: Payer: Self-pay | Admitting: Family Medicine

## 2014-11-22 ENCOUNTER — Ambulatory Visit (INDEPENDENT_AMBULATORY_CARE_PROVIDER_SITE_OTHER): Payer: BLUE CROSS/BLUE SHIELD | Admitting: Family Medicine

## 2014-11-22 VITALS — BP 135/79 | HR 67 | Temp 98.2°F | Ht 62.0 in | Wt 150.0 lb

## 2014-11-22 DIAGNOSIS — J439 Emphysema, unspecified: Secondary | ICD-10-CM

## 2014-11-22 DIAGNOSIS — J0100 Acute maxillary sinusitis, unspecified: Secondary | ICD-10-CM

## 2014-11-22 MED ORDER — LEVOFLOXACIN 500 MG PO TABS: 500.0000 mg | ORAL_TABLET | Freq: Every day | ORAL | Status: AC

## 2014-11-22 MED ORDER — BUDESONIDE-FORMOTEROL FUMARATE 160-4.5 MCG/ACT IN AERO: 2.0000 | INHALATION_SPRAY | Freq: Two times a day (BID) | RESPIRATORY_TRACT | Status: DC

## 2014-11-22 NOTE — Progress Notes (Signed)
Pre visit review using our clinic review tool, if applicable. No additional management support is needed unless otherwise documented below in the visit note. 

## 2014-11-23 NOTE — Progress Notes (Signed)
   Subjective:    Patient ID: Katherine Foley, female    DOB: Sep 10, 1950, 64 y.o.   MRN: [id number]  HPI Here for one week of sinus pressure, PND, ST, and coughing up yellow sputum. No fever. Also she has had more trouble with wheezing and SOB. She has been diagnosed with COPD and she uses Spiriva daily. Lately she has been using her albuterol inhaler several times a day as well.   Review of Systems  Constitutional: Negative.   HENT: Positive for congestion, postnasal drip and sinus pressure.   Eyes: Negative.   Respiratory: Positive for cough, chest tightness, shortness of breath and wheezing.   Cardiovascular: Negative.        Objective:   Physical Exam  Constitutional: She appears well-developed and well-nourished.  HENT:  Right Ear: External ear normal.  Left Ear: External ear normal.  Nose: Nose normal.  Mouth/Throat: Oropharynx is clear and moist.  Eyes: Conjunctivae are normal.  Cardiovascular: Normal rate, regular rhythm, normal heart sounds and intact distal pulses.   Pulmonary/Chest: Effort normal. She has no rales.  Scattered wheezes and rhonchi   Lymphadenopathy:    She has no cervical adenopathy.          Assessment & Plan:  Treat the sinusitis with Levaquin and Mucinex. Add Symbicort bid for the COPD.

## 2014-11-24 ENCOUNTER — Other Ambulatory Visit: Payer: Self-pay | Admitting: Family

## 2014-12-09 ENCOUNTER — Ambulatory Visit (INDEPENDENT_AMBULATORY_CARE_PROVIDER_SITE_OTHER)
Admission: RE | Admit: 2014-12-09 | Discharge: 2014-12-09 | Disposition: A | Discharge status: Home / Self Care | Payer: BLUE CROSS/BLUE SHIELD | Source: Ambulatory Visit | Attending: Family | Admitting: Family

## 2014-12-09 ENCOUNTER — Ambulatory Visit (INDEPENDENT_AMBULATORY_CARE_PROVIDER_SITE_OTHER): Payer: BLUE CROSS/BLUE SHIELD | Admitting: Family

## 2014-12-09 ENCOUNTER — Encounter: Payer: Self-pay | Admitting: Family

## 2014-12-09 ENCOUNTER — Ambulatory Visit: Payer: BC Managed Care – PPO | Admitting: Family

## 2014-12-09 VITALS — BP 138/76 | HR 74 | Temp 97.7°F | Ht 62.0 in | Wt 147.9 lb

## 2014-12-09 DIAGNOSIS — R0602 Shortness of breath: Secondary | ICD-10-CM | POA: Diagnosis not present

## 2014-12-09 LAB — BASIC METABOLIC PANEL
BUN: 10 mg/dL (ref 6–23)
CALCIUM: 9.3 mg/dL (ref 8.4–10.5)
CO2: 31 mEq/L (ref 19–32)
CREATININE: 0.65 mg/dL (ref 0.40–1.20)
Chloride: 98 mEq/L (ref 96–112)
GFR: 97.03 mL/min (ref >60.00)
GLUCOSE: 105 mg/dL — AB (ref 70–99)
Potassium: 5.2 mEq/L — ABNORMAL HIGH (ref 3.5–5.1)
Sodium: 134 mEq/L — ABNORMAL LOW (ref 135–145)

## 2014-12-09 LAB — HEPATIC FUNCTION PANEL
ALT: 32 U/L (ref 0–35)
AST: 30 U/L (ref 0–37)
Albumin: 4.1 g/dL (ref 3.5–5.2)
Alkaline Phosphatase: 49 U/L (ref 39–117)
Bilirubin, Direct: 0.1 mg/dL (ref 0.0–0.3)
Total Bilirubin: 0.6 mg/dL (ref 0.2–1.2)
Total Protein: 6.9 g/dL (ref 6.0–8.3)

## 2014-12-09 LAB — LIPID PANEL
Cholesterol: 157 mg/dL (ref 0–200)
HDL: 65 mg/dL (ref >39.00)
LDL Cholesterol: 77 mg/dL (ref 0–99)
NonHDL: 92
TRIGLYCERIDES: 74 mg/dL (ref 0.0–149.0)
Total CHOL/HDL Ratio: 2
VLDL: 14.8 mg/dL (ref 0.0–40.0)

## 2014-12-09 MED ORDER — SERTRALINE HCL 50 MG PO TABS: 50.0000 mg | ORAL_TABLET | Freq: Every day | ORAL | Status: DC

## 2014-12-09 MED ORDER — MONTELUKAST SODIUM 10 MG PO TABS: 10.0000 mg | ORAL_TABLET | Freq: Every day | ORAL | Status: DC

## 2014-12-09 NOTE — Progress Notes (Signed)
Pre visit review using our clinic review tool, if applicable. No additional management support is needed unless otherwise documented below in the visit note. 

## 2014-12-09 NOTE — Patient Instructions (Signed)

## 2014-12-09 NOTE — Progress Notes (Signed)
Subjective:    Patient ID: Katherine Foley, female    DOB: October 30, 1950, 64 y.o.   MRN: [id number]  HPI  64 year old white female, nonsmoker scratch that former smoker, is in today for recheck of COPD, hypertension, depression and anxiety, and GERD. Reports being more depressed recently is concerned that her breathing is getting worse. She saw Dr. Sarajane Jews who prescribed Symbicort. This has helped, however, her symptoms persist her father died of emphysema that she's become afraid. Reports feeling down and worried. Denies any thoughts of death or dying. Currently takes Pristiq 100 mg once daily for anxiety and depression.   Review of Systems  Constitutional: Negative.   HENT: Negative.   Respiratory: Positive for shortness of breath. Negative for wheezing and stridor.   Cardiovascular: Negative.   Endocrine: Negative.   Genitourinary: Negative.   Musculoskeletal: Negative.   Skin: Negative.   Allergic/Immunologic: Negative.   Psychiatric/Behavioral: Negative.    Past Medical History  Diagnosis Date  . AORTIC STENOSIS 07/07/2010    a. echo 12/23/10: EF 60-65%, mod to severe AS with mean gradient 29 mmHg;  b. 04/2011 s/p bioprosthetic AVR.  Marland Kitchen Barrett's esophagus 04/14/2007  . CAD, NATIVE VESSEL 05/17/2010    a. NSTEMI 8/11 - BMS to RCA;  b. cath 4/12: dLAD 50%, RI 80% (PCI), AVCFX 30%, pRCA 30%, stent ok, 40-50, then 50% dist RCA;  c. s/p Promus DES to RI 12/25/10;  d. 04/2011 CABG x 1 (LIMA->LAD) @ time of AVR  . Carotid stenosis 05/17/2010    a. dopplers 93/79: RICA 02-40%; LICA 9-73% (follow up due 11/12)  . 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 Herpes simplex without mention of complication 5/32/9924  . [place], MILD 08/29/2009  . OSTEOPENIA 04/14/2007  . URINARY INCONTINENCE, STRESS, MILD 08/26/2007  . GERD (gastroesophageal reflux disease)   . Menopause   . Ovarian cyst   . Urine, incontinence, stress female   . Adenomatous colon polyp   . HYPERTENSION 04/14/2007    . Heart murmur   . Anginal pain   . Myocardial infarction 2011    "during catherization" (03/25/2013)  . Hemorrhoid thrombosis     "had it lanced" (03/25/2013)  . Pneumonia     "multiple times" (03/25/2013)  . Chronic bronchitis     "multiple times" (03/25/2013)  . Exertional shortness of breath     "related to COPD problems" (03/25/2013)  . History of blood transfusion     'w/OHS" (03/25/2013)  . H/O hiatal hernia   . MIGRAINE HEADACHE 04/20/2008  . Migraine   . ANXIETY DEPRESSION 02/15/2009  . Asthma   . Barrett's esophagus 2000  . Colon polyp 2004  . COPD (chronic obstructive pulmonary disease)     History   Social History  . Marital Status: Single    Spouse Name: N/A  . Number of Children: 3  . Years of Education: N/A   Occupational History  . [employer] History Main Topics  . Smoking status: Former Smoker -- 1.50 packs/day for 43 years    Types: Cigarettes    Quit date: 09/03/2005  . Smokeless tobacco: Never Used  . Alcohol Use: 0.0 oz/week    0 Standard drinks or equivalent per week     Comment: 03/25/2013 "I have drank; very rare"  . Drug Use: No  . Sexual Activity: No   Other Topics Concern  . Not on file   Social History Narrative    Past Surgical History  Procedure Laterality  Date  . Tonsillectomy and adenoidectomy    . Nasal sinus surgery  1997; 1998    "cust in maxillary sinus; put a stent in then removed it" (03/25/2013)  . Blepharoplasty Bilateral ~ 1998  . Thrombosed hemorrohid lanced    . Ovarian cyst removal Right 2011    "size of a soccer ball" (03/25/2013)  . Appendectomy    . Total abdominal hysterectomy    . Aortic valve replacement  2012  . Cardiac catheterization    . Coronary angioplasty with stent placement  2010    "I have 2 stents" (03/25/2013)  . Cholecystectomy  03/25/2013    w/IOC (03/25/2013)  . Cholecystectomy N/A 03/25/2013    Procedure: LAPAROSCOPIC CHOLECYSTECTOMY WITH INTRAOPERATIVE CHOLANGIOGRAM;  Surgeon:  Imogene Burn. Georgette Dover, MD;  Location: [facility];  Service: General;  Laterality: N/A;  . Appendectomy      Family History  Problem Relation Age of Onset  . Coronary artery disease Other   . Heart disease Other     6 stents  . Heart disease Mother   . COPD Father   . Heart disease Father   . Heart disease Maternal Uncle   . Breast cancer Daughter   . Colon cancer Neg Hx   . Colon polyps Neg Hx     Allergies  Allergen Reactions  . Codeine Sulfate Rash    REACTION: unspecified  . Hydrocodone-Acetaminophen Rash    REACTION: unspecified  . Amoxicillin-Pot Clavulanate     REACTION: unspecified    Current Outpatient Prescriptions on File Prior to Visit  Medication Sig Dispense Refill  . albuterol (PROVENTIL HFA;VENTOLIN HFA) 108 (90 BASE) MCG/ACT inhaler Inhale 2 puffs into the lungs every 6 (six) hours as needed. 1 Inhaler 1  . aspirin 81 MG tablet Take 81 mg by mouth daily.      . budesonide-formoterol (SYMBICORT) 160-4.5 MCG/ACT inhaler Inhale 2 puffs into the lungs 2 (two) times daily. 1 Inhaler 5  . CRESTOR 40 MG tablet TAKE ONE TABLET BY MOUTH ONCE DAILY 30 tablet 3  . desvenlafaxine (PRISTIQ) 100 MG 24 hr tablet Take 1 tablet (100 mg total) by mouth daily. 30 tablet 5  . esomeprazole (NEXIUM) 40 MG capsule TAKE ONE CAPSULE BY MOUTH ONCE DAILY 90 capsule 0  . estradiol (VIVELLE-DOT) 0.05 MG/24HR patch APPLY 1 PATCH TOPICALLY AND CHANGE TWICE PER WEEK 10 patch 3  . nadolol (CORGARD) 40 MG tablet Take 1 tablet (40 mg total) by mouth 2 (two) times daily. (Patient taking differently: Take 40 mg by mouth daily. ) 60 tablet 11  . tiotropium (SPIRIVA HANDIHALER) 18 MCG inhalation capsule INHALE ONE DOSE ONCE DAILY 30 capsule 5  . [DISCONTINUED] Aclidinium Bromide (TUDORZA PRESSAIR) 400 MCG/ACT AEPB Inhale 1 puff into the lungs 2 (two) times daily. 1 each 11   No current facility-administered medications on file prior to visit.    BP 138/76 mmHg  Pulse 74  Temp(Src) 97.7 F (36.5 C)  (Oral)  Ht 5\' 2"  (1.575 m)  Wt 147 lb 14.4 oz (67.087 kg)  BMI 27.04 kg/m2chart    Objective:   Physical Exam  Constitutional: She is oriented to person, place, and time. She appears well-developed and well-nourished.  HENT:  Right Ear: External ear normal.  Left Ear: External ear normal.  Nose: Nose normal.  Mouth/Throat: Oropharynx is clear and moist.  Neck: Normal range of motion. Neck supple.  Cardiovascular: Normal rate, regular rhythm and normal heart sounds.   Pulmonary/Chest: Effort normal. She has no  wheezes.  Abdominal: Soft. Bowel sounds are normal.  Musculoskeletal: Normal range of motion.  Neurological: She is alert and oriented to person, place, and time.  Skin: Skin is warm and dry.  Psychiatric: She has a normal mood and affect.          Assessment & Plan:  Katherine Foley was seen today for follow-up.  Diagnoses and all orders for this visit:  Shortness of breath Orders: -     DG Chest 2 View; Future -     Basic Metabolic Panel -     Hepatic Function Panel -     Lipid Panel  Other orders -     montelukast (SINGULAIR) 10 MG tablet; Take 1 tablet (10 mg total) by mouth at bedtime. -     sertraline (ZOLOFT) 50 MG tablet; Take 1 tablet (50 mg total) by mouth daily.   Start Zoloft 50 mg in combination with Pristiq 100 mg once daily. CXR and referral to pulmonology. Recheck in 3-4 weeks.

## 2014-12-13 ENCOUNTER — Telehealth: Payer: Self-pay | Admitting: Family

## 2014-12-13 MED ORDER — NADOLOL 40 MG PO TABS: 40.0000 mg | ORAL_TABLET | Freq: Every day | ORAL | Status: DC

## 2014-12-13 NOTE — Telephone Encounter (Signed)
Done

## 2014-12-13 NOTE — Telephone Encounter (Signed)
Pt request refill nadolol (CORGARD) 40 MG tablet  Pt is out and bp is running high,.  [facility]

## 2014-12-20 ENCOUNTER — Other Ambulatory Visit: Payer: Self-pay | Admitting: Family

## 2015-01-06 ENCOUNTER — Ambulatory Visit (INDEPENDENT_AMBULATORY_CARE_PROVIDER_SITE_OTHER): Payer: BLUE CROSS/BLUE SHIELD | Admitting: Family

## 2015-01-06 ENCOUNTER — Encounter: Payer: Self-pay | Admitting: Family

## 2015-01-06 VITALS — BP 130/80 | HR 81 | Ht 62.0 in | Wt 149.6 lb

## 2015-01-06 DIAGNOSIS — F411 Generalized anxiety disorder: Secondary | ICD-10-CM

## 2015-01-06 DIAGNOSIS — F329 Major depressive disorder, single episode, unspecified: Secondary | ICD-10-CM

## 2015-01-06 DIAGNOSIS — R0602 Shortness of breath: Secondary | ICD-10-CM

## 2015-01-06 DIAGNOSIS — F32A Depression, unspecified: Secondary | ICD-10-CM

## 2015-01-06 NOTE — Progress Notes (Signed)
Subjective:    Patient ID: Lenward Chancellor, female    DOB: 1950/12/20, 64 y.o.   MRN: [id number]  HPI  64 year old white female with a history of hyperlipidemia, hypertension, anxiety, depression, reports doing well. At her last office visit we added Zoloft to her Pristiq that has been working well. She also reported shortness of breath we started Singulair which is also been working well. With still like to see pulmonology. Continues to have increased stress but is managing well. No feelings of helplessness, hopelessness, thoughts of death or dying.  Review of Systems  Constitutional: Negative.   Respiratory: Negative.   Cardiovascular: Negative.   Endocrine: Negative.   Genitourinary: Negative.   Musculoskeletal: Negative.   Skin: Negative.   Allergic/Immunologic: Negative.   Psychiatric/Behavioral: Negative.    Past Medical History  Diagnosis Date  . AORTIC STENOSIS 07/07/2010    a. echo 12/23/10: EF 60-65%, mod to severe AS with mean gradient 29 mmHg;  b. 04/2011 s/p bioprosthetic AVR.  Marland Kitchen Barrett's esophagus 04/14/2007  . CAD, NATIVE VESSEL 05/17/2010    a. NSTEMI 8/11 - BMS to RCA;  b. cath 4/12: dLAD 50%, RI 80% (PCI), AVCFX 30%, pRCA 30%, stent ok, 40-50, then 50% dist RCA;  c. s/p Promus DES to RI 12/25/10;  d. 04/2011 CABG x 1 (LIMA->LAD) @ time of AVR  . Carotid stenosis 05/17/2010    a. dopplers 44/31: RICA 54-00%; LICA 8-67% (follow up due 11/12)  . 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 Herpes simplex without mention of complication 02/20/5092  . [place], MILD 08/29/2009  . OSTEOPENIA 04/14/2007  . URINARY INCONTINENCE, STRESS, MILD 08/26/2007  . GERD (gastroesophageal reflux disease)   . Menopause   . Ovarian cyst   . Urine, incontinence, stress female   . Adenomatous colon polyp   . HYPERTENSION 04/14/2007  . Heart murmur   . Anginal pain   . Myocardial infarction 2011    "during catherization" (03/25/2013)  . Hemorrhoid thrombosis     "had it  lanced" (03/25/2013)  . Pneumonia     "multiple times" (03/25/2013)  . Chronic bronchitis     "multiple times" (03/25/2013)  . Exertional shortness of breath     "related to COPD problems" (03/25/2013)  . History of blood transfusion     'w/OHS" (03/25/2013)  . H/O hiatal hernia   . MIGRAINE HEADACHE 04/20/2008  . Migraine   . ANXIETY DEPRESSION 02/15/2009  . Asthma   . Barrett's esophagus 2000  . Colon polyp 2004  . COPD (chronic obstructive pulmonary disease)     History   Social History  . Marital Status: Single    Spouse Name: N/A  . Number of Children: 3  . Years of Education: N/A   Occupational History  . [employer] History Main Topics  . Smoking status: Former Smoker -- 1.50 packs/day for 43 years    Types: Cigarettes    Quit date: 09/03/2005  . Smokeless tobacco: Never Used  . Alcohol Use: 0.0 oz/week    0 Standard drinks or equivalent per week     Comment: 03/25/2013 "I have drank; very rare"  . Drug Use: No  . Sexual Activity: No   Other Topics Concern  . Not on file   Social History Narrative    Past Surgical History  Procedure Laterality Date  . Tonsillectomy and adenoidectomy    . Nasal sinus surgery  1997; 1998    "cust in maxillary sinus;  put a stent in then removed it" (03/25/2013)  . Blepharoplasty Bilateral ~ 1998  . Thrombosed hemorrohid lanced    . Ovarian cyst removal Right 2011    "size of a soccer ball" (03/25/2013)  . Appendectomy    . Total abdominal hysterectomy    . Aortic valve replacement  2012  . Cardiac catheterization    . Coronary angioplasty with stent placement  2010    "I have 2 stents" (03/25/2013)  . Cholecystectomy  03/25/2013    w/IOC (03/25/2013)  . Cholecystectomy N/A 03/25/2013    Procedure: LAPAROSCOPIC CHOLECYSTECTOMY WITH INTRAOPERATIVE CHOLANGIOGRAM;  Surgeon: Imogene Burn. Georgette Dover, MD;  Location: [facility];  Service: General;  Laterality: N/A;  . Appendectomy      Family History  Problem Relation Age of Onset   . Coronary artery disease Other   . Heart disease Other     6 stents  . Heart disease Mother   . COPD Father   . Heart disease Father   . Heart disease Maternal Uncle   . Breast cancer Daughter   . Colon cancer Neg Hx   . Colon polyps Neg Hx     Allergies  Allergen Reactions  . Codeine Sulfate Rash    REACTION: unspecified  . Hydrocodone-Acetaminophen Rash    REACTION: unspecified  . Amoxicillin-Pot Clavulanate     REACTION: unspecified    Current Outpatient Prescriptions on File Prior to Visit  Medication Sig Dispense Refill  . albuterol (PROVENTIL HFA;VENTOLIN HFA) 108 (90 BASE) MCG/ACT inhaler Inhale 2 puffs into the lungs every 6 (six) hours as needed. 1 Inhaler 1  . aspirin 81 MG tablet Take 81 mg by mouth daily.      . budesonide-formoterol (SYMBICORT) 160-4.5 MCG/ACT inhaler Inhale 2 puffs into the lungs 2 (two) times daily. 1 Inhaler 5  . CRESTOR 40 MG tablet TAKE ONE TABLET BY MOUTH ONCE DAILY 30 tablet 3  . desvenlafaxine (PRISTIQ) 100 MG 24 hr tablet Take 1 tablet (100 mg total) by mouth daily. 30 tablet 5  . esomeprazole (NEXIUM) 40 MG capsule TAKE ONE CAPSULE BY MOUTH ONCE DAILY 90 capsule 3  . estradiol (VIVELLE-DOT) 0.05 MG/24HR patch APPLY 1 PATCH TOPICALLY AND CHANGE TWICE PER WEEK 10 patch 3  . montelukast (SINGULAIR) 10 MG tablet Take 1 tablet (10 mg total) by mouth at bedtime. 30 tablet 3  . nadolol (CORGARD) 40 MG tablet Take 1 tablet (40 mg total) by mouth daily. 90 tablet 3  . sertraline (ZOLOFT) 50 MG tablet Take 1 tablet (50 mg total) by mouth daily. 30 tablet 3  . tiotropium (SPIRIVA HANDIHALER) 18 MCG inhalation capsule INHALE ONE DOSE ONCE DAILY 30 capsule 5  . [DISCONTINUED] Aclidinium Bromide (TUDORZA PRESSAIR) 400 MCG/ACT AEPB Inhale 1 puff into the lungs 2 (two) times daily. 1 each 11   No current facility-administered medications on file prior to visit.    BP 130/80 mmHg  Pulse 81  Ht 5\' 2"  (1.575 m)  Wt 149 lb 9.6 oz (67.858 kg)  BMI  27.36 kg/m2chart    Objective:   Physical Exam  Constitutional: She is oriented to person, place, and time. She appears well-developed and well-nourished.  Neck: Normal range of motion. Neck supple.  Cardiovascular: Normal rate, regular rhythm and normal heart sounds.   Pulmonary/Chest: Effort normal and breath sounds normal.  Abdominal: Soft. Bowel sounds are normal.  Musculoskeletal: Normal range of motion.  Neurological: She is alert and oriented to person, place, and time.  Skin: Skin  is warm and dry.  Psychiatric: She has a normal mood and affect.          Assessment & Plan:  Icess was seen today for follow-up.  Diagnoses and all orders for this visit:  Depression  Generalized anxiety disorder  Shortness of breath Orders: -     Ambulatory referral to Pulmonology   Continue current medications. Recheck in 6 months. Referral to pulmonology done today. Recheck here sooner as needed.

## 2015-01-06 NOTE — Patient Instructions (Signed)
Stress and Stress Management Stress is a normal reaction to life events. It is what you feel when life demands more than you are used to or more than you can handle. Some stress can be useful. For example, the stress reaction can help you catch the last bus of the day, study for a test, or meet a deadline at work. But stress that occurs too often or for too long can cause problems. It can affect your emotional health and interfere with relationships and normal daily activities. Too much stress can weaken your immune system and increase your risk for physical illness. If you already have a medical problem, stress can make it worse. CAUSES  All sorts of life events may cause stress. An event that causes stress for one person may not be stressful for another person. Major life events commonly cause stress. These may be positive or negative. Examples include losing your job, moving into a new home, getting married, having a baby, or losing a loved one. Less obvious life events may also cause stress, especially if they occur day after day or in combination. Examples include working long hours, driving in traffic, caring for children, being in debt, or being in a difficult relationship. SIGNS AND SYMPTOMS Stress may cause emotional symptoms including, the following:  Anxiety. This is feeling worried, afraid, on edge, overwhelmed, or out of control.  Anger. This is feeling irritated or impatient.  Depression. This is feeling sad, down, helpless, or guilty.  Difficulty focusing, remembering, or making decisions. Stress may cause physical symptoms, including the following:   Aches and pains. These may affect your head, neck, back, stomach, or other areas of your body.  Tight muscles or clenched jaw.  Low energy or trouble sleeping. Stress may cause unhealthy behaviors, including the following:   Eating to feel better (overeating) or skipping meals.  Sleeping too little, too much, or both.  Working  too much or putting off tasks (procrastination).  Smoking, drinking alcohol, or using drugs to feel better. DIAGNOSIS  Stress is diagnosed through an assessment by your health care provider. Your health care provider will ask questions about your symptoms and any stressful life events.Your health care provider will also ask about your medical history and may order blood tests or other tests. Certain medical conditions and medicine can cause physical symptoms similar to stress. Mental illness can cause emotional symptoms and unhealthy behaviors similar to stress. Your health care provider may refer you to a mental health professional for further evaluation.  TREATMENT  Stress management is the recommended treatment for stress.The goals of stress management are reducing stressful life events and coping with stress in healthy ways.  Techniques for reducing stressful life events include the following:  Stress identification. Self-monitor for stress and identify what causes stress for you. These skills may help you to avoid some stressful events.  Time management. Set your priorities, keep a calendar of events, and learn to say "no." These tools can help you avoid making too many commitments. Techniques for coping with stress include the following:  Rethinking the problem. Try to think realistically about stressful events rather than ignoring them or overreacting. Try to find the positives in a stressful situation rather than focusing on the negatives.  Exercise. Physical exercise can release both physical and emotional tension. The key is to find a form of exercise you enjoy and do it regularly.  Relaxation techniques. These relax the body and mind. Examples include yoga, meditation, tai chi, biofeedback, deep  breathing, progressive muscle relaxation, listening to music, being out in nature, journaling, and other hobbies. Again, the key is to find one or more that you enjoy and can do  regularly.  Healthy lifestyle. Eat a balanced diet, get plenty of sleep, and do not smoke. Avoid using alcohol or drugs to relax.  Strong support network. Spend time with family, friends, or other people you enjoy being around.Express your feelings and talk things over with someone you trust. Counseling or talktherapy with a mental health professional may be helpful if you are having difficulty managing stress on your own. Medicine is typically not recommended for the treatment of stress.Talk to your health care provider if you think you need medicine for symptoms of stress. HOME CARE INSTRUCTIONS  Keep all follow-up visits as directed by your health care provider.  Take all medicines as directed by your health care provider. SEEK MEDICAL CARE IF:  Your symptoms get worse or you start having new symptoms.  You feel overwhelmed by your problems and can no longer manage them on your own. SEEK IMMEDIATE MEDICAL CARE IF:  You feel like hurting yourself or someone else. Document Released: 02/13/2001 Document Revised: 01/04/2014 Document Reviewed: 04/14/2013 [facility] Patient Information 2015 [place], [place]. This information is not intended to replace advice given to you by your health care provider. Make sure you discuss any questions you have with your health care provider.

## 2015-01-06 NOTE — Progress Notes (Signed)
Pre visit review using our clinic review tool, if applicable. No additional management support is needed unless otherwise documented below in the visit note. 

## 2015-01-07 ENCOUNTER — Other Ambulatory Visit: Payer: Self-pay | Admitting: Family

## 2015-01-19 ENCOUNTER — Telehealth: Payer: Self-pay

## 2015-01-19 NOTE — Telephone Encounter (Signed)
Left message for pt to call back concerning need for mammogram or date of last mammogram.  

## 2015-01-20 ENCOUNTER — Other Ambulatory Visit: Payer: Self-pay

## 2015-01-20 DIAGNOSIS — Z1239 Encounter for other screening for malignant neoplasm of breast: Secondary | ICD-10-CM

## 2015-01-20 NOTE — Telephone Encounter (Addendum)
Pt has not had a mammogram since 2006. And it was at [facility]. pt states she will call and make an appt.

## 2015-01-20 NOTE — Telephone Encounter (Signed)
Per Katherine Foley, she will put the order in for pt to have mammogram. Pt aware.

## 2015-01-25 ENCOUNTER — Encounter: Payer: Self-pay | Admitting: Internal Medicine

## 2015-01-25 ENCOUNTER — Ambulatory Visit (INDEPENDENT_AMBULATORY_CARE_PROVIDER_SITE_OTHER): Payer: BLUE CROSS/BLUE SHIELD | Admitting: Internal Medicine

## 2015-01-25 VITALS — BP 124/66 | HR 58 | Ht 62.5 in | Wt 144.0 lb

## 2015-01-25 DIAGNOSIS — I10 Essential (primary) hypertension: Secondary | ICD-10-CM

## 2015-01-25 DIAGNOSIS — R0600 Dyspnea, unspecified: Secondary | ICD-10-CM | POA: Diagnosis not present

## 2015-01-25 MED ORDER — BISOPROLOL FUMARATE 5 MG PO TABS: 5.0000 mg | ORAL_TABLET | Freq: Every day | ORAL | Status: DC

## 2015-01-25 NOTE — Patient Instructions (Addendum)
Stop corgard and start bisoprolol 5 mg daily - if too strong break in half, if too weak double to take one twice daily   If your insurance won't pay for it we need a substitute beta blocker / blood pressure pill   Please schedule a follow up office visit in 6 weeks, call sooner if needed with pfts on return

## 2015-01-25 NOTE — Assessment & Plan Note (Signed)
Unexplained sob attributed to copd in a pt with no airflow obst by pfts 5 years after quit smoking Strongly prefer in this setting: Bystolic, the most beta -1  selective Beta blocker available in sample form, with bisoprolol the most selective generic choice  on the market at least until she returns to regroup with baseline pfts   Try bisoprolol 5 mg daily , ok to double if not strong enough

## 2015-01-25 NOTE — Assessment & Plan Note (Signed)
-   spirometry 10/02/10  FEV1 1.17/ FVC 1.64 ratio 71%  - 01/25/2015 p extensive coaching HFA effectiveness =    75%  - 01/25/2015 rec  d/c corgard and return for full pfts     When respiratory symptoms begin or become refractory well after a patient reports complete smoking cessation,  Especially when this wasn't the case while they were smoking, a red flag is raised based on the work of Dr Kris Mouton which states:  if you quit smoking when your best day FEV1 is still well preserved it is highly unlikely you will progress to severe disease.  That is to say, once the smoking stops,  the symptoms should not suddenly erupt or markedly worsen.  If so, the differential diagnosis should include  obesity/deconditioning,  LPR/Reflux/Aspiration syndromes,  occult CHF, or  especially side effect of medications commonly used in this population like corgard  rec trial off corgard and return for pfts

## 2015-01-25 NOTE — Progress Notes (Signed)
Subjective:    Patient ID: Katherine Foley, female    DOB: 01-26-1951,    MRN: [id number]  HPI  14 yowf quit smoking 2007 @ dx pneumonia L lung admit cone >  More doe p discharge and required prn saba thereafter and then cabg 2012  With preop spirometry no obst but   downhill since then with dx of copd so referred to pulmonary clinic 01/25/2015 by Dr Dorothy Puffer    01/25/2015 1st [facility] office visit/ Gaurav Baldree  longterm corgard rx originally per Dr Arnoldo Morale  Chief Complaint  Patient presents with  . Pulmonary Consult    Referred by Gwendalyn Ege. Pt states that she was dxed with Emphysema in 2007. She c/o SOB with "doing anything strenuous".  She is using albuterol no more than once per wk.   doe x more than walk flat surfaces nl to sltly slower pace than others  Can't do incline on treadmill/ heat makes it worse Lots of sneezing all year round   No obvious other patterns in day to day or daytime variabilty or assoc chronic cough or cp or chest tightness, subjective wheeze or overt sinus  symptoms. No unusual exp hx or h/o childhood pna/ asthma or knowledge of premature birth.  Sleeping ok without nocturnal  or early am exacerbation  of respiratory  c/o's or need for noct saba. Also denies any obvious fluctuation of symptoms with weather or environmental changes or other aggravating or alleviating factors except as outlined above   Current Medications, Allergies, Complete Past Medical History, Past Surgical History, Family History, and Social History were reviewed in [employer] record.              Review of Systems  Constitutional: Negative for fever, chills and unexpected weight change.  HENT: Positive for sneezing and trouble swallowing. Negative for congestion, dental problem, ear pain, nosebleeds, postnasal drip, rhinorrhea, sinus pressure, sore throat and voice change.   Eyes: Negative for visual disturbance.  Respiratory: Positive for shortness of breath.  Negative for cough and choking.   Cardiovascular: Negative for chest pain and leg swelling.  Gastrointestinal: Positive for abdominal pain. Negative for vomiting and diarrhea.  Genitourinary: Negative for difficulty urinating.       Acid heartburn  Musculoskeletal: Negative for arthralgias.  Skin: Negative for rash.  Neurological: Negative for tremors, syncope and headaches.  Hematological: Does not bruise/bleed easily.       Objective:   Physical Exam  amb wf nad  Wt Readings from Last 3 Encounters:  01/25/15 144 lb (65.318 kg)  01/06/15 149 lb 9.6 oz (67.858 kg)  12/09/14 147 lb 14.4 oz (67.087 kg)    Vital signs reviewed   HEENT: nl dentition, turbinates, and orophanx. Nl external ear canals without cough reflex   NECK :  without JVD/Nodes/TM/ nl carotid upstrokes bilaterally   LUNGS: no acc muscle use, clear to A and P bilaterally without cough on insp or exp maneuvers   CV:  RRR  no s3 or murmur or increase in P2, no edema   ABD:  soft and nontender with pos hoover end insp in the supine position. No bruits or organomegaly, bowel sounds nl  MS:  warm without deformities, calf tenderness, cyanosis or clubbing  SKIN: warm and dry without lesions    NEURO:  alert, approp, no deficits    I personally reviewed images and agree with radiology impression as follows:  CXR:  12/09/14 COPD. There is no pneumonia nor  other acute cardiopulmonary abnormality.          Assessment & Plan:

## 2015-02-06 ENCOUNTER — Other Ambulatory Visit: Payer: Self-pay | Admitting: Family

## 2015-03-04 ENCOUNTER — Other Ambulatory Visit: Payer: Self-pay | Admitting: Internal Medicine

## 2015-03-04 DIAGNOSIS — R0600 Dyspnea, unspecified: Secondary | ICD-10-CM

## 2015-03-07 ENCOUNTER — Other Ambulatory Visit: Payer: Self-pay | Admitting: Family

## 2015-03-08 ENCOUNTER — Encounter: Payer: Self-pay | Admitting: Internal Medicine

## 2015-03-08 ENCOUNTER — Ambulatory Visit (INDEPENDENT_AMBULATORY_CARE_PROVIDER_SITE_OTHER): Payer: BLUE CROSS/BLUE SHIELD | Admitting: Internal Medicine

## 2015-03-08 ENCOUNTER — Encounter (INDEPENDENT_AMBULATORY_CARE_PROVIDER_SITE_OTHER): Payer: Self-pay

## 2015-03-08 VITALS — BP 102/62 | HR 67 | Ht 62.0 in | Wt 143.0 lb

## 2015-03-08 DIAGNOSIS — R0600 Dyspnea, unspecified: Secondary | ICD-10-CM

## 2015-03-08 DIAGNOSIS — J449 Chronic obstructive pulmonary disease, unspecified: Secondary | ICD-10-CM

## 2015-03-08 DIAGNOSIS — I10 Essential (primary) hypertension: Secondary | ICD-10-CM | POA: Diagnosis not present

## 2015-03-08 LAB — PULMONARY FUNCTION TEST
DL/VA % pred: 80 %
DL/VA: 3.64 ml/min/mmHg/L
DLCO unc % pred: 72 %
DLCO unc: 15.67 ml/min/mmHg
FEF 25-75 Post: 0.65 L/sec
FEF 25-75 Pre: 0.46 L/sec
FEF2575-%Change-Post: 40 %
FEF2575-%Pred-Post: 32 %
FEF2575-%Pred-Pre: 23 %
FEV1-%Change-Post: 9 %
FEV1-%Pred-Post: 58 %
FEV1-%Pred-Pre: 53 %
FEV1-Post: 1.28 L
FEV1-Pre: 1.17 L
FEV1FVC-%Change-Post: 2 %
FEV1FVC-%Pred-Pre: 72 %
FEV6-%Change-Post: 5 %
FEV6-%Pred-Post: 77 %
FEV6-%Pred-Pre: 73 %
FEV6-Post: 2.15 L
FEV6-Pre: 2.04 L
FEV6FVC-%Change-Post: -1 %
FEV6FVC-%Pred-Post: 100 %
FEV6FVC-%Pred-Pre: 101 %
FVC-%Change-Post: 7 %
FVC-%Pred-Post: 77 %
FVC-%Pred-Pre: 72 %
FVC-Post: 2.24 L
FVC-Pre: 2.08 L
Post FEV1/FVC ratio: 57 %
Post FEV6/FVC ratio: 96 %
Pre FEV1/FVC ratio: 56 %
Pre FEV6/FVC Ratio: 98 %
RV % pred: 139 %
RV: 2.8 L
TLC % pred: 110 %
TLC: 5.24 L

## 2015-03-08 NOTE — Assessment & Plan Note (Signed)
-   Quit smoking 2007 - spirometry 10/02/10  FEV1 1.17/ FVC 1.64 ratio 71%  - 01/25/2015 p extensive coaching HFA effectiveness =    75%  - 01/25/2015 rec  d/c corgard and return for full pfts  PFTs 03/08/2015   FEV1  1.28 (58%) ratio 57 no change p saba/ dlco 72% p am symb/spiriva   I had an extended discussion with the patient reviewing all relevant studies completed to date and  lasting 15 to 20 minutes of a 25 minute visit    This is GOLD II stable copd/ main issue is maintaining conditioning/ regular walking at submax level/ no change in meds needed   Each maintenance medication was reviewed in detail including most importantly the difference between maintenance and prns and under what circumstances the prns are to be triggered using an action plan format that is not reflected in the computer generated alphabetically organized AVS.    Please see instructions for details which were reviewed in writing and the patient given a copy highlighting the part that I personally wrote and discussed at today's ov.

## 2015-03-08 NOTE — Progress Notes (Signed)
Subjective:    Patient ID: Katherine Foley, female    DOB: 1951/01/08,    MRN: [id number]   Brief patient profile:  70 yowf quit smoking 2007 @ dx pneumonia L lung admit cone >  More doe p discharge and required prn saba thereafter and then AVR/cabg 2012  With preop spirometry no obst but  downhill since then with dx of copd so referred to pulmonary clinic 01/25/2015 by Dr Dorothy Puffer    01/25/2015 1st [facility] office visit/ Katherine Foley  longterm corgard rx originally per Dr Arnoldo Morale  Chief Complaint  Patient presents with  . Pulmonary Consult    Referred by Gwendalyn Ege. Pt states that she was dxed with Emphysema in 2007. She c/o SOB with "doing anything strenuous".  She is using albuterol no more than once per wk.   doe x more than walk flat surfaces nl to sltly slower pace than others  Can't do incline on treadmill/ heat makes it worse Lots of sneezing all year round rec Stop corgard and start bisoprolol 5 mg daily - if too strong break in half, if too weak double to take one twice daily       03/08/2015 f/u ov/Katherine Foley re: spiriva/symbicort / bisoprolol 5 mg daily / no need for saba/   Chief Complaint  Patient presents with  . Follow-up    PFT done today. Pt states her breathing is unchanged. She seldom uses rescue inhaler.       No change DOE = MMRC 1/2  No obvious day to day or daytime variabilty or assoc chronic cough or cp or chest tightness, subjective wheeze overt sinus or hb symptoms. No unusual exp hx or h/o childhood pna/ asthma or knowledge of premature birth.  Sleeping ok without nocturnal  or early am exacerbation  of respiratory  c/o's or need for noct saba. Also denies any obvious fluctuation of symptoms with weather or environmental changes or other aggravating or alleviating factors except as outlined above   Current Medications, Allergies, Complete Past Medical History, Past Surgical History, Family History, and Social History were reviewed in [employer] record.  ROS  The following are not active complaints unless bolded sore throat, dysphagia, dental problems, itching, sneezing,  nasal congestion or excess/ purulent secretions, ear ache,   fever, chills, sweats, unintended wt loss, pleuritic or exertional cp, hemoptysis,  orthopnea pnd or leg swelling, presyncope, palpitations, abdominal pain, anorexia, nausea, vomiting, diarrhea  or change in bowel or urinary habits, change in stools or urine, dysuria,hematuria,  rash, arthralgias, visual complaints, headache, numbness weakness or ataxia or problems with walking or coordination,  change in mood/affect or memory.        Objective:  Physical Exam  amb wf nad   03/08/2015          143  Wt Readings from Last 3 Encounters:  01/25/15 144 lb (65.318 kg)  01/06/15 149 lb 9.6 oz (67.858 kg)  12/09/14 147 lb 14.4 oz (67.087 kg)    Vital signs reviewed   HEENT:  Full dentures/  Nl  turbinates, and orophanx. Nl external ear canals without cough reflex   NECK :  without JVD/Nodes/TM/ nl carotid upstrokes bilaterally   LUNGS: no acc muscle use, clear to A and P bilaterally without cough on insp or exp maneuvers   CV:  RRR  no s3 or murmur or increase in P2, no edema   ABD:  soft and nontender with pos hoover end insp in  the supine position. No bruits or organomegaly, bowel sounds nl  MS:  warm without deformities, calf tenderness, cyanosis or clubbing  SKIN: warm and dry without lesions    NEURO:  alert, approp, no deficits    I personally reviewed images and agree with radiology impression as follows:  CXR:  12/09/14 COPD. There is no pneumonia nor other acute cardiopulmonary abnormality.          Assessment & Plan:

## 2015-03-08 NOTE — Assessment & Plan Note (Signed)
Trial off corgard 01/25/2015 > bisoprolol 5 mg daiy >rec changed to 2.5 mg daily as probably a bit too strong at the 5 mg dose F/u primary care

## 2015-03-08 NOTE — Progress Notes (Signed)
PFT done today. 

## 2015-03-08 NOTE — Patient Instructions (Signed)
Try reduce bisoprolol to one half daily   If loosing ground with you ex tolerance return here right away

## 2015-03-30 ENCOUNTER — Other Ambulatory Visit: Payer: Self-pay | Admitting: Family

## 2015-03-31 ENCOUNTER — Other Ambulatory Visit: Payer: Self-pay | Admitting: Family Medicine

## 2015-04-04 ENCOUNTER — Other Ambulatory Visit: Payer: Self-pay | Admitting: Family

## 2015-04-07 ENCOUNTER — Other Ambulatory Visit: Payer: Self-pay | Admitting: Family

## 2015-05-10 ENCOUNTER — Other Ambulatory Visit: Payer: Self-pay | Admitting: Family

## 2015-05-20 ENCOUNTER — Encounter: Payer: Self-pay | Admitting: Family Medicine

## 2015-05-20 ENCOUNTER — Ambulatory Visit (INDEPENDENT_AMBULATORY_CARE_PROVIDER_SITE_OTHER): Payer: BLUE CROSS/BLUE SHIELD | Admitting: Family Medicine

## 2015-05-20 VITALS — BP 102/76 | HR 70 | Temp 97.9°F | Ht 62.0 in | Wt 142.3 lb

## 2015-05-20 DIAGNOSIS — J069 Acute upper respiratory infection, unspecified: Secondary | ICD-10-CM | POA: Diagnosis not present

## 2015-05-20 DIAGNOSIS — Z23 Encounter for immunization: Secondary | ICD-10-CM

## 2015-05-20 NOTE — Patient Instructions (Signed)
BEFORE YOU LEAVE: -flu shot  INSTRUCTIONS FOR UPPER RESPIRATORY INFECTION:  -plenty of rest and fluids  -nasal saline wash 2-3 times daily (use prepackaged nasal saline or bottled/distilled water if making your own)   -can use AFRIN nasal spray for drainage and nasal congestion - but do NOT use longer then 3-4 days  -can use tylenol (in no history of liver disease) as directed for aches and sorethroat  -in the winter time, using a humidifier at night is helpful (please follow cleaning instructions)  -for sore throat, salt water gargles can help  -follow up if you have fevers, facial pain, tooth pain, difficulty breathing or are worsening or symptoms persist longer then expected  Upper Respiratory Infection, Adult An upper respiratory infection (URI) is also known as the common cold. It is often caused by a type of germ (virus). Colds are easily spread (contagious). You can pass it to others by kissing, coughing, sneezing, or drinking out of the same glass. Usually, you get better in 1 to 3  weeks.  However, the cough can last for even longer. HOME CARE   Only take medicine as told by your doctor. Follow instructions provided above.  Drink enough water and fluids to keep your pee (urine) clear or pale yellow.  Get plenty of rest.  Return to work when your temperature is < 100 for 24 hours or as told by your doctor. You may use a face mask and wash your hands to stop your cold from spreading. GET HELP RIGHT AWAY IF:   After the first 5-7 days, you feel you are getting worse.  You have questions about your medicine.  You have chills, shortness of breath, or red spit (mucus).  You have pain in the face for more then 1-2 days, especially when you bend forward.  You have a fever, puffy (swollen) neck, pain when you swallow, or white spots in the back of your throat.  You have a bad headache, ear pain, sinus pain, or chest pain.  You have a high-pitched whistling sound when you  breathe in and out (wheezing).  You cough up blood.  You have sore muscles or a stiff neck. MAKE SURE YOU:   Understand these instructions.  Will watch your condition.  Will get help right away if you are not doing well or get worse. Document Released: 02/06/2008 Document Revised: 11/12/2011 Document Reviewed: 11/25/2013 [facility] Patient Information 2015 [place], [place]. This information is not intended to replace advice given to you by your health care provider. Make sure you discuss any questions you have with your health care provider.

## 2015-05-20 NOTE — Progress Notes (Signed)
Pre visit review using our clinic review tool, if applicable. No additional management support is needed unless otherwise documented below in the visit note. 

## 2015-05-20 NOTE — Progress Notes (Signed)
HPI:  URI -started: 2 days ago -symptoms:nasal congestion, sore throat, PND, HA, a little achy -denies:fever, SOB, sig cough, wheezing, ear pain, sinus pain, NVD, tooth pain -has tried: nothing -sick contacts/travel/risks: denies flu exposure, tick exposure or or Ebola risks -Hx of: COPD - no albuterol use, breathing problems or sig cough at this point  ROS: See pertinent positives and negatives per HPI.  Past Medical History  Diagnosis Date  . AORTIC STENOSIS 07/07/2010    a. echo 12/23/10: EF 60-65%, mod to severe AS with mean gradient 29 mmHg;  b. 04/2011 s/p bioprosthetic AVR.  Marland Kitchen Barrett's esophagus 04/14/2007  . CAD, NATIVE VESSEL 05/17/2010    a. NSTEMI 8/11 - BMS to RCA;  b. cath 4/12: dLAD 50%, RI 80% (PCI), AVCFX 30%, pRCA 30%, stent ok, 40-50, then 50% dist RCA;  c. s/p Promus DES to RI 12/25/10;  d. 04/2011 CABG x 1 (LIMA->LAD) @ time of AVR  . Carotid stenosis 05/17/2010    a. dopplers 15/72: RICA 62-03%; LICA 5-59% (follow up due 11/12)  . 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 Herpes simplex without mention of complication 7/41/6384  . [place], MILD 08/29/2009  . OSTEOPENIA 04/14/2007  . URINARY INCONTINENCE, STRESS, MILD 08/26/2007  . GERD (gastroesophageal reflux disease)   . Menopause   . Ovarian cyst   . Urine, incontinence, stress female   . Adenomatous colon polyp   . HYPERTENSION 04/14/2007  . Heart murmur   . Anginal pain   . Myocardial infarction 2011    "during catherization" (03/25/2013)  . Hemorrhoid thrombosis     "had it lanced" (03/25/2013)  . Pneumonia     "multiple times" (03/25/2013)  . Chronic bronchitis     "multiple times" (03/25/2013)  . Exertional shortness of breath     "related to COPD problems" (03/25/2013)  . History of blood transfusion     'w/OHS" (03/25/2013)  . H/O hiatal hernia   . MIGRAINE HEADACHE 04/20/2008  . Migraine   . ANXIETY DEPRESSION 02/15/2009  . Asthma   . Barrett's esophagus 2000  . Colon polyp  2004  . COPD (chronic obstructive pulmonary disease)     Past Surgical History  Procedure Laterality Date  . Tonsillectomy and adenoidectomy    . Nasal sinus surgery  1997; 1998    "cust in maxillary sinus; put a stent in then removed it" (03/25/2013)  . Blepharoplasty Bilateral ~ 1998  . Thrombosed hemorrohid lanced    . Ovarian cyst removal Right 2011    "size of a soccer ball" (03/25/2013)  . Appendectomy    . Total abdominal hysterectomy    . Aortic valve replacement  2012  . Cardiac catheterization    . Coronary angioplasty with stent placement  2010    "I have 2 stents" (03/25/2013)  . Cholecystectomy  03/25/2013    w/IOC (03/25/2013)  . Cholecystectomy N/A 03/25/2013    Procedure: LAPAROSCOPIC CHOLECYSTECTOMY WITH INTRAOPERATIVE CHOLANGIOGRAM;  Surgeon: Imogene Burn. Georgette Dover, MD;  Location: [facility];  Service: General;  Laterality: N/A;  . Appendectomy      Family History  Problem Relation Age of Onset  . Coronary artery disease Other   . Heart disease Other     6 stents  . Heart disease Mother   . COPD Father     smoked  . Heart disease Father   . Heart disease Maternal Uncle   . Breast cancer Daughter   . Colon cancer Neg Hx   . Colon  polyps Neg Hx   . Asthma Son     as a child   . Rheum arthritis Daughter   . Rheum arthritis Father   . Breast cancer Daughter     Social History   Social History  . Marital Status: Single    Spouse Name: N/A  . Number of Children: 3  . Years of Education: N/A   Occupational History  . [employer] History Main Topics  . Smoking status: Former Smoker -- 1.50 packs/day for 43 years    Types: Cigarettes    Quit date: 09/03/2005  . Smokeless tobacco: Never Used  . Alcohol Use: 0.0 oz/week    0 Standard drinks or equivalent per week     Comment: 03/25/2013 "I have drank; very rare"  . Drug Use: No  . Sexual Activity: No   Other Topics Concern  . None   Social History Narrative     Current outpatient  prescriptions:  .  albuterol (PROVENTIL HFA;VENTOLIN HFA) 108 (90 BASE) MCG/ACT inhaler, Inhale 2 puffs into the lungs every 6 (six) hours as needed., Disp: 1 Inhaler, Rfl: 1 .  aspirin 81 MG tablet, Take 81 mg by mouth daily.  , Disp: , Rfl:  .  BIOTIN 5000 PO, Take 1 capsule by mouth daily., Disp: , Rfl:  .  bisoprolol (ZEBETA) 5 MG tablet, Take 1 tablet (5 mg total) by mouth daily., Disp: 30 tablet, Rfl: 11 .  budesonide-formoterol (SYMBICORT) 160-4.5 MCG/ACT inhaler, Inhale 2 puffs into the lungs 2 (two) times daily., Disp: 1 Inhaler, Rfl: 5 .  CRESTOR 40 MG tablet, TAKE ONE TABLET BY MOUTH ONCE DAILY, Disp: 30 tablet, Rfl: 6 .  esomeprazole (NEXIUM) 40 MG capsule, TAKE ONE CAPSULE BY MOUTH ONCE DAILY, Disp: 90 capsule, Rfl: 3 .  estradiol (VIVELLE-DOT) 0.05 MG/24HR patch, APPLY ONE PATCH TOPICALLY AND CHANGE TWICE PER WEEK, Disp: 10 patch, Rfl: 0 .  montelukast (SINGULAIR) 10 MG tablet, TAKE ONE TABLET BY MOUTH AT BEDTIME, Disp: 30 tablet, Rfl: 0 .  PRISTIQ 100 MG 24 hr tablet, TAKE ONE TABLET BY MOUTH ONCE DAILY, Disp: 30 tablet, Rfl: 3 .  sertraline (ZOLOFT) 50 MG tablet, TAKE ONE TABLET BY MOUTH ONCE DAILY, Disp: 30 tablet, Rfl: 1 .  SPIRIVA HANDIHALER 18 MCG inhalation capsule, INHALE ONE DOSE BY MOUTH ONCE DAILY, Disp: 30 capsule, Rfl: 6 .  [DISCONTINUED] Aclidinium Bromide (TUDORZA PRESSAIR) 400 MCG/ACT AEPB, Inhale 1 puff into the lungs 2 (two) times daily., Disp: 1 each, Rfl: 11  EXAM:  Filed Vitals:   05/20/15 1055  BP: 102/76  Pulse: 70  Temp: 97.9 F (36.6 C)    Body mass index is 26.02 kg/(m^2).  GENERAL: vitals reviewed and listed above, alert, oriented, appears well hydrated and in no acute distress  HEENT: atraumatic, conjunttiva clear, no obvious abnormalities on inspection of external nose and ears, normal appearance of ear canals and TMs, clear nasal congestion, mild post oropharyngeal erythema with PND, no tonsillar edema or exudate, no sinus TTP  NECK: no  obvious masses on inspection  LUNGS: clear to auscultation bilaterally, no wheezes, rales or rhonchi, good air movement  CV: HRRR, no peripheral edema  MS: moves all extremities without noticeable abnormality  PSYCH: pleasant and cooperative, no obvious depression or anxiety  ASSESSMENT AND PLAN:  Discussed the following assessment and plan:  Acute upper respiratory infection  -given HPI and exam findings today, a serious infection or illness is unlikely. We discussed potential etiologies, with  VURI being most likely, and advised supportive care and monitoring. We discussed treatment side effects, likely course, antibiotic misuse, transmission, and signs of developing a serious illness. -of course, we advised to return or notify a doctor immediately if symptoms worsen or persist or new concerns arise.    Patient Instructions  BEFORE YOU LEAVE: -flu shot  INSTRUCTIONS FOR UPPER RESPIRATORY INFECTION:  -plenty of rest and fluids  -nasal saline wash 2-3 times daily (use prepackaged nasal saline or bottled/distilled water if making your own)   -can use AFRIN nasal spray for drainage and nasal congestion - but do NOT use longer then 3-4 days  -can use tylenol (in no history of liver disease) as directed for aches and sorethroat  -in the winter time, using a humidifier at night is helpful (please follow cleaning instructions)  -for sore throat, salt water gargles can help  -follow up if you have fevers, facial pain, tooth pain, difficulty breathing or are worsening or symptoms persist longer then expected  Upper Respiratory Infection, Adult An upper respiratory infection (URI) is also known as the common cold. It is often caused by a type of germ (virus). Colds are easily spread (contagious). You can pass it to others by kissing, coughing, sneezing, or drinking out of the same glass. Usually, you get better in 1 to 3  weeks.  However, the cough can last for even longer. HOME CARE    Only take medicine as told by your doctor. Follow instructions provided above.  Drink enough water and fluids to keep your pee (urine) clear or pale yellow.  Get plenty of rest.  Return to work when your temperature is < 100 for 24 hours or as told by your doctor. You may use a face mask and wash your hands to stop your cold from spreading. GET HELP RIGHT AWAY IF:   After the first 5-7 days, you feel you are getting worse.  You have questions about your medicine.  You have chills, shortness of breath, or red spit (mucus).  You have pain in the face for more then 1-2 days, especially when you bend forward.  You have a fever, puffy (swollen) neck, pain when you swallow, or white spots in the back of your throat.  You have a bad headache, ear pain, sinus pain, or chest pain.  You have a high-pitched whistling sound when you breathe in and out (wheezing).  You cough up blood.  You have sore muscles or a stiff neck. MAKE SURE YOU:   Understand these instructions.  Will watch your condition.  Will get help right away if you are not doing well or get worse. Document Released: 02/06/2008 Document Revised: 11/12/2011 Document Reviewed: 11/25/2013 [facility] Patient Information 2015 [place], [place]. This information is not intended to replace advice given to you by your health care provider. Make sure you discuss any questions you have with your health care provider.      Colin Benton R.

## 2015-05-30 ENCOUNTER — Other Ambulatory Visit: Payer: Self-pay | Admitting: Family

## 2015-05-31 ENCOUNTER — Inpatient Hospital Stay (HOSPITAL_COMMUNITY)
Admission: EM | Admit: 2015-05-31 | Discharge: 2015-06-04 | DRG: 871 | Disposition: A | Discharge status: Home / Self Care | Payer: BLUE CROSS/BLUE SHIELD | Attending: Internal Medicine | Admitting: Internal Medicine

## 2015-05-31 ENCOUNTER — Encounter (HOSPITAL_COMMUNITY): Payer: Self-pay | Admitting: Emergency Medicine

## 2015-05-31 ENCOUNTER — Emergency Department (HOSPITAL_COMMUNITY): Payer: BLUE CROSS/BLUE SHIELD

## 2015-05-31 DIAGNOSIS — E876 Hypokalemia: Secondary | ICD-10-CM | POA: Diagnosis present

## 2015-05-31 DIAGNOSIS — J441 Chronic obstructive pulmonary disease with (acute) exacerbation: Secondary | ICD-10-CM | POA: Diagnosis present

## 2015-05-31 DIAGNOSIS — J181 Lobar pneumonia, unspecified organism: Secondary | ICD-10-CM

## 2015-05-31 DIAGNOSIS — D649 Anemia, unspecified: Secondary | ICD-10-CM | POA: Diagnosis present

## 2015-05-31 DIAGNOSIS — J9600 Acute respiratory failure, unspecified whether with hypoxia or hypercapnia: Secondary | ICD-10-CM | POA: Diagnosis present

## 2015-05-31 DIAGNOSIS — J449 Chronic obstructive pulmonary disease, unspecified: Secondary | ICD-10-CM | POA: Diagnosis present

## 2015-05-31 DIAGNOSIS — Z7951 Long term (current) use of inhaled steroids: Secondary | ICD-10-CM | POA: Diagnosis not present

## 2015-05-31 DIAGNOSIS — M8580 Other specified disorders of bone density and structure, unspecified site: Secondary | ICD-10-CM | POA: Diagnosis present

## 2015-05-31 DIAGNOSIS — J9601 Acute respiratory failure with hypoxia: Secondary | ICD-10-CM | POA: Diagnosis present

## 2015-05-31 DIAGNOSIS — Z952 Presence of prosthetic heart valve: Secondary | ICD-10-CM | POA: Diagnosis not present

## 2015-05-31 DIAGNOSIS — Z954 Presence of other heart-valve replacement: Secondary | ICD-10-CM

## 2015-05-31 DIAGNOSIS — Z7982 Long term (current) use of aspirin: Secondary | ICD-10-CM

## 2015-05-31 DIAGNOSIS — I25119 Atherosclerotic heart disease of native coronary artery with unspecified angina pectoris: Secondary | ICD-10-CM | POA: Diagnosis present

## 2015-05-31 DIAGNOSIS — R0902 Hypoxemia: Secondary | ICD-10-CM

## 2015-05-31 DIAGNOSIS — I509 Heart failure, unspecified: Secondary | ICD-10-CM

## 2015-05-31 DIAGNOSIS — E785 Hyperlipidemia, unspecified: Secondary | ICD-10-CM | POA: Diagnosis present

## 2015-05-31 DIAGNOSIS — Z951 Presence of aortocoronary bypass graft: Secondary | ICD-10-CM

## 2015-05-31 DIAGNOSIS — I110 Hypertensive heart disease with heart failure: Secondary | ICD-10-CM | POA: Diagnosis present

## 2015-05-31 DIAGNOSIS — E871 Hypo-osmolality and hyponatremia: Secondary | ICD-10-CM | POA: Diagnosis present

## 2015-05-31 DIAGNOSIS — R0789 Other chest pain: Secondary | ICD-10-CM | POA: Diagnosis present

## 2015-05-31 DIAGNOSIS — Z87891 Personal history of nicotine dependence: Secondary | ICD-10-CM | POA: Diagnosis not present

## 2015-05-31 DIAGNOSIS — Z90710 Acquired absence of both cervix and uterus: Secondary | ICD-10-CM

## 2015-05-31 DIAGNOSIS — I2510 Atherosclerotic heart disease of native coronary artery without angina pectoris: Secondary | ICD-10-CM | POA: Diagnosis present

## 2015-05-31 DIAGNOSIS — E869 Volume depletion, unspecified: Secondary | ICD-10-CM | POA: Diagnosis present

## 2015-05-31 DIAGNOSIS — Z79899 Other long term (current) drug therapy: Secondary | ICD-10-CM | POA: Diagnosis not present

## 2015-05-31 DIAGNOSIS — A419 Sepsis, unspecified organism: Principal | ICD-10-CM | POA: Diagnosis present

## 2015-05-31 DIAGNOSIS — Z885 Allergy status to narcotic agent status: Secondary | ICD-10-CM | POA: Diagnosis not present

## 2015-05-31 DIAGNOSIS — J189 Pneumonia, unspecified organism: Secondary | ICD-10-CM | POA: Diagnosis present

## 2015-05-31 DIAGNOSIS — K219 Gastro-esophageal reflux disease without esophagitis: Secondary | ICD-10-CM | POA: Diagnosis present

## 2015-05-31 DIAGNOSIS — Z955 Presence of coronary angioplasty implant and graft: Secondary | ICD-10-CM | POA: Diagnosis not present

## 2015-05-31 DIAGNOSIS — J45909 Unspecified asthma, uncomplicated: Secondary | ICD-10-CM | POA: Diagnosis present

## 2015-05-31 DIAGNOSIS — I10 Essential (primary) hypertension: Secondary | ICD-10-CM | POA: Diagnosis present

## 2015-05-31 DIAGNOSIS — I252 Old myocardial infarction: Secondary | ICD-10-CM

## 2015-05-31 DIAGNOSIS — Z880 Allergy status to penicillin: Secondary | ICD-10-CM | POA: Diagnosis not present

## 2015-05-31 DIAGNOSIS — I5032 Chronic diastolic (congestive) heart failure: Secondary | ICD-10-CM

## 2015-05-31 DIAGNOSIS — I5189 Other ill-defined heart diseases: Secondary | ICD-10-CM | POA: Diagnosis present

## 2015-05-31 DIAGNOSIS — R072 Precordial pain: Secondary | ICD-10-CM | POA: Diagnosis present

## 2015-05-31 LAB — URINALYSIS, ROUTINE W REFLEX MICROSCOPIC
Bilirubin Urine: NEGATIVE
GLUCOSE, UA: NEGATIVE mg/dL
HGB URINE DIPSTICK: NEGATIVE
Ketones, ur: NEGATIVE mg/dL
LEUKOCYTES UA: NEGATIVE
Nitrite: NEGATIVE
PROTEIN: NEGATIVE mg/dL
Urobilinogen, UA: 0.2 mg/dL (ref 0.0–1.0)
pH: 7 (ref 5.0–8.0)

## 2015-05-31 LAB — PROTIME-INR
INR: 1.18 (ref 0.00–1.49)
PROTHROMBIN TIME: 15.2 s (ref 11.6–15.2)

## 2015-05-31 LAB — I-STAT TROPONIN, ED: Troponin i, poc: 0 ng/mL (ref 0.00–0.08)

## 2015-05-31 LAB — CBC
HCT: 30.9 % — ABNORMAL LOW (ref 36.0–46.0)
HEMATOCRIT: 38.1 % (ref 36.0–46.0)
HEMOGLOBIN: 12.3 g/dL (ref 12.0–15.0)
Hemoglobin: 9.9 g/dL — ABNORMAL LOW (ref 12.0–15.0)
MCH: 29.7 pg (ref 26.0–34.0)
MCH: 29.7 pg (ref 26.0–34.0)
MCHC: 32.3 g/dL (ref 30.0–36.0)
MCHC: 32 g/dL (ref 30.0–36.0)
MCV: 92 fL (ref 78.0–100.0)
MCV: 92.8 fL (ref 78.0–100.0)
PLATELETS: 196 10*3/uL (ref 150–400)
PLATELETS: 154 10*3/uL (ref 150–400)
RBC: 4.14 MIL/uL (ref 3.87–5.11)
RBC: 3.33 MIL/uL — AB (ref 3.87–5.11)
RDW: 14.2 % (ref 11.5–15.5)
RDW: 14.4 % (ref 11.5–15.5)
WBC: 11.6 10*3/uL — AB (ref 4.0–10.5)
WBC: 10.7 10*3/uL — AB (ref 4.0–10.5)

## 2015-05-31 LAB — BASIC METABOLIC PANEL
ANION GAP: 11 (ref 5–15)
BUN: 13 mg/dL (ref 6–20)
CHLORIDE: 97 mmol/L — AB (ref 101–111)
CO2: 27 mmol/L (ref 22–32)
CREATININE: 0.54 mg/dL (ref 0.44–1.00)
Calcium: 9 mg/dL (ref 8.9–10.3)
GFR calc non Af Amer: >60 mL/min (ref >60)
Glucose, Bld: 102 mg/dL — ABNORMAL HIGH (ref 65–99)
POTASSIUM: 3.9 mmol/L (ref 3.5–5.1)
SODIUM: 135 mmol/L (ref 135–145)

## 2015-05-31 LAB — I-STAT CHEM 8, ED
BUN: 15 mg/dL (ref 6–20)
CHLORIDE: 98 mmol/L — AB (ref 101–111)
CREATININE: 0.5 mg/dL (ref 0.44–1.00)
Calcium, Ion: 1.08 mmol/L — ABNORMAL LOW (ref 1.13–1.30)
GLUCOSE: 102 mg/dL — AB (ref 65–99)
HEMATOCRIT: 41 % (ref 36.0–46.0)
Hemoglobin: 13.9 g/dL (ref 12.0–15.0)
POTASSIUM: 3.8 mmol/L (ref 3.5–5.1)
Sodium: 134 mmol/L — ABNORMAL LOW (ref 135–145)
TCO2: 25 mmol/L (ref 0–100)

## 2015-05-31 LAB — TROPONIN I
Troponin I: <0.03 ng/mL (ref <0.031)
Troponin I: 0.04 ng/mL — ABNORMAL HIGH (ref <0.031)

## 2015-05-31 LAB — MRSA PCR SCREENING: MRSA BY PCR: NEGATIVE

## 2015-05-31 LAB — STREP PNEUMONIAE URINARY ANTIGEN: STREP PNEUMO URINARY ANTIGEN: NEGATIVE

## 2015-05-31 LAB — CREATININE, SERUM: Creatinine, Ser: 0.53 mg/dL (ref 0.44–1.00)

## 2015-05-31 LAB — PROCALCITONIN: Procalcitonin: 0.1 ng/mL

## 2015-05-31 LAB — APTT: aPTT: 31 seconds (ref 24–37)

## 2015-05-31 LAB — TSH: TSH: 0.357 u[IU]/mL (ref 0.350–4.500)

## 2015-05-31 LAB — I-STAT CG4 LACTIC ACID, ED: Lactic Acid, Venous: 0.56 mmol/L (ref 0.5–2.0)

## 2015-05-31 LAB — LACTIC ACID, PLASMA
Lactic Acid, Venous: 1.1 mmol/L (ref 0.5–2.0)
Lactic Acid, Venous: 0.8 mmol/L (ref 0.5–2.0)

## 2015-05-31 LAB — BRAIN NATRIURETIC PEPTIDE: B NATRIURETIC PEPTIDE 5: 252.8 pg/mL — AB (ref 0.0–100.0)

## 2015-05-31 MED ORDER — ENOXAPARIN SODIUM 40 MG/0.4ML ~~LOC~~ SOLN
40.0000 mg | SUBCUTANEOUS | Status: DC
Start: 2015-05-31 — End: 2015-06-03
  Administered 2015-05-31 – 2015-06-03 (×4): 40 mg via SUBCUTANEOUS
  Filled 2015-05-31 (×4): qty 0.4

## 2015-05-31 MED ORDER — SODIUM CHLORIDE 0.9 % IV BOLUS (SEPSIS)
1000.0000 mL | INTRAVENOUS | Status: DC
  Administered 2015-05-31 (×2): 1000 mL via INTRAVENOUS

## 2015-05-31 MED ORDER — SODIUM CHLORIDE 0.9 % IV BOLUS (SEPSIS)
500.0000 mL | INTRAVENOUS | Status: AC
  Administered 2015-05-31: 500 mL via INTRAVENOUS

## 2015-05-31 MED ORDER — ONDANSETRON HCL 4 MG PO TABS: 4.0000 mg | ORAL_TABLET | Freq: Four times a day (QID) | ORAL | Status: DC | PRN

## 2015-05-31 MED ORDER — SODIUM CHLORIDE 0.9 % IV SOLN
INTRAVENOUS | Status: DC
  Administered 2015-05-31 – 2015-06-03 (×5): via INTRAVENOUS

## 2015-05-31 MED ORDER — VENLAFAXINE HCL ER 150 MG PO CP24
150.0000 mg | ORAL_CAPSULE | Freq: Every day | ORAL | Status: DC
  Administered 2015-06-01 – 2015-06-04 (×4): 150 mg via ORAL
  Filled 2015-05-31 (×5): qty 1

## 2015-05-31 MED ORDER — LEVOFLOXACIN IN D5W 750 MG/150ML IV SOLN
750.0000 mg | INTRAVENOUS | Status: DC
  Administered 2015-06-01 – 2015-06-03 (×3): 750 mg via INTRAVENOUS
  Filled 2015-05-31 (×3): qty 150

## 2015-05-31 MED ORDER — TIOTROPIUM BROMIDE MONOHYDRATE 18 MCG IN CAPS
18.0000 ug | ORAL_CAPSULE | Freq: Every day | RESPIRATORY_TRACT | Status: DC
  Administered 2015-05-31 – 2015-06-04 (×5): 18 ug via RESPIRATORY_TRACT
  Filled 2015-05-31: qty 5

## 2015-05-31 MED ORDER — ASPIRIN EC 81 MG PO TBEC
81.0000 mg | DELAYED_RELEASE_TABLET | Freq: Every day | ORAL | Status: DC
  Administered 2015-05-31 – 2015-06-04 (×5): 81 mg via ORAL
  Filled 2015-05-31 (×9): qty 1

## 2015-05-31 MED ORDER — ROSUVASTATIN CALCIUM 10 MG PO TABS
40.0000 mg | ORAL_TABLET | Freq: Every day | ORAL | Status: DC
  Administered 2015-05-31 – 2015-06-03 (×4): 40 mg via ORAL
  Filled 2015-05-31 (×4): qty 4

## 2015-05-31 MED ORDER — GUAIFENESIN ER 600 MG PO TB12
600.0000 mg | ORAL_TABLET | Freq: Two times a day (BID) | ORAL | Status: DC
  Administered 2015-05-31 – 2015-06-02 (×5): 600 mg via ORAL
  Filled 2015-05-31 (×5): qty 1

## 2015-05-31 MED ORDER — FENTANYL CITRATE (PF) 100 MCG/2ML IJ SOLN
100.0000 ug | Freq: Once | INTRAMUSCULAR | Status: AC
  Administered 2015-05-31: 100 ug via INTRAVENOUS
  Filled 2015-05-31: qty 2

## 2015-05-31 MED ORDER — ALBUTEROL SULFATE (2.5 MG/3ML) 0.083% IN NEBU: 2.5000 mg | INHALATION_SOLUTION | RESPIRATORY_TRACT | Status: DC | PRN

## 2015-05-31 MED ORDER — OXYCODONE HCL 5 MG PO TABS
5.0000 mg | ORAL_TABLET | ORAL | Status: DC | PRN
  Administered 2015-05-31 – 2015-06-03 (×7): 5 mg via ORAL
  Filled 2015-05-31 (×7): qty 1

## 2015-05-31 MED ORDER — BUDESONIDE-FORMOTEROL FUMARATE 160-4.5 MCG/ACT IN AERO
2.0000 | INHALATION_SPRAY | Freq: Two times a day (BID) | RESPIRATORY_TRACT | Status: DC
  Administered 2015-05-31 – 2015-06-02 (×5): 2 via RESPIRATORY_TRACT
  Filled 2015-05-31: qty 6

## 2015-05-31 MED ORDER — ONDANSETRON HCL 4 MG/2ML IJ SOLN: 4.0000 mg | Freq: Four times a day (QID) | INTRAMUSCULAR | Status: DC | PRN

## 2015-05-31 MED ORDER — HYDROMORPHONE HCL 1 MG/ML IJ SOLN
0.5000 mg | INTRAMUSCULAR | Status: DC | PRN
  Administered 2015-05-31: 0.5 mg via INTRAVENOUS
  Filled 2015-05-31: qty 1

## 2015-05-31 MED ORDER — ONDANSETRON HCL 4 MG/2ML IJ SOLN
4.0000 mg | Freq: Once | INTRAMUSCULAR | Status: AC
  Administered 2015-05-31: 4 mg via INTRAVENOUS
  Filled 2015-05-31: qty 2

## 2015-05-31 MED ORDER — PANTOPRAZOLE SODIUM 40 MG PO TBEC
40.0000 mg | DELAYED_RELEASE_TABLET | Freq: Every day | ORAL | Status: DC
Start: 2015-05-31 — End: 2015-06-04
  Administered 2015-05-31 – 2015-06-04 (×5): 40 mg via ORAL
  Filled 2015-05-31 (×5): qty 1

## 2015-05-31 MED ORDER — SODIUM CHLORIDE 0.9 % IV BOLUS (SEPSIS)
1000.0000 mL | Freq: Once | INTRAVENOUS | Status: AC
  Administered 2015-05-31: 1000 mL via INTRAVENOUS

## 2015-05-31 MED ORDER — GUAIFENESIN-DM 100-10 MG/5ML PO SYRP: 5.0000 mL | ORAL_SOLUTION | ORAL | Status: DC | PRN

## 2015-05-31 MED ORDER — LEVOFLOXACIN IN D5W 750 MG/150ML IV SOLN
750.0000 mg | Freq: Once | INTRAVENOUS | Status: DC
  Administered 2015-05-31: 750 mg via INTRAVENOUS
  Filled 2015-05-31: qty 150

## 2015-05-31 MED ORDER — VANCOMYCIN HCL IN DEXTROSE 1-5 GM/200ML-% IV SOLN
1000.0000 mg | Freq: Once | INTRAVENOUS | Status: AC
  Administered 2015-05-31: 1000 mg via INTRAVENOUS
  Filled 2015-05-31: qty 200

## 2015-05-31 MED ORDER — CETYLPYRIDINIUM CHLORIDE 0.05 % MT LIQD
7.0000 mL | Freq: Two times a day (BID) | OROMUCOSAL | Status: DC
  Administered 2015-05-31 – 2015-06-04 (×9): 7 mL via OROMUCOSAL

## 2015-05-31 MED ORDER — IOHEXOL 350 MG/ML SOLN
100.0000 mL | Freq: Once | INTRAVENOUS | Status: AC | PRN
  Administered 2015-05-31: 100 mL via INTRAVENOUS

## 2015-05-31 MED ORDER — ALBUTEROL SULFATE (2.5 MG/3ML) 0.083% IN NEBU
2.5000 mg | INHALATION_SOLUTION | Freq: Four times a day (QID) | RESPIRATORY_TRACT | Status: DC
  Administered 2015-05-31 – 2015-06-01 (×5): 2.5 mg via RESPIRATORY_TRACT
  Filled 2015-05-31 (×6): qty 3

## 2015-05-31 MED ORDER — MONTELUKAST SODIUM 10 MG PO TABS
10.0000 mg | ORAL_TABLET | Freq: Every day | ORAL | Status: DC
  Administered 2015-05-31 – 2015-06-03 (×4): 10 mg via ORAL
  Filled 2015-05-31 (×4): qty 1

## 2015-05-31 MED ORDER — VANCOMYCIN HCL IN DEXTROSE 1-5 GM/200ML-% IV SOLN
1000.0000 mg | Freq: Three times a day (TID) | INTRAVENOUS | Status: DC
  Administered 2015-05-31 – 2015-06-01 (×4): 1000 mg via INTRAVENOUS
  Filled 2015-05-31 (×5): qty 200

## 2015-05-31 MED ORDER — SERTRALINE HCL 50 MG PO TABS
50.0000 mg | ORAL_TABLET | Freq: Every day | ORAL | Status: DC
  Administered 2015-05-31 – 2015-06-04 (×5): 50 mg via ORAL
  Filled 2015-05-31 (×5): qty 1

## 2015-05-31 NOTE — H&P (Addendum)
[facility] History and Physical  Katherine Foley WYB:[id number] DOB: [birth year] DOA: 05/31/2015  Referring physician: ED physician, Margarita Mail  PCP: Kennyth Arnold, FNP   Chief Complaint:   HPI:  Pt is 64 yo female with known chronic diastolic CHF grade 1, hypertension, CAD, status post AV replacement, hyperlipidemia, COPD,  presents to [facility] emergency department with main concern of several days duration of persistent left-sided chest pain, was initially intermittent but now constant, 7/10 in severity, occasionally but not consistently radiating to entire chest area, worse with deep breathing, associated with nonproductive cough, dyspnea with exertion, malaise and poor oral intake, subjective fevers and chills. Patient denies any specific alleviating factors and no similar events in the past. Patient denies any abdominal concerns, no urinary concerns. Patient also denies recent sick contacts or exposures. Patient also denies known weight gain or weight loss, no lower extremity swelling.  In emergency department, patient noted to be in mild distress due to dyspnea chest discomfort. Vital signs notable for T 100.6 F, respiratory rate up to 23 breaths per minute, initial blood pressure 88/57, oxygen saturation 93% on room air. Blood work notable for WBC 11.6, sodium 134. Chest x-ray unremarkable, CT chest concerning for developing left lower pneumonia with atelectasis. Patient started on Levaquin and vancomycin with assistance of pharmacy. TRH asked to admit to step down unit for evaluation and management of sepsis secondary to left lower lobe pneumonia.  Assessment and Plan:  Principal Problem:   Sepsis due to pneumonia - Patient met sepsis criteria with T1 100.6, respiratory rate up to 23, suspected source LLL PNA - Agree with vancomycin and Levaquin per pharmacy for now and readjusting antibiotically regimen as clinically indicated - Follow-up on lactic acid and pro-calcitonin -  Sputum culture and blood culture obtained, urine Legionella and strep pneumo requested as well - Continue to provide supportive care with IVF and monitor in step down unit  Active Problems:   Acute respiratory failure secondary to LLL PNA - sputum culture requested, urine legionella and strep penumo - ABX as noted above and readjust as clinically indicated     Midsternal chest pain - Suspect this is related to principal problem pneumonia - Provide analgesia as needed - Cycle cardiac enzymes and keep on telemetry monitor for now    CAD, NATIVE VESSEL - Continue aspirin per home medical regimen    COPD GOLD II  - No wheezing on exam, continue to provide broncho-dilators scheduled and as needed    HLD (hyperlipidemia) - Continue statin as per home medical regimen    Acute hyponatremia - pre renal - provide IVF and repeat BMP in AM    Essential hypertension - Hold bisoprolol due to low blood pressure - May resume home regimen once blood pressure stable    S/P AVR (aortic valve replacement) - Continue aspirin as per home medical regimen    GERD (gastroesophageal reflux disease) - Continue PPI     DVT prophylaxis - Lovenox SQ  Radiological Exams on Admission: Dg Chest 2 View 05/31/2015   Stable  exam.  No evidence of acute disease.    Ct Angio Chest Pe W/cm &/or Wo Cm 05/31/2015   No pulmonary embolism. 2. Mild patchy curvilinear consolidation and ground-glass opacity in the medial basilar left lower lobe, favor atelectasis, cannot exclude a component of pneumonia.    Code Status: Full Family Communication: Pt  and husband at bedside Disposition Plan: Admit for further evaluation    Mart Piggs [facility] [phone]  Review of Systems:  Constitutional: Negative for diaphoresis.  HENT: Negative for hearing loss, ear pain, nosebleeds, congestion, sore throat, neck pain, tinnitus and ear discharge.   Eyes: Negative for blurred vision, double vision, photophobia, pain, discharge  and redness.  Respiratory: Negative for wheezing and stridor.   Cardiovascular: Negative for claudication and leg swelling.  Gastrointestinal: Negative for heartburn, constipation, blood in stool and melena.  Genitourinary: Negative for dysuria, urgency, frequency, hematuria and flank pain.  Musculoskeletal: Negative for myalgias, back pain, joint pain and falls.  Skin: Negative for itching and rash.  Neurological: Negative for dizziness and weakness.  Endo/Heme/Allergies: Negative for environmental allergies and polydipsia. Does not bruise/bleed easily.  Psychiatric/Behavioral: Negative for suicidal ideas. The patient is not nervous/anxious.      Past Medical History  Diagnosis Date  . AORTIC STENOSIS 07/07/2010    a. echo 12/23/10: EF 60-65%, mod to severe AS with mean gradient 29 mmHg;  b. 04/2011 s/p bioprosthetic AVR.  Marland Kitchen Barrett's esophagus 04/14/2007  . CAD, NATIVE VESSEL 05/17/2010    a. NSTEMI 8/11 - BMS to RCA;  b. cath 4/12: dLAD 50%, RI 80% (PCI), AVCFX 30%, pRCA 30%, stent ok, 40-50, then 50% dist RCA;  c. s/p Promus DES to RI 12/25/10;  d. 04/2011 CABG x 1 (LIMA->LAD) @ time of AVR  . Carotid stenosis 05/17/2010    a. dopplers 16/10: RICA 96-04%; LICA 5-40% (follow up due 11/12)  . 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 Herpes simplex without mention of complication 9/81/1914  . [place], MILD 08/29/2009  . OSTEOPENIA 04/14/2007  . URINARY INCONTINENCE, STRESS, MILD 08/26/2007  . GERD (gastroesophageal reflux disease)   . Menopause   . Ovarian cyst   . Urine, incontinence, stress female   . Adenomatous colon polyp   . HYPERTENSION 04/14/2007  . Heart murmur   . Anginal pain   . Myocardial infarction 2011    "during catherization" (03/25/2013)  . Hemorrhoid thrombosis     "had it lanced" (03/25/2013)  . Pneumonia     "multiple times" (03/25/2013)  . Chronic bronchitis     "multiple times" (03/25/2013)  . Exertional shortness of breath     "related to  COPD problems" (03/25/2013)  . History of blood transfusion     'w/OHS" (03/25/2013)  . H/O hiatal hernia   . MIGRAINE HEADACHE 04/20/2008  . Migraine   . ANXIETY DEPRESSION 02/15/2009  . Asthma   . Barrett's esophagus 2000  . Colon polyp 2004  . COPD (chronic obstructive pulmonary disease)     Past Surgical History  Procedure Laterality Date  . Tonsillectomy and adenoidectomy    . Nasal sinus surgery  1997; 1998    "cust in maxillary sinus; put a stent in then removed it" (03/25/2013)  . Blepharoplasty Bilateral ~ 1998  . Thrombosed hemorrohid lanced    . Ovarian cyst removal Right 2011    "size of a soccer ball" (03/25/2013)  . Appendectomy    . Total abdominal hysterectomy    . Aortic valve replacement  2012  . Cardiac catheterization    . Coronary angioplasty with stent placement  2010    "I have 2 stents" (03/25/2013)  . Cholecystectomy  03/25/2013    w/IOC (03/25/2013)  . Cholecystectomy N/A 03/25/2013    Procedure: LAPAROSCOPIC CHOLECYSTECTOMY WITH INTRAOPERATIVE CHOLANGIOGRAM;  Surgeon: Imogene Burn. Georgette Dover, MD;  Location: [facility];  Service: General;  Laterality: N/A;  . Appendectomy      Social History:  reports that she  quit smoking about 9 years ago. Her smoking use included Cigarettes. She has a 64.5 pack-year smoking history. She has never used smokeless tobacco. She reports that she drinks alcohol. She reports that she does not use illicit drugs.  Allergies  Allergen Reactions  . Codeine Sulfate Rash    REACTION: unspecified  . Hydrocodone-Acetaminophen Rash    REACTION: unspecified  . Amoxicillin-Pot Clavulanate     REACTION: unspecified    Family History  Problem Relation Age of Onset  . Coronary artery disease Other   . Heart disease Other     6 stents  . Heart disease Mother   . COPD Father     smoked  . Heart disease Father   . Heart disease Maternal Uncle   . Breast cancer Daughter   . Colon cancer Neg Hx   . Colon polyps Neg Hx   . Asthma Son     as a  child   . Rheum arthritis Daughter   . Rheum arthritis Father   . Breast cancer Daughter     Medication Sig  albuterol 108 (90 BASE) MCG/ACT inhaler Inhale 2 puffs every 6 hours as needed.  aspirin 81 MG tablet Take 81 mg by mouth daily.    bisoprolol (ZEBETA) 5 MG tablet Take 1 tablet (5 mg total) by mouth daily.  budesonide-formoterol 160-4.5 MCG/ACT inhaler Inhale 2 puffs into the lungs 2 (two) times daily.  CRESTOR 40 MG tablet TAKE ONE TABLET BY MOUTH ONCE DAILY  esomeprazole (NEXIUM) 40 MG capsule TAKE ONE CAPSULE BY MOUTH ONCE DAILY  montelukast (SINGULAIR) 10 MG tablet TAKE ONE TABLET BY MOUTH AT BEDTIME  PRISTIQ 100 MG 24 hr tablet TAKE ONE TABLET BY MOUTH ONCE DAILY  sertraline (ZOLOFT) 50 MG tablet TAKE ONE TABLET BY MOUTH ONCE DAILY  SPIRIVA HANDIHALER 18 MCG inhalation capsule INHALE ONE DOSE BY MOUTH ONCE DAILY   Physical Exam: Filed Vitals:   05/31/15 0815 05/31/15 0816 05/31/15 0830 05/31/15 0836  BP:  100/55 102/55   Pulse:  82 76   Temp: 100 F (37.8 C)     TempSrc: Rectal     Resp:  22 21   Weight:    66.588 kg (146 lb 12.8 oz)  SpO2:  97% 98%     Physical Exam  Constitutional: Appears well-developed and well-nourished. No distress.  HENT: Normocephalic. External right and left ear normal. Oropharynx is clear and moist.  Eyes: Conjunctivae and EOM are normal. PERRLA, no scleral icterus.  Neck: Normal ROM. Neck supple. No JVD. No tracheal deviation. No thyromegaly.  CVS: RRR, S1/S2 +, SEM 3/6, no gallops, no carotid bruit.  Pulmonary: Effort and breath sounds normal,  rhonchi's on the left side, diminished breath sounds at bases, no wheezing  Abdominal: Soft. BS +,  no distension, tenderness, rebound or guarding.  Musculoskeletal: Normal range of motion.  Lymphadenopathy: No lymphadenopathy noted, cervical, inguinal. Neuro: Alert. Normal reflexes, muscle tone coordination. No cranial nerve deficit. Skin: Skin is warm and dry. No rash noted. Not diaphoretic.  No erythema. No pallor.  Psychiatric: Normal mood and affect. Behavior, judgment, thought content normal.   Labs on Admission:  Basic Metabolic Panel:  Recent Labs Lab 05/31/15 0458 05/31/15 0504  NA 134* 135  K 3.8 3.9  CL 98* 97*  CO2  --  27  GLUCOSE 102* 102*  BUN 15 13  CREATININE 0.50 0.54  CALCIUM  --  9.0   CBC:  Recent Labs Lab 05/31/15 0458 05/31/15 0504  WBC  --  11.6*  HGB 13.9 12.3  HCT 41.0 38.1  MCV  --  92.0  PLT  --  196   EKG: pending   If 7PM-7AM, please contact night-coverage [email or web address] Password [facility] 05/31/2015, 8:46 AM

## 2015-05-31 NOTE — ED Notes (Signed)
Attempted report 

## 2015-05-31 NOTE — ED Notes (Signed)
Pt. woke up this morning with left lateral ribcage pain / left axilla pain with SOB , denies nausea or diaphoresis . History of CAD / coronary stents her cardiologist is Dr. Sherren Mocha.

## 2015-05-31 NOTE — ED Provider Notes (Signed)
CSN: [id number]     Arrival date & time 05/31/15  7062 History   First MD Initiated Contact with Patient 05/31/15 [phone]     Chief Complaint  Patient presents with  . Chest Pain     (Consider location/radiation/quality/duration/timing/severity/associated sxs/prior Treatment)  Cardiologist: PCP:   The history is provided by the patient, medical records and the spouse. No language interpreter was used.   Katherine Foley is a(n) 64 y.o. female who presents to the emergency department with chief complaint of left-sided chest pain. She has a past medical history of coronary artery disease, status post stent placement, CABG, and aortic valve replacement with bioprosthetic valve in 2012.Past medical history of COPD, non-oxygen dependent. He shouldn't awoke from sleep at 1 AM this morning with pain under her left breast and left chest wall. She describes it as severe, pleuritic. She has associated shortness of breath but denies nausea, diaphoresis, pain in her back, jaw or left arm. She states that her pain is worse with certain positions like laying on the left side or sitting up. She denies a previous history of DVT or PE. She denies any recent unilateral swelling in the lower extremities, confinement, surgeries or injuries. The patient is taking daily oral estrogens. She has a previous history of smoking but does not smoke currently. Patient states that her pain feels like her previous collapsed lung, which occurred after her CT surgery for aortic valve replacement. She states that it does not feel like her previous MIs. She feels that her complaint is pulmonary and not cardiac. She denies fevers, chills, myalgias or other signs of systemic infection. Patient denies abdominal pain, nausea, vomiting. She denies any urinary symptoms. Patient had a recent medication change for her anti-hypertensive medications and states that she has had low pressures since that time. Patient takes a daily aspirin but no  other anticoagulants. Past Medical History  Diagnosis Date  . AORTIC STENOSIS 07/07/2010    a. echo 12/23/10: EF 60-65%, mod to severe AS with mean gradient 29 mmHg;  b. 04/2011 s/p bioprosthetic AVR.  Marland Kitchen Barrett's esophagus 04/14/2007  . CAD, NATIVE VESSEL 05/17/2010    a. NSTEMI 8/11 - BMS to RCA;  b. cath 4/12: dLAD 50%, RI 80% (PCI), AVCFX 30%, pRCA 30%, stent ok, 40-50, then 50% dist RCA;  c. s/p Promus DES to RI 12/25/10;  d. 04/2011 CABG x 1 (LIMA->LAD) @ time of AVR  . Carotid stenosis 05/17/2010    a. dopplers 83/15: RICA 17-61%; LICA 6-07% (follow up due 11/12)  . 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 Herpes simplex without mention of complication 3/71/0626  . [place], MILD 08/29/2009  . OSTEOPENIA 04/14/2007  . URINARY INCONTINENCE, STRESS, MILD 08/26/2007  . GERD (gastroesophageal reflux disease)   . Menopause   . Ovarian cyst   . Urine, incontinence, stress female   . Adenomatous colon polyp   . HYPERTENSION 04/14/2007  . Heart murmur   . Anginal pain   . Myocardial infarction 2011    "during catherization" (03/25/2013)  . Hemorrhoid thrombosis     "had it lanced" (03/25/2013)  . Pneumonia     "multiple times" (03/25/2013)  . Chronic bronchitis     "multiple times" (03/25/2013)  . Exertional shortness of breath     "related to COPD problems" (03/25/2013)  . History of blood transfusion     'w/OHS" (03/25/2013)  . H/O hiatal hernia   . MIGRAINE HEADACHE 04/20/2008  . Migraine   . ANXIETY DEPRESSION  02/15/2009  . Asthma   . Barrett's esophagus 2000  . Colon polyp 2004  . COPD (chronic obstructive pulmonary disease)    Past Surgical History  Procedure Laterality Date  . Tonsillectomy and adenoidectomy    . Nasal sinus surgery  1997; 1998    "cust in maxillary sinus; put a stent in then removed it" (03/25/2013)  . Blepharoplasty Bilateral ~ 1998  . Thrombosed hemorrohid lanced    . Ovarian cyst removal Right 2011    "size of a soccer ball"  (03/25/2013)  . Appendectomy    . Total abdominal hysterectomy    . Aortic valve replacement  2012  . Cardiac catheterization    . Coronary angioplasty with stent placement  2010    "I have 2 stents" (03/25/2013)  . Cholecystectomy  03/25/2013    w/IOC (03/25/2013)  . Cholecystectomy N/A 03/25/2013    Procedure: LAPAROSCOPIC CHOLECYSTECTOMY WITH INTRAOPERATIVE CHOLANGIOGRAM;  Surgeon: Imogene Burn. Georgette Dover, MD;  Location: [facility];  Service: General;  Laterality: N/A;  . Appendectomy     Family History  Problem Relation Age of Onset  . Coronary artery disease Other   . Heart disease Other     6 stents  . Heart disease Mother   . COPD Father     smoked  . Heart disease Father   . Heart disease Maternal Uncle   . Breast cancer Daughter   . Colon cancer Neg Hx   . Colon polyps Neg Hx   . Asthma Son     as a child   . Rheum arthritis Daughter   . Rheum arthritis Father   . Breast cancer Daughter    Social History  Substance Use Topics  . Smoking status: Former Smoker -- 1.50 packs/day for 43 years    Types: Cigarettes    Quit date: 09/03/2005  . Smokeless tobacco: Never Used  . Alcohol Use: Yes   OB History    No data available     Review of Systems Ten systems reviewed and are negative for acute change, except as noted in the HPI.     Allergies  Codeine sulfate; Hydrocodone-acetaminophen; and Amoxicillin-pot clavulanate  Home Medications   Prior to Admission medications   Medication Sig Start Date End Date Taking? Authorizing Provider  albuterol (PROVENTIL HFA;VENTOLIN HFA) 108 (90 BASE) MCG/ACT inhaler Inhale 2 puffs into the lungs every 6 (six) hours as needed. 11/25/13  Yes Lucretia Kern, DO  aspirin 81 MG tablet Take 81 mg by mouth daily.     Yes Historical Provider, MD  BIOTIN 5000 PO Take 1 capsule by mouth daily.   Yes Historical Provider, MD  bisoprolol (ZEBETA) 5 MG tablet Take 1 tablet (5 mg total) by mouth daily. 01/25/15  Yes Tanda Rockers, MD    budesonide-formoterol [facility]) 160-4.5 MCG/ACT inhaler Inhale 2 puffs into the lungs 2 (two) times daily. 11/22/14  Yes Laurey Morale, MD  CRESTOR 40 MG tablet TAKE ONE TABLET BY MOUTH ONCE DAILY 04/04/15  Yes Kennyth Arnold, FNP  esomeprazole (NEXIUM) 40 MG capsule TAKE ONE CAPSULE BY MOUTH ONCE DAILY 12/20/14  Yes Kennyth Arnold, FNP  estradiol (VIVELLE-DOT) 0.05 MG/24HR patch APPLY ONE PATCH TOPICALLY AND CHANGE TWICE PER WEEK 03/08/15  Yes Kennyth Arnold, FNP  montelukast (SINGULAIR) 10 MG tablet TAKE ONE TABLET BY MOUTH AT BEDTIME 05/11/15  Yes Kennyth Arnold, FNP  PRISTIQ 100 MG 24 hr tablet TAKE ONE TABLET BY MOUTH ONCE DAILY 02/07/15  Yes  Kennyth Arnold, FNP  sertraline (ZOLOFT) 50 MG tablet TAKE ONE TABLET BY MOUTH ONCE DAILY 04/08/15  Yes Kennyth Arnold, FNP  SPIRIVA HANDIHALER 18 MCG inhalation capsule INHALE ONE DOSE BY MOUTH ONCE DAILY 04/04/15  Yes Kennyth Arnold, FNP   BP 110/66 mmHg  Pulse 81  Temp(Src) 100.6 F (38.1 C) (Oral)  Resp 15  SpO2 93% Physical Exam  Constitutional: She is oriented to person, place, and time. She appears well-developed and well-nourished. No distress.  The patient appears very uncomfortable. She has to stop speaking at times due to severe pain. She grabs her left rib cage moaning and wincing. 02 saturations 88% on RA, increased >90% on 2L 02 nasal cannula.   HENT:  Head: Normocephalic and atraumatic.  Eyes: Conjunctivae and EOM are normal. Pupils are equal, round, and reactive to light. No scleral icterus.  Neck: Normal range of motion.  Cardiovascular: Normal rate, regular rhythm and normal heart sounds.  Exam reveals no gallop and no friction rub.   No murmur heard. Pulmonary/Chest: No respiratory distress.    Patient guarding and splinting with breathing.  Abdominal: Soft. Bowel sounds are normal. She exhibits no distension and no mass. There is no tenderness. There is no guarding.  Neurological: She is alert and oriented to person, place, and time.   Skin: Skin is warm and dry. She is not diaphoretic.  Nursing note and vitals reviewed.   ED Course  Procedures (including critical care time) Labs Review Labs Reviewed  BASIC METABOLIC PANEL - Abnormal; Notable for the following:    Chloride 97 (*)    Glucose, Bld 102 (*)    All other components within normal limits  CBC - Abnormal; Notable for the following:    WBC 11.6 (*)    All other components within normal limits  I-STAT CHEM 8, ED - Abnormal; Notable for the following:    Sodium 134 (*)    Chloride 98 (*)    Glucose, Bld 102 (*)    Calcium, Ion 1.08 (*)    All other components within normal limits  I-STAT TROPOININ, ED    Imaging Review Dg Chest 2 View  05/31/2015   CLINICAL DATA:  Chest pain  EXAM: CHEST  2 VIEW  COMPARISON:  12/09/2014  FINDINGS: Normal heart size and stable aortic tortuosity post median sternotomy for aortic valve replacement and probable CABG. Chronic mild scarring at the left base, likely postoperative. There is no edema, consolidation, effusion, or pneumothorax. No osseous findings to explain chest pain. Hyperinflation correlating with history of COPD. Cholecystectomy changes.  IMPRESSION: Stable exam.  No evidence of acute disease.   Electronically Signed   By: Monte Fantasia M.D.   On: 05/31/2015 05:37   I have personally reviewed and evaluated these images and lab results as part of my medical decision-making.   EKG Interpretation None      MDM   Final diagnoses:  CAP (community acquired pneumonia)    8:12 AM BP 94/53 mmHg  Pulse 76  Temp(Src) 100.6 F (38.1 C) (Oral)  Resp 17  SpO2 96% Patient with pleuritic cp. Bracing guarding. Mildly elevated temp at 100.6  Hypoxic and pressures trending down. Sepsis protocol initiated tx for CAP.  8:48 AM  Filed Vitals:   05/31/15 0815 05/31/15 0816 05/31/15 0830 05/31/15 0836  BP:  100/55 102/55   Pulse:  82 76   Temp: 100 F (37.8 C)     TempSrc: Rectal     Resp:  22 21    Weight:    146 lb 12.8 oz (66.588 kg)  SpO2:  97% 98%    Patient receiving weight based fluid resuscitation levaquin and vanc given for presumed CAP. Patient admitted to stepdown.   Margarita Mail, PA-C 05/31/15 [id number]  April Palumbo, MD 06/28/15 [phone]

## 2015-05-31 NOTE — Progress Notes (Signed)
ANTIBIOTIC CONSULT NOTE - INITIAL  Pharmacy Consult for vancomycin and levaquin Indication: CAP  Allergies  Allergen Reactions  . Codeine Sulfate Rash    REACTION: unspecified  . Hydrocodone-Acetaminophen Rash    REACTION: unspecified  . Amoxicillin-Pot Clavulanate     REACTION: unspecified    Patient Measurements:   Weight: 64.5 kg  Vital Signs: Temp: 100.6 F (38.1 C) (09/27 0437) Temp Source: Oral (09/27 0437) BP: 88/57 mmHg (09/27 0800) Pulse Rate: 79 (09/27 0800) Intake/Output from previous day:   Intake/Output from this shift:    Labs:  Recent Labs  05/31/15 0458 05/31/15 0504  WBC  --  11.6*  HGB 13.9 12.3  PLT  --  196  CREATININE 0.50 0.54   Estimated Creatinine Clearance: 61 mL/min (by C-G formula based on Cr of 0.54). No results for input(s): VANCOTROUGH, VANCOPEAK, VANCORANDOM, GENTTROUGH, GENTPEAK, GENTRANDOM, TOBRATROUGH, TOBRAPEAK, TOBRARND, AMIKACINPEAK, AMIKACINTROU, AMIKACIN in the last 72 hours.   Microbiology: No results found for this or any previous visit (from the past 720 hour(s)).  Medical History: Past Medical History  Diagnosis Date  . AORTIC STENOSIS 07/07/2010    a. echo 12/23/10: EF 60-65%, mod to severe AS with mean gradient 29 mmHg;  b. 04/2011 s/p bioprosthetic AVR.  Marland Kitchen Barrett's esophagus 04/14/2007  . CAD, NATIVE VESSEL 05/17/2010    a. NSTEMI 8/11 - BMS to RCA;  b. cath 4/12: dLAD 50%, RI 80% (PCI), AVCFX 30%, pRCA 30%, stent ok, 40-50, then 50% dist RCA;  c. s/p Promus DES to RI 12/25/10;  d. 04/2011 CABG x 1 (LIMA->LAD) @ time of AVR  . Carotid stenosis 05/17/2010    a. dopplers 93/26: RICA 71-24%; LICA 5-80% (follow up due 11/12)  . 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 Herpes simplex without mention of complication 9/98/3382  . [place], MILD 08/29/2009  . OSTEOPENIA 04/14/2007  . URINARY INCONTINENCE, STRESS, MILD 08/26/2007  . GERD (gastroesophageal reflux disease)   . Menopause   . Ovarian cyst    . Urine, incontinence, stress female   . Adenomatous colon polyp   . HYPERTENSION 04/14/2007  . Heart murmur   . Anginal pain   . Myocardial infarction 2011    "during catherization" (03/25/2013)  . Hemorrhoid thrombosis     "had it lanced" (03/25/2013)  . Pneumonia     "multiple times" (03/25/2013)  . Chronic bronchitis     "multiple times" (03/25/2013)  . Exertional shortness of breath     "related to COPD problems" (03/25/2013)  . History of blood transfusion     'w/OHS" (03/25/2013)  . H/O hiatal hernia   . MIGRAINE HEADACHE 04/20/2008  . Migraine   . ANXIETY DEPRESSION 02/15/2009  . Asthma   . Barrett's esophagus 2000  . Colon polyp 2004  . COPD (chronic obstructive pulmonary disease)     Assessment: 64 yo F in ED with CP - sepsis protocol initiated for CAP.  Pharmacy consulted to dose vancomycin and levaquin for CAP.  WBC 11.6, creat 0.54, lactate WNL, temp 100.6, wt 64.5. CT chest neg for PE, LLL findings favor atelectasis, cannot rule out a component of PNA.   Goal of Therapy:  Vancomycin trough level 15-20 mcg/ml  Plan:  -levaquin 750 mg IV q24 -vancomycin 1 gm IV q8h - f/u renal fxn closely, f/u wbc, temp, culture data -vancomycin levels as needed  Eudelia Bunch, Pharm.D. [phone] 05/31/2015 8:28 AM

## 2015-05-31 NOTE — ED Notes (Signed)
Patient transported to CT without distress 

## 2015-05-31 NOTE — Progress Notes (Signed)
Patient admitted to 2C09. Patient A&O x4. Denies any pain at this time. Patient's boyfriend at bedside.

## 2015-05-31 NOTE — ED Notes (Signed)
Patient transported to X-ray 

## 2015-06-01 DIAGNOSIS — J189 Pneumonia, unspecified organism: Secondary | ICD-10-CM

## 2015-06-01 DIAGNOSIS — A419 Sepsis, unspecified organism: Principal | ICD-10-CM

## 2015-06-01 LAB — CBC
HEMATOCRIT: 29.6 % — AB (ref 36.0–46.0)
Hemoglobin: 9.6 g/dL — ABNORMAL LOW (ref 12.0–15.0)
MCH: 30 pg (ref 26.0–34.0)
MCHC: 32.4 g/dL (ref 30.0–36.0)
MCV: 92.5 fL (ref 78.0–100.0)
PLATELETS: 134 10*3/uL — AB (ref 150–400)
RBC: 3.2 MIL/uL — ABNORMAL LOW (ref 3.87–5.11)
RDW: 14.5 % (ref 11.5–15.5)
WBC: 9.3 10*3/uL (ref 4.0–10.5)

## 2015-06-01 LAB — LEGIONELLA PNEUMOPHILA SEROGP 1 UR AG: L. PNEUMOPHILA SEROGP 1 UR AG: NEGATIVE

## 2015-06-01 LAB — BASIC METABOLIC PANEL
Anion gap: 6 (ref 5–15)
BUN: 5 mg/dL — AB (ref 6–20)
CHLORIDE: 103 mmol/L (ref 101–111)
CO2: 25 mmol/L (ref 22–32)
CREATININE: 0.52 mg/dL (ref 0.44–1.00)
Calcium: 7.8 mg/dL — ABNORMAL LOW (ref 8.9–10.3)
GFR calc Af Amer: >60 mL/min (ref >60)
GFR calc non Af Amer: >60 mL/min (ref >60)
GLUCOSE: 136 mg/dL — AB (ref 65–99)
Potassium: 3.2 mmol/L — ABNORMAL LOW (ref 3.5–5.1)
SODIUM: 134 mmol/L — AB (ref 135–145)

## 2015-06-01 LAB — VANCOMYCIN, TROUGH: Vancomycin Tr: 15 ug/mL (ref 10.0–20.0)

## 2015-06-01 LAB — HIV ANTIBODY (ROUTINE TESTING W REFLEX): HIV Screen 4th Generation wRfx: NONREACTIVE

## 2015-06-01 MED ORDER — ALBUTEROL SULFATE (2.5 MG/3ML) 0.083% IN NEBU
2.5000 mg | INHALATION_SOLUTION | Freq: Four times a day (QID) | RESPIRATORY_TRACT | Status: DC
  Administered 2015-06-01 – 2015-06-03 (×7): 2.5 mg via RESPIRATORY_TRACT
  Filled 2015-06-01 (×6): qty 3

## 2015-06-01 MED ORDER — POTASSIUM CHLORIDE CRYS ER 20 MEQ PO TBCR
40.0000 meq | EXTENDED_RELEASE_TABLET | Freq: Once | ORAL | Status: AC
  Administered 2015-06-01: 40 meq via ORAL
  Filled 2015-06-01: qty 2

## 2015-06-01 NOTE — Progress Notes (Signed)
[place] for vancomycin and levaquin Indication: CAP  Allergies  Allergen Reactions  . Codeine Sulfate Rash    REACTION: unspecified  . Hydrocodone-Acetaminophen Rash    REACTION: unspecified  . Amoxicillin-Pot Clavulanate     REACTION: unspecified    Patient Measurements: Height: 5\' 2"  (157.5 cm) Weight: 152 lb 1.9 oz (69 kg) IBW/kg (Calculated) : 50.1 Weight: 64.5 kg  Vital Signs: Temp: 98.5 F (36.9 C) (09/28 1550) Temp Source: Oral (09/28 1550) BP: 120/71 mmHg (09/28 1800) Pulse Rate: 78 (09/28 1800) Intake/Output from previous day: 09/27 0701 - 09/28 0700 In: 1687.5 [I.V.:1487.5; IV Piggyback:200] Out: 7169 [Urine:3650] Intake/Output from this shift: Total I/O In: 2510 [P.O.:1060; I.V.:900; IV Piggyback:550] Out: 3500 [Urine:3500]  Labs:  Recent Labs  05/31/15 0504 05/31/15 1112 06/01/15 0307  WBC 11.6* 10.7* 9.3  HGB 12.3 9.9* 9.6*  PLT 196 154 134*  CREATININE 0.54 0.53 0.52   Estimated Creatinine Clearance: 63 mL/min (by C-G formula based on Cr of 0.52).  Recent Labs  06/01/15 1625  [place] 15     Microbiology: Recent Results (from the past 720 hour(s))  Culture, blood (routine x 2)     Status: None (Preliminary result)   Collection Time: 05/31/15  4:48 AM  Result Value Ref Range Status   Specimen Description BLOOD RIGHT ANTECUBITAL  Final   Special Requests BOTTLES DRAWN AEROBIC AND ANAEROBIC 10CC  Final   Culture NO GROWTH 1 DAY  Final   Report Status PENDING  Incomplete  Culture, blood (routine x 2)     Status: None (Preliminary result)   Collection Time: 05/31/15  8:09 AM  Result Value Ref Range Status   Specimen Description BLOOD LEFT HAND  Final   Special Requests BOTTLES DRAWN AEROBIC AND ANAEROBIC 4CC  Final   Culture NO GROWTH 1 DAY  Final   Report Status PENDING  Incomplete  MRSA PCR Screening     Status: None   Collection Time: 05/31/15 10:40 AM  Result Value Ref Range Status   MRSA by  PCR NEGATIVE NEGATIVE Final    Comment:        The GeneXpert MRSA Assay (FDA approved for NASAL specimens only), is one component of a comprehensive MRSA colonization surveillance program. It is not intended to diagnose MRSA infection nor to guide or monitor treatment for MRSA infections.     Medical History: Past Medical History  Diagnosis Date  . AORTIC STENOSIS 07/07/2010    a. echo 12/23/10: EF 60-65%, mod to severe AS with mean gradient 29 mmHg;  b. 04/2011 s/p bioprosthetic AVR.  Marland Kitchen Barrett's esophagus 04/14/2007  . CAD, NATIVE VESSEL 05/17/2010    a. NSTEMI 8/11 - BMS to RCA;  b. cath 4/12: dLAD 50%, RI 80% (PCI), AVCFX 30%, pRCA 30%, stent ok, 40-50, then 50% dist RCA;  c. s/p Promus DES to RI 12/25/10;  d. 04/2011 CABG x 1 (LIMA->LAD) @ time of AVR  . Carotid stenosis 05/17/2010    a. dopplers 67/89: RICA 38-10%; LICA 1-75% (follow up due 11/12)  . 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 Herpes simplex without mention of complication 09/04/5850  . [place], MILD 08/29/2009  . OSTEOPENIA 04/14/2007  . URINARY INCONTINENCE, STRESS, MILD 08/26/2007  . GERD (gastroesophageal reflux disease)   . Menopause   . Ovarian cyst   . Urine, incontinence, stress female   . Adenomatous colon polyp   . HYPERTENSION 04/14/2007  . Heart murmur   . Anginal pain   .  Myocardial infarction 2011    "during catherization" (03/25/2013)  . Hemorrhoid thrombosis     "had it lanced" (03/25/2013)  . Pneumonia     "multiple times" (03/25/2013)  . Chronic bronchitis     "multiple times" (03/25/2013)  . Exertional shortness of breath     "related to COPD problems" (03/25/2013)  . History of blood transfusion     'w/OHS" (03/25/2013)  . H/O hiatal hernia   . MIGRAINE HEADACHE 04/20/2008  . Migraine   . ANXIETY DEPRESSION 02/15/2009  . Asthma   . Barrett's esophagus 2000  . Colon polyp 2004  . COPD (chronic obstructive pulmonary disease)     Assessment: 64 yo F in ED with CP -  sepsis protocol initiated for CAP.  Pharmacy consulted to dose vancomycin and levaquin for CAP.  WBC 11.6, creat 0.54, lactate WNL, temp 100.6, wt 64.5. CT chest neg for PE, LLL findings favor atelectasis, cannot rule out a component of PNA.   VT is 15 on vancomycin 1g q8h. Pt is afebrile, WBC trending down to wnl, sCr 0.52 and ngtd on cultures.  Goal of Therapy:  Vancomycin trough level 15-20 mcg/ml  Plan:  Continue vancomycin 1g IV q8h Continue levaquin 750mg  IV q24h F/u renal fxn closely, f/u wbc, temp, culture data Vancomycin levels as needed  Andrey Cota. Diona Foley, PharmD Clinical Pharmacist Pager [phone]  06/01/2015 6:48 PM

## 2015-06-01 NOTE — Progress Notes (Signed)
[place] TEAM 1 - Stepdown/ICU TEAM PROGRESS NOTE  SARAHI BORLAND MGQ:[id number] DOB: 1951-04-07 DOA: 05/31/2015 PCP: Kennyth Arnold, FNP  Admit HPI / Brief Narrative: 64 yo female with known chronic diastolic CHF grade 1, hypertension, CAD, status post AoV replacement, hyperlipidemia, and COPD who presented to [facility] emergency department with several days duration of persistent left-sided chest pain, was initially intermittent but now constant, 7/10 in severity, occasionally but not consistently radiating to entire chest area, worse with deep breathing, associated with nonproductive cough, dyspnea with exertion, malaise and poor oral intake, subjective fevers and chills.   In emergency department, patient noted to be in mild distress. Vital signs notable for T 100.6 F, respiratory rate 23 breaths per minute, blood pressure 88/57, oxygen saturation 93% on room air. Blood work notable for WBC 11.6, sodium 134. Chest x-ray unremarkable, CT chest concerning for developing left lower pneumonia with atelectasis.  HPI/Subjective: The patient complains of ongoing pleuritic type left lateral chest pain.  She denies significant shortness of breath or substernal chest pressure at this time.  She denies nausea vomiting or headache.  Assessment/Plan:  Sepsis due to community acquired pneumonia - Patient met sepsis criteria with T 100.6, respiratory rate 23, LLL PNA - cont empiric abx  - lactic acid normal -  initial pro-calcitonin 0.10 - culture data pending   Acute hypoxic respiratory failure secondary to PNA - requiring 2L [place] O2 support - is normally not on oxygen  L sided chest pain - Suspect this is related to pneumonia - no PE on CTangio - troponin not signif elevated   CAD - Continue aspirin per home medical regimen  COPD GOLD II  - No wheezing on exam, continue to provide broncho-dilators scheduled and as needed  HLD  - Continue statin as per home medical regimen  Acute  hyponatremia - pre renal - provide IVF and repeat BMP in AM  Hypokalemia   Essential hypertension - begin to resume home medical regimen as BP improving   S/P Aortic valve replacement - Continue aspirin as per home medical regimen  GERD  - Continue PPI   Normocytic anemia  - check Fe studies in AM   Code Status: FULL Family Communication: no family present at time of exam Disposition Plan: begin to ambulate   Consultants: none  Procedures: none  Antibiotics: levaquin 9/27 > vanc 9/27   DVT prophylaxis: lovenox  Objective: Blood pressure 105/60, pulse 83, temperature 99 F (37.2 C), temperature source Oral, resp. rate 20, height 5' 2"  (1.575 m), weight 69 kg (152 lb 1.9 oz), SpO2 98 %.  Intake/Output Summary (Last 24 hours) at 06/01/15 1206 Last data filed at 06/01/15 1100  Gross per 24 hour  Intake   2635 ml  Output   5350 ml  Net  -2715 ml   Exam: General: No acute respiratory distress Lungs: Clear to auscultation bilaterally without wheezes or crackles Cardiovascular: Regular rate and rhythm without murmur gallop or rub normal S1 and S2 Abdomen: Nontender, nondistended, soft, bowel sounds positive, no rebound, no ascites, no appreciable mass Extremities: No significant cyanosis, clubbing, or edema bilateral lower extremities  Data Reviewed: Basic Metabolic Panel:  Recent Labs Lab 05/31/15 0458 05/31/15 0504 05/31/15 1112 06/01/15 0307  NA 134* 135  --  134*  K 3.8 3.9  --  3.2*  CL 98* 97*  --  103  CO2  --  27  --  25  GLUCOSE 102* 102*  --  136*  BUN  15 13  --  5*  CREATININE 0.50 0.54 0.53 0.52  CALCIUM  --  9.0  --  7.8*    CBC:  Recent Labs Lab 05/31/15 0458 05/31/15 0504 05/31/15 1112 06/01/15 0307  WBC  --  11.6* 10.7* 9.3  HGB 13.9 12.3 9.9* 9.6*  HCT 41.0 38.1 30.9* 29.6*  MCV  --  92.0 92.8 92.5  PLT  --  196 154 134*    Liver Function Tests: No results for input(s): AST, ALT, ALKPHOS, BILITOT, PROT, ALBUMIN in the  last 168 hours. No results for input(s): LIPASE, AMYLASE in the last 168 hours. No results for input(s): AMMONIA in the last 168 hours.  Coags:  Recent Labs Lab 05/31/15 1112  INR 1.18    Recent Labs Lab 05/31/15 1112  APTT 31    Cardiac Enzymes:  Recent Labs Lab 05/31/15 1112 05/31/15 1543 05/31/15 2215  TROPONINI <0.03 0.04* <0.03    Recent Results (from the past 240 hour(s))  MRSA PCR Screening     Status: None   Collection Time: 05/31/15 10:40 AM  Result Value Ref Range Status   MRSA by PCR NEGATIVE NEGATIVE Final    Comment:        The GeneXpert MRSA Assay (FDA approved for NASAL specimens only), is one component of a comprehensive MRSA colonization surveillance program. It is not intended to diagnose MRSA infection nor to guide or monitor treatment for MRSA infections.      Studies:   Recent x-ray studies have been reviewed in detail by the Attending Physician  Scheduled Meds:  Scheduled Meds: . albuterol  2.5 mg Nebulization Q6H  . antiseptic oral rinse  7 mL Mouth Rinse BID  . aspirin EC  81 mg Oral Daily  . budesonide-formoterol  2 puff Inhalation BID  . enoxaparin (LOVENOX) injection  40 mg Subcutaneous Q24H  . guaiFENesin  600 mg Oral BID  . levofloxacin (LEVAQUIN) IV  750 mg Intravenous Q24H  . montelukast  10 mg Oral QHS  . pantoprazole  40 mg Oral Daily  . rosuvastatin  40 mg Oral q1800  . sertraline  50 mg Oral Daily  . tiotropium  18 mcg Inhalation Daily  . vancomycin  1,000 mg Intravenous Q8H  . venlafaxine XR  150 mg Oral Q breakfast    Time spent on care of this patient: 35 mins   MCCLUNG,JEFFREY T , MD   [facility] Office  [phone] Pager - Text Page per Shea Evans as per below:  On-Call/Text Page:      [email or web address]      password [email or web address]  If 7PM-7AM, please contact night-coverage [email or web address] Password [email or web address] 06/01/2015, 12:06 PM   LOS: 1 day

## 2015-06-02 ENCOUNTER — Inpatient Hospital Stay (HOSPITAL_COMMUNITY): Payer: BLUE CROSS/BLUE SHIELD

## 2015-06-02 LAB — CBC
HEMATOCRIT: 29.7 % — AB (ref 36.0–46.0)
Hemoglobin: 9.3 g/dL — ABNORMAL LOW (ref 12.0–15.0)
MCH: 28.8 pg (ref 26.0–34.0)
MCHC: 31.3 g/dL (ref 30.0–36.0)
MCV: 92 fL (ref 78.0–100.0)
PLATELETS: 148 10*3/uL — AB (ref 150–400)
RBC: 3.23 MIL/uL — ABNORMAL LOW (ref 3.87–5.11)
RDW: 14.4 % (ref 11.5–15.5)
WBC: 5.9 10*3/uL (ref 4.0–10.5)

## 2015-06-02 LAB — COMPREHENSIVE METABOLIC PANEL
ALBUMIN: 2.8 g/dL — AB (ref 3.5–5.0)
ALT: 30 U/L (ref 14–54)
AST: 30 U/L (ref 15–41)
Alkaline Phosphatase: 44 U/L (ref 38–126)
Anion gap: 8 (ref 5–15)
CHLORIDE: 103 mmol/L (ref 101–111)
CO2: 25 mmol/L (ref 22–32)
CREATININE: 0.45 mg/dL (ref 0.44–1.00)
Calcium: 8.2 mg/dL — ABNORMAL LOW (ref 8.9–10.3)
GFR calc Af Amer: >60 mL/min (ref >60)
GFR calc non Af Amer: >60 mL/min (ref >60)
GLUCOSE: 112 mg/dL — AB (ref 65–99)
POTASSIUM: 3.9 mmol/L (ref 3.5–5.1)
Sodium: 136 mmol/L (ref 135–145)
TOTAL PROTEIN: 5.2 g/dL — AB (ref 6.5–8.1)
Total Bilirubin: 0.7 mg/dL (ref 0.3–1.2)

## 2015-06-02 LAB — RETICULOCYTES
RBC.: 3.23 MIL/uL — AB (ref 3.87–5.11)
RETIC COUNT ABSOLUTE: 48.5 10*3/uL (ref 19.0–186.0)
Retic Ct Pct: 1.5 % (ref 0.4–3.1)

## 2015-06-02 LAB — VITAMIN B12: Vitamin B-12: 507 pg/mL (ref 180–914)

## 2015-06-02 LAB — IRON AND TIBC
Iron: 15 ug/dL — ABNORMAL LOW (ref 28–170)
Saturation Ratios: 6 % — ABNORMAL LOW (ref 10.4–31.8)
TIBC: 267 ug/dL (ref 250–450)
UIBC: 252 ug/dL

## 2015-06-02 LAB — FOLATE: Folate: 31 ng/mL (ref >5.9)

## 2015-06-02 LAB — FERRITIN: FERRITIN: 44 ng/mL (ref 11–307)

## 2015-06-02 MED ORDER — ZOLPIDEM TARTRATE 5 MG PO TABS
5.0000 mg | ORAL_TABLET | Freq: Every day | ORAL | Status: AC
  Administered 2015-06-02: 5 mg via ORAL
  Filled 2015-06-02 (×2): qty 1

## 2015-06-02 MED ORDER — BUDESONIDE-FORMOTEROL FUMARATE 160-4.5 MCG/ACT IN AERO
2.0000 | INHALATION_SPRAY | Freq: Two times a day (BID) | RESPIRATORY_TRACT | Status: DC
  Administered 2015-06-02 – 2015-06-04 (×4): 2 via RESPIRATORY_TRACT
  Filled 2015-06-02: qty 6

## 2015-06-02 MED ORDER — GUAIFENESIN-DM 100-10 MG/5ML PO SYRP
5.0000 mL | ORAL_SOLUTION | Freq: Two times a day (BID) | ORAL | Status: DC
  Administered 2015-06-02 – 2015-06-04 (×5): 5 mL via ORAL
  Filled 2015-06-02 (×5): qty 5

## 2015-06-02 NOTE — Code Documentation (Signed)
CODE BLUE NOTE  Patient Name: DAPHANIE OQUENDO   MRN: [id number]   Date of Birth/ Sex: [birth year] , female      Admission Date: 05/31/2015  Attending Provider: Allie Bossier, MD  Primary Diagnosis: Sepsis due to pneumonia    Indication: Patient s/p cardiac sx today with Dr. Lawson Fiscal. This PM, her BP began to drop and she was noted to be in PEA. Code blue was subsequently called. At the time of arrival on scene, ACLS protocol was underway.    Technical Description:  - CPR performance duration:  6  minutes  - Was defibrillation or cardioversion used? Yes   - Was external pacer placed? Yes  - Was patient intubated pre/post CPR? Yes, intubated prior to code blue     Medications Administered: Y = Yes; Blank = No Amiodarone  Y-drip prior to code  Atropine    Calcium    Epinephrine  Y x1  Lidocaine    Magnesium    Norepinephrine    Phenylephrine    Sodium bicarbonate    Vasopressin      Post CPR evaluation:  - Final Status - Was patient successfully resuscitated ? Yes -Patient had ROSC, Dr. Missy Sabins and Servando Snare took over, and bedside Echo/EKG was performed.      Miscellaneous Information:  - Labs sent, including: ABGs  - Primary team notified?  Yes  - Family Notified? Yes          Nicolette Bang, DO  06/02/2015, 5:50 PM

## 2015-06-02 NOTE — Care Management Note (Signed)
Case Management Note  Patient Details  Name: Katherine Foley MRN: [id number] Date of Birth: 1951/03/26  Subjective/Objective:  Pt lives with significant other, reports he will be available to assist her when she is medically stable for discharge.  Doing well ambulating with staff, RA O2 sats in the 90s.                   Action/Plan: CM will follow for discharge needs.  Expected Discharge Plan:  Home/Self Care  Discharge planning Services  CM Consult  Status of Service:  In process, will continue to follow  Girard Cooter, RN 06/02/2015, 3:40 PM

## 2015-06-02 NOTE — Progress Notes (Signed)
[name] - Stepdown/ICU TEAM Progress Note  Katherine Foley HUD:[id number] DOB: 28-Jan-1951 DOA: 05/31/2015 PCP: Kennyth Arnold, FNP  Admit HPI / Brief Narrative: 64 yo WF PMHx  Chronic Diastolic CHF grade 1, hypertension, CAD, S/P  bioprosthetic AoV replacement, Collapsed Lt lung as complication of AoV replacement, hyperlipidemia, and COPD not on home oxygen.  Presented to [facility] emergency department with several days duration of persistent left-sided chest pain, was initially intermittent but now constant, 7/10 in severity, occasionally but not consistently radiating to entire chest area, worse with deep breathing, associated with nonproductive cough, dyspnea with exertion, malaise and poor oral intake, subjective fevers and chills.   In emergency department, patient noted to be in mild distress. Vital signs notable for T 100.6 F, respiratory rate 23 breaths per minute, blood pressure 88/57, oxygen saturation 93% on room air. Blood work notable for WBC 11.6, sodium 134. Chest x-ray unremarkable, CT chest concerning for developing left lower pneumonia with atelectasis.  HPI/Subjective: 9/29 A/O 4, positive pain with deep inspiration on left lateral aspect of chest wall.  Assessment/Plan:  Sepsis due to community acquired pneumonia - Patient met sepsis criteria with T 100.6, respiratory rate 23, LLL PNA - cont empiric abx  - Flutter valve -Mucinex DM -Physiotherapy vest TID -Normal saline 139m/hr   Acute hypoxic respiratory failure secondary to PNA - requiring 2L [place] O2 support - is normally not on oxygen  L sided chest pain - Suspect this is related to pneumonia - no PE on CT Angio - troponin not signif elevated   Diastolic dysfunction -Strict in and out  -Daily a.m. weight  CAD - Continue aspirin per home medical regimen  COPD GOLD II  - No wheezing on exam, continue to provide broncho-dilators scheduled and as needed  HLD  - Continue statin as per home  medical regimen  Acute hyponatremia - Resolved   Hypokalemia  -Resolved  Essential hypertension - Currently still soft BP hold medications    S/P Aortic valve replacement - Continue aspirin as per home medical regimen  GERD  - Continue PPI   Normocytic anemia  - check Fe studies in AM     Code Status: FULL Family Communication: family present at time of exam Disposition Plan: Resolution sepsis    Consultants:   Procedure/Significant Events: 9/29 PCXR;Compared to CT 2 days ago, possible superimposed bronchopneumonia. Small left pleural effusion.   Culture 9/27 blood Rt AC/Lt hand NGTD  9/27 MRSA by PCR negative 9/27 HIV negative 9/27 strep pneumo/Legionella urine antigen negative   Antibiotics: levaquin 9/27 > vanc 9/27 >> stopped 9/27   DVT prophylaxis: lovenox    Devices  LINES / TUBES:      Continuous Infusions: . sodium chloride 50 mL/hr at 06/01/15 1953    Objective: VITAL SIGNS: Temp: 97.9 F (36.6 C) (09/29 0400) Temp Source: Oral (09/29 0400) BP: 124/71 mmHg (09/29 0758) Pulse Rate: 79 (09/29 0758) SPO2; FIO2:   Intake/Output Summary (Last 24 hours) at 06/02/15 0817 Last data filed at 06/02/15 0600  Gross per 24 hour  Intake 3562.08 ml  Output   5400 ml  Net -1837.92 ml     Exam: General: A/O 4, NAD, positive acute respiratory distress Eyes: Negative headache, eye pain, double vision,negative scleral hemorrhage ENT: Negative Runny nose, negative ear pain, negative gingival bleeding, Neck:  Negative scars, masses, torticollis, lymphadenopathy, JVD Lungs: Clear to auscultation bilaterally, except by basilar crackles Lt > Rt , without wheezes  Cardiovascular: Regular rate  and rhythm without murmur gallop or rub normal S1 and S2 Abdomen:negative abdominal pain, nondistended, positive soft, bowel sounds, no rebound, no ascites, no appreciable mass Extremities: No significant cyanosis, clubbing, or edema bilateral lower  extremities Psychiatric:  Negative depression, negative anxiety, negative fatigue, negative mania Neurologic:  Cranial nerves II through XII intact, tongue/uvula midline, all extremities muscle strength 5/5, sensation intact throughout, negative dysarthria, negative expressive aphasia, negative receptive aphasia.   Data Reviewed: Basic Metabolic Panel:  Recent Labs Lab 05/31/15 0458 05/31/15 0504 05/31/15 1112 06/01/15 0307 06/02/15 0224  NA 134* 135  --  134* 136  K 3.8 3.9  --  3.2* 3.9  CL 98* 97*  --  103 103  CO2  --  27  --  25 25  GLUCOSE 102* 102*  --  136* 112*  BUN 15 13  --  5* <5*  CREATININE 0.50 0.54 0.53 0.52 0.45  CALCIUM  --  9.0  --  7.8* 8.2*   Liver Function Tests:  Recent Labs Lab 06/02/15 0224  AST 30  ALT 30  ALKPHOS 44  BILITOT 0.7  PROT 5.2*  ALBUMIN 2.8*   No results for input(s): LIPASE, AMYLASE in the last 168 hours. No results for input(s): AMMONIA in the last 168 hours. CBC:  Recent Labs Lab 05/31/15 0458 05/31/15 0504 05/31/15 1112 06/01/15 0307 06/02/15 0224  WBC  --  11.6* 10.7* 9.3 5.9  HGB 13.9 12.3 9.9* 9.6* 9.3*  HCT 41.0 38.1 30.9* 29.6* 29.7*  MCV  --  92.0 92.8 92.5 92.0  PLT  --  196 154 134* 148*   Cardiac Enzymes:  Recent Labs Lab 05/31/15 1112 05/31/15 1543 05/31/15 2215  TROPONINI <0.03 0.04* <0.03   BNP (last 3 results)  Recent Labs  05/31/15 0849  BNP 252.8*    ProBNP (last 3 results) No results for input(s): PROBNP in the last 8760 hours.  CBG: No results for input(s): GLUCAP in the last 168 hours.  Recent Results (from the past 240 hour(s))  Culture, blood (routine x 2)     Status: None (Preliminary result)   Collection Time: 05/31/15  4:48 AM  Result Value Ref Range Status   Specimen Description BLOOD RIGHT ANTECUBITAL  Final   Special Requests BOTTLES DRAWN AEROBIC AND ANAEROBIC 10CC  Final   Culture NO GROWTH 1 DAY  Final   Report Status PENDING  Incomplete  Culture, blood (routine  x 2)     Status: None (Preliminary result)   Collection Time: 05/31/15  8:09 AM  Result Value Ref Range Status   Specimen Description BLOOD LEFT HAND  Final   Special Requests BOTTLES DRAWN AEROBIC AND ANAEROBIC 4CC  Final   Culture NO GROWTH 1 DAY  Final   Report Status PENDING  Incomplete  MRSA PCR Screening     Status: None   Collection Time: 05/31/15 10:40 AM  Result Value Ref Range Status   MRSA by PCR NEGATIVE NEGATIVE Final    Comment:        The GeneXpert MRSA Assay (FDA approved for NASAL specimens only), is one component of a comprehensive MRSA colonization surveillance program. It is not intended to diagnose MRSA infection nor to guide or monitor treatment for MRSA infections.      Studies:  Recent x-ray studies have been reviewed in detail by the Attending Physician  Scheduled Meds:  Scheduled Meds: . albuterol  2.5 mg Nebulization QID  . antiseptic oral rinse  7 mL Mouth Rinse BID  .  aspirin EC  81 mg Oral Daily  . budesonide-formoterol  2 puff Inhalation BID  . enoxaparin (LOVENOX) injection  40 mg Subcutaneous Q24H  . guaiFENesin  600 mg Oral BID  . levofloxacin (LEVAQUIN) IV  750 mg Intravenous Q24H  . montelukast  10 mg Oral QHS  . pantoprazole  40 mg Oral Daily  . rosuvastatin  40 mg Oral q1800  . sertraline  50 mg Oral Daily  . tiotropium  18 mcg Inhalation Daily  . venlafaxine XR  150 mg Oral Q breakfast    Time spent on care of this patient: 40 mins   WOODS, Geraldo Docker , MD  [facility] Office  [phone] Pager [phone]  On-Call/Text Page:      [email or web address]      password [email or web address]  If 7PM-7AM, please contact night-coverage [email or web address] Password [email or web address] 06/02/2015, 8:17 AM   LOS: 2 days   Care during the described time interval was provided by me .  I have reviewed this patient's available data, including medical history, events of note, physical examination, and all test results as part of my evaluation. I have personally  reviewed and interpreted all radiology studies.   Dia Crawford, MD [phone] Pager

## 2015-06-03 ENCOUNTER — Inpatient Hospital Stay (HOSPITAL_COMMUNITY): Payer: BLUE CROSS/BLUE SHIELD

## 2015-06-03 LAB — COMPREHENSIVE METABOLIC PANEL
ALK PHOS: 47 U/L (ref 38–126)
ALT: 46 U/L (ref 14–54)
AST: 46 U/L — ABNORMAL HIGH (ref 15–41)
Albumin: 2.8 g/dL — ABNORMAL LOW (ref 3.5–5.0)
Anion gap: 8 (ref 5–15)
BILIRUBIN TOTAL: 0.3 mg/dL (ref 0.3–1.2)
BUN: <5 mg/dL — ABNORMAL LOW (ref 6–20)
CALCIUM: 8.5 mg/dL — AB (ref 8.9–10.3)
CO2: 26 mmol/L (ref 22–32)
CREATININE: 0.46 mg/dL (ref 0.44–1.00)
Chloride: 103 mmol/L (ref 101–111)
GFR calc non Af Amer: >60 mL/min (ref >60)
Glucose, Bld: 114 mg/dL — ABNORMAL HIGH (ref 65–99)
Potassium: 4 mmol/L (ref 3.5–5.1)
SODIUM: 137 mmol/L (ref 135–145)
TOTAL PROTEIN: 5.6 g/dL — AB (ref 6.5–8.1)

## 2015-06-03 LAB — CBC WITH DIFFERENTIAL/PLATELET
BASOS ABS: 0 10*3/uL (ref 0.0–0.1)
BASOS PCT: 0 %
EOS ABS: 0 10*3/uL (ref 0.0–0.7)
Eosinophils Relative: 0 %
HCT: 28.6 % — ABNORMAL LOW (ref 36.0–46.0)
HEMOGLOBIN: 9.3 g/dL — AB (ref 12.0–15.0)
Lymphocytes Relative: 11 %
Lymphs Abs: 0.6 10*3/uL — ABNORMAL LOW (ref 0.7–4.0)
MCH: 29.8 pg (ref 26.0–34.0)
MCHC: 32.5 g/dL (ref 30.0–36.0)
MCV: 91.7 fL (ref 78.0–100.0)
MONO ABS: 0.5 10*3/uL (ref 0.1–1.0)
MONOS PCT: 10 %
NEUTROS PCT: 79 %
Neutro Abs: 4 10*3/uL (ref 1.7–7.7)
Platelets: 159 10*3/uL (ref 150–400)
RBC: 3.12 MIL/uL — ABNORMAL LOW (ref 3.87–5.11)
RDW: 14.3 % (ref 11.5–15.5)
WBC: 5.1 10*3/uL (ref 4.0–10.5)

## 2015-06-03 LAB — MAGNESIUM: MAGNESIUM: 1.7 mg/dL (ref 1.7–2.4)

## 2015-06-03 MED ORDER — LEVOFLOXACIN 750 MG PO TABS
750.0000 mg | ORAL_TABLET | Freq: Every day | ORAL | Status: DC
  Administered 2015-06-04: 750 mg via ORAL
  Filled 2015-06-03: qty 1

## 2015-06-03 NOTE — Progress Notes (Signed)
Pt ambulates in the hallway on RA no SOB noted, no c/o pain,VSS.

## 2015-06-03 NOTE — Progress Notes (Signed)
[place] TEAM 1 - Stepdown/ICU TEAM PROGRESS NOTE  Katherine Foley MRN:[id number] DOB: 01/14/1951 DOA: 05/31/2015 PCP: Padonda B Webb, FNP  Admit HPI / Brief Narrative: 64 yo female with known chronic diastolic CHF grade 1, hypertension, CAD, status post AoV replacement, hyperlipidemia, and COPD who presented to [facility] emergency department with several days duration of persistent left-sided chest pain, was initially intermittent but now constant, 7/10 in severity, occasionally but not consistently radiating to entire chest area, worse with deep breathing, associated with nonproductive cough, dyspnea with exertion, malaise and poor oral intake, subjective fevers and chills.   In emergency department, patient noted to be in mild distress. Vital signs notable for T 100.6 F, respiratory rate 23 breaths per minute, blood pressure 88/57, oxygen saturation 93% on room air. Blood work notable for WBC 11.6, sodium 134. Chest x-ray unremarkable, CT chest concerning for developing left lower pneumonia with atelectasis.  HPI/Subjective: The pt suffered an episode of signif desaturation last night, requiring upward titration of oxygen to NRB.  She is presently 100% on 2L [place].  The patient states her left-sided chest pain is improving.  She denies current shortness of breath fevers chills nausea vomiting or abdominal pain.  Assessment/Plan:  Sepsis due to community acquired pneumonia - Patient met sepsis criteria with T 100.6, respiratory rate 23, LLL PNA - cont empiric abx  - lactic acid normal -  initial pro-calcitonin 0.10 - culture data unrevealing   Acute hypoxic respiratory failure secondary to PNA - requiring 2L [place] O2 support - is normally not on oxygen - titrate to RA as able - ambulatory sat screen  L sided chest pain - Suspect this is related to pneumonia - no PE on CTangio - troponin not signif elevated   CAD - Continue aspirin per home medical regimen  COPD GOLD II  - No wheezing on  exam, continue to provide broncho-dilators scheduled and as needed  HLD  - Continue statin as per home medical regimen  Acute hyponatremia - pre renal - Resolved with volume resuscitation  Hypokalemia  -resolved   Essential hypertension -BP currently well controlled    S/P Aortic valve replacement - Continue aspirin as per home medical regimen  GERD  - Continue PPI   Normocytic anemia  -Fe studies suggestive of Fe deficiency but bordering on ACD as well - check stool guiac - would benefit from outpt colonoscopy    Code Status: FULL Family Communication: no family present at time of exam Disposition Plan: begin to ambulate - wean oxygen as able - probable discharge 24-48 hours  Consultants: none  Procedures: none  Antibiotics: levaquin 9/27 > vanc 9/27   DVT prophylaxis: lovenox  Objective: Blood pressure 114/67, pulse 74, temperature 98.4 F (36.9 C), temperature source Oral, resp. rate 15, height 5' 2" (1.575 m), weight 70 kg (154 lb 5.2 oz), SpO2 100 %.  Intake/Output Summary (Last 24 hours) at 06/03/15 1709 Last data filed at 06/03/15 1500  Gross per 24 hour  Intake   1580 ml  Output   1300 ml  Net    280 ml   Exam: General: No acute respiratory distress - alert  Lungs: Clear to auscultation bilaterally without wheezes - L basilar crackles Cardiovascular: Regular rate and rhythm without murmur gallop or rub  Abdomen: Nontender, nondistended, soft, bowel sounds positive, no rebound, no ascites, no appreciable mass Extremities: No significant cyanosis, clubbing, edema bilateral lower extremities  Data Reviewed: Basic Metabolic Panel:  Recent Labs Lab 05/31/15 0458   05/31/15 0504 05/31/15 1112 06/01/15 0307 06/02/15 0224 06/03/15 0223  NA 134* 135  --  134* 136 137  K 3.8 3.9  --  3.2* 3.9 4.0  CL 98* 97*  --  103 103 103  CO2  --  27  --  _0 GLUCOSE 102* 102*  --  136* 112* 114*  BUN 15 13  --  5* <5* <5*  CREATININE 0.50 0.54 0.53  0.52 0.45 0.46  CALCIUM  --  9.0  --  7.8* 8.2* 8.5*  MG  --   --   --   --   --  1.7    CBC:  Recent Labs Lab 05/31/15 0504 05/31/15 1112 06/01/15 0307 06/02/15 0224 06/03/15 0223  WBC 11.6* 10.7* 9.3 5.9 5.1  NEUTROABS  --   --   --   --  4.0  HGB 12.3 9.9* 9.6* 9.3* 9.3*  HCT 38.1 30.9* 29.6* 29.7* 28.6*  MCV 92.0 92.8 92.5 92.0 91.7  PLT 196 154 134* 148* 159    Liver Function Tests:  Recent Labs Lab 06/02/15 0224 06/03/15 0223  AST 30 46*  ALT 30 46  ALKPHOS 44 47  BILITOT 0.7 0.3  PROT 5.2* 5.6*  ALBUMIN 2.8* 2.8*    Coags:  Recent Labs Lab 05/31/15 1112  INR 1.18    Recent Labs Lab 05/31/15 1112  APTT 31    Cardiac Enzymes:  Recent Labs Lab 05/31/15 1112 05/31/15 1543 05/31/15 2215  TROPONINI <0.03 0.04* <0.03    Recent Results (from the past 240 hour(s))  Culture, blood (routine x 2)     Status: None (Preliminary result)   Collection Time: 05/31/15  4:48 AM  Result Value Ref Range Status   Specimen Description BLOOD RIGHT ANTECUBITAL  Final   Special Requests BOTTLES DRAWN AEROBIC AND ANAEROBIC 10CC  Final   Culture NO GROWTH 3 DAYS  Final   Report Status PENDING  Incomplete  Culture, blood (routine x 2)     Status: None (Preliminary result)   Collection Time: 05/31/15  8:09 AM  Result Value Ref Range Status   Specimen Description BLOOD LEFT HAND  Final   Special Requests BOTTLES DRAWN AEROBIC AND ANAEROBIC 4CC  Final   Culture NO GROWTH 3 DAYS  Final   Report Status PENDING  Incomplete  MRSA PCR Screening     Status: None   Collection Time: 05/31/15 10:40 AM  Result Value Ref Range Status   MRSA by PCR NEGATIVE NEGATIVE Final    Comment:        The GeneXpert MRSA Assay (FDA approved for NASAL specimens only), is one component of a comprehensive MRSA colonization surveillance program. It is not intended to diagnose MRSA infection nor to guide or monitor treatment for MRSA infections.      Studies:   Recent x-ray  studies have been reviewed in detail by the Attending Physician  Scheduled Meds:  Scheduled Meds: . antiseptic oral rinse  7 mL Mouth Rinse BID  . aspirin EC  81 mg Oral Daily  . budesonide-formoterol  2 puff Inhalation BID  . enoxaparin (LOVENOX) injection  40 mg Subcutaneous Q24H  . guaiFENesin-dextromethorphan  5 mL Oral BID  . levofloxacin (LEVAQUIN) IV  750 mg Intravenous Q24H  . montelukast  10 mg Oral QHS  . pantoprazole  40 mg Oral Daily  . rosuvastatin  40 mg Oral q1800  . sertraline  50 mg Oral Daily  . tiotropium  18 mcg  Inhalation Daily  . venlafaxine XR  150 mg Oral Q breakfast    Time spent on care of this patient: 35 mins   MCCLUNG,JEFFREY T , MD   [facility] Office  [phone] Pager - Text Page per Amion as per below:  On-Call/Text Page:      [email or web address]      password [email or web address]  If 7PM-7AM, please contact night-coverage [email or web address] Password [email or web address] 06/03/2015, 5:09 PM   LOS: 3 days       

## 2015-06-03 NOTE — Progress Notes (Signed)
O2 sats in the 84% on room air, sleeping, RN woke her up and applied 2L O2 and O2 came up to 95%. RN left room for 5 minutes and upon returning to room pt was moving arms and legs uncontrollably, writhing in bed, complaining of shortness of breath, tachycardic at 140bpm, O2 beween 77-85%, no complaints of pain. Pt was alert and oriented, able to follow commands. Baltazar Najjar, NP arrived and NRB applied, pt calmed down and O2 sats returned to 99% and HR returned to 90s. Pt resting quietly at this time with NRB in place. Stat portable chest xray ordered per Baltazar Najjar, NP. Will continue to monitor.

## 2015-06-03 NOTE — Progress Notes (Signed)
Shift event: This NP was sitting on the nsg unit when RN came and asked me to see the pt secondary to her flailing in bed. Lorrin Mais was given about 2 hours before event. NP to bedside. Per RN, pt was hypoxic on RA at about 85% and 2L O2 was applied. O2 sats returned to normal. However, when RN returned shortly thereafter to check on pt, her O2 sat was back in the 80s and she was flailing in bed.  S: pt denies chest pain. Endorses SOB. Says she has taken [place] in the past without reaction. Denies hx OSA. O: She appears well, but is moving around in bed continuously. Stating "what is wrong with me". Tachycardic. O2 sat 89-90 on 2L. She is alert and oriented. No focal neuro deficit noted.  A/P: 1. Hypoxia-once NRB applied, pt stopped flailing about and was resting quietly. Stat CXR ordered to f/up PNA. Pt on Levaquin already. Told RN to leave NRB on for an hour or two and if pt continues to saturate well, can wean her O2 back to 55% venti and wean further if she is able to hold her O2 sats and remain calm. As she has COPD, do not want to elevate her PCO2 with long term NRB use. This NP didn't feel ABG necessary at this point since hypoxia was corrected so quickly. However, should she desat again, will obtain one.  No suspicion for PE as pt had a neg CTA chest on admission. Trops have been fine and she has no active chest pain. F/up CXR and continue to monitor.  Clance Boll, NP [facility]

## 2015-06-04 LAB — COMPREHENSIVE METABOLIC PANEL
ALT: 59 U/L — AB (ref 14–54)
AST: 50 U/L — AB (ref 15–41)
Albumin: 2.8 g/dL — ABNORMAL LOW (ref 3.5–5.0)
Alkaline Phosphatase: 44 U/L (ref 38–126)
Anion gap: 6 (ref 5–15)
CHLORIDE: 104 mmol/L (ref 101–111)
CO2: 28 mmol/L (ref 22–32)
CREATININE: 0.49 mg/dL (ref 0.44–1.00)
Calcium: 8.6 mg/dL — ABNORMAL LOW (ref 8.9–10.3)
GFR calc Af Amer: >60 mL/min (ref >60)
GFR calc non Af Amer: >60 mL/min (ref >60)
Glucose, Bld: 113 mg/dL — ABNORMAL HIGH (ref 65–99)
Potassium: 4.1 mmol/L (ref 3.5–5.1)
SODIUM: 138 mmol/L (ref 135–145)
Total Bilirubin: 0.4 mg/dL (ref 0.3–1.2)
Total Protein: 5.2 g/dL — ABNORMAL LOW (ref 6.5–8.1)

## 2015-06-04 LAB — CBC
HCT: 29.4 % — ABNORMAL LOW (ref 36.0–46.0)
Hemoglobin: 9.5 g/dL — ABNORMAL LOW (ref 12.0–15.0)
MCH: 29.9 pg (ref 26.0–34.0)
MCHC: 32.3 g/dL (ref 30.0–36.0)
MCV: 92.5 fL (ref 78.0–100.0)
PLATELETS: 170 10*3/uL (ref 150–400)
RBC: 3.18 MIL/uL — AB (ref 3.87–5.11)
RDW: 14.2 % (ref 11.5–15.5)
WBC: 4.8 10*3/uL (ref 4.0–10.5)

## 2015-06-04 MED ORDER — PROPRANOLOL HCL 20 MG PO TABS
20.0000 mg | ORAL_TABLET | Freq: Two times a day (BID) | ORAL | Status: DC
  Filled 2015-06-04 (×2): qty 1

## 2015-06-04 MED ORDER — TRAMADOL HCL 50 MG PO TABS
50.0000 mg | ORAL_TABLET | Freq: Four times a day (QID) | ORAL | Status: DC | PRN
Start: 2015-06-04 — End: 2015-06-04

## 2015-06-04 MED ORDER — LEVOFLOXACIN 750 MG PO TABS: 750.0000 mg | ORAL_TABLET | Freq: Every day | ORAL | Status: DC

## 2015-06-04 MED ORDER — LORAZEPAM 0.5 MG PO TABS
0.5000 mg | ORAL_TABLET | Freq: Once | ORAL | Status: AC
  Administered 2015-06-04: 0.5 mg via ORAL
  Filled 2015-06-04: qty 1

## 2015-06-04 MED ORDER — ROSUVASTATIN CALCIUM 40 MG PO TABS: 40.0000 mg | ORAL_TABLET | Freq: Every day | ORAL | Status: DC

## 2015-06-04 MED ORDER — PROPRANOLOL HCL 40 MG PO TABS: 40.0000 mg | ORAL_TABLET | Freq: Two times a day (BID) | ORAL | Status: DC

## 2015-06-04 MED ORDER — PROPRANOLOL HCL 20 MG PO TABS: 20.0000 mg | ORAL_TABLET | Freq: Two times a day (BID) | ORAL | Status: DC

## 2015-06-04 NOTE — Discharge Summary (Signed)
DISCHARGE SUMMARY  Katherine Foley  MR#: [id number]  DOB:12-Nov-1950  Date of Admission: 05/31/2015 Date of Discharge: 06/04/2015  Attending Physician:Marilynne Dupuis T  Patient's FSE:LTRVUYE B [name], FNP  Consults:  none  Disposition: D/C home   Follow-up Appts:     Follow-up Information    Follow up with Kennyth Arnold, FNP In 1 week.   Specialty:  Family Medicine   Contact information:   [place] [address] [phone]      Tests Needing Follow-up: -recheck of transaminases is suggested in 7-10 days   Discharge Diagnoses: Sepsis due to community acquired pneumonia Acute hypoxic respiratory failure secondary to PNA L sided chest pain CAD COPD GOLD II  HLD  Acute hyponatremia Hypokalemia  Essential hypertension S/P Aortic valve replacement GERD  Normocytic anemia   Initial presentation: 63 yo female with known chronic diastolic CHF grade 1, hypertension, CAD, status post AoV replacement, hyperlipidemia, and COPD who presented to [facility] emergency department with several days duration of persistent left-sided chest pain, was initially intermittent but then constant, 7/10 in severity, occasionally but not consistently radiating to entire chest area, worse with deep breathing, associated with nonproductive cough, dyspnea with exertion, malaise and poor oral intake, subjective fevers and chills.   In emergency department, patient noted to be in mild distress. Vital signs notable for T 100.6 F, respiratory rate 23 breaths per minute, blood pressure 88/57, oxygen saturation 93% on room air. Blood work notable for WBC 11.6, sodium 134. Chest x-ray unremarkable, CT chest concerning for developing left lower pneumonia with atelectasis.  Hospital Course:  Sepsis due to community acquired pneumonia - Patient met sepsis criteria with T 100.6, respiratory rate 23, LLL PNA - cont empiric abx to complete 7 day tx course  - lactic acid normal -  initial pro-calcitonin 0.10 - culture data unrevealing - clinically much improved and liberated from O2 at time of d/c   Acute hypoxic respiratory failure secondary to PNA - required 2L [place] O2 support initially - is normally not on oxygen - titrated to RA prior to d/c  L sided chest pain - Suspect this is related to pneumonia - no PE on CTangio - troponin not signif elevated  - much improved prior to d/c   CAD - Continue aspirin per home medical regimen  COPD GOLD II  - No wheezing on exam - stable at time of d/c - f/u w/ Dr. Melvyn Novas as previously scheduled   HLD  - Continue statin as per home medical regimen  Acute hyponatremia - due to simple volume depletion  - resolved with volume resuscitation  Hypokalemia  -resolved   Essential hypertension -BP currently well controlled   S/P Aortic valve replacement - Continue aspirin as per home medical regimen  GERD  - Continue PPI   Normocytic anemia  -Fe studies suggestive of Fe deficiency but bordering on ACD as well - no evidence of gross blood loss - normal colo Nov 2015  Very mild transaminitis -no clear etiology - but values only very mildly elevated  -avoid tylenol for now - hold any potential hepato-toxic meds for 1 week -recheck in f/u w/ PCP    Medication List    STOP taking these medications        bisoprolol 5 MG tablet  Commonly known as:  ZEBETA      TAKE these medications        albuterol 108 (90 BASE) MCG/ACT inhaler  Commonly known as:  PROVENTIL HFA;VENTOLIN  HFA  Inhale 2 puffs into the lungs every 6 (six) hours as needed.     aspirin 81 MG tablet  Take 81 mg by mouth daily.     BIOTIN 5000 PO  Take 1 capsule by mouth daily.     budesonide-formoterol 160-4.5 MCG/ACT inhaler  Commonly known as:  SYMBICORT  Inhale 2 puffs into the lungs 2 (two) times daily.     esomeprazole 40 MG capsule  Commonly known as:  NEXIUM  TAKE ONE CAPSULE BY MOUTH ONCE DAILY     estradiol 0.05 MG/24HR patch    Commonly known as:  VIVELLE-DOT  APPLY ONE PATCH TOPICALLY AND CHANGE TWICE PER WEEK     levofloxacin 750 MG tablet  Commonly known as:  LEVAQUIN  Take 1 tablet (750 mg total) by mouth daily.  Start taking on:  06/05/2015     montelukast 10 MG tablet  Commonly known as:  SINGULAIR  TAKE ONE TABLET BY MOUTH AT BEDTIME     PRISTIQ 100 MG 24 hr tablet  Generic drug:  desvenlafaxine  TAKE ONE TABLET BY MOUTH ONCE DAILY     propranolol 20 MG tablet  Commonly known as:  INDERAL  Take 1 tablet (20 mg total) by mouth 2 (two) times daily.     rosuvastatin 40 MG tablet  Commonly known as:  CRESTOR  Take 1 tablet (40 mg total) by mouth daily.  Start taking on:  06/11/2015     sertraline 50 MG tablet  Commonly known as:  ZOLOFT  TAKE ONE TABLET BY MOUTH ONCE DAILY     SPIRIVA HANDIHALER 18 MCG inhalation capsule  Generic drug:  tiotropium  INHALE ONE DOSE BY MOUTH ONCE DAILY       Day of Discharge BP 118/73 mmHg  Pulse 80  Temp(Src) 97.3 F (36.3 C) (Oral)  Resp 15  Ht $R'5\' 2"'Vh$  (1.575 m)  Wt 69.7 kg (153 lb 10.6 oz)  BMI 28.10 kg/m2  SpO2 94%  Physical Exam: General: No acute respiratory distress Lungs: Clear to auscultation bilaterally without wheezes or crackles Cardiovascular: Regular rate and rhythm without murmur gallop or rub normal S1 and S2 Abdomen: Nontender, nondistended, soft, bowel sounds positive, no rebound, no ascites, no appreciable mass Extremities: No significant cyanosis, clubbing, or edema bilateral lower extremities  Basic Metabolic Panel:  Recent Labs Lab 05/31/15 0504 05/31/15 1112 06/01/15 0307 06/02/15 0224 06/03/15 0223 06/04/15 0424  NA 135  --  134* 136 137 138  K 3.9  --  3.2* 3.9 4.0 4.1  CL 97*  --  103 103 103 104  CO2 27  --  $R'25 25 26 28  'fl$ GLUCOSE 102*  --  136* 112* 114* 113*  BUN 13  --  5* <5* <5* <5*  CREATININE 0.54 0.53 0.52 0.45 0.46 0.49  CALCIUM 9.0  --  7.8* 8.2* 8.5* 8.6*  MG  --   --   --   --  1.7  --     Liver  Function Tests:  Recent Labs Lab 06/02/15 0224 06/03/15 0223 06/04/15 0424  AST 30 46* 50*  ALT 30 46 59*  ALKPHOS 44 47 44  BILITOT 0.7 0.3 0.4  PROT 5.2* 5.6* 5.2*  ALBUMIN 2.8* 2.8* 2.8*    Coags:  Recent Labs Lab 05/31/15 1112  INR 1.18   CBC:  Recent Labs Lab 05/31/15 1112 06/01/15 0307 06/02/15 0224 06/03/15 0223 06/04/15 0424  WBC 10.7* 9.3 5.9 5.1 4.8  NEUTROABS  --   --   --  4.0  --   HGB 9.9* 9.6* 9.3* 9.3* 9.5*  HCT 30.9* 29.6* 29.7* 28.6* 29.4*  MCV 92.8 92.5 92.0 91.7 92.5  PLT 154 134* 148* 159 170    Cardiac Enzymes:  Recent Labs Lab 05/31/15 1112 05/31/15 1543 05/31/15 2215  TROPONINI <0.03 0.04* <0.03   BNP (last 3 results)  Recent Labs  05/31/15 0849  BNP 252.8*     Recent Results (from the past 240 hour(s))  Culture, blood (routine x 2)     Status: None (Preliminary result)   Collection Time: 05/31/15  4:48 AM  Result Value Ref Range Status   Specimen Description BLOOD RIGHT ANTECUBITAL  Final   Special Requests BOTTLES DRAWN AEROBIC AND ANAEROBIC 10CC  Final   Culture NO GROWTH 4 DAYS  Final   Report Status PENDING  Incomplete  Culture, blood (routine x 2)     Status: None (Preliminary result)   Collection Time: 05/31/15  8:09 AM  Result Value Ref Range Status   Specimen Description BLOOD LEFT HAND  Final   Special Requests BOTTLES DRAWN AEROBIC AND ANAEROBIC 4CC  Final   Culture NO GROWTH 4 DAYS  Final   Report Status PENDING  Incomplete  MRSA PCR Screening     Status: None   Collection Time: 05/31/15 10:40 AM  Result Value Ref Range Status   MRSA by PCR NEGATIVE NEGATIVE Final    Comment:        The GeneXpert MRSA Assay (FDA approved for NASAL specimens only), is one component of a comprehensive MRSA colonization surveillance program. It is not intended to diagnose MRSA infection nor to guide or monitor treatment for MRSA infections.       Time spent in discharge (includes decision making & examination of  pt): >30 minutes  06/04/2015, 3:37 PM   Cherene Altes, MD [facility] Office  [phone] Pager [phone]  On-Call/Text Page:      [email or web address]      password [email or web address]

## 2015-06-04 NOTE — Discharge Instructions (Signed)
To Whom it may concern,  [name] has been under my care at [facility] from 05/31/2015 through 06/04/2015.  She is advised to refrain from work until 06/09/2015 when she will be cleared to resume work.  Please feel free to contact my office with questions.  Cherene Altes, MD [facility] Office  [phone]      DO NOT TAKE TYLENOL until further instructed by your primary care doctor.  Your illness mildly aggravated the liver and the ongoing use of tylenol (acetaminophen) products at this time could further aggravate the liver.  Likewise, you are instructed to not take you cholesterol medicine for one week.  See your pimary care doctor for a recheck of your general condition and your liver function in one week.     Pneumonia   Pneumonia is an infection of the lungs.  CAUSES Pneumonia may be caused by bacteria or a virus. Usually, these infections are caused by breathing infectious particles into the lungs (respiratory tract). SIGNS AND SYMPTOMS   Cough.  Fever.  Chest pain.  Increased rate of breathing.  Wheezing.  Mucus production. DIAGNOSIS  If you have the common symptoms of pneumonia, your health care provider will typically confirm the diagnosis with a chest X-ray. The X-ray will show an abnormality in the lung (pulmonary infiltrate) if you have pneumonia. Other tests of your blood, urine, or sputum may be done to find the specific cause of your pneumonia. Your health care provider may also do tests (blood gases or pulse oximetry) to see how well your lungs are working. TREATMENT  Some forms of pneumonia may be spread to other people when you cough or sneeze. You may be asked to wear a mask before and during your exam. Pneumonia that is caused by bacteria is treated with antibiotic medicine. Pneumonia that is caused by the influenza virus may be treated with an antiviral medicine. Most other viral infections must run their course. These infections  will not respond to antibiotics.  HOME CARE INSTRUCTIONS   Cough suppressants may be used if you are losing too much rest. However, coughing protects you by clearing your lungs. You should avoid using cough suppressants if you can.  Your health care provider may have prescribed medicine if he or she thinks your pneumonia is caused by bacteria or influenza. Finish your medicine even if you start to feel better.  Your health care provider may also prescribe an expectorant. This loosens the mucus to be coughed up.  Take medicines only as directed by your health care provider.  Do not smoke. Smoking is a common cause of bronchitis and can contribute to pneumonia. If you are a smoker and continue to smoke, your cough may last several weeks after your pneumonia has cleared.  A cold steam vaporizer or humidifier in your room or home may help loosen mucus.  Coughing is often worse at night. Sleeping in a semi-upright position in a recliner or using a couple pillows under your head will help with this.  Get rest as you feel it is needed. Your body will usually let you know when you need to rest. PREVENTION A pneumococcal shot (vaccine) is available to prevent a common bacterial cause of pneumonia. This is usually suggested for:  People over 63 years old.  Patients on chemotherapy.  People with chronic lung problems, such as bronchitis or emphysema.  People with immune system problems. If you are over 65 or have a high risk condition, you may  receive the pneumococcal vaccine if you have not received it before. In some countries, a routine influenza vaccine is also recommended. This vaccine can help prevent some cases of pneumonia.You may be offered the influenza vaccine as part of your care. If you smoke, it is time to quit. You may receive instructions on how to stop smoking. Your health care provider can provide medicines and counseling to help you quit. SEEK MEDICAL CARE IF: You have a  fever. SEEK IMMEDIATE MEDICAL CARE IF:   Your illness becomes worse. This is especially true if you are elderly or weakened from any other disease.  You cannot control your cough with suppressants and are losing sleep.  You begin coughing up blood.  You develop pain which is getting worse or is uncontrolled with medicines.  Any of the symptoms which initially brought you in for treatment are getting worse rather than better.  You develop shortness of breath or chest pain. MAKE SURE YOU:   Understand these instructions.  Will watch your condition.  Will get help right away if you are not doing well or get worse. Document Released: 08/20/2005 Document Revised: 01/04/2014 Document Reviewed: 11/09/2010 [facility] Patient Information 2015 [place], [place]. This information is not intended to replace advice given to you by your health care provider. Make sure you discuss any questions you have with your health care provider.

## 2015-06-04 NOTE — Progress Notes (Signed)
D/c instructions reviewed with pt and family. Copy of instructions, scripts and note for her employer given to pt. Pt d/c'd via wheelchair with belongings with family, escorted by unit NT.

## 2015-06-04 NOTE — Progress Notes (Signed)
06/04/2015  To Whom it may concern,  [name] has been under my care at [facility] from 05/31/2015 through 06/04/2015.  She is advised to refrain from work until 06/09/2015 when she will be cleared to resume work.  Please feel free to contact my office with questions.  Cherene Altes, MD [facility] Office  [phone]

## 2015-06-04 NOTE — Evaluation (Signed)
Physical Therapy Evaluation/ Discharge Patient Details Name: Katherine Foley MRN: [id number] DOB: 02-23-1951 Today's Date: 06/04/2015   History of Present Illness  64 yo female with known chronic diastolic CHF grade 1, hypertension, CAD, status post AV replacement, hyperlipidemia, COPD, presents to [facility] emergency department with main concern of several days duration of persistent left-sided chest pain. Pt with PNA and sepsis  Clinical Impression  Katherine Foley is very pleasant, moving well, no CP or SOB with activity. She is able to maintain sats 90-93% on RA with decreased gait speed. She completed all transfers, toileting and pericare as well as standing grooming without assist or  LOB. Pt at baseline functional level and encouraged to walk daily while in hospital. No further therapy needs at this time with pt aware and agreeable.     Follow Up Recommendations No PT follow up    Equipment Recommendations  None recommended by PT    Recommendations for Other Services       Precautions / Restrictions Precautions Precautions: None      Mobility  Bed Mobility Overal bed mobility: Modified Independent                Transfers Overall transfer level: Modified independent                  Ambulation/Gait Ambulation/Gait assistance: Independent Ambulation Distance (Feet): 500 Feet Assistive device: None Gait Pattern/deviations: WFL(Within Functional Limits)     General Gait Details: no difficulty with change in speed or direction. Sats 90-93% on RA with decreased pace  Stairs Stairs: Yes Stairs assistance: Modified independent (Device/Increase time) Stair Management: One rail Right;Alternating pattern;Forwards Number of Stairs: 8    Wheelchair Mobility    Modified Rankin (Stroke Patients Only)       Balance Overall balance assessment: No apparent balance deficits (not formally assessed)                                            Pertinent Vitals/Pain Pain Assessment: No/denies pain    Home Living Family/patient expects to be discharged to:: Private residence Living Arrangements: Spouse/significant other Available Help at Discharge: Family;Available 24 hours/day Type of Home: House   Entrance Stairs-Rails: Right Entrance Stairs-Number of Steps: 2 Home Layout: Two level Home Equipment: None      Prior Function Level of Independence: Independent               Hand Dominance        Extremity/Trunk Assessment   Upper Extremity Assessment: Overall WFL for tasks assessed           Lower Extremity Assessment: Overall WFL for tasks assessed      Cervical / Trunk Assessment: Normal  Communication   Communication: No difficulties  Cognition Arousal/Alertness: Awake/alert Behavior During Therapy: WFL for tasks assessed/performed Overall Cognitive Status: Within Functional Limits for tasks assessed                      General Comments      Exercises        Assessment/Plan    PT Assessment Patent does not need any further PT services  PT Diagnosis Other (comment) (Asked to evaluate pt with PNA)   PT Problem List    PT Treatment Interventions     PT Goals (Current goals can be found in the Care Plan section)  Acute Rehab PT Goals PT Goal Formulation: All assessment and education complete, DC therapy    Frequency     Barriers to discharge        Co-evaluation               End of Session   Activity Tolerance: Patient tolerated treatment well Patient left: in chair;with call bell/phone within reach Nurse Communication: Mobility status         Time: 0743-0800 PT Time Calculation (min) (ACUTE ONLY): 17 min   Charges:   PT Evaluation $Initial PT Evaluation Tier I: 1 Procedure     PT G CodesMelford Aase 06/04/2015, 9:48 AM Elwyn Reach, [place]

## 2015-06-05 LAB — CULTURE, BLOOD (ROUTINE X 2)
Culture: NO GROWTH
Culture: NO GROWTH

## 2015-06-15 ENCOUNTER — Other Ambulatory Visit: Payer: Self-pay | Admitting: Family

## 2015-06-16 ENCOUNTER — Telehealth: Payer: Self-pay | Admitting: Family

## 2015-06-16 NOTE — Telephone Encounter (Signed)
Appointment scheduled for tomorrow with Dr. Regis Bill

## 2015-06-16 NOTE — Telephone Encounter (Signed)
[facility] Patient Name: Katherine Foley DOB: April 22, 1951 Initial Comment Caller states in hospital w/pneumonia-d/c 10/02; somewhat better but not getting strength back; BP meds were also changed when in hospital; Nurse Assessment Nurse: Markus Daft, RN, Sherre Poot Date/Time (Eastern Time): 06/16/2015 12:09:04 PM Confirm and document reason for call. If symptomatic, describe symptoms. ---1) Caller states in hospital w/ pneumonia and discharged 06/04/2015. She feels somewhat better but not getting strength back. RTW on 06/10/15. 2) BP meds were also changed when in hospital. BP is a little higher than it has been running. Not checked this AM. --RN advised to call back if s/s or BP > 160/90. Has the patient traveled out of the country within the last 30 days? ---Not Applicable Does the patient have any new or worsening symptoms? ---Yes Will a triage be completed? ---Yes Related visit to physician within the last 2 weeks? ---Yes Does the PT have any chronic conditions? (i.e. diabetes, asthma, etc.) ---Yes List chronic conditions. ---CABG 4 years ago; liver irritated - stopped Crestor; HTN; CAD Guidelines Guideline Title Affirmed Question Affirmed Notes Pneumonia on Antibiotic Post- Hospitalization Follow-up Call [1] Finished taking antibiotics AND [2] breathing not back to normal (e.g., "breathing still worse than normal") --> some sob with walking only; dry cough Final Disposition User See PCP When Office is Open (within 3 days) Markus Daft, Therapist, sports, Sherre Poot Comments Appt made with Dr. Shanon Ace as Dr. Dutch Quint is not on the schedule tomorrow, and no appts available today. Disagree/Comply: Comply

## 2015-06-17 ENCOUNTER — Encounter: Payer: Self-pay | Admitting: Internal Medicine

## 2015-06-17 ENCOUNTER — Ambulatory Visit (INDEPENDENT_AMBULATORY_CARE_PROVIDER_SITE_OTHER): Payer: BLUE CROSS/BLUE SHIELD | Admitting: Internal Medicine

## 2015-06-17 ENCOUNTER — Telehealth: Payer: Self-pay | Admitting: Family

## 2015-06-17 VITALS — BP 156/94 | HR 63 | Temp 97.8°F | Ht 62.0 in | Wt 144.9 lb

## 2015-06-17 DIAGNOSIS — Z954 Presence of other heart-valve replacement: Secondary | ICD-10-CM | POA: Diagnosis not present

## 2015-06-17 DIAGNOSIS — R7989 Other specified abnormal findings of blood chemistry: Secondary | ICD-10-CM

## 2015-06-17 DIAGNOSIS — I10 Essential (primary) hypertension: Secondary | ICD-10-CM

## 2015-06-17 DIAGNOSIS — Z952 Presence of prosthetic heart valve: Secondary | ICD-10-CM

## 2015-06-17 DIAGNOSIS — J449 Chronic obstructive pulmonary disease, unspecified: Secondary | ICD-10-CM | POA: Diagnosis not present

## 2015-06-17 DIAGNOSIS — D509 Iron deficiency anemia, unspecified: Secondary | ICD-10-CM

## 2015-06-17 DIAGNOSIS — R945 Abnormal results of liver function studies: Secondary | ICD-10-CM

## 2015-06-17 DIAGNOSIS — Z8701 Personal history of pneumonia (recurrent): Secondary | ICD-10-CM

## 2015-06-17 DIAGNOSIS — I5032 Chronic diastolic (congestive) heart failure: Secondary | ICD-10-CM

## 2015-06-17 DIAGNOSIS — E8809 Other disorders of plasma-protein metabolism, not elsewhere classified: Secondary | ICD-10-CM | POA: Diagnosis not present

## 2015-06-17 DIAGNOSIS — R5383 Other fatigue: Secondary | ICD-10-CM

## 2015-06-17 DIAGNOSIS — Z09 Encounter for follow-up examination after completed treatment for conditions other than malignant neoplasm: Secondary | ICD-10-CM

## 2015-06-17 LAB — CBC WITH DIFFERENTIAL/PLATELET
BASOS PCT: 0.4 % (ref 0.0–3.0)
Basophils Absolute: 0 10*3/uL (ref 0.0–0.1)
EOS PCT: 0.1 % (ref 0.0–5.0)
Eosinophils Absolute: 0 10*3/uL (ref 0.0–0.7)
HEMATOCRIT: 36 % (ref 36.0–46.0)
HEMOGLOBIN: 11.6 g/dL — AB (ref 12.0–15.0)
LYMPHS PCT: 19.8 % (ref 12.0–46.0)
Lymphs Abs: 1.2 10*3/uL (ref 0.7–4.0)
MCHC: 32.2 g/dL (ref 30.0–36.0)
MCV: 90 fl (ref 78.0–100.0)
MONO ABS: 0.7 10*3/uL (ref 0.1–1.0)
MONOS PCT: 10.6 % (ref 3.0–12.0)
Neutro Abs: 4.3 10*3/uL (ref 1.4–7.7)
Neutrophils Relative %: 69.1 % (ref 43.0–77.0)
Platelets: 278 10*3/uL (ref 150.0–400.0)
RBC: 4.01 Mil/uL (ref 3.87–5.11)
RDW: 14.8 % (ref 11.5–15.5)
WBC: 6.2 10*3/uL (ref 4.0–10.5)

## 2015-06-17 LAB — BASIC METABOLIC PANEL
BUN: 17 mg/dL (ref 6–23)
CALCIUM: 9 mg/dL (ref 8.4–10.5)
CO2: 28 mEq/L (ref 19–32)
Chloride: 98 mEq/L (ref 96–112)
Creatinine, Ser: 0.49 mg/dL (ref 0.40–1.20)
GFR: 134.22 mL/min (ref >60.00)
Glucose, Bld: 97 mg/dL (ref 70–99)
POTASSIUM: 4.5 meq/L (ref 3.5–5.1)
SODIUM: 134 meq/L — AB (ref 135–145)

## 2015-06-17 LAB — HEPATIC FUNCTION PANEL
ALBUMIN: 3.7 g/dL (ref 3.5–5.2)
ALT: 30 U/L (ref 0–35)
AST: 19 U/L (ref 0–37)
Alkaline Phosphatase: 60 U/L (ref 39–117)
Bilirubin, Direct: 0 mg/dL (ref 0.0–0.3)
TOTAL PROTEIN: 6.4 g/dL (ref 6.0–8.3)
Total Bilirubin: 0.3 mg/dL (ref 0.2–1.2)

## 2015-06-17 LAB — SEDIMENTATION RATE: SED RATE: 43 mm/h — AB (ref 0–22)

## 2015-06-17 NOTE — Patient Instructions (Signed)
You were anemic in hospital and could cause more fatigue  So checking this . Get a fu chest x ray also  We may get  Dr Melvyn Novas or pulmonary to see you in follow up depending on  Results and how you are doing .  Iron Deficiency Anemia, Adult Anemia is a condition in which there are less red blood cells or hemoglobin in the blood than normal. Hemoglobin is the part of red blood cells that carries oxygen. Iron deficiency anemia is anemia caused by too little iron. It is the most common type of anemia. It may leave you tired and short of breath. CAUSES   Lack of iron in the diet.  Poor absorption of iron, as seen with intestinal disorders.  Intestinal bleeding.  Heavy periods. SIGNS AND SYMPTOMS  Mild anemia may not be noticeable. Symptoms may include:  Fatigue.  Headache.  Pale skin.  Weakness.  Tiredness.  Shortness of breath.  Dizziness.  Cold hands and feet.  Fast or irregular heartbeat. DIAGNOSIS  Diagnosis requires a thorough evaluation and physical exam by your health care provider. Blood tests are generally used to confirm iron deficiency anemia. Additional tests may be done to find the underlying cause of your anemia. These may include:  Testing for blood in the stool (fecal occult blood test).  A procedure to see inside the colon and rectum (colonoscopy).  A procedure to see inside the esophagus and stomach (endoscopy). TREATMENT  Iron deficiency anemia is treated by correcting the cause of the deficiency. Treatment may involve:  Adding iron-rich foods to your diet.  Taking iron supplements. Pregnant or breastfeeding women need to take extra iron because their normal diet usually does not provide the required amount.  Taking vitamins. Vitamin C improves the absorption of iron. Your health care provider may recommend that you take your iron tablets with a glass of orange juice or vitamin C supplement.  Medicines to make heavy menstrual flow  lighter.  Surgery. HOME CARE INSTRUCTIONS   Take iron as directed by your health care provider.  If you cannot tolerate taking iron supplements by mouth, talk to your health care provider about taking them through a vein (intravenously) or an injection into a muscle.  For the best iron absorption, iron supplements should be taken on an empty stomach. If you cannot tolerate them on an empty stomach, you may need to take them with food.  Do not drink milk or take antacids at the same time as your iron supplements. Milk and antacids may interfere with the absorption of iron.  Iron supplements can cause constipation. Make sure to include fiber in your diet to prevent constipation. A stool softener may also be recommended.  Take vitamins as directed by your health care provider.  Eat a diet rich in iron. Foods high in iron include liver, lean beef, whole-grain bread, eggs, dried fruit, and dark green leafy vegetables. SEEK IMMEDIATE MEDICAL CARE IF:   You faint. If this happens, do not drive. Call your local emergency services (911 in [place] if no other help is available.  You have chest pain.  You feel nauseous or vomit.  You have severe or increased shortness of breath with activity.  You feel weak.  You have a rapid heartbeat.  You have unexplained sweating.  You become light-headed when getting up from a chair or bed. MAKE SURE YOU:   Understand these instructions.  Will watch your condition.  Will get help right away if you are not  doing well or get worse.   This information is not intended to replace advice given to you by your health care provider. Make sure you discuss any questions you have with your health care provider.   Document Released: 08/17/2000 Document Revised: 09/10/2014 Document Reviewed: 04/27/2013 Elsevier Interactive Patient Education [employer].

## 2015-06-17 NOTE — Progress Notes (Signed)
Pre visit review using our clinic review tool, if applicable. No additional management support is needed unless otherwise documented below in the visit note.  Chief Complaint  Patient presents with  . Follow-up    HPI:  Patient Katherine Foley  comes in today for SDA for  new problem evaluation. She is here for post hopitalization visit and pcp NA .  hospt for fever resp clinincal pna and sepsis but neg cx a has underlying copd stage 2 followed but dr Melvyn Novas in pulmonary and hx of avr and native cad  hwo come in because she is still just tired  And winded and sleepy  " Hasn't  bounced back like she thought she would "   since dc on 10 1 2016. 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  Sleep  Ok   Inhalers   psririva  And   Albuterol .  No change Dr Melvyn Novas   Sleepiness  Exhaasuttion.  Continues   Liver irritated.  So stopped  crestor   Still aches and has headaches.   Changed bp med in hospital . From bisoprolol to inderal cause of tremores now better    Baseline energy and resp    Per hosp  Fine.     Is vegetarian no meat. No bleeding .  No gi bleed utd on colon screen per pat report  ROS: See pertinent positives and negatives per HPI. No cp hemoptysis  chagne in bowel habits uti sx hematuria Sees dr Burt Knack 1-2 x per year . Cards   Past Medical History  Diagnosis Date  . AORTIC STENOSIS 07/07/2010    a. echo 12/23/10: EF 60-65%, mod to severe AS with mean gradient 29 mmHg;  b. 04/2011 s/p bioprosthetic AVR.  Marland Kitchen Barrett's esophagus 04/14/2007  . CAD, NATIVE VESSEL 05/17/2010    a. NSTEMI 8/11 - BMS to RCA;  b. cath 4/12: dLAD 50%, RI 80% (PCI), AVCFX 30%, pRCA 30%, stent ok, 40-50, then 50% dist RCA;  c. s/p Promus DES to RI 12/25/10;  d. 04/2011 CABG x 1 (LIMA->LAD) @ time of AVR  . Carotid stenosis 05/17/2010    a. dopplers 38/46: RICA  65-99%; LICA 3-57% (follow up due 11/12)  . 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 Herpes simplex without mention of complication 0/17/7939  . [place], MILD 08/29/2009  . OSTEOPENIA 04/14/2007  . URINARY INCONTINENCE, STRESS, MILD 08/26/2007  . GERD (gastroesophageal reflux disease)   . Menopause   . Ovarian cyst   . Urine, incontinence, stress female   . Adenomatous colon polyp   . HYPERTENSION 04/14/2007  . Heart murmur   . Anginal pain ([place])   . Myocardial infarction [facility]) 2011    "during catherization" (03/25/2013)  . Hemorrhoid thrombosis     "had it lanced" (03/25/2013)  . Pneumonia     "multiple times" (03/25/2013)  . Chronic bronchitis ([place])     "multiple times" (03/25/2013)  . Exertional shortness of breath     "related to COPD problems" (03/25/2013)  . History of blood transfusion     'w/OHS" (03/25/2013)  . H/O hiatal hernia   . MIGRAINE HEADACHE 04/20/2008  . Migraine   . ANXIETY DEPRESSION 02/15/2009  . Asthma   . Barrett's  esophagus 2000  . Colon polyp 2004  . COPD (chronic obstructive pulmonary disease) (HCC)     Family History  Problem Relation Age of Onset  . Coronary artery disease Other   . Heart disease Other     6 stents  . Heart disease Mother   . COPD Father     smoked  . Heart disease Father   . Heart disease Maternal Uncle   . Breast cancer Daughter   . Colon cancer Neg Hx   . Colon polyps Neg Hx   . Asthma Son     as a child   . Rheum arthritis Daughter   . Rheum arthritis Father   . Breast cancer Daughter     Social History   Social History  . Marital Status: Single    Spouse Name: N/A  . Number of Children: 3  . Years of Education: N/A   Occupational History  . [place] History Main Topics  . Smoking status: Former Smoker -- 1.50 packs/day for 43 years    Types: Cigarettes    Quit date: 09/03/2005  . Smokeless tobacco: Never Used  . Alcohol Use: Yes  . Drug Use: No  . Sexual Activity:  Not Asked   Other Topics Concern  . None   Social History Narrative    Outpatient Prescriptions Prior to Visit  Medication Sig Dispense Refill  . albuterol (PROVENTIL HFA;VENTOLIN HFA) 108 (90 BASE) MCG/ACT inhaler Inhale 2 puffs into the lungs every 6 (six) hours as needed. 1 Inhaler 1  . aspirin 81 MG tablet Take 81 mg by mouth daily.      Marland Kitchen BIOTIN 5000 PO Take 1 capsule by mouth daily.    . budesonide-formoterol (SYMBICORT) 160-4.5 MCG/ACT inhaler Inhale 2 puffs into the lungs 2 (two) times daily. 1 Inhaler 5  . esomeprazole (NEXIUM) 40 MG capsule TAKE ONE CAPSULE BY MOUTH ONCE DAILY 90 capsule 3  . estradiol (VIVELLE-DOT) 0.05 MG/24HR patch APPLY ONE PATCH TOPICALLY AND CHANGE TWICE PER WEEK 10 patch 0  . montelukast (SINGULAIR) 10 MG tablet TAKE ONE TABLET BY MOUTH AT BEDTIME 30 tablet 0  . PRISTIQ 100 MG 24 hr tablet TAKE ONE TABLET BY MOUTH ONCE DAILY 30 tablet 0  . propranolol (INDERAL) 20 MG tablet Take 1 tablet (20 mg total) by mouth 2 (two) times daily. 60 tablet 0  . sertraline (ZOLOFT) 50 MG tablet TAKE ONE TABLET BY MOUTH ONCE DAILY 30 tablet 0  . SPIRIVA HANDIHALER 18 MCG inhalation capsule INHALE ONE DOSE BY MOUTH ONCE DAILY 30 capsule 6  . levofloxacin (LEVAQUIN) 750 MG tablet Take 1 tablet (750 mg total) by mouth daily. 2 tablet 0  . rosuvastatin (CRESTOR) 40 MG tablet Take 1 tablet (40 mg total) by mouth daily. 30 tablet 6   No facility-administered medications prior to visit.     EXAM:  BP 156/94 mmHg  Pulse 63  Temp(Src) 97.8 F (36.6 C) (Oral)  Ht 5\' 2"  (1.575 m)  Wt 144 lb 14.4 oz (65.726 kg)  BMI 26.50 kg/m2  SpO2 97%  Body mass index is 26.5 kg/(m^2).  GENERAL: vitals reviewed and listed above, alert, oriented, appears well hydrated and in no acute distress tired no toxic  HEENT: atraumatic, conjunctiva  clear, no obvious abnormalities on inspection of external nose and ears OP : no lesion edema or exudate  NECK: no obvious masses on inspection  palpation  LUNGS: clear to auscultation bilaterally, no wheezes, rales or rhonchi,  Few crackles left base? No raskes  No active wheeze CV: HRRR,  1?6 sem usp no radtiation no clubbing cyanosis or  peripheral edema nl cap refill  Abdomen:  Sof,t normal bowel sounds without hepatosplenomegaly, no guarding rebound or masses no CVA tenderness MS: moves all extremities without noticeable focal  Abnormality Neuro non focal no tremor  Skin nl cap refill no bruising PSYCH: pleasant and cooperative, no obvious depression or anxiety Lab Results  Component Value Date   WBC 6.2 06/17/2015   HGB 11.6* 06/17/2015   HCT 36.0 06/17/2015   PLT 278.0 06/17/2015   GLUCOSE 97 06/17/2015   CHOL 157 12/09/2014   TRIG 74.0 12/09/2014   HDL 65.00 12/09/2014   LDLDIRECT 73.3 10/02/2010   LDLCALC 77 12/09/2014   ALT 30 06/17/2015   AST 19 06/17/2015   NA 134* 06/17/2015   K 4.5 06/17/2015   CL 98 06/17/2015   CREATININE 0.49 06/17/2015   BUN 17 06/17/2015   CO2 28 06/17/2015   TSH 0.357 05/31/2015   INR 1.18 05/31/2015   HGBA1C 6.2* 04/27/2011    ASSESSMENT AND PLAN:  Discussed the following assessment and plan:  Other fatigue - Plan: CBC with Differential/Platelet, Hepatic function panel, Basic metabolic panel, Sedimentation rate, POCT urinalysis dipstick, Protein / creatinine ratio, urine, DG Chest 2 View  COPD GOLD II  - Plan: DG Chest 2 View  S/P AVR (aortic valve replacement)  Chronic diastolic CHF (congestive heart failure) (HCC)  Anemia, iron deficiency - noted  and documented in hospital  nobleeding is a vegetarian - Plan: CBC with Differential/Platelet, Hepatic function panel, Basic metabolic panel, Sedimentation rate, POCT urinalysis dipstick, Protein / creatinine ratio, urine  Abnormal LFTs - noted in sp  - Plan: CBC with Differential/Platelet, Hepatic function panel, Basic metabolic panel, Sedimentation rate, POCT urinalysis dipstick, Protein / creatinine ratio, urine  Essential  hypertension - med change in hosp  bisoprolol to inderal  helped tremor  uncertain if  cuasing se  with her copd.  Hypoalbuminemia - noted in hosp could be from illness  uirne  screen neg  - Plan: CBC with Differential/Platelet, Hepatic function panel, Basic metabolic panel, Sedimentation rate, POCT urinalysis dipstick, Protein / creatinine ratio, urine  Hx of bacterial pneumonia  Hospital discharge follow-up thnin k back to0 work to early  suspect still recovering from a septic event. And change in medications i and comorbidities anemia.  Advise stay out another week  And then reassess   Pt reports  will trigger  Forms   i  agree to fill out the next fmla form until reassessment   With PCP or other specialist  Labs urine today and then fu   Consider x ray if needed  Can hold on this for now.  -Patient advised to return or notify health care team  if symptoms worsen ,persist or new concerns arise. Total visit 44mins > 50% spent counseling and coordinating care as indicated in above note and in instructions to patient .   Patient Instructions  You were anemic in hospital and could cause more fatigue  So checking this . Get a fu chest x ray also  We may get  Dr Melvyn Novas or pulmonary to see you in follow up depending on  Results and how you are doing .  Iron Deficiency Anemia, Adult Anemia is a condition in which there are less red blood cells or hemoglobin in the blood than normal. Hemoglobin is the part of red blood cells that carries  oxygen. Iron deficiency anemia is anemia caused by too little iron. It is the most common type of anemia. It may leave you tired and short of breath. CAUSES   Lack of iron in the diet.  Poor absorption of iron, as seen with intestinal disorders.  Intestinal bleeding.  Heavy periods. SIGNS AND SYMPTOMS  Mild anemia may not be noticeable. Symptoms may include:  Fatigue.  Headache.  Pale skin.  Weakness.  Tiredness.  Shortness of  breath.  Dizziness.  Cold hands and feet.  Fast or irregular heartbeat. DIAGNOSIS  Diagnosis requires a thorough evaluation and physical exam by your health care provider. Blood tests are generally used to confirm iron deficiency anemia. Additional tests may be done to find the underlying cause of your anemia. These may include:  Testing for blood in the stool (fecal occult blood test).  A procedure to see inside the colon and rectum (colonoscopy).  A procedure to see inside the esophagus and stomach (endoscopy). TREATMENT  Iron deficiency anemia is treated by correcting the cause of the deficiency. Treatment may involve:  Adding iron-rich foods to your diet.  Taking iron supplements. Pregnant or breastfeeding women need to take extra iron because their normal diet usually does not provide the required amount.  Taking vitamins. Vitamin C improves the absorption of iron. Your health care provider may recommend that you take your iron tablets with a glass of orange juice or vitamin C supplement.  Medicines to make heavy menstrual flow lighter.  Surgery. HOME CARE INSTRUCTIONS   Take iron as directed by your health care provider.  If you cannot tolerate taking iron supplements by mouth, talk to your health care provider about taking them through a vein (intravenously) or an injection into a muscle.  For the best iron absorption, iron supplements should be taken on an empty stomach. If you cannot tolerate them on an empty stomach, you may need to take them with food.  Do not drink milk or take antacids at the same time as your iron supplements. Milk and antacids may interfere with the absorption of iron.  Iron supplements can cause constipation. Make sure to include fiber in your diet to prevent constipation. A stool softener may also be recommended.  Take vitamins as directed by your health care provider.  Eat a diet rich in iron. Foods high in iron include liver, lean beef,  whole-grain bread, eggs, dried fruit, and dark green leafy vegetables. SEEK IMMEDIATE MEDICAL CARE IF:   You faint. If this happens, do not drive. Call your local emergency services (911 in [place] if no other help is available.  You have chest pain.  You feel nauseous or vomit.  You have severe or increased shortness of breath with activity.  You feel weak.  You have a rapid heartbeat.  You have unexplained sweating.  You become light-headed when getting up from a chair or bed. MAKE SURE YOU:   Understand these instructions.  Will watch your condition.  Will get help right away if you are not doing well or get worse.   This information is not intended to replace advice given to you by your health care provider. Make sure you discuss any questions you have with your health care provider.   Document Released: 08/17/2000 Document Revised: 09/10/2014 Document Reviewed: 04/27/2013 Elsevier Interactive Patient Education 2016 [place]. Katalaya Beel M.D.

## 2015-06-17 NOTE — Telephone Encounter (Signed)
That's fine- need 30 minutes- can create if needed

## 2015-06-17 NOTE — Telephone Encounter (Signed)
Pt saw Dr Regis Bill today and Dr Regis Bill is taking her out of work.  Dr Regis Bill asked that pt follow up  with you in 2-3 weeks, since she is transferring to you in Jan 2017. (per AVS)  Pt states she will need to see someone before she goes back to work. Please advise if okay to schedule and work in if no appointment in 2-3 weeks. Thank you

## 2015-06-18 LAB — PROTEIN / CREATININE RATIO, URINE: CREATININE, URINE: 19.6 mg/dL

## 2015-06-20 NOTE — Telephone Encounter (Signed)
Left message on voice mail to call back and schedule appt. °

## 2015-06-21 ENCOUNTER — Other Ambulatory Visit: Payer: Self-pay | Admitting: *Deleted

## 2015-06-21 MED ORDER — ESTRADIOL 0.05 MG/24HR TD PTTW: MEDICATED_PATCH | TRANSDERMAL | Status: DC

## 2015-06-21 NOTE — Telephone Encounter (Signed)
Rx done. 

## 2015-06-24 ENCOUNTER — Ambulatory Visit (INDEPENDENT_AMBULATORY_CARE_PROVIDER_SITE_OTHER): Payer: BLUE CROSS/BLUE SHIELD | Admitting: Family Medicine

## 2015-06-24 ENCOUNTER — Encounter: Payer: Self-pay | Admitting: Family Medicine

## 2015-06-24 ENCOUNTER — Ambulatory Visit: Payer: BLUE CROSS/BLUE SHIELD | Admitting: Family Medicine

## 2015-06-24 VITALS — BP 130/90 | HR 68 | Temp 98.3°F | Wt 144.0 lb

## 2015-06-24 DIAGNOSIS — J189 Pneumonia, unspecified organism: Secondary | ICD-10-CM

## 2015-06-24 DIAGNOSIS — J449 Chronic obstructive pulmonary disease, unspecified: Secondary | ICD-10-CM

## 2015-06-24 DIAGNOSIS — D649 Anemia, unspecified: Secondary | ICD-10-CM

## 2015-06-24 LAB — CBC WITH DIFFERENTIAL/PLATELET
Basophils Absolute: 0 10*3/uL (ref 0.0–0.1)
Basophils Relative: 0.2 % (ref 0.0–3.0)
Eosinophils Absolute: 0 10*3/uL (ref 0.0–0.7)
Eosinophils Relative: 0.2 % (ref 0.0–5.0)
HCT: 39.9 % (ref 36.0–46.0)
Hemoglobin: 12.7 g/dL (ref 12.0–15.0)
LYMPHS ABS: 1.3 10*3/uL (ref 0.7–4.0)
LYMPHS PCT: 21.2 % (ref 12.0–46.0)
MCHC: 31.9 g/dL (ref 30.0–36.0)
MCV: 89.3 fl (ref 78.0–100.0)
MONO ABS: 0.7 10*3/uL (ref 0.1–1.0)
MONOS PCT: 10.9 % (ref 3.0–12.0)
NEUTROS ABS: 4.1 10*3/uL (ref 1.4–7.7)
NEUTROS PCT: 67.5 % (ref 43.0–77.0)
Platelets: 237 10*3/uL (ref 150.0–400.0)
RBC: 4.47 Mil/uL (ref 3.87–5.11)
RDW: 15.6 % — AB (ref 11.5–15.5)
WBC: 6.1 10*3/uL (ref 4.0–10.5)

## 2015-06-24 NOTE — Progress Notes (Signed)
Pre visit review using our clinic review tool, if applicable. No additional management support is needed unless otherwise documented below in the visit note. 

## 2015-06-24 NOTE — Progress Notes (Signed)
   Subjective:    Patient ID: Katherine Foley, female    DOB: 01-07-1951, 64 y.o.   MRN: [id number]  HPI Here to follow up on a recent pneumonia, on anemia, and to discuss when to return to work. About 3 weeks ago she was treated for pneumonia and she followed up with Dr. Regis Bill on 06-17-15. This appears to have completely resolved and she denies any cough or SOB beyond her usual baseline (she has COPD). However in the hospital she was found to be anemic, and her Hgb dropped as low as 9.3. She was told to take OTC iron pills, and she has done this. On her OV last week her Hgb was up to 11.6. She still felt a little weak but was otherwise okay. She and Dr. Regis Bill decided for her to stay out of work a little longer and to see [place] today. Today she feels fine but is still a little weak. Appetite is good.    Review of Systems  Constitutional: Positive for fatigue. Negative for fever.  Eyes: Negative.   Respiratory: Negative.   Cardiovascular: Negative.   Neurological: Negative.        Objective:   Physical Exam  Constitutional: She is oriented to person, place, and time. She appears well-developed and well-nourished. No distress.  Neck: No thyromegaly present.  Cardiovascular: Normal rate, regular rhythm, normal heart sounds and intact distal pulses.   Pulmonary/Chest: Effort normal and breath sounds normal. No respiratory distress. She has no wheezes. She has no rales.  Musculoskeletal: She exhibits no edema.  Lymphadenopathy:    She has no cervical adenopathy.  Neurological: She is alert and oriented to person, place, and time.          Assessment & Plan:  She has recovered from her pneumonia completely. She as been resting at home and trying to get the Hgb back up. We will check another CBC today, and depending on these results, we will tentatively plan on her returning to work on 06-29-15.

## 2015-06-29 ENCOUNTER — Telehealth: Payer: Self-pay | Admitting: Family

## 2015-06-29 NOTE — Telephone Encounter (Signed)
Pt needs a work note to return to work on 06/30/15 with no restrictions. Pt will pick up note

## 2015-06-29 NOTE — Telephone Encounter (Signed)
Dr fry saw her on  10 21  Advising  Back to work  Sending message to him for letter.

## 2015-06-29 NOTE — Telephone Encounter (Signed)
This was done.

## 2015-07-07 ENCOUNTER — Telehealth: Payer: Self-pay | Admitting: Family

## 2015-07-07 NOTE — Telephone Encounter (Signed)
Pt request refill of the following: propranolol (INDERAL) 20 MG tablet  Pt was put on this medicine while in the hospital and is requesting a refill. Pt is also asking if FMLA paperwork was received  Pt said Dr Regis Bill was going to fill out the paperwork because she is the one that took her out of work .  [phone]    Phamacy: [facility]

## 2015-07-08 MED ORDER — PROPRANOLOL HCL 20 MG PO TABS: 20.0000 mg | ORAL_TABLET | Freq: Two times a day (BID) | ORAL | Status: DC

## 2015-07-11 NOTE — Telephone Encounter (Signed)
We havent receive any FMLA pt is aware of that. Rx was call in 07/08/15

## 2015-07-14 ENCOUNTER — Other Ambulatory Visit: Payer: Self-pay | Admitting: Family

## 2015-07-18 ENCOUNTER — Other Ambulatory Visit: Payer: Self-pay | Admitting: Family

## 2015-07-19 NOTE — Telephone Encounter (Signed)
Pt aware and had been scheduled

## 2015-08-04 ENCOUNTER — Other Ambulatory Visit: Payer: Self-pay | Admitting: *Deleted

## 2015-08-04 MED ORDER — ESTRADIOL 0.05 MG/24HR TD PTTW: MEDICATED_PATCH | TRANSDERMAL | Status: DC

## 2015-08-07 ENCOUNTER — Other Ambulatory Visit: Payer: Self-pay | Admitting: Family

## 2015-08-15 ENCOUNTER — Other Ambulatory Visit: Payer: Self-pay | Admitting: Family

## 2015-09-12 ENCOUNTER — Ambulatory Visit: Payer: BLUE CROSS/BLUE SHIELD | Admitting: Family Medicine

## 2015-09-13 ENCOUNTER — Ambulatory Visit: Payer: BLUE CROSS/BLUE SHIELD | Admitting: Family Medicine

## 2015-09-19 ENCOUNTER — Other Ambulatory Visit: Payer: Self-pay | Admitting: Family

## 2015-09-22 ENCOUNTER — Ambulatory Visit (INDEPENDENT_AMBULATORY_CARE_PROVIDER_SITE_OTHER): Payer: PPO | Admitting: Family Medicine

## 2015-09-22 ENCOUNTER — Encounter: Payer: Self-pay | Admitting: Family Medicine

## 2015-09-22 VITALS — BP 122/80 | HR 57 | Temp 97.6°F | Wt 149.0 lb

## 2015-09-22 DIAGNOSIS — F32A Depression, unspecified: Secondary | ICD-10-CM

## 2015-09-22 DIAGNOSIS — F329 Major depressive disorder, single episode, unspecified: Secondary | ICD-10-CM

## 2015-09-22 DIAGNOSIS — D126 Benign neoplasm of colon, unspecified: Secondary | ICD-10-CM | POA: Insufficient documentation

## 2015-09-22 DIAGNOSIS — I2510 Atherosclerotic heart disease of native coronary artery without angina pectoris: Secondary | ICD-10-CM

## 2015-09-22 DIAGNOSIS — Z87891 Personal history of nicotine dependence: Secondary | ICD-10-CM | POA: Insufficient documentation

## 2015-09-22 DIAGNOSIS — E785 Hyperlipidemia, unspecified: Secondary | ICD-10-CM | POA: Diagnosis not present

## 2015-09-22 DIAGNOSIS — J309 Allergic rhinitis, unspecified: Secondary | ICD-10-CM | POA: Insufficient documentation

## 2015-09-22 MED ORDER — LOVASTATIN 20 MG PO TABS: 20.0000 mg | ORAL_TABLET | Freq: Every day | ORAL | Status: DC

## 2015-09-22 NOTE — Assessment & Plan Note (Signed)
S: Patient on somewhat atypical regimen with Pristiq 100mg  24 hour, zoloft 50mg . She states she has been stable with this but has trouble recently. She starts crying and states absolutely not willing to go into this. Part of this seems to be finance related with no rx drug coverage. She will not complete PHQ2 or 9 at this time. She is able to assure me no SI A/P: continue current rx despite seeming poorly controlled as I seek to build this relationship a little deeper with 3 month follow up. Did give behavioral health handout as well. Stressed repeatedly plan if she were to develop SI- call us or 911 immediately

## 2015-09-22 NOTE — Assessment & Plan Note (Signed)
S: patient denies exertional chest pain. She had a CABG 2012 and also has 2 stents. She takes aspirin and propranolol. She has not been able to afford crestor A/P: though asymptomatic- I did encourage patient strongly to follow up with cardiology considering her AVR. Also see HLD in regards to statin.

## 2015-09-22 NOTE — Patient Instructions (Addendum)
Great to meet you!   Please start lovastatin 20mg  daily (should be on $4 list) . Need stronger medicine to protect your heart but we will do this when you have prescription coverage  I also encourage you to go ahead and get on cardiology schedule as it can take months to get in now.   Please take a handout for behavioral health- I would be more than happy to talk to you about your depression symptoms as well but you did not want to explore this today. Call me if you change your mind. Also call us or 911 immediately if any thoughts of hurting yourself  At next visit if you have insurance set up we need to get you set up for mammogram and bone density test .

## 2015-09-22 NOTE — Progress Notes (Signed)
Katherine Reddish, MD Phone: [phone]  Subjective:  Patient presents today to establish care with me as their new primary care provider. Patient was formerly a patient of Dr. Arnoldo Morale. Chief complaint-noted.   See problem oriented charting ROS- denies chest pain. No Si/HI. Does have shortness of breath at times from COPD and uses albuterol at that time.   The following were reviewed and entered/updated in epic: Past Medical History  Diagnosis Date  . AORTIC STENOSIS 07/07/2010    a. echo 12/23/10: EF 60-65%, mod to severe AS with mean gradient 29 mmHg;  b. 04/2011 s/p bioprosthetic AVR.  Marland Kitchen Barrett's esophagus 04/14/2007  . CAD, NATIVE VESSEL 05/17/2010    a. NSTEMI 8/11 - BMS to RCA;  b. cath 4/12: dLAD 50%, RI 80% (PCI), AVCFX 30%, pRCA 30%, stent ok, 40-50, then 50% dist RCA;  c. s/p Promus DES to RI 12/25/10;  d. 04/2011 CABG x 1 (LIMA->LAD) @ time of AVR  . Carotid stenosis 05/17/2010    a. dopplers Q000111Q: RICA Q000111Q; LICA [id number] (follow up due 11/12)  . 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 Herpes simplex without mention of complication 123XX123  . [place], MILD 08/29/2009  . OSTEOPENIA 04/14/2007  . URINARY INCONTINENCE, STRESS, MILD 08/26/2007  . GERD (gastroesophageal reflux disease)   . Ovarian cyst   . Urine, incontinence, stress female   . Adenomatous colon polyp   . HYPERTENSION 04/14/2007  . Myocardial infarction [facility]) 2011    "during catherization" (03/25/2013)  . Hemorrhoid thrombosis     "had it lanced" (03/25/2013)  . Pneumonia     "multiple times" (03/25/2013)  . Chronic bronchitis ([place])     "multiple times" (03/25/2013)  . Exertional shortness of breath     "related to COPD problems" (03/25/2013)  . History of blood transfusion     'w/OHS" (03/25/2013)  . H/O hiatal hernia   . MIGRAINE HEADACHE 04/20/2008  . ANXIETY DEPRESSION 02/15/2009  . Barrett's esophagus 2000   Patient Active Problem List   Diagnosis Date Noted  . Depression  09/22/2015    Priority: High  . Former smoker 09/22/2015    Priority: High  . S/P AVR (aortic valve replacement) 11/06/2013    Priority: High  . Aortic stenosis 07/07/2010    Priority: High  . CAD, NATIVE VESSEL 05/17/2010    Priority: High  . COPD GOLD II  07/23/2007    Priority: High  . Diastolic dysfunction 123456    Priority: Medium  . HLD (hyperlipidemia) 08/29/2009    Priority: Medium  . Essential hypertension 04/14/2007    Priority: Medium  . Allergic rhinitis 09/22/2015    Priority: Low  . GERD (gastroesophageal reflux disease) 06/04/2014    Priority: Low  . Adenomatous colon polyp   . CAP (community acquired pneumonia)   . Carotid stenosis 05/17/2010  . Herpes simplex virus (HSV) infection 01/16/2010  . Osteopenia 04/14/2007   Past Surgical History  Procedure Laterality Date  . Tonsillectomy and adenoidectomy    . Nasal sinus surgery  1997; 1998    "cust in maxillary sinus; put a stent in then removed it" (03/25/2013)  . Blepharoplasty Bilateral ~ 1998  . Thrombosed hemorrohid lanced    . Ovarian cyst removal Right 2011    "size of a soccer ball" (03/25/2013)  . Appendectomy    . Total abdominal hysterectomy    . Aortic valve replacement  2012  . Coronary angioplasty with stent placement  2010    "I have 2 stents" (  03/25/2013)  . Cholecystectomy N/A 03/25/2013    Procedure: LAPAROSCOPIC CHOLECYSTECTOMY WITH INTRAOPERATIVE CHOLANGIOGRAM;  Surgeon: Imogene Burn. Georgette Dover, MD;  Location: [place] OR;  Service: General;  Laterality: N/A;    Family History  Problem Relation Age of Onset  . Coronary artery disease Other   . Heart disease Other     6 stents  . Heart disease Mother   . COPD Father     smoked  . Heart disease Father   . Heart disease Maternal Uncle   . Breast cancer Daughter   . Colon cancer Neg Hx   . Colon polyps Neg Hx   . Asthma Son     as a child   . Rheum arthritis Daughter   . Rheum arthritis Father   . Breast cancer Daughter      Medications- reviewed and updated Current Outpatient Prescriptions  Medication Sig Dispense Refill  . aspirin 81 MG tablet Take 81 mg by mouth daily.      Marland Kitchen BIOTIN 5000 PO Take 1 capsule by mouth daily.    . budesonide-formoterol (SYMBICORT) 160-4.5 MCG/ACT inhaler Inhale 2 puffs into the lungs 2 (two) times daily. 1 Inhaler 5  . esomeprazole (NEXIUM) 40 MG capsule TAKE ONE CAPSULE BY MOUTH ONCE DAILY 90 capsule 3  . estradiol (VIVELLE-DOT) 0.05 MG/24HR patch APPLY ONE PATCH TOPICALLY AND CHANGE TWICE PER WEEK 10 patch 0  . montelukast (SINGULAIR) 10 MG tablet TAKE ONE TABLET BY MOUTH AT BEDTIME 30 tablet 0  . PRISTIQ 100 MG 24 hr tablet TAKE ONE TABLET BY MOUTH ONCE DAILY 30 tablet 0  . propranolol (INDERAL) 20 MG tablet TAKE ONE TABLET BY MOUTH TWICE DAILY 60 tablet 0  . sertraline (ZOLOFT) 50 MG tablet TAKE ONE TABLET BY MOUTH ONCE DAILY 30 tablet 0  . SPIRIVA HANDIHALER 18 MCG inhalation capsule INHALE ONE DOSE BY MOUTH ONCE DAILY 30 capsule 6  . albuterol (PROVENTIL HFA;VENTOLIN HFA) 108 (90 BASE) MCG/ACT inhaler Inhale 2 puffs into the lungs every 6 (six) hours as needed. (Patient not taking: Reported on 09/22/2015) 1 Inhaler 1   Allergies-reviewed and updated Allergies  Allergen Reactions  . Codeine Sulfate Rash    REACTION: unspecified  . Hydrocodone-Acetaminophen Rash    REACTION: unspecified  . Amoxicillin-Pot Clavulanate     REACTION: unspecified    Social History   Social History  . Marital Status: Single    Spouse Name: N/A  . Number of Children: 3  . Years of Education: N/A   Occupational History  . [place] History Main Topics  . Smoking status: Former Smoker -- 1.50 packs/day for 43 years    Types: Cigarettes    Quit date: 09/03/2005  . Smokeless tobacco: Never Used  . Alcohol Use: 0.0 oz/week    0 Standard drinks or equivalent per week  . Drug Use: No  . Sexual Activity: Not Asked   Other Topics Concern  . None   Social History  Narrative   Prolonged engagement 17 years in 2017. 3 children. 6 grandkids. 2 greatgrandkids. Keeps greatgrandson on tuesdays- loves her so much.       Retired from [employer] (parttime- Dec 12)   Faith is very important to her      Hobbies: reading, cross stich, sew. Wants to learn to knit.           ROS--See HPI   Objective: BP 122/80 mmHg  Pulse 57  Temp(Src) 97.6 F (36.4 C)  Wt 149 lb (  67.586 kg) Gen: NAD, resting comfortably HEENT: Mucous membranes are moist. Oropharynx normal Neck: no thyromegaly CV: RRR no rubs or gallops Lungs: CTAB no crackles, wheeze, rhonchi Abdomen: soft/nontender/nondistended/normal bowel sounds. No rebound or guarding.  Ext: no edema Skin: warm, dry Neuro: grossly normal, moves all extremities, PERRLA  Assessment/Plan:  CAD, NATIVE VESSEL S: patient denies exertional chest pain. She had a CABG 2012 and also has 2 stents. She takes aspirin and propranolol. She has not been able to afford crestor A/P: though asymptomatic- I did encourage patient strongly to follow up with cardiology considering her AVR. Also see HLD in regards to statin.    HLD (hyperlipidemia) S: has been out of crestor 40mg  for some time. Currently has no rx drug coverage.  A/P:Lovastatin 20mg  from $4 [facility] list until gets back on insurance with rx coverage- then crestor 40mg . Return in 3 months  Depression S: Patient on somewhat atypical regimen with Pristiq 100mg  24 hour, zoloft 50mg . She states she has been stable with this but has trouble recently. She starts crying and states absolutely not willing to go into this. Part of this seems to be finance related with no rx drug coverage. She will not complete PHQ2 or 9 at this time. She is able to assure me no SI A/P: continue current rx despite seeming poorly controlled as I seek to build this relationship a little deeper with 3 month follow up. Did give behavioral health handout as well. Stressed repeatedly plan if she were to  develop SI- call us or 911 immediately    Declines all until insurance plan improved Health Maintenance Due  Topic Date Due  . Hepatitis C Screening  08/06/1951  . MAMMOGRAM  04/11/1999  . ZOSTAVAX  04/10/2009  . DEXA SCAN  04/10/2014   3 months. Return precautions advised.   Meds ordered this encounter  Medications  . lovastatin (MEVACOR) 20 MG tablet    Sig: Take 1 tablet (20 mg total) by mouth at bedtime.    Dispense:  30 tablet    Refill:  11

## 2015-09-22 NOTE — Assessment & Plan Note (Signed)
S: has been out of crestor 40mg  for some time. Currently has no rx drug coverage.  A/P:Lovastatin 20mg  from $4 [facility] list until gets back on insurance with rx coverage- then crestor 40mg . Return in 3 months

## 2015-09-27 ENCOUNTER — Other Ambulatory Visit: Payer: Self-pay | Admitting: Family

## 2015-10-10 ENCOUNTER — Other Ambulatory Visit: Payer: Self-pay

## 2015-10-10 DIAGNOSIS — Z1231 Encounter for screening mammogram for malignant neoplasm of breast: Secondary | ICD-10-CM

## 2015-10-20 ENCOUNTER — Ambulatory Visit: Payer: BLUE CROSS/BLUE SHIELD

## 2015-10-21 ENCOUNTER — Other Ambulatory Visit: Payer: Self-pay | Admitting: Family

## 2015-10-26 ENCOUNTER — Other Ambulatory Visit: Payer: Self-pay | Admitting: Family Medicine

## 2015-10-26 ENCOUNTER — Telehealth: Payer: Self-pay | Admitting: Cardiovascular Disease

## 2015-10-26 NOTE — Telephone Encounter (Signed)
Appointment scheduled 11/21/15 with Dr Burt Knack.

## 2015-10-26 NOTE — Telephone Encounter (Signed)
New Message  Pt returned call. She asks that we make sure to call her on her mobile number verses home phone

## 2015-10-26 NOTE — Telephone Encounter (Signed)
I left the pt a message on her cell phone that a scheduler can make her an appointment with Dr Burt Knack the week of 11/21/15.

## 2015-10-27 ENCOUNTER — Ambulatory Visit: Payer: BLUE CROSS/BLUE SHIELD

## 2015-11-11 ENCOUNTER — Ambulatory Visit
Admission: RE | Admit: 2015-11-11 | Discharge: 2015-11-11 | Disposition: A | Discharge status: Home / Self Care | Payer: PPO | Source: Ambulatory Visit

## 2015-11-11 DIAGNOSIS — Z1231 Encounter for screening mammogram for malignant neoplasm of breast: Secondary | ICD-10-CM

## 2015-11-18 ENCOUNTER — Other Ambulatory Visit: Payer: Self-pay | Admitting: Family

## 2015-11-21 ENCOUNTER — Ambulatory Visit (INDEPENDENT_AMBULATORY_CARE_PROVIDER_SITE_OTHER): Payer: PPO | Admitting: Cardiovascular Disease

## 2015-11-21 ENCOUNTER — Encounter: Payer: Self-pay | Admitting: Cardiovascular Disease

## 2015-11-21 VITALS — BP 124/78 | HR 63 | Ht 62.0 in | Wt 152.0 lb

## 2015-11-21 DIAGNOSIS — R0989 Other specified symptoms and signs involving the circulatory and respiratory systems: Secondary | ICD-10-CM

## 2015-11-21 DIAGNOSIS — R0602 Shortness of breath: Secondary | ICD-10-CM | POA: Diagnosis not present

## 2015-11-21 DIAGNOSIS — I25118 Atherosclerotic heart disease of native coronary artery with other forms of angina pectoris: Secondary | ICD-10-CM | POA: Diagnosis not present

## 2015-11-21 DIAGNOSIS — I359 Nonrheumatic aortic valve disorder, unspecified: Secondary | ICD-10-CM

## 2015-11-21 NOTE — Progress Notes (Signed)
Cardiology Office Note Date:  11/21/2015   ID:  Jerrel, Sudlow 14-Sep-1950, MRN [id number]  PCP:  Garret Reddish, MD  Cardiologist:  Sherren Mocha, MD    Chief Complaint  Patient presents with  . Chest Pain     History of Present Illness: Katherine Foley is a 65 y.o. female who presents for  Cardiac evaluation. She was last seen by me in 2012 and Truitt Merle in 2014. The patient has coronary artery disease and she underwent bare-metal stenting of the right coronary artery when she presented with non-ST elevation MI in 2011. The following year she underwent PCI of the left circumflex. She had been followed for aortic stenosis which progressed and she ultimately was treated with aortic valve replacement using a Magna Ease pericardial valve (21 mm) and LIMA-LAD graft.   She has exertional dyspnea, but relates this to 'COPD.' She has chest pain in the center of her chest and in her upper back. Feels like 'stress' brings symptoms on. No exertional chest pain or pressure. This in fact occurs more frequently with resting or lying in bed. No recent leg swelling, only notices this with salty foods. Complains of lightheadedness but no syncope.   Past Medical History  Diagnosis Date  . AORTIC STENOSIS 07/07/2010    a. echo 12/23/10: EF 60-65%, mod to severe AS with mean gradient 29 mmHg;  b. 04/2011 s/p bioprosthetic AVR.  Marland Kitchen Barrett's esophagus 04/14/2007  . CAD, NATIVE VESSEL 05/17/2010    a. NSTEMI 8/11 - BMS to RCA;  b. cath 4/12: dLAD 50%, RI 80% (PCI), AVCFX 30%, pRCA 30%, stent ok, 40-50, then 50% dist RCA;  c. s/p Promus DES to RI 12/25/10;  d. 04/2011 CABG x 1 (LIMA->LAD) @ time of AVR  . Carotid stenosis 05/17/2010    a. dopplers Q000111Q: RICA Q000111Q; LICA [id number] (follow up due 11/12)  . 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 Herpes simplex without mention of complication 123XX123  . [place], MILD 08/29/2009  . OSTEOPENIA 04/14/2007  . URINARY INCONTINENCE, STRESS,  MILD 08/26/2007  . GERD (gastroesophageal reflux disease)   . Ovarian cyst   . Urine, incontinence, stress female   . Adenomatous colon polyp   . HYPERTENSION 04/14/2007  . Myocardial infarction [facility]) 2011    "during catherization" (03/25/2013)  . Hemorrhoid thrombosis     "had it lanced" (03/25/2013)  . Pneumonia     "multiple times" (03/25/2013)  . Chronic bronchitis ([place])     "multiple times" (03/25/2013)  . Exertional shortness of breath     "related to COPD problems" (03/25/2013)  . History of blood transfusion     'w/OHS" (03/25/2013)  . H/O hiatal hernia   . MIGRAINE HEADACHE 04/20/2008  . ANXIETY DEPRESSION 02/15/2009  . Barrett's esophagus 2000    Past Surgical History  Procedure Laterality Date  . Tonsillectomy and adenoidectomy    . Nasal sinus surgery  1997; 1998    "cust in maxillary sinus; put a stent in then removed it" (03/25/2013)  . Blepharoplasty Bilateral ~ 1998  . Thrombosed hemorrohid lanced    . Ovarian cyst removal Right 2011    "size of a soccer ball" (03/25/2013)  . Appendectomy    . Total abdominal hysterectomy    . Aortic valve replacement  2012  . Coronary angioplasty with stent placement  2010    "I have 2 stents" (03/25/2013)  . Cholecystectomy N/A 03/25/2013    Procedure: LAPAROSCOPIC CHOLECYSTECTOMY WITH INTRAOPERATIVE CHOLANGIOGRAM;  Surgeon: Imogene Burn. Georgette Dover, MD;  Location: [place] OR;  Service: General;  Laterality: N/A;    Current Outpatient Prescriptions  Medication Sig Dispense Refill  . albuterol (PROVENTIL HFA;VENTOLIN HFA) 108 (90 BASE) MCG/ACT inhaler Inhale 2 puffs into the lungs every 6 (six) hours as needed. 1 Inhaler 1  . aspirin 81 MG tablet Take 81 mg by mouth daily.      Marland Kitchen BIOTIN 5000 PO Take 1 capsule by mouth daily.    . budesonide-formoterol (SYMBICORT) 160-4.5 MCG/ACT inhaler Inhale 2 puffs into the lungs 2 (two) times daily. 1 Inhaler 5  . esomeprazole (NEXIUM) 40 MG capsule TAKE ONE CAPSULE BY MOUTH ONCE DAILY 90 capsule 3  .  lovastatin (MEVACOR) 20 MG tablet Take 1 tablet (20 mg total) by mouth at bedtime. 30 tablet 11  . PRISTIQ 100 MG 24 hr tablet TAKE ONE TABLET BY MOUTH ONCE DAILY 30 tablet 5  . propranolol (INDERAL) 20 MG tablet TAKE ONE TABLET BY MOUTH TWICE DAILY 60 tablet 3  . sertraline (ZOLOFT) 50 MG tablet TAKE ONE TABLET BY MOUTH ONCE DAILY 30 tablet 5  . SPIRIVA HANDIHALER 18 MCG inhalation capsule INHALE ONE DOSE BY MOUTH ONCE DAILY 30 capsule 0  . [DISCONTINUED] Aclidinium Bromide (TUDORZA PRESSAIR) 400 MCG/ACT AEPB Inhale 1 puff into the lungs 2 (two) times daily. 1 each 11   No current facility-administered medications for this visit.    Allergies:   Codeine sulfate; Hydrocodone-acetaminophen; and Amoxicillin-pot clavulanate   Social History:  The patient  reports that she quit smoking about 10 years ago. Her smoking use included Cigarettes. She has a 64.5 pack-year smoking history. She has never used smokeless tobacco. She reports that she drinks alcohol. She reports that she does not use illicit drugs.   Family History:  The patient's family history includes Asthma in her son; Breast cancer in her daughter and daughter; COPD in her father; Coronary artery disease in her other; Heart disease in her father, maternal uncle, mother, and other; Rheum arthritis in her daughter and father. There is no history of Colon cancer or Colon polyps.    ROS:  Please see the history of present illness.  Otherwise, review of systems is positive for headaches.  All other systems are reviewed and negative.    PHYSICAL EXAM: VS:  BP 124/78 mmHg  Pulse 63  Ht 5\' 2"  (1.575 m)  Wt 152 lb (68.947 kg)  BMI 27.79 kg/m2 , BMI Body mass index is 27.79 kg/(m^2). GEN: Well nourished, well developed, in no acute distress HEENT: normal Neck: no JVD, no masses. Right carotid bruit Cardiac: RRR with 2/6 systolic murmur at the RUSB            Respiratory:  clear to auscultation bilaterally, normal work of breathing GI:  soft, nontender, nondistended, + BS MS: no deformity or atrophy Ext: no pretibial edema, pedal pulses 2+= bilaterally Skin: warm and dry, no rash Neuro:  Strength and sensation are intact Psych: euthymic mood, full affect  EKG:  EKG is ordered today. The ekg ordered today shows NSR 63 bpm, nonspecific T wave abnormality  Recent Labs: 05/31/2015: B Natriuretic Peptide 252.8*; TSH 0.357 06/03/2015: Magnesium 1.7 06/17/2015: ALT 30; BUN 17; Creatinine, Ser 0.49; Potassium 4.5; Sodium 134* 06/24/2015: Hemoglobin 12.7; Platelets 237.0   Lipid Panel     Component Value Date/Time   CHOL 157 12/09/2014 1109   TRIG 74.0 12/09/2014 1109   HDL 65.00 12/09/2014 1109   CHOLHDL 2 12/09/2014 1109  VLDL 14.8 12/09/2014 1109   LDLCALC 77 12/09/2014 1109   LDLDIRECT 73.3 10/02/2010 1408      Wt Readings from Last 3 Encounters:  11/21/15 152 lb (68.947 kg)  09/22/15 149 lb (67.586 kg)  06/24/15 144 lb (65.318 kg)     Cardiac Studies Reviewed:  2-D echocardiogram 11/06/2013: Study Conclusions  - Left ventricle: The cavity size was normal. Wall thickness was normal. Systolic function was normal. The estimated ejection fraction was in the range of 60% to 65%. Wall motion was normal; there were no regional wall motion abnormalities. Doppler parameters are consistent with abnormal left ventricular relaxation (grade 1 diastolic dysfunction). The E/e' ratio is >10, suggesting elevated LV filling pressure. - Aortic valve: Bioprosthetic aortic valve - leaflets not well visualized. No rocking motion or paravalvular leak. Transvalvular velocity was minimally increased. Peak and mean gradients are 14 mmHg and 7 mmHg, which is minimally changed from the prior study, but acceptable for a bioprosthetic valve in this position. - Mitral valve: Mildly thickened leaflets . Mild regurgitation. - Left atrium: The atrium was normal in size. - Inferior vena cava: The vessel was  normal in size; the respirophasic diameter changes were in the normal range (= 50%); findings are consistent with normal central venous pressure. - Pericardium, extracardiac: There was no pericardial effusion.  ASSESSMENT AND PLAN: 1.   CAD, native vessel, with angina: Patient does have some symptoms reminiscent of her previous cardiac pain with chest and upper back discomfort. Symptoms have been self-limited. There are some typical and atypical features as there is no exertional component. However, the patient is not very active with limitation related to COPD. I have recommended a Lexiscan Myoview stress study for further risk stratification. Current medicines will be continued. It would be reasonable to consider a long-acting nitrate pending results of her stress test.  2. Aortic valve disorder: Patient with history of bicuspid aortic valve stenosis. She is status post bioprosthetic aortic valve replacement. Her last echocardiogram 2 years ago was reviewed with normal transvalvular gradients and no evidence of valvular insufficiency. Recommend a repeat echocardiogram in the setting of chronic exertional dyspnea.  3. Hyperlipidemia: Treated with lovastatin  4. Right carotid bruit: last carotid study reviewed demonstrating 40-59% right ICA stenosis and less than 40% left ICA stenosis. There were severely elevated velocities in the right external carotid artery and I suspect this is the source of her bruit. However, she is due for follow-up carotid duplex imaging and this will be arranged.  Current medicines are reviewed with the patient today.  The patient does not have concerns regarding medicines.  Labs/ tests ordered today include:   Orders Placed This Encounter  Procedures  . Myocardial Perfusion Imaging  . EKG 12-Lead  . Echocardiogram    Disposition:   FU one year  Signed, Sherren Mocha, MD  11/21/2015 4:47 PM    [address],  [place], [place]  [address] Phone: [phone]; Fax: [phone]

## 2015-11-21 NOTE — Patient Instructions (Signed)
Medication Instructions:  Your physician recommends that you continue on your current medications as directed. Please refer to the Current Medication list given to you today.  Labwork: No new orders.   Testing/Procedures: Your physician has requested that you have a carotid duplex. This test is an ultrasound of the carotid arteries in your neck. It looks at blood flow through these arteries that supply the brain with blood. Allow one hour for this exam. There are no restrictions or special instructions.  Your physician has requested that you have an echocardiogram. Echocardiography is a painless test that uses sound waves to create images of your heart. It provides your doctor with information about the size and shape of your heart and how well your heart's chambers and valves are working. This procedure takes approximately one hour. There are no restrictions for this procedure.  Your physician has requested that you have a lexiscan myoview. For further information please visit [email or web address]. Please follow instruction sheet, as given.  Follow-Up: Your physician wants you to follow-up in: 1 YEAR with Dr Burt Knack.  You will receive a reminder letter in the mail two months in advance. If you don't receive a letter, please call our office to schedule the follow-up appointment.   Any Other Special Instructions Will Be Listed Below (If Applicable).     If you need a refill on your cardiac medications before your next appointment, please call your pharmacy.

## 2015-11-23 ENCOUNTER — Other Ambulatory Visit: Payer: Self-pay | Admitting: Family Medicine

## 2015-11-23 DIAGNOSIS — R928 Other abnormal and inconclusive findings on diagnostic imaging of breast: Secondary | ICD-10-CM

## 2015-11-25 ENCOUNTER — Encounter: Payer: Self-pay | Admitting: Family Medicine

## 2015-11-25 ENCOUNTER — Ambulatory Visit (INDEPENDENT_AMBULATORY_CARE_PROVIDER_SITE_OTHER): Payer: PPO | Admitting: Family Medicine

## 2015-11-25 VITALS — BP 120/80 | HR 70 | Temp 98.1°F | Wt 149.0 lb

## 2015-11-25 DIAGNOSIS — J441 Chronic obstructive pulmonary disease with (acute) exacerbation: Secondary | ICD-10-CM

## 2015-11-25 MED ORDER — PREDNISONE 20 MG PO TABS: ORAL_TABLET | ORAL | Status: DC

## 2015-11-25 MED ORDER — AZITHROMYCIN 250 MG PO TABS: ORAL_TABLET | ORAL | Status: DC

## 2015-11-25 NOTE — Progress Notes (Signed)
Pre visit review using our clinic review tool, if applicable. No additional management support is needed unless otherwise documented below in the visit note. 

## 2015-11-25 NOTE — Patient Instructions (Addendum)
Upper Respiratory infection leading to COPD exacerbation Given increase in sputum production and change to green along with chest tightness- will treat for COPD exacerbation with prednisone and azithromycin especially given weekend and cannot ensure good follow up over the weekend.   Finally, we reviewed reasons to return to care including if symptoms worsen or persist or new concerns arise (especially fever or worsening shortness of breath or chest pain beyond her typical tightness with COPD exacerbations)  Meds ordered this encounter  . predniSONE (DELTASONE) 20 MG tablet    Sig: Take 2 pills for 3 days, 1 pill for 4 days    Dispense:  10 tablet    Refill:  0  . azithromycin (ZITHROMAX) 250 MG tablet    Sig: Take 2 tabs on day 1, then 1 tab daily until finished    Dispense:  6 tablet    Refill:  0

## 2015-11-25 NOTE — Progress Notes (Signed)
PCP: Garret Reddish, MD  Subjective:  Katherine Foley is a 65 y.o. year old very pleasant female patient who presents with Upper Respiratory infection symptoms including nasal congestion, sore throat, cough but also with concern for COPD exacerbation with recent development in last day of chest congestion and green sputum with increased volume as well as some chest tightness though minimal wheeze (no exertional component and has lexiscan myoview next week). Had pneumonia last year and wanted to make sure no progression to that.  -started: Monday with sore throat and mild dry cough, thought she wa sgetting better then developed productive green cough and has had some chest tightness  - symptoms are worsening -previous treatments: rest, hydration -sick contacts/travel/risks: denies flu exposure.  -Hx of: allergies- medicine usually does not help  ROS-denies fever,  NVD, tooth pain. No clear increase in shortness of breath  Pertinent Past Medical History-  Patient Active Problem List   Diagnosis Date Noted  . Depression 09/22/2015    Priority: High  . Former smoker 09/22/2015    Priority: High  . S/P AVR (aortic valve replacement) 11/06/2013    Priority: High  . Aortic stenosis 07/07/2010    Priority: High  . CAD, NATIVE VESSEL 05/17/2010    Priority: High  . COPD GOLD II  07/23/2007    Priority: High  . Diastolic dysfunction 123456    Priority: Medium  . HLD (hyperlipidemia) 08/29/2009    Priority: Medium  . Essential hypertension 04/14/2007    Priority: Medium  . Allergic rhinitis 09/22/2015    Priority: Low  . GERD (gastroesophageal reflux disease) 06/04/2014    Priority: Low  . Adenomatous colon polyp   . CAP (community acquired pneumonia)   . Carotid stenosis 05/17/2010  . Herpes simplex virus (HSV) infection 01/16/2010  . Osteopenia 04/14/2007   Medications- reviewed  Current Outpatient Prescriptions  Medication Sig Dispense Refill  . albuterol (PROVENTIL  HFA;VENTOLIN HFA) 108 (90 BASE) MCG/ACT inhaler Inhale 2 puffs into the lungs every 6 (six) hours as needed. 1 Inhaler 1  . aspirin 81 MG tablet Take 81 mg by mouth daily.      Marland Kitchen BIOTIN 5000 PO Take 1 capsule by mouth daily.    . budesonide-formoterol (SYMBICORT) 160-4.5 MCG/ACT inhaler Inhale 2 puffs into the lungs 2 (two) times daily. 1 Inhaler 5  . esomeprazole (NEXIUM) 40 MG capsule TAKE ONE CAPSULE BY MOUTH ONCE DAILY 90 capsule 3  . lovastatin (MEVACOR) 20 MG tablet Take 1 tablet (20 mg total) by mouth at bedtime. 30 tablet 11  . lovastatin (MEVACOR) 20 MG tablet Take 20 mg by mouth at bedtime.    Marland Kitchen PRISTIQ 100 MG 24 hr tablet TAKE ONE TABLET BY MOUTH ONCE DAILY 30 tablet 5  . propranolol (INDERAL) 20 MG tablet TAKE ONE TABLET BY MOUTH TWICE DAILY 60 tablet 3  . sertraline (ZOLOFT) 50 MG tablet TAKE ONE TABLET BY MOUTH ONCE DAILY 30 tablet 5  . SPIRIVA HANDIHALER 18 MCG inhalation capsule INHALE ONE DOSE BY MOUTH ONCE DAILY 30 capsule 0  . [DISCONTINUED] Aclidinium Bromide (TUDORZA PRESSAIR) 400 MCG/ACT AEPB Inhale 1 puff into the lungs 2 (two) times daily. 1 each 11   No current facility-administered medications for this visit.    Objective: BP 120/80 mmHg  Pulse 70  Temp(Src) 98.1 F (36.7 C) (Oral)  Wt 149 lb (67.586 kg)  SpO2 96% Gen: NAD, resting comfortably HEENT: Turbinates erythematous, TM normal, pharynx mildly erythematous with no tonsilar exudate or edema, no  sinus tenderness CV: RRR no murmurs rubs or gallops Lungs: CTAB no crackles, wheeze, rhonchi Abdomen: soft/nontender/nondistended/normal bowel sounds. No rebound or guarding.  Ext: no edema Skin: warm, dry, no rash Neuro: grossly normal, moves all extremities  Assessment/Plan:  Upper Respiratory infection leading to COPD exacerbation Given increase in sputum production and change to green along with chest tightness- will treat for COPD exacerbation with prednisone and azithromycin especially given weekend and  cannot ensure good follow up over the weekend.   Finally, we reviewed reasons to return to care including if symptoms worsen or persist or new concerns arise (especially fever or worsening shortness of breath or chest pain beyond her typical tightness with COPD exacerbations)  Meds ordered this encounter  Medications  . lovastatin (MEVACOR) 20 MG tablet    Sig: Take 20 mg by mouth at bedtime.  . predniSONE (DELTASONE) 20 MG tablet    Sig: Take 2 pills for 3 days, 1 pill for 4 days    Dispense:  10 tablet    Refill:  0  . azithromycin (ZITHROMAX) 250 MG tablet    Sig: Take 2 tabs on day 1, then 1 tab daily until finished    Dispense:  6 tablet    Refill:  0

## 2015-11-28 ENCOUNTER — Other Ambulatory Visit: Payer: Self-pay | Admitting: Family Medicine

## 2015-11-28 ENCOUNTER — Ambulatory Visit
Admission: RE | Admit: 2015-11-28 | Discharge: 2015-11-28 | Disposition: A | Discharge status: Home / Self Care | Payer: PPO | Source: Ambulatory Visit | Attending: Family Medicine | Admitting: Family Medicine

## 2015-11-28 DIAGNOSIS — R921 Mammographic calcification found on diagnostic imaging of breast: Secondary | ICD-10-CM | POA: Diagnosis not present

## 2015-11-28 DIAGNOSIS — R928 Other abnormal and inconclusive findings on diagnostic imaging of breast: Secondary | ICD-10-CM

## 2015-11-30 ENCOUNTER — Telehealth (HOSPITAL_COMMUNITY): Payer: Self-pay

## 2015-11-30 NOTE — Telephone Encounter (Signed)
Encounter complete. 

## 2015-12-02 ENCOUNTER — Ambulatory Visit (HOSPITAL_COMMUNITY)
Admission: RE | Admit: 2015-12-02 | Discharge: 2015-12-02 | Disposition: A | Discharge status: Home / Self Care | Payer: PPO | Source: Ambulatory Visit | Attending: Cardiology | Admitting: Cardiology

## 2015-12-02 ENCOUNTER — Ambulatory Visit (HOSPITAL_COMMUNITY)
Admission: RE | Admit: 2015-12-02 | Discharge: 2015-12-02 | Disposition: A | Discharge status: Home / Self Care | Payer: PPO | Source: Ambulatory Visit | Attending: Cardiovascular Disease | Admitting: Cardiovascular Disease

## 2015-12-02 ENCOUNTER — Ambulatory Visit (HOSPITAL_BASED_OUTPATIENT_CLINIC_OR_DEPARTMENT_OTHER)
Admission: RE | Admit: 2015-12-02 | Discharge: 2015-12-02 | Disposition: A | Discharge status: Home / Self Care | Payer: PPO | Source: Ambulatory Visit | Attending: Cardiovascular Disease | Admitting: Cardiovascular Disease

## 2015-12-02 DIAGNOSIS — I071 Rheumatic tricuspid insufficiency: Secondary | ICD-10-CM | POA: Insufficient documentation

## 2015-12-02 DIAGNOSIS — Z87891 Personal history of nicotine dependence: Secondary | ICD-10-CM | POA: Diagnosis not present

## 2015-12-02 DIAGNOSIS — Z953 Presence of xenogenic heart valve: Secondary | ICD-10-CM | POA: Insufficient documentation

## 2015-12-02 DIAGNOSIS — I340 Nonrheumatic mitral (valve) insufficiency: Secondary | ICD-10-CM | POA: Diagnosis not present

## 2015-12-02 DIAGNOSIS — R0602 Shortness of breath: Secondary | ICD-10-CM | POA: Diagnosis not present

## 2015-12-02 DIAGNOSIS — I358 Other nonrheumatic aortic valve disorders: Secondary | ICD-10-CM | POA: Diagnosis not present

## 2015-12-02 DIAGNOSIS — R079 Chest pain, unspecified: Secondary | ICD-10-CM | POA: Insufficient documentation

## 2015-12-02 DIAGNOSIS — R42 Dizziness and giddiness: Secondary | ICD-10-CM | POA: Diagnosis not present

## 2015-12-02 DIAGNOSIS — I25118 Atherosclerotic heart disease of native coronary artery with other forms of angina pectoris: Secondary | ICD-10-CM

## 2015-12-02 DIAGNOSIS — J449 Chronic obstructive pulmonary disease, unspecified: Secondary | ICD-10-CM | POA: Insufficient documentation

## 2015-12-02 DIAGNOSIS — R0989 Other specified symptoms and signs involving the circulatory and respiratory systems: Secondary | ICD-10-CM | POA: Insufficient documentation

## 2015-12-02 DIAGNOSIS — I119 Hypertensive heart disease without heart failure: Secondary | ICD-10-CM | POA: Insufficient documentation

## 2015-12-02 DIAGNOSIS — I6523 Occlusion and stenosis of bilateral carotid arteries: Secondary | ICD-10-CM | POA: Insufficient documentation

## 2015-12-02 DIAGNOSIS — Z8249 Family history of ischemic heart disease and other diseases of the circulatory system: Secondary | ICD-10-CM | POA: Diagnosis not present

## 2015-12-02 DIAGNOSIS — E785 Hyperlipidemia, unspecified: Secondary | ICD-10-CM | POA: Diagnosis not present

## 2015-12-02 DIAGNOSIS — R0609 Other forms of dyspnea: Secondary | ICD-10-CM | POA: Insufficient documentation

## 2015-12-02 DIAGNOSIS — I359 Nonrheumatic aortic valve disorder, unspecified: Secondary | ICD-10-CM | POA: Insufficient documentation

## 2015-12-02 DIAGNOSIS — R0600 Dyspnea, unspecified: Secondary | ICD-10-CM | POA: Diagnosis not present

## 2015-12-02 DIAGNOSIS — R5383 Other fatigue: Secondary | ICD-10-CM | POA: Insufficient documentation

## 2015-12-02 LAB — MYOCARDIAL PERFUSION IMAGING
CHL CUP NUCLEAR SRS: 0
CHL CUP NUCLEAR SSS: 3
CSEPPHR: 74 {beats}/min
LVDIAVOL: 68 mL (ref 46–106)
LVSYSVOL: 21 mL
NUC STRESS TID: 1.15
Rest HR: 55 {beats}/min
SDS: 3

## 2015-12-02 LAB — ECHOCARDIOGRAM COMPLETE
HEIGHTINCHES: 62 in
Weight: 2432 oz

## 2015-12-02 MED ORDER — TECHNETIUM TC 99M SESTAMIBI GENERIC - CARDIOLITE
30.6000 | Freq: Once | INTRAVENOUS | Status: AC | PRN
  Administered 2015-12-02: 31 via INTRAVENOUS

## 2015-12-02 MED ORDER — REGADENOSON 0.4 MG/5ML IV SOLN
0.4000 mg | Freq: Once | INTRAVENOUS | Status: AC
  Administered 2015-12-02: 0.4 mg via INTRAVENOUS

## 2015-12-02 MED ORDER — TECHNETIUM TC 99M SESTAMIBI GENERIC - CARDIOLITE
10.8000 | Freq: Once | INTRAVENOUS | Status: AC | PRN
Start: 2015-12-02 — End: 2015-12-02
  Administered 2015-12-02: 11 via INTRAVENOUS

## 2015-12-06 ENCOUNTER — Encounter: Payer: Self-pay | Admitting: Cardiovascular Disease

## 2015-12-06 NOTE — Telephone Encounter (Signed)
Follow Up:; ° ° °Returning your call. °

## 2015-12-06 NOTE — Telephone Encounter (Signed)
This encounter was created in error - please disregard.

## 2015-12-07 ENCOUNTER — Ambulatory Visit
Admission: RE | Admit: 2015-12-07 | Discharge: 2015-12-07 | Disposition: A | Discharge status: Home / Self Care | Payer: PPO | Source: Ambulatory Visit | Attending: Family Medicine | Admitting: Family Medicine

## 2015-12-07 ENCOUNTER — Other Ambulatory Visit: Payer: Self-pay | Admitting: Family Medicine

## 2015-12-07 DIAGNOSIS — R928 Other abnormal and inconclusive findings on diagnostic imaging of breast: Secondary | ICD-10-CM

## 2015-12-07 DIAGNOSIS — R920 Mammographic microcalcification found on diagnostic imaging of breast: Secondary | ICD-10-CM | POA: Diagnosis not present

## 2015-12-07 DIAGNOSIS — N6012 Diffuse cystic mastopathy of left breast: Secondary | ICD-10-CM | POA: Diagnosis not present

## 2015-12-19 ENCOUNTER — Other Ambulatory Visit: Payer: Self-pay | Admitting: Family Medicine

## 2015-12-19 ENCOUNTER — Other Ambulatory Visit: Payer: Self-pay | Admitting: Family

## 2015-12-21 ENCOUNTER — Ambulatory Visit (INDEPENDENT_AMBULATORY_CARE_PROVIDER_SITE_OTHER): Payer: PPO | Admitting: Family Medicine

## 2015-12-21 ENCOUNTER — Ambulatory Visit (INDEPENDENT_AMBULATORY_CARE_PROVIDER_SITE_OTHER)
Admission: RE | Admit: 2015-12-21 | Discharge: 2015-12-21 | Disposition: A | Discharge status: Home / Self Care | Payer: PPO | Source: Ambulatory Visit | Attending: Family Medicine | Admitting: Family Medicine

## 2015-12-21 ENCOUNTER — Encounter: Payer: Self-pay | Admitting: Family Medicine

## 2015-12-21 ENCOUNTER — Other Ambulatory Visit: Payer: Self-pay | Admitting: Family Medicine

## 2015-12-21 VITALS — BP 138/70 | HR 61 | Temp 98.1°F | Wt 147.0 lb

## 2015-12-21 DIAGNOSIS — Z20828 Contact with and (suspected) exposure to other viral communicable diseases: Secondary | ICD-10-CM | POA: Diagnosis not present

## 2015-12-21 DIAGNOSIS — E785 Hyperlipidemia, unspecified: Secondary | ICD-10-CM | POA: Diagnosis not present

## 2015-12-21 DIAGNOSIS — Z780 Asymptomatic menopausal state: Secondary | ICD-10-CM

## 2015-12-21 DIAGNOSIS — F32A Depression, unspecified: Secondary | ICD-10-CM

## 2015-12-21 DIAGNOSIS — F329 Major depressive disorder, single episode, unspecified: Secondary | ICD-10-CM | POA: Diagnosis not present

## 2015-12-21 DIAGNOSIS — I10 Essential (primary) hypertension: Secondary | ICD-10-CM | POA: Diagnosis not present

## 2015-12-21 DIAGNOSIS — M8580 Other specified disorders of bone density and structure, unspecified site: Secondary | ICD-10-CM

## 2015-12-21 LAB — COMPREHENSIVE METABOLIC PANEL
ALBUMIN: 4 g/dL (ref 3.5–5.2)
ALT: 29 U/L (ref 0–35)
AST: 25 U/L (ref 0–37)
Alkaline Phosphatase: 54 U/L (ref 39–117)
BUN: 11 mg/dL (ref 6–23)
CALCIUM: 9.7 mg/dL (ref 8.4–10.5)
CHLORIDE: 99 meq/L (ref 96–112)
CO2: 32 meq/L (ref 19–32)
CREATININE: 0.62 mg/dL (ref 0.40–1.20)
GFR: 102.14 mL/min (ref >60.00)
Glucose, Bld: 110 mg/dL — ABNORMAL HIGH (ref 70–99)
POTASSIUM: 5.1 meq/L (ref 3.5–5.1)
Sodium: 135 mEq/L (ref 135–145)
Total Bilirubin: 0.3 mg/dL (ref 0.2–1.2)
Total Protein: 6.7 g/dL (ref 6.0–8.3)

## 2015-12-21 LAB — LDL CHOLESTEROL, DIRECT: Direct LDL: 64 mg/dL

## 2015-12-21 LAB — CBC
HEMATOCRIT: 37.7 % (ref 36.0–46.0)
HEMOGLOBIN: 12.3 g/dL (ref 12.0–15.0)
MCHC: 32.5 g/dL (ref 30.0–36.0)
MCV: 88.8 fl (ref 78.0–100.0)
PLATELETS: 263 10*3/uL (ref 150.0–400.0)
RBC: 4.25 Mil/uL (ref 3.87–5.11)
RDW: 16.1 % — ABNORMAL HIGH (ref 11.5–15.5)
WBC: 6.2 10*3/uL (ref 4.0–10.5)

## 2015-12-21 MED ORDER — ZOSTER VACCINE LIVE 19400 UNT/0.65ML ~~LOC~~ SOLR: 0.6500 mL | Freq: Once | SUBCUTANEOUS | Status: DC

## 2015-12-21 NOTE — Assessment & Plan Note (Signed)
S: controlled on Propranolol 20mg  BP Readings from Last 3 Encounters:  12/21/15 138/70  11/25/15 120/80  11/21/15 124/78  A/P: no changes

## 2015-12-21 NOTE — Progress Notes (Signed)
Garret Reddish, MD  Subjective:  Katherine Foley is a 65 y.o. year old very pleasant female patient who presents for/with See problem oriented charting ROS- has some SOB due to SOB but no worsening- last albuterol use 3 months ago. No chest pain. No headache or blurry vision. No edema.   Past Medical History-  Patient Active Problem List   Diagnosis Date Noted  . Depression 09/22/2015    Priority: High  . Former smoker 09/22/2015    Priority: High  . S/P AVR (aortic valve replacement) 11/06/2013    Priority: High  . Aortic stenosis 07/07/2010    Priority: High  . CAD, NATIVE VESSEL 05/17/2010    Priority: High  . COPD GOLD II  07/23/2007    Priority: High  . Diastolic dysfunction 123456    Priority: Medium  . Carotid stenosis 05/17/2010    Priority: Medium  . HLD (hyperlipidemia) 08/29/2009    Priority: Medium  . Essential hypertension 04/14/2007    Priority: Medium  . Osteopenia 04/14/2007    Priority: Medium  . Allergic rhinitis 09/22/2015    Priority: Low  . Adenomatous colon polyp     Priority: Low  . GERD (gastroesophageal reflux disease) 06/04/2014    Priority: Low  . CAP (community acquired pneumonia)   . Herpes simplex virus (HSV) infection 01/16/2010    Medications- reviewed and updated Current Outpatient Prescriptions  Medication Sig Dispense Refill  . aspirin 81 MG tablet Take 81 mg by mouth daily.      Marland Kitchen BIOTIN 5000 PO Take 1 capsule by mouth daily.    Marland Kitchen lovastatin (MEVACOR) 20 MG tablet Take 1 tablet (20 mg total) by mouth at bedtime. 30 tablet 11  . PRISTIQ 100 MG 24 hr tablet TAKE ONE TABLET BY MOUTH ONCE DAILY 30 tablet 5  . propranolol (INDERAL) 20 MG tablet TAKE ONE TABLET BY MOUTH TWICE DAILY 60 tablet 3  . sertraline (ZOLOFT) 50 MG tablet TAKE ONE TABLET BY MOUTH ONCE DAILY 30 tablet 5  . SPIRIVA HANDIHALER 18 MCG inhalation capsule INHALE ONE DOSE BY MOUTH ONCE DAILY 30 capsule 0  . albuterol (PROVENTIL HFA;VENTOLIN HFA) 108 (90 BASE) MCG/ACT  inhaler Inhale 2 puffs into the lungs every 6 (six) hours as needed. (Patient not taking: Reported on 12/21/2015) 1 Inhaler 1  . budesonide-formoterol (SYMBICORT) 160-4.5 MCG/ACT inhaler Inhale 2 puffs into the lungs 2 (two) times daily. 1 Inhaler 5  . zoster vaccine live, PF, (ZOSTAVAX) 16109 UNT/0.65ML injection Inject 19,400 Units into the skin once. 1 each 0  . [DISCONTINUED] Aclidinium Bromide (TUDORZA PRESSAIR) 400 MCG/ACT AEPB Inhale 1 puff into the lungs 2 (two) times daily. 1 each 11   No current facility-administered medications for this visit.    Objective: BP 138/70 mmHg  Pulse 61  Temp(Src) 98.1 F (36.7 C)  Wt 147 lb (66.679 kg) Gen: NAD, resting comfortably CV: RRR no murmurs rubs or gallops Lungs: CTAB no crackles, wheeze, rhonchi Abdomen: soft/nontender/nondistended/normal bowel sounds.   Ext: no edema Skin: warm, dry Neuro: grossly normal, moves all extremities, normal gait  Assessment/Plan:  HLD (hyperlipidemia) S: previously mild poorly controlled on crestor 40mg , ran out of insurance coverage and now on just lovastatin 20mg  daily. No myalgias.  Lab Results  Component Value Date   CHOL 157 12/09/2014   HDL 65.00 12/09/2014   LDLCALC 77 12/09/2014   LDLDIRECT 73.3 10/02/2010   TRIG 74.0 12/09/2014   CHOLHDL 2 12/09/2014   A/P: check lipids today, likely change back  to crestor 40mg   Depression S: much improved on continued Pristiq 100mg  24 hour, zoloft 50mg - had a life stressor which has resolved A/P: continue current medication, recognizing odd to be on pristiq and zoloft but has worked well for her   Essential hypertension S: controlled on Propranolol 20mg  BP Readings from Last 3 Encounters:  12/21/15 138/70  11/25/15 120/80  11/21/15 124/78  A/P: no changes    No Follow-up on file. Return precautions advised.   Orders Placed This Encounter  Procedures  . DG Bone Density    Standing Status: Future     Number of Occurrences:      Standing  Expiration Date: 02/19/2017    Order Specific Question:  Reason for Exam (SYMPTOM  OR DIAGNOSIS REQUIRED)    Answer:  post menopausal    Order Specific Question:  Preferred imaging location?    Answer:  [facility]  . LDL cholesterol, direct    [place]  . CBC    [place]  . Comprehensive metabolic panel    [place]  . Hepatitis C antibody, reflex    [facility]    Meds ordered this encounter  Medications  . zoster vaccine live, PF, (ZOSTAVAX) 13086 UNT/0.65ML injection    Sig: Inject 19,400 Units into the skin once.    Dispense:  1 each    Refill:  0   The duration of face-to-face time during this visit was 25 minutes. Greater than 50% of this time was spent in counseling, explanation of diagnosis, planning of further management, and/or coordination of care.

## 2015-12-21 NOTE — Assessment & Plan Note (Signed)
S: previously mild poorly controlled on crestor 40mg , ran out of insurance coverage and now on just lovastatin 20mg  daily. No myalgias.  Lab Results  Component Value Date   CHOL 157 12/09/2014   HDL 65.00 12/09/2014   LDLCALC 77 12/09/2014   LDLDIRECT 73.3 10/02/2010   TRIG 74.0 12/09/2014   CHOLHDL 2 12/09/2014   A/P: check lipids today, likely change back to crestor 40mg 

## 2015-12-21 NOTE — Patient Instructions (Addendum)
Check with your insurance about: 1. Cholesterol medicine- will they cover rosuvastatin, crestor, atorvastatin or lipitor 2. Shingles vaccine- you can go get this done with prescription I gave you if they cover it at a pharmacy- if they prefer it to be done here we can do at next visit  Schedule your bone density test at check out desk  Consider the lung cancer screening program we discussed  Labs before you leave

## 2015-12-21 NOTE — Assessment & Plan Note (Signed)
S: much improved on continued Pristiq 100mg  24 hour, zoloft 50mg - had a life stressor which has resolved A/P: continue current medication, recognizing odd to be on pristiq and zoloft but has worked well for her

## 2015-12-22 ENCOUNTER — Telehealth: Payer: Self-pay | Admitting: Family Medicine

## 2015-12-22 LAB — HEPATITIS C ANTIBODY: HCV Ab: NEGATIVE

## 2015-12-22 NOTE — Telephone Encounter (Signed)
See below

## 2015-12-22 NOTE — Telephone Encounter (Signed)
Pt was to let Dr Yong Channel know which acid reflux her insurance will cover. They will cover omeprazole  40 mg     Or pantoprazole 40 mg   [facility]

## 2015-12-22 NOTE — Telephone Encounter (Signed)
Yes thanks- may send in omeprazole 40mg  #90 with 3 refills

## 2015-12-23 MED ORDER — OMEPRAZOLE 40 MG PO CPDR: 40.0000 mg | DELAYED_RELEASE_CAPSULE | Freq: Every day | ORAL | Status: DC

## 2015-12-23 NOTE — Telephone Encounter (Signed)
omeperazole sent in

## 2016-01-20 ENCOUNTER — Other Ambulatory Visit: Payer: Self-pay | Admitting: Family Medicine

## 2016-01-20 DIAGNOSIS — J449 Chronic obstructive pulmonary disease, unspecified: Secondary | ICD-10-CM

## 2016-01-27 ENCOUNTER — Encounter: Payer: Self-pay | Admitting: Pediatrics

## 2016-02-02 ENCOUNTER — Other Ambulatory Visit: Payer: Self-pay | Admitting: Family

## 2016-02-09 NOTE — Telephone Encounter (Signed)
PATIENT IS NO LONGER TAKING THIS MEDICATION DUE TO NO INSURANCE COVERAGE PER OV NOTE ON 12/21/2015

## 2016-02-23 ENCOUNTER — Other Ambulatory Visit: Payer: Self-pay | Admitting: Family Medicine

## 2016-03-17 ENCOUNTER — Other Ambulatory Visit: Payer: Self-pay | Admitting: Family Medicine

## 2016-03-22 ENCOUNTER — Encounter: Payer: Self-pay | Admitting: Family Medicine

## 2016-03-22 ENCOUNTER — Ambulatory Visit: Payer: PPO | Admitting: Family Medicine

## 2016-03-22 ENCOUNTER — Ambulatory Visit (INDEPENDENT_AMBULATORY_CARE_PROVIDER_SITE_OTHER): Payer: PPO | Admitting: Family Medicine

## 2016-03-22 VITALS — BP 120/70 | HR 64 | Temp 98.1°F | Ht 62.0 in | Wt 150.0 lb

## 2016-03-22 DIAGNOSIS — Z23 Encounter for immunization: Secondary | ICD-10-CM

## 2016-03-22 DIAGNOSIS — F329 Major depressive disorder, single episode, unspecified: Secondary | ICD-10-CM | POA: Diagnosis not present

## 2016-03-22 DIAGNOSIS — N951 Menopausal and female climacteric states: Secondary | ICD-10-CM

## 2016-03-22 DIAGNOSIS — E785 Hyperlipidemia, unspecified: Secondary | ICD-10-CM | POA: Diagnosis not present

## 2016-03-22 DIAGNOSIS — F32A Depression, unspecified: Secondary | ICD-10-CM

## 2016-03-22 DIAGNOSIS — Z0000 Encounter for general adult medical examination without abnormal findings: Secondary | ICD-10-CM

## 2016-03-22 DIAGNOSIS — R232 Flushing: Secondary | ICD-10-CM

## 2016-03-22 DIAGNOSIS — I10 Essential (primary) hypertension: Secondary | ICD-10-CM

## 2016-03-22 DIAGNOSIS — J449 Chronic obstructive pulmonary disease, unspecified: Secondary | ICD-10-CM | POA: Diagnosis not present

## 2016-03-22 MED ORDER — GABAPENTIN 100 MG PO CAPS: 100.0000 mg | ORAL_CAPSULE | Freq: Every day | ORAL | Status: DC

## 2016-03-22 MED ORDER — ALBUTEROL SULFATE HFA 108 (90 BASE) MCG/ACT IN AERS: 2.0000 | INHALATION_SPRAY | Freq: Four times a day (QID) | RESPIRATORY_TRACT | Status: DC | PRN

## 2016-03-22 NOTE — Addendum Note (Signed)
Addended by: Jerl Santos R on: 03/22/2016 11:23 AM   Modules accepted: Orders, SmartSet

## 2016-03-22 NOTE — Progress Notes (Signed)
Pre visit review using our clinic review tool, if applicable. No additional management support is needed unless otherwise documented below in the visit note. 

## 2016-03-22 NOTE — Assessment & Plan Note (Signed)
S: controlled on propranolol 20mg   BP Readings from Last 3 Encounters:  03/22/16 120/70  12/21/15 138/70  11/25/15 120/80  A/P:Continue current meds:  Doing well

## 2016-03-22 NOTE — Assessment & Plan Note (Signed)
S: 28 years of symptoms reportedly. Came off hormone therapy about 8 months ago and symptoms worsened. Usually rush of heat every 15 minutes including overnight making sleep tough. Has tried OTC medications without relief.  A/P: trial gabapentin 100mg  qhs to at least hepl with sleep- may try 100mg  BID at next visit if helpful- though suspect will have greater benefit at night

## 2016-03-22 NOTE — Progress Notes (Signed)
Phone: [phone]  Subjective:  Patient presents today for their annual wellness visit.    Preventive Screening-Counseling & Management  Smoking Status: Former Smoker, quit 2007. Over 60 pack years smoking. wants to think about lung cancer screening- declines for now Second Hand Smoking status: No smokers in home  Risk Factors Regular exercise: walks most days for at least 2 miles.  Diet: reasonable, weight stable. Low on meats and fried foods.   Fall Risk: None  Fall Risk  03/22/2016 06/24/2015 06/14/2014  Falls in the past year? No No No    Cardiac risk factors:  Known CAD advanced age (older than 48 for men, 52 for women)  Hyperlipidemia - cholesterol has been controlled on crestor 40mg  No diabetes.  HTN- controlled on propranolol alone   Depression Screen None. PHQ2 0  On pristiq and zoloft Depression screen [facility] 2/9 03/22/2016 06/24/2015 06/14/2014  Decreased Interest 0 0 0  Down, Depressed, Hopeless 0 0 0  PHQ - 2 Score 0 0 0    Activities of Daily Living Independent ADLs and IADLs   Hearing Difficulties: -patient declines  Cognitive Testing No reported trouble.   Normal 3 word recall  List the Names of Other Physician/Practitioners you currently use: -Dr. Burt Knack [facility] -Dr. Melvyn Novas prn follow up COPD  Immunization History  Administered Date(s) Administered  . Influenza Split 06/19/2011, 07/02/2012  . Influenza Whole 08/03/2009  . Influenza, High Dose Seasonal PF 05/20/2015  . Influenza,inj,Quad PF,36+ Mos 05/21/2013, 05/14/2014  . Pneumococcal Conjugate-13 06/14/2014  . Td 08/29/2009  . Zoster 01/21/2016   Required Immunizations needed today - Pneumovax 23 today  Screening tests- up to date Declines lung cancer screening  Feels safe at home  Full code for now  ROS- No pertinent positives discovered in course of AWV ROS pertinent- No chest pain or shortness of breath. No headache or blurry vision. Does have hot flashes  The  following were reviewed and entered/updated in epic: Past Medical History  Diagnosis Date  . AORTIC STENOSIS 07/07/2010    a. echo 12/23/10: EF 60-65%, mod to severe AS with mean gradient 29 mmHg;  b. 04/2011 s/p bioprosthetic AVR.  Marland Kitchen Barrett's esophagus 04/14/2007  . CAD, NATIVE VESSEL 05/17/2010    a. NSTEMI 8/11 - BMS to RCA;  b. cath 4/12: dLAD 50%, RI 80% (PCI), AVCFX 30%, pRCA 30%, stent ok, 40-50, then 50% dist RCA;  c. s/p Promus DES to RI 12/25/10;  d. 04/2011 CABG x 1 (LIMA->LAD) @ time of AVR  . Carotid stenosis 05/17/2010    a. dopplers Q000111Q: RICA Q000111Q; LICA [id number] (follow up due 11/12)  . 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 Herpes simplex without mention of complication [id number]  . [place], MILD 08/29/2009  . OSTEOPENIA 04/14/2007  . URINARY INCONTINENCE, STRESS, MILD 08/26/2007  . GERD (gastroesophageal reflux disease)   . Ovarian cyst   . Urine, incontinence, stress female   . Adenomatous colon polyp   . HYPERTENSION 04/14/2007  . Myocardial infarction [facility]) 2011    "during catherization" (03/25/2013)  . Hemorrhoid thrombosis     "had it lanced" (03/25/2013)  . Pneumonia     "multiple times" (03/25/2013)  . Chronic bronchitis ([place])     "multiple times" (03/25/2013)  . Exertional shortness of breath     "related to COPD problems" (03/25/2013)  . History of blood transfusion     'w/OHS" (03/25/2013)  . H/O hiatal hernia   . MIGRAINE HEADACHE 04/20/2008  . ANXIETY DEPRESSION 02/15/2009  .  Barrett's esophagus 2000   Patient Active Problem List   Diagnosis Date Noted  . Depression 09/22/2015    Priority: High  . Former smoker 09/22/2015    Priority: High  . S/P AVR (aortic valve replacement) 11/06/2013    Priority: High  . Aortic stenosis 07/07/2010    Priority: High  . CAD, NATIVE VESSEL 05/17/2010    Priority: High  . COPD GOLD II  07/23/2007    Priority: High  . Hot flashes 03/22/2016    Priority: Medium  . Diastolic dysfunction  123456    Priority: Medium  . Carotid stenosis 05/17/2010    Priority: Medium  . HLD (hyperlipidemia) 08/29/2009    Priority: Medium  . Essential hypertension 04/14/2007    Priority: Medium  . Osteopenia 04/14/2007    Priority: Medium  . Allergic rhinitis 09/22/2015    Priority: Low  . Adenomatous colon polyp     Priority: Low  . GERD (gastroesophageal reflux disease) 06/04/2014    Priority: Low  . CAP (community acquired pneumonia)   . Herpes simplex virus (HSV) infection 01/16/2010   Past Surgical History  Procedure Laterality Date  . Tonsillectomy and adenoidectomy    . Nasal sinus surgery  1997; 1998    "cust in maxillary sinus; put a stent in then removed it" (03/25/2013)  . Blepharoplasty Bilateral ~ 1998  . Thrombosed hemorrohid lanced    . Ovarian cyst removal Right 2011    "size of a soccer ball" (03/25/2013)  . Appendectomy    . Total abdominal hysterectomy    . Aortic valve replacement  2012  . Coronary angioplasty with stent placement  2010    "I have 2 stents" (03/25/2013)  . Cholecystectomy N/A 03/25/2013    Procedure: LAPAROSCOPIC CHOLECYSTECTOMY WITH INTRAOPERATIVE CHOLANGIOGRAM;  Surgeon: Imogene Burn. Georgette Dover, MD;  Location: [facility];  Service: General;  Laterality: N/A;    Family History  Problem Relation Age of Onset  . Coronary artery disease Other   . Heart disease Other     6 stents  . Heart disease Mother   . COPD Father     smoked  . Heart disease Father   . Heart disease Maternal Uncle   . Breast cancer Daughter   . Colon cancer Neg Hx   . Colon polyps Neg Hx   . Asthma Son     as a child   . Rheum arthritis Daughter   . Rheum arthritis Father   . Breast cancer Daughter     Medications- reviewed and updated Current Outpatient Prescriptions  Medication Sig Dispense Refill  . albuterol (PROVENTIL HFA;VENTOLIN HFA) 108 (90 BASE) MCG/ACT inhaler Inhale 2 puffs into the lungs every 6 (six) hours as needed. 1 Inhaler 1  . aspirin 81 MG tablet  Take 81 mg by mouth daily.      Marland Kitchen BIOTIN 5000 PO Take 1 capsule by mouth daily.    Marland Kitchen lovastatin (MEVACOR) 20 MG tablet Take 1 tablet (20 mg total) by mouth at bedtime. 30 tablet 11  . omeprazole (PRILOSEC) 40 MG capsule Take 1 capsule (40 mg total) by mouth daily. 90 capsule 3  . PRISTIQ 100 MG 24 hr tablet TAKE ONE TABLET BY MOUTH ONCE DAILY 30 tablet 5  . propranolol (INDERAL) 20 MG tablet TAKE ONE TABLET BY MOUTH TWICE DAILY 60 tablet 5  . rosuvastatin (CRESTOR) 40 MG tablet daily.    . sertraline (ZOLOFT) 50 MG tablet TAKE ONE TABLET BY MOUTH ONCE DAILY 30 tablet 5  .  SPIRIVA HANDIHALER 18 MCG inhalation capsule INHALE ONE DOSE BY MOUTH ONCE DAILY. 30 capsule 3  . SYMBICORT 160-4.5 MCG/ACT inhaler INHALE TWO PUFFS INTO THE LUNGS TWICE DAILY 1 Inhaler 11  . zoster vaccine live, PF, (ZOSTAVAX) 40981 UNT/0.65ML injection Inject 19,400 Units into the skin once. 1 each 0  . [DISCONTINUED] Aclidinium Bromide (TUDORZA PRESSAIR) 400 MCG/ACT AEPB Inhale 1 puff into the lungs 2 (two) times daily. 1 each 11   No current facility-administered medications for this visit.    Allergies-reviewed and updated Allergies  Allergen Reactions  . Codeine Sulfate Rash    REACTION: unspecified  . Hydrocodone-Acetaminophen Rash    REACTION: unspecified  . Amoxicillin-Pot Clavulanate     REACTION: unspecified    Social History   Social History  . Marital Status: Single    Spouse Name: N/A  . Number of Children: 3  . Years of Education: N/A   Occupational History  . [place] History Main Topics  . Smoking status: Former Smoker -- 1.50 packs/day for 43 years    Types: Cigarettes    Quit date: 09/03/2005  . Smokeless tobacco: Never Used  . Alcohol Use: 0.0 oz/week    0 Standard drinks or equivalent per week  . Drug Use: No  . Sexual Activity: Not Asked   Other Topics Concern  . None   Social History Narrative   Prolonged engagement 17 years in 2017. 3 children. 6 grandkids.  2 greatgrandkids. Keeps greatgrandson on tuesdays- loves her so much.       Retired from [employer] (parttime- Dec 12)   Faith is very important to her      Hobbies: reading, cross stich, sew. Wants to learn to knit.           Objective: BP 120/70 mmHg  Pulse 64  Temp(Src) 98.1 F (36.7 C) (Oral)  Ht 5\' 2"  (1.575 m)  Wt 150 lb (68.04 kg)  BMI 27.43 kg/m2  SpO2 97% Gen: NAD, resting comfortably, appears stated age HEENT: Mucous membranes are moist. Oropharynx normal. TM normal CV: RRR no murmurs rubs or gallops Lungs: CTAB no crackles, wheeze, rhonchi Abdomen: soft/nontender/nondistended/normal bowel sounds. No rebound or guarding.  Ext: no edema Skin: warm, dry Neuro: grossly normal, moves all extremities, PERRLA  Assessment/Plan:  AWV completed- discussed recommended screenings anddocumented any personalized health advice and referrals for preventive counseling. See AVS as well which was given to patient.   Status of chronic or acute concerns   Hot flashes S: 28 years of symptoms reportedly. Came off hormone therapy about 8 months ago and symptoms worsened. Usually rush of heat every 15 minutes including overnight making sleep tough. Has tried OTC medications without relief.  A/P: trial gabapentin 100mg  qhs to at least hepl with sleep- may try 100mg  BID at next visit if helpful- though suspect will have greater benefit at night   Depression S: controlled on Pristiq 100mg  24 hour, zoloft 50mg  with phq2 of 0 A/P: continue current meds. Would prefer to get off zoloft but within the year has had some worsening symptoms even on this regimen though better today- will not change   COPD GOLD II  S: stable on symbicort and spiriva- rare use of albuterol A/P: refill albuterol as has expired, continue symbicort and spiriva   HLD (hyperlipidemia) S: well controlled on crestor 40mg  generic. No myalgias.  Lab Results  Component Value Date   CHOL 157 12/09/2014   HDL 65.00  12/09/2014   LDLCALC 77  12/09/2014   LDLDIRECT 64.0 12/21/2015   TRIG 74.0 12/09/2014   CHOLHDL 2 12/09/2014   A/P: doing well, no changes    Essential hypertension S: controlled on propranolol 20mg   BP Readings from Last 3 Encounters:  03/22/16 120/70  12/21/15 138/70  11/25/15 120/80  A/P:Continue current meds:  Doing well     Return in about 6 months (around 09/22/2016).   Pneumovax 23 being given as note typed  Meds ordered this encounter  Medications  . rosuvastatin (CRESTOR) 40 MG tablet    Sig: daily.  Marland Kitchen albuterol (PROVENTIL HFA;VENTOLIN HFA) 108 (90 Base) MCG/ACT inhaler    Sig: Inhale 2 puffs into the lungs every 6 (six) hours as needed.    Dispense:  1 Inhaler    Refill:  1  . gabapentin (NEURONTIN) 100 MG capsule    Sig: Take 1 capsule (100 mg total) by mouth at bedtime.    Dispense:  30 capsule    Refill:  5    Return precautions advised.  Garret Reddish, MD

## 2016-03-22 NOTE — Patient Instructions (Addendum)
  [name] , Thank you for taking time to come for your Medicare Wellness Visit. I appreciate your ongoing commitment to your health goals. Please review the following plan we discussed and let me know if I can assist you in the future.   These are the goals we discussed: 1. Pneumovax 23 today 2. Try gabapentin 100mg  each night- may reduce hot flash intensity, may help you sleep. 3. Follow up 6 months   This is a list of the screening recommended for you and due dates:  Health Maintenance  Topic Date Due  . Pneumonia vaccines (2 of 2 - PPSV23) 09/21/2016*  . Flu Shot  04/03/2016  . Mammogram  12/06/2017  . Tetanus Vaccine  08/30/2019  . Colon Cancer Screening  07/14/2024  . DEXA scan (bone density measurement)  Completed  . Shingles Vaccine  Completed  .  Hepatitis C: One time screening is recommended by Center for Disease Control  (CDC) for  adults born from [birth year] through 1967.   Completed  *Topic was postponed. The date shown is not the original due date.

## 2016-03-22 NOTE — Assessment & Plan Note (Signed)
S: controlled on Pristiq 100mg  24 hour, zoloft 50mg  with phq2 of 0 A/P: continue current meds. Would prefer to get off zoloft but within the year has had some worsening symptoms even on this regimen though better today- will not change

## 2016-03-22 NOTE — Assessment & Plan Note (Signed)
S: well controlled on crestor 40mg  generic. No myalgias.  Lab Results  Component Value Date   CHOL 157 12/09/2014   HDL 65.00 12/09/2014   LDLCALC 77 12/09/2014   LDLDIRECT 64.0 12/21/2015   TRIG 74.0 12/09/2014   CHOLHDL 2 12/09/2014   A/P: doing well, no changes

## 2016-03-22 NOTE — Assessment & Plan Note (Signed)
S: stable on symbicort and spiriva- rare use of albuterol A/P: refill albuterol as has expired, continue symbicort and spiriva

## 2016-04-12 IMAGING — MG MM DIAGNOSTIC UNILATERAL L
2 series · 2 of 2 positions shown · non-contrast
Comparison: 11/11/2015.

CLINICAL DATA: Patient was called back from screening mammogram for
left breast calcifications.

EXAM:
DIGITAL DIAGNOSTIC LEFT MAMMOGRAM

[L CC]
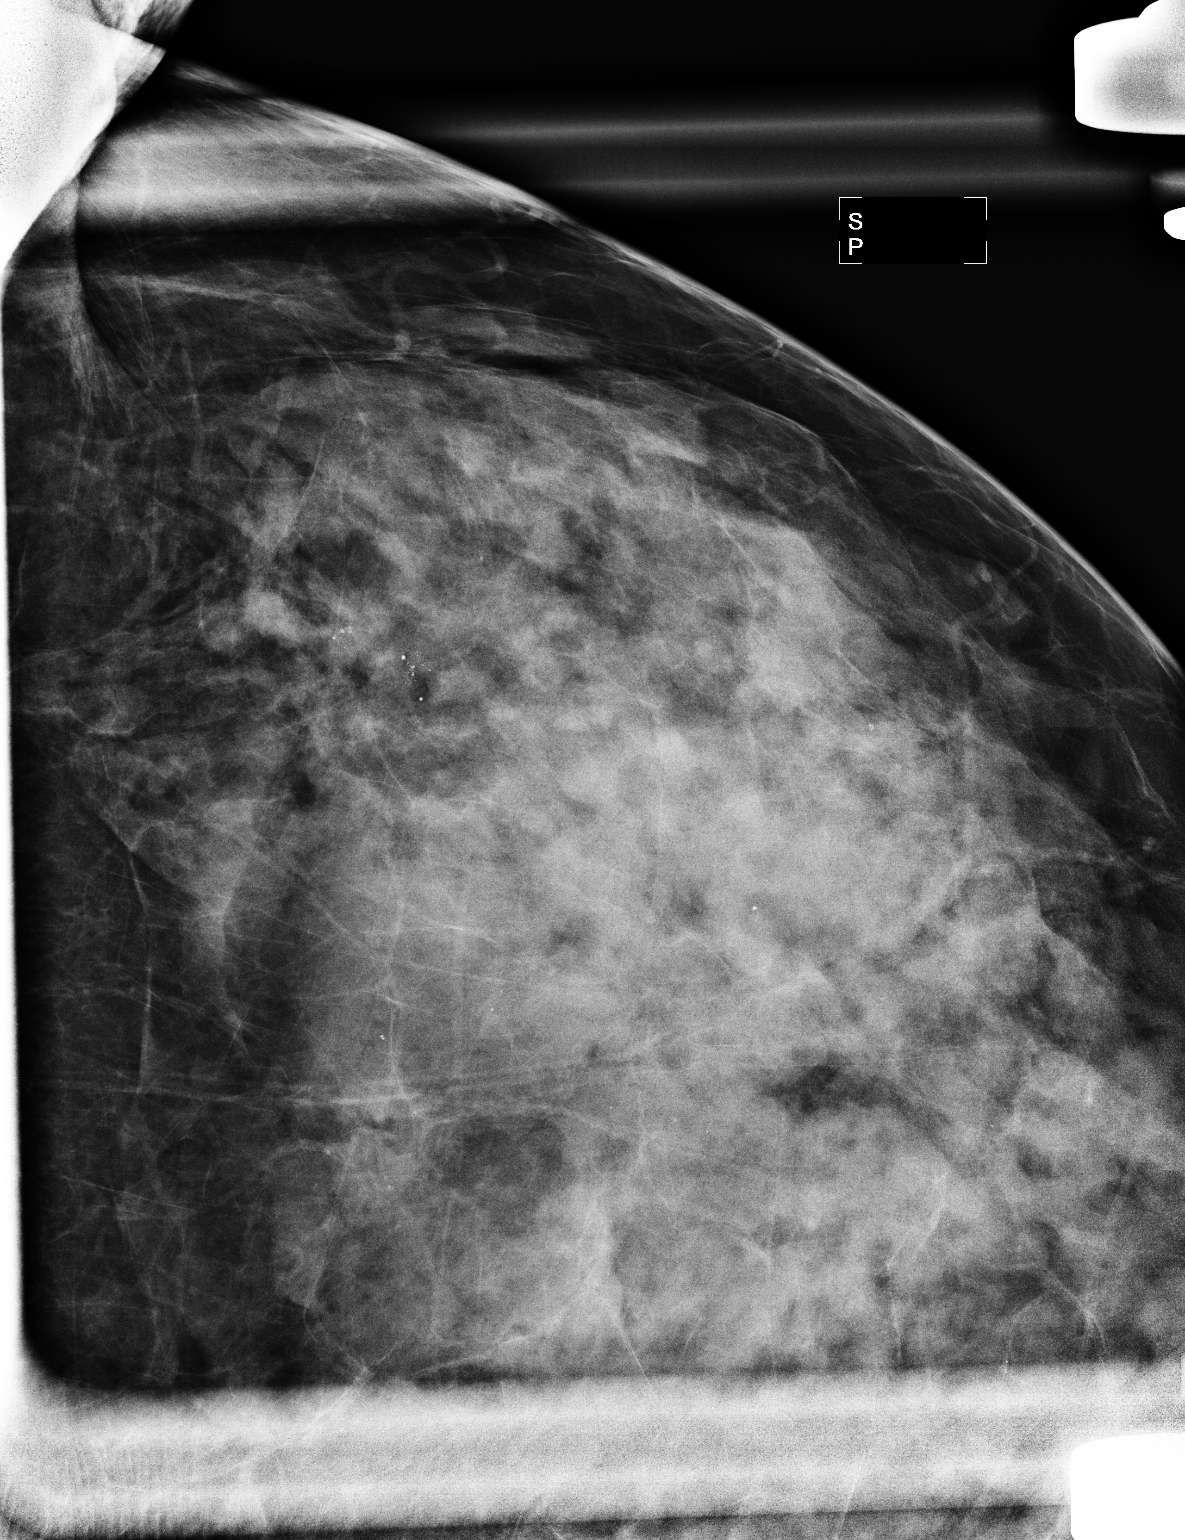

[L ML]
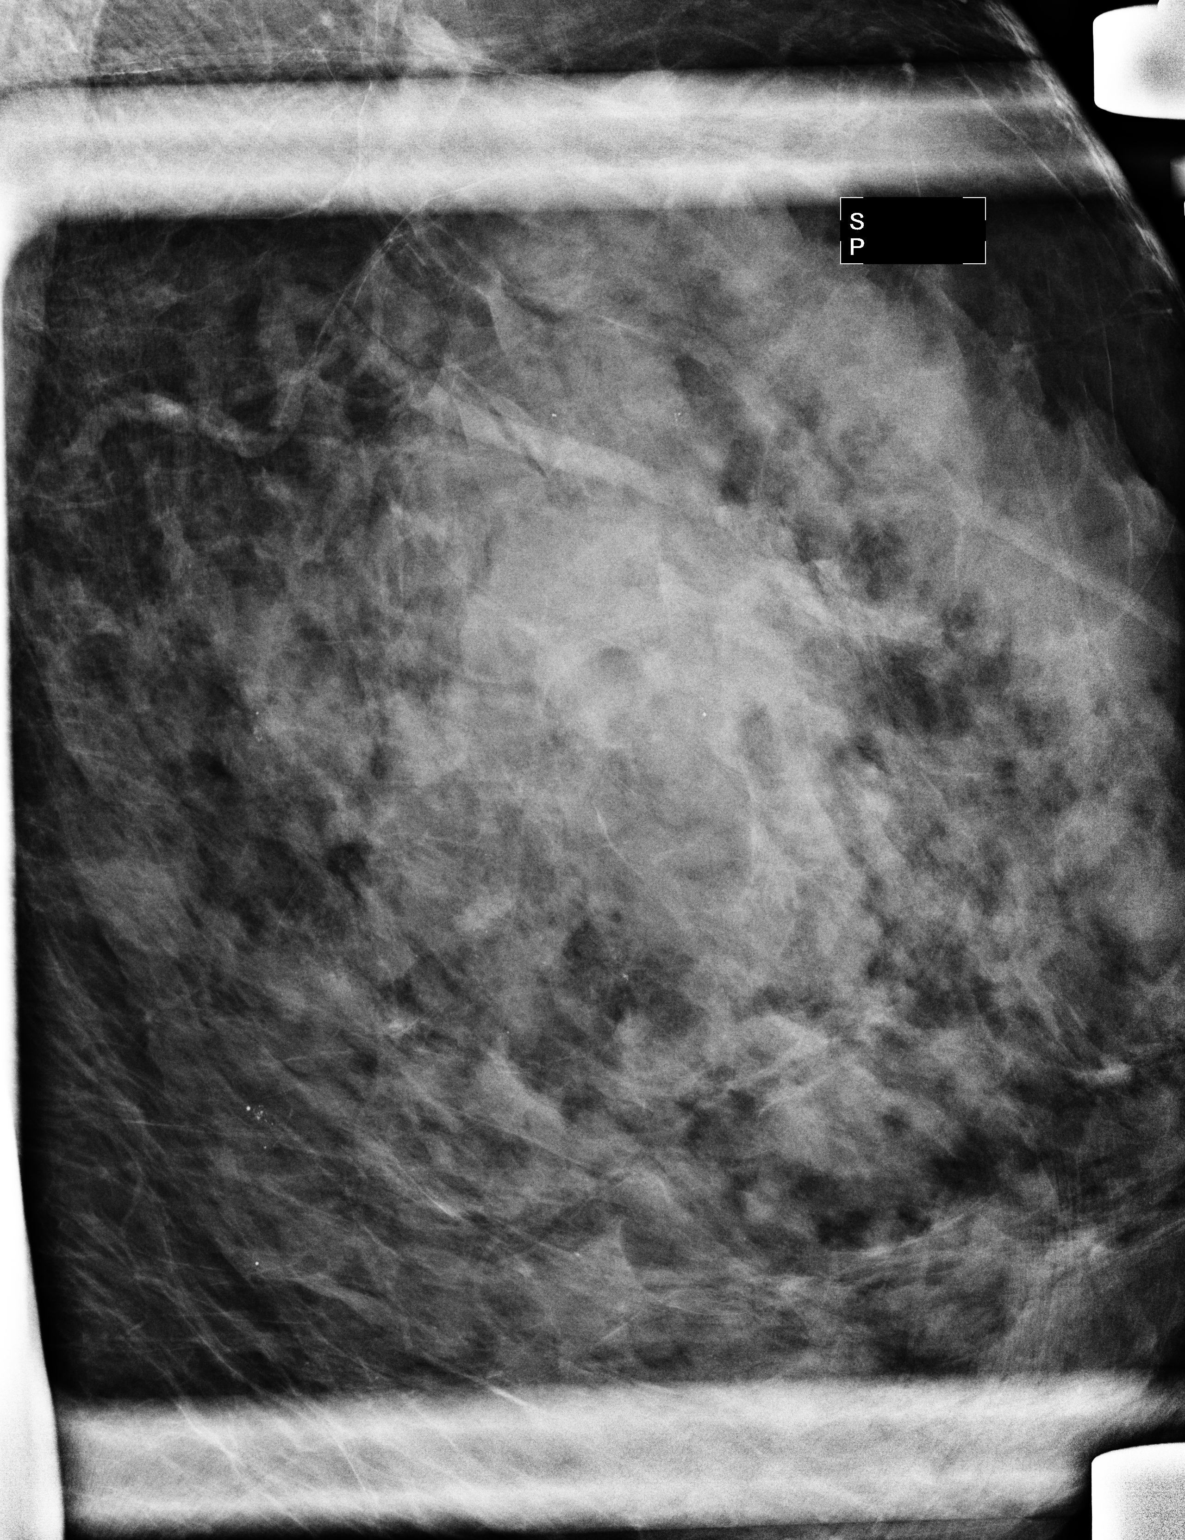

[2 of 2 positions shown; findings below may reference images not displayed]

No additional priors available for
comparison.

ACR Breast Density Category c: The breast tissue is heterogeneously
dense, which may obscure small masses.
FINDINGS: Additional imaging of the left breast was performed. There are
coarse heterogeneous grouped calcifications in the lateral aspect of
the left breast spanning an area of 3 mm. They are indeterminate.
There is no suspicious mass.
IMPRESSION: Indeterminate calcifications in the lateral aspect of the left
breast.

RECOMMENDATION:
Stereotactic biopsy of the left breast is recommended. This will be
scheduled at the patient's convenience.

I have discussed the findings and recommendations with the patient.
Results were also provided in writing at the conclusion of the
visit. If applicable, a reminder letter will be sent to the patient
regarding the next appointment.

BI-RADS CATEGORY  4: Suspicious.

## 2016-04-13 ENCOUNTER — Encounter: Payer: Self-pay | Admitting: Family Medicine

## 2016-04-13 ENCOUNTER — Ambulatory Visit (INDEPENDENT_AMBULATORY_CARE_PROVIDER_SITE_OTHER): Payer: PPO | Admitting: Family Medicine

## 2016-04-13 VITALS — BP 136/76 | HR 66 | Temp 97.9°F | Wt 150.4 lb

## 2016-04-13 DIAGNOSIS — J441 Chronic obstructive pulmonary disease with (acute) exacerbation: Secondary | ICD-10-CM

## 2016-04-13 MED ORDER — PREDNISONE 20 MG PO TABS: ORAL_TABLET | ORAL | 0 refills | Status: DC

## 2016-04-13 MED ORDER — DOXYCYCLINE HYCLATE 100 MG PO TABS: 100.0000 mg | ORAL_TABLET | Freq: Two times a day (BID) | ORAL | 0 refills | Status: DC

## 2016-04-13 NOTE — Progress Notes (Signed)
Pre visit review using our clinic review tool, if applicable. No additional management support is needed unless otherwise documented below in the visit note. 

## 2016-04-13 NOTE — Progress Notes (Signed)
Subjective:  KIIRA DOMINO is a 65 y.o. year old very pleasant female patient who presents for/with See problem oriented charting ROS- see any ROS included in HPI as well.   Past Medical History-  Patient Active Problem List   Diagnosis Date Noted  . Depression 09/22/2015    Priority: High  . Former smoker 09/22/2015    Priority: High  . S/P AVR (aortic valve replacement) 11/06/2013    Priority: High  . Aortic stenosis 07/07/2010    Priority: High  . CAD, NATIVE VESSEL 05/17/2010    Priority: High  . COPD GOLD II  07/23/2007    Priority: High  . Hot flashes 03/22/2016    Priority: Medium  . Diastolic dysfunction 123456    Priority: Medium  . Carotid stenosis 05/17/2010    Priority: Medium  . HLD (hyperlipidemia) 08/29/2009    Priority: Medium  . Essential hypertension 04/14/2007    Priority: Medium  . Osteopenia 04/14/2007    Priority: Medium  . Allergic rhinitis 09/22/2015    Priority: Low  . Adenomatous colon polyp     Priority: Low  . GERD (gastroesophageal reflux disease) 06/04/2014    Priority: Low  . CAP (community acquired pneumonia)   . Herpes simplex virus (HSV) infection 01/16/2010    Medications- reviewed and updated Current Outpatient Prescriptions  Medication Sig Dispense Refill  . albuterol (PROVENTIL HFA;VENTOLIN HFA) 108 (90 Base) MCG/ACT inhaler Inhale 2 puffs into the lungs every 6 (six) hours as needed. 1 Inhaler 1  . aspirin 81 MG tablet Take 81 mg by mouth daily.      Marland Kitchen BIOTIN 5000 PO Take 1 capsule by mouth daily.    Marland Kitchen gabapentin (NEURONTIN) 100 MG capsule Take 1 capsule (100 mg total) by mouth at bedtime. 30 capsule 5  . lovastatin (MEVACOR) 20 MG tablet Take 1 tablet (20 mg total) by mouth at bedtime. 30 tablet 11  . omeprazole (PRILOSEC) 40 MG capsule Take 1 capsule (40 mg total) by mouth daily. 90 capsule 3  . PRISTIQ 100 MG 24 hr tablet TAKE ONE TABLET BY MOUTH ONCE DAILY 30 tablet 5  . propranolol (INDERAL) 20 MG tablet TAKE ONE  TABLET BY MOUTH TWICE DAILY 60 tablet 5  . rosuvastatin (CRESTOR) 40 MG tablet daily.    . sertraline (ZOLOFT) 50 MG tablet TAKE ONE TABLET BY MOUTH ONCE DAILY 30 tablet 5  . SPIRIVA HANDIHALER 18 MCG inhalation capsule INHALE ONE DOSE BY MOUTH ONCE DAILY. 30 capsule 3  . SYMBICORT 160-4.5 MCG/ACT inhaler INHALE TWO PUFFS INTO THE LUNGS TWICE DAILY 1 Inhaler 11   No current facility-administered medications for this visit.     Objective: BP 136/76 (BP Location: Left Arm, Patient Position: Sitting, Cuff Size: Normal)   Pulse 66   Temp 97.9 F (36.6 C) (Oral)   Wt 150 lb 6.4 oz (68.2 kg)   SpO2 94%   BMI 27.51 kg/m  Gen: NAD, resting comfortably, appears fatigued CV: RRR no murmurs rubs or gallops Lungs: prominent diffuse wheeze, no obvious respiratory distress fortunately Ext: no edema Skin: warm, dry, no rash Neuro: grossly normal, moves all extremities, normal gait  Assessment/Plan:  COPD exacerbation S: started coughing on Monday- gradually progressive> wheezing heavily. Coughing up brown sputum (usually no production to clear). Denies obvious sick contacts but has seen people around her sick. Feels short of breath. Achy all over. Still taking symbicort and spiriva. Tried to push through- did not take albuterol.  Ros-  Denies nasal congestion,  runny nose until today- some sinus congestion. Achy in chest and upper back with cough- otherwise no pain A/P: COPD flare- treat with perdnisone and doxycycline per orders. Follow up per avs. Also verbally advised we could see each other Monday if not going in right direction  Meds ordered this encounter  Medications  . predniSONE (DELTASONE) 20 MG tablet    Sig: Take 2 pills for 3 days, 1 pill for 4 days    Dispense:  10 tablet    Refill:  0  . doxycycline (VIBRA-TABS) 100 MG tablet    Sig: Take 1 tablet (100 mg total) by mouth 2 (two) times daily.    Dispense:  14 tablet    Refill:  0    Return precautions advised.  Garret Reddish, MD

## 2016-04-13 NOTE — Patient Instructions (Signed)
Start prednisone and doxycycline for COPD exacerbation  If you are not improving within 24-48 hours or worsen- please seek care over weekend.   Take albuterol every 4 hours for at least 24 hours then as needed

## 2016-05-17 ENCOUNTER — Other Ambulatory Visit: Payer: Self-pay | Admitting: Family Medicine

## 2016-05-18 NOTE — Telephone Encounter (Signed)
Yes thanks, though considering stopping the zoloft since on pristiq

## 2016-06-22 ENCOUNTER — Encounter: Payer: Self-pay | Admitting: Family Medicine

## 2016-06-22 ENCOUNTER — Ambulatory Visit (INDEPENDENT_AMBULATORY_CARE_PROVIDER_SITE_OTHER): Payer: PPO | Admitting: Family Medicine

## 2016-06-22 VITALS — BP 130/86 | HR 65 | Temp 97.6°F | Wt 151.4 lb

## 2016-06-22 DIAGNOSIS — R200 Anesthesia of skin: Secondary | ICD-10-CM

## 2016-06-22 DIAGNOSIS — R51 Headache: Secondary | ICD-10-CM

## 2016-06-22 DIAGNOSIS — R202 Paresthesia of skin: Secondary | ICD-10-CM

## 2016-06-22 DIAGNOSIS — I2510 Atherosclerotic heart disease of native coronary artery without angina pectoris: Secondary | ICD-10-CM | POA: Diagnosis not present

## 2016-06-22 DIAGNOSIS — R209 Unspecified disturbances of skin sensation: Secondary | ICD-10-CM | POA: Diagnosis not present

## 2016-06-22 DIAGNOSIS — R519 Headache, unspecified: Secondary | ICD-10-CM

## 2016-06-22 LAB — CBC WITH DIFFERENTIAL/PLATELET
BASOS ABS: 0 10*3/uL (ref 0.0–0.1)
Basophils Relative: 0.4 % (ref 0.0–3.0)
EOS ABS: 0 10*3/uL (ref 0.0–0.7)
Eosinophils Relative: 0 % (ref 0.0–5.0)
HEMATOCRIT: 37.2 % (ref 36.0–46.0)
HEMOGLOBIN: 12.2 g/dL (ref 12.0–15.0)
LYMPHS PCT: 20.4 % (ref 12.0–46.0)
Lymphs Abs: 1.2 10*3/uL (ref 0.7–4.0)
MCHC: 32.8 g/dL (ref 30.0–36.0)
MCV: 88.4 fl (ref 78.0–100.0)
MONOS PCT: 11.5 % (ref 3.0–12.0)
Monocytes Absolute: 0.7 10*3/uL (ref 0.1–1.0)
Neutro Abs: 3.9 10*3/uL (ref 1.4–7.7)
Neutrophils Relative %: 67.7 % (ref 43.0–77.0)
Platelets: 204 10*3/uL (ref 150.0–400.0)
RBC: 4.21 Mil/uL (ref 3.87–5.11)
RDW: 15.4 % (ref 11.5–15.5)
WBC: 5.8 10*3/uL (ref 4.0–10.5)

## 2016-06-22 LAB — COMPREHENSIVE METABOLIC PANEL
ALBUMIN: 4.2 g/dL (ref 3.5–5.2)
ALK PHOS: 63 U/L (ref 39–117)
ALT: 22 U/L (ref 0–35)
AST: 20 U/L (ref 0–37)
BILIRUBIN TOTAL: 0.3 mg/dL (ref 0.2–1.2)
BUN: 18 mg/dL (ref 6–23)
CALCIUM: 9.4 mg/dL (ref 8.4–10.5)
CO2: 30 mEq/L (ref 19–32)
CREATININE: 0.64 mg/dL (ref 0.40–1.20)
Chloride: 99 mEq/L (ref 96–112)
GFR: 98.32 mL/min (ref >60.00)
Glucose, Bld: 95 mg/dL (ref 70–99)
Potassium: 4.8 mEq/L (ref 3.5–5.1)
Sodium: 134 mEq/L — ABNORMAL LOW (ref 135–145)
TOTAL PROTEIN: 6.7 g/dL (ref 6.0–8.3)

## 2016-06-22 LAB — TSH: TSH: 0.41 u[IU]/mL (ref 0.35–4.50)

## 2016-06-22 LAB — VITAMIN B12: Vitamin B-12: 550 pg/mL (ref 211–911)

## 2016-06-22 NOTE — Progress Notes (Signed)
Pre visit review using our clinic review tool, if applicable. No additional management support is needed unless otherwise documented below in the visit note. 

## 2016-06-22 NOTE — Progress Notes (Signed)
Subjective:  Katherine Foley is a 65 y.o. year old very pleasant female patient who presents for/with See problem oriented charting ROS- see  ROS included in HPI (just below)  Past Medical History-  Patient Active Problem List   Diagnosis Date Noted  . Depression 09/22/2015    Priority: High  . Former smoker 09/22/2015    Priority: High  . S/P AVR (aortic valve replacement) 11/06/2013    Priority: High  . Aortic stenosis 07/07/2010    Priority: High  . CAD, NATIVE VESSEL 05/17/2010    Priority: High  . COPD GOLD II  07/23/2007    Priority: High  . Hot flashes 03/22/2016    Priority: Medium  . Diastolic dysfunction 123456    Priority: Medium  . Carotid stenosis 05/17/2010    Priority: Medium  . HLD (hyperlipidemia) 08/29/2009    Priority: Medium  . Essential hypertension 04/14/2007    Priority: Medium  . Osteopenia 04/14/2007    Priority: Medium  . Allergic rhinitis 09/22/2015    Priority: Low  . Adenomatous colon polyp     Priority: Low  . GERD (gastroesophageal reflux disease) 06/04/2014    Priority: Low  . CAP (community acquired pneumonia)   . Herpes simplex virus (HSV) infection 01/16/2010    Medications- reviewed and updated Current Outpatient Prescriptions  Medication Sig Dispense Refill  . albuterol (PROVENTIL HFA;VENTOLIN HFA) 108 (90 Base) MCG/ACT inhaler Inhale 2 puffs into the lungs every 6 (six) hours as needed. 1 Inhaler 1  . aspirin 81 MG tablet Take 81 mg by mouth daily.      Marland Kitchen BIOTIN 5000 PO Take 1 capsule by mouth daily.    Marland Kitchen doxycycline (VIBRA-TABS) 100 MG tablet Take 1 tablet (100 mg total) by mouth 2 (two) times daily. 14 tablet 0  . gabapentin (NEURONTIN) 100 MG capsule Take 1 capsule (100 mg total) by mouth at bedtime. 30 capsule 5  . lovastatin (MEVACOR) 20 MG tablet Take 1 tablet (20 mg total) by mouth at bedtime. 30 tablet 11  . omeprazole (PRILOSEC) 40 MG capsule Take 1 capsule (40 mg total) by mouth daily. 90 capsule 3  . predniSONE  (DELTASONE) 20 MG tablet Take 2 pills for 3 days, 1 pill for 4 days 10 tablet 0  . PRISTIQ 100 MG 24 hr tablet TAKE ONE TABLET BY MOUTH ONCE DAILY 30 tablet 5  . propranolol (INDERAL) 20 MG tablet TAKE ONE TABLET BY MOUTH TWICE DAILY 60 tablet 5  . rosuvastatin (CRESTOR) 40 MG tablet daily.    . sertraline (ZOLOFT) 50 MG tablet TAKE ONE TABLET BY MOUTH ONCE DAILY 30 tablet 2  . SPIRIVA HANDIHALER 18 MCG inhalation capsule INHALE ONE DOSE BY MOUTH ONCE DAILY 30 capsule 3  . SYMBICORT 160-4.5 MCG/ACT inhaler INHALE TWO PUFFS INTO THE LUNGS TWICE DAILY 1 Inhaler 11   No current facility-administered medications for this visit.     Objective: BP 130/86 (BP Location: Left Arm, Patient Position: Sitting, Cuff Size: Normal)   Pulse 65   Temp 97.6 F (36.4 C) (Oral)   Wt 151 lb 6.4 oz (68.7 kg)   SpO2 94%   BMI 27.69 kg/m  Gen: NAD, resting comfortably CV: RRR no murmurs rubs or gallops Lungs: CTAB no crackles, wheeze, rhonchi Abdomen: soft/nontender/nondistended/normal bowel sounds. overweight Did not examine back Ext: no edema Skin: warm, dry Neuro: CN II-XII intact, sensation and reflexes normal throughout, 5/5 muscle strength in bilateral upper and lower extremities. Normal finger to nose. Normal rapid  alternating movements. No pronator drift. Normal romberg. Normal gait.   Assessment/Plan:  Nonintractable episodic headache, unspecified headache type; Facial paresthesia; Hand numbness  - Plan: MR Brain W Wo Contrast S:2 months of headaches- diffuse and scalp gets very tender. Checked her blood pressure and it was 178/107 but trending down in last few days. And now normal today BP Readings from Last 3 Encounters:  06/22/16 130/86  04/13/16 136/76  03/22/16 120/70  feels like eyes are bloodshot a lot of time. Headache everyday- wakes up with them and at times lasts all days. Have had HAs up to 10/10 but today it is 4/10.   History of CABG 2012, stent before bypass also. Carotid  stenosis noted but right side 40-50% and left side 1-39% last several years ago. Stress test nuclear scan 12/2015 was low risk. Mild left hand tingling at times. Sometimes face and nose feel somewhat numb for 2 months.   Advil, cold and sinus medicine may help short term with headache. should avoid nsaids with cardiac history  For last month also having some mid back pain which is where she hurt when she had her heart attack.   ROS- No facial or extremity weakness. No slurred words or trouble swallowing. no blurry vision or double vision. No confusion or word finding difficulties.  A/P: Numerous vascular risk factors- need MRI to rule out CVA but with intermittent nature strongly doubt. Also rule out mass in patient over 50 with new headache patterns. With numbness in face and arm at times- b12, tsh, cbc, cmp. Also has some back pain- and advised quick follow up with cardiology or ER if worsens. High risk situation in regards to headaches with uncertain prognosis.   Reports high BP but normal today  Bloodshot eyes- advised no further contact lenses, do glasses instead and follow up with optho  Orders Placed This Encounter  Procedures  . MR Brain W Wo Contrast    Standing Status:   Future    Standing Expiration Date:   08/22/2017    Order Specific Question:   If indicated for the ordered procedure, I authorize the administration of contrast media per Radiology protocol    Answer:   Yes    Order Specific Question:   Reason for Exam (SYMPTOM  OR DIAGNOSIS REQUIRED)    Answer:   new headaches for 2 months- start in different areas. Also left hand numbness at times and facial numbness. rule out stroke anad mass. doubt stroke as intermittent numbness and HA    Order Specific Question:   Preferred imaging location?    Answer:   [facility] (table limit-550lbs)    Order Specific Question:   What is the patient's sedation requirement?    Answer:   No Sedation    Order Specific Question:   Does  the patient have a pacemaker or implanted devices?    Answer:   Yes    Comments:   cardiac stent only  . CBC with Differential/Platelet  . Comprehensive metabolic panel    [place]  . TSH    [place]  . Vitamin B12   Return precautions advised.  Garret Reddish, MD

## 2016-06-22 NOTE — Patient Instructions (Signed)
Labs before you leave  We will call you within a week about your referral for MRI brain (this may take a month or so to do). If you do not hear within 2 weeks, give [place] a call.   If you were to have severe level headaches again- seek care or if new or worsening symptoms  Contact cardiology TODAY to schedule follow up next week hopefully

## 2016-07-03 NOTE — Progress Notes (Signed)
CARDIOLOGY OFFICE NOTE  Date:  07/04/2016    Katherine Foley Date of Birth: 04/25/1951 Medical Record [id number]  PCP:  Garret Reddish, MD  Cardiologist:  Burt Knack   Chief Complaint  Patient presents with  . Chest Pain    Work in visit - seen for Dr. Burt Knack    History of Present Illness: Katherine Foley is a 65 y.o. female who presents today for a work in visit. Seen for Dr. Burt Knack.   The patient has coronary artery disease and she underwent bare-metal stenting of the right coronary artery when she presented with non-ST elevation MI in 2011. The following year she underwent PCI of the left circumflex. She had been followed for aortic stenosis which progressed and she ultimately was treated with aortic valve replacement using a Magna Ease pericardial valve (21 mm) and LIMA-LAD graft in 2012. Other issues include HLD, GERD, COPD, anxiety and depression. Has known carotid disease - last study in 12/2015.   Last seen in March by Dr. Burt Knack - having some exertional dyspnea, but relates this to her 'COPD.'  Some chest pain as well. Myoview was updated and this turned out ok.   Comes in today. Here alone.  Remains upset about her weight. Having a multitude of symptoms. For a month she has had off and on pain between her shoulder blades - associated with shortness of breath. Very reminiscent of her prior chest pain syndrome. Not getting worse but not getting better. She is not really exercising - says the weather has been too hot. Not smoking. Her symptoms come on at night and are "just random". Typically without exertion. Also having headaches and planning for MRI later this week.   Past Medical History:  Diagnosis Date  . Adenomatous colon polyp   . ANXIETY DEPRESSION 02/15/2009  . AORTIC STENOSIS 07/07/2010   a. echo 12/23/10: EF 60-65%, mod to severe AS with mean gradient 29 mmHg;  b. 04/2011 s/p bioprosthetic AVR.  Marland Kitchen Barrett's esophagus 04/14/2007  . Barrett's esophagus 2000  . CAD, NATIVE  VESSEL 05/17/2010   a. NSTEMI 8/11 - BMS to RCA;  b. cath 4/12: dLAD 50%, RI 80% (PCI), AVCFX 30%, pRCA 30%, stent ok, 40-50, then 50% dist RCA;  c. s/p Promus DES to RI 12/25/10;  d. 04/2011 CABG x 1 (LIMA->LAD) @ time of AVR  . Carotid stenosis 05/17/2010   a. dopplers Q000111Q: RICA Q000111Q; LICA [id number] (follow up due 11/12)  . Chronic bronchitis ([place])    "multiple times" (03/25/2013)  . 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 Exertional shortness of breath    "related to COPD problems" (03/25/2013)  . GERD (gastroesophageal reflux disease)   . H/O hiatal hernia   . Hemorrhoid thrombosis    "had it lanced" (03/25/2013)  . Herpes simplex without mention of complication [id number]  . History of blood transfusion    'w/OHS" (03/25/2013)  . Lamar, MILD 08/29/2009  . HYPERTENSION 04/14/2007  . MIGRAINE HEADACHE 04/20/2008  . Myocardial infarction 2011   "during catherization" (03/25/2013)  . OSTEOPENIA 04/14/2007  . Ovarian cyst   . Pneumonia    "multiple times" (03/25/2013)  . URINARY INCONTINENCE, STRESS, MILD 08/26/2007  . Urine, incontinence, stress female     Past Surgical History:  Procedure Laterality Date  . AORTIC VALVE REPLACEMENT  2012  . APPENDECTOMY    . BLEPHAROPLASTY Bilateral ~ 1998  . CHOLECYSTECTOMY N/A 03/25/2013   Procedure: LAPAROSCOPIC CHOLECYSTECTOMY WITH INTRAOPERATIVE CHOLANGIOGRAM;  Surgeon: Rodman Key  Oren Section, MD;  Location: [facility];  Service: General;  Laterality: N/A;  . CORONARY ANGIOPLASTY WITH STENT PLACEMENT  2010   "I have 2 stents" (03/25/2013)  . NASAL SINUS SURGERY  1997; 1998   "cust in maxillary sinus; put a stent in then removed it" (03/25/2013)  . OVARIAN CYST REMOVAL Right 2011   "size of a soccer ball" (03/25/2013)  . thrombosed hemorrohid lanced    . TONSILLECTOMY AND ADENOIDECTOMY    . TOTAL ABDOMINAL HYSTERECTOMY       Medications: Current Outpatient Prescriptions  Medication Sig Dispense Refill  . albuterol (PROVENTIL  HFA;VENTOLIN HFA) 108 (90 Base) MCG/ACT inhaler Inhale 2 puffs into the lungs every 6 (six) hours as needed. 1 Inhaler 1  . aspirin 81 MG tablet Take 81 mg by mouth daily.      Marland Kitchen BIOTIN 5000 PO Take 1 capsule by mouth daily.    Marland Kitchen doxycycline (VIBRA-TABS) 100 MG tablet Take 1 tablet (100 mg total) by mouth 2 (two) times daily. 14 tablet 0  . gabapentin (NEURONTIN) 100 MG capsule Take 1 capsule (100 mg total) by mouth at bedtime. 30 capsule 5  . lovastatin (MEVACOR) 20 MG tablet Take 1 tablet (20 mg total) by mouth at bedtime. 30 tablet 11  . omeprazole (PRILOSEC) 40 MG capsule Take 1 capsule (40 mg total) by mouth daily. 90 capsule 3  . predniSONE (DELTASONE) 20 MG tablet Take 2 pills for 3 days, 1 pill for 4 days 10 tablet 0  . PRISTIQ 100 MG 24 hr tablet TAKE ONE TABLET BY MOUTH ONCE DAILY 30 tablet 5  . propranolol (INDERAL) 20 MG tablet TAKE ONE TABLET BY MOUTH TWICE DAILY 60 tablet 5  . rosuvastatin (CRESTOR) 40 MG tablet Take 40 mg by mouth daily.     . sertraline (ZOLOFT) 50 MG tablet TAKE ONE TABLET BY MOUTH ONCE DAILY 30 tablet 2  . SPIRIVA HANDIHALER 18 MCG inhalation capsule INHALE ONE DOSE BY MOUTH ONCE DAILY 30 capsule 3  . SYMBICORT 160-4.5 MCG/ACT inhaler INHALE TWO PUFFS INTO THE LUNGS TWICE DAILY 1 Inhaler 11  . nitroGLYCERIN (NITROSTAT) 0.4 MG SL tablet Place 1 tablet (0.4 mg total) under the tongue every 5 (five) minutes as needed for chest pain. 25 tablet 3   No current facility-administered medications for this visit.     Allergies: Allergies  Allergen Reactions  . Codeine Sulfate Rash    REACTION: unspecified  . Hydrocodone-Acetaminophen Rash    REACTION: unspecified  . Amoxicillin-Pot Clavulanate     REACTION: unspecified    Social History: The patient  reports that she quit smoking about 10 years ago. Her smoking use included Cigarettes. She has a 64.50 pack-year smoking history. She has never used smokeless tobacco. She reports that she drinks alcohol. She  reports that she does not use drugs.   Family History: The patient's family history includes Asthma in her son; Breast cancer in her daughter and daughter; COPD in her father; Coronary artery disease in her other; Heart disease in her father, maternal uncle, mother, and other; Rheum arthritis in her daughter and father.   Review of Systems: Please see the history of present illness.   Otherwise, the review of systems is positive for none.   All other systems are reviewed and negative.   Physical Exam: VS:  BP 130/90   Pulse 64   Ht 5' 2.5" (1.588 m)   Wt 152 lb 12.8 oz (69.3 kg)   SpO2 97%  BMI 27.50 kg/m  .  BMI Body mass index is 27.5 kg/m.  Wt Readings from Last 3 Encounters:  07/04/16 152 lb 12.8 oz (69.3 kg)  06/22/16 151 lb 6.4 oz (68.7 kg)  04/13/16 150 lb 6.4 oz (68.2 kg)    General: Pleasant. Well developed, well nourished and in no acute distress.   HEENT: Normal.  Neck: Supple, no JVD, carotid bruits, or masses noted.  Cardiac: Regular rate and rhythm. No murmurs, rubs, or gallops. No edema.  Respiratory:  Lungs are clear to auscultation bilaterally with normal work of breathing.  GI: Soft and nontender.  MS: No deformity or atrophy. Gait and ROM intact.  Skin: Warm and dry. Color is normal.  Neuro:  Strength and sensation are intact and no gross focal deficits noted.  Psych: Alert, appropriate and with normal affect.   LABORATORY DATA:  EKG:  EKG is ordered today. This demonstrates NSR with nonspecific changes.  Lab Results  Component Value Date   WBC 5.8 06/22/2016   HGB 12.2 06/22/2016   HCT 37.2 06/22/2016   PLT 204.0 06/22/2016   GLUCOSE 95 06/22/2016   CHOL 157 12/09/2014   TRIG 74.0 12/09/2014   HDL 65.00 12/09/2014   LDLDIRECT 64.0 12/21/2015   LDLCALC 77 12/09/2014   ALT 22 06/22/2016   AST 20 06/22/2016   NA 134 (L) 06/22/2016   K 4.8 06/22/2016   CL 99 06/22/2016   CREATININE 0.64 06/22/2016   BUN 18 06/22/2016   CO2 30 06/22/2016    TSH 0.41 06/22/2016   INR 1.18 05/31/2015   HGBA1C 6.2 (H) 04/27/2011    BNP (last 3 results) No results for input(s): BNP in the last 8760 hours.  ProBNP (last 3 results) No results for input(s): PROBNP in the last 8760 hours.   Other Studies Reviewed Today:  Echo Study Conclusions from 11/2015  - Left ventricle: The cavity size was normal. Systolic function was   vigorous. The estimated ejection fraction was in the range of 65%   to 70%. Wall motion was normal; there were no regional wall   motion abnormalities. There was an increased relative   contribution of atrial contraction to ventricular filling.   Doppler parameters are consistent with abnormal left ventricular   relaxation (grade 1 diastolic dysfunction). - Aortic valve: Poorly visualized. A bioprosthesis was present and   functioning normally. Although individual leaflets not adequately   visualized, there does appear to be some mild calcification. The   mean AV gradient with within normal limits for this valve at   87mmHg. Valve area (VTI): 1.06 cm^2. Valve area (Vmax): 1.06   cm^2. Valve area (Vmean): 1 cm^2. - Mitral valve: There was trivial regurgitation. - Tricuspid valve: There was trivial regurgitation.  Impressions:  - Normal LVF with grade 1 diastolic dysfunction. There is a stable   bioprosthesis in the AV position with no significant aortic   stenosis. The mean AV gradient has increased from 7 to 15mmHg   since echo of 2015.   Myoview Study Highlights 11/2015    The left ventricular ejection fraction is hyperdynamic (>65%).  Nuclear stress EF: 70%.  There was no ST segment deviation noted during stress.  The study is normal.  This is a low risk study.   Normal pharmacologic nuclear stress test with no evidence of prior infarct or ischemia.       Assessment/Plan: 1.   CAD, native vessel, with angina - very reminiscent of prior chest pain sydrnome: She has  had fairly recent Myoview. Do  not think she would be able to tolerate long acting nitrates given her headaches and plans for MRI. Have recommended cardiac catheterization with Dr. Burt Knack to further define. The patient understands that risks include but are not limited to stroke (1 in 1000), death (1 in 60), kidney failure [usually temporary] (1 in 500), bleeding (1 in 200), allergic reaction [possibly serious] (1 in 200), and agrees to proceed. Arranged for Friday with Dr. Burt Knack. Lab and CXR today.   2. Aortic valve disorder: Patient with history of bicuspid aortic valve stenosis. She is status post bioprosthetic aortic valve replacement. Most recent echo stable.   3. Hyperlipidemia: Treated with lovastatin  4. Carotid disease - last study in March - ICA's patent bilaterally.   Current medicines are reviewed with the patient today.  The patient does not have concerns regarding medicines other than what has been noted above.  The following changes have been made:  See above.  Labs/ tests ordered today include:    Orders Placed This Encounter  Procedures  . DG Chest 2 View  . Basic metabolic panel  . CBC  . Protime-INR  . APTT  . EKG 12-Lead     Disposition:   Further disposition pending.   Patient is agreeable to this plan and will call if any problems develop in the interim.   Signed: Burtis Junes, RN, ANP-C 07/04/2016 11:52 AM  [facility] [address] [place], [place]  [address] Phone: [phone] Fax: [phone]

## 2016-07-04 ENCOUNTER — Ambulatory Visit
Admission: RE | Admit: 2016-07-04 | Discharge: 2016-07-04 | Disposition: A | Discharge status: Home / Self Care | Payer: PPO | Source: Ambulatory Visit | Attending: Nurse Practitioner | Admitting: Nurse Practitioner

## 2016-07-04 ENCOUNTER — Other Ambulatory Visit: Payer: Self-pay | Admitting: Nurse Practitioner

## 2016-07-04 ENCOUNTER — Ambulatory Visit (INDEPENDENT_AMBULATORY_CARE_PROVIDER_SITE_OTHER): Payer: PPO | Admitting: Nurse Practitioner

## 2016-07-04 ENCOUNTER — Encounter: Payer: Self-pay | Admitting: Nurse Practitioner

## 2016-07-04 VITALS — BP 130/90 | HR 64 | Ht 62.5 in | Wt 152.8 lb

## 2016-07-04 DIAGNOSIS — R079 Chest pain, unspecified: Secondary | ICD-10-CM | POA: Diagnosis not present

## 2016-07-04 DIAGNOSIS — I25118 Atherosclerotic heart disease of native coronary artery with other forms of angina pectoris: Secondary | ICD-10-CM | POA: Diagnosis not present

## 2016-07-04 DIAGNOSIS — R0602 Shortness of breath: Secondary | ICD-10-CM

## 2016-07-04 LAB — BASIC METABOLIC PANEL
BUN: 14 mg/dL (ref 7–25)
CO2: 25 mmol/L (ref 20–31)
Calcium: 9.3 mg/dL (ref 8.6–10.4)
Chloride: 100 mmol/L (ref 98–110)
Creat: 0.7 mg/dL (ref 0.50–0.99)
Glucose, Bld: 87 mg/dL (ref 65–99)
Potassium: 4.5 mmol/L (ref 3.5–5.3)
Sodium: 136 mmol/L (ref 135–146)

## 2016-07-04 LAB — CBC
HCT: 39.6 % (ref 35.0–45.0)
Hemoglobin: 12.8 g/dL (ref 11.7–15.5)
MCH: 29 pg (ref 27.0–33.0)
MCHC: 32.3 g/dL (ref 32.0–36.0)
MCV: 89.8 fL (ref 80.0–100.0)
MPV: 9.8 fL (ref 7.5–12.5)
Platelets: 209 10*3/uL (ref 140–400)
RBC: 4.41 MIL/uL (ref 3.80–5.10)
RDW: 15.1 % — ABNORMAL HIGH (ref 11.0–15.0)
WBC: 5.6 10*3/uL (ref 3.8–10.8)

## 2016-07-04 LAB — APTT: aPTT: 28 s (ref 22–34)

## 2016-07-04 LAB — PROTIME-INR
INR: 1
Prothrombin Time: 10.6 s (ref 9.0–11.5)

## 2016-07-04 MED ORDER — NITROGLYCERIN 0.4 MG SL SUBL: 0.4000 mg | SUBLINGUAL_TABLET | SUBLINGUAL | 3 refills | Status: DC | PRN

## 2016-07-04 NOTE — Patient Instructions (Addendum)
We will be checking the following labs today - BMET, CBC, PT, PTT  Please go to [facility] to [place] on the first floor for a chest Xray - you may walk in.   Talk to [place] about rescheduling your MRI of the head   Medication Instructions:    Continue with your current medicines. But I have sent in a RX for NTG - Use your NTG under your tongue for recurrent chest pain. May take one tablet every 5 minutes. If you are still having discomfort after 3 tablets in 15 minutes, call 911.     Testing/Procedures To Be Arranged:  Cardiac catheterization with Dr. Burt Knack   Other Special Instructions:   Your provider has recommended a cardiac catherization  You are scheduled for a cardiac catheterization on Friday, November 3rd at 12 noon with Dr. Burt Knack or associate.  Please arrive at the [facility] (Main Entrance) at [facility] at [place] [facility] on Friday, November 3rd at 10 AM.    Special note: Every effort is made to have your procedure done on time.   Please understand that emergencies sometimes delay a scheduled   procedure.  No food or drink after midnight on Thursday. You may take your morning medications with a sip of water on the day of your procedure.  Medications to [place] for a one night stay -- bring personal belongings.  Bring a current list of your medications and current insurance cards.  You MUST have a responsible person to drive you home. Someone MUST be with you the first 24 hours after you arrive home or your discharge will be delayed. Wear clothes that are easy to get on and off and wear slip on shoes.    Coronary Angiogram A coronary angiogram, also called coronary angiography, is an X-ray procedure used to look at the arteries in the heart. In this procedure, a dye (contrast dye) is injected through a long, hollow tube (catheter). The catheter is about the size of a piece of  cooked spaghetti and is inserted through your groin, wrist, or arm. The dye is injected into each artery, and X-rays are then taken to show if there is a blockage in the arteries of your heart.  LET [facility] CARE PROVIDER KNOW ABOUT: Any allergies you have, including allergies to shellfish or contrast dye.  All medicines you are taking, including vitamins, herbs, eye drops, creams, and over-the-counter medicines.  Previous problems you or members of your family have had with the use of anesthetics.  Any blood disorders you have.  Previous surgeries you have had. History of kidney problems or failure.  Other medical conditions you have.  RISKS AND COMPLICATIONS  Generally, a coronary angiogram is a safe procedure. However, about 1 person out of 1000 can have problems that may include: Allergic reaction to the dye. Bleeding/bruising from the access site or other locations. Kidney injury, especially in people with impaired kidney function. Stroke (rare). Heart attack (rare). Irregular rhythms (rare) Death (rare)  BEFORE THE PROCEDURE  Do not eat or drink anything after midnight the night before the procedure or as directed by your health care provider.  Ask your health care provider about changing or stopping your regular medicines. This is especially important if you are taking diabetes medicines or blood thinners.  PROCEDURE You may be given a medicine to help you relax (sedative) before the procedure. This medicine is given  through an intravenous (IV) access tube that is inserted into one of your veins.  The area where the catheter will be inserted will be washed and shaved. This is usually done in the groin but may be done in the fold of your arm (near your elbow) or in the wrist.  A medicine will be given to numb the area where the catheter will be inserted (local anesthetic).  The health care provider will insert the catheter into an artery. The catheter will be guided  by using a special type of X-ray (fluoroscopy) of the blood vessel being examined.  A special dye will then be injected into the catheter, and X-rays will be taken. The dye will help to show where any narrowing or blockages are located in the heart arteries.    AFTER THE PROCEDURE  If the procedure is done through the leg, you will be kept in bed lying flat for several hours. You will be instructed to not bend or cross your legs. The insertion site will be checked frequently.  The pulse in your feet or wrist will be checked frequently.  Additional blood tests, X-rays, and an electrocardiogram may be done.      If you need a refill on your cardiac medications before your next appointment, please call your pharmacy.   Call the [place] office at [phone] if you have any questions, problems or concerns.

## 2016-07-05 ENCOUNTER — Telehealth: Payer: Self-pay | Admitting: Cardiovascular Disease

## 2016-07-05 NOTE — Telephone Encounter (Signed)
PT AWARE OF LAB RESULTS./CY 

## 2016-07-05 NOTE — Telephone Encounter (Signed)
New message ° ° ° ° °Returning a call to the nurse °

## 2016-07-06 ENCOUNTER — Encounter (HOSPITAL_COMMUNITY)
Admission: RE | Disposition: A | Discharge status: Home / Self Care | Payer: Self-pay | Source: Ambulatory Visit | Attending: Cardiovascular Disease

## 2016-07-06 ENCOUNTER — Inpatient Hospital Stay: Admission: RE | Admit: 2016-07-06 | Payer: PPO | Source: Ambulatory Visit

## 2016-07-06 ENCOUNTER — Ambulatory Visit (HOSPITAL_COMMUNITY)
Admission: RE | Admit: 2016-07-06 | Discharge: 2016-07-06 | Disposition: A | Discharge status: Home / Self Care | Payer: PPO | Source: Ambulatory Visit | Attending: Cardiovascular Disease | Admitting: Cardiovascular Disease

## 2016-07-06 ENCOUNTER — Encounter (HOSPITAL_COMMUNITY): Payer: Self-pay | Admitting: *Deleted

## 2016-07-06 DIAGNOSIS — Z952 Presence of prosthetic heart valve: Secondary | ICD-10-CM | POA: Diagnosis not present

## 2016-07-06 DIAGNOSIS — E785 Hyperlipidemia, unspecified: Secondary | ICD-10-CM | POA: Insufficient documentation

## 2016-07-06 DIAGNOSIS — Z7982 Long term (current) use of aspirin: Secondary | ICD-10-CM | POA: Insufficient documentation

## 2016-07-06 DIAGNOSIS — Z951 Presence of aortocoronary bypass graft: Secondary | ICD-10-CM | POA: Insufficient documentation

## 2016-07-06 DIAGNOSIS — Z79899 Other long term (current) drug therapy: Secondary | ICD-10-CM | POA: Diagnosis not present

## 2016-07-06 DIAGNOSIS — K219 Gastro-esophageal reflux disease without esophagitis: Secondary | ICD-10-CM | POA: Diagnosis not present

## 2016-07-06 DIAGNOSIS — Y831 Surgical operation with implant of artificial internal device as the cause of abnormal reaction of the patient, or of later complication, without mention of misadventure at the time of the procedure: Secondary | ICD-10-CM | POA: Insufficient documentation

## 2016-07-06 DIAGNOSIS — I10 Essential (primary) hypertension: Secondary | ICD-10-CM | POA: Insufficient documentation

## 2016-07-06 DIAGNOSIS — F419 Anxiety disorder, unspecified: Secondary | ICD-10-CM | POA: Insufficient documentation

## 2016-07-06 DIAGNOSIS — I2510 Atherosclerotic heart disease of native coronary artery without angina pectoris: Secondary | ICD-10-CM | POA: Insufficient documentation

## 2016-07-06 DIAGNOSIS — F329 Major depressive disorder, single episode, unspecified: Secondary | ICD-10-CM | POA: Insufficient documentation

## 2016-07-06 DIAGNOSIS — R079 Chest pain, unspecified: Secondary | ICD-10-CM | POA: Diagnosis not present

## 2016-07-06 DIAGNOSIS — I77819 Aortic ectasia, unspecified site: Secondary | ICD-10-CM | POA: Diagnosis not present

## 2016-07-06 DIAGNOSIS — T82855A Stenosis of coronary artery stent, initial encounter: Secondary | ICD-10-CM | POA: Insufficient documentation

## 2016-07-06 DIAGNOSIS — I252 Old myocardial infarction: Secondary | ICD-10-CM | POA: Insufficient documentation

## 2016-07-06 DIAGNOSIS — J449 Chronic obstructive pulmonary disease, unspecified: Secondary | ICD-10-CM | POA: Insufficient documentation

## 2016-07-06 DIAGNOSIS — Z87891 Personal history of nicotine dependence: Secondary | ICD-10-CM | POA: Insufficient documentation

## 2016-07-06 HISTORY — PX: CARDIAC CATHETERIZATION: SHX172

## 2016-07-06 SURGERY — LEFT HEART CATH AND CORS/GRAFTS ANGIOGRAPHY

## 2016-07-06 MED ORDER — IOPAMIDOL (ISOVUE-370) INJECTION 76%
INTRAVENOUS | Status: DC | PRN
  Administered 2016-07-06: 65 mL via INTRA_ARTERIAL

## 2016-07-06 MED ORDER — LIDOCAINE HCL (PF) 1 % IJ SOLN
INTRAMUSCULAR | Status: AC
  Filled 2016-07-06: qty 30

## 2016-07-06 MED ORDER — FENTANYL CITRATE (PF) 100 MCG/2ML IJ SOLN
INTRAMUSCULAR | Status: AC
  Filled 2016-07-06: qty 2

## 2016-07-06 MED ORDER — MIDAZOLAM HCL 2 MG/2ML IJ SOLN
INTRAMUSCULAR | Status: DC | PRN
Start: 2016-07-06 — End: 2016-07-06
  Administered 2016-07-06: 2 mg via INTRAVENOUS

## 2016-07-06 MED ORDER — SODIUM CHLORIDE 0.9% FLUSH: 3.0000 mL | Freq: Two times a day (BID) | INTRAVENOUS | Status: DC

## 2016-07-06 MED ORDER — FENTANYL CITRATE (PF) 100 MCG/2ML IJ SOLN
INTRAMUSCULAR | Status: DC | PRN
  Administered 2016-07-06: 25 ug via INTRAVENOUS

## 2016-07-06 MED ORDER — IOPAMIDOL (ISOVUE-370) INJECTION 76%
INTRAVENOUS | Status: AC
  Filled 2016-07-06: qty 100

## 2016-07-06 MED ORDER — ALPRAZOLAM 0.25 MG PO TABS: 1.0000 mg | ORAL_TABLET | Freq: Two times a day (BID) | ORAL | Status: DC | PRN

## 2016-07-06 MED ORDER — SODIUM CHLORIDE 0.9 % WEIGHT BASED INFUSION: 3.0000 mL/kg/h | INTRAVENOUS | Status: DC

## 2016-07-06 MED ORDER — ONDANSETRON HCL 4 MG/2ML IJ SOLN
4.0000 mg | Freq: Four times a day (QID) | INTRAMUSCULAR | Status: DC | PRN
Start: 2016-07-06 — End: 2016-07-06

## 2016-07-06 MED ORDER — VERAPAMIL HCL 2.5 MG/ML IV SOLN
INTRAVENOUS | Status: DC | PRN
  Administered 2016-07-06: 10 mL via INTRA_ARTERIAL

## 2016-07-06 MED ORDER — LIDOCAINE HCL (PF) 1 % IJ SOLN
INTRAMUSCULAR | Status: DC | PRN
  Administered 2016-07-06: 2 mL

## 2016-07-06 MED ORDER — DIAZEPAM 5 MG PO TABS
ORAL_TABLET | ORAL | Status: AC
  Administered 2016-07-06: 5 mg via ORAL
  Filled 2016-07-06: qty 2

## 2016-07-06 MED ORDER — HEPARIN SODIUM (PORCINE) 1000 UNIT/ML IJ SOLN
INTRAMUSCULAR | Status: AC
  Filled 2016-07-06: qty 1

## 2016-07-06 MED ORDER — HEPARIN (PORCINE) IN NACL 2-0.9 UNIT/ML-% IJ SOLN
INTRAMUSCULAR | Status: AC
  Filled 2016-07-06: qty 1500

## 2016-07-06 MED ORDER — SODIUM CHLORIDE 0.9 % IV SOLN: 250.0000 mL | INTRAVENOUS | Status: DC | PRN

## 2016-07-06 MED ORDER — MIDAZOLAM HCL 2 MG/2ML IJ SOLN
INTRAMUSCULAR | Status: AC
  Filled 2016-07-06: qty 2

## 2016-07-06 MED ORDER — SODIUM CHLORIDE 0.9% FLUSH: 3.0000 mL | INTRAVENOUS | Status: DC | PRN

## 2016-07-06 MED ORDER — HEPARIN (PORCINE) IN NACL 2-0.9 UNIT/ML-% IJ SOLN
INTRAMUSCULAR | Status: DC | PRN
  Administered 2016-07-06: 1000 mL

## 2016-07-06 MED ORDER — DIAZEPAM 5 MG PO TABS
10.0000 mg | ORAL_TABLET | ORAL | Status: AC
  Administered 2016-07-06: 5 mg via ORAL

## 2016-07-06 MED ORDER — ASPIRIN 81 MG PO CHEW: 81.0000 mg | CHEWABLE_TABLET | ORAL | Status: DC

## 2016-07-06 MED ORDER — VERAPAMIL HCL 2.5 MG/ML IV SOLN
INTRAVENOUS | Status: AC
  Filled 2016-07-06: qty 2

## 2016-07-06 MED ORDER — SODIUM CHLORIDE 0.9 % IV SOLN
INTRAVENOUS | Status: DC
  Administered 2016-07-06: 11:00:00 via INTRAVENOUS

## 2016-07-06 MED ORDER — HEPARIN SODIUM (PORCINE) 1000 UNIT/ML IJ SOLN
INTRAMUSCULAR | Status: DC | PRN
  Administered 2016-07-06: 4000 [IU] via INTRAVENOUS

## 2016-07-06 SURGICAL SUPPLY — 12 items
CATH INFINITI 5FR ANG PIGTAIL (CATHETERS) ×1 IMPLANT
CATH INFINITI 5FR JL4 (CATHETERS) ×1 IMPLANT
CATH INFINITI 5FR JL5 (CATHETERS) ×1 IMPLANT
CATH INFINITI JR4 5F (CATHETERS) ×1 IMPLANT
DEVICE RAD COMP TR BAND LRG (VASCULAR PRODUCTS) ×1 IMPLANT
GLIDESHEATH SLEND SS 6F .021 (SHEATH) ×1 IMPLANT
KIT HEART LEFT (KITS) ×2 IMPLANT
PACK CARDIAC CATHETERIZATION (CUSTOM PROCEDURE TRAY) ×2 IMPLANT
SYR MEDRAD MARK V 150ML (SYRINGE) ×2 IMPLANT
TRANSDUCER W/STOPCOCK (MISCELLANEOUS) ×2 IMPLANT
TUBING CIL FLEX 10 FLL-RA (TUBING) ×2 IMPLANT
WIRE SAFE-T 1.5MM-J .035X260CM (WIRE) ×1 IMPLANT

## 2016-07-06 NOTE — Discharge Instructions (Signed)
Radial Site Care °Refer to this sheet in the next few weeks. These instructions provide you with information about caring for yourself after your procedure. Your health care provider may also give you more specific instructions. Your treatment has been planned according to current medical practices, but problems sometimes occur. Call your health care provider if you have any problems or questions after your procedure. °WHAT TO EXPECT AFTER THE PROCEDURE °After your procedure, it is typical to have the following: °· Bruising at the radial site that usually fades within 1-2 weeks. °· Blood collecting in the tissue (hematoma) that may be painful to the touch. It should usually decrease in size and tenderness within 1-2 weeks. °HOME CARE INSTRUCTIONS °· Take medicines only as directed by your health care provider. °· You may shower 24-48 hours after the procedure or as directed by your health care provider. Remove the bandage (dressing) and gently wash the site with plain soap and water. Pat the area dry with a clean towel. Do not rub the site, because this may cause bleeding. °· Do not take baths, swim, or use a hot tub until your health care provider approves. °· Check your insertion site every day for redness, swelling, or drainage. °· Do not apply powder or lotion to the site. °· Do not flex or bend the affected arm for 24 hours or as directed by your health care provider. °· Do not push or pull heavy objects with the affected arm for 24 hours or as directed by your health care provider. °· Do not lift over 10 lb (4.5 kg) for 5 days after your procedure or as directed by your health care provider. °· Ask your health care provider when it is okay to: °¨ Return to work or school. °¨ Resume usual physical activities or sports. °¨ Resume sexual activity. °· Do not drive home if you are discharged the same day as the procedure. Have someone else drive you. °· You may drive 24 hours after the procedure unless otherwise  instructed by your health care provider. °· Do not operate machinery or power tools for 24 hours after the procedure. °· If your procedure was done as an outpatient procedure, which means that you went home the same day as your procedure, a responsible adult should be with you for the first 24 hours after you arrive home. °· Keep all follow-up visits as directed by your health care provider. This is important. °SEEK MEDICAL CARE IF: °· You have a fever. °· You have chills. °· You have increased bleeding from the radial site. Hold pressure on the site. °SEEK IMMEDIATE MEDICAL CARE IF: °· You have unusual pain at the radial site. °· You have redness, warmth, or swelling at the radial site. °· You have drainage (other than a small amount of blood on the dressing) from the radial site. °· The radial site is bleeding, and the bleeding does not stop after 30 minutes of holding steady pressure on the site. °· Your arm or hand becomes pale, cool, tingly, or numb. °  °This information is not intended to replace advice given to you by your health care provider. Make sure you discuss any questions you have with your health care provider. °  °Document Released: 09/22/2010 Document Revised: 09/10/2014 Document Reviewed: 03/08/2014 °Elsevier Interactive Patient Education ©2016 [employer] ° °

## 2016-07-06 NOTE — Interval H&P Note (Signed)
History and Physical Interval Note:  07/06/2016 2:06 PM  Katherine Foley  has presented today for surgery, with the diagnosis of unstable angina  The various methods of treatment have been discussed with the patient and family. After consideration of risks, benefits and other options for treatment, the patient has consented to  Procedure(s): Left Heart Cath and Cors/Grafts Angiography (N/A) as a surgical intervention .  The patient's history has been reviewed, patient examined, no change in status, stable for surgery.  I have reviewed the patient's chart and labs.  Questions were answered to the patient's satisfaction.    Cath Lab Visit (complete for each Cath Lab visit)  Clinical Evaluation Leading to the Procedure:   ACS: No.  Non-ACS:    Anginal Classification: CCS III  Anti-ischemic medical therapy: Minimal Therapy (1 class of medications)  Non-Invasive Test Results: Low-risk stress test findings: cardiac mortality <1%/year  Prior CABG: Previous CABG       Sherren Mocha

## 2016-07-06 NOTE — H&P (View-Only) (Signed)
CARDIOLOGY OFFICE NOTE  Date:  07/04/2016    Lenward Chancellor Date of Birth: 1950-10-02 Medical Record [id number]  PCP:  Garret Reddish, MD  Cardiologist:  Burt Knack   Chief Complaint  Patient presents with  . Chest Pain    Work in visit - seen for Dr. Burt Knack    History of Present Illness: Katherine Foley is a 65 y.o. female who presents today for a work in visit. Seen for Dr. Burt Knack.   The patient has coronary artery disease and she underwent bare-metal stenting of the right coronary artery when she presented with non-ST elevation MI in 2011. The following year she underwent PCI of the left circumflex. She had been followed for aortic stenosis which progressed and she ultimately was treated with aortic valve replacement using a Magna Ease pericardial valve (21 mm) and LIMA-LAD graft in 2012. Other issues include HLD, GERD, COPD, anxiety and depression. Has known carotid disease - last study in 12/2015.   Last seen in March by Dr. Burt Knack - having some exertional dyspnea, but relates this to her 'COPD.'  Some chest pain as well. Myoview was updated and this turned out ok.   Comes in today. Here alone.  Remains upset about her weight. Having a multitude of symptoms. For a month she has had off and on pain between her shoulder blades - associated with shortness of breath. Very reminiscent of her prior chest pain syndrome. Not getting worse but not getting better. She is not really exercising - says the weather has been too hot. Not smoking. Her symptoms come on at night and are "just random". Typically without exertion. Also having headaches and planning for MRI later this week.   Past Medical History:  Diagnosis Date  . Adenomatous colon polyp   . ANXIETY DEPRESSION 02/15/2009  . AORTIC STENOSIS 07/07/2010   a. echo 12/23/10: EF 60-65%, mod to severe AS with mean gradient 29 mmHg;  b. 04/2011 s/p bioprosthetic AVR.  Marland Kitchen Barrett's esophagus 04/14/2007  . Barrett's esophagus 2000  . CAD, NATIVE  VESSEL 05/17/2010   a. NSTEMI 8/11 - BMS to RCA;  b. cath 4/12: dLAD 50%, RI 80% (PCI), AVCFX 30%, pRCA 30%, stent ok, 40-50, then 50% dist RCA;  c. s/p Promus DES to RI 12/25/10;  d. 04/2011 CABG x 1 (LIMA->LAD) @ time of AVR  . Carotid stenosis 05/17/2010   a. dopplers Q000111Q: RICA Q000111Q; LICA [id number] (follow up due 11/12)  . Chronic bronchitis ([place])    "multiple times" (03/25/2013)  . 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 Exertional shortness of breath    "related to COPD problems" (03/25/2013)  . GERD (gastroesophageal reflux disease)   . H/O hiatal hernia   . Hemorrhoid thrombosis    "had it lanced" (03/25/2013)  . Herpes simplex without mention of complication [id number]  . History of blood transfusion    'w/OHS" (03/25/2013)  . [place], MILD 08/29/2009  . HYPERTENSION 04/14/2007  . MIGRAINE HEADACHE 04/20/2008  . Myocardial infarction 2011   "during catherization" (03/25/2013)  . OSTEOPENIA 04/14/2007  . Ovarian cyst   . Pneumonia    "multiple times" (03/25/2013)  . URINARY INCONTINENCE, STRESS, MILD 08/26/2007  . Urine, incontinence, stress female     Past Surgical History:  Procedure Laterality Date  . AORTIC VALVE REPLACEMENT  2012  . APPENDECTOMY    . BLEPHAROPLASTY Bilateral ~ 1998  . CHOLECYSTECTOMY N/A 03/25/2013   Procedure: LAPAROSCOPIC CHOLECYSTECTOMY WITH INTRAOPERATIVE CHOLANGIOGRAM;  Surgeon: Rodman Key  Oren Section, MD;  Location: [facility];  Service: General;  Laterality: N/A;  . CORONARY ANGIOPLASTY WITH STENT PLACEMENT  2010   "I have 2 stents" (03/25/2013)  . NASAL SINUS SURGERY  1997; 1998   "cust in maxillary sinus; put a stent in then removed it" (03/25/2013)  . OVARIAN CYST REMOVAL Right 2011   "size of a soccer ball" (03/25/2013)  . thrombosed hemorrohid lanced    . TONSILLECTOMY AND ADENOIDECTOMY    . TOTAL ABDOMINAL HYSTERECTOMY       Medications: Current Outpatient Prescriptions  Medication Sig Dispense Refill  . albuterol (PROVENTIL  HFA;VENTOLIN HFA) 108 (90 Base) MCG/ACT inhaler Inhale 2 puffs into the lungs every 6 (six) hours as needed. 1 Inhaler 1  . aspirin 81 MG tablet Take 81 mg by mouth daily.      Marland Kitchen BIOTIN 5000 PO Take 1 capsule by mouth daily.    Marland Kitchen doxycycline (VIBRA-TABS) 100 MG tablet Take 1 tablet (100 mg total) by mouth 2 (two) times daily. 14 tablet 0  . gabapentin (NEURONTIN) 100 MG capsule Take 1 capsule (100 mg total) by mouth at bedtime. 30 capsule 5  . lovastatin (MEVACOR) 20 MG tablet Take 1 tablet (20 mg total) by mouth at bedtime. 30 tablet 11  . omeprazole (PRILOSEC) 40 MG capsule Take 1 capsule (40 mg total) by mouth daily. 90 capsule 3  . predniSONE (DELTASONE) 20 MG tablet Take 2 pills for 3 days, 1 pill for 4 days 10 tablet 0  . PRISTIQ 100 MG 24 hr tablet TAKE ONE TABLET BY MOUTH ONCE DAILY 30 tablet 5  . propranolol (INDERAL) 20 MG tablet TAKE ONE TABLET BY MOUTH TWICE DAILY 60 tablet 5  . rosuvastatin (CRESTOR) 40 MG tablet Take 40 mg by mouth daily.     . sertraline (ZOLOFT) 50 MG tablet TAKE ONE TABLET BY MOUTH ONCE DAILY 30 tablet 2  . SPIRIVA HANDIHALER 18 MCG inhalation capsule INHALE ONE DOSE BY MOUTH ONCE DAILY 30 capsule 3  . SYMBICORT 160-4.5 MCG/ACT inhaler INHALE TWO PUFFS INTO THE LUNGS TWICE DAILY 1 Inhaler 11  . nitroGLYCERIN (NITROSTAT) 0.4 MG SL tablet Place 1 tablet (0.4 mg total) under the tongue every 5 (five) minutes as needed for chest pain. 25 tablet 3   No current facility-administered medications for this visit.     Allergies: Allergies  Allergen Reactions  . Codeine Sulfate Rash    REACTION: unspecified  . Hydrocodone-Acetaminophen Rash    REACTION: unspecified  . Amoxicillin-Pot Clavulanate     REACTION: unspecified    Social History: The patient  reports that she quit smoking about 10 years ago. Her smoking use included Cigarettes. She has a 64.50 pack-year smoking history. She has never used smokeless tobacco. She reports that she drinks alcohol. She  reports that she does not use drugs.   Family History: The patient's family history includes Asthma in her son; Breast cancer in her daughter and daughter; COPD in her father; Coronary artery disease in her other; Heart disease in her father, maternal uncle, mother, and other; Rheum arthritis in her daughter and father.   Review of Systems: Please see the history of present illness.   Otherwise, the review of systems is positive for none.   All other systems are reviewed and negative.   Physical Exam: VS:  BP 130/90   Pulse 64   Ht 5' 2.5" (1.588 m)   Wt 152 lb 12.8 oz (69.3 kg)   SpO2 97%  BMI 27.50 kg/m  .  BMI Body mass index is 27.5 kg/m.  Wt Readings from Last 3 Encounters:  07/04/16 152 lb 12.8 oz (69.3 kg)  06/22/16 151 lb 6.4 oz (68.7 kg)  04/13/16 150 lb 6.4 oz (68.2 kg)    General: Pleasant. Well developed, well nourished and in no acute distress.   HEENT: Normal.  Neck: Supple, no JVD, carotid bruits, or masses noted.  Cardiac: Regular rate and rhythm. No murmurs, rubs, or gallops. No edema.  Respiratory:  Lungs are clear to auscultation bilaterally with normal work of breathing.  GI: Soft and nontender.  MS: No deformity or atrophy. Gait and ROM intact.  Skin: Warm and dry. Color is normal.  Neuro:  Strength and sensation are intact and no gross focal deficits noted.  Psych: Alert, appropriate and with normal affect.   LABORATORY DATA:  EKG:  EKG is ordered today. This demonstrates NSR with nonspecific changes.  Lab Results  Component Value Date   WBC 5.8 06/22/2016   HGB 12.2 06/22/2016   HCT 37.2 06/22/2016   PLT 204.0 06/22/2016   GLUCOSE 95 06/22/2016   CHOL 157 12/09/2014   TRIG 74.0 12/09/2014   HDL 65.00 12/09/2014   LDLDIRECT 64.0 12/21/2015   LDLCALC 77 12/09/2014   ALT 22 06/22/2016   AST 20 06/22/2016   NA 134 (L) 06/22/2016   K 4.8 06/22/2016   CL 99 06/22/2016   CREATININE 0.64 06/22/2016   BUN 18 06/22/2016   CO2 30 06/22/2016    TSH 0.41 06/22/2016   INR 1.18 05/31/2015   HGBA1C 6.2 (H) 04/27/2011    BNP (last 3 results) No results for input(s): BNP in the last 8760 hours.  ProBNP (last 3 results) No results for input(s): PROBNP in the last 8760 hours.   Other Studies Reviewed Today:  Echo Study Conclusions from 11/2015  - Left ventricle: The cavity size was normal. Systolic function was   vigorous. The estimated ejection fraction was in the range of 65%   to 70%. Wall motion was normal; there were no regional wall   motion abnormalities. There was an increased relative   contribution of atrial contraction to ventricular filling.   Doppler parameters are consistent with abnormal left ventricular   relaxation (grade 1 diastolic dysfunction). - Aortic valve: Poorly visualized. A bioprosthesis was present and   functioning normally. Although individual leaflets not adequately   visualized, there does appear to be some mild calcification. The   mean AV gradient with within normal limits for this valve at   48mmHg. Valve area (VTI): 1.06 cm^2. Valve area (Vmax): 1.06   cm^2. Valve area (Vmean): 1 cm^2. - Mitral valve: There was trivial regurgitation. - Tricuspid valve: There was trivial regurgitation.  Impressions:  - Normal LVF with grade 1 diastolic dysfunction. There is a stable   bioprosthesis in the AV position with no significant aortic   stenosis. The mean AV gradient has increased from 7 to 81mmHg   since echo of 2015.   Myoview Study Highlights 11/2015    The left ventricular ejection fraction is hyperdynamic (>65%).  Nuclear stress EF: 70%.  There was no ST segment deviation noted during stress.  The study is normal.  This is a low risk study.   Normal pharmacologic nuclear stress test with no evidence of prior infarct or ischemia.       Assessment/Plan: 1.   CAD, native vessel, with angina - very reminiscent of prior chest pain sydrnome: She has  had fairly recent Myoview. Do  not think she would be able to tolerate long acting nitrates given her headaches and plans for MRI. Have recommended cardiac catheterization with Dr. Burt Knack to further define. The patient understands that risks include but are not limited to stroke (1 in 1000), death (1 in 39), kidney failure [usually temporary] (1 in 500), bleeding (1 in 200), allergic reaction [possibly serious] (1 in 200), and agrees to proceed. Arranged for Friday with Dr. Burt Knack. Lab and CXR today.   2. Aortic valve disorder: Patient with history of bicuspid aortic valve stenosis. She is status post bioprosthetic aortic valve replacement. Most recent echo stable.   3. Hyperlipidemia: Treated with lovastatin  4. Carotid disease - last study in March - ICA's patent bilaterally.   Current medicines are reviewed with the patient today.  The patient does not have concerns regarding medicines other than what has been noted above.  The following changes have been made:  See above.  Labs/ tests ordered today include:    Orders Placed This Encounter  Procedures  . DG Chest 2 View  . Basic metabolic panel  . CBC  . Protime-INR  . APTT  . EKG 12-Lead     Disposition:   Further disposition pending.   Patient is agreeable to this plan and will call if any problems develop in the interim.   Signed: Burtis Junes, RN, ANP-C 07/04/2016 11:52 AM  [facility] [address] [place], [place]  [address] Phone: [phone] Fax: [phone]

## 2016-07-09 NOTE — Progress Notes (Signed)
Thanks!  [employer]

## 2016-07-18 ENCOUNTER — Ambulatory Visit
Admission: RE | Admit: 2016-07-18 | Discharge: 2016-07-18 | Disposition: A | Discharge status: Home / Self Care | Payer: PPO | Source: Ambulatory Visit | Attending: Family Medicine | Admitting: Family Medicine

## 2016-07-18 DIAGNOSIS — R519 Headache, unspecified: Secondary | ICD-10-CM

## 2016-07-18 DIAGNOSIS — R200 Anesthesia of skin: Secondary | ICD-10-CM

## 2016-07-18 DIAGNOSIS — R51 Headache: Secondary | ICD-10-CM | POA: Diagnosis not present

## 2016-07-18 DIAGNOSIS — R209 Unspecified disturbances of skin sensation: Secondary | ICD-10-CM

## 2016-07-18 DIAGNOSIS — R202 Paresthesia of skin: Secondary | ICD-10-CM

## 2016-07-18 MED ORDER — GADOBENATE DIMEGLUMINE 529 MG/ML IV SOLN
14.0000 mL | Freq: Once | INTRAVENOUS | Status: AC | PRN
  Administered 2016-07-18: 14 mL via INTRAVENOUS

## 2016-07-23 ENCOUNTER — Other Ambulatory Visit: Payer: Self-pay

## 2016-07-23 ENCOUNTER — Ambulatory Visit (INDEPENDENT_AMBULATORY_CARE_PROVIDER_SITE_OTHER): Payer: PPO | Admitting: Family Medicine

## 2016-07-23 ENCOUNTER — Encounter: Payer: Self-pay | Admitting: Family Medicine

## 2016-07-23 VITALS — BP 124/78 | HR 68 | Temp 97.4°F | Ht 62.5 in | Wt 152.8 lb

## 2016-07-23 DIAGNOSIS — B9789 Other viral agents as the cause of diseases classified elsewhere: Secondary | ICD-10-CM

## 2016-07-23 DIAGNOSIS — J449 Chronic obstructive pulmonary disease, unspecified: Secondary | ICD-10-CM

## 2016-07-23 DIAGNOSIS — J329 Chronic sinusitis, unspecified: Secondary | ICD-10-CM

## 2016-07-23 DIAGNOSIS — D490 Neoplasm of unspecified behavior of digestive system: Secondary | ICD-10-CM

## 2016-07-23 MED ORDER — PREDNISONE 20 MG PO TABS: ORAL_TABLET | ORAL | 0 refills | Status: DC

## 2016-07-23 NOTE — Progress Notes (Signed)
PCP: Garret Reddish, MD  Subjective:  Katherine Foley is a 65 y.o. year old very pleasant female patient who presents with sinusitis symptoms including nasal congestion, sinus tenderness. Also feels like this is getting down in her chest- she not onlyblows out green discharge but also has green sputum at times -other symptoms include: mild fatigue, mild SOB but has not used rescue inhaler -day of illness:3 -Symptoms show no change -previous treatments: rest/hydration -sick contacts/travel/risks: denies flu exposure. Grandson who she cares for has bene intermittently sick  ROS-denies fever, NVD, tooth pain  Pertinent Past Medical History-  Patient Active Problem List   Diagnosis Date Noted  . Depression 09/22/2015    Priority: High  . Former smoker 09/22/2015    Priority: High  . S/P AVR (aortic valve replacement) 11/06/2013    Priority: High  . Aortic stenosis 07/07/2010    Priority: High  . CAD, NATIVE VESSEL 05/17/2010    Priority: High  . COPD GOLD II  07/23/2007    Priority: High  . Hot flashes 03/22/2016    Priority: Medium  . Diastolic dysfunction 123456    Priority: Medium  . Carotid stenosis 05/17/2010    Priority: Medium  . HLD (hyperlipidemia) 08/29/2009    Priority: Medium  . Essential hypertension 04/14/2007    Priority: Medium  . Osteopenia 04/14/2007    Priority: Medium  . Allergic rhinitis 09/22/2015    Priority: Low  . Adenomatous colon polyp     Priority: Low  . GERD (gastroesophageal reflux disease) 06/04/2014    Priority: Low  . Chest pain at rest   . CAP (community acquired pneumonia)   . Herpes simplex virus (HSV) infection 01/16/2010    Medications- reviewed  Current Outpatient Prescriptions  Medication Sig Dispense Refill  . albuterol (PROVENTIL HFA;VENTOLIN HFA) 108 (90 Base) MCG/ACT inhaler Inhale 2 puffs into the lungs every 6 (six) hours as needed. 1 Inhaler 1  . aspirin 81 MG tablet Take 81 mg by mouth daily.      Marland Kitchen gabapentin  (NEURONTIN) 100 MG capsule Take 1 capsule (100 mg total) by mouth at bedtime. 30 capsule 5  . lovastatin (MEVACOR) 20 MG tablet Take 1 tablet (20 mg total) by mouth at bedtime. 30 tablet 11  . nitroGLYCERIN (NITROSTAT) 0.4 MG SL tablet Place 1 tablet (0.4 mg total) under the tongue every 5 (five) minutes as needed for chest pain. 25 tablet 3  . omeprazole (PRILOSEC) 40 MG capsule Take 1 capsule (40 mg total) by mouth daily. 90 capsule 3  . PRISTIQ 100 MG 24 hr tablet TAKE ONE TABLET BY MOUTH ONCE DAILY 30 tablet 5  . propranolol (INDERAL) 20 MG tablet TAKE ONE TABLET BY MOUTH TWICE DAILY 60 tablet 5  . rosuvastatin (CRESTOR) 40 MG tablet Take 40 mg by mouth daily.     . sertraline (ZOLOFT) 50 MG tablet TAKE ONE TABLET BY MOUTH ONCE DAILY 30 tablet 2  . SPIRIVA HANDIHALER 18 MCG inhalation capsule INHALE ONE DOSE BY MOUTH ONCE DAILY 30 capsule 3  . SYMBICORT 160-4.5 MCG/ACT inhaler INHALE TWO PUFFS INTO THE LUNGS TWICE DAILY 1 Inhaler 11   No current facility-administered medications for this visit.     Objective: BP 124/78 (BP Location: Left Arm, Patient Position: Sitting, Cuff Size: Normal)   Pulse 68   Temp 97.4 F (36.3 C) (Oral)   Ht 5' 2.5" (1.588 m)   Wt 152 lb 12.8 oz (69.3 kg)   SpO2 97%   BMI 27.50  kg/m  Gen: NAD, resting comfortably HEENT: Turbinates erythematous with green drainage, TM normal- perhaps mild increased pressure on left, pharynx mildly erythematous with no tonsilar exudate or edema, minimal maxillary sinus tenderness CV: RRR no murmurs rubs or gallops Lungs: CTAB no crackles, wheeze, rhonchi Ext: no edema Skin: warm, dry, no rash  Assessment/Plan:  Sinsusitis with baseline COPD Viral based on <10 days, no double sickening, lack of severity of symptoms in first 3 days. Educated on signs that bacterial infection may have developed (symptoms over 10 days, double sickening).   We are also concerned with her COPD for potential COPD exacerbation (slightly more  short of breath than normal, coughing up green sputum). On other hand- the sputum may be drainage from her sinuses.   We opted to trial course of prednisone and if not at least mildly improving by Wednesday- would call in doxycycline antibiotic. Would definitely start if worsening shortness of breath, increased sputum production  Finally, we reviewed reasons to return to care including if symptoms worsen or persist or new concerns arise (particularly fever or shortness of breath)  Meds ordered this encounter  Medications  . predniSONE (DELTASONE) 20 MG tablet    Sig: Take 2 pills for 3 days, 1 pill for 4 days    Dispense:  10 tablet    Refill:  0    Garret Reddish, MD

## 2016-07-23 NOTE — Progress Notes (Signed)
Pre visit review using our clinic review tool, if applicable. No additional management support is needed unless otherwise documented below in the visit note. 

## 2016-07-23 NOTE — Patient Instructions (Signed)
Sinsusitis with baseline COPD Viral based on <10 days, no double sickening, lack of severity of symptoms in first 3 days. Educated on signs that bacterial infection may have developed (symptoms over 10 days, double sickening).   We are also concerned with her COPD for potential COPD exacerbation (slightly more short of breath than normal, coughing up green sputum). On other hand- the sputum may be drainage from her sinuses.   We opted to trial course of prednisone and if not at least mildly improving by Wednesday- would call in doxycycline antibiotic. Would definitely start if worsening shortness of breath, increased sputum production  Finally, we reviewed reasons to return to care including if symptoms worsen or persist or new concerns arise (particularly fever or shortness of breath)  Meds ordered this encounter  Medications  . predniSONE (DELTASONE) 20 MG tablet    Sig: Take 2 pills for 3 days, 1 pill for 4 days    Dispense:  10 tablet    Refill:  0

## 2016-07-24 ENCOUNTER — Telehealth: Payer: Self-pay | Admitting: Family Medicine

## 2016-07-24 MED ORDER — DOXYCYCLINE HYCLATE 100 MG PO TABS: 100.0000 mg | ORAL_TABLET | Freq: Two times a day (BID) | ORAL | 0 refills | Status: DC

## 2016-07-24 NOTE — Telephone Encounter (Signed)
Sent in- please inform patient to start on Wednesday if she did not have any improvement over night on Tuesday night

## 2016-07-24 NOTE — Telephone Encounter (Signed)
Pt would like for you to know that she is not any better and would like to have a antibiotic.   Pharm:  [facility] on [address]

## 2016-07-25 NOTE — Telephone Encounter (Signed)
Spoke to pt, told her Rx for Doxycycline was sent to pharmacy yesterday to start if not improved. Pt said she was not aware. Told pt can start right away since not feeling better. Pt verbalized understanding.

## 2016-07-25 NOTE — Telephone Encounter (Signed)
Pt is calling back. Please call pt today

## 2016-08-07 ENCOUNTER — Ambulatory Visit (INDEPENDENT_AMBULATORY_CARE_PROVIDER_SITE_OTHER)
Admission: RE | Admit: 2016-08-07 | Discharge: 2016-08-07 | Disposition: A | Discharge status: Home / Self Care | Payer: PPO | Source: Ambulatory Visit | Attending: Family Medicine | Admitting: Family Medicine

## 2016-08-07 ENCOUNTER — Encounter: Payer: Self-pay | Admitting: Family Medicine

## 2016-08-07 ENCOUNTER — Ambulatory Visit (INDEPENDENT_AMBULATORY_CARE_PROVIDER_SITE_OTHER): Payer: PPO | Admitting: Family Medicine

## 2016-08-07 VITALS — BP 140/82 | HR 63 | Temp 97.5°F

## 2016-08-07 DIAGNOSIS — R233 Spontaneous ecchymoses: Secondary | ICD-10-CM

## 2016-08-07 DIAGNOSIS — M7989 Other specified soft tissue disorders: Secondary | ICD-10-CM | POA: Diagnosis not present

## 2016-08-07 DIAGNOSIS — M25532 Pain in left wrist: Secondary | ICD-10-CM

## 2016-08-07 NOTE — Patient Instructions (Signed)
Please go to [facility] - located [address] across the [address] from [place] - in the basement - Hours: 8:30-5:30 PM M-F. Do not need appointment.    Hematoma A hematoma is a collection of blood. The collection of blood can turn into a hard, painful lump under the skin. Your skin may turn blue or yellow if the hematoma is close to the surface of the skin. Most hematomas get better in a few weeks. Some hematomas are serious and need medical care. Hematomas can be very small or very big. Follow these instructions at home:  Apply ice to the injured area:  Put ice in a plastic bag.  Place a towel between your skin and the bag.  Leave the ice on for 20 minutes, 2-3 times a day for the first 1 to 2 days.  After the first 2 days, switch to using warm packs on the injured area.  Raise (elevate) the injured area to lessen pain and puffiness (swelling). You may also wrap the area with an elastic bandage. Make sure the bandage is not wrapped too tight.  If you have a painful hematoma on your leg or foot, you may use crutches for a couple days.  Only take medicines as told by your doctor. Get help right away if:  Your pain gets worse.  Your pain is not controlled with medicine.  You have a fever.  Your puffiness gets worse.  Your skin turns more blue or yellow.  Your skin over the hematoma breaks or starts bleeding.  Your hematoma is in your chest or belly (abdomen) and you are short of breath, feel weak, or have a change in consciousness.  Your hematoma is on your scalp and you have a headache that gets worse or a change in alertness or consciousness. This information is not intended to replace advice given to you by your health care provider. Make sure you discuss any questions you have with your health care provider. Document Released: 09/27/2004 Document Revised: 01/26/2016 Document Reviewed: 01/28/2013 Elsevier Interactive Patient Education  2017 [employer].

## 2016-08-07 NOTE — Progress Notes (Signed)
Subjective:  Katherine Foley is a 65 y.o. year old very pleasant female patient who presents for/with See problem oriented charting ROS- no fever, chills, nausea, expanding redness on the wrist. Intact distal sensation   Past Medical History-  Patient Active Problem List   Diagnosis Date Noted  . Depression 09/22/2015    Priority: High  . Former smoker 09/22/2015    Priority: High  . S/P AVR (aortic valve replacement) 11/06/2013    Priority: High  . Aortic stenosis 07/07/2010    Priority: High  . CAD, NATIVE VESSEL 05/17/2010    Priority: High  . COPD GOLD II  07/23/2007    Priority: High  . Hot flashes 03/22/2016    Priority: Medium  . Diastolic dysfunction 123456    Priority: Medium  . Carotid stenosis 05/17/2010    Priority: Medium  . HLD (hyperlipidemia) 08/29/2009    Priority: Medium  . Essential hypertension 04/14/2007    Priority: Medium  . Osteopenia 04/14/2007    Priority: Medium  . Allergic rhinitis 09/22/2015    Priority: Low  . Adenomatous colon polyp     Priority: Low  . GERD (gastroesophageal reflux disease) 06/04/2014    Priority: Low  . Chest pain at rest   . CAP (community acquired pneumonia)   . Herpes simplex virus (HSV) infection 01/16/2010    Medications- reviewed and updated Current Outpatient Prescriptions  Medication Sig Dispense Refill  . albuterol (PROVENTIL HFA;VENTOLIN HFA) 108 (90 Base) MCG/ACT inhaler Inhale 2 puffs into the lungs every 6 (six) hours as needed. 1 Inhaler 1  . aspirin 81 MG tablet Take 81 mg by mouth daily.      Marland Kitchen gabapentin (NEURONTIN) 100 MG capsule Take 1 capsule (100 mg total) by mouth at bedtime. 30 capsule 5  . lovastatin (MEVACOR) 20 MG tablet Take 1 tablet (20 mg total) by mouth at bedtime. 30 tablet 11  . nitroGLYCERIN (NITROSTAT) 0.4 MG SL tablet Place 1 tablet (0.4 mg total) under the tongue every 5 (five) minutes as needed for chest pain. 25 tablet 3  . omeprazole (PRILOSEC) 40 MG capsule Take 1 capsule (40  mg total) by mouth daily. 90 capsule 3  . PRISTIQ 100 MG 24 hr tablet TAKE ONE TABLET BY MOUTH ONCE DAILY 30 tablet 5  . propranolol (INDERAL) 20 MG tablet TAKE ONE TABLET BY MOUTH TWICE DAILY 60 tablet 5  . rosuvastatin (CRESTOR) 40 MG tablet Take 40 mg by mouth daily.     . sertraline (ZOLOFT) 50 MG tablet TAKE ONE TABLET BY MOUTH ONCE DAILY 30 tablet 2  . SPIRIVA HANDIHALER 18 MCG inhalation capsule INHALE ONE DOSE BY MOUTH ONCE DAILY 30 capsule 3  . SYMBICORT 160-4.5 MCG/ACT inhaler INHALE TWO PUFFS INTO THE LUNGS TWICE DAILY 1 Inhaler 11   No current facility-administered medications for this visit.     Objective: BP 140/82 (BP Location: Right Arm, Patient Position: Sitting, Cuff Size: Large)   Pulse 63   Temp 97.5 F (36.4 C) (Oral)   SpO2 96%  Gen: NAD, resting comfortably CV: RRR , 2+ radial and ulnar pulse Lungs: nonlabored breathing  Ext: no edema pretibial MSK: along left wrist 4 x 8 cm area Raised at least 2 cm- with surrounding bruising also noted.  Skin: warm, dry Neuro: grossly normal, moves all extremities, intact distal sensation      Assessment/Plan:  Spontaneous hematoma Left wrist pain S: Patient states earlier today she was sitting at [employer] and felt a  sudden stinging pain in left lower arm near wrist. She then noted the arm seemed to grow in front of her eyes- a raised bruised area. No pain without palpation. Pain only with palpation of raised area. Perhaps mild pain with flexion of wrist. Never had anything like this before. On coumadin. Denies trauma or injury A/P: appears to be spontaneous hematoma. Aspirin likely contributes. Rather large and speed of which it grew odd but now stabilized. Patient in no distress, no red flags except for pain with palpation- so we will get x-ray to rule out fracture though doubt fracture. Conservative care with icing regimen for 2-3 days then switch to heat.   Orders Placed This Encounter  Procedures  . DG Wrist  Complete Left    Standing Status:   Future    Number of Occurrences:   1    Standing Expiration Date:   10/08/2017    Order Specific Question:   Reason for Exam (SYMPTOM  OR DIAGNOSIS REQUIRED)    Answer:   left wrist pain- likely just spontaneous hematoma but pain with palpation over hematoma    Order Specific Question:   Preferred imaging location?    Answer:   [facility]   Return precautions advised- especially new or worsening symptoms.  Garret Reddish, MD

## 2016-08-07 NOTE — Progress Notes (Signed)
Pre visit review using our clinic review tool, if applicable. No additional management support is needed unless otherwise documented below in the visit note. 

## 2016-08-09 DIAGNOSIS — K119 Disease of salivary gland, unspecified: Secondary | ICD-10-CM | POA: Diagnosis not present

## 2016-08-10 ENCOUNTER — Encounter: Payer: Self-pay | Admitting: Family Medicine

## 2016-08-10 DIAGNOSIS — D490 Neoplasm of unspecified behavior of digestive system: Secondary | ICD-10-CM | POA: Insufficient documentation

## 2016-08-21 ENCOUNTER — Other Ambulatory Visit: Payer: Self-pay | Admitting: Family Medicine

## 2016-08-28 ENCOUNTER — Other Ambulatory Visit: Payer: Self-pay | Admitting: Family Medicine

## 2016-09-16 ENCOUNTER — Other Ambulatory Visit: Payer: Self-pay | Admitting: Family Medicine

## 2016-09-21 ENCOUNTER — Other Ambulatory Visit: Payer: Self-pay | Admitting: Family Medicine

## 2016-10-02 ENCOUNTER — Other Ambulatory Visit: Payer: Self-pay | Admitting: Family Medicine

## 2016-11-12 ENCOUNTER — Ambulatory Visit: Payer: PPO | Admitting: Family Medicine

## 2016-11-13 ENCOUNTER — Ambulatory Visit (INDEPENDENT_AMBULATORY_CARE_PROVIDER_SITE_OTHER): Payer: PPO | Admitting: Internal Medicine

## 2016-11-13 ENCOUNTER — Encounter: Payer: Self-pay | Admitting: Internal Medicine

## 2016-11-13 VITALS — BP 140/80 | HR 68 | Temp 97.5°F | Ht 62.5 in | Wt 155.0 lb

## 2016-11-13 DIAGNOSIS — J441 Chronic obstructive pulmonary disease with (acute) exacerbation: Secondary | ICD-10-CM

## 2016-11-13 DIAGNOSIS — J22 Unspecified acute lower respiratory infection: Secondary | ICD-10-CM

## 2016-11-13 MED ORDER — DOXYCYCLINE HYCLATE 100 MG PO TABS: 100.0000 mg | ORAL_TABLET | Freq: Two times a day (BID) | ORAL | 0 refills | Status: DC

## 2016-11-13 MED ORDER — PREDNISONE 20 MG PO TABS: ORAL_TABLET | ORAL | 0 refills | Status: DC

## 2016-11-13 NOTE — Progress Notes (Signed)
Chief Complaint  Patient presents with  . Cough  . Nasal Congestion  . Generalized Body Aches    back ache  . Nausea    HPI: Katherine Foley 66 y.o.  sda   PCP NA     Onset  5+ days ago .  Had acid reflux   before sx before and then sore throat and  Achy and worse  Monday had ur sx.   Coughing up  Phlegm  And green  Color   No blood. Some sob no hemoptysis    Face pain and headache  Also sinus area   Had hx of mi   Back pain.   And avr .   Chills  ? No fever.  Has hx of cv disases stent and avr and  Stage 2 copd  On  ihhjaler maintenacne  hh fo 2 1 dog.  ROS: See pertinent positives and negatives per HPI. No vomiting  Taking in po ok   Past Medical History:  Diagnosis Date  . Adenomatous colon polyp   . ANXIETY DEPRESSION 02/15/2009  . AORTIC STENOSIS 07/07/2010   a. echo 12/23/10: EF 60-65%, mod to severe AS with mean gradient 29 mmHg;  b. 04/2011 s/p bioprosthetic AVR.  Marland Kitchen Barrett's esophagus 04/14/2007  . Barrett's esophagus 2000  . CAD, NATIVE VESSEL 05/17/2010   a. NSTEMI 8/11 - BMS to RCA;  b. cath 4/12: dLAD 50%, RI 80% (PCI), AVCFX 30%, pRCA 30%, stent ok, 40-50, then 50% dist RCA;  c. s/p Promus DES to RI 12/25/10;  d. 04/2011 CABG x 1 (LIMA->LAD) @ time of AVR  . Carotid stenosis 05/17/2010   a. dopplers 70/96: RICA 28-36%; LICA 6-29% (follow up due 11/12)  . Chronic bronchitis ([place])    "multiple times" (03/25/2013)  . 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 Exertional shortness of breath    "related to COPD problems" (03/25/2013)  . GERD (gastroesophageal reflux disease)   . H/O hiatal hernia   . Hemorrhoid thrombosis    "had it lanced" (03/25/2013)  . Herpes simplex without mention of complication 4/76/5465  . History of blood transfusion    'w/OHS" (03/25/2013)  . [place], MILD 08/29/2009  . HYPERTENSION 04/14/2007  . MIGRAINE HEADACHE 04/20/2008  . Myocardial infarction 2011   "during catherization" (03/25/2013)  . OSTEOPENIA 04/14/2007  .  Ovarian cyst   . Pneumonia    "multiple times" (03/25/2013)  . URINARY INCONTINENCE, STRESS, MILD 08/26/2007  . Urine, incontinence, stress female     Family History  Problem Relation Age of Onset  . Coronary artery disease Other   . Heart disease Other     6 stents  . Heart disease Mother   . COPD Father     smoked  . Heart disease Father   . Rheum arthritis Father   . Heart disease Maternal Uncle   . Breast cancer Daughter   . Asthma Son     as a child   . Rheum arthritis Daughter   . Breast cancer Daughter   . Colon cancer Neg Hx   . Colon polyps Neg Hx     Social History   Social History  . Marital status: Single    Spouse name: N/A  . Number of children: 3  . Years of education: N/A   Occupational History  . [employer] [place] History Main Topics  . Smoking status: Former Smoker    Packs/day: 1.50    Years: 43.00  Types: Cigarettes    Quit date: 09/03/2005  . Smokeless tobacco: Never Used  . Alcohol use 0.0 oz/week  . Drug use: No  . Sexual activity: Not Asked   Other Topics Concern  . None   Social History Narrative   Prolonged engagement 17 years in 2017. 3 children. 6 grandkids. 2 greatgrandkids. Keeps greatgrandson on tuesdays- loves her so much.       Retired from [employer] (parttime- Dec 12)   Faith is very important to her      Hobbies: reading, cross stich, sew. Wants to learn to knit.           Outpatient Medications Prior to Visit  Medication Sig Dispense Refill  . albuterol (PROVENTIL HFA;VENTOLIN HFA) 108 (90 Base) MCG/ACT inhaler Inhale 2 puffs into the lungs every 6 (six) hours as needed. 1 Inhaler 1  . aspirin 81 MG tablet Take 81 mg by mouth daily.      Marland Kitchen gabapentin (NEURONTIN) 100 MG capsule TAKE ONE CAPSULE BY MOUTH AT BEDTIME 90 capsule 1  . lovastatin (MEVACOR) 20 MG tablet TAKE ONE TABLET BY MOUTH ONCE DAILY AT BEDTIME 30 tablet 11  . omeprazole (PRILOSEC) 40 MG capsule Take 1 capsule (40 mg total) by mouth daily. 90  capsule 3  . PRISTIQ 100 MG 24 hr tablet TAKE ONE TABLET BY MOUTH ONCE DAILY 30 tablet 5  . propranolol (INDERAL) 20 MG tablet TAKE ONE TABLET BY MOUTH TWICE DAILY 60 tablet 5  . rosuvastatin (CRESTOR) 40 MG tablet Take 40 mg by mouth daily.     . sertraline (ZOLOFT) 50 MG tablet TAKE ONE TABLET BY MOUTH ONCE DAILY 30 tablet 2  . sertraline (ZOLOFT) 50 MG tablet TAKE ONE TABLET BY MOUTH ONCE DAILY 30 tablet 5  . SPIRIVA HANDIHALER 18 MCG inhalation capsule INHALE ONE PUFF BY MOUTH ONCE DAILY 30 capsule 3  . SYMBICORT 160-4.5 MCG/ACT inhaler INHALE TWO PUFFS INTO THE LUNGS TWICE DAILY 1 Inhaler 11  . nitroGLYCERIN (NITROSTAT) 0.4 MG SL tablet Place 1 tablet (0.4 mg total) under the tongue every 5 (five) minutes as needed for chest pain. 25 tablet 3   No facility-administered medications prior to visit.      EXAM:  BP 140/80 (BP Location: Left Arm, Patient Position: Sitting, Cuff Size: Normal)   Pulse 68   Temp 97.5 F (36.4 C) (Oral)   Ht 5' 2.5" (1.588 m)   Wt 155 lb (70.3 kg)   SpO2 98%   BMI 27.90 kg/m   Body mass index is 27.9 kg/m. WDWN in NAD  quiet respirations;  Some inc work of breathing mildly congested  somewhat hoarse. Non toxic . HEENT: Normocephalic ;atraumatic , Eyes;  PERRL, EOMs  Full, lids and conjunctiva clear,,Ears: no deformities, canals nl, TM landmarks normal, Nose: no deformity or discharge but congested;face  tender Mouth : OP clear without lesion or edema .cobblestopning  Neck: Supple without adenopathy  Chest:   Dec bs but without wheezes rales or rhonchi wheezy bronchial cough  CV:  S1-S2  RR  No  Inc jvd no gallops or murmurs peripheral perfusion is normal Skin :nl perfusion and no acute rashes   G ASSESSMENT AND PLAN:  Discussed the following assessment and plan:  COPD exacerbation (HCC)  Acute respiratory infection Most likely acute exacerbation of COPD with respiratory infection possibly secondary bacterial high risk. Standing her inhalers use  beta agonist rescue Empiric antibiotic and short course prednisone. Discussed signs of pneumonia if she has fever  worsening consider chest x-ray and certainly follow-up with her health care team. Continue tobacco free and ETS free environment. Would use great caution with ibuprofen risk-benefit of medicine would only be used on a short-term basis prefers to use Tylenol for body aches and pain initially. -Patient advised to return or notify health care team  if symptoms worsen ,persist or new concerns arise.  Patient Instructions  This acts like a flareup of your COPD with infection and sinus infection. Continue your inhalers usual albuterol every 6 hours for chest tightness in wheezing. Begin antibiotic and a short course of prednisone. If you are getting fever and worsening with suggest we get a chest x-ray and follow-up with your primary doctor or her healthcare team.    Standley Brooking. Bascom Biel M.D.

## 2016-11-13 NOTE — Patient Instructions (Signed)
This acts like a flareup of your COPD with infection and sinus infection. Continue your inhalers usual albuterol every 6 hours for chest tightness in wheezing. Begin antibiotic and a short course of prednisone. If you are getting fever and worsening with suggest we get a chest x-ray and follow-up with your primary doctor or her healthcare team.

## 2016-11-15 ENCOUNTER — Other Ambulatory Visit: Payer: Self-pay | Admitting: Family Medicine

## 2016-11-20 ENCOUNTER — Ambulatory Visit: Payer: PPO | Admitting: Family Medicine

## 2016-11-28 ENCOUNTER — Encounter: Payer: Self-pay | Admitting: Physician Assistant

## 2016-11-29 ENCOUNTER — Encounter: Payer: Self-pay | Admitting: Family Medicine

## 2016-11-29 ENCOUNTER — Ambulatory Visit (INDEPENDENT_AMBULATORY_CARE_PROVIDER_SITE_OTHER): Payer: PPO | Admitting: Family Medicine

## 2016-11-29 VITALS — BP 128/82 | HR 64 | Temp 97.7°F | Ht 62.5 in | Wt 155.4 lb

## 2016-11-29 DIAGNOSIS — J329 Chronic sinusitis, unspecified: Secondary | ICD-10-CM | POA: Diagnosis not present

## 2016-11-29 DIAGNOSIS — B9689 Other specified bacterial agents as the cause of diseases classified elsewhere: Secondary | ICD-10-CM | POA: Diagnosis not present

## 2016-11-29 MED ORDER — PREDNISONE 20 MG PO TABS: ORAL_TABLET | ORAL | 0 refills | Status: DC

## 2016-11-29 MED ORDER — AMOXICILLIN-POT CLAVULANATE 875-125 MG PO TABS: 1.0000 | ORAL_TABLET | Freq: Two times a day (BID) | ORAL | 0 refills | Status: DC

## 2016-11-29 NOTE — Progress Notes (Signed)
PCP: Garret Reddish, MD  Subjective:  Katherine Foley is a 66 y.o. year old very pleasant female patient who presents with sinusitis symptoms including nasal congestion, sinus tenderness particularly Maxillary and frontal sinus pressure. Drainage white or clear. 2- 3 days. No fevers with it. Seen 2 weeks ago for copd exacerbation but had sinus pressure then as well. Got better but never went away. Also does have some sneezing  -other symptoms include: minimal cough -day of illness:well over 2 weeks of sinus pressure and congestion -Symptoms show no change/are not improving -previous treatments: as noted was treated for copd with doxycycline but never completely resolved issues -sick contacts/travel/risks: denies flu exposure.   ROS-denies fever, SOB, NVD, tooth pain. No recent wheeze.   Pertinent Past Medical History-  Patient Active Problem List   Diagnosis Date Noted  . 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 Depression 09/22/2015    Priority: High  . Former smoker 09/22/2015    Priority: High  . S/P AVR (aortic valve replacement) 11/06/2013    Priority: High  . Aortic stenosis 07/07/2010    Priority: High  . CAD, NATIVE VESSEL 05/17/2010    Priority: High  . COPD GOLD II  07/23/2007    Priority: High  . Hot flashes 03/22/2016    Priority: Medium  . Diastolic dysfunction 06/27/8526    Priority: Medium  . Carotid stenosis 05/17/2010    Priority: Medium  . HLD (hyperlipidemia) 08/29/2009    Priority: Medium  . Essential hypertension 04/14/2007    Priority: Medium  . Osteopenia 04/14/2007    Priority: Medium  . Allergic rhinitis 09/22/2015    Priority: Low  . Adenomatous colon polyp     Priority: Low  . GERD (gastroesophageal reflux disease) 06/04/2014    Priority: Low  . Chest pain at rest   . CAP (community acquired pneumonia)   . Herpes simplex virus (HSV) infection 01/16/2010    Medications- reviewed  Current Outpatient Prescriptions  Medication  Sig Dispense Refill  . albuterol (PROVENTIL HFA;VENTOLIN HFA) 108 (90 Base) MCG/ACT inhaler Inhale 2 puffs into the lungs every 6 (six) hours as needed. 1 Inhaler 1  . aspirin 81 MG tablet Take 81 mg by mouth daily.      Marland Kitchen desvenlafaxine (PRISTIQ) 100 MG 24 hr tablet TAKE ONE TABLET BY MOUTH ONCE DAILY 30 tablet 5  . doxycycline (VIBRA-TABS) 100 MG tablet Take 1 tablet (100 mg total) by mouth 2 (two) times daily. 14 tablet 0  . gabapentin (NEURONTIN) 100 MG capsule TAKE ONE CAPSULE BY MOUTH AT BEDTIME 90 capsule 1  . lovastatin (MEVACOR) 20 MG tablet TAKE ONE TABLET BY MOUTH ONCE DAILY AT BEDTIME 30 tablet 11  . omeprazole (PRILOSEC) 40 MG capsule Take 1 capsule (40 mg total) by mouth daily. 90 capsule 3  . propranolol (INDERAL) 20 MG tablet TAKE ONE TABLET BY MOUTH TWICE DAILY 60 tablet 5  . rosuvastatin (CRESTOR) 40 MG tablet Take 40 mg by mouth daily.     . sertraline (ZOLOFT) 50 MG tablet TAKE ONE TABLET BY MOUTH ONCE DAILY 30 tablet 2  . SPIRIVA HANDIHALER 18 MCG inhalation capsule INHALE ONE PUFF BY MOUTH ONCE DAILY 30 capsule 3  . SYMBICORT 160-4.5 MCG/ACT inhaler INHALE TWO PUFFS INTO THE LUNGS TWICE DAILY 1 Inhaler 11  . amoxicillin-clavulanate (AUGMENTIN) 875-125 MG tablet Take 1 tablet by mouth 2 (two) times daily. 14 tablet 0  . nitroGLYCERIN (NITROSTAT) 0.4 MG SL tablet Place 1  tablet (0.4 mg total) under the tongue every 5 (five) minutes as needed for chest pain. 25 tablet 3  . predniSONE (DELTASONE) 20 MG tablet Take 1 tablet by mouth daily for 5 days, then 1/2 tablet daily for 2 days 6 tablet 0   No current facility-administered medications for this visit.     Objective: BP 128/82 (BP Location: Left Arm, Patient Position: Sitting, Cuff Size: Large)   Pulse 64   Temp 97.7 F (36.5 C) (Oral)   Ht 5' 2.5" (1.588 m)   Wt 155 lb 6.4 oz (70.5 kg)   SpO2 95%   BMI 27.97 kg/m  Gen: NAD, resting comfortably HEENT: Turbinates erythematous with white/yellow drainage, TM normal,  pharynx mildly erythematous with no tonsilar exudate or edema, very tender in maxillary and frontal sinuses bilateral CV: RRR no murmurs rubs or gallops Lungs: CTAB no crackles, wheeze, rhonchi  Ext: no edema  Skin: warm, dry, no rash Neuro: grossly normal, moves all extremities  Assessment/Plan:  Sinsusitis Bacterial based on: Symptoms >10 days, double sickening  Treatment: -considered steroid: we opted to retrial this though was given course 2 weeks ago with doxycycline -other symptomatic care with mucinex rasonabl -Antibiotic indicated: yes In regard to allergy history-= augmentin and codeine used together and had rash and itching so they listed allergy for both.   Asks about allergic element- add zyrtec  Plus prednisone will help  Finally, we reviewed reasons to return to care including if symptoms worsen or persist or new concerns arise (particularly fever or shortness of breath)  Meds ordered this encounter  Medications  . amoxicillin-clavulanate (AUGMENTIN) 875-125 MG tablet    Sig: Take 1 tablet by mouth 2 (two) times daily.    Dispense:  14 tablet    Refill:  0  . predniSONE (DELTASONE) 20 MG tablet    Sig: Take 1 tablet by mouth daily for 5 days, then 1/2 tablet daily for 2 days    Dispense:  6 tablet    Refill:  0    Garret Reddish, MD

## 2016-11-29 NOTE — Patient Instructions (Signed)
Prednisone and augmentin for bacterial sinus infection  Follow up if not improved in 10 days or sooner if worsened particularly with fever or shortness of breath

## 2016-11-29 NOTE — Progress Notes (Signed)
Pre visit review using our clinic review tool, if applicable. No additional management support is needed unless otherwise documented below in the visit note. 

## 2016-12-06 ENCOUNTER — Other Ambulatory Visit: Payer: Self-pay | Admitting: Family Medicine

## 2016-12-12 ENCOUNTER — Ambulatory Visit (INDEPENDENT_AMBULATORY_CARE_PROVIDER_SITE_OTHER): Payer: PPO | Admitting: Physician Assistant

## 2016-12-12 ENCOUNTER — Encounter: Payer: Self-pay | Admitting: Physician Assistant

## 2016-12-12 VITALS — BP 138/82 | HR 72 | Resp 16 | Ht 62.5 in | Wt 154.0 lb

## 2016-12-12 DIAGNOSIS — I25118 Atherosclerotic heart disease of native coronary artery with other forms of angina pectoris: Secondary | ICD-10-CM

## 2016-12-12 DIAGNOSIS — I10 Essential (primary) hypertension: Secondary | ICD-10-CM | POA: Diagnosis not present

## 2016-12-12 DIAGNOSIS — E785 Hyperlipidemia, unspecified: Secondary | ICD-10-CM | POA: Diagnosis not present

## 2016-12-12 DIAGNOSIS — R002 Palpitations: Secondary | ICD-10-CM

## 2016-12-12 DIAGNOSIS — I359 Nonrheumatic aortic valve disorder, unspecified: Secondary | ICD-10-CM | POA: Diagnosis not present

## 2016-12-12 DIAGNOSIS — Z952 Presence of prosthetic heart valve: Secondary | ICD-10-CM | POA: Diagnosis not present

## 2016-12-12 MED ORDER — ISOSORBIDE MONONITRATE ER 30 MG PO TB24: 30.0000 mg | ORAL_TABLET | Freq: Every day | ORAL | 3 refills | Status: DC

## 2016-12-12 NOTE — Progress Notes (Signed)
Cardiology Office Note    Date:  12/12/2016   ID:  Katherine Foley, Katherine Foley 04-Jul-1951, MRN [id number]  PCP:  Garret Reddish, MD  Cardiologist:  Dr. Burt Knack   Chief Complaint: yearly follow up for CAD and AVR  History of Present Illness:   Katherine Foley is a 65 y.o. female with hx of CAD, s/p AVR, HLD, HTN, GERD, COPD and barrett's esophagus presents for follow up.   The patient has coronary artery disease and she underwent bare-metal stenting of the right coronary artery when she presented with non-ST elevation MI in 2011. The following year she underwent PCI of the left circumflex. She had been followed for aortic stenosis which progressed and she ultimately was treated with aortic valve replacement using a Magna Ease pericardial valve (21 mm) and LIMA-LAD graft in 2012.   Last cath 07/2016 for recurrent chest pain showed patent stent in RI and RCA; Atretic LIMA-LAD graft because of normal native LAD flow. Advised yearly echo due to increased mean gradient across the bioprosthesis aortic valve.   Here today for follow up. Patient has  intermittent sharp substernal chest pain radiating to her back. Associated with shortness of breath. Last for approximately 5 minutes and self resolved. She did not try any sublingual nitroglycerin. His has been becoming more frequent. Somewhat similar to when she had a stent placement. Symptoms occurs with and without exertion. The complaining of intermittent palpitation with shortness of breath lasting for a few seconds. She does not drink any caffeinated products. She does not exercise. Intermittent high blood pressure up to 190/100. She has a headache recently. Denies orthopnea, PND, syncope or dizziness.   Past Medical History:  Diagnosis Date  . Adenomatous colon polyp   . ANXIETY DEPRESSION 02/15/2009  . AORTIC STENOSIS 07/07/2010   a. echo 12/23/10: EF 60-65%, mod to severe AS with mean gradient 29 mmHg;  b. 04/2011 s/p bioprosthetic AVR.  Marland Kitchen Barrett's  esophagus 04/14/2007  . Barrett's esophagus 2000  . CAD, NATIVE VESSEL 05/17/2010   a. NSTEMI 8/11 - BMS to RCA;  b. cath 4/12: dLAD 50%, RI 80% (PCI), AVCFX 30%, pRCA 30%, stent ok, 40-50, then 50% dist RCA;  c. s/p Promus DES to RI 12/25/10;  d. 04/2011 CABG x 1 (LIMA->LAD) @ time of AVR  . Carotid stenosis 05/17/2010   a. dopplers 10/25: RICA 85-27%; LICA 7-82% (follow up due 11/12)  . Chronic bronchitis ([place])    "multiple times" (03/25/2013)  . 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 Exertional shortness of breath    "related to COPD problems" (03/25/2013)  . GERD (gastroesophageal reflux disease)   . H/O hiatal hernia   . Hemorrhoid thrombosis    "had it lanced" (03/25/2013)  . Herpes simplex without mention of complication 12/25/5359  . History of blood transfusion    'w/OHS" (03/25/2013)  . [place], MILD 08/29/2009  . HYPERTENSION 04/14/2007  . MIGRAINE HEADACHE 04/20/2008  . Myocardial infarction 2011   "during catherization" (03/25/2013)  . OSTEOPENIA 04/14/2007  . Ovarian cyst   . Pneumonia    "multiple times" (03/25/2013)  . URINARY INCONTINENCE, STRESS, MILD 08/26/2007  . Urine, incontinence, stress female     Past Surgical History:  Procedure Laterality Date  . AORTIC VALVE REPLACEMENT  2012  . APPENDECTOMY    . BLEPHAROPLASTY Bilateral ~ 1998  . CARDIAC CATHETERIZATION N/A 07/06/2016   Procedure: Left Heart Cath and Cors/Grafts Angiography;  Surgeon: Sherren Mocha, MD;  Location: [facility];  Service: Cardiovascular;  Laterality: N/A;  . CHOLECYSTECTOMY N/A 03/25/2013   Procedure: LAPAROSCOPIC CHOLECYSTECTOMY WITH INTRAOPERATIVE CHOLANGIOGRAM;  Surgeon: Imogene Burn. Georgette Dover, MD;  Location: [facility];  Service: General;  Laterality: N/A;  . CORONARY ANGIOPLASTY WITH STENT PLACEMENT  2010   "I have 2 stents" (03/25/2013)  . NASAL SINUS SURGERY  1997; 1998   "cust in maxillary sinus; put a stent in then removed it" (03/25/2013)  . OVARIAN CYST REMOVAL  Right 2011   "size of a soccer ball" (03/25/2013)  . thrombosed hemorrohid lanced    . TONSILLECTOMY AND ADENOIDECTOMY    . TOTAL ABDOMINAL HYSTERECTOMY      Current Medications: Prior to Admission medications   Medication Sig Start Date End Date Taking? Authorizing Provider  albuterol (PROVENTIL HFA;VENTOLIN HFA) 108 (90 Base) MCG/ACT inhaler Inhale 2 puffs into the lungs every 6 (six) hours as needed. 03/22/16   Marin Olp, MD  amoxicillin-clavulanate (AUGMENTIN) 875-125 MG tablet Take 1 tablet by mouth 2 (two) times daily. 11/29/16   Marin Olp, MD  aspirin 81 MG tablet Take 81 mg by mouth daily.      Historical Provider, MD  desvenlafaxine (PRISTIQ) 100 MG 24 hr tablet TAKE ONE TABLET BY MOUTH ONCE DAILY 11/15/16   Marin Olp, MD  gabapentin (NEURONTIN) 100 MG capsule TAKE ONE CAPSULE BY MOUTH AT BEDTIME 09/21/16   Marin Olp, MD  lovastatin (MEVACOR) 20 MG tablet TAKE ONE TABLET BY MOUTH ONCE DAILY AT BEDTIME 10/02/16   Marin Olp, MD  nitroGLYCERIN (NITROSTAT) 0.4 MG SL tablet Place 1 tablet (0.4 mg total) under the tongue every 5 (five) minutes as needed for chest pain. 07/04/16 10/02/16  Burtis Junes, NP  omeprazole (PRILOSEC) 40 MG capsule TAKE ONE CAPSULE BY MOUTH ONCE DAILY 12/07/16   Marin Olp, MD  predniSONE (DELTASONE) 20 MG tablet Take 1 tablet by mouth daily for 5 days, then 1/2 tablet daily for 2 days 11/29/16   Marin Olp, MD  propranolol (INDERAL) 20 MG tablet TAKE ONE TABLET BY MOUTH TWICE DAILY 08/28/16   Marin Olp, MD  rosuvastatin (CRESTOR) 40 MG tablet Take 40 mg by mouth daily.  01/02/16   Historical Provider, MD  sertraline (ZOLOFT) 50 MG tablet TAKE ONE TABLET BY MOUTH ONCE DAILY 05/18/16   Marin Olp, MD  SPIRIVA HANDIHALER 18 MCG inhalation capsule INHALE ONE PUFF BY MOUTH ONCE DAILY 09/17/16   Marin Olp, MD  SYMBICORT 160-4.5 MCG/ACT inhaler INHALE TWO PUFFS INTO THE LUNGS TWICE DAILY 03/19/16   Marin Olp,  MD    Allergies:   Codeine sulfate; Hydrocodone-acetaminophen; and Amoxicillin-pot clavulanate   Social History   Social History  . Marital status: Single    Spouse name: N/A  . Number of children: 3  . Years of education: N/A   Occupational History  . [employer] History Main Topics  . Smoking status: Former Smoker    Packs/day: 1.50    Years: 43.00    Types: Cigarettes    Quit date: 09/03/2005  . Smokeless tobacco: Never Used  . Alcohol use 0.0 oz/week  . Drug use: No  . Sexual activity: Not Asked   Other Topics Concern  . None   Social History Narrative   Prolonged engagement 17 years in 2017. 3 children. 6 grandkids. 2 greatgrandkids. Keeps greatgrandson on tuesdays- loves her so much.       Retired from [employer] (parttime- Dec 12)  Faith is very important to her      Hobbies: reading, cross stich, sew. Wants to learn to knit.            Family History:  The patient's family history includes Asthma in her son; Breast cancer in her daughter and daughter; COPD in her father; Coronary artery disease in her other; Heart disease in her father, maternal uncle, mother, and other; Rheum arthritis in her daughter and father.   ROS:   Please see the history of present illness.    ROS All other systems reviewed and are negative.   PHYSICAL EXAM:   VS:  BP 138/82   Pulse 72   Resp 16   Ht 5' 2.5" (1.588 m)   Wt 154 lb (69.9 kg)   SpO2 96%   BMI 27.72 kg/m    GEN: Well nourished, well developed, in no acute distress  HEENT: normal  Neck: no JVD, carotid bruits, or masses Cardiac: RRR. Systolic murmurs, rubs, or gallops,no edema  Respiratory:  clear to auscultation bilaterally, normal work of breathing GI: soft, nontender, nondistended, + BS MS: no deformity or atrophy  Skin: warm and dry, no rash Neuro:  Alert and Oriented x 3, Strength and sensation are intact Psych: euthymic mood, full affect  Wt Readings from Last 3 Encounters:  12/12/16 154  lb (69.9 kg)  11/29/16 155 lb 6.4 oz (70.5 kg)  11/13/16 155 lb (70.3 kg)      Studies/Labs Reviewed:   EKG:  EKG is not  ordered today.    Recent Labs: 06/22/2016: ALT 22; TSH 0.41 07/04/2016: BUN 14; Creat 0.70; Hemoglobin 12.8; Platelets 209; Potassium 4.5; Sodium 136   Lipid Panel    Component Value Date/Time   CHOL 157 12/09/2014 1109   TRIG 74.0 12/09/2014 1109   HDL 65.00 12/09/2014 1109   CHOLHDL 2 12/09/2014 1109   VLDL 14.8 12/09/2014 1109   LDLCALC 77 12/09/2014 1109   LDLDIRECT 64.0 12/21/2015 1153    Additional studies/ records that were reviewed today include:   Echocardiogram: 11/2015 Study Conclusions  - Left ventricle: The cavity size was normal. Systolic function was   vigorous. The estimated ejection fraction was in the range of 65%   to 70%. Wall motion was normal; there were no regional wall   motion abnormalities. There was an increased relative   contribution of atrial contraction to ventricular filling.   Doppler parameters are consistent with abnormal left ventricular   relaxation (grade 1 diastolic dysfunction). - Aortic valve: Poorly visualized. A bioprosthesis was present and   functioning normally. Although individual leaflets not adequately   visualized, there does appear to be some mild calcification. The   mean AV gradient with within normal limits for this valve at   62mmHg. Valve area (VTI): 1.06 cm^2. Valve area (Vmax): 1.06   cm^2. Valve area (Vmean): 1 cm^2. - Mitral valve: There was trivial regurgitation. - Tricuspid valve: There was trivial regurgitation.  Impressions:  - Normal LVF with grade 1 diastolic dysfunction. There is a stable   bioprosthesis in the AV position with no significant aortic   stenosis. The mean AV gradient has increased from 7 to 70mmHg   since echo of 2015.  Cardiac Catheterization: 07/2016 Procedures   Left Heart Cath and Cors/Grafts Angiography  Conclusion   1. Mildly dilated aorta with no  angiographic evidence of bioprosthetic aortic valve insufficiency 2. Widely patent coronary arteries with patent stents in the ramus intermedius and RCA 3. Atretic LIMA-LAD  graft because of normal native LAD flow  Suspect noncardiac symptoms. Would be reasonable to follow with annual echo studies since the mean gradient across the aortic bioprosthesis is mildly elevated compared with her previous echo    ASSESSMENT & PLAN:   1. Chest pain  - Her could be anginal equivalents. However cath  07/2016 showed  patent stent and atretic LIMA to LAD. She also has headache and elevated blood pressure. Advised to try sublingual nitroglycerin. Start Imdur. IF worsening symptoms she will let us know or go to ER. IF no improvement consider ischemic evaluation.  - Differential includes an aortic dissection given mildly dilated aorta on cath and recent elevated blood pressure. He has a good pulse on bilateral upper extremity. Will get echo first. IF concerning, consider CT of chest.   2. CAD s/p stenting to RI, RCA and LIMA to LAD - as above. Continue ASA and statin.   3. Aortic valve disorder s/p aortic valve replacement using a Magna Ease pericardial valve (21 mm)  in 2012.  - Last echo 11/2015 showed mean AV gradient has increased from 7 to 49mmHg since echo of 2015. Per cath mildly dilated aorta with no angiographic evidence of bioprosthetic aortic valve insufficiency.  - Will update echo  4. HTN - Elevated at home upto 190/100. BP of 138/82 today. Imdur as above. Continue Inderal 20mg  BID.   5. HLD - Continue statin. Followed by PCP.   6. Palpitations - occurs once a week for few seconds. Associated with SOB. Check TSH and Echo. Consider monitor if worsen.   Medication Adjustments/Labs and Tests Ordered: Current medicines are reviewed at length with the patient today.  Concerns regarding medicines are outlined above.  Medication changes, Labs and Tests ordered today are listed in the Patient  Instructions below. Patient Instructions  Medication Instructions:  Your physician has recommended you make the following change in your medication: Start Imdur 30 mg daily.   Labwork: Your physician recommends that you return for lab work today for TSH   Testing/Procedures: Your physician has requested that you have an echocardiogram. Echocardiography is a painless test that uses sound waves to create images of your heart. It provides your doctor with information about the size and shape of your heart and how well your heart's chambers and valves are working. This procedure takes approximately one hour. There are no restrictions for this procedure.    Follow-Up: Your physician recommends that you schedule a follow-up appointment in: 2-3 weeks with [facility] when Dr. Burt Knack is in the office.   Any Other Special Instructions Will Be Listed Below (If Applicable).     If you need a refill on your cardiac medications before your next appointment, please call your pharmacy.      Jarrett Soho, [place]  12/12/2016 4:07 PM    [facility] [address], [place], [place]  [address] Phone: [phone]; Fax: [phone]

## 2016-12-12 NOTE — Patient Instructions (Addendum)
Medication Instructions:  Your physician has recommended you make the following change in your medication: Start Imdur 30 mg daily.   Labwork: Your physician recommends that you return for lab work today for TSH   Testing/Procedures: Your physician has requested that you have an echocardiogram. Echocardiography is a painless test that uses sound waves to create images of your heart. It provides your doctor with information about the size and shape of your heart and how well your heart's chambers and valves are working. This procedure takes approximately one hour. There are no restrictions for this procedure.    Follow-Up: Your physician recommends that you schedule a follow-up appointment in: 2-3 weeks with [facility] when Dr. Burt Knack is in the office.   Any Other Special Instructions Will Be Listed Below (If Applicable).     If you need a refill on your cardiac medications before your next appointment, please call your pharmacy.

## 2016-12-13 ENCOUNTER — Other Ambulatory Visit: Payer: Self-pay

## 2016-12-13 DIAGNOSIS — R7989 Other specified abnormal findings of blood chemistry: Secondary | ICD-10-CM

## 2016-12-13 LAB — TSH: TSH: 0.384 u[IU]/mL — ABNORMAL LOW (ref 0.450–4.500)

## 2016-12-13 NOTE — Progress Notes (Signed)
Agree with plan. thanks

## 2016-12-14 ENCOUNTER — Encounter: Payer: Self-pay | Admitting: Physician Assistant

## 2016-12-21 ENCOUNTER — Encounter: Payer: Self-pay | Admitting: Family Medicine

## 2016-12-21 ENCOUNTER — Ambulatory Visit (INDEPENDENT_AMBULATORY_CARE_PROVIDER_SITE_OTHER): Payer: PPO | Admitting: Family Medicine

## 2016-12-21 VITALS — BP 138/82 | HR 61 | Temp 98.0°F | Ht 62.5 in | Wt 153.6 lb

## 2016-12-21 DIAGNOSIS — B373 Candidiasis of vulva and vagina: Secondary | ICD-10-CM | POA: Diagnosis not present

## 2016-12-21 DIAGNOSIS — B3731 Acute candidiasis of vulva and vagina: Secondary | ICD-10-CM

## 2016-12-21 DIAGNOSIS — J301 Allergic rhinitis due to pollen: Secondary | ICD-10-CM | POA: Diagnosis not present

## 2016-12-21 MED ORDER — FLUTICASONE PROPIONATE 50 MCG/ACT NA SUSP: 2.0000 | Freq: Every day | NASAL | 1 refills | Status: DC

## 2016-12-21 MED ORDER — FLUCONAZOLE 150 MG PO TABS: 150.0000 mg | ORAL_TABLET | Freq: Once | ORAL | 0 refills | Status: AC

## 2016-12-21 NOTE — Progress Notes (Signed)
Subjective:  Katherine Foley is a 66 y.o. year old very pleasant female patient who presents for/with See problem oriented charting ROS- no fever or chills. Denies shortness of breath at present. Denies chest pain. Infrequent cough   Past Medical History-  Patient Active Problem List   Diagnosis Date Noted  . 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 Depression 09/22/2015    Priority: High  . Former smoker 09/22/2015    Priority: High  . S/P AVR (aortic valve replacement) 11/06/2013    Priority: High  . Aortic stenosis 07/07/2010    Priority: High  . CAD, NATIVE VESSEL 05/17/2010    Priority: High  . COPD GOLD II  07/23/2007    Priority: High  . Hot flashes 03/22/2016    Priority: Medium  . Diastolic dysfunction 99/83/3825    Priority: Medium  . Carotid stenosis 05/17/2010    Priority: Medium  . HLD (hyperlipidemia) 08/29/2009    Priority: Medium  . Essential hypertension 04/14/2007    Priority: Medium  . Osteopenia 04/14/2007    Priority: Medium  . Allergic rhinitis 09/22/2015    Priority: Low  . Adenomatous colon polyp     Priority: Low  . GERD (gastroesophageal reflux disease) 06/04/2014    Priority: Low  . Chest pain at rest   . CAP (community acquired pneumonia)   . Herpes simplex virus (HSV) infection 01/16/2010    Medications- reviewed and updated Current Outpatient Prescriptions  Medication Sig Dispense Refill  . albuterol (PROVENTIL HFA;VENTOLIN HFA) 108 (90 Base) MCG/ACT inhaler Inhale 2 puffs into the lungs every 6 (six) hours as needed. 1 Inhaler 1  . aspirin 81 MG tablet Take 81 mg by mouth daily.      Marland Kitchen desvenlafaxine (PRISTIQ) 100 MG 24 hr tablet TAKE ONE TABLET BY MOUTH ONCE DAILY 30 tablet 5  . gabapentin (NEURONTIN) 100 MG capsule TAKE ONE CAPSULE BY MOUTH AT BEDTIME 90 capsule 1  . lovastatin (MEVACOR) 20 MG tablet TAKE ONE TABLET BY MOUTH ONCE DAILY AT BEDTIME 30 tablet 11  . omeprazole (PRILOSEC) 40 MG capsule TAKE ONE CAPSULE BY  MOUTH ONCE DAILY 90 capsule 3  . propranolol (INDERAL) 20 MG tablet TAKE ONE TABLET BY MOUTH TWICE DAILY 60 tablet 5  . rosuvastatin (CRESTOR) 40 MG tablet Take 40 mg by mouth daily.     . sertraline (ZOLOFT) 50 MG tablet TAKE ONE TABLET BY MOUTH ONCE DAILY 30 tablet 2  . SPIRIVA HANDIHALER 18 MCG inhalation capsule INHALE ONE PUFF BY MOUTH ONCE DAILY 30 capsule 3  . SYMBICORT 160-4.5 MCG/ACT inhaler INHALE TWO PUFFS INTO THE LUNGS TWICE DAILY 1 Inhaler 11  . fluconazole (DIFLUCAN) 150 MG tablet Take 1 tablet (150 mg total) by mouth once. 1 tablet 0  . fluticasone (FLONASE) 50 MCG/ACT nasal spray Place 2 sprays into both nostrils daily. 16 g 1  . isosorbide mononitrate (IMDUR) 30 MG 24 hr tablet Take 1 tablet (30 mg total) by mouth daily. (Patient not taking: Reported on 12/21/2016) 90 tablet 3  . nitroGLYCERIN (NITROSTAT) 0.4 MG SL tablet Place 1 tablet (0.4 mg total) under the tongue every 5 (five) minutes as needed for chest pain. 25 tablet 3   No current facility-administered medications for this visit.     Objective: BP 138/82 (BP Location: Left Arm, Patient Position: Sitting, Cuff Size: Normal)   Pulse 61   Temp 98 F (36.7 C) (Oral)   Ht 5' 2.5" (1.588 m)  Wt 153 lb 9.6 oz (69.7 kg)   SpO2 95%   BMI 27.65 kg/m  Gen: NAD, resting comfortably Nares normal other than clear rhinorrhea. TM normal. Oropharynx normal.  CV: RRR no murmurs rubs or gallops Lungs: CTAB no crackles, wheeze, rhonchi  Ext: no edema Skin: warm, dry  Assessment/Plan:  Seasonal allergic rhinitis due to pollen  Vaginal yeast infection S: Patient diagnosed with bacterial sinusitis about 3-4 weeks ago. She states she felt significantly better but never fully resolved. After antibiotic- symptoms seemed to worsen.Started mucinex after antibiotics which seemed to help some. She is no longer having the thick green sputum but instead now having clear rhiniorrhea for the most part. Is having some post nasal drainage  which is yellow. Has sinus pressure still.   Was having some severe headaches but stopped imdur yesterday and headache resolving- not having chest pain today.   Also after antibiotic developed vaginal itching without large amount of discharge. Monistat helped relieve symptoms on 1 and 3 day sourses but ultimately still came back  Review of chart shows prior allergies requiring singulair A/P: We discussed she has had a change in symptoms from thick green drainage to now primarily clear rhinorrhea. With history of allergies and incomplete response to antibiotics - suspect seasonal allergies may have more prominent role. Rx for flonase and advised zyrtec. Advised neti pot saline rinse. 3 week follow up planned if not improving. Sooner if worsening or new symptoms  She will remain off imdur due to severe headaches- has follow up with cariology anyway after upcoming echocardiogram she reports.  mucinex  Yeast infection- For vaginal itching with response but incomplete tomonistat- will use 1x fluconazole. Agrees to pelvic exam at next visit if not improved  3 weeks if not improving  Meds ordered this encounter  Medications  . fluticasone (FLONASE) 50 MCG/ACT nasal spray    Sig: Place 2 sprays into both nostrils daily.    Dispense:  16 g    Refill:  1  . fluconazole (DIFLUCAN) 150 MG tablet    Sig: Take 1 tablet (150 mg total) by mouth once.    Dispense:  1 tablet    Refill:  0    Return precautions advised.  Garret Reddish, MD

## 2016-12-21 NOTE — Patient Instructions (Addendum)
Likely yeast infection- take fluconazole. Hold cholesterol medicine on the day you take this.   This appears to be more allergies now given your exam and changing symptoms. You had pretty severe allergies in the past and we may have to add singulair back in - in the future.   Lets start flonase (sent in) and zyrtec (buy OTC and take nightly)  Follow up 3 weeks or sooner if worsening (particularly fever or increasing sinus pressure) - verbal/written in

## 2016-12-21 NOTE — Progress Notes (Signed)
Pre visit review using our clinic review tool, if applicable. No additional management support is needed unless otherwise documented below in the visit note. 

## 2016-12-26 ENCOUNTER — Ambulatory Visit (HOSPITAL_COMMUNITY): Payer: PPO | Attending: Cardiology

## 2016-12-26 ENCOUNTER — Other Ambulatory Visit: Payer: Self-pay

## 2016-12-26 DIAGNOSIS — I361 Nonrheumatic tricuspid (valve) insufficiency: Secondary | ICD-10-CM | POA: Insufficient documentation

## 2016-12-26 DIAGNOSIS — R002 Palpitations: Secondary | ICD-10-CM | POA: Insufficient documentation

## 2016-12-31 ENCOUNTER — Telehealth: Payer: Self-pay | Admitting: Physician Assistant

## 2016-12-31 NOTE — Telephone Encounter (Signed)
Patient returning call for results. Thanks!  

## 2017-01-01 NOTE — Progress Notes (Signed)
Cardiology Office Note    Date:  01/03/2017   ID:  Foley, Katherine 1950/12/13, MRN [id number]  PCP:  Garret Reddish, MD  Cardiologist: Dr. Burt Knack   Chief Complaint: Chest pain follow up  History of Present Illness:   Katherine Foley is a 66 y.o. female with hx of CAD, s/p AVR, HLD, HTN, GERD, COPD and barrett's esophagus presents for follow up.   The patient has coronary artery disease and she underwent bare-metal stenting of the right coronary artery when she presented with non-ST elevation MI in 2011. The following year she underwent PCI of the left circumflex. She had been followed for aortic stenosis which progressed and she ultimately  treated with aortic valve replacement using a Magna Ease pericardial valve (21 mm) and LIMA-LAD graft in 2012.   Last cath 07/2016 for recurrent chest pain showed patent stent in RI and RCA; Atretic LIMA-LAD graft because of normal native LAD flow. Advised yearly echo due to increased mean gradient across the bioprosthesis aortic valve.  Seen by me 12/12/16 for yearly follow up. Complained of CP, concerning for angina. Also has intermittent palpitations and elevated blood pressure. Added Imdur. Update echo to assess aortic valve and aorta.   Echo showed  Normal LV function and mean gradient of 11 mm Hg.   Her today for follow up. She took Imdur for 7-10 days and discontinue afterwards due to severe headache. No episodes of chest pain during this period of time. She had 3 separate episodes of CP after wards that resolved spontaneously without any intervention. Blood pressure usually runs high to 180s around lunchtime. She takes her p.m. dose of Inderal around 10 PM. He denies any shortness of breath, palpitations, lower extremity edema, melena, orthopnea, PND or syncope.   Past Medical History:  Diagnosis Date  . Adenomatous colon polyp   . ANXIETY DEPRESSION 02/15/2009  . AORTIC STENOSIS 07/07/2010   a. echo 12/23/10: EF 60-65%, mod to severe AS with  mean gradient 29 mmHg;  b. 04/2011 s/p bioprosthetic AVR.  Marland Kitchen Barrett's esophagus 04/14/2007  . Barrett's esophagus 2000  . CAD, NATIVE VESSEL 05/17/2010   a. NSTEMI 8/11 - BMS to RCA;  b. cath 4/12: dLAD 50%, RI 80% (PCI), AVCFX 30%, pRCA 30%, stent ok, 40-50, then 50% dist RCA;  c. s/p Promus DES to RI 12/25/10;  d. 04/2011 CABG x 1 (LIMA->LAD) @ time of AVR  . Carotid stenosis 05/17/2010   a. dopplers 33/35: RICA 45-62%; LICA 5-63% (follow up due 11/12)  . Chronic bronchitis ([place])    "multiple times" (03/25/2013)  . 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 Exertional shortness of breath    "related to COPD problems" (03/25/2013)  . GERD (gastroesophageal reflux disease)   . H/O hiatal hernia   . Hemorrhoid thrombosis    "had it lanced" (03/25/2013)  . Herpes simplex without mention of complication 8/93/7342  . History of blood transfusion    'w/OHS" (03/25/2013)  . [place], MILD 08/29/2009  . HYPERTENSION 04/14/2007  . MIGRAINE HEADACHE 04/20/2008  . Myocardial infarction [facility]) 2011   "during catherization" (03/25/2013)  . OSTEOPENIA 04/14/2007  . Ovarian cyst   . Pneumonia    "multiple times" (03/25/2013)  . URINARY INCONTINENCE, STRESS, MILD 08/26/2007  . Urine, incontinence, stress female     Past Surgical History:  Procedure Laterality Date  . AORTIC VALVE REPLACEMENT  2012  . APPENDECTOMY    . BLEPHAROPLASTY Bilateral ~ 1998  . CARDIAC CATHETERIZATION N/A  07/06/2016   Procedure: Left Heart Cath and Cors/Grafts Angiography;  Surgeon: Sherren Mocha, MD;  Location: [place] [facility];  Service: Cardiovascular;  Laterality: N/A;  . CHOLECYSTECTOMY N/A 03/25/2013   Procedure: LAPAROSCOPIC CHOLECYSTECTOMY WITH INTRAOPERATIVE CHOLANGIOGRAM;  Surgeon: Imogene Burn. Georgette Dover, MD;  Location: [place] [place];  Service: General;  Laterality: N/A;  . CORONARY ANGIOPLASTY WITH STENT PLACEMENT  2010   "I have 2 stents" (03/25/2013)  . NASAL SINUS SURGERY  1997; 1998   "cust in maxillary  sinus; put a stent in then removed it" (03/25/2013)  . OVARIAN CYST REMOVAL Right 2011   "size of a soccer ball" (03/25/2013)  . thrombosed hemorrohid lanced    . TONSILLECTOMY AND ADENOIDECTOMY    . TOTAL ABDOMINAL HYSTERECTOMY      Current Medications: Prior to Admission medications   Medication Sig Start Date End Date Taking? Authorizing Provider  albuterol (PROVENTIL HFA;VENTOLIN HFA) 108 (90 Base) MCG/ACT inhaler Inhale 2 puffs into the lungs every 6 (six) hours as needed. 03/22/16   Marin Olp, MD  aspirin 81 MG tablet Take 81 mg by mouth daily.      Historical Provider, MD  desvenlafaxine (PRISTIQ) 100 MG 24 hr tablet TAKE ONE TABLET BY MOUTH ONCE DAILY 11/15/16   Marin Olp, MD  fluticasone [facility]) 50 MCG/ACT nasal spray Place 2 sprays into both nostrils daily. 12/21/16   Marin Olp, MD  gabapentin (NEURONTIN) 100 MG capsule TAKE ONE CAPSULE BY MOUTH AT BEDTIME 09/21/16   Marin Olp, MD  isosorbide mononitrate (IMDUR) 30 MG 24 hr tablet Take 1 tablet (30 mg total) by mouth daily. Patient not taking: Reported on 12/21/2016 12/12/16 03/12/17  Leanor Kail, PA  lovastatin (MEVACOR) 20 MG tablet TAKE ONE TABLET BY MOUTH ONCE DAILY AT BEDTIME 10/02/16   Marin Olp, MD  nitroGLYCERIN (NITROSTAT) 0.4 MG SL tablet Place 1 tablet (0.4 mg total) under the tongue every 5 (five) minutes as needed for chest pain. 07/04/16 10/02/16  Burtis Junes, NP  omeprazole (PRILOSEC) 40 MG capsule TAKE ONE CAPSULE BY MOUTH ONCE DAILY 12/07/16   Marin Olp, MD  propranolol (INDERAL) 20 MG tablet TAKE ONE TABLET BY MOUTH TWICE DAILY 08/28/16   Marin Olp, MD  rosuvastatin (CRESTOR) 40 MG tablet Take 40 mg by mouth daily.  01/02/16   Historical Provider, MD  sertraline (ZOLOFT) 50 MG tablet TAKE ONE TABLET BY MOUTH ONCE DAILY 05/18/16   Marin Olp, MD  SPIRIVA HANDIHALER 18 MCG inhalation capsule INHALE ONE PUFF BY MOUTH ONCE DAILY 09/17/16   Marin Olp, MD    SYMBICORT 160-4.5 MCG/ACT inhaler INHALE TWO PUFFS INTO THE LUNGS TWICE DAILY 03/19/16   Marin Olp, MD    Allergies:   Codeine sulfate; Hydrocodone-acetaminophen; and Amoxicillin-pot clavulanate   Social History   Social History  . Marital status: Single    Spouse name: N/A  . Number of children: 3  . Years of education: N/A   Occupational History  . [place] History Main Topics  . Smoking status: Former Smoker    Packs/day: 1.50    Years: 43.00    Types: Cigarettes    Quit date: 09/03/2005  . Smokeless tobacco: Never Used  . Alcohol use 0.0 oz/week  . Drug use: No  . Sexual activity: Not Asked   Other Topics Concern  . None   Social History Narrative   Prolonged engagement 17 years in 2017. 3 children. 6 grandkids.  2 greatgrandkids. Keeps greatgrandson on tuesdays- loves her so much.       Retired from [employer] (parttime- Dec 12)   Faith is very important to her      Hobbies: reading, cross stich, sew. Wants to learn to knit.            Family History:  The patient's family history includes Asthma in her son; Breast cancer in her daughter and daughter; COPD in her father; Coronary artery disease in her other; Heart disease in her father, maternal uncle, mother, and other; Rheum arthritis in her daughter and father.   ROS:   Please see the history of present illness.    ROS All other systems reviewed and are negative.   PHYSICAL EXAM:   VS:  BP 134/84   Pulse 61   Ht 5' 2.5" (1.588 m)   Wt 153 lb 12 oz (69.7 kg)   SpO2 99%   BMI 27.67 kg/m    GEN: Well nourished, well developed, in no acute distress  HEENT: normal  Neck: no JVD, carotid bruits, or masses Cardiac: RRR; no murmurs, rubs, or gallops,no edema  Respiratory:  clear to auscultation bilaterally, normal work of breathing GI: soft, nontender, nondistended, + BS MS: no deformity or atrophy  Skin: warm and dry, no rash Neuro:  Alert and Oriented x 3, Strength and sensation are  intact Psych: euthymic mood, full affect  Wt Readings from Last 3 Encounters:  01/03/17 153 lb 12 oz (69.7 kg)  12/21/16 153 lb 9.6 oz (69.7 kg)  12/12/16 154 lb (69.9 kg)      Studies/Labs Reviewed:   EKG:  EKG is not ordered today.    Recent Labs: 06/22/2016: ALT 22 07/04/2016: BUN 14; Creat 0.70; Hemoglobin 12.8; Platelets 209; Potassium 4.5; Sodium 136 12/12/2016: TSH 0.384   Lipid Panel    Component Value Date/Time   CHOL 157 12/09/2014 1109   TRIG 74.0 12/09/2014 1109   HDL 65.00 12/09/2014 1109   CHOLHDL 2 12/09/2014 1109   VLDL 14.8 12/09/2014 1109   LDLCALC 77 12/09/2014 1109   LDLDIRECT 64.0 12/21/2015 1153    Additional studies/ records that were reviewed today include:   Echocardiogram: 12/26/16 Study Conclusions  - Left ventricle: The cavity size was normal. Wall thickness was   normal. Systolic function was vigorous. The estimated ejection   fraction was in the range of 65% to 70%. Wall motion was normal;   there were no regional wall motion abnormalities. Doppler   parameters are consistent with abnormal left ventricular   relaxation (grade 1 diastolic dysfunction). - Aortic valve: Bioprosthetic aortic valve is well seated. Peak   velocity (S): 244 cm/s. Mean gradient (S): 11 mm Hg. - Tricuspid valve: There was trivial regurgitation.  Impressions:  - Compared to the prior study, there has been no significant   interval change.  Cardiac Catheterization: 07/2016 Procedures   Left Heart Cath and Cors/Grafts Angiography  Conclusion   1. Mildly dilated aorta with no angiographic evidence of bioprosthetic aortic valve insufficiency 2. Widely patent coronary arteries with patent stents in the ramus intermedius and RCA 3. Atretic LIMA-LAD graft because of normal native LAD flow  Suspect noncardiac symptoms. Would be reasonable to follow with annual echo studies since the mean gradient across the aortic bioprosthesis is mildly elevated compared  with her previous echo       ASSESSMENT & PLAN:    1. Chest pain - No episode of chest pain while on Imdur, discontinued due  to severe headache. Her symptoms has been improved. Advised to try when necessary nitroglycerin as needed. Discussed initiation of amlodipine for possible coronary vasospasm. However, she wants to wait and watch for now.  2. CAD s/p stenting to RI, RCA and LIMA to LAD - as above. Continue ASA and statin.   3. Aortic valve disorder s/p aortic valve replacement using a Magna Ease pericardial valve (21 mm)  in 2012.  - Last echo 12/2016  showed improved mean AV gradient to 11 mm Hg from 59mm Hg 11/2015.   4. HTN - Intermittently elevated. Advised to take PM dose of Imdur around dinnertime.  5. HLD - Continue statin. Followed by PCP.   6. Palpitations -Normal TSH. Normal LVEF. No reoccurrence.     Medication Adjustments/Labs and Tests Ordered: Current medicines are reviewed at length with the patient today.  Concerns regarding medicines are outlined above.  Medication changes, Labs and Tests ordered today are listed in the Patient Instructions below. Patient Instructions  Medication Instructions:  Your physician recommends that you continue on your current medications as directed. Please refer to the Current Medication list given to you today.'  Labwork: None ordered  Testing/Procedures: None ordered  Follow-Up: Your physician recommends that you schedule a follow-up appointment in:    Any Other Special Instructions Will Be Listed Below (If Applicable).     If you need a refill on your cardiac medications before your next appointment, please call your pharmacy.      Jarrett Soho, PA  01/03/2017 10:14 AM    [address], [place], [place]  [address] Phone: [phone]; Fax: [phone]

## 2017-01-03 ENCOUNTER — Encounter: Payer: Self-pay | Admitting: Physician Assistant

## 2017-01-03 ENCOUNTER — Ambulatory Visit (INDEPENDENT_AMBULATORY_CARE_PROVIDER_SITE_OTHER): Payer: PPO | Admitting: Physician Assistant

## 2017-01-03 VITALS — BP 134/84 | HR 61 | Ht 62.5 in | Wt 153.8 lb

## 2017-01-03 DIAGNOSIS — Z952 Presence of prosthetic heart valve: Secondary | ICD-10-CM

## 2017-01-03 DIAGNOSIS — I25118 Atherosclerotic heart disease of native coronary artery with other forms of angina pectoris: Secondary | ICD-10-CM

## 2017-01-03 DIAGNOSIS — I10 Essential (primary) hypertension: Secondary | ICD-10-CM | POA: Diagnosis not present

## 2017-01-03 DIAGNOSIS — E785 Hyperlipidemia, unspecified: Secondary | ICD-10-CM

## 2017-01-03 NOTE — Patient Instructions (Addendum)
Medication Instructions:  Your physician recommends that you continue on your current medications as directed. Please refer to the Current Medication list given to you today.   Labwork: None ordered  Testing/Procedures: None ordered  Follow-Up: Your physician recommends that you schedule a follow-up appointment in: 4 MONTHS WITH DR. COOPER   Any Other Special Instructions Will Be Listed Below (If Applicable).     If you need a refill on your cardiac medications before your next appointment, please call your pharmacy.   

## 2017-01-17 ENCOUNTER — Other Ambulatory Visit: Payer: Self-pay | Admitting: Family Medicine

## 2017-01-21 ENCOUNTER — Other Ambulatory Visit: Payer: Self-pay

## 2017-01-23 ENCOUNTER — Telehealth: Payer: Self-pay | Admitting: Family Medicine

## 2017-01-23 NOTE — Telephone Encounter (Signed)
Dr. Yong Channel has filled out form for the Office of [facility].  Pt has applied for a conceal carry permit.  Left a message on home phone for the pt to pick up at the front desk.  Call back if any questions.

## 2017-01-23 NOTE — Telephone Encounter (Signed)
Misty pt calling not sure what form she did not bring one in or send one in.  Pt is aware that there is a $20.00 charge for this form and it has been faxed to the facility (01/23/17 at 1:38pm) and she will come by and pick it up and pay for it.

## 2017-01-31 ENCOUNTER — Other Ambulatory Visit: Payer: PPO

## 2017-02-01 ENCOUNTER — Other Ambulatory Visit (INDEPENDENT_AMBULATORY_CARE_PROVIDER_SITE_OTHER): Payer: PPO

## 2017-02-01 DIAGNOSIS — R946 Abnormal results of thyroid function studies: Secondary | ICD-10-CM

## 2017-02-01 DIAGNOSIS — R7989 Other specified abnormal findings of blood chemistry: Secondary | ICD-10-CM

## 2017-02-01 LAB — TSH: TSH: 0.55 u[IU]/mL (ref 0.35–4.50)

## 2017-02-18 ENCOUNTER — Other Ambulatory Visit: Payer: Self-pay | Admitting: Family Medicine

## 2017-02-19 NOTE — Telephone Encounter (Signed)
May give her enough to get to a visit with me but would like for you to help her schedule a visit within 30 days as we have only seen her for acute issues over last year and want to see her for regular follow up

## 2017-02-26 NOTE — Telephone Encounter (Signed)
° ° ° °  Pt has been scheduled for an appt on 03/15/17 and is asking if the below med can be refilled  sertraline (ZOLOFT) 50 MG tablet   [facility]

## 2017-02-27 ENCOUNTER — Other Ambulatory Visit: Payer: Self-pay | Admitting: Family Medicine

## 2017-02-27 NOTE — Telephone Encounter (Signed)
Yes thanks, can fill until appointment

## 2017-03-15 ENCOUNTER — Telehealth: Payer: Self-pay | Admitting: Family Medicine

## 2017-03-15 ENCOUNTER — Encounter: Payer: Self-pay | Admitting: Family Medicine

## 2017-03-15 ENCOUNTER — Ambulatory Visit (INDEPENDENT_AMBULATORY_CARE_PROVIDER_SITE_OTHER): Payer: PPO | Admitting: Family Medicine

## 2017-03-15 VITALS — BP 124/68 | HR 60 | Temp 97.4°F | Ht 62.5 in | Wt 153.8 lb

## 2017-03-15 DIAGNOSIS — I10 Essential (primary) hypertension: Secondary | ICD-10-CM

## 2017-03-15 DIAGNOSIS — R232 Flushing: Secondary | ICD-10-CM | POA: Diagnosis not present

## 2017-03-15 DIAGNOSIS — J449 Chronic obstructive pulmonary disease, unspecified: Secondary | ICD-10-CM | POA: Diagnosis not present

## 2017-03-15 DIAGNOSIS — F3342 Major depressive disorder, recurrent, in full remission: Secondary | ICD-10-CM

## 2017-03-15 DIAGNOSIS — Z87891 Personal history of nicotine dependence: Secondary | ICD-10-CM

## 2017-03-15 DIAGNOSIS — E785 Hyperlipidemia, unspecified: Secondary | ICD-10-CM

## 2017-03-15 DIAGNOSIS — Z0000 Encounter for general adult medical examination without abnormal findings: Secondary | ICD-10-CM | POA: Diagnosis not present

## 2017-03-15 MED ORDER — AMOXICILLIN-POT CLAVULANATE 875-125 MG PO TABS: 1.0000 | ORAL_TABLET | Freq: Two times a day (BID) | ORAL | 0 refills | Status: DC

## 2017-03-15 MED ORDER — PREDNISONE 20 MG PO TABS: 40.0000 mg | ORAL_TABLET | Freq: Every day | ORAL | 0 refills | Status: AC

## 2017-03-15 NOTE — Telephone Encounter (Signed)
Pharm needs clarification on prednisone

## 2017-03-15 NOTE — Assessment & Plan Note (Signed)
COPD- compliant with spiriva, symbicort, albuterol. 2 COPD flares within a year. 2 weeks of cough, congestion in the chest. Also sinus pressure for 2 weeks. Started back on zyrtec and nasal . Green sputum. High risk patient with underlying lung disease- will treat bacterial sinusitis with another round of augmentin- last time was 4 months ago. She denies allergy and didwell with it at that time. Will continue restarted flonase and zyrtec. Also use short course of prednisone  Will call in diflucan if gets yeast infection

## 2017-03-15 NOTE — Progress Notes (Signed)
Phone: [phone]  Subjective:  Patient presents today for their annual physical. Chief complaint-noted.   See problem oriented charting- ROS- full  review of systems was completed and negative except for: sinus pressure, chest congestion, intermittent chest pain- has seen cardiology, baseline shortness of breath and slightly increased with current illness.   The following were reviewed and entered/updated in epic: Past Medical History:  Diagnosis Date  . Adenomatous colon polyp   . ANXIETY DEPRESSION 02/15/2009  . AORTIC STENOSIS 07/07/2010   a. echo 12/23/10: EF 60-65%, mod to severe AS with mean gradient 29 mmHg;  b. 04/2011 s/p bioprosthetic AVR.  Marland Kitchen Barrett's esophagus 04/14/2007  . Barrett's esophagus 2000  . CAD, NATIVE VESSEL 05/17/2010   a. NSTEMI 8/11 - BMS to RCA;  b. cath 4/12: dLAD 50%, RI 80% (PCI), AVCFX 30%, pRCA 30%, stent ok, 40-50, then 50% dist RCA;  c. s/p Promus DES to RI 12/25/10;  d. 04/2011 CABG x 1 (LIMA->LAD) @ time of AVR  . Carotid stenosis 05/17/2010   a. dopplers 76/72: RICA 09-47%; LICA 0-96% (follow up due 11/12)  . Chronic bronchitis ([place])    "multiple times" (03/25/2013)  . 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 Exertional shortness of breath    "related to COPD problems" (03/25/2013)  . GERD (gastroesophageal reflux disease)   . H/O hiatal hernia   . Hemorrhoid thrombosis    "had it lanced" (03/25/2013)  . Herpes simplex without mention of complication 2/83/6629  . History of blood transfusion    'w/OHS" (03/25/2013)  . [place], MILD 08/29/2009  . HYPERTENSION 04/14/2007  . MIGRAINE HEADACHE 04/20/2008  . Myocardial infarction [facility]) 2011   "during catherization" (03/25/2013)  . OSTEOPENIA 04/14/2007  . Ovarian cyst   . Pneumonia    "multiple times" (03/25/2013)  . URINARY INCONTINENCE, STRESS, MILD 08/26/2007  . Urine, incontinence, stress female    Patient Active Problem List   Diagnosis Date Noted  . 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 Depression 09/22/2015    Priority: High  . Former smoker 09/22/2015    Priority: High  . S/P AVR (aortic valve replacement) 11/06/2013    Priority: High  . Aortic stenosis 07/07/2010    Priority: High  . CAD, NATIVE VESSEL 05/17/2010    Priority: High  . COPD GOLD II  07/23/2007    Priority: High  . Hot flashes 03/22/2016    Priority: Medium  . Diastolic dysfunction 47/65/4650    Priority: Medium  . Carotid stenosis 05/17/2010    Priority: Medium  . HLD (hyperlipidemia) 08/29/2009    Priority: Medium  . Essential hypertension 04/14/2007    Priority: Medium  . Osteopenia 04/14/2007    Priority: Medium  . Allergic rhinitis 09/22/2015    Priority: Low  . Adenomatous colon polyp     Priority: Low  . GERD (gastroesophageal reflux disease) 06/04/2014    Priority: Low  . Chest pain at rest   . CAP (community acquired pneumonia)   . Herpes simplex virus (HSV) infection 01/16/2010   Past Surgical History:  Procedure Laterality Date  . AORTIC VALVE REPLACEMENT  2012  . APPENDECTOMY    . BLEPHAROPLASTY Bilateral ~ 1998  . CARDIAC CATHETERIZATION N/A 07/06/2016   Procedure: Left Heart Cath and Cors/Grafts Angiography;  Surgeon: Sherren Mocha, MD;  Location: [place] [facility];  Service: Cardiovascular;  Laterality: N/A;  . CHOLECYSTECTOMY N/A 03/25/2013   Procedure: LAPAROSCOPIC CHOLECYSTECTOMY WITH INTRAOPERATIVE CHOLANGIOGRAM;  Surgeon: Rodman Key  Oren Section, MD;  Location: [place];  Service: General;  Laterality: N/A;  . CORONARY ANGIOPLASTY WITH STENT PLACEMENT  2010   "I have 2 stents" (03/25/2013)  . NASAL SINUS SURGERY  1997; 1998   "cust in maxillary sinus; put a stent in then removed it" (03/25/2013)  . OVARIAN CYST REMOVAL Right 2011   "size of a soccer ball" (03/25/2013)  . thrombosed hemorrohid lanced    . TONSILLECTOMY AND ADENOIDECTOMY    . TOTAL ABDOMINAL HYSTERECTOMY      Family History  Problem Relation Age of Onset  . Coronary  artery disease Other   . Heart disease Other        6 stents  . Heart disease Mother   . COPD Father        smoked  . Heart disease Father   . Rheum arthritis Father   . Heart disease Maternal Uncle   . Breast cancer Daughter   . Asthma Son        as a child   . Rheum arthritis Daughter   . Breast cancer Daughter   . Colon cancer Neg Hx   . Colon polyps Neg Hx     Medications- reviewed and updated Current Outpatient Prescriptions  Medication Sig Dispense Refill  . albuterol (PROVENTIL HFA;VENTOLIN HFA) 108 (90 Base) MCG/ACT inhaler Inhale 2 puffs into the lungs every 6 (six) hours as needed. 1 Inhaler 1  . aspirin 81 MG tablet Take 81 mg by mouth daily.      Marland Kitchen desvenlafaxine (PRISTIQ) 100 MG 24 hr tablet TAKE ONE TABLET BY MOUTH ONCE DAILY 30 tablet 5  . fluticasone (FLONASE) 50 MCG/ACT nasal spray Place 2 sprays into both nostrils daily. 16 g 1  . lovastatin (MEVACOR) 20 MG tablet TAKE ONE TABLET BY MOUTH ONCE DAILY AT BEDTIME 30 tablet 11  . omeprazole (PRILOSEC) 40 MG capsule TAKE ONE CAPSULE BY MOUTH ONCE DAILY 90 capsule 3  . propranolol (INDERAL) 20 MG tablet TAKE ONE TABLET BY MOUTH TWICE DAILY 180 tablet 1  . rosuvastatin (CRESTOR) 40 MG tablet Take 40 mg by mouth daily.     . sertraline (ZOLOFT) 50 MG tablet TAKE ONE TABLET BY MOUTH ONCE DAILY 30 tablet 2  . SPIRIVA HANDIHALER 18 MCG inhalation capsule INHALE ONE PUFF BY MOUTH ONCE DAILY 30 capsule 3  . SYMBICORT 160-4.5 MCG/ACT inhaler INHALE TWO PUFFS INTO THE LUNGS TWICE DAILY 1 Inhaler 11  . gabapentin (NEURONTIN) 100 MG capsule Take 100 mg by mouth daily.    . nitroGLYCERIN (NITROSTAT) 0.4 MG SL tablet Place 1 tablet (0.4 mg total) under the tongue every 5 (five) minutes as needed for chest pain. 25 tablet 3   No current facility-administered medications for this visit.     Allergies-reviewed and updated Allergies  Allergen Reactions  . Codeine Sulfate Rash    REACTION: unspecified  . Hydrocodone-Acetaminophen  Rash    REACTION: unspecified    Social History   Social History  . Marital status: Single    Spouse name: N/A  . Number of children: 3  . Years of education: N/A   Occupational History  . [place] History Main Topics  . Smoking status: Former Smoker    Packs/day: 1.50    Years: 43.00    Types: Cigarettes    Quit date: 09/03/2005  . Smokeless tobacco: Never Used  . Alcohol use 0.0 oz/week  . Drug use: No  . Sexual activity: Not Asked  Other Topics Concern  . None   Social History Narrative   Prolonged engagement 17 years in 2017. 3 children. 6 grandkids. 2 greatgrandkids. Keeps greatgrandson on tuesdays- loves her so much.       Retired from [employer] (parttime- Dec 12)   Faith is very important to her      Hobbies: reading, cross stich, sew. Wants to learn to knit.           Objective: BP 124/68 (BP Location: Left Arm, Patient Position: Sitting, Cuff Size: Large)   Pulse 60   Temp (!) 97.4 F (36.3 C) (Oral)   Ht 5' 2.5" (1.588 m)   Wt 153 lb 12.8 oz (69.8 kg)   SpO2 97%   BMI 27.68 kg/m  Gen: NAD, resting comfortably HEENT: Mucous membranes are moist. Oropharynx normal Neck: no thyromegaly CV: RRR no murmurs rubs or gallops Lungs: CTAB no crackles, wheeze, rhonchi Abdomen: soft/nontender/nondistended/normal bowel sounds. No rebound or guarding.  Ext: no edema Skin: warm, dry Neuro: grossly normal, moves all extremities, PERRLA  Assessment/Plan:  66 y.o. female presenting for annual physical.  Health Maintenance counseling: 1. Anticipatory guidance: Patient counseled regarding regular dental exams - wears dentures, eye exams - yearly for ccontacts, wearing seatbelts.  2. Risk factor reduction:  Advised patient of need for regular exercise and diet rich and fruits and vegetables to reduce risk of heart attack and stroke. Exercise- low energy lately with lung and sinus issues- hopeful she can get back to some exercise after treatment.  Diet-weight stable.  Encouraged to increase plant based portion of diet.  Wt Readings from Last 3 Encounters:  03/15/17 153 lb 12.8 oz (69.8 kg)  01/03/17 153 lb 12 oz (69.7 kg)  12/21/16 153 lb 9.6 oz (69.7 kg)  3. Immunizations/screenings/ancillary studies- discussed shingrix- she is going to consider getting it at her pharmacy Immunization History  Administered Date(s) Administered  . Influenza Split 06/19/2011, 07/02/2012  . Influenza Whole 08/03/2009  . Influenza, High Dose Seasonal PF 05/20/2015  . Influenza,inj,Quad PF,36+ Mos 05/21/2013, 05/14/2014  . Influenza-Unspecified 08/17/2016  . Pneumococcal Conjugate-13 06/14/2014  . Pneumococcal Polysaccharide-23 03/22/2016  . Td 08/29/2009  . Zoster 01/21/2016  4. Cervical cancer screening- never had abnormal pap smear- aged out 5. Breast cancer screening-  breast exam declined and mammogram 12/07/15- wants to do every other year.  6. Colon cancer screening - 07/14/2014- repeat 10 years 7. Skin cancer screening- has seen dermatology in past. Does not get a lot of sun exposure but uses sunscreen when out.   Status of chronic or acute concerns   Former smoker- quit 2007- over 60 pack years- discussed lung cancer screening program - she agrees  CAD but with chest pain. Had cath 07/2016 which was reassuring. Imdur caused headache. Tries nitroglycerin but causes headache as well- no worsening pattern.  S/p aortic valve replacement- followswith cardiology and gets regular echocardiograms.   HYperlipidemia- has been on lovastatin 20mg . Prior crestor 40mg  - unclear why changed. Update lipids today  GERD - controlled on prilosec 40mg   COPD GOLD II  COPD- compliant with spiriva, symbicort, albuterol. 2 COPD flares within a year. 2 weeks of cough, congestion in the chest. Also sinus pressure for 2 weeks. Started back on zyrtec and nasal . Green sputum. High risk patient with underlying lung disease- will treat bacterial sinusitis with another  round of augmentin- last time was 4 months ago. She denies allergy and didwell with it at that time. Will continue restarted flonase and zyrtec.  Also use short course of prednisone  Will call in diflucan if gets yeast infection  Essential hypertension HTN- controlled on propranolol alone  Hot flashes Gabapentin for hot flashes has helped some  Depression Depression- controlled on pristiq and zoloft with PHQ2 of 0   6 months verbal but suspect we will see each other sooner  Orders Placed This Encounter  Procedures  . Lipid panel    Standing Status:   Future    Standing Expiration Date:   03/15/2018  . CBC    Standing Status:   Future    Standing Expiration Date:   03/15/2018  . Comprehensive metabolic panel    [place]    Standing Status:   Future    Standing Expiration Date:   03/15/2018  . Ambulatory Referral for Lung Cancer Scre    Referral Priority:   Routine    Referral Type:   Consultation    Referral Reason:   Specialty Services Required    Number of Visits Requested:   1  . POCT Urinalysis Dipstick (Automated)    Standing Status:   Future    Standing Expiration Date:   04/15/2017    Meds ordered this encounter  Medications  . gabapentin (NEURONTIN) 100 MG capsule    Sig: Take 100 mg by mouth daily.  Marland Kitchen amoxicillin-clavulanate (AUGMENTIN) 875-125 MG tablet    Sig: Take 1 tablet by mouth 2 (two) times daily.    Dispense:  14 tablet    Refill:  0  . predniSONE (DELTASONE) 20 MG tablet    Sig: Take 2 tablets (40 mg total) by mouth daily with breakfast. Take 1 tablet by mouth daily for 5 days, then 1/2 tablet daily for 2 days    Dispense:  10 tablet    Refill:  0    Return precautions advised.   Garret Reddish, MD

## 2017-03-15 NOTE — Patient Instructions (Addendum)
We will call you within a week or two about your referral to lung cancer screening program. If you do not hear within 3 weeks, give [place] a call.   Use augmentin for sinus infection- see [place] back if not improved drastically within a week  Schedule a lab visit at the check out desk within 2 weeks. Return for future fasting labs meaning nothing but water after midnight please. Ok to take your medications with water.   discussed shingrix- she is going to consider getting it at her pharmacy

## 2017-03-15 NOTE — Telephone Encounter (Signed)
Called pharmacy and clarified 2 tablets daily with breakfast for 5 days

## 2017-03-15 NOTE — Assessment & Plan Note (Signed)
Gabapentin for hot flashes has helped some

## 2017-03-15 NOTE — Assessment & Plan Note (Signed)
HTN- controlled on propranolol alone

## 2017-03-15 NOTE — Assessment & Plan Note (Signed)
Depression- controlled on pristiq and zoloft with PHQ2 of 0

## 2017-03-18 ENCOUNTER — Other Ambulatory Visit: Payer: Self-pay | Admitting: Family Medicine

## 2017-03-19 ENCOUNTER — Other Ambulatory Visit: Payer: Self-pay | Admitting: Acute Care

## 2017-03-19 DIAGNOSIS — Z87891 Personal history of nicotine dependence: Secondary | ICD-10-CM

## 2017-03-20 ENCOUNTER — Other Ambulatory Visit (INDEPENDENT_AMBULATORY_CARE_PROVIDER_SITE_OTHER): Payer: PPO

## 2017-03-20 DIAGNOSIS — E785 Hyperlipidemia, unspecified: Secondary | ICD-10-CM | POA: Diagnosis not present

## 2017-03-20 LAB — POC URINALSYSI DIPSTICK (AUTOMATED)
BILIRUBIN UA: NEGATIVE
Glucose, UA: NEGATIVE
KETONES UA: NEGATIVE
Nitrite, UA: NEGATIVE
PH UA: 6.5 (ref 5.0–8.0)
Protein, UA: NEGATIVE
RBC UA: NEGATIVE
SPEC GRAV UA: 1.025 (ref 1.010–1.025)
Urobilinogen, UA: 0.2 E.U./dL

## 2017-03-20 LAB — COMPREHENSIVE METABOLIC PANEL
ALBUMIN: 4.1 g/dL (ref 3.5–5.2)
ALK PHOS: 68 U/L (ref 39–117)
ALT: 36 U/L — AB (ref 0–35)
AST: 27 U/L (ref 0–37)
BILIRUBIN TOTAL: 0.3 mg/dL (ref 0.2–1.2)
BUN: 13 mg/dL (ref 6–23)
CO2: 29 mEq/L (ref 19–32)
Calcium: 9.3 mg/dL (ref 8.4–10.5)
Chloride: 100 mEq/L (ref 96–112)
Creatinine, Ser: 0.65 mg/dL (ref 0.40–1.20)
GFR: 96.36 mL/min (ref >60.00)
GLUCOSE: 93 mg/dL (ref 70–99)
Potassium: 4.1 mEq/L (ref 3.5–5.1)
SODIUM: 137 meq/L (ref 135–145)
TOTAL PROTEIN: 6.2 g/dL (ref 6.0–8.3)

## 2017-03-20 LAB — CBC
HCT: 38.4 % (ref 36.0–46.0)
HEMOGLOBIN: 12.2 g/dL (ref 12.0–15.0)
MCHC: 31.9 g/dL (ref 30.0–36.0)
MCV: 88.8 fl (ref 78.0–100.0)
Platelets: 234 10*3/uL (ref 150.0–400.0)
RBC: 4.33 Mil/uL (ref 3.87–5.11)
RDW: 16.4 % — AB (ref 11.5–15.5)
WBC: 7.2 10*3/uL (ref 4.0–10.5)

## 2017-03-20 LAB — LIPID PANEL
CHOLESTEROL: 194 mg/dL (ref 0–200)
HDL: 73.7 mg/dL (ref >39.00)
LDL CALC: 92 mg/dL (ref 0–99)
NONHDL: 119.86
Total CHOL/HDL Ratio: 3
Triglycerides: 138 mg/dL (ref 0.0–149.0)
VLDL: 27.6 mg/dL (ref 0.0–40.0)

## 2017-03-21 ENCOUNTER — Other Ambulatory Visit: Payer: Self-pay

## 2017-03-21 DIAGNOSIS — E785 Hyperlipidemia, unspecified: Secondary | ICD-10-CM

## 2017-03-21 MED ORDER — ROSUVASTATIN CALCIUM 10 MG PO TABS: 10.0000 mg | ORAL_TABLET | Freq: Every day | ORAL | 1 refills | Status: DC

## 2017-03-28 ENCOUNTER — Other Ambulatory Visit: Payer: Self-pay | Admitting: Family Medicine

## 2017-03-28 NOTE — Progress Notes (Signed)
Subjective:   Katherine Foley is a 66 y.o. female who presents for Medicare Annual (Subsequent) preventive examination.  Review of Systems:  No ROS.  Medicare Wellness Visit. Additional risk factors are reflected in the social history.  Cardiac Risk Factors include: hypertension;advanced age (>65men, >30 women);dyslipidemia;family history of premature cardiovascular disease   Sleep patterns: Sleeps 6 hours, does not feel rested. Up to void x 1.  Home Safety/Smoke Alarms: Feels safe in home. Smoke alarms in place.  Living environment; residence and Firearm Safety: Lives with fiance in 2 story home.  Seat Belt Safety/Bike Helmet: Wears seat belt.   Counseling:   Eye Exam-Last exam 2017, yearly. Dr [facility]-Full dentures, gum care discussed.   Female:   Pap-N/A       Mammo-11/11/2015, dense tissue. [place] and biopsy performed. Would like to repeat every 2 years.    Dexa scan-12/21/2015, Osteopenia       CCS-Colonoscopy 07/14/2014, normal. Recall 10 years.      Objective:     Vitals: BP 132/64 (BP Location: Left Arm, Patient Position: Sitting, Cuff Size: Normal)   Pulse 70   Ht 5\' 3"  (1.6 m)   Wt 153 lb 9.6 oz (69.7 kg)   SpO2 97%   BMI 27.21 kg/m   Body mass index is 27.21 kg/m.   Tobacco History  Smoking Status  . Former Smoker  . Packs/day: 1.50  . Years: 43.00  . Types: Cigarettes  . Quit date: 09/03/2005  Smokeless Tobacco  . Never Used     Counseling given: Not Answered   Past Medical History:  Diagnosis Date  . Adenomatous colon polyp   . ANXIETY DEPRESSION 02/15/2009  . AORTIC STENOSIS 07/07/2010   a. echo 12/23/10: EF 60-65%, mod to severe AS with mean gradient 29 mmHg;  b. 04/2011 s/p bioprosthetic AVR.  Marland Kitchen Barrett's esophagus 04/14/2007  . Barrett's esophagus 2000  . CAD, NATIVE VESSEL 05/17/2010   a. NSTEMI 8/11 - BMS to RCA;  b. cath 4/12: dLAD 50%, RI 80% (PCI), AVCFX 30%, pRCA 30%, stent ok, 40-50, then 50% dist RCA;  c. s/p Promus DES to RI 12/25/10;   d. 04/2011 CABG x 1 (LIMA->LAD) @ time of AVR  . Carotid stenosis 05/17/2010   a. dopplers 37/85: RICA 88-50%; LICA 2-77% (follow up due 11/12)  . Chronic bronchitis ([place])    "multiple times" (03/25/2013)  . 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 Exertional shortness of breath    "related to COPD problems" (03/25/2013)  . GERD (gastroesophageal reflux disease)   . H/O hiatal hernia   . Hemorrhoid thrombosis    "had it lanced" (03/25/2013)  . Herpes simplex without mention of complication 12/13/8784  . History of blood transfusion    'w/OHS" (03/25/2013)  . [place], MILD 08/29/2009  . HYPERTENSION 04/14/2007  . MIGRAINE HEADACHE 04/20/2008  . Myocardial infarction [facility]) 2011   "during catherization" (03/25/2013)  . OSTEOPENIA 04/14/2007  . Ovarian cyst   . Pneumonia    "multiple times" (03/25/2013)  . URINARY INCONTINENCE, STRESS, MILD 08/26/2007  . Urine, incontinence, stress female    Past Surgical History:  Procedure Laterality Date  . AORTIC VALVE REPLACEMENT  2012  . APPENDECTOMY    . BLEPHAROPLASTY Bilateral ~ 1998  . CARDIAC CATHETERIZATION N/A 07/06/2016   Procedure: Left Heart Cath and Cors/Grafts Angiography;  Surgeon: Sherren Mocha, MD;  Location: [place] [facility];  Service: Cardiovascular;  Laterality: N/A;  . CHOLECYSTECTOMY N/A 03/25/2013   Procedure: LAPAROSCOPIC  CHOLECYSTECTOMY WITH INTRAOPERATIVE CHOLANGIOGRAM;  Surgeon: Imogene Burn. Georgette Dover, MD;  Location: [place];  Service: General;  Laterality: N/A;  . CORONARY ANGIOPLASTY WITH STENT PLACEMENT  2010   "I have 2 stents" (03/25/2013)  . NASAL SINUS SURGERY  1997; 1998   "cust in maxillary sinus; put a stent in then removed it" (03/25/2013)  . OVARIAN CYST REMOVAL Right 2011   "size of a soccer ball" (03/25/2013)  . thrombosed hemorrohid lanced    . TONSILLECTOMY AND ADENOIDECTOMY    . TOTAL ABDOMINAL HYSTERECTOMY     Family History  Problem Relation Age of Onset  . Coronary artery disease Other     . Heart disease Other        6 stents  . Heart disease Mother   . COPD Father        smoked  . Heart disease Father   . Rheum arthritis Father   . Heart disease Maternal Uncle   . Breast cancer Daughter   . Asthma Son        as a child   . Rheum arthritis Daughter   . Breast cancer Daughter   . Colon cancer Neg Hx   . Colon polyps Neg Hx    History  Sexual Activity  . Sexual activity: Not on file    Outpatient Encounter Prescriptions as of 03/29/2017  Medication Sig  . albuterol (PROVENTIL HFA;VENTOLIN HFA) 108 (90 Base) MCG/ACT inhaler Inhale 2 puffs into the lungs every 6 (six) hours as needed.  Marland Kitchen aspirin 81 MG tablet Take 81 mg by mouth daily.    Marland Kitchen desvenlafaxine (PRISTIQ) 100 MG 24 hr tablet TAKE ONE TABLET BY MOUTH ONCE DAILY  . fluticasone (FLONASE) 50 MCG/ACT nasal spray Place 2 sprays into both nostrils daily.  Marland Kitchen gabapentin (NEURONTIN) 100 MG capsule Take 100 mg by mouth daily.  . Multiple Vitamins-Minerals (MULTIVITAMIN ADULT PO) Take by mouth.  . nitroGLYCERIN (NITROSTAT) 0.4 MG SL tablet Place 1 tablet (0.4 mg total) under the tongue every 5 (five) minutes as needed for chest pain.  Marland Kitchen omeprazole (PRILOSEC) 40 MG capsule TAKE ONE CAPSULE BY MOUTH ONCE DAILY  . propranolol (INDERAL) 20 MG tablet TAKE ONE TABLET BY MOUTH TWICE DAILY  . rosuvastatin (CRESTOR) 10 MG tablet Take 1 tablet (10 mg total) by mouth daily.  . sertraline (ZOLOFT) 50 MG tablet TAKE ONE TABLET BY MOUTH ONCE DAILY  . SPIRIVA HANDIHALER 18 MCG inhalation capsule INHALE ONE PUFF BY MOUTH ONCE DAILY  . SYMBICORT 160-4.5 MCG/ACT inhaler INHALE TWO PUFFS BY MOUTH TWICE DAILY  . [DISCONTINUED] amoxicillin-clavulanate (AUGMENTIN) 875-125 MG tablet Take 1 tablet by mouth 2 (two) times daily. (Patient not taking: Reported on 03/29/2017)  . [DISCONTINUED] sertraline (ZOLOFT) 50 MG tablet TAKE 1 TABLET BY MOUTH ONCE DAILY  . [DISCONTINUED] SYMBICORT 160-4.5 MCG/ACT inhaler INHALE TWO PUFFS INTO THE LUNGS TWICE  DAILY   No facility-administered encounter medications on file as of 03/29/2017.     Activities of Daily Living In your present state of health, do you have any difficulty performing the following activities: 03/29/2017 07/06/2016  Hearing? N N  Vision? N N  Difficulty concentrating or making decisions? N N  Walking or climbing stairs? N N  Dressing or bathing? N N  Doing errands, shopping? N -  Preparing Food and eating ? N -  Using the Toilet? N -  In the past six months, have you accidently leaked urine? N -  Do you have problems with loss of bowel  control? N -  Managing your Medications? N -  Managing your Finances? N -  Housekeeping or managing your Housekeeping? N -  Some recent data might be hidden    Patient Care Team: Marin Olp, MD as PCP - General (Family Medicine) Bensimhon, Shaune Pascal, MD (Cardiology) Sherren Mocha, MD (Cardiology) Tanda Rockers, MD as Consulting Physician (Pulmonary Disease) Jamesetta Geralds, DC (Chiropractic Medicine)    Assessment:    Physical assessment deferred to PCP.  Exercise Activities and Dietary recommendations Exercise limited by: None identified   Diet (meal preparation, eat out, water intake, caffeinated beverages, dairy products, fruits and vegetables): Protein shake and water.   Breakfast: cereal, fruit, nuts; coffee Lunch: english muffin with cheese Dinner: protein, vegetables.     Discussed heart healthy diet and activity.  Goals    . Weight (lb) < 145 lb (65.8 kg)          Lose weight by watching caloric intake.       Fall Risk Fall Risk  03/29/2017 03/22/2016 06/24/2015 06/14/2014  Falls in the past year? Yes No No No  Number falls in past yr: 2 or more - - -  Injury with Fall? No - - -  Follow up Falls prevention discussed - - -   Depression Screen PHQ 2/9 Scores 03/29/2017 03/22/2016 06/24/2015 06/14/2014  PHQ - 2 Score 0 0 0 0    Often feels guilty about leaving fiance at home alone.   Cognitive  Function       Ad8 score reviewed for issues:  Issues making decisions: no  Less interest in hobbies / activities: no  Repeats questions, stories (family complaining): no  Trouble using ordinary gadgets (microwave, computer, phone): no  Forgets the month or year: no  Mismanaging finances: no  Remembering appts: no  Daily problems with thinking and/or memory: no Ad8 score is=0     Immunization History  Administered Date(s) Administered  . Influenza Split 06/19/2011, 07/02/2012  . Influenza Whole 08/03/2009  . Influenza, High Dose Seasonal PF 05/20/2015  . Influenza,inj,Quad PF,36+ Mos 05/21/2013, 05/14/2014  . Influenza-Unspecified 08/17/2016  . Pneumococcal Conjugate-13 06/14/2014  . Pneumococcal Polysaccharide-23 03/22/2016  . Td 08/29/2009  . Zoster 01/21/2016   Discussed Shingrix Vaccine, declines for now.  Screening Tests Health Maintenance  Topic Date Due  . INFLUENZA VACCINE  04/03/2017  . MAMMOGRAM  12/06/2017  . TETANUS/TDAP  08/30/2019  . COLONOSCOPY  07/14/2024  . DEXA SCAN  Completed  . Hepatitis C Screening  Completed  . PNA vac Low Risk Adult  Completed      Plan:    Bring a copy of your advance directives to your next office visit.  Continue doing brain stimulating activities (puzzles, reading, adult coloring books, staying active) to keep memory sharp.     I have personally reviewed and noted the following in the patient's chart:   . Medical and social history . Use of alcohol, tobacco or illicit drugs  . Current medications and supplements . Functional ability and status . Nutritional status . Physical activity . Advanced directives . List of other physicians . Hospitalizations, surgeries, and ER visits in previous 12 months . Vitals . Screenings to include cognitive, depression, and falls . Referrals and appointments  In addition, I have reviewed and discussed with patient certain preventive protocols, quality metrics, and  best practice recommendations. A written personalized care plan for preventive services as well as general preventive health recommendations were provided to patient.  Gerilyn Nestle, RN  03/29/2017

## 2017-03-29 ENCOUNTER — Ambulatory Visit (INDEPENDENT_AMBULATORY_CARE_PROVIDER_SITE_OTHER): Payer: PPO

## 2017-03-29 VITALS — BP 132/64 | HR 70 | Ht 63.0 in | Wt 153.6 lb

## 2017-03-29 DIAGNOSIS — Z0000 Encounter for general adult medical examination without abnormal findings: Secondary | ICD-10-CM

## 2017-03-29 NOTE — Progress Notes (Signed)
I have reviewed and agree with note, evaluation, plan.   Stephen Hunter, MD  

## 2017-03-29 NOTE — Patient Instructions (Addendum)
[name] , Thank you for taking time to come for your Medicare Wellness Visit. I appreciate your ongoing commitment to your health goals. Please review the following plan we discussed and let me know if I can assist you in the future.   These are the goals we discussed: Goals    . Weight (lb) < 145 lb (65.8 kg)          Lose weight by watching caloric intake.        This is a list of the screening recommended for you and due dates:  Health Maintenance  Topic Date Due  . Flu Shot  04/03/2017  . Mammogram  12/06/2017  . Tetanus Vaccine  08/30/2019  . Colon Cancer Screening  07/14/2024  . DEXA scan (bone density measurement)  Completed  .  Hepatitis C: One time screening is recommended by Center for Disease Control  (CDC) for  adults born from [birth year] through 1967.   Completed  . Pneumonia vaccines  Completed   Bring a copy of your advance directives to your next office visit.  Continue doing brain stimulating activities (puzzles, reading, adult coloring books, staying active) to keep memory sharp.      Fall Prevention in the Home Falls can cause injuries. They can happen to people of all ages. There are many things you can do to make your home safe and to help prevent falls. What can I do on the outside of my home?  Regularly fix the edges of walkways and driveways and fix any cracks.  Remove anything that might make you trip as you walk through a door, such as a raised step or threshold.  Trim any bushes or trees on the path to your home.  Use bright outdoor lighting.  Clear any walking paths of anything that might make someone trip, such as rocks or tools.  Regularly check to see if handrails are loose or broken. Make sure that both sides of any steps have handrails.  Any raised decks and porches should have guardrails on the edges.  Have any leaves, snow, or ice cleared regularly.  Use sand or salt on walking paths during winter.  Clean up any spills in your garage  right away. This includes oil or grease spills. What can I do in the bathroom?  Use night lights.  Install grab bars by the toilet and in the tub and shower. Do not use towel bars as grab bars.  Use non-skid mats or decals in the tub or shower.  If you need to sit down in the shower, use a plastic, non-slip stool.  Keep the floor dry. Clean up any water that spills on the floor as soon as it happens.  Remove soap buildup in the tub or shower regularly.  Attach bath mats securely with double-sided non-slip rug tape.  Do not have throw rugs and other things on the floor that can make you trip. What can I do in the bedroom?  Use night lights.  Make sure that you have a light by your bed that is easy to reach.  Do not use any sheets or blankets that are too big for your bed. They should not hang down onto the floor.  Have a firm chair that has side arms. You can use this for support while you get dressed.  Do not have throw rugs and other things on the floor that can make you trip. What can I do in the kitchen?  Clean up any  spills right away.  Avoid walking on wet floors.  Keep items that you use a lot in easy-to-reach places.  If you need to reach something above you, use a strong step stool that has a grab bar.  Keep electrical cords out of the way.  Do not use floor polish or wax that makes floors slippery. If you must use wax, use non-skid floor wax.  Do not have throw rugs and other things on the floor that can make you trip. What can I do with my stairs?  Do not leave any items on the stairs.  Make sure that there are handrails on both sides of the stairs and use them. Fix handrails that are broken or loose. Make sure that handrails are as long as the stairways.  Check any carpeting to make sure that it is firmly attached to the stairs. Fix any carpet that is loose or worn.  Avoid having throw rugs at the top or bottom of the stairs. If you do have throw rugs,  attach them to the floor with carpet tape.  Make sure that you have a light switch at the top of the stairs and the bottom of the stairs. If you do not have them, ask someone to add them for you. What else can I do to help prevent falls?  Wear shoes that: ? Do not have high heels. ? Have rubber bottoms. ? Are comfortable and fit you well. ? Are closed at the toe. Do not wear sandals.  If you use a stepladder: ? Make sure that it is fully opened. Do not climb a closed stepladder. ? Make sure that both sides of the stepladder are locked into place. ? Ask someone to hold it for you, if possible.  Clearly mark and make sure that you can see: ? Any grab bars or handrails. ? First and last steps. ? Where the edge of each step is.  Use tools that help you move around (mobility aids) if they are needed. These include: ? Canes. ? Walkers. ? Scooters. ? Crutches.  Turn on the lights when you go into a dark area. Replace any light bulbs as soon as they burn out.  Set up your furniture so you have a clear path. Avoid moving your furniture around.  If any of your floors are uneven, fix them.  If there are any pets around you, be aware of where they are.  Review your medicines with your doctor. Some medicines can make you feel dizzy. This can increase your chance of falling. Ask your doctor what other things that you can do to help prevent falls. This information is not intended to replace advice given to you by your health care provider. Make sure you discuss any questions you have with your health care provider. Document Released: 06/16/2009 Document Revised: 01/26/2016 Document Reviewed: 09/24/2014 Elsevier Interactive Patient Education  2018 [place] Maintenance, Female Adopting a healthy lifestyle and getting preventive care can go a long way to promote health and wellness. Talk with your health care provider about what schedule of regular examinations is right for you.  This is a good chance for you to check in with your provider about disease prevention and staying healthy. In between checkups, there are plenty of things you can do on your own. Experts have done a lot of research about which lifestyle changes and preventive measures are most likely to keep you healthy. Ask your health care provider for more information. Weight  and diet Eat a healthy diet  Be sure to include plenty of vegetables, fruits, low-fat dairy products, and lean protein.  Do not eat a lot of foods high in solid fats, added sugars, or salt.  Get regular exercise. This is one of the most important things you can do for your health. ? Most adults should exercise for at least 150 minutes each week. The exercise should increase your heart rate and make you sweat (moderate-intensity exercise). ? Most adults should also do strengthening exercises at least twice a week. This is in addition to the moderate-intensity exercise.  Maintain a healthy weight  Body mass index (BMI) is a measurement that can be used to identify possible weight problems. It estimates body fat based on height and weight. Your health care provider can help determine your BMI and help you achieve or maintain a healthy weight.  For females 68 years of age and older: ? A BMI below 18.5 is considered underweight. ? A BMI of 18.5 to 24.9 is normal. ? A BMI of 25 to 29.9 is considered overweight. ? A BMI of 30 and above is considered obese.  Watch levels of cholesterol and blood lipids  You should start having your blood tested for lipids and cholesterol at 68 years of age, then have this test every 5 years.  You may need to have your cholesterol levels checked more often if: ? Your lipid or cholesterol levels are high. ? You are older than 68 years of age. ? You are at high risk for heart disease.  Cancer screening Lung Cancer  Lung cancer screening is recommended for adults 77-39 years old who are at high risk  for lung cancer because of a history of smoking.  A yearly low-dose CT scan of the lungs is recommended for people who: ? Currently smoke. ? Have quit within the past 15 years. ? Have at least a 30-pack-year history of smoking. A pack year is smoking an average of one pack of cigarettes a day for 1 year.  Yearly screening should continue until it has been 15 years since you quit.  Yearly screening should stop if you develop a health problem that would prevent you from having lung cancer treatment.  Breast Cancer  Practice breast self-awareness. This means understanding how your breasts normally appear and feel.  It also means doing regular breast self-exams. Let your health care provider know about any changes, no matter how small.  If you are in your 20s or 30s, you should have a clinical breast exam (CBE) by a health care provider every 1-3 years as part of a regular health exam.  If you are 67 or older, have a CBE every year. Also consider having a breast X-ray (mammogram) every year.  If you have a family history of breast cancer, talk to your health care provider about genetic screening.  If you are at high risk for breast cancer, talk to your health care provider about having an MRI and a mammogram every year.  Breast cancer gene (BRCA) assessment is recommended for women who have family members with BRCA-related cancers. BRCA-related cancers include: ? Breast. ? Ovarian. ? Tubal. ? Peritoneal cancers.  Results of the assessment will determine the need for genetic counseling and BRCA1 and BRCA2 testing.  Cervical Cancer Your health care provider may recommend that you be screened regularly for cancer of the pelvic organs (ovaries, uterus, and vagina). This screening involves a pelvic examination, including checking for microscopic changes to  the surface of your cervix (Pap test). You may be encouraged to have this screening done every 3 years, beginning at age 26.  For women  ages 93-65, health care providers may recommend pelvic exams and Pap testing every 3 years, or they may recommend the Pap and pelvic exam, combined with testing for human papilloma virus (HPV), every 5 years. Some types of HPV increase your risk of cervical cancer. Testing for HPV may also be done on women of any age with unclear Pap test results.  Other health care providers may not recommend any screening for nonpregnant women who are considered low risk for pelvic cancer and who do not have symptoms. Ask your health care provider if a screening pelvic exam is right for you.  If you have had past treatment for cervical cancer or a condition that could lead to cancer, you need Pap tests and screening for cancer for at least 20 years after your treatment. If Pap tests have been discontinued, your risk factors (such as having a new sexual partner) need to be reassessed to determine if screening should resume. Some women have medical problems that increase the chance of getting cervical cancer. In these cases, your health care provider may recommend more frequent screening and Pap tests.  Colorectal Cancer  This type of cancer can be detected and often prevented.  Routine colorectal cancer screening usually begins at 68 years of age and continues through 68 years of age.  Your health care provider may recommend screening at an earlier age if you have risk factors for colon cancer.  Your health care provider may also recommend using home test kits to check for hidden blood in the stool.  A small camera at the end of a tube can be used to examine your colon directly (sigmoidoscopy or colonoscopy). This is done to check for the earliest forms of colorectal cancer.  Routine screening usually begins at age 44.  Direct examination of the colon should be repeated every 5-10 years through 68 years of age. However, you may need to be screened more often if early forms of precancerous polyps or small growths  are found.  Skin Cancer  Check your skin from head to toe regularly.  Tell your health care provider about any new moles or changes in moles, especially if there is a change in a mole's shape or color.  Also tell your health care provider if you have a mole that is larger than the size of a pencil eraser.  Always use sunscreen. Apply sunscreen liberally and repeatedly throughout the day.  Protect yourself by wearing long sleeves, pants, a wide-brimmed hat, and sunglasses whenever you are outside.  Heart disease, diabetes, and high blood pressure  High blood pressure causes heart disease and increases the risk of stroke. High blood pressure is more likely to develop in: ? People who have blood pressure in the high end of the normal range (130-139/85-89 mm Hg). ? People who are overweight or obese. ? People who are African American.  If you are 26-86 years of age, have your blood pressure checked every 3-5 years. If you are 35 years of age or older, have your blood pressure checked every year. You should have your blood pressure measured twice-once when you are at a hospital or clinic, and once when you are not at a hospital or clinic. Record the average of the two measurements. To check your blood pressure when you are not at a hospital or clinic, you  can use: ? An automated blood pressure machine at a pharmacy. ? A home blood pressure monitor.  If you are between 69 years and 57 years old, ask your health care provider if you should take aspirin to prevent strokes.  Have regular diabetes screenings. This involves taking a blood sample to check your fasting blood sugar level. ? If you are at a normal weight and have a low risk for diabetes, have this test once every three years after 68 years of age. ? If you are overweight and have a high risk for diabetes, consider being tested at a younger age or more often. Preventing infection Hepatitis B  If you have a higher risk for hepatitis  B, you should be screened for this virus. You are considered at high risk for hepatitis B if: ? You were born in a country where hepatitis B is common. Ask your health care provider which countries are considered high risk. ? Your parents were born in a high-risk country, and you have not been immunized against hepatitis B (hepatitis B vaccine). ? You have HIV or AIDS. ? You use needles to inject street drugs. ? You live with someone who has hepatitis B. ? You have had sex with someone who has hepatitis B. ? You get hemodialysis treatment. ? You take certain medicines for conditions, including cancer, organ transplantation, and autoimmune conditions.  Hepatitis C  Blood testing is recommended for: ? Everyone born from [birth year] through 1967. ? Anyone with known risk factors for hepatitis C.  Sexually transmitted infections (STIs)  You should be screened for sexually transmitted infections (STIs) including gonorrhea and chlamydia if: ? You are sexually active and are younger than 68 years of age. ? You are older than 68 years of age and your health care provider tells you that you are at risk for this type of infection. ? Your sexual activity has changed since you were last screened and you are at an increased risk for chlamydia or gonorrhea. Ask your health care provider if you are at risk.  If you do not have HIV, but are at risk, it may be recommended that you take a prescription medicine daily to prevent HIV infection. This is called pre-exposure prophylaxis (PrEP). You are considered at risk if: ? You are sexually active and do not regularly use condoms or know the HIV status of your partner(s). ? You take drugs by injection. ? You are sexually active with a partner who has HIV.  Talk with your health care provider about whether you are at high risk of being infected with HIV. If you choose to begin PrEP, you should first be tested for HIV. You should then be tested every 3 months for as  long as you are taking PrEP. Pregnancy  If you are premenopausal and you may become pregnant, ask your health care provider about preconception counseling.  If you may become pregnant, take 400 to 800 micrograms (mcg) of folic acid every day.  If you want to prevent pregnancy, talk to your health care provider about birth control (contraception). Osteoporosis and menopause  Osteoporosis is a disease in which the bones lose minerals and strength with aging. This can result in serious bone fractures. Your risk for osteoporosis can be identified using a bone density scan.  If you are 44 years of age or older, or if you are at risk for osteoporosis and fractures, ask your health care provider if you should be screened.  Ask your health  care provider whether you should take a calcium or vitamin D supplement to lower your risk for osteoporosis.  Menopause may have certain physical symptoms and risks.  Hormone replacement therapy may reduce some of these symptoms and risks. Talk to your health care provider about whether hormone replacement therapy is right for you. Follow these instructions at home:  Schedule regular health, dental, and eye exams.  Stay current with your immunizations.  Do not use any tobacco products including cigarettes, chewing tobacco, or electronic cigarettes.  If you are pregnant, do not drink alcohol.  If you are breastfeeding, limit how much and how often you drink alcohol.  Limit alcohol intake to no more than 1 drink per day for nonpregnant women. One drink equals 12 ounces of beer, 5 ounces of wine, or 1 ounces of hard liquor.  Do not use street drugs.  Do not share needles.  Ask your health care provider for help if you need support or information about quitting drugs.  Tell your health care provider if you often feel depressed.  Tell your health care provider if you have ever been abused or do not feel safe at home. This information is not intended  to replace advice given to you by your health care provider. Make sure you discuss any questions you have with your health care provider. Document Released: 03/05/2011 Document Revised: 01/26/2016 Document Reviewed: 05/24/2015 Elsevier Interactive Patient Education  [employer].

## 2017-04-03 ENCOUNTER — Encounter: Payer: Self-pay | Admitting: Acute Care

## 2017-04-03 ENCOUNTER — Ambulatory Visit
Admission: RE | Admit: 2017-04-03 | Discharge: 2017-04-03 | Disposition: A | Discharge status: Home / Self Care | Payer: PPO | Source: Ambulatory Visit | Attending: Acute Care | Admitting: Acute Care

## 2017-04-03 ENCOUNTER — Ambulatory Visit (INDEPENDENT_AMBULATORY_CARE_PROVIDER_SITE_OTHER): Payer: PPO | Admitting: Acute Care

## 2017-04-03 DIAGNOSIS — Z87891 Personal history of nicotine dependence: Secondary | ICD-10-CM

## 2017-04-03 NOTE — Progress Notes (Signed)
Shared Decision Making Visit Lung Cancer Screening Program [phone])   Eligibility:  Age 66 y.o.  Pack Years Smoking History Calculation 66-pack-year smoking history (# packs/per year x # years smoked)  Recent History of coughing up blood  no  Unexplained weight loss? no ( >Than 15 pounds within the last 6 months )  Prior History Lung / other cancer no (Diagnosis within the last 5 years already requiring surveillance chest CT Scans).  Smoking Status Former Smoker  Former Smokers: Years since quit: 11 years  Quit Date: 2007  Visit Components:  Discussion included one or more decision making aids. yes  Discussion included risk/benefits of screening. yes  Discussion included potential follow up diagnostic testing for abnormal scans. yes  Discussion included meaning and risk of over diagnosis. yes  Discussion included meaning and risk of False Positives. yes  Discussion included meaning of total radiation exposure. yes  Counseling Included:  Importance of adherence to annual lung cancer LDCT screening. yes  Impact of comorbidities on ability to participate in the program. yes  Ability and willingness to under diagnostic treatment. yes  Smoking Cessation Counseling:  Current Smokers:   Discussed importance of smoking cessation. Not applicable former smoker  Information about tobacco cessation classes and interventions provided to patient. yes  Patient provided with "ticket" for LDCT Scan. yes  Symptomatic Patient. no  Counseling  Diagnosis Code: Tobacco Use Z72.0  Asymptomatic Patient yes  Counseling (Intermediate counseling: > three minutes counseling) [id number]  Former Smokers:   Discussed the importance of maintaining cigarette abstinence. yes  Diagnosis Code: Personal History of Nicotine Dependence. P38.250  Information about tobacco cessation classes and interventions provided to patient. Yes  Patient provided with "ticket" for LDCT Scan.  yes  Written Order for Lung Cancer Screening with LDCT placed in Epic. Yes (CT Chest Lung Cancer Screening Low Dose W/O CM) [id number] Z12.2-Screening of respiratory organs Z87.891-Personal history of nicotine dependence  I spent 25 minutes of face to face time with Katherine Foley discussing the risks and benefits of lung cancer screening. We viewed a power point together that explained in detail the above noted topics. We took the time to pause the power point at intervals to allow for questions to be asked and answered to ensure understanding. We discussed that she had taken the single most powerful action possible to decrease her risk of developing lung cancer when she quit smoking. I counseled her to remain smoke free, and to contact me if she ever had the desire to smoke again so that I can provide resources and tools to help support the effort to remain smoke free. We discussed the time and location of the scan, and that either  Katherine Glassman RN or I will call with the results within  24-48 hours of receiving them. Katherine Foley has my card and contact information in the event she needs to speak with me, in addition to a copy of the power point we reviewed as a resource. Katherine Foley verbalized understanding of all of the above and had no further questions upon leaving the office.     I explained to the patient that there has been a high incidence of coronary artery disease noted on these exams. I explained that this is a non-gated exam therefore degree or severity cannot be determined. This patient is currently on statin therapy and is followed by cardiology. I have asked the patient to follow-up with their PCP regarding any incidental finding of coronary artery disease and management with  diet or medication as they feel is clinically indicated. The patient verbalized understanding of the above and had no further questions.    Magdalen Spatz, NP 04/03/2017

## 2017-04-05 ENCOUNTER — Other Ambulatory Visit: Payer: Self-pay | Admitting: Family Medicine

## 2017-04-12 NOTE — Progress Notes (Signed)
Pt returned call. Informed her of the results and recs per SG. Pt verbalized understanding and denied any further questions or concerns at this time.

## 2017-04-15 ENCOUNTER — Other Ambulatory Visit: Payer: Self-pay | Admitting: Acute Care

## 2017-04-15 DIAGNOSIS — Z122 Encounter for screening for malignant neoplasm of respiratory organs: Secondary | ICD-10-CM

## 2017-04-15 DIAGNOSIS — Z87891 Personal history of nicotine dependence: Secondary | ICD-10-CM

## 2017-04-30 ENCOUNTER — Encounter: Payer: Self-pay | Admitting: *Deleted

## 2017-05-08 ENCOUNTER — Other Ambulatory Visit (INDEPENDENT_AMBULATORY_CARE_PROVIDER_SITE_OTHER): Payer: PPO

## 2017-05-08 DIAGNOSIS — E785 Hyperlipidemia, unspecified: Secondary | ICD-10-CM

## 2017-05-08 LAB — LDL CHOLESTEROL, DIRECT: LDL DIRECT: 73 mg/dL

## 2017-05-08 MED ORDER — ROSUVASTATIN CALCIUM 20 MG PO TABS: 20.0000 mg | ORAL_TABLET | Freq: Every day | ORAL | 0 refills | Status: DC

## 2017-05-20 ENCOUNTER — Ambulatory Visit (INDEPENDENT_AMBULATORY_CARE_PROVIDER_SITE_OTHER): Payer: PPO | Admitting: Cardiovascular Disease

## 2017-05-20 ENCOUNTER — Encounter: Payer: Self-pay | Admitting: Cardiovascular Disease

## 2017-05-20 ENCOUNTER — Other Ambulatory Visit: Payer: Self-pay | Admitting: Family Medicine

## 2017-05-20 VITALS — BP 140/80 | HR 64 | Ht 63.0 in | Wt 155.4 lb

## 2017-05-20 DIAGNOSIS — I350 Nonrheumatic aortic (valve) stenosis: Secondary | ICD-10-CM

## 2017-05-20 DIAGNOSIS — I10 Essential (primary) hypertension: Secondary | ICD-10-CM | POA: Diagnosis not present

## 2017-05-20 DIAGNOSIS — Z952 Presence of prosthetic heart valve: Secondary | ICD-10-CM

## 2017-05-20 DIAGNOSIS — I25118 Atherosclerotic heart disease of native coronary artery with other forms of angina pectoris: Secondary | ICD-10-CM | POA: Diagnosis not present

## 2017-05-20 NOTE — Progress Notes (Signed)
Cardiology Office Note Date:  05/21/2017   ID:  Zierra, Laroque [birth year], MRN [id number]  PCP:  Marin Olp, MD  Cardiologist:  Sherren Mocha, MD    Chief Complaint  Patient presents with  . Follow-up    bruit of right carotid artery/aortic valve disorders     History of Present Illness: FAYLENE ALLERTON is a 66 y.o. female who presents for Follow-up of coronary artery disease and aortic valve disease. The patient has a history of non-STEMI when she was treated with PCI of the right coronary artery in 2011 and PCI the left circumflex in 2012. She later developed progressive aortic stenosis ultimately requiring aortic valve replacement with a 21 mm magna ease pericardial valve and LIMA to LAD grafting in 2012.  The patient is here alone today. She is doing fairly well. She continues to have some episodes of burning in her neck and chest. She takes this is related to acid reflux. Symptoms occur at rest. She has not had any exertional chest pain or pressure. She denies shortness of breath, leg swelling, or heart palpitations. States that she has become "lazy" with retirement. She is not doing any regular exercise.  Past Medical History:  Diagnosis Date  . Adenomatous colon polyp   . ANXIETY DEPRESSION 02/15/2009  . AORTIC STENOSIS 07/07/2010   a. echo 12/23/10: EF 60-65%, mod to severe AS with mean gradient 29 mmHg;  b. 04/2011 s/p bioprosthetic AVR.  Marland Kitchen Barrett's esophagus 04/14/2007  . Barrett's esophagus 2000  . CAD, NATIVE VESSEL 05/17/2010   a. NSTEMI 8/11 - BMS to RCA;  b. cath 4/12: dLAD 50%, RI 80% (PCI), AVCFX 30%, pRCA 30%, stent ok, 40-50, then 50% dist RCA;  c. s/p Promus DES to RI 12/25/10;  d. 04/2011 CABG x 1 (LIMA->LAD) @ time of AVR  . Carotid stenosis 05/17/2010   a. dopplers 10/25: RICA 85-27%; LICA 7-82% (follow up due 11/12)  . Chronic bronchitis ([place])    "multiple times" (03/25/2013)  . 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 Exertional shortness  of breath    "related to COPD problems" (03/25/2013)  . GERD (gastroesophageal reflux disease)   . H/O hiatal hernia   . Hemorrhoid thrombosis    "had it lanced" (03/25/2013)  . Herpes simplex without mention of complication 12/25/5359  . History of blood transfusion    'w/OHS" (03/25/2013)  . Amanda, MILD 08/29/2009  . HYPERTENSION 04/14/2007  . MIGRAINE HEADACHE 04/20/2008  . Myocardial infarction [facility]) 2011   "during catherization" (03/25/2013)  . OSTEOPENIA 04/14/2007  . Ovarian cyst   . Pneumonia    "multiple times" (03/25/2013)  . URINARY INCONTINENCE, STRESS, MILD 08/26/2007  . Urine, incontinence, stress female     Past Surgical History:  Procedure Laterality Date  . AORTIC VALVE REPLACEMENT  2012  . APPENDECTOMY    . BLEPHAROPLASTY Bilateral ~ 1998  . CARDIAC CATHETERIZATION N/A 07/06/2016   Procedure: Left Heart Cath and Cors/Grafts Angiography;  Surgeon: Sherren Mocha, MD;  Location: [place] [facility];  Service: Cardiovascular;  Laterality: N/A;  . CHOLECYSTECTOMY N/A 03/25/2013   Procedure: LAPAROSCOPIC CHOLECYSTECTOMY WITH INTRAOPERATIVE CHOLANGIOGRAM;  Surgeon: Imogene Burn. Georgette Dover, MD;  Location: [place];  Service: General;  Laterality: N/A;  . CORONARY ANGIOPLASTY WITH STENT PLACEMENT  2010   "I have 2 stents" (03/25/2013)  . NASAL SINUS SURGERY  1997; 1998   "cust in maxillary sinus; put a stent in then removed it" (03/25/2013)  . OVARIAN CYST REMOVAL  Right 2011   "size of a soccer ball" (03/25/2013)  . thrombosed hemorrohid lanced    . TONSILLECTOMY AND ADENOIDECTOMY    . TOTAL ABDOMINAL HYSTERECTOMY      Current Outpatient Prescriptions  Medication Sig Dispense Refill  . albuterol (PROVENTIL HFA;VENTOLIN HFA) 108 (90 Base) MCG/ACT inhaler Inhale 2 puffs into the lungs every 6 (six) hours as needed for wheezing or shortness of breath.    Marland Kitchen aspirin 81 MG tablet Take 81 mg by mouth daily.      . fluticasone (FLONASE) 50 MCG/ACT nasal spray Place 2 sprays into both  nostrils daily. 16 g 1  . fluticasone (FLONASE) 50 MCG/ACT nasal spray Place 2 sprays into both nostrils daily as needed for allergies or rhinitis.    Marland Kitchen gabapentin (NEURONTIN) 100 MG capsule Take 100 mg by mouth daily.    Marland Kitchen gabapentin (NEURONTIN) 100 MG capsule TAKE ONE CAPSULE BY MOUTH AT BEDTIME 90 capsule 1  . Multiple Vitamins-Minerals (MULTIVITAMIN ADULT PO) Take 1 tablet by mouth daily.     . nitroGLYCERIN (NITROSTAT) 0.4 MG SL tablet Place 0.4 mg under the tongue every 5 (five) minutes as needed for chest pain.    Marland Kitchen omeprazole (PRILOSEC) 40 MG capsule TAKE ONE CAPSULE BY MOUTH ONCE DAILY 90 capsule 3  . rosuvastatin (CRESTOR) 20 MG tablet Take 1 tablet (20 mg total) by mouth daily. 90 tablet 0  . sertraline (ZOLOFT) 50 MG tablet TAKE ONE TABLET BY MOUTH ONCE DAILY 30 tablet 2  . SYMBICORT 160-4.5 MCG/ACT inhaler INHALE TWO PUFFS BY MOUTH TWICE DAILY 10.2 g 11  . desvenlafaxine (PRISTIQ) 100 MG 24 hr tablet TAKE 1 TABLET BY MOUTH ONCE DAILY 30 tablet 5  . SPIRIVA HANDIHALER 18 MCG inhalation capsule INHALE 1 PUFF BY MOUTH ONCE DAILY 30 capsule 3   No current facility-administered medications for this visit.     Allergies:   Codeine sulfate and Hydrocodone-acetaminophen   Social History:  The patient  reports that she quit smoking about 11 years ago. Her smoking use included Cigarettes. She has a 66.00 pack-year smoking history. She has never used smokeless tobacco. She reports that she drinks alcohol. She reports that she does not use drugs.   Family History:  The patient's family history includes Asthma in her son; Breast cancer in her daughter and daughter; COPD in her father; Coronary artery disease in her other; Heart disease in her father, maternal uncle, mother, and other; Rheum arthritis in her daughter and father.  ROS:  Please see the history of present illness.  All other systems are reviewed and negative.   PHYSICAL EXAM: VS:  BP 140/80   Pulse 64   Ht 5\' 3"  (1.6 m)   Wt  70.5 kg (155 lb 6.4 oz)   BMI 27.53 kg/m  , BMI Body mass index is 27.53 kg/m. GEN: Well nourished, well developed, in no acute distress  HEENT: normal  Neck: no JVD, no masses. bilateral carotid bruits Cardiac: RRR with 2/6 systolic murmur at the RUSB, no diastolic murmur               Respiratory:  clear to auscultation bilaterally, normal work of breathing GI: soft, nontender, nondistended, + BS MS: no deformity or atrophy  Ext: no pretibial edema, pedal pulses 2+= bilaterally Skin: warm and dry, no rash Neuro:  Strength and sensation are intact Psych: euthymic mood, full affect  EKG:  EKG is ordered today. The ekg ordered today shows normal sinus rhythm 64 bpm,  nonspecific T wave abnormality.  Recent Labs: 02/01/2017: TSH 0.55 03/20/2017: ALT 36; BUN 13; Creatinine, Ser 0.65; Hemoglobin 12.2; Platelets 234.0; Potassium 4.1; Sodium 137   Lipid Panel     Component Value Date/Time   CHOL 194 03/20/2017 0822   TRIG 138.0 03/20/2017 0822   HDL 73.70 03/20/2017 0822   CHOLHDL 3 03/20/2017 0822   VLDL 27.6 03/20/2017 0822   LDLCALC 92 03/20/2017 0822   LDLDIRECT 73.0 05/08/2017 0738      Wt Readings from Last 3 Encounters:  05/20/17 70.5 kg (155 lb 6.4 oz)  03/29/17 69.7 kg (153 lb 9.6 oz)  03/15/17 69.8 kg (153 lb 12.8 oz)     Cardiac Studies Reviewed: Cardiac catheterization 07/06/2016: Conclusion   1. Mildly dilated aorta with no angiographic evidence of bioprosthetic aortic valve insufficiency 2. Widely patent coronary arteries with patent stents in the ramus intermedius and RCA 3. Atretic LIMA-LAD graft because of normal native LAD flow  Suspect noncardiac symptoms. Would be reasonable to follow with annual echo studies since the mean gradient across the aortic bioprosthesis is mildly elevated compared with her previous echo   Echocardiogram 12/26/2016: Study Conclusions  - Left ventricle: The cavity size was normal. Wall thickness was   normal. Systolic  function was vigorous. The estimated ejection   fraction was in the range of 65% to 70%. Wall motion was normal;   there were no regional wall motion abnormalities. Doppler   parameters are consistent with abnormal left ventricular   relaxation (grade 1 diastolic dysfunction). - Aortic valve: Bioprosthetic aortic valve is well seated. Peak   velocity (S): 244 cm/s. Mean gradient (S): 11 mm Hg. - Tricuspid valve: There was trivial regurgitation.  Impressions:  - Compared to the prior study, there has been no significant   interval change.  ASSESSMENT AND PLAN: 1.  CAD, native vessel: Atypical angina. Most recent cardiac catheterization study reviewed with no obstructive disease to any major vascular territory. Will continue current medications. Suspect gastroesophageal reflux disease is playing a significant role in her symptoms. Advised regular exercise. Discussed lifestyle modification at length today. Medications reviewed and include antiplatelet therapy with aspirin and a statin drug  2. Aortic valve disorder s/p AVR: last echo reviewed with normal function of the aortic bioprosthesis (mean gradient 11 mmHg, no paravalvular regurgitation)  3. Hyperlipidemia: Most recent lipids reviewed. Continue Crestor 20 mg daily. Lifestyle modification discussed.  4. Carotid bruits: The patient has external carotid stenosis. Her most recent carotid ultrasound is reviewed with no significant ICA stenosis. She quit smoking 11 years ago. She is on antiplatelet therapy and a statin drug.  Current medicines are reviewed with the patient today.  The patient does not have concerns regarding medicines.  Labs/ tests ordered today include:   Orders Placed This Encounter  Procedures  . EKG 12-Lead   Disposition:   FU one year  Signed, Sherren Mocha, MD  05/21/2017 2:24 PM    [address], [place], [place]  [address] Phone: [phone]; Fax: [phone]

## 2017-05-20 NOTE — Patient Instructions (Signed)

## 2017-06-17 ENCOUNTER — Other Ambulatory Visit: Payer: Self-pay | Admitting: Family Medicine

## 2017-06-18 ENCOUNTER — Telehealth: Payer: Self-pay

## 2017-06-18 DIAGNOSIS — E785 Hyperlipidemia, unspecified: Secondary | ICD-10-CM

## 2017-06-18 NOTE — Telephone Encounter (Signed)
Pt coming for repeat labs 06/19/17. Need future order placed. Thank you.

## 2017-06-18 NOTE — Telephone Encounter (Signed)
Order was entered 05/08/17.

## 2017-06-19 ENCOUNTER — Other Ambulatory Visit (INDEPENDENT_AMBULATORY_CARE_PROVIDER_SITE_OTHER): Payer: PPO

## 2017-06-19 DIAGNOSIS — E785 Hyperlipidemia, unspecified: Secondary | ICD-10-CM | POA: Diagnosis not present

## 2017-06-19 LAB — LDL CHOLESTEROL, DIRECT: LDL DIRECT: 65 mg/dL

## 2017-06-19 NOTE — Telephone Encounter (Signed)
The order on 05/08/17 was not placed as future. I will re-order lab as future so it can be released when pt comes for lab visit.

## 2017-06-22 DIAGNOSIS — S0501XA Injury of conjunctiva and corneal abrasion without foreign body, right eye, initial encounter: Secondary | ICD-10-CM | POA: Diagnosis not present

## 2017-07-15 ENCOUNTER — Ambulatory Visit (INDEPENDENT_AMBULATORY_CARE_PROVIDER_SITE_OTHER): Payer: PPO | Admitting: Family Medicine

## 2017-07-15 ENCOUNTER — Encounter: Payer: Self-pay | Admitting: Family Medicine

## 2017-07-15 VITALS — BP 150/90 | HR 64 | Ht 63.0 in | Wt 160.2 lb

## 2017-07-15 DIAGNOSIS — R319 Hematuria, unspecified: Secondary | ICD-10-CM

## 2017-07-15 LAB — POC URINALSYSI DIPSTICK (AUTOMATED)
Bilirubin, UA: NEGATIVE
Blood, UA: NEGATIVE
GLUCOSE UA: NEGATIVE
KETONES UA: NEGATIVE
Nitrite, UA: NEGATIVE
Protein, UA: NEGATIVE
SPEC GRAV UA: 1.015 (ref 1.010–1.025)
Urobilinogen, UA: 0.2 E.U./dL
pH, UA: 6 (ref 5.0–8.0)

## 2017-07-15 MED ORDER — CEPHALEXIN 500 MG PO CAPS: 500.0000 mg | ORAL_CAPSULE | Freq: Two times a day (BID) | ORAL | 0 refills | Status: AC

## 2017-07-15 NOTE — Progress Notes (Signed)
    Subjective:  Katherine Foley is a 66 y.o. female who presents today with a chief complaint of hematuria.   HPI:  Hematuria, Acute issue Symptoms started about a month ago. Noticed streaks of blood in her urine.  Yesterday her symptoms worsened and she noticed her urine was red colored.  She has also had occasional dysuria and increased frequency.  No treatments tried.  No history of UTI in the past.  She has had some backache.  Some nausea.  No fevers.   ROS: Per HPI  PMH: Smoking history reviewed. Former smoker.   Objective:  Physical Exam: BP (!) 150/90   Pulse 64   Ht 5\' 3"  (1.6 m)   Wt 160 lb 3.2 oz (72.7 kg)   SpO2 99%   BMI 28.38 kg/m   Gen: NAD, resting comfortably CV: RRR with no murmurs appreciated Pulm: NWOB, CTAB with no crackles, wheezes, or rhonchi GI: Normal bowel sounds present. Soft, Nontender, Nondistended. MSK:  -Back: No CVA tenderness Skin: Warm, dry Neuro: Grossly normal, moves all extremities Psych: Normal affect and thought content  Results for orders placed or performed in visit on 07/15/17 (from the past 72 hour(s))  POCT Urinalysis Dipstick (Automated)     Status: Abnormal   Collection Time: 07/15/17  3:40 PM  Result Value Ref Range   Color, UA Yellow    Clarity, UA Hazy    Glucose, UA Negative    Bilirubin, UA Negative    Ketones, UA Negative    Spec Grav, UA 1.015 1.010 - 1.025   Blood, UA Negative    pH, UA 6.0 5.0 - 8.0   Protein, UA Negative    Urobilinogen, UA 0.2 0.2 or 1.0 E.U./dL   Nitrite, UA Negative    Leukocytes, UA Moderate (2+) (A) Negative     Assessment/Plan:  Urinary tract infection Symptoms and UA consistent with UTI.  UA without blood.  Treat with 1 week course of Keflex.  Sent for urine culture.  Discussed return precautions including development of fever, chills, rigors, severe pain, or failure symptoms to improve after starting antibiotic.  Algis Greenhouse. [name], MD 07/15/2017 3:52 PM

## 2017-07-18 ENCOUNTER — Other Ambulatory Visit: Payer: Self-pay | Admitting: Family Medicine

## 2017-07-18 ENCOUNTER — Telehealth: Payer: Self-pay | Admitting: Family Medicine

## 2017-07-18 LAB — URINE CULTURE
MICRO NUMBER: 81271726
SPECIMEN QUALITY:: ADEQUATE

## 2017-07-18 MED ORDER — NITROFURANTOIN MONOHYD MACRO 100 MG PO CAPS: 100.0000 mg | ORAL_CAPSULE | Freq: Two times a day (BID) | ORAL | 0 refills | Status: DC

## 2017-07-18 NOTE — Telephone Encounter (Signed)
Patient want's to know why nitrofurantoin, macrocrystal-monohydrate, (MACROBID) 100 MG capsule was sent to the pharmacy?

## 2017-07-19 NOTE — Telephone Encounter (Signed)
Pt is aware of urine culture results.

## 2017-08-07 ENCOUNTER — Other Ambulatory Visit: Payer: Self-pay | Admitting: Family Medicine

## 2017-08-31 ENCOUNTER — Other Ambulatory Visit: Payer: Self-pay | Admitting: Family Medicine

## 2017-09-06 ENCOUNTER — Ambulatory Visit (INDEPENDENT_AMBULATORY_CARE_PROVIDER_SITE_OTHER): Payer: PPO | Admitting: Family Medicine

## 2017-09-06 ENCOUNTER — Other Ambulatory Visit: Payer: Self-pay | Admitting: Family Medicine

## 2017-09-06 ENCOUNTER — Encounter: Payer: Self-pay | Admitting: Family Medicine

## 2017-09-06 VITALS — BP 150/90 | HR 66 | Temp 98.4°F | Ht 63.0 in | Wt 161.2 lb

## 2017-09-06 DIAGNOSIS — I10 Essential (primary) hypertension: Secondary | ICD-10-CM | POA: Diagnosis not present

## 2017-09-06 DIAGNOSIS — Z8719 Personal history of other diseases of the digestive system: Secondary | ICD-10-CM

## 2017-09-06 DIAGNOSIS — K219 Gastro-esophageal reflux disease without esophagitis: Secondary | ICD-10-CM

## 2017-09-06 MED ORDER — PROPRANOLOL HCL 20 MG PO TABS: 20.0000 mg | ORAL_TABLET | Freq: Two times a day (BID) | ORAL | 5 refills | Status: DC

## 2017-09-06 MED ORDER — DEXLANSOPRAZOLE 60 MG PO CPDR: 60.0000 mg | DELAYED_RELEASE_CAPSULE | Freq: Every day | ORAL | 2 refills | Status: DC

## 2017-09-06 NOTE — Patient Instructions (Signed)
Stop omeprazole  Start dexilant. This is VERY strong- can only take tums along with this. Cannot take the omeprazole  We will call you within a week or two about your referral to GI. If you do not hear within 3 weeks, give [place] a call.   If new or worsening symptoms despite this treatment return to see [place] before GI visit- suspect you should improve within about 2 weeks

## 2017-09-06 NOTE — Assessment & Plan Note (Addendum)
S: 2-3 weeks burning in upper chest at night when lays down- helps when props up more at night. Helps when uses omeprazole BID instead of daily. Tums also helps. She has gained some weight recently with increased stress- loss of sister in December who was best friend at 84. 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 No exertional chest pain or increase in baseline shortness of breath. Stop omeprazole, Start dexilant, can use prn tums. Refer to GI as likely needs endoscopy (history barrett's esophagus- last evaluation 2015). Urgent return precautions discussed

## 2017-09-06 NOTE — Progress Notes (Signed)
Subjective:  Katherine Foley is a 67 y.o. year old very pleasant female patient who presents for/with See problem oriented charting ROS- no fever or chills. Sometimes gets into coughing spells at night when throat irritated and can have some spit up with that. No exertional chest pain.    Past Medical History-  Patient Active Problem List   Diagnosis Date Noted  . 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 Depression 09/22/2015    Priority: High  . Former smoker 09/22/2015    Priority: High  . S/P AVR (aortic valve replacement) 11/06/2013    Priority: High  . Aortic stenosis 07/07/2010    Priority: High  . CAD, NATIVE VESSEL 05/17/2010    Priority: High  . COPD GOLD II  07/23/2007    Priority: High  . Hot flashes 03/22/2016    Priority: Medium  . Diastolic dysfunction 35/00/9381    Priority: Medium  . GERD (gastroesophageal reflux disease) 06/04/2014    Priority: Medium  . Carotid stenosis 05/17/2010    Priority: Medium  . HLD (hyperlipidemia) 08/29/2009    Priority: Medium  . Essential hypertension 04/14/2007    Priority: Medium  . Osteopenia 04/14/2007    Priority: Medium  . Allergic rhinitis 09/22/2015    Priority: Low  . Adenomatous colon polyp     Priority: Low  . Herpes simplex virus (HSV) infection 01/16/2010    Priority: Low    Medications- reviewed and updated Current Outpatient Medications  Medication Sig Dispense Refill  . albuterol (PROVENTIL HFA;VENTOLIN HFA) 108 (90 Base) MCG/ACT inhaler Inhale 2 puffs into the lungs every 6 (six) hours as needed for wheezing or shortness of breath.    Marland Kitchen aspirin 81 MG tablet Take 81 mg by mouth daily.      Marland Kitchen desvenlafaxine (PRISTIQ) 100 MG 24 hr tablet TAKE 1 TABLET BY MOUTH ONCE DAILY 30 tablet 5  . fluticasone (FLONASE) 50 MCG/ACT nasal spray Place 2 sprays into both nostrils daily. 16 g 1  . gabapentin (NEURONTIN) 100 MG capsule Take 100 mg by mouth daily.    . Multiple Vitamins-Minerals (MULTIVITAMIN  ADULT PO) Take 1 tablet by mouth daily.     . nitrofurantoin, macrocrystal-monohydrate, (MACROBID) 100 MG capsule Take 1 capsule (100 mg total) 2 (two) times daily by mouth. 10 capsule 0  . nitroGLYCERIN (NITROSTAT) 0.4 MG SL tablet Place 0.4 mg under the tongue every 5 (five) minutes as needed for chest pain.    Marland Kitchen omeprazole (PRILOSEC) 40 MG capsule TAKE ONE CAPSULE BY MOUTH ONCE DAILY 90 capsule 3  . rosuvastatin (CRESTOR) 20 MG tablet TAKE 1 TABLET BY MOUTH DAILY 90 tablet 3  . sertraline (ZOLOFT) 50 MG tablet TAKE 1 TABLET BY MOUTH ONCE DAILY 30 tablet 2  . SPIRIVA HANDIHALER 18 MCG inhalation capsule INHALE 1 PUFF BY MOUTH ONCE DAILY 30 capsule 3  . SYMBICORT 160-4.5 MCG/ACT inhaler INHALE TWO PUFFS BY MOUTH TWICE DAILY 10.2 g 11     Objective: BP (!) 150/90 (BP Location: Left Arm, Patient Position: Sitting, Cuff Size: Large)   Pulse 66   Temp 98.4 F (36.9 C) (Oral)   Ht 5\' 3"  (1.6 m)   Wt 161 lb 3.2 oz (73.1 kg)   SpO2 96%   BMI 28.56 kg/m  Gen: NAD, resting comfortably Oropharynx normal CV: RRR no murmurs rubs or gallops Lungs: CTAB no crackles, wheeze, rhonchi Abdomen: soft/nontender/nondistended/normal bowel sounds. No rebound or guarding.  Ext: no edema Skin: warm, dry  Bp high on repeat still.   Assessment/Plan:  Essential hypertension S: controlled poorly on propranolol 20mg  BID- on last 2 visits. Has not monitored at home.  BP Readings from Last 3 Encounters:  09/06/17 (!) 150/90  07/15/17 (!) 150/90  05/20/17 140/80  A/P: We discussed blood pressure goal of <140/90. Continue current meds:  1 month follow up- home monitoring and see sooner if high.   GERD (gastroesophageal reflux disease) S: 2-3 weeks burning in upper chest at night when lays down- helps when props up more at night. Helps when uses omeprazole BID instead of daily. Tums also helps. She has gained some weight recently with increased stress- loss of sister in December who was best friend at 49.  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 No exertional chest pain or increase in baseline shortness of breath. Stop omeprazole, Start dexilant, can use prn tums. Refer to GI as likely needs endoscopy. Urgent return precautions discussed   Future Appointments  Date Time Provider [place]  10/07/2017  3:30 PM Marin Olp, MD [facility]  1 month follow up  Orders Placed This Encounter  Procedures  . Ambulatory referral to Gastroenterology    Referral Priority:   Routine    Referral Type:   Consultation    Referral Reason:   Specialty Services Required    Number of Visits Requested:   1    Meds ordered this encounter  Medications  . dexlansoprazole (DEXILANT) 60 MG capsule    Sig: Take 1 capsule (60 mg total) by mouth daily.    Dispense:  30 capsule    Refill:  2  . propranolol (INDERAL) 20 MG tablet    Sig: Take 1 tablet (20 mg total) by mouth 2 (two) times daily.    Dispense:  60 tablet    Refill:  5    Return precautions advised.  Garret Reddish, MD

## 2017-09-06 NOTE — Assessment & Plan Note (Signed)
S: controlled poorly on propranolol 20mg  BID- on last 2 visits. Has not monitored at home.  BP Readings from Last 3 Encounters:  09/06/17 (!) 150/90  07/15/17 (!) 150/90  05/20/17 140/80  A/P: We discussed blood pressure goal of <140/90. Continue current meds:  1 month follow up- home monitoring and see sooner if high.

## 2017-09-16 ENCOUNTER — Other Ambulatory Visit: Payer: Self-pay | Admitting: Family Medicine

## 2017-09-26 ENCOUNTER — Other Ambulatory Visit: Payer: Self-pay | Admitting: Family Medicine

## 2017-10-07 ENCOUNTER — Encounter: Payer: Self-pay | Admitting: Family Medicine

## 2017-10-07 ENCOUNTER — Encounter: Payer: Self-pay | Admitting: Gastroenterology

## 2017-10-07 ENCOUNTER — Ambulatory Visit: Payer: PPO | Admitting: Gastroenterology

## 2017-10-07 ENCOUNTER — Ambulatory Visit (INDEPENDENT_AMBULATORY_CARE_PROVIDER_SITE_OTHER): Payer: PPO | Admitting: Family Medicine

## 2017-10-07 VITALS — BP 150/80 | HR 66 | Temp 97.5°F | Ht 62.0 in | Wt 162.4 lb

## 2017-10-07 VITALS — BP 130/80 | HR 64 | Ht 62.0 in | Wt 162.0 lb

## 2017-10-07 DIAGNOSIS — K219 Gastro-esophageal reflux disease without esophagitis: Secondary | ICD-10-CM

## 2017-10-07 DIAGNOSIS — R1314 Dysphagia, pharyngoesophageal phase: Secondary | ICD-10-CM | POA: Diagnosis not present

## 2017-10-07 DIAGNOSIS — J3489 Other specified disorders of nose and nasal sinuses: Secondary | ICD-10-CM

## 2017-10-07 DIAGNOSIS — I10 Essential (primary) hypertension: Secondary | ICD-10-CM

## 2017-10-07 MED ORDER — HYDROCHLOROTHIAZIDE 12.5 MG PO TABS: 12.5000 mg | ORAL_TABLET | Freq: Every day | ORAL | 5 refills | Status: DC

## 2017-10-07 NOTE — Patient Instructions (Signed)
Continue propranolol 20mg  twice a day  Add hydrochlorothiazide 12.5mg  to this regimen  Continue to check at home. If regularly over 140/90 see [place] back within the next month- if below this ok to recheck with me in 8-10 weeks

## 2017-10-07 NOTE — Progress Notes (Addendum)
[facility] Consult Note:  History: Katherine Foley 10/07/2017  Referring physician: Marin Olp, MD  Reason for consult/chief complaint: Gastroesophageal Reflux (regurgitaion at night when sleeping, always feels like something in the back of throat) and Emesis (intermittent)   Subjective  HPI:  She was last seen in late 2015 complaining of GERD and dysphagia.  She also had a history of colon polyp. EGD November 2015 revealed mild antral erythema, biopsy negative for H. pylori.  No stricture was identified, a wire-guided dilation was empirically performed.  No polyps seen on colonoscopy November 2015.  [name] is bothered by recurrence of reflux symptoms over the last 6-8 weeks characterized by early satiety, bloating (that might occur with even just water) regurgitation and occasional vomiting.  She has nocturnal GERD symptoms that are most bothersome with regurgitation so bad it might come out her nose.  She wondered if there might be some element of stress since she lost her sister to complications of liver disease about 6 weeks ago. In addition, there is some dysphagia where bread products might feel stuck in the neck.  For many years she has tended toward constipation with bloating and does not take any particular therapy for that. There is some improvement in the heartburn with a recent change from omeprazole to [place].  Because she has had this problem for many years, she reports being well aware of diet and lifestyle measures that are necessary to control symptoms.  She does not eat within several hours going to bed, and has a foam bed wedge.  She admits that the bed which is uncomfortable and she does not always use it, so I suggested she reposition it to under the mattress.  She stopped smoking many years ago and does not drink alcohol.  ROS:  Review of Systems  Constitutional: Negative for appetite change and unexpected weight change.  HENT: Negative for  mouth sores and voice change.   Eyes: Negative for pain and redness.  Respiratory: Positive for shortness of breath. Negative for cough.   Cardiovascular: Negative for chest pain and palpitations.  Genitourinary: Negative for dysuria and hematuria.  Musculoskeletal: Positive for arthralgias. Negative for myalgias.  Skin: Negative for pallor and rash.  Neurological: Negative for weakness and headaches.  Hematological: Negative for adenopathy.  Psychiatric/Behavioral: Positive for dysphoric mood.     Past Medical History: Past Medical History:  Diagnosis Date  . Adenomatous colon polyp   . ANXIETY DEPRESSION 02/15/2009  . AORTIC STENOSIS 07/07/2010   a. echo 12/23/10: EF 60-65%, mod to severe AS with mean gradient 29 mmHg;  b. 04/2011 s/p bioprosthetic AVR.  Marland Kitchen Barrett's esophagus 04/14/2007  . Barrett's esophagus 2000  . CAD, NATIVE VESSEL 05/17/2010   a. NSTEMI 8/11 - BMS to RCA;  b. cath 4/12: dLAD 50%, RI 80% (PCI), AVCFX 30%, pRCA 30%, stent ok, 40-50, then 50% dist RCA;  c. s/p Promus DES to RI 12/25/10;  d. 04/2011 CABG x 1 (LIMA->LAD) @ time of AVR  . Carotid stenosis 05/17/2010   a. dopplers 60/63: RICA 01-60%; LICA 1-09% (follow up due 11/12)  . Chronic bronchitis ([place])    "multiple times" (03/25/2013)  . 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 Exertional shortness of breath    "related to COPD problems" (03/25/2013)  . GERD (gastroesophageal reflux disease)   . H/O hiatal hernia   . Hemorrhoid thrombosis    "had it lanced" (03/25/2013)  . Herpes simplex without mention of complication 11/24/5571  .  History of blood transfusion    'w/OHS" (03/25/2013)  . [place], MILD 08/29/2009  . HYPERTENSION 04/14/2007  . MIGRAINE HEADACHE 04/20/2008  . Myocardial infarction [facility]) 2011   "during catherization" (03/25/2013)  . OSTEOPENIA 04/14/2007  . Ovarian cyst   . Pneumonia    "multiple times" (03/25/2013)  . URINARY INCONTINENCE, STRESS, MILD 08/26/2007  . Urine,  incontinence, stress female      Past Surgical History: Past Surgical History:  Procedure Laterality Date  . AORTIC VALVE REPLACEMENT  2012  . APPENDECTOMY    . BLEPHAROPLASTY Bilateral ~ 1998  . CARDIAC CATHETERIZATION N/A 07/06/2016   Procedure: Left Heart Cath and Cors/Grafts Angiography;  Surgeon: Sherren Mocha, MD;  Location: [facility];  Service: Cardiovascular;  Laterality: N/A;  . CHOLECYSTECTOMY N/A 03/25/2013   Procedure: LAPAROSCOPIC CHOLECYSTECTOMY WITH INTRAOPERATIVE CHOLANGIOGRAM;  Surgeon: Imogene Burn. Georgette Dover, MD;  Location: [facility];  Service: General;  Laterality: N/A;  . CORONARY ANGIOPLASTY WITH STENT PLACEMENT  2010   "I have 2 stents" (03/25/2013)  . NASAL SINUS SURGERY  1997; 1998   "cust in maxillary sinus; put a stent in then removed it" (03/25/2013)  . OVARIAN CYST REMOVAL Right 2011   "size of a soccer ball" (03/25/2013)  . thrombosed hemorrohid lanced    . TONSILLECTOMY AND ADENOIDECTOMY    . TOTAL ABDOMINAL HYSTERECTOMY       Family History: Family History  Problem Relation Age of Onset  . Coronary artery disease Other   . Heart disease Other        6 stents  . Heart disease Mother   . COPD Father        smoked  . Heart disease Father   . Rheum arthritis Father   . Heart disease Maternal Uncle   . Breast cancer Daughter   . Asthma Son        as a child   . Rheum arthritis Daughter   . Breast cancer Daughter   . Colon cancer Neg Hx   . Colon polyps Neg Hx     Social History: Social History   Socioeconomic History  . Marital status: Single    Spouse name: None  . Number of children: 3  . Years of education: None  . Highest education level: None  Social Needs  . Financial resource strain: None  . Food insecurity - worry: None  . Food insecurity - inability: None  . Transportation needs - medical: None  . Transportation needs - non-medical: None  Occupational History  . Occupation: Glass blower/designer: [id number]  Tobacco Use  .  Smoking status: Former Smoker    Packs/day: 1.50    Years: 44.00    Pack years: 66.00    Types: Cigarettes    Last attempt to quit: 09/03/2005    Years since quitting: 12.1  . Smokeless tobacco: Never Used  Substance and Sexual Activity  . Alcohol use: Yes    Alcohol/week: 0.0 oz  . Drug use: No  . Sexual activity: No  Other Topics Concern  . None  Social History Narrative   Prolonged engagement 17 years in 2017. 3 children. 6 grandkids. 2 greatgrandkids. Keeps greatgrandson on tuesdays- loves her so much.       Retired from [employer] (parttime- Dec 12)   Faith is very important to her      Hobbies: reading, cross stich, sew. Wants to learn to knit.        Allergies: Allergies  Allergen Reactions  .  Codeine Sulfate Rash    REACTION: unspecified  . Hydrocodone-Acetaminophen Rash    REACTION: unspecified    Outpatient Meds: Current Outpatient Medications  Medication Sig Dispense Refill  . albuterol (PROVENTIL HFA;VENTOLIN HFA) 108 (90 Base) MCG/ACT inhaler Inhale 2 puffs into the lungs every 6 (six) hours as needed for wheezing or shortness of breath.    Marland Kitchen aspirin 81 MG tablet Take 81 mg by mouth daily.      . cetirizine (ZYRTEC) 10 MG tablet Take 10 mg by mouth daily.    Marland Kitchen desvenlafaxine (PRISTIQ) 100 MG 24 hr tablet TAKE 1 TABLET BY MOUTH ONCE DAILY 30 tablet 5  . dexlansoprazole (DEXILANT) 60 MG capsule Take 1 capsule (60 mg total) by mouth daily. 30 capsule 2  . fluticasone (FLONASE) 50 MCG/ACT nasal spray Place 2 sprays into both nostrils daily. 16 g 1  . gabapentin (NEURONTIN) 100 MG capsule TAKE 1 CAPSULE BY MOUTH AT BEDTIME 90 capsule 1  . Multiple Vitamins-Minerals (MULTIVITAMIN ADULT PO) Take 1 tablet by mouth daily.     . nitroGLYCERIN (NITROSTAT) 0.4 MG SL tablet Place 0.4 mg under the tongue every 5 (five) minutes as needed for chest pain.    Marland Kitchen propranolol (INDERAL) 20 MG tablet Take 1 tablet (20 mg total) by mouth 2 (two) times daily. 60 tablet 5  . rosuvastatin  (CRESTOR) 20 MG tablet TAKE 1 TABLET BY MOUTH DAILY 90 tablet 3  . sertraline (ZOLOFT) 50 MG tablet TAKE 1 TABLET BY MOUTH ONCE DAILY 90 tablet 1  . SPIRIVA HANDIHALER 18 MCG inhalation capsule INHALE 1 PUFF BY MOUTH ONCE DAILY 90 capsule 1  . SYMBICORT 160-4.5 MCG/ACT inhaler INHALE TWO PUFFS BY MOUTH TWICE DAILY 10.2 g 11   No current facility-administered medications for this visit.     No other new meds in last 6 months except dexilant  ___________________________________________________________________ Objective   Exam:  BP 130/80 (BP Location: Left Arm, Patient Position: Sitting, Cuff Size: Normal)   Pulse 64   Ht 5\' 2"  (1.575 m) Comment: height measured without shoes  Wt 162 lb (73.5 kg)   BMI 29.63 kg/m    General: this is a(n) well-appearing woman  Eyes: sclera anicteric, no redness  ENT: oral mucosa moist without lesions, no cervical or supraclavicular lymphadenopathy, good dentition  CV: RRR without murmur, S1/S2, no JVD, no peripheral edema  Resp: Globally decreased breath sounds bilaterally, normal RR and effort noted, breathing comfortably on room air  GI: soft, no tenderness, with active bowel sounds.  Rectus diastasis.  No guarding or palpable organomegaly noted.  Skin; warm and dry, no rash or jaundice noted  Neuro: awake, alert and oriented x 3. Normal gross motor function and fluent speech    Assessment: Encounter Diagnoses  Name Primary?  . Gastroesophageal reflux disease without esophagitis Yes  . Pharyngoesophageal dysphagia     Recent flare of GERD symptoms, especially at night.  While stress might aggravate heartburn, it should not be causing more regurgitation.  She has some symptoms that could be delayed gastric emptying as well.  Plan: Upper endoscopy.  She is agreeable after thorough discussion of procedure and risks.  The benefits and risks of the planned procedure were described in detail with the patient or (when appropriate) their  health care proxy.  Risks were outlined as including, but not limited to, bleeding, infection, perforation, adverse medication reaction leading to cardiac or pulmonary decompensation, or pancreatitis (if ERCP).  The limitation of incomplete mucosal visualization was also discussed.  No guarantees or warranties were given.  Possible gastric emptying study to follow, depending upon EGD results.  Thank you for the courtesy of this consult.  Please call me with any questions or concerns.  Nelida Meuse III  CC: Marin Olp, MD

## 2017-10-07 NOTE — Assessment & Plan Note (Signed)
S: poorly controlled on propranolol 20mg  BID. Home #s almost always over 140 BP Readings from Last 3 Encounters:  10/07/17 (!) 150/80  10/07/17 130/80  09/06/17 (!) 150/90  A/P: We discussed blood pressure goal of <140/90. Continue current meds but add hctz 12.5mg  and follow up within 2-3 months- she will see [place] sooner if Bp still running high at home

## 2017-10-07 NOTE — Progress Notes (Signed)
Subjective:  Katherine Foley is a 67 y.o. year old very pleasant female patient who presents for/with See problem oriented charting ROS- continued chest burning after meals, has some regurgitated food. No fever or chills. No chest congestion. No edema.    Past Medical History-  Patient Active Problem List   Diagnosis Date Noted  . 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 Depression 09/22/2015    Priority: High  . Former smoker 09/22/2015    Priority: High  . S/P AVR (aortic valve replacement) 11/06/2013    Priority: High  . Aortic stenosis 07/07/2010    Priority: High  . CAD, NATIVE VESSEL 05/17/2010    Priority: High  . COPD GOLD II  07/23/2007    Priority: High  . Hot flashes 03/22/2016    Priority: Medium  . Diastolic dysfunction 23/53/6144    Priority: Medium  . GERD (gastroesophageal reflux disease) 06/04/2014    Priority: Medium  . Carotid stenosis 05/17/2010    Priority: Medium  . HLD (hyperlipidemia) 08/29/2009    Priority: Medium  . Essential hypertension 04/14/2007    Priority: Medium  . Osteopenia 04/14/2007    Priority: Medium  . Allergic rhinitis 09/22/2015    Priority: Low  . Adenomatous colon polyp     Priority: Low  . Herpes simplex virus (HSV) infection 01/16/2010    Priority: Low    Medications- reviewed and updated Current Outpatient Medications  Medication Sig Dispense Refill  . albuterol (PROVENTIL HFA;VENTOLIN HFA) 108 (90 Base) MCG/ACT inhaler Inhale 2 puffs into the lungs every 6 (six) hours as needed for wheezing or shortness of breath.    Marland Kitchen aspirin 81 MG tablet Take 81 mg by mouth daily.      . cetirizine (ZYRTEC) 10 MG tablet Take 10 mg by mouth daily.    Marland Kitchen desvenlafaxine (PRISTIQ) 100 MG 24 hr tablet TAKE 1 TABLET BY MOUTH ONCE DAILY 30 tablet 5  . dexlansoprazole (DEXILANT) 60 MG capsule Take 1 capsule (60 mg total) by mouth daily. 30 capsule 2  . fluticasone (FLONASE) 50 MCG/ACT nasal spray Place 2 sprays into both nostrils  daily. 16 g 1  . gabapentin (NEURONTIN) 100 MG capsule TAKE 1 CAPSULE BY MOUTH AT BEDTIME 90 capsule 1  . Multiple Vitamins-Minerals (MULTIVITAMIN ADULT PO) Take 1 tablet by mouth daily.     . nitroGLYCERIN (NITROSTAT) 0.4 MG SL tablet Place 0.4 mg under the tongue every 5 (five) minutes as needed for chest pain.    Marland Kitchen propranolol (INDERAL) 20 MG tablet Take 1 tablet (20 mg total) by mouth 2 (two) times daily. 60 tablet 5  . rosuvastatin (CRESTOR) 20 MG tablet TAKE 1 TABLET BY MOUTH DAILY 90 tablet 3  . sertraline (ZOLOFT) 50 MG tablet TAKE 1 TABLET BY MOUTH ONCE DAILY 90 tablet 1  . SPIRIVA HANDIHALER 18 MCG inhalation capsule INHALE 1 PUFF BY MOUTH ONCE DAILY 90 capsule 1  . SYMBICORT 160-4.5 MCG/ACT inhaler INHALE TWO PUFFS BY MOUTH TWICE DAILY 10.2 g 11  . hydrochlorothiazide (HYDRODIURIL) 12.5 MG tablet Take 1 tablet (12.5 mg total) by mouth daily. 30 tablet 5   No current facility-administered medications for this visit.     Objective: BP (!) 150/80 (BP Location: Left Arm, Patient Position: Sitting, Cuff Size: Large)   Pulse 66   Temp (!) 97.5 F (36.4 C) (Oral)   Ht 5\' 2"  (1.575 m)   Wt 162 lb 6.4 oz (73.7 kg)   SpO2 95%  BMI 29.70 kg/m  Gen: NAD, resting comfortably Mild sinus tenderness reported, Mucous membranes are moist. CV: RRR 2/6 SEM Lungs: CTAB no crackles, wheeze, rhonchi Abdomen: soft/nontender/nondistended/normal bowel sounds. overweight Ext: no edema Skin: warm, dry  Assessment/Plan:  Patient has also had sinus pressure for the last 2 days- we discussed at this point it is too early to diagnose as bacterial sinusitis- she will let us know if symptoms persist to 10 days or if has significant worsening such as fever or continued worsening symptoms.   Essential hypertension S: poorly controlled on propranolol 20mg  BID. Home #s almost always over 140 BP Readings from Last 3 Encounters:  10/07/17 (!) 150/80  10/07/17 130/80  09/06/17 (!) 150/90  A/P: We  discussed blood pressure goal of <140/90. Continue current meds but add hctz 12.5mg  and follow up within 2-3 months- she will see [place] sooner if Bp still running high at home  GERD (gastroesophageal reflux disease) S:  we started dexilant last visit and referred to GI given breakthrough on omeprazole of reflux A/P: with her history of Barrett's she will be getting updated EGD. With regurgitation issues may need gastric emptying study through GI  Future Appointments  Date Time Provider [place]  10/14/2017  2:00 PM Danis, Kirke Corin, MD [facility]  discussed 2-3 month follow up  Meds ordered this encounter  Medications  . hydrochlorothiazide (HYDRODIURIL) 12.5 MG tablet    Sig: Take 1 tablet (12.5 mg total) by mouth daily.    Dispense:  30 tablet    Refill:  5    Return precautions advised.  Garret Reddish, MD

## 2017-10-07 NOTE — Patient Instructions (Signed)
If you are age 69 or older, your body mass index should be between 23-30. Your Body mass index is 29.63 kg/m. If this is out of the aforementioned range listed, please consider follow up with your Primary Care Provider.  If you are age 69 or younger, your body mass index should be between 19-25. Your Body mass index is 29.63 kg/m. If this is out of the aformentioned range listed, please consider follow up with your Primary Care Provider.   You have been scheduled for an endoscopy. Please follow written instructions given to you at your visit today. If you use inhalers (even only as needed), please bring them with you on the day of your procedure. Your physician has requested that you go to www.startemmi.com and enter the access code given to you at your visit today. This web site gives a general overview about your procedure. However, you should still follow specific instructions given to you by our office regarding your preparation for the procedure.  Thank you for choosing [facility]  Dr Wilfrid Lund III

## 2017-10-07 NOTE — Assessment & Plan Note (Signed)
S:  we started dexilant last visit and referred to GI given breakthrough on omeprazole of reflux A/P: with her history of Barrett's she will be getting updated EGD. With regurgitation issues may need gastric emptying study through GI

## 2017-10-14 ENCOUNTER — Ambulatory Visit (AMBULATORY_SURGERY_CENTER): Payer: PPO | Admitting: Gastroenterology

## 2017-10-14 ENCOUNTER — Encounter: Payer: Self-pay | Admitting: Gastroenterology

## 2017-10-14 ENCOUNTER — Other Ambulatory Visit: Payer: Self-pay

## 2017-10-14 VITALS — BP 117/67 | HR 55 | Temp 98.2°F | Resp 13 | Ht 62.0 in | Wt 162.0 lb

## 2017-10-14 DIAGNOSIS — K219 Gastro-esophageal reflux disease without esophagitis: Secondary | ICD-10-CM

## 2017-10-14 DIAGNOSIS — R140 Abdominal distension (gaseous): Secondary | ICD-10-CM

## 2017-10-14 DIAGNOSIS — R6881 Early satiety: Secondary | ICD-10-CM

## 2017-10-14 MED ORDER — SODIUM CHLORIDE 0.9 % IV SOLN: 500.0000 mL | Freq: Once | INTRAVENOUS | Status: DC

## 2017-10-14 NOTE — Op Note (Signed)
[facility] Patient Name: Katherine Foley Procedure Date: 10/14/2017 1:52 PM MRN: [id number] Endoscopist: Mallie Mussel L. Loletha Carrow , MD Age: 67 Referring MD:  Date of Birth: 1950-10-09 Gender: Female Account #: [id number] Procedure:                Upper GI endoscopy Indications:              Esophageal reflux symptoms that persist despite                            appropriate therapy, Abdominal bloating, Early                            satiety Medicines:                Monitored Anesthesia Care Procedure:                Pre-Anesthesia Assessment:                           - Prior to the procedure, a History and Physical                            was performed, and patient medications and                            allergies were reviewed. The patient's tolerance of                            previous anesthesia was also reviewed. The risks                            and benefits of the procedure and the sedation                            options and risks were discussed with the patient.                            All questions were answered, and informed consent                            was obtained. Anticoagulants: The patient has taken                            aspirin. It was decided not to withhold this                            medication prior to the procedure. ASA Grade                            Assessment: II - A patient with mild systemic                            disease. After reviewing the risks and benefits,  the patient was deemed in satisfactory condition to                            undergo the procedure.                           After obtaining informed consent, the endoscope was                            passed under direct vision. Throughout the                            procedure, the patient's blood pressure, pulse, and                            oxygen saturations were monitored continuously. The   Endoscope was introduced through the mouth, and                            advanced to the second part of duodenum. The upper                            GI endoscopy was accomplished without difficulty.                            The patient tolerated the procedure well. Scope In: Scope Out: Findings:                 The larynx was normal.                           The esophagus was normal.                           The stomach was normal.                           The examined duodenum was normal. Complications:            No immediate complications. Estimated Blood Loss:     Estimated blood loss: none. Impression:               - Normal larynx.                           - Normal esophagus.                           - Normal stomach.                           - Normal examined duodenum.                           - No specimens collected. Recommendation:           - Patient has a contact number available for  emergencies. The signs and symptoms of potential                            delayed complications were discussed with the                            patient. Return to normal activities tomorrow.                            Written discharge instructions were provided to the                            patient.                           - Resume previous diet.                           - Continue present medications.                           - Follow an antireflux regimen indefinitely.                           - Gastric emptying study to be scheduled. Henry L. Loletha Carrow, MD 10/14/2017 2:06:08 PM This report has been signed electronically.

## 2017-10-14 NOTE — Patient Instructions (Signed)
YOU HAD AN ENDOSCOPIC PROCEDURE TODAY AT [facility]:   Refer to the procedure report that was given to you for any specific questions about what was found during the examination.  If the procedure report does not answer your questions, please call your gastroenterologist to clarify.  If you requested that your care partner not be given the details of your procedure findings, then the procedure report has been included in a sealed envelope for you to review at your convenience later.  YOU SHOULD EXPECT: Some feelings of bloating in the abdomen. Passage of more gas than usual.  Walking can help get rid of the air that was put into your GI tract during the procedure and reduce the bloating. If you had a lower endoscopy (such as a colonoscopy or flexible sigmoidoscopy) you may notice spotting of blood in your stool or on the toilet paper. If you underwent a bowel prep for your procedure, you may not have a normal bowel movement for a few days.  Please Note:  You might notice some irritation and congestion in your nose or some drainage.  This is from the oxygen used during your procedure.  There is no need for concern and it should clear up in a day or so.  SYMPTOMS TO REPORT IMMEDIATELY:   Following upper endoscopy (EGD)  Vomiting of blood or coffee ground material  New chest pain or pain under the shoulder blades  Painful or persistently difficult swallowing  New shortness of breath  Fever of 100F or higher  Black, tarry-looking stools  For urgent or emergent issues, a gastroenterologist can be reached at any hour by calling [phone].   DIET:  We do recommend a small meal at first, but then you may proceed to your regular diet.  Drink plenty of fluids but you should avoid alcoholic beverages for 24 hours.  MEDICATIONS: Continue present medications. Follow an anti-reflux regimen indefinitely.  Please see handouts given to you by your recovery nurse.  FOLLOW UP:  Gastric  emptying study to be scheduled.  ACTIVITY:  You should plan to take it easy for the rest of today and you should NOT DRIVE or use heavy machinery until tomorrow (because of the sedation medicines used during the test).    FOLLOW UP: Our staff will call the number listed on your records the next business day following your procedure to check on you and address any questions or concerns that you may have regarding the information given to you following your procedure. If we do not reach you, we will leave a message.  However, if you are feeling well and you are not experiencing any problems, there is no need to return our call.  We will assume that you have returned to your regular daily activities without incident.  If any biopsies were taken you will be contacted by phone or by letter within the next 1-3 weeks.  Please call us at [phone] if you have not heard about the biopsies in 3 weeks.   Thank you for allowing [place] to provide for your healthcare needs today.  SIGNATURES/CONFIDENTIALITY: You and/or your care partner have signed paperwork which will be entered into your electronic medical record.  These signatures attest to the fact that that the information above on your After Visit Summary has been reviewed and is understood.  Full responsibility of the confidentiality of this discharge information lies with you and/or your care-partner.

## 2017-10-14 NOTE — Progress Notes (Signed)
To PACU, VSS. Report to [email or web address] 

## 2017-10-14 NOTE — Progress Notes (Signed)
I have reviewed the patient's medical history in detail and updated the computerized patient record.

## 2017-10-15 ENCOUNTER — Telehealth: Payer: Self-pay

## 2017-10-15 NOTE — Telephone Encounter (Signed)
  Follow up Call-  Call back number 10/14/2017  Post procedure Call Back phone  # [phone]  Permission to leave phone message Yes  Some recent data might be hidden     Patient questions:  Do you have a fever, pain , or abdominal swelling? No. Pain Score  0 *  Have you tolerated food without any problems? Yes.    Have you been able to return to your normal activities? Yes.    Do you have any questions about your discharge instructions: Diet   No. Medications  No. Follow up visit  No.  Do you have questions or concerns about your Care? No.  Actions: * If pain score is 4 or above: No action needed, pain <4.  No problems noted per pt. maw

## 2017-10-16 ENCOUNTER — Telehealth: Payer: Self-pay

## 2017-10-16 ENCOUNTER — Other Ambulatory Visit: Payer: Self-pay

## 2017-10-16 DIAGNOSIS — K219 Gastro-esophageal reflux disease without esophagitis: Secondary | ICD-10-CM

## 2017-10-16 DIAGNOSIS — R140 Abdominal distension (gaseous): Secondary | ICD-10-CM

## 2017-10-16 DIAGNOSIS — R6881 Early satiety: Secondary | ICD-10-CM

## 2017-10-16 NOTE — Telephone Encounter (Signed)
4 hour gastric emptying scan has been scheduled for [facility] hospital for 10-23-2017 at 715 am. Pt notified and aware. Instructions have been given over the phone and also mailed.

## 2017-10-23 ENCOUNTER — Encounter (HOSPITAL_COMMUNITY)
Admission: RE | Admit: 2017-10-23 | Discharge: 2017-10-23 | Disposition: A | Discharge status: Home / Self Care | Payer: PPO | Source: Ambulatory Visit | Attending: Gastroenterology | Admitting: Gastroenterology

## 2017-10-23 DIAGNOSIS — R6881 Early satiety: Secondary | ICD-10-CM | POA: Insufficient documentation

## 2017-10-23 DIAGNOSIS — K219 Gastro-esophageal reflux disease without esophagitis: Secondary | ICD-10-CM | POA: Insufficient documentation

## 2017-10-23 DIAGNOSIS — R140 Abdominal distension (gaseous): Secondary | ICD-10-CM | POA: Insufficient documentation

## 2017-10-23 MED ORDER — TECHNETIUM TC 99M SULFUR COLLOID
2.1000 | Freq: Once | INTRAVENOUS | Status: AC
  Administered 2017-10-23: 2.1 via ORAL

## 2017-10-30 ENCOUNTER — Other Ambulatory Visit: Payer: Self-pay

## 2017-10-30 MED ORDER — METOCLOPRAMIDE HCL 5 MG PO TABS: 5.0000 mg | ORAL_TABLET | Freq: Every day | ORAL | 0 refills | Status: DC

## 2017-11-17 ENCOUNTER — Other Ambulatory Visit: Payer: Self-pay | Admitting: Family Medicine

## 2017-12-05 ENCOUNTER — Other Ambulatory Visit: Payer: Self-pay | Admitting: Family Medicine

## 2017-12-09 ENCOUNTER — Encounter: Payer: Self-pay | Admitting: Family Medicine

## 2017-12-09 ENCOUNTER — Ambulatory Visit (INDEPENDENT_AMBULATORY_CARE_PROVIDER_SITE_OTHER): Payer: PPO | Admitting: Family Medicine

## 2017-12-09 VITALS — BP 148/88 | HR 71 | Temp 97.5°F | Ht 62.0 in | Wt 158.8 lb

## 2017-12-09 DIAGNOSIS — J449 Chronic obstructive pulmonary disease, unspecified: Secondary | ICD-10-CM | POA: Diagnosis not present

## 2017-12-09 DIAGNOSIS — F331 Major depressive disorder, recurrent, moderate: Secondary | ICD-10-CM

## 2017-12-09 MED ORDER — LORAZEPAM 0.5 MG PO TABS: 0.5000 mg | ORAL_TABLET | Freq: Two times a day (BID) | ORAL | 0 refills | Status: DC | PRN

## 2017-12-09 MED ORDER — PREDNISONE 20 MG PO TABS
ORAL_TABLET | ORAL | 0 refills | Status: DC
Start: 2017-12-09 — End: 2018-01-03

## 2017-12-09 MED ORDER — ALBUTEROL SULFATE 108 (90 BASE) MCG/ACT IN AEPB: 1.0000 | INHALATION_SPRAY | Freq: Four times a day (QID) | RESPIRATORY_TRACT | 3 refills | Status: DC | PRN

## 2017-12-09 MED ORDER — FLUTICASONE PROPIONATE 50 MCG/ACT NA SUSP: 2.0000 | Freq: Every day | NASAL | 1 refills | Status: DC

## 2017-12-09 NOTE — Patient Instructions (Addendum)
For breathing from lung issues- prednisone for a week. See [place] back if not improving  For sinus congestion- stick with the flonase for another 2 weeks at least  I sent in ativan to try to help with the anxiety of it all. You can use up to twice a day on hard days but no driving for 8 hours afterwards.   If you have any thoughts of self harm, please call 911 or [place] immediately  If you dont mind Id love to see you in the next 6-12 weeks to check in on you.

## 2017-12-09 NOTE — Progress Notes (Signed)
Subjective:  Katherine Foley is a 67 y.o. year old very pleasant female patient who presents for/with See problem oriented charting ROS- has some wheezing and increased cough. No edema. Intense anxiety. Periods of crying.    Past Medical History-  Patient Active Problem List   Diagnosis Date Noted  . 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 Depression 09/22/2015    Priority: High  . Former smoker 09/22/2015    Priority: High  . S/P AVR (aortic valve replacement) 11/06/2013    Priority: High  . Aortic stenosis 07/07/2010    Priority: High  . CAD, NATIVE VESSEL 05/17/2010    Priority: High  . COPD GOLD II  07/23/2007    Priority: High  . Hot flashes 03/22/2016    Priority: Medium  . Diastolic dysfunction 88/50/2774    Priority: Medium  . GERD (gastroesophageal reflux disease) 06/04/2014    Priority: Medium  . Carotid stenosis 05/17/2010    Priority: Medium  . HLD (hyperlipidemia) 08/29/2009    Priority: Medium  . Essential hypertension 04/14/2007    Priority: Medium  . Osteopenia 04/14/2007    Priority: Medium  . Allergic rhinitis 09/22/2015    Priority: Low  . Adenomatous colon polyp     Priority: Low  . Herpes simplex virus (HSV) infection 01/16/2010    Priority: Low    Medications- reviewed and updated Current Outpatient Medications  Medication Sig Dispense Refill  . albuterol (PROVENTIL HFA;VENTOLIN HFA) 108 (90 Base) MCG/ACT inhaler Inhale 2 puffs into the lungs every 6 (six) hours as needed for wheezing or shortness of breath.    Marland Kitchen aspirin 81 MG tablet Take 81 mg by mouth daily.      . cetirizine (ZYRTEC) 10 MG tablet Take 10 mg by mouth daily.    Marland Kitchen desvenlafaxine (PRISTIQ) 100 MG 24 hr tablet TAKE 1 TABLET BY MOUTH ONCE DAILY 30 tablet 5  . DEXILANT 60 MG capsule TAKE 1 CAPSULE BY MOUTH ONCE DAILY 30 capsule 2  . fluticasone (FLONASE) 50 MCG/ACT nasal spray Place 2 sprays into both nostrils daily. 16 g 1  . gabapentin (NEURONTIN) 100 MG capsule  TAKE 1 CAPSULE BY MOUTH AT BEDTIME 90 capsule 1  . hydrochlorothiazide (HYDRODIURIL) 12.5 MG tablet Take 1 tablet (12.5 mg total) by mouth daily. 30 tablet 5  . metoCLOPramide (REGLAN) 5 MG tablet Take 1 tablet (5 mg total) by mouth at bedtime. 28 tablet 0  . Multiple Vitamins-Minerals (MULTIVITAMIN ADULT PO) Take 1 tablet by mouth daily.     . nitroGLYCERIN (NITROSTAT) 0.4 MG SL tablet Place 0.4 mg under the tongue every 5 (five) minutes as needed for chest pain.    Marland Kitchen propranolol (INDERAL) 20 MG tablet Take 1 tablet (20 mg total) by mouth 2 (two) times daily. 60 tablet 5  . rosuvastatin (CRESTOR) 20 MG tablet TAKE 1 TABLET BY MOUTH DAILY 90 tablet 3  . sertraline (ZOLOFT) 50 MG tablet TAKE 1 TABLET BY MOUTH ONCE DAILY 90 tablet 1  . SPIRIVA HANDIHALER 18 MCG inhalation capsule INHALE 1 PUFF BY MOUTH ONCE DAILY 90 capsule 1  . SYMBICORT 160-4.5 MCG/ACT inhaler INHALE TWO PUFFS BY MOUTH TWICE DAILY 10.2 g 11   No current facility-administered medications for this visit.     Objective: BP (!) 148/88 (BP Location: Left Arm, Patient Position: Sitting, Cuff Size: Large)   Pulse 71   Temp (!) 97.5 F (36.4 C) (Oral)   Ht 5\' 2"  (1.575 m)  Wt 158 lb 12.8 oz (72 kg)   SpO2 98%   BMI 29.04 kg/m  Gen: NAD, resting comfortably Tearful throughout visit CV: RRR no murmurs rubs or gallops Lungs: occasional wheeze. o crackles, rhonchi Ext: no edema Skin: warm, dry  Assessment/Plan:  BP controlled at last check- likely up due to anxiety- suspect will improve- will have staff reach out for 2-3 week recheck  COPD GOLD II  S: 2-3 days of slight increase wheezing, shortness of breath. Albuterol helping. Some increased cough and congestion. No fever or shortness of breath A/P: very mild COPD exacerbation- will trial course of lower dose prednisone  Depression S: Patient with history of depression. Has been on pristiq 100mg  24 hour as well as zoloft 50mg  for sometime. Current phq9 of 24. She  reports recent anxiety. GAD7 of 19 today. No SI but does report that she feels she would be better off not having to deal with this suffering- but wouldn't do anything to achieve this. Agrees to seek care if has SI.   Losing her daughter [name] with 6-12 months to live to apparently metastatic breast cancer without obvious primary cancer found in the breast- reportedly extremely rare per her daughts oncologist.  A/P: Primarily grief but with baseline depression.   extended counseling today. Patient needs something for more intense moments of anxiety- agreed to short course of ativan. May refill in the future if needed- want to see how long it takes for her to go through 25- discussed sparing use for the tougher days. Considered buspar but do not want to increase serotonin syndrome risk. Counseling would be beneficialy but she is not interested at the moment as will be spending most of her time helping her daughter.     Future Appointments  Date Time Provider [place]  12/11/2017  1:30 PM Danis, Kirke Corin, MD [facility]    Meds ordered this encounter  Medications  . fluticasone (FLONASE) 50 MCG/ACT nasal spray    Sig: Place 2 sprays into both nostrils daily.    Dispense:  16 g    Refill:  1  . Albuterol Sulfate (PROAIR RESPICLICK) 161 (90 Base) MCG/ACT AEPB    Sig: Inhale 1 puff into the lungs every 6 (six) hours as needed (wheezing or shortness of breath).    Dispense:  1 each    Refill:  3  . predniSONE (DELTASONE) 20 MG tablet    Sig: Take 1 tablet by mouth daily for 5 days, then 1/2 tablet daily for 2 days    Dispense:  6 tablet    Refill:  0  . LORazepam (ATIVAN) 0.5 MG tablet    Sig: Take 1 tablet (0.5 mg total) by mouth 2 (two) times daily as needed for anxiety (dont drive 8 hours after using).    Dispense:  25 tablet    Refill:  0   Time Stamp The duration of face-to-face time during this visit was greater than 25 minutes. Greater than 50% of this time was  spent in counseling, explanation of diagnosis, planning of further management, and/or coordination of care including counseling related to mourning the illness in her daughter.     Return precautions advised.  Garret Reddish, MD

## 2017-12-09 NOTE — Assessment & Plan Note (Signed)
S: 2-3 days of slight increase wheezing, shortness of breath. Albuterol helping. Some increased cough and congestion. No fever or shortness of breath A/P: very mild COPD exacerbation- will trial course of lower dose prednisone

## 2017-12-09 NOTE — Assessment & Plan Note (Addendum)
S: Patient with history of depression. Has been on pristiq 100mg  24 hour as well as zoloft 50mg  for sometime. Current phq9 of 24. She reports recent anxiety. GAD7 of 19 today. No SI but does report that she feels she would be better off not having to deal with this suffering- but wouldn't do anything to achieve this. Agrees to seek care if has SI.   Losing her daughter [name] with 6-12 months to live to apparently metastatic breast cancer without obvious primary cancer found in the breast- reportedly extremely rare per her daughts oncologist.  A/P: Primarily grief but with baseline depression.   extended counseling today. Patient needs something for more intense moments of anxiety- agreed to short course of ativan. May refill in the future if needed- want to see how long it takes for her to go through 25- discussed sparing use for the tougher days. Considered buspar but do not want to increase serotonin syndrome risk. Counseling would be beneficialy but she is not interested at the moment as will be spending most of her time helping her daughter.

## 2017-12-11 ENCOUNTER — Ambulatory Visit: Payer: PPO | Admitting: Gastroenterology

## 2018-01-03 ENCOUNTER — Encounter: Payer: Self-pay | Admitting: Family Medicine

## 2018-01-03 ENCOUNTER — Ambulatory Visit (INDEPENDENT_AMBULATORY_CARE_PROVIDER_SITE_OTHER): Payer: PPO | Admitting: Family Medicine

## 2018-01-03 VITALS — BP 132/82 | HR 60 | Temp 98.2°F | Ht 62.0 in | Wt 158.4 lb

## 2018-01-03 DIAGNOSIS — I10 Essential (primary) hypertension: Secondary | ICD-10-CM | POA: Diagnosis not present

## 2018-01-03 DIAGNOSIS — F331 Major depressive disorder, recurrent, moderate: Secondary | ICD-10-CM | POA: Diagnosis not present

## 2018-01-03 NOTE — Patient Instructions (Addendum)
Health Maintenance Due  Topic Date Due  . MAMMOGRAM - Informed patient to schedule one with her OB/GYN. 12/06/2017   Blood pressure looks much better  Follow-up with [place] in 2 months or sooner if needed  Please strongly consider follow-up with hospice counseling or 1 of our counselors.  Please let us know immediately if you feel like your depression is worsening.  Also contact us immediately if you have any thoughts of self-harm

## 2018-01-03 NOTE — Progress Notes (Signed)
Subjective:  Katherine Foley is a 67 y.o. year old very pleasant female patient who presents for/with See problem oriented charting ROS- wheezing and shortness of breath improved with prednisone. No chest pain reported. Admits to multiple periods of crying due to how her daughter is doing.    Past Medical History-  Patient Active Problem List   Diagnosis Date Noted  . 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 Depression 09/22/2015    Priority: High  . Former smoker 09/22/2015    Priority: High  . S/P AVR (aortic valve replacement) 11/06/2013    Priority: High  . Aortic stenosis 07/07/2010    Priority: High  . CAD, NATIVE VESSEL 05/17/2010    Priority: High  . COPD GOLD II  07/23/2007    Priority: High  . Hot flashes 03/22/2016    Priority: Medium  . Diastolic dysfunction 18/29/9371    Priority: Medium  . GERD (gastroesophageal reflux disease) 06/04/2014    Priority: Medium  . Carotid stenosis 05/17/2010    Priority: Medium  . HLD (hyperlipidemia) 08/29/2009    Priority: Medium  . Essential hypertension 04/14/2007    Priority: Medium  . Osteopenia 04/14/2007    Priority: Medium  . Allergic rhinitis 09/22/2015    Priority: Low  . Adenomatous colon polyp     Priority: Low  . Herpes simplex virus (HSV) infection 01/16/2010    Priority: Low    Medications- reviewed and updated Current Outpatient Medications  Medication Sig Dispense Refill  . Albuterol Sulfate (PROAIR RESPICLICK) 696 (90 Base) MCG/ACT AEPB Inhale 1 puff into the lungs every 6 (six) hours as needed (wheezing or shortness of breath). 1 each 3  . aspirin 81 MG tablet Take 81 mg by mouth daily.      . cetirizine (ZYRTEC) 10 MG tablet Take 10 mg by mouth daily.    Marland Kitchen desvenlafaxine (PRISTIQ) 100 MG 24 hr tablet TAKE 1 TABLET BY MOUTH ONCE DAILY 30 tablet 5  . DEXILANT 60 MG capsule TAKE 1 CAPSULE BY MOUTH ONCE DAILY 30 capsule 2  . fluticasone (FLONASE) 50 MCG/ACT nasal spray Place 2 sprays into both  nostrils daily. 16 g 1  . gabapentin (NEURONTIN) 100 MG capsule TAKE 1 CAPSULE BY MOUTH AT BEDTIME 90 capsule 1  . hydrochlorothiazide (HYDRODIURIL) 12.5 MG tablet Take 1 tablet (12.5 mg total) by mouth daily. 30 tablet 5  . LORazepam (ATIVAN) 0.5 MG tablet Take 1 tablet (0.5 mg total) by mouth 2 (two) times daily as needed for anxiety (dont drive 8 hours after using). 25 tablet 0  . metoCLOPramide (REGLAN) 5 MG tablet Take 1 tablet (5 mg total) by mouth at bedtime. 28 tablet 0  . Multiple Vitamins-Minerals (MULTIVITAMIN ADULT PO) Take 1 tablet by mouth daily.     . nitroGLYCERIN (NITROSTAT) 0.4 MG SL tablet Place 0.4 mg under the tongue every 5 (five) minutes as needed for chest pain.    Marland Kitchen propranolol (INDERAL) 20 MG tablet Take 1 tablet (20 mg total) by mouth 2 (two) times daily. 60 tablet 5  . rosuvastatin (CRESTOR) 20 MG tablet TAKE 1 TABLET BY MOUTH DAILY 90 tablet 3  . sertraline (ZOLOFT) 50 MG tablet TAKE 1 TABLET BY MOUTH ONCE DAILY 90 tablet 1  . SPIRIVA HANDIHALER 18 MCG inhalation capsule INHALE 1 PUFF BY MOUTH ONCE DAILY 90 capsule 1  . SYMBICORT 160-4.5 MCG/ACT inhaler INHALE TWO PUFFS BY MOUTH TWICE DAILY 10.2 g 11   No current facility-administered  medications for this visit.     Objective: BP 132/82 (BP Location: Left Arm, Patient Position: Sitting, Cuff Size: Normal)   Pulse 60   Temp 98.2 F (36.8 C) (Oral)   Ht 5' 2"  (1.575 m)   Wt 158 lb 6.4 oz (71.8 kg)   SpO2 97%   BMI 28.97 kg/m  Gen: NAD, resting comfortably CV: RRR no murmurs rubs or gallops Lungs: CTAB no crackles, wheeze, rhonchi Abdomen: soft/nontender/nondistended/normal bowel sounds.  Ext: no edema Skin: warm, dry Neuro: normal gait and speech Tearful at times during visit  Assessment/Plan  Essential hypertension S: controlled today on Propranolol 54m BID , hctz 12.584m She was having extreme anxiety at last visit and BP was elevated at that time.  BP Readings from Last 3 Encounters:  01/03/18  132/82  12/09/17 (!) 148/88  10/14/17 117/67  A/P: We discussed blood pressure goal of <140/90. Continue current meds  Depression S: PHQ9 of 24 last visit and down to 19 again. She remains on pristiq 10069m4 hour as well as zoloft 75m100mhe continues to deal with losing her daughter ANgiJanace Hoard has 6-12 months to live from metastatic breast cancer. Does have PALB2 gene and she may be able to get into clinical trial.   We did do a short course of ativan at last visit as well #25. Avoiding buspar to reduce risk of serotonin syndrome. 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  Patient is considering hospice counseling-I encouraged her to follow through with this or to follow-up with 1 of our counselors.  She does not want to pursue psychiatry at this time.  Offered follow-up in 2 weeks to 2 months range- she wants to be 2 months but may postpone that further if needed  Return precautions advised.  StepGarret Reddish

## 2018-01-03 NOTE — Assessment & Plan Note (Signed)
S: controlled today on Propranolol 20mg  BID , hctz 12.5mg . She was having extreme anxiety at last visit and BP was elevated at that time.  BP Readings from Last 3 Encounters:  01/03/18 132/82  12/09/17 (!) 148/88  10/14/17 117/67  A/P: We discussed blood pressure goal of <140/90. Continue current meds

## 2018-01-03 NOTE — Assessment & Plan Note (Addendum)
S: PHQ9 of 24 last visit and down to 19 again. She remains on pristiq 171m 24 hour as well as zoloft 569m She continues to deal with losing her daughter ANJanace Hoardho has 6-12 months to live from metastatic breast cancer. Does have PALB2 gene and she may be able to get into clinical trial.   We did do a short course of ativan at last visit as well #25. Avoiding buspar to reduce risk of serotonin syndrome. 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  Patient is considering hospice counseling-I encouraged her to follow through with this or to follow-up with 1 of our counselors.  She does not want to pursue psychiatry at this time.  Offered follow-up in 2 weeks to 2 months range- she wants to be 2 months but may postpone that further if needed

## 2018-01-07 ENCOUNTER — Encounter: Payer: Self-pay | Admitting: Genetic Counselor

## 2018-01-07 ENCOUNTER — Telehealth: Payer: Self-pay | Admitting: Genetic Counselor

## 2018-01-07 NOTE — Telephone Encounter (Signed)
A genetic counseling appt has been scheduled for the pt to see Steele Berg on 5/31 at 2pm. Pt aware to arrive 30 minutes early. Letter mailed.

## 2018-01-28 ENCOUNTER — Telehealth: Payer: Self-pay | Admitting: Genetic Counselor

## 2018-01-28 NOTE — Telephone Encounter (Signed)
Pt's genetic counseling appt has been rescheduled for the pt to see Steele Berg on 6/3 at 11am per the pt's request.

## 2018-01-31 ENCOUNTER — Other Ambulatory Visit: Payer: PPO

## 2018-02-03 ENCOUNTER — Inpatient Hospital Stay: Payer: PPO

## 2018-02-03 ENCOUNTER — Encounter: Payer: Self-pay | Admitting: Genetic Counselor

## 2018-02-03 ENCOUNTER — Inpatient Hospital Stay: Payer: PPO | Attending: Genetic Counselor | Admitting: Genetic Counselor

## 2018-02-03 DIAGNOSIS — Z800 Family history of malignant neoplasm of digestive organs: Secondary | ICD-10-CM

## 2018-02-03 DIAGNOSIS — Z1501 Genetic susceptibility to malignant neoplasm of breast: Secondary | ICD-10-CM | POA: Diagnosis not present

## 2018-02-03 DIAGNOSIS — Z803 Family history of malignant neoplasm of breast: Secondary | ICD-10-CM | POA: Diagnosis not present

## 2018-02-03 DIAGNOSIS — Z315 Encounter for genetic counseling: Secondary | ICD-10-CM | POA: Diagnosis not present

## 2018-02-03 NOTE — Progress Notes (Signed)
[facility]      Initial Visit   Patient Name: Katherine Foley Patient DOB: Jan 11, 1951 Patient Age: 67 y.o. Encounter Date: 02/03/2018  Referring Provider: Self-referred  Primary Care Provider: Marin Olp, MD  Reason for Visit: Evaluate for hereditary susceptibility to cancer    Assessment and Plan:  . Katherine Foley has a daughter who was recently found to have a mutation in the PALB2 gene. We discussed that Katherine Foley may have this mutation, but it is more likely that her daughter inherited it from her father given that Katherine Foley is cancer-free as are her relatives.  . Testing is recommended to determine whether she has this pathogenic mutation that will impact her screening and risk-reduction for cancer. A negative result will be reassuring.  . Katherine Foley wished to pursue genetic testing and opted to have analysis of only the PALB2 gene. Her blood sample will be sent for analysis of the PALB2 gene to [facility] where her daughter had testing.   Marland Kitchen Results should be available in approximately 2-3 weeks, at which point we will contact her and address implications for her as well as address genetic testing for at-risk family members, if needed.     Dr. Jana Hakim was available for questions concerning this case. Total time spent by me in face-to-face counseling was approximately 30 minutes.   _____________________________________________________________________   History of Present Illness: Katherine Foley, a 67 y.o. female, is being seen at the [facility] due to a family history of cancer and a PALB2 mutation identified in the family. She presents to clinic today to discuss the implications of this for her and to undergo genetic testing.  Katherine Foley has no personal history of cancer. She reported that she had a partial hysterectomy at age 19 due to uterine fibroids and then completion BSO around age 35 due to a large ovarian  cyst.  She reported that she undergoes a yearly mammogram and clinical breast exam. She stated that a colonoscopy in 2018 was negative for polyps and she was asked to return in 63 years.  Past Medical History:  Diagnosis Date  . Adenomatous colon polyp   . ANXIETY DEPRESSION 02/15/2009  . AORTIC STENOSIS 07/07/2010   a. echo 12/23/10: EF 60-65%, mod to severe AS with mean gradient 29 mmHg;  b. 04/2011 s/p bioprosthetic AVR.  Marland Kitchen Barrett's esophagus 04/14/2007  . Barrett's esophagus 2000  . CAD, NATIVE VESSEL 05/17/2010   a. NSTEMI 8/11 - BMS to RCA;  b. cath 4/12: dLAD 50%, RI 80% (PCI), AVCFX 30%, pRCA 30%, stent ok, 40-50, then 50% dist RCA;  c. s/p Promus DES to RI 12/25/10;  d. 04/2011 CABG x 1 (LIMA->LAD) @ time of AVR  . Carotid stenosis 05/17/2010   a. dopplers 99/24: RICA 26-83%; LICA 4-19% (follow up due 11/12)  . Chronic bronchitis ([place])    "multiple times" (03/25/2013)  . 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 Exertional shortness of breath    "related to COPD problems" (03/25/2013)  . Family history of breast cancer    2 daughters; PALB2 mutation in family  . GERD (gastroesophageal reflux disease)   . H/O hiatal hernia   . Hemorrhoid thrombosis    "had it lanced" (03/25/2013)  . Herpes simplex without mention of complication 02/22/2978  . History of blood transfusion    'w/OHS" (03/25/2013)  . Fleming, MILD 08/29/2009  . HYPERTENSION 04/14/2007  .  MIGRAINE HEADACHE 04/20/2008  . Myocardial infarction (HCC) 2011   "during catherization" (03/25/2013)  . OSTEOPENIA 04/14/2007  . Ovarian cyst   . Pneumonia    "multiple times" (03/25/2013)  . URINARY INCONTINENCE, STRESS, MILD 08/26/2007  . Urine, incontinence, stress female     Past Surgical History:  Procedure Laterality Date  . AORTIC VALVE REPLACEMENT  2012  . APPENDECTOMY    . BLEPHAROPLASTY Bilateral ~ 1998  . CARDIAC CATHETERIZATION N/A 07/06/2016   Procedure: Left Heart Cath and Cors/Grafts  Angiography;  Surgeon: Michael Cooper, MD;  Location: [facility] INVASIVE CV [facility];  Service: Cardiovascular;  Laterality: N/A;  . CHOLECYSTECTOMY N/A 03/25/2013   Procedure: LAPAROSCOPIC CHOLECYSTECTOMY WITH INTRAOPERATIVE CHOLANGIOGRAM;  Surgeon: Matthew K. Tsuei, MD;  Location: [facility] OR;  Service: General;  Laterality: N/A;  . CORONARY ANGIOPLASTY WITH STENT PLACEMENT  2010   "I have 2 stents" (03/25/2013)  . NASAL SINUS SURGERY  1997; 1998   "cust in maxillary sinus; put a stent in then removed it" (03/25/2013)  . OVARIAN CYST REMOVAL Right 2011   "size of a soccer ball" (03/25/2013)  . thrombosed hemorrohid lanced    . TONSILLECTOMY AND ADENOIDECTOMY    . TOTAL ABDOMINAL HYSTERECTOMY      Social History   Socioeconomic History  . Marital status: Single    Spouse name: Not on file  . Number of children: 3  . Years of education: Not on file  . Highest education level: Not on file  Occupational History  . Occupation: Stocker    Employer: [employer]  Social Needs  . Financial resource strain: Not on file  . Food insecurity:    Worry: Not on file    Inability: Not on file  . Transportation needs:    Medical: Not on file    Non-medical: Not on file  Tobacco Use  . Smoking status: Former Smoker    Packs/day: 1.50    Years: 44.00    Pack years: 66.00    Types: Cigarettes    Last attempt to quit: 09/03/2005    Years since quitting: 12.4  . Smokeless tobacco: Never Used  Substance and Sexual Activity  . Alcohol use: Yes    Alcohol/week: 0.0 oz  . Drug use: No  . Sexual activity: Never  Lifestyle  . Physical activity:    Days per week: Not on file    Minutes per session: Not on file  . Stress: Not on file  Relationships  . Social connections:    Talks on phone: Not on file    Gets together: Not on file    Attends religious service: Not on file    Active member of club or organization: Not on file    Attends meetings of clubs or organizations: Not on file    Relationship status: Not on  file  Other Topics Concern  . Not on file  Social History Narrative   Prolonged engagement 17 years in 2017. 3 children. 6 grandkids. 2 greatgrandkids. Keeps greatgrandson on tuesdays- loves her so much.       Retired from [employer] (parttime- Dec 12)   Faith is very important to her      Hobbies: reading, cross stich, sew. Wants to learn to knit.         Family History:  During the visit, a 4-generation pedigree was obtained. Family tree will be scanned in the Media tab in Epic  Significant diagnoses include the following:  Family History  Problem Relation Age of Onset  .   Coronary artery disease Other   . Heart disease Other        6 stents  . Heart disease Mother   . COPD Father        smoked  . Heart disease Father   . Rheum arthritis Father   . Heart disease Maternal Uncle   . Breast cancer Daughter        currently 38  . Asthma Son        as a child   . Breast cancer Daughter 16       currently 104; PALB2 mutation  . Cancer Other        very distant paternal cousin; pancreatic ca  . Colon cancer Neg Hx   . Colon polyps Neg Hx     Additionally, Katherine Foley has a son (age 40) as well as her two daughters noted above. She has 2 full brothers and a sister. She also has a maternal half-brother and half-sister. She has a paternal half-brother and 2 half-sisters. Her mother died at 17. She had 2 brothers. Her father died at 1. He had one brother and one paternal half-sister.  Katherine Foley ancestry is Caucasian - NOS. There is no known Jewish ancestry and no consanguinity.  Discussion: We reviewed the characteristics, features and inheritance patterns of hereditary cancer syndromes with a focus on the PALB2 gene. We discussed her risk of harboring the PALB2 mutation or any other mutation in the context of her personal and family history. We discussed the process of genetic testing, insurance coverage and implications of results: positive, negative and variant of unknown  significance (VUS).   PALB2 Cancer Risks: It is important to note that the studies on cancer risk associated with the PALB2 gene are generally limited. For this reason, the cancer risk estimates below are likely to change as new data is obtained about PALB2 pathogenic mutations. We discussed that pathogenic mutations in PALB2 have been shown to increase the risk of breast cancer to 33-58% by age 68 depending on the research study. PALB2 mutations have also been associated with an increased risk of pancreatic cancer and possibly ovarian cancer, but specific risk estimates are not fully defined yet. Men with a PALB2 mutation may have an increased risk of female breast cancer and prostate cancer, but data is yet preliminary.    We discussed the recommended cancer screenings for women with a PALB2 mutation as outlined in the [employer]  and gave her a copy.   Katherine Foley questions were answered to her satisfaction today and she is welcome to call with any additional questions or concerns. Thank you for the referral and allowing [place] to share in the care of your patient.    Steele Berg, MS, [place] Certified Genetic Counselor phone: [phone] [email or web address]

## 2018-02-10 ENCOUNTER — Encounter: Payer: Self-pay | Admitting: Genetic Counselor

## 2018-02-10 ENCOUNTER — Ambulatory Visit: Payer: Self-pay | Admitting: Genetic Counselor

## 2018-02-10 DIAGNOSIS — Z1379 Encounter for other screening for genetic and chromosomal anomalies: Secondary | ICD-10-CM

## 2018-02-10 HISTORY — DX: Encounter for other screening for genetic and chromosomal anomalies: Z13.79

## 2018-02-10 NOTE — Progress Notes (Signed)
[facility]etics Clinic       Genetic Test Results    Patient Name: Katherine Foley Patient DOB: 12/28/1950 Patient Age: 67 y.o. Encounter Date: 02/10/2018  Referring Provider: Self-referred  Primary Care Provider: Marin Olp, MD   [name] was called today to discuss genetic test results. Please see the Genetics note from her visit on 02/03/2018 for a detailed discussion of her personal and family history.  Genetic Testing: At the time of [name]'s visit, she decided to pursue genetic testing of the PALB2 gene because of a mutation found in her daughter called c.[id number] (p.[id number]). Testing included sequencing and deletion/duplication analysis. Testing did not reveal the PALB2 pathogenic mutation.  We call this result a true negative result because the cancer-causing mutation was identified in [name]'s family, and she did not inherit it. Given this negative result, [name]'s chances of developing PALB2-related cancers are the same as they are in the general population. A copy of the genetic test report will be scanned into Epic under the Media tab.  The only gene analyzed was the PALB2 gene.  Since the current test is not perfect, it is possible that there may be a gene mutation that current testing cannot detect, but that chance is small. It is possible that a different genetic factor is present. Again, the likelihood of this is low. No additional testing is recommended at this time for [name].  Cancer Screening: These results are reassuring and indicate that [name] does not have an increased risk of cancer due to the familial PALB2 mutation. She is recommended to undergo cancer screenings for individuals in the general population.  The [employer]  recommends that women follow the breast screening recommendations below, but that these may need to be modified based on other risk factors such as dense breasts, biopsy  history or family history.  Breast awareness - Women should be familiar with their breasts and promptly report changes to their healthcare provider.   Starting at age 57: Breast exam, risk assessment, and risk reduction counseling by the provider and mammogram every year. The provider may discuss screening with tomosynthesis.  [name] is also recommended to undergo a yearly gynecologic exam and speak with her GI doctor as to when to have her next colonoscopy since her one in 2018 was reportedly negative.  Family Members: [name] other children are in the process of being evaluated for the PALB2 mutation found in one of her daughters.  Any relative who had cancer at a young age or had a particularly rare cancer may also wish to pursue genetic testing. Genetic counselors can be located in other cities, by visiting the website of the [employer] (ArtistMovie.se) and Field seismologist for a Dietitian by zip code.   Lastly, cancer genetics is a rapidly advancing field and it is possible that new genetic tests will be appropriate for [name] in the future. [name] is encouraged to remain in contact with Genetics on an annual basis so we can update her personal and family histories, and let her know of advances in cancer genetics that may benefit the family. [name] questions were answered to her satisfaction today, and she knows she is welcome to call anytime with additional questions.     Steele Berg, MS, [place] Certified Genetic Counselor phone: [phone]

## 2018-02-25 ENCOUNTER — Other Ambulatory Visit: Payer: Self-pay | Admitting: Family Medicine

## 2018-03-10 ENCOUNTER — Encounter: Payer: Self-pay | Admitting: Family Medicine

## 2018-03-10 ENCOUNTER — Ambulatory Visit (INDEPENDENT_AMBULATORY_CARE_PROVIDER_SITE_OTHER): Payer: PPO | Admitting: Family Medicine

## 2018-03-10 VITALS — BP 128/82 | HR 63 | Temp 98.5°F | Ht 62.0 in | Wt 156.6 lb

## 2018-03-10 DIAGNOSIS — M25551 Pain in right hip: Secondary | ICD-10-CM

## 2018-03-10 MED ORDER — PREDNISONE 20 MG PO TABS: ORAL_TABLET | ORAL | 0 refills | Status: DC

## 2018-03-10 NOTE — Progress Notes (Signed)
Subjective:  Katherine Foley is a 67 y.o. year old very pleasant female patient who presents for/with See problem oriented charting ROS- no fever or chills. No bruising/redness in area of pain. No nausea or vomiting. No fecal or urinary incontinence.    Past Medical History-  Patient Active Problem List   Diagnosis Date Noted  . 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 Depression 09/22/2015    Priority: High  . Former smoker 09/22/2015    Priority: High  . S/P AVR (aortic valve replacement) 11/06/2013    Priority: High  . Aortic stenosis 07/07/2010    Priority: High  . CAD, NATIVE VESSEL 05/17/2010    Priority: High  . COPD GOLD II  07/23/2007    Priority: High  . Hot flashes 03/22/2016    Priority: Medium  . Diastolic dysfunction 48/54/6270    Priority: Medium  . GERD (gastroesophageal reflux disease) 06/04/2014    Priority: Medium  . Carotid stenosis 05/17/2010    Priority: Medium  . HLD (hyperlipidemia) 08/29/2009    Priority: Medium  . Essential hypertension 04/14/2007    Priority: Medium  . Osteopenia 04/14/2007    Priority: Medium  . Allergic rhinitis 09/22/2015    Priority: Low  . Adenomatous colon polyp     Priority: Low  . Herpes simplex virus (HSV) infection 01/16/2010    Priority: Low  . Genetic testing 02/10/2018  . Family history of breast cancer     Medications- reviewed and updated Current Outpatient Medications  Medication Sig Dispense Refill  . Albuterol Sulfate (PROAIR RESPICLICK) 350 (90 Base) MCG/ACT AEPB Inhale 1 puff into the lungs every 6 (six) hours as needed (wheezing or shortness of breath). 1 each 3  . aspirin 81 MG tablet Take 81 mg by mouth daily.      . cetirizine (ZYRTEC) 10 MG tablet Take 10 mg by mouth daily.    Marland Kitchen desvenlafaxine (PRISTIQ) 100 MG 24 hr tablet TAKE 1 TABLET BY MOUTH ONCE DAILY 30 tablet 5  . DEXILANT 60 MG capsule TAKE 1 CAPSULE BY MOUTH ONCE DAILY 90 capsule 1  . fluticasone (FLONASE) 50 MCG/ACT nasal  spray Place 2 sprays into both nostrils daily. 16 g 1  . gabapentin (NEURONTIN) 100 MG capsule TAKE 1 CAPSULE BY MOUTH AT BEDTIME 90 capsule 1  . hydrochlorothiazide (HYDRODIURIL) 12.5 MG tablet Take 1 tablet (12.5 mg total) by mouth daily. 30 tablet 5  . LORazepam (ATIVAN) 0.5 MG tablet Take 1 tablet (0.5 mg total) by mouth 2 (two) times daily as needed for anxiety (dont drive 8 hours after using). 25 tablet 0  . metoCLOPramide (REGLAN) 5 MG tablet Take 1 tablet (5 mg total) by mouth at bedtime. 28 tablet 0  . Multiple Vitamins-Minerals (MULTIVITAMIN ADULT PO) Take 1 tablet by mouth daily.     . nitroGLYCERIN (NITROSTAT) 0.4 MG SL tablet Place 0.4 mg under the tongue every 5 (five) minutes as needed for chest pain.    Marland Kitchen propranolol (INDERAL) 20 MG tablet TAKE 1 TABLET BY MOUTH TWICE DAILY 60 tablet 5  . rosuvastatin (CRESTOR) 20 MG tablet TAKE 1 TABLET BY MOUTH DAILY 90 tablet 3  . sertraline (ZOLOFT) 50 MG tablet TAKE 1 TABLET BY MOUTH ONCE DAILY 90 tablet 1  . SPIRIVA HANDIHALER 18 MCG inhalation capsule INHALE 1 PUFF BY MOUTH ONCE DAILY 90 capsule 1  . SYMBICORT 160-4.5 MCG/ACT inhaler INHALE TWO PUFFS BY MOUTH TWICE DAILY 10.2 g 11   No  current facility-administered medications for this visit.     Objective: BP 128/82 (BP Location: Left Arm, Patient Position: Sitting, Cuff Size: Normal)   Pulse 63   Temp 98.5 F (36.9 C) (Oral)   Ht 5\' 2"  (1.575 m)   Wt 156 lb 9.6 oz (71 kg)   SpO2 97%   BMI 28.64 kg/m  Gen: NAD, resting comfortably CV: RRR  Lungs: nonlabored, normal respiratory rate Abdomen: soft/nondistended Ext: no edema Skin: warm, dry   Back - Normal skin, Spine with normal alignment and no deformity.  No tenderness to vertebral process palpation.  Paraspinous muscles are not tender and without spasm.   Range of motion is full at neck and lumbar sacral regions. Negative Straight leg raise.   Normal ROM in hips and good strength. Significant pain over greater trochanteric  bursa with palpation Neuro- no saddle anesthesia, 5/5 strength lower extremities  Assessment/Plan:  Lateral pain of right hip S: right hip/lateral hip pain. Feels like a bruise but nothing there. Has pain down into her ankle/foot on the right side. Used some heat last ngiht which helped some. Mild groin pain noted but not as bad as lateral portion. No incontinence fecal or urinary. No saddle anesthesia. No fall or injury.   Started 3-4 days ago in right hip- had been bothered on left prior to that before it shifted. Took some advil and helped some. Without advil 7/10- down to 5/10.  A/P:  Greater trochanteric bursitis as most likely diagnosis. With pain all the way down to foot also could have bulging disc- prednisone should hopefully help with both. Could use nsaids but less ideal with CAD history and copd.  from avs "Your greater trochanteric bursa is inflamed. I am going to try prednisone for 7 days. Also seems like you have some nerve irritation running down into the leg (likely from the back). Prednisone may help with that as well. If you are not doing better in 10 days please let me know as I want to get you in with Dr. Paulla Fore our sports medicine doctor. "   Future Appointments  Date Time Provider [place]  03/26/2018  3:00 PM [facility]  05/21/2018  9:15 AM Liliane Shi, PA-C [facility]    Meds ordered this encounter  Medications  . predniSONE (DELTASONE) 20 MG tablet    Sig: Take 2 pills for 3 days, 1 pill for 4 days    Dispense:  10 tablet    Refill:  0    Return precautions advised.  Garret Reddish, MD

## 2018-03-10 NOTE — Patient Instructions (Addendum)
Health Maintenance Due  Topic Date Due  . Please check with your pharmacy to see if they have the shingrix vaccine. If they do- please get this immunization and update [place] by phone call or mychart with dates you receive the vaccine   . I would also like for you to sign up for an annual wellness visit with one of our nurses, Cassie or Manuela Schwartz, who both specialize in the annual wellness visit. This is a free benefit under medicare that may help [place] find additional ways to help you. Some highlights are reviewing medications, lifestyle, and doing a dementia screen.   Marland Kitchen MAMMOGRAM - Patient stated she will schedule her an appointment to get this done. Please please please call to get this done.  12/06/2017   Your greater trochanteric bursa is inflamed. I am going to try prednisone for 7 days. Also seems like you have some nerve irritation running down into the leg (likely from the back). Prednisone may help with that as well. If you are not doing better in 10 days please let me know as I want to get you in with Dr. Paulla Fore our sports medicine doctor. If you have worsening symptoms prior to that or new symptoms please let us know      ________________________________________________________

## 2018-03-12 ENCOUNTER — Ambulatory Visit (INDEPENDENT_AMBULATORY_CARE_PROVIDER_SITE_OTHER): Payer: PPO | Admitting: Family Medicine

## 2018-03-12 ENCOUNTER — Encounter: Payer: Self-pay | Admitting: Family Medicine

## 2018-03-12 VITALS — BP 146/86 | HR 64 | Temp 98.4°F | Ht 62.0 in | Wt 158.0 lb

## 2018-03-12 DIAGNOSIS — G5701 Lesion of sciatic nerve, right lower limb: Secondary | ICD-10-CM

## 2018-03-12 DIAGNOSIS — M79604 Pain in right leg: Secondary | ICD-10-CM

## 2018-03-12 MED ORDER — DICLOFENAC SODIUM 75 MG PO TBEC: 75.0000 mg | DELAYED_RELEASE_TABLET | Freq: Two times a day (BID) | ORAL | 0 refills | Status: DC

## 2018-03-12 MED ORDER — GABAPENTIN 100 MG PO CAPS: 300.0000 mg | ORAL_CAPSULE | Freq: Every day | ORAL | 1 refills | Status: DC

## 2018-03-12 MED ORDER — METHYLPREDNISOLONE ACETATE 80 MG/ML IJ SUSP
80.0000 mg | Freq: Once | INTRAMUSCULAR | Status: AC
  Administered 2018-03-12: 80 mg via INTRAMUSCULAR

## 2018-03-12 MED ORDER — KETOROLAC TROMETHAMINE 60 MG/2ML IM SOLN
60.0000 mg | Freq: Once | INTRAMUSCULAR | Status: AC
Start: 2018-03-12 — End: 2018-03-12
  Administered 2018-03-12: 60 mg via INTRAMUSCULAR

## 2018-03-12 NOTE — Patient Instructions (Addendum)
It was very nice to see you today!  I think you may have irritation in your sciatic nerve due to muscle inflammation.  We will give you 2 injections today.  One is an anti-inflammatory and one is a steroid.  Please increase your gabapentin 300 mg nightly.  Please start the diclofenac tomorrow.  Please let me know or Dr Yong Channel know if your symptoms worsen or do not improve over the next few days.   Take care, Dr [name]

## 2018-03-12 NOTE — Progress Notes (Signed)
   Subjective:  Katherine Foley is a 67 y.o. female who presents today for same-day appointment with a chief complaint of right hip pain.   HPI:  Right Leg Pain, Acute problem She was seen 2 days ago by her PCP for the same problem. She was started on a prednisone taper, however symptoms have worsened over that time. No obvious alleviating or aggravating factors. No obvious precipitating events.  Pain radiates into her right knee into the bottom of her foot.  Patient has tingling sensation about her foot.  Is tried heat and ice which did not help.  ROS: Per HPI  Objective:  Physical Exam: BP (!) 146/86 (BP Location: Left Arm, Patient Position: Sitting, Cuff Size: Normal)   Pulse 64   Temp 98.4 F (36.9 C) (Oral)   Ht 5\' 2"  (1.575 m)   Wt 158 lb (71.7 kg)   SpO2 96%   BMI 28.90 kg/m   Gen: NAD, resting comfortably MSK: -Back: No deformities.  Nontender to palpation. -Right lower extremity: Tender to palpation along lateral gluteal area and greater trochanter.  Also tender to palpation along medial knee.  Strength 5 out of 5 with ankle dorsiflexion and plantar flexion, knee extension and flexion, and hip extension.  Strength 4 out of 5 with hip abduction.  Resisted hip abduction also reproduces patient's pain.  Assessment/Plan:  Piriformis syndrome/right leg pain Patient's pain likely secondary to piriformis syndrome.  She also has some greater trochanteric pain which could be a part of her overall picture of piriformis syndrome.  We will give 60 mg of Toradol and 80 mg of Depo-Medrol today via IM injection.  Instructed patient to increase her gabapentin to 300 mg nightly.  We will also start short course of diclofenac.  Discussed home exercise program.  Discussed reasons to return to care.  Consider sports medicine referral if symptoms worsen or do not improve.  Algis Greenhouse. [name], MD 03/12/2018 10:57 AM

## 2018-03-14 ENCOUNTER — Ambulatory Visit (INDEPENDENT_AMBULATORY_CARE_PROVIDER_SITE_OTHER): Payer: PPO

## 2018-03-14 ENCOUNTER — Ambulatory Visit (INDEPENDENT_AMBULATORY_CARE_PROVIDER_SITE_OTHER): Payer: PPO | Admitting: Sports Medicine

## 2018-03-14 ENCOUNTER — Encounter: Payer: Self-pay | Admitting: Sports Medicine

## 2018-03-14 ENCOUNTER — Ambulatory Visit: Payer: PPO | Admitting: Family Medicine

## 2018-03-14 VITALS — BP 146/84 | HR 65 | Ht 62.0 in | Wt 158.4 lb

## 2018-03-14 DIAGNOSIS — M479 Spondylosis, unspecified: Secondary | ICD-10-CM | POA: Diagnosis not present

## 2018-03-14 DIAGNOSIS — M5441 Lumbago with sciatica, right side: Secondary | ICD-10-CM

## 2018-03-14 DIAGNOSIS — M5410 Radiculopathy, site unspecified: Secondary | ICD-10-CM

## 2018-03-14 DIAGNOSIS — M5136 Other intervertebral disc degeneration, lumbar region: Secondary | ICD-10-CM | POA: Diagnosis not present

## 2018-03-14 MED ORDER — GABAPENTIN 300 MG PO CAPS: 300.0000 mg | ORAL_CAPSULE | Freq: Every day | ORAL | 2 refills | Status: DC

## 2018-03-14 MED ORDER — DIAZEPAM 5 MG PO TABS: 5.0000 mg | ORAL_TABLET | Freq: Three times a day (TID) | ORAL | 0 refills | Status: DC | PRN

## 2018-03-14 NOTE — Progress Notes (Signed)
Katherine Foley. Katherine Foley, [place] [facility] [place]  Katherine Foley - 67 y.o. female MRN [id number]  Date of birth: 09-30-1950  Visit Date: 03/14/2018  PCP: Marin Olp, MD   Referred by: Marin Olp, MD  Scribe(s) for today's visit: Wendy Poet, LAT, ATC  SUBJECTIVE:  Katherine Foley is here for New Patient (Initial Visit) (R hip and leg pain) .  Referred by: Dr. Dimas Chyle   Her R hip and leg pain symptoms INITIALLY: Began last week w/ no known MOI and has progressively worsened. Described as 7/10 throbbing pain at rest but sharp w/ movement, radiating to R LE from the R glute to the medial leg and foot Worsened with movement and prolonged sitting Improved with ice Additional associated symptoms include: numbness and tingling in her R lower leg and foot    At this time symptoms show no change compared to onset  She has been prescribed a prednisone taper and Voltaren this week.  She had a toradol and Depo 80 IM injection on 03/12/18 w/ Dr. [name].  She is also taking 359m of Gabapentin nightly.  She has also been using several topical rubs.  She has not had any recent lumbar x-rays but notes that she has scoliosis and has seen a chiropractor in the past.   REVIEW OF SYSTEMS: Reports night time disturbances. Reports fevers, chills, or night sweats.  Occasional chills and night sweats. Denies unexplained weight loss. Denies personal history of cancer. Denies changes in bowel or bladder habits. Denies recent unreported falls. Reports new or worsening dyspnea or wheezing.  Due to COPD Reports headaches or dizziness. Yes to headaches. Reports numbness, tingling or weakness  In the extremities - in the R LE Denies dizziness or presyncopal episodes Denies lower extremity edema    HISTORY:  Prior history reviewed and updated per electronic medical record.  Social History   Occupational History  . Occupation: SCopywriter, advertising [employer] Tobacco Use  . Smoking status: Former Smoker    Packs/day: 1.50    Years: 44.00    Pack years: 66.00    Types: Cigarettes    Last attempt to quit: 09/03/2005    Years since quitting: 12.7  . Smokeless tobacco: Never Used  Substance and Sexual Activity  . Alcohol use: Not Currently    Alcohol/week: 0.0 standard drinks  . Drug use: No  . Sexual activity: Not Currently   Social History   Social History Narrative   Prolonged engagement 17 years in 2017. 3 children. 6 grandkids. 2 greatgrandkids. Keeps greatgrandson on tuesdays- loves her so much.       Retired from [employer](parttime- Dec 12)   Faith is very important to her      Hobbies: reading, cross stich, sew. Wants to learn to knit.        Past Medical History:  Diagnosis Date  . Adenomatous colon polyp   . ANXIETY DEPRESSION 02/15/2009  . AORTIC STENOSIS 07/07/2010   a. echo 12/23/10: EF 60-65%, mod to severe AS with mean gradient 29 mmHg;  b. 04/2011 s/p bioprosthetic AVR.  .Marland KitchenBarrett's esophagus 04/14/2007  . Barrett's esophagus 2000  . CAD, NATIVE VESSEL 05/17/2010   a. NSTEMI 8/11 - BMS to RCA;  b. cath 4/12: dLAD 50%, RI 80% (PCI), AVCFX 30%, pRCA 30%, stent ok, 40-50, then 50% dist RCA;  c. s/p Promus DES to RI 12/25/10;  d. 04/2011 CABG x 1 (  LIMA->LAD) @ time of AVR  . Carotid stenosis 05/17/2010   a. dopplers 16/01: RICA 09-32%; LICA 3-55% (follow up due 11/12)  . Chronic bronchitis ([place])    "multiple times" (03/25/2013)  . 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 Exertional shortness of breath    "related to COPD problems" (03/25/2013)  . Family history of breast cancer    2 daughters; PALB2 mutation in family  . Genetic testing 02/10/2018   PALB2 analysis at [facility] - Negative for familial mutation  . GERD (gastroesophageal reflux disease)   . H/O hiatal hernia   . Hemorrhoid thrombosis    "had it lanced" (03/25/2013)  . Herpes simplex without mention of complication 7/32/2025  .  History of blood transfusion    'w/OHS" (03/25/2013)  . 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 HYPERTENSION 04/14/2007  . MIGRAINE HEADACHE 04/20/2008  . Myocardial infarction [facility]) 2011   "during catherization" (03/25/2013)  . OSTEOPENIA 04/14/2007  . Ovarian cyst   . Pneumonia    "multiple times" (03/25/2013)  . URINARY INCONTINENCE, STRESS, MILD 08/26/2007  . Urine, incontinence, stress female    Past Surgical History:  Procedure Laterality Date  . AORTIC VALVE REPLACEMENT  2012  . APPENDECTOMY    . BLEPHAROPLASTY Bilateral ~ 1998  . CARDIAC CATHETERIZATION N/A 07/06/2016   Procedure: Left Heart Cath and Cors/Grafts Angiography;  Surgeon: Sherren Mocha, MD;  Location: [facility];  Service: Cardiovascular;  Laterality: N/A;  . CHOLECYSTECTOMY N/A 03/25/2013   Procedure: LAPAROSCOPIC CHOLECYSTECTOMY WITH INTRAOPERATIVE CHOLANGIOGRAM;  Surgeon: Imogene Burn. Georgette Dover, MD;  Location: [facility];  Service: General;  Laterality: N/A;  . CORONARY ANGIOPLASTY WITH STENT PLACEMENT  2010   "I have 2 stents" (03/25/2013)  . NASAL SINUS SURGERY  1997; 1998   "cust in maxillary sinus; put a stent in then removed it" (03/25/2013)  . OVARIAN CYST REMOVAL Right 2011   "size of a soccer ball" (03/25/2013)  . thrombosed hemorrohid lanced    . TONSILLECTOMY AND ADENOIDECTOMY    . TOTAL ABDOMINAL HYSTERECTOMY     family history includes Asthma in her son; Breast cancer in her daughter; Breast cancer (age of onset: 35) in her daughter; COPD in her father; Cancer in her other; Cirrhosis in her sister; Coronary artery disease in her other; Heart disease in her father, maternal uncle, mother, and other; Rheum arthritis in her father; Stroke in her brother. There is no history of Colon cancer or Colon polyps.  DATA OBTAINED & REVIEWED:  No results for input(s): HGBA1C, LABURIC, CREATINE in the last 8760 hours. X-Rays 03/14/2018 of the lumbar spine show Extensive atherosclerotic changes with osseous demineralization  and degenerative disc disease diffusely.  No acute fracture dislocation. OBJECTIVE:  VS:  HT:_0  (157.5 cm)   WT:158 lb 6.4 oz (71.8 kg)  BMI:28.96    BP:(!) 146/84  HR:65bpm  TEMP: ( )  RESP:96 %   PHYSICAL EXAM: CONSTITUTIONAL: Well-developed, Well-nourished and Uncomfortable has a difficult time sitting still. PSYCHIATRIC: Alert & appropriately interactive. and Not depressed or anxious appearing. RESPIRATORY: No increased work of breathing and Trachea Midline EYES: Pupils are equal., EOM intact without nystagmus. and No scleral icterus.  VASCULAR EXAM: Warm and well perfused NEURO: unremarkable  MSK Exam: BACK Exam: Normal alignment & Contours Skin: No overlying erythema/ecchymosis  MOTOR TESTING: Manual muscle testing reveals 5 out of 5 strength of that she has difficulty with heel and toe walking due to pain. and Intact in all LE myotomes  RIGHT    LEFT Straight leg raise-------------------------: positive, moderate pain                         positive, mild pain Braggard Stretch Test------------------: positive, mild pain                         positive, mild pain Slump Sign--------------------------------: positive, moderate pain                         positive, mild pain Popliteal compression test------------: positive, mild pain                         normal, no pain Greater sciatic notch tenderness----: positive, moderate pain                         normal, no pain   REFLEXES Right Left  DTR - L3/4 -Patellar 2+ 2+  DTR - L5/S1 - Achilles 2+ 1+    ASSESSMENT   1. Acute right-sided low back pain with right-sided sciatica   2. Spondylosis   3. Radiculitis     PLAN:  Pertinent additional documentation may be included in corresponding procedure notes, imaging studies, problem based documentation and patient instructions.  Procedures:  . None  Medications:  Meds ordered this encounter  Medications  . DISCONTD: diazepam (VALIUM) 5 MG tablet     Sig: Take 1 tablet (5 mg total) by mouth every 8 (eight) hours as needed for anxiety or muscle spasms.    Dispense:  30 tablet    Refill:  0  . gabapentin (NEURONTIN) 300 MG capsule    Sig: Take 1 capsule (300 mg total) by mouth at bedtime.    Dispense:  90 capsule    Refill:  2  . diazepam (VALIUM) 5 MG tablet    Sig: Take 1 tablet (5 mg total) by mouth every 8 (eight) hours as needed for anxiety or muscle spasms.    Dispense:  30 tablet    Refill:  0   Discussion/Instructions: No problem-specific Assessment & Plan notes found for this encounter.  . Symptoms are most consistent with likely right-sided radiculitis.  She has positive neural tension signs.  . Symptomatic treatment with medications as above and appropriate conservative home measures.   . Discussed red flag symptoms that warrant earlier emergent evaluation and patient voices understanding. . Activity modifications and the importance of avoiding exacerbating activities (limiting pain to no more than a 4 / 10 during or following activity) recommended and discussed.  Follow-up:  . Return in about 1 week (around 03/21/2018).  . If any lack of improvement consider: . further diagnostic evaluation with Consideration of osteopathic manipulation, referral to physical therapy or further diagnostic testing with MRI of the lumbar spine for consideration of epidural steroid injection.     CMA/ATC served as Education administrator during this visit. History, Physical, and Plan performed by medical provider. Documentation and orders reviewed and attested to.      Gerda Diss, [facility] Physician

## 2018-03-17 ENCOUNTER — Other Ambulatory Visit: Payer: Self-pay | Admitting: Family Medicine

## 2018-03-18 ENCOUNTER — Encounter: Payer: Self-pay | Admitting: Sports Medicine

## 2018-03-18 ENCOUNTER — Ambulatory Visit (INDEPENDENT_AMBULATORY_CARE_PROVIDER_SITE_OTHER): Payer: PPO | Admitting: Sports Medicine

## 2018-03-18 VITALS — BP 130/86 | HR 63 | Ht 62.0 in | Wt 159.6 lb

## 2018-03-18 DIAGNOSIS — M9904 Segmental and somatic dysfunction of sacral region: Secondary | ICD-10-CM

## 2018-03-18 DIAGNOSIS — M5441 Lumbago with sciatica, right side: Secondary | ICD-10-CM | POA: Diagnosis not present

## 2018-03-18 DIAGNOSIS — M9903 Segmental and somatic dysfunction of lumbar region: Secondary | ICD-10-CM

## 2018-03-18 DIAGNOSIS — M479 Spondylosis, unspecified: Secondary | ICD-10-CM | POA: Diagnosis not present

## 2018-03-18 DIAGNOSIS — G5701 Lesion of sciatic nerve, right lower limb: Secondary | ICD-10-CM | POA: Diagnosis not present

## 2018-03-18 DIAGNOSIS — M9905 Segmental and somatic dysfunction of pelvic region: Secondary | ICD-10-CM | POA: Diagnosis not present

## 2018-03-18 DIAGNOSIS — M5410 Radiculopathy, site unspecified: Secondary | ICD-10-CM

## 2018-03-18 MED ORDER — TRAMADOL HCL 50 MG PO TABS: 50.0000 mg | ORAL_TABLET | Freq: Four times a day (QID) | ORAL | 0 refills | Status: AC | PRN

## 2018-03-18 NOTE — Progress Notes (Signed)
Katherine Foley. Katherine Foley, Katherine Foley at [place]  Katherine Foley - 67 y.o. female MRN [id number]  Date of birth: May 30, 1951  Visit Date: 03/18/2018  PCP: Katherine Olp, MD   Referred by: Katherine Olp, MD  Scribe(s) for today's visit: Katherine Foley, CMA  SUBJECTIVE:  Katherine Foley is here for Follow-up (R-sided LBP)   03/14/2018: Her R hip and leg pain symptoms INITIALLY: Began last week w/ no known MOI and has progressively worsened. Described as 7/10 throbbing pain at rest but sharp w/ movement, radiating to R LE from the R glute to the medial leg and foot Worsened with movement and prolonged sitting Improved with ice Additional associated symptoms include: numbness and tingling in her R lower leg and foot   At this time symptoms show no change compared to onset   topical rubs. She has not had any recent lumbar x-rays but notes that she has scoliosis and has seen a chiropractor in the past.  03/18/2018: Compared to the last office visit, her previously described symptoms are worsening Current symptoms are severe (7/10-10/10) & are radiating to the R gluteal region and into the R leg.  She has been taking Valium and Gabapentin with minimal relief, she also feels that the medication made her feel "loopy". She has been taking Voltaren with minimal relief. She has tried Prednisone taper with minimal relief. She has been doing HEP, using heat and ice with minimal relief. She tried soaking in a warm Epsom Salt bath with short term relief.    REVIEW OF SYSTEMS: Reports night time disturbances. Reports chills, or night sweats. Denies unexplained weight loss. Denies personal history of cancer. Denies changes in bowel or bladder habits. Denies recent unreported falls. Denies new or worsening dyspnea or wheezing. Reports headaches.  Reports numbness, tingling or weakness in RLE.  Denies dizziness or presyncopal episodes Denies  lower extremity edema    HISTORY & PERTINENT PRIOR DATA:  Prior History reviewed and updated per electronic medical record.  Significant/pertinent history, findings, studies include:  reports that she quit smoking about 12 years ago. Her smoking use included cigarettes. She has a 66.00 pack-year smoking history. She has never used smokeless tobacco. No results for input(s): HGBA1C, LABURIC, CREATINE in the last 8760 hours. No specialty comments available. No problems updated.  OBJECTIVE:  VS:  HT:5\' 2"  (157.5 cm)   WT:159 lb 9.6 oz (72.4 kg)  BMI:29.18    BP:130/86  HR:63bpm  TEMP: ( )  RESP:96 %   PHYSICAL EXAM: Constitutional: WDWN, Non-toxic appearing. Psychiatric: Alert & appropriately interactive.  Not depressed or anxious appearing. Respiratory: No increased work of breathing.  Trachea Midline Eyes: Pupils are equal.  EOM intact without nystagmus.  No scleral icterus  Vascular Exam: warm to touch no edema  lower extremity neuro exam: unremarkable normal strength reduced sensation in the L5 distribution on the right without associated weakness.  MSK Exam: Positive braggart stretch test but negative straight leg raise.  Good internal and external rotation of bilateral hips with a trigger point over the piriformis muscle and gluteal musculature on the right, normal on the left.  Otherwise per osteopathic exam findings.  Lower extremity strength is 5 out of 5 in all myotomes.  Significant anterior pelvic tilt.   ASSESSMENT & PLAN:   1. Acute right-sided low back pain with right-sided sciatica   2. Spondylosis   3. Radiculitis   4. Piriformis syndrome of right side  5. Somatic dysfunction of pelvis region   6. Somatic dysfunction of sacral region   7. Somatic dysfunction of lumbar region     PLAN: Osteopathic manipulation was performed today based on physical exam findings.  Please see procedure note for further information including Osteopathic Exam  findings  Short course of tramadol provided given persistent ongoing pain.  Continue with Valium as needed at nighttime and recommend half dose of this given the sedation she has had.  Physical therapy for consideration of dry needling and home therapeutic exercises as there is no red flag symptoms on her physical exam or per history.  If any lack of improvement can consider further diagnostic evaluation with MRI  Follow-up: Return in about 2 weeks (around 04/01/2018) for consideration of repeat Osteopathic Manipulation.      Please see additional documentation for Objective, Assessment and Plan sections. Pertinent additional documentation may be included in corresponding procedure notes, imaging studies, problem based documentation and patient instructions. Please see these sections of the encounter for additional information regarding this visit.  CMA/ATC served as Education administrator during this visit. History, Physical, and Plan performed by medical provider. Documentation and orders reviewed and attested to.      Katherine Foley, [facility] Physician

## 2018-03-18 NOTE — Progress Notes (Signed)
PROCEDURE NOTE : OSTEOPATHIC MANIPULATION The decision today to treat with Osteopathic Manipulative Therapy (OMT) was based on physical exam findings. Verbal consent was obtained following a discussion with the patient regarding the of risks, benefits and potential side effects, including an acute pain flare,post manipulation soreness and need for repeat treatments.     NONE  Manipulation was performed as below: Regions treated: Lumbar spine, Pelvis and Sacrum OMT Techniques Used: HVLA, muscle energy, myofascial release, soft tissue and counterstrain  The patient tolerated the treatment well and reported Improved symptoms following treatment today. Patient was given medications, exercises, stretches and lifestyle modifications per AVS and verbally.   OSTEOPATHIC/STRUCTURAL EXAM:   Right anterior innominate Lumbosacral torsion L5 FRS right Right psoas contracture L5 lower pole tender point

## 2018-03-19 ENCOUNTER — Other Ambulatory Visit: Payer: Self-pay | Admitting: Family Medicine

## 2018-03-21 ENCOUNTER — Ambulatory Visit: Payer: PPO | Admitting: Sports Medicine

## 2018-03-26 ENCOUNTER — Ambulatory Visit: Payer: PPO

## 2018-03-27 ENCOUNTER — Ambulatory Visit: Payer: PPO | Admitting: Physical Therapy

## 2018-03-31 ENCOUNTER — Other Ambulatory Visit: Payer: Self-pay | Admitting: Family Medicine

## 2018-03-31 ENCOUNTER — Ambulatory Visit: Payer: PPO | Admitting: Sports Medicine

## 2018-04-02 DIAGNOSIS — M5441 Lumbago with sciatica, right side: Secondary | ICD-10-CM | POA: Diagnosis not present

## 2018-04-04 ENCOUNTER — Other Ambulatory Visit: Payer: Self-pay | Admitting: Family Medicine

## 2018-04-04 ENCOUNTER — Ambulatory Visit (INDEPENDENT_AMBULATORY_CARE_PROVIDER_SITE_OTHER): Payer: PPO

## 2018-04-04 VITALS — BP 136/76 | HR 63 | Ht 62.0 in | Wt 155.0 lb

## 2018-04-04 DIAGNOSIS — Z0000 Encounter for general adult medical examination without abnormal findings: Secondary | ICD-10-CM

## 2018-04-04 DIAGNOSIS — Z1239 Encounter for other screening for malignant neoplasm of breast: Secondary | ICD-10-CM

## 2018-04-04 DIAGNOSIS — Z1231 Encounter for screening mammogram for malignant neoplasm of breast: Secondary | ICD-10-CM

## 2018-04-04 NOTE — Progress Notes (Signed)
I have personally reviewed the Medicare Annual Wellness Visit and agree with the assessment and plan.  Algis Greenhouse. [name], MD 04/04/2018 3:35 PM

## 2018-04-04 NOTE — Progress Notes (Signed)
PCP notes:From OV dated 01/03/18:  Depression S: PHQ9 of 24 last visit and down to 19 again. She remains on pristiq 173m 24 hour as well as zoloft 542m She continues to deal with losing her daughter ANJanace Hoardho has 6-12 months to live from metastatic breast cancer. Does have PALB2 gene and she may be able to get into clinical trial.   We did do a short course of ativan at last visit as well #25. Avoiding buspar to reduce risk of serotonin syndrome. 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  Patient is considering hospice counseling-I encouraged her to follow through with this or to follow-up with 1 of our counselors.  She does not want to pursue psychiatry at this time.  Offered follow-up in 2 weeks to 2 months range- she wants to be 2 months but may postpone that further if needed    Health maintenance: Mammogram Needed, order placed, Encouraged patient to [facility] and schedule    Abnormal screenings: None   Patient concerns: Patient is concerned about her hip pain. She is scheduled for an MRI next Thursday.    Nurse concerns: PHQ-9 is a 21. Patient is currently caring for daughter who is terminal with cancer. She is taking daughter to chemo twice a week.   Next PCP appt: Follow Up as Needed

## 2018-04-04 NOTE — Progress Notes (Signed)
Subjective:   Katherine Foley is a 67 y.o. female who presents for Medicare Annual (Subsequent) preventive examination.  Review of Systems:  No ROS.  Medicare Wellness Visit. Additional risk factors are reflected in the social history. Cardiac Risk Factors include: advanced age (>38mn, >>21women) Patient currently lives in a 2 story home with boyfriend BMikki Santee Patient has a JFransisco Beaunamed Maggie. Patient enjoys reading and sewing. 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 Active with her grandchildren and great grandchildren. She is active with her church bible study group.  Patient is driving daughter AJanace Hoardto chemotherapy twice a week. She is doing radiation everyday.    Patient takes melatonin to help her sleep. Patient has been waking up at night with hip pain. Her sleep also depends on how her daughter is doing. She occasionally takes the Ativan to help her sleep. She also has Valium and Tramadol for her hip and they make her sleepy.  Objective:     Vitals: BP 136/76 (BP Location: Left Arm, Patient Position: Sitting, Cuff Size: Large)   Pulse 63   Ht 5' 2" (1.575 m)   Wt 155 lb (70.3 kg)   SpO2 98%   BMI 28.35 kg/m   Body mass index is 28.35 kg/m.  Advanced Directives 04/04/2018 10/14/2017 03/29/2017 07/06/2016 05/31/2015 05/31/2015 03/25/2013  Does Patient Have a Medical Advance Directive? _0  No Patient does not have advance directive;Patient would like information  Would patient like information on creating a medical advance directive? (No Data) No - Patient declined Yes (MAU/Ambulatory/Procedural Areas - Information given) Yes - Educational materials given - - Advance directive packet given    Tobacco Social History   Tobacco Use  Smoking Status Former Smoker  . Packs/day: 1.50  . Years: 44.00  . Pack years: 66.00  . Types: Cigarettes  . Last attempt to quit: 09/03/2005  . Years since quitting: 12.5  Smokeless Tobacco Never Used    Counseling given: Not Answered   Past Medical History:  Diagnosis Date  . Adenomatous colon polyp   . ANXIETY DEPRESSION 02/15/2009  . AORTIC STENOSIS 07/07/2010   a. echo 12/23/10: EF 60-65%, mod to severe AS with mean gradient 29 mmHg;  b. 04/2011 s/p bioprosthetic AVR.  .Marland KitchenBarrett's esophagus 04/14/2007  . Barrett's esophagus 2000  . CAD, NATIVE VESSEL 05/17/2010   a. NSTEMI 8/11 - BMS to RCA;  b. cath 4/12: dLAD 50%, RI 80% (PCI), AVCFX 30%, pRCA 30%, stent ok, 40-50, then 50% dist RCA;  c. s/p Promus DES to RI 12/25/10;  d. 04/2011 CABG x 1 (LIMA->LAD) @ time of AVR  . Carotid stenosis 05/17/2010   a. dopplers 116/10 RICA 496-04% LICA 05-40%(follow up due 11/12)  . Chronic bronchitis ([place]    "multiple times" (03/25/2013)  . 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 Exertional shortness of breath    "related to COPD problems" (03/25/2013)  . Family history of breast cancer    2 daughters; PALB2 mutation in family  . Genetic testing 02/10/2018   PALB2 analysis at [facility] - Negative for familial mutation  . GERD (gastroesophageal reflux disease)   . H/O hiatal hernia   . Hemorrhoid thrombosis    "had it lanced" (03/25/2013)  . Herpes simplex without mention of complication 59/81/1914 . History of blood transfusion    'w/OHS" (03/25/2013)  . [place] 08/29/2009  . HYPERTENSION 04/14/2007  . MIGRAINE HEADACHE 04/20/2008  . Myocardial  infarction (HCC) 2011   "during catherization" (03/25/2013)  . OSTEOPENIA 04/14/2007  . Ovarian cyst   . Pneumonia    "multiple times" (03/25/2013)  . URINARY INCONTINENCE, STRESS, MILD 08/26/2007  . Urine, incontinence, stress female    Past Surgical History:  Procedure Laterality Date  . AORTIC VALVE REPLACEMENT  2012  . APPENDECTOMY    . BLEPHAROPLASTY Bilateral ~ 1998  . CARDIAC CATHETERIZATION N/A 07/06/2016   Procedure: Left Heart Cath and Cors/Grafts Angiography;  Surgeon: Michael Cooper, MD;  Location: [facility];  Service: Cardiovascular;  Laterality: N/A;  . CHOLECYSTECTOMY N/A 03/25/2013   Procedure: LAPAROSCOPIC CHOLECYSTECTOMY WITH INTRAOPERATIVE CHOLANGIOGRAM;  Surgeon: Matthew K. Tsuei, MD;  Location: [facility] OR;  Service: General;  Laterality: N/A;  . CORONARY ANGIOPLASTY WITH STENT PLACEMENT  2010   "I have 2 stents" (03/25/2013)  . NASAL SINUS SURGERY  1997; 1998   "cust in maxillary sinus; put a stent in then removed it" (03/25/2013)  . OVARIAN CYST REMOVAL Right 2011   "size of a soccer ball" (03/25/2013)  . thrombosed hemorrohid lanced    . TONSILLECTOMY AND ADENOIDECTOMY    . TOTAL ABDOMINAL HYSTERECTOMY     Family History  Problem Relation Age of Onset  . Coronary artery disease Other   . Heart disease Other        6 stents  . Heart disease Mother   . COPD Father        smoked  . Heart disease Father   . Rheum arthritis Father   . Heart disease Maternal Uncle   . Breast cancer Daughter        currently 48  . Asthma Son        as a child   . Breast cancer Daughter 50       currently 50; PALB2 mutation  . Cancer Other        very distant paternal cousin; pancreatic ca  . Cirrhosis Sister   . Stroke Brother   . Colon cancer Neg Hx   . Colon polyps Neg Hx    Social History   Socioeconomic History  . Marital status: Single    Spouse name: Not on file  . Number of children: 3  . Years of education: Not on file  . Highest education level: Not on file  Occupational History  . Occupation: Stocker    Employer: [employer]  Social Needs  . Financial resource strain: Not on file  . Food insecurity:    Worry: Not on file    Inability: Not on file  . Transportation needs:    Medical: Not on file    Non-medical: Not on file  Tobacco Use  . Smoking status: Former Smoker    Packs/day: 1.50    Years: 44.00    Pack years: 66.00    Types: Cigarettes    Last attempt to quit: 09/03/2005    Years since quitting: 12.5  . Smokeless tobacco: Never Used  Substance and Sexual Activity   . Alcohol use: Not Currently    Alcohol/week: 0.0 oz  . Drug use: No  . Sexual activity: Not Currently  Lifestyle  . Physical activity:    Days per week: Not on file    Minutes per session: Not on file  . Stress: Not on file  Relationships  . Social connections:    Talks on phone: Not on file    Gets together: Not on file    Attends religious service: Not on   file    Active member of club or organization: Not on file    Attends meetings of clubs or organizations: Not on file    Relationship status: Not on file  Other Topics Concern  . Not on file  Social History Narrative   Prolonged engagement 17 years in 2017. 3 children. 6 grandkids. 2 greatgrandkids. Keeps greatgrandson on tuesdays- loves her so much.       Retired from [employer] (parttime- Dec 12)   Faith is very important to her      Hobbies: reading, cross stich, sew. Wants to learn to knit.        Outpatient Encounter Medications as of 04/04/2018  Medication Sig  . Albuterol Sulfate (PROAIR RESPICLICK) 767 (90 Base) MCG/ACT AEPB Inhale 1 puff into the lungs every 6 (six) hours as needed (wheezing or shortness of breath).  Marland Kitchen aspirin 81 MG tablet Take 81 mg by mouth daily.    . cetirizine (ZYRTEC) 10 MG tablet Take 10 mg by mouth daily.  Marland Kitchen desvenlafaxine (PRISTIQ) 100 MG 24 hr tablet TAKE 1 TABLET BY MOUTH ONCE DAILY  . DEXILANT 60 MG capsule TAKE 1 CAPSULE BY MOUTH ONCE DAILY  . diazepam (VALIUM) 5 MG tablet Take 1 tablet (5 mg total) by mouth every 8 (eight) hours as needed for anxiety or muscle spasms.  . diclofenac (VOLTAREN) 75 MG EC tablet Take 1 tablet (75 mg total) by mouth 2 (two) times daily.  . fluticasone (FLONASE) 50 MCG/ACT nasal spray Place 2 sprays into both nostrils daily.  Marland Kitchen gabapentin (NEURONTIN) 100 MG capsule Take 3 capsules (300 mg total) by mouth at bedtime. (Patient taking differently: Take 100 mg by mouth daily as needed. )  . gabapentin (NEURONTIN) 300 MG capsule Take 1 capsule (300 mg total) by  mouth at bedtime.  . hydrochlorothiazide (HYDRODIURIL) 12.5 MG tablet TAKE 1 TABLET BY MOUTH ONCE DAILY  . LORazepam (ATIVAN) 0.5 MG tablet Take 1 tablet (0.5 mg total) by mouth 2 (two) times daily as needed for anxiety (dont drive 8 hours after using).  . metoCLOPramide (REGLAN) 5 MG tablet Take 1 tablet (5 mg total) by mouth at bedtime.  . Multiple Vitamins-Minerals (MULTIVITAMIN ADULT PO) Take 1 tablet by mouth daily.   . nitroGLYCERIN (NITROSTAT) 0.4 MG SL tablet Place 0.4 mg under the tongue every 5 (five) minutes as needed for chest pain.  Marland Kitchen propranolol (INDERAL) 20 MG tablet TAKE 1 TABLET BY MOUTH TWICE DAILY  . rosuvastatin (CRESTOR) 20 MG tablet TAKE 1 TABLET BY MOUTH DAILY  . sertraline (ZOLOFT) 50 MG tablet TAKE 1 TABLET BY MOUTH ONCE DAILY  . SPIRIVA HANDIHALER 18 MCG inhalation capsule INHALE 1 PUFF BY MOUTH ONCE DAILY  . SYMBICORT 160-4.5 MCG/ACT inhaler INHALE 2 PUFFS BY MOUTH TWICE DAILY  . [DISCONTINUED] Aclidinium Bromide (TUDORZA PRESSAIR) 400 MCG/ACT AEPB Inhale 1 puff into the lungs 2 (two) times daily.   No facility-administered encounter medications on file as of 04/04/2018.     Activities of Daily Living In your present state of health, do you have any difficulty performing the following activities: 04/04/2018  Hearing? N  Vision? N  Difficulty concentrating or making decisions? N  Walking or climbing stairs? N  Dressing or bathing? N  Doing errands, shopping? N  Preparing Food and eating ? N  Using the Toilet? N  In the past six months, have you accidently leaked urine? N  Do you have problems with loss of bowel control? N  Managing your Medications?  N  Managing your Finances? N  Housekeeping or managing your Housekeeping? N  Some recent data might be hidden    Patient Care Team: Marin Olp, MD as PCP - General (Family Medicine) Bensimhon, Shaune Pascal, MD (Cardiology) Sherren Mocha, MD (Cardiology) Tanda Rockers, MD as Consulting Physician (Pulmonary  Disease) Jamesetta Geralds, [place] (Chiropractic Medicine)    Assessment:   This is a routine wellness examination for Geraldine.  Exercise Activities and Dietary recommendations Current Exercise Habits: The patient does not participate in regular exercise at present, Exercise limited by: orthopedic condition(s)   Breakfast:Oatmeal with cinnamon, raisins, and walnuts or Wheaties with bananas and walnuts, Black coffee (decaffinated) 2 cups  Lunch: Starbucks iced coffee with cafe mocha, graham crackers with peanut butter, cantaloupe or watermelon  Dinner: BLT with [place] bacon, iced water  Drinks about 8 cups of water a day  Goals    . Weight (lb) < 145 lb (65.8 kg)     Lose weight by watching caloric intake.        Fall Risk Fall Risk  04/04/2018 03/29/2017 03/22/2016 06/24/2015 06/14/2014  Falls in the past year? Yes Yes No No No  Number falls in past yr: 1 2 or more - - -  Injury with Fall? No No - - -  Follow up - Falls prevention discussed - - -    Depression Screen PHQ 2/9 Scores 04/04/2018 03/10/2018 01/03/2018 12/09/2017  PHQ - 2 Score _0 PHQ- 9 Score _1 Cognitive Function     6CIT Screen 04/04/2018  What Year? 0 points  What month? 0 points  What time? 0 points  Count back from 20 0 points  Months in reverse 0 points    Immunization History  Administered Date(s) Administered  . Influenza Split 06/19/2011, 07/02/2012  . Influenza Whole 08/03/2009  . Influenza, High Dose Seasonal PF 05/20/2015  . Influenza,inj,Quad PF,6+ Mos 05/21/2013, 05/14/2014  . Influenza-Unspecified 08/17/2016, 06/19/2017  . Pneumococcal Conjugate-13 06/14/2014  . Pneumococcal Polysaccharide-23 03/22/2016  . Td 08/29/2009  . Zoster 01/21/2016  . Zoster Recombinat (Shingrix) 03/10/2018      Screening Tests Health Maintenance  Topic Date Due  . MAMMOGRAM  12/06/2017  . INFLUENZA VACCINE  04/03/2018  . TETANUS/TDAP  08/30/2019  . COLONOSCOPY  07/14/2024  . DEXA SCAN  Completed    . Hepatitis C Screening  Completed  . PNA vac Low Risk Adult  Completed        Plan:    Follow Up with PCP as advised   I have personally reviewed and noted the following in the patient's chart:   . Medical and social history . Use of alcohol, tobacco or illicit drugs  . Current medications and supplements . Functional ability and status . Nutritional status . Physical activity . Advanced directives . List of other physicians . Vitals . Screenings to include cognitive, depression, and falls . Referrals and appointments  In addition, I have reviewed and discussed with patient certain preventive protocols, quality metrics, and best practice recommendations. A written personalized care plan for preventive services as well as general preventive health recommendations were provided to patient.     [place], [place]  02/04/1582

## 2018-04-04 NOTE — Patient Instructions (Addendum)
Katherine Foley , Thank you for taking time to come for your Medicare Wellness Visit. I appreciate your ongoing commitment to your health goals. Please review the following plan we discussed and let me know if I can assist you in the future.   These are the goals we discussed: Goals    . Weight (lb) < 145 lb (65.8 kg)     Lose weight by watching caloric intake.        This is a list of the screening recommended for you and due dates:  Health Maintenance  Topic Date Due  . Mammogram  12/06/2017  . Flu Shot  04/03/2018  . Tetanus Vaccine  08/30/2019  . Colon Cancer Screening  07/14/2024  . DEXA scan (bone density measurement)  Completed  .  Hepatitis C: One time screening is recommended by Center for Disease Control  (CDC) for  adults born from [birth year] through 1967.   Completed  . Pneumonia vaccines  Completed   Preventive Care for Adults  A healthy lifestyle and preventive care can promote health and wellness. Preventive health guidelines for adults include the following key practices.  . A routine yearly physical is a good way to check with your health care provider about your health and preventive screening. It is a chance to share any concerns and updates on your health and to receive a thorough exam.  . Visit your dentist for a routine exam and preventive care every 6 months. Brush your teeth twice a day and floss once a day. Good oral hygiene prevents tooth decay and gum disease.  . The frequency of eye exams is based on your age, health, family medical history, use  of contact lenses, and other factors. Follow your health care provider's recommendations for frequency of eye exams.  . Eat a healthy diet. Foods like vegetables, fruits, whole grains, low-fat dairy products, and lean protein foods contain the nutrients you need without too many calories. Decrease your intake of foods high in solid fats, added sugars, and salt. Eat the right amount of calories for you. Get information about  a proper diet from your health care provider, if necessary.  . Regular physical exercise is one of the most important things you can do for your health. Most adults should get at least 150 minutes of moderate-intensity exercise (any activity that increases your heart rate and causes you to sweat) each week. In addition, most adults need muscle-strengthening exercises on 2 or more days a week.  Silver Sneakers may be a benefit available to you. To determine eligibility, you may visit the website: www.silversneakers.com or contact program at 757-422-3962 Mon-Fri between 8AM-8PM.   . Maintain a healthy weight. The body mass index (BMI) is a screening tool to identify possible weight problems. It provides an estimate of body fat based on height and weight. Your health care provider can find your BMI and can help you achieve or maintain a healthy weight.   For adults 20 years and older: ? A BMI below 18.5 is considered underweight. ? A BMI of 18.5 to 24.9 is normal. ? A BMI of 25 to 29.9 is considered overweight. ? A BMI of 30 and above is considered obese.   . Maintain normal blood lipids and cholesterol levels by exercising and minimizing your intake of saturated fat. Eat a balanced diet with plenty of fruit and vegetables. Blood tests for lipids and cholesterol should begin at age 34 and be repeated every 5 years. If your lipid or  cholesterol levels are high, you are over 50, or you are at high risk for heart disease, you may need your cholesterol levels checked more frequently. Ongoing high lipid and cholesterol levels should be treated with medicines if diet and exercise are not working.  . If you smoke, find out from your health care provider how to quit. If you do not use tobacco, please do not start.  . If you choose to drink alcohol, please do not consume more than 2 drinks per day. One drink is considered to be 12 ounces (355 mL) of beer, 5 ounces (148 mL) of wine, or 1.5 ounces (44 mL) of  liquor.  . If you are 21-40 years old, ask your health care provider if you should take aspirin to prevent strokes.  . Use sunscreen. Apply sunscreen liberally and repeatedly throughout the day. You should seek shade when your shadow is shorter than you. Protect yourself by wearing long sleeves, pants, a wide-brimmed hat, and sunglasses year round, whenever you are outdoors.  . Once a month, do a whole body skin exam, using a mirror to look at the skin on your back. Tell your health care provider of new moles, moles that have irregular borders, moles that are larger than a pencil eraser, or moles that have changed in shape or color.

## 2018-04-07 DIAGNOSIS — M545 Low back pain: Secondary | ICD-10-CM | POA: Diagnosis not present

## 2018-04-09 DIAGNOSIS — M5441 Lumbago with sciatica, right side: Secondary | ICD-10-CM | POA: Diagnosis not present

## 2018-04-11 DIAGNOSIS — M533 Sacrococcygeal disorders, not elsewhere classified: Secondary | ICD-10-CM | POA: Diagnosis not present

## 2018-04-16 DIAGNOSIS — M533 Sacrococcygeal disorders, not elsewhere classified: Secondary | ICD-10-CM | POA: Diagnosis not present

## 2018-04-28 DIAGNOSIS — M533 Sacrococcygeal disorders, not elsewhere classified: Secondary | ICD-10-CM | POA: Diagnosis not present

## 2018-05-01 ENCOUNTER — Other Ambulatory Visit: Payer: Self-pay | Admitting: Family Medicine

## 2018-05-01 NOTE — Telephone Encounter (Signed)
Controlled substance: Lorazepam 0.5 mg  Pharmacy: [facility]  Status: Rx last filled 12/09/2017, # 25 tablets, 0 refill  New Findings : None   Additional Comments: Pt requesting refill

## 2018-05-19 ENCOUNTER — Other Ambulatory Visit: Payer: Self-pay | Admitting: Family Medicine

## 2018-05-19 ENCOUNTER — Encounter: Payer: Self-pay | Admitting: Sports Medicine

## 2018-05-20 NOTE — H&P (View-Only) (Signed)
Cardiology Office Note:    Date:  05/21/2018   ID:  Katherine Foley, Katherine Foley 1951-03-30, MRN [id number]  PCP:  Marin Olp, MD  Cardiologist:  Sherren Mocha, MD   Electrophysiologist:  None   Referring MD: Marin Olp, MD   Chief Complaint  Patient presents with  . Follow-up    CAD, hx of AVR  . Chest Pain     History of Present Illness:    Katherine Foley is a 67 y.o. female with coronary artery disease and aortic valve disease.  She had a NSTEMI in 2011 treated with PCI of the RCA.  She also underwent PCI of the RI in 2012.  She underwent a bioprosthetic AVR + CABG with a L-LAD in 2012.  She was last seen by Dr. Burt Knack in 05/2017.  She continued to note chronic chest and neck burning at that time.    Katherine Foley returns for follow-up on coronary artery disease and aortic valve disease.  She has been under a significant amount of stress.  Her sister, with whom she was very close, passed away in 08/12/17.  Her daughter was recently diagnosed with stage IV cancer.  She has felt that her most recent symptoms have been attributed to stress.  She does note chest pain, especially at night, that has lasted for hours.  This is somewhat similar to her previous angina.  She also continues to have throat pain.  She has chronic shortness of breath with exertion without significant change.  She denies orthopnea, lower extremity swelling or syncope.  Prior CV studies:   The following studies were reviewed today:  Echo 12/26/16 EF 65-70, no RWMA, Gr 1 DD, AVR ok with mean 11, trivial TR  Cardiac Catheterization 07/06/16 LM ok LAD mid 31 RI stent patent PCx prox 30 RCA mid stent patent with 30 ISR L-LAD atretic   Echo 12/02/15 EF 65-70, no RWMA, Gr 1 DD, normally functioning AVR (mean 16), trivial MR, trivial TR  Carotid US 12/02/15  1-39% bilateral ICA stenosis. FU as needed   Nuclear stress test 12/02/15 EF 70.  Normal pharmacologic nuclear stress test with no evidence of prior  infarct or ischemia.   Past Medical History:  Diagnosis Date  . Adenomatous colon polyp   . ANXIETY DEPRESSION 02/15/2009  . AORTIC STENOSIS 07/07/2010   a. echo 12/23/10: EF 60-65%, mod to severe AS with mean gradient 29 mmHg;  b. 04/2011 s/p bioprosthetic AVR.  Marland Kitchen Barrett's esophagus 04/14/2007  . Barrett's esophagus 2000  . CAD, NATIVE VESSEL 05/17/2010   a. NSTEMI 8/11 - BMS to RCA;  b. cath 4/12: dLAD 50%, RI 80% (PCI), AVCFX 30%, pRCA 30%, stent ok, 40-50, then 50% dist RCA;  c. s/p Promus DES to RI 12/25/10;  d. 04/2011 CABG x 1 (LIMA->LAD) @ time of AVR  . Carotid stenosis 05/17/2010   a. dopplers 85/02: RICA 77-41%; LICA 2-87% (follow up due 11/12)  . Chronic bronchitis ([place])    "multiple times" (03/25/2013)  . 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 Exertional shortness of breath    "related to COPD problems" (03/25/2013)  . Family history of breast cancer    2 daughters; PALB2 mutation in family  . Genetic testing 02/10/2018   PALB2 analysis at [facility] - Negative for familial mutation  . GERD (gastroesophageal reflux disease)   . H/O hiatal hernia   . Hemorrhoid thrombosis    "had it lanced" (03/25/2013)  . Herpes simplex without  mention of complication 02/27/9484  . History of blood transfusion    'w/OHS" (03/25/2013)  . Vall[place], MILD 08/29/2009  . HYPERTENSION 04/14/2007  . MIGRAINE HEADACHE 04/20/2008  . Myocardial infarction [facility]) 2011   "during catherization" (03/25/2013)  . OSTEOPENIA 04/14/2007  . Ovarian cyst   . Pneumonia    "multiple times" (03/25/2013)  . URINARY INCONTINENCE, STRESS, MILD 08/26/2007  . Urine, incontinence, stress female    Surgical Hx: The patient  has a past surgical history that includes Tonsillectomy and adenoidectomy; Nasal sinus surgery (1997; 1998); Blepharoplasty (Bilateral, ~ 1998); thrombosed hemorrohid lanced; Ovarian cyst removal (Right, 2011); Appendectomy; Total abdominal hysterectomy; Aortic valve replacement  (2012); Coronary angioplasty with stent (2010); Cholecystectomy (N/A, 03/25/2013); and Cardiac catheterization (N/A, 07/06/2016).   Current Medications: Current Meds  Medication Sig  . Albuterol Sulfate (PROAIR RESPICLICK) 462 (90 Base) MCG/ACT AEPB Inhale 1 puff into the lungs every 6 (six) hours as needed (wheezing or shortness of breath).  Marland Kitchen aspirin 81 MG tablet Take 81 mg by mouth daily.    . cetirizine (ZYRTEC) 10 MG tablet Take 10 mg by mouth daily.  Marland Kitchen desvenlafaxine (PRISTIQ) 100 MG 24 hr tablet TAKE 1 TABLET BY MOUTH ONCE DAILY  . DEXILANT 60 MG capsule TAKE 1 CAPSULE BY MOUTH ONCE DAILY  . diazepam (VALIUM) 5 MG tablet Take 1 tablet (5 mg total) by mouth every 8 (eight) hours as needed for anxiety or muscle spasms.  . diclofenac (VOLTAREN) 75 MG EC tablet Take 1 tablet (75 mg total) by mouth 2 (two) times daily.  . fluticasone (FLONASE) 50 MCG/ACT nasal spray Place 2 sprays into both nostrils daily.  Marland Kitchen gabapentin (NEURONTIN) 300 MG capsule Take 1 capsule (300 mg total) by mouth at bedtime.  . hydrochlorothiazide (HYDRODIURIL) 12.5 MG tablet TAKE 1 TABLET BY MOUTH ONCE DAILY  . LORazepam (ATIVAN) 0.5 MG tablet TAKE 1 TABLET BY MOUTH TWICE DAILY AS NEEDED FOR ANXIETY (DONT DRIVE 8 HOURS AFTER USING)  . Multiple Vitamins-Minerals (MULTIVITAMIN ADULT PO) Take 1 tablet by mouth daily.   . nitroGLYCERIN (NITROSTAT) 0.4 MG SL tablet Place 0.4 mg under the tongue every 5 (five) minutes as needed for chest pain.  Marland Kitchen propranolol (INDERAL) 20 MG tablet TAKE 1 TABLET BY MOUTH TWICE DAILY  . rosuvastatin (CRESTOR) 20 MG tablet TAKE 1 TABLET BY MOUTH DAILY  . sertraline (ZOLOFT) 50 MG tablet TAKE 1 TABLET BY MOUTH ONCE DAILY  . SPIRIVA HANDIHALER 18 MCG inhalation capsule INHALE 1 PUFF BY MOUTH ONCE DAILY  . SYMBICORT 160-4.5 MCG/ACT inhaler INHALE 2 PUFFS BY MOUTH TWICE DAILY  . [DISCONTINUED] Aclidinium Bromide (TUDORZA PRESSAIR) 400 MCG/ACT AEPB Inhale 1 puff into the lungs 2 (two) times daily.       Allergies:   Codeine sulfate and Hydrocodone-acetaminophen   Social History   Tobacco Use  . Smoking status: Former Smoker    Packs/day: 1.50    Years: 44.00    Pack years: 66.00    Types: Cigarettes    Last attempt to quit: 09/03/2005    Years since quitting: 12.7  . Smokeless tobacco: Never Used  Substance Use Topics  . Alcohol use: Not Currently    Alcohol/week: 0.0 standard drinks  . Drug use: No     Family Hx: The patient's family history includes Asthma in her son; Breast cancer in her daughter; Breast cancer (age of onset: 99) in her daughter; COPD in her father; Cancer in her other; Cirrhosis in her sister; Coronary artery disease in her  other; Heart disease in her father, maternal uncle, mother, and other; Rheum arthritis in her father; Stroke in her brother. There is no history of Colon cancer or Colon polyps.  ROS:   Please see the history of present illness.    Review of Systems  Hematologic/Lymphatic: Bruises/bleeds easily.  Psychiatric/Behavioral: Positive for depression. The patient is nervous/anxious.    All other systems reviewed and are negative.   EKGs/Labs/Other Test Reviewed:    EKG:  EKG is  ordered today.  The ekg ordered today demonstrates normal sinus rhythm, heart rate 59, normal axis, subtle T wave inversions V3-V5, QTC 413  Recent Labs: 05/21/2018: BUN 9; Creatinine, Ser 0.63; Hemoglobin 12.5; Platelets 222; Potassium 4.8; Sodium 132   Recent Lipid Panel Lab Results  Component Value Date/Time   CHOL 194 03/20/2017 08:22 AM   TRIG 138.0 03/20/2017 08:22 AM   HDL 73.70 03/20/2017 08:22 AM   CHOLHDL 3 03/20/2017 08:22 AM   LDLCALC 92 03/20/2017 08:22 AM   LDLDIRECT 65.0 06/19/2017 09:01 AM    Physical Exam:    VS:  BP 134/82   Pulse (!) 59   Ht 5' 2" (1.575 m)   Wt 157 lb 12.8 oz (71.6 kg)   BMI 28.86 kg/m     Wt Readings from Last 3 Encounters:  05/21/18 157 lb 12.8 oz (71.6 kg)  04/04/18 155 lb (70.3 kg)  03/18/18 159 lb 9.6 oz  (72.4 kg)     Physical Exam  Constitutional: She is oriented to person, place, and time. She appears well-developed and well-nourished. No distress.  HENT:  Head: Normocephalic and atraumatic.  Eyes: No scleral icterus.  Neck: No JVD present. No thyromegaly present.  Cardiovascular: Normal rate and regular rhythm.  Murmur heard.  Low-pitched systolic murmur is present with a grade of 1/6 at the upper right sternal border. Pulmonary/Chest: Effort normal. She has no rales.  Abdominal: Soft. She exhibits no distension.  Musculoskeletal: She exhibits no edema.  Lymphadenopathy:    She has no cervical adenopathy.  Neurological: She is alert and oriented to person, place, and time.  Skin: Skin is warm and dry.  Psychiatric: She has a normal mood and affect.    ASSESSMENT & PLAN:    Coronary artery disease involving native coronary artery of native heart with angina pectoris ([place]) History of MI and prior PCI of the RCA and ramus intermediate, subsequent CABG in 2012.  Cardiac catheterization 2017 demonstrated patent stents and no significant disease elsewhere.  Her LIMA-LAD was atretic.  She now presents with chest discomfort worrisome for progressive angina.  ECG does demonstrate subtle T wave inversions V3-V5.  I have recommended proceeding with cardiac catheterization to further evaluate.  I discussed this with Dr. Burt Knack who agreed.  Risks and benefits of cardiac catheterization have been discussed with the patient.  These include bleeding, infection, kidney damage, stroke, heart attack, death.  The patient understands these risks and is willing to proceed.   -Schedule cardiac catheterization in the next 2 weeks  -Labs today: BMET, CBC  -Continue aspirin, rosuvastatin, propranolol  S/P AVR (aortic valve replacement) Continue SBE prophylaxis.  Arrange follow-up echocardiogram.  Essential hypertension Blood pressure somewhat above target today.  However, she has been doing significant  amounts of stress recently.  I did consider whether to place her on isosorbide to control her angina.  However, she has a history of significant headaches with sublingual nitroglycerin.  At this point, continue current medications and continue to monitor blood pressure.  Hyperlipidemia LDL goal <70 LDL optimal on most recent lab work.  Continue current Rx.     Dispo:  Return in about 2 weeks (around 06/04/2018) for Post Procedure Follow Up, w/ Dr. Burt Knack, or Richardson Dopp, PA-C.   Medication Adjustments/Labs and Tests Ordered: Current medicines are reviewed at length with the patient today.  Concerns regarding medicines are outlined above.  Tests Ordered: Orders Placed This Encounter  Procedures  . Basic Metabolic Panel (BMET)  . CBC  . EKG 12-Lead  . ECHOCARDIOGRAM COMPLETE   Medication Changes: No orders of the defined types were placed in this encounter.   Signed, Richardson Dopp, PA-C  05/21/2018 5:47 PM    [facility] [place], [place], [place]  [address] Phone: [phone]; Fax: [phone]

## 2018-05-20 NOTE — Progress Notes (Signed)
Cardiology Office Note:    Date:  05/21/2018   ID:  Katherine Foley, DOB 07/26/1951, MRN [id number]  PCP:  Hunter, Stephen O, MD  Cardiologist:  Michael Cooper, MD   Electrophysiologist:  None   Referring MD: Hunter, Stephen O, MD   Chief Complaint  Patient presents with  . Follow-up    CAD, hx of AVR  . Chest Pain     History of Present Illness:    Katherine Foley is a 67 y.o. female with coronary artery disease and aortic valve disease.  She had a NSTEMI in 2011 treated with PCI of the RCA.  She also underwent PCI of the RI in 2012.  She underwent a bioprosthetic AVR + CABG with a L-LAD in 2012.  She was last seen by Dr. Cooper in 05/2017.  She continued to note chronic chest and neck burning at that time.    Katherine Foley returns for follow-up on coronary artery disease and aortic valve disease.  She has been under a significant amount of stress.  Her sister, with whom she was very close, passed away in December 2018.  Her daughter was recently diagnosed with stage IV cancer.  She has felt that her most recent symptoms have been attributed to stress.  She does note chest pain, especially at night, that has lasted for hours.  This is somewhat similar to her previous angina.  She also continues to have throat pain.  She has chronic shortness of breath with exertion without significant change.  She denies orthopnea, lower extremity swelling or syncope.  Prior CV studies:   The following studies were reviewed today:  Echo 12/26/16 EF 65-70, no RWMA, Gr 1 DD, AVR ok with mean 11, trivial TR  Cardiac Catheterization 07/06/16 LM ok LAD mid 30 RI stent patent PCx prox 30 RCA mid stent patent with 30 ISR L-LAD atretic   Echo 12/02/15 EF 65-70, no RWMA, Gr 1 DD, normally functioning AVR (mean 16), trivial MR, trivial TR  Carotid US 12/02/15  1-39% bilateral ICA stenosis. FU as needed   Nuclear stress test 12/02/15 EF 70.  Normal pharmacologic nuclear stress test with no evidence of prior  infarct or ischemia.   Past Medical History:  Diagnosis Date  . Adenomatous colon polyp   . ANXIETY DEPRESSION 02/15/2009  . AORTIC STENOSIS 07/07/2010   a. echo 12/23/10: EF 60-65%, mod to severe AS with mean gradient 29 mmHg;  b. 04/2011 s/p bioprosthetic AVR.  . Barrett's esophagus 04/14/2007  . Barrett's esophagus 2000  . CAD, NATIVE VESSEL 05/17/2010   a. NSTEMI 8/11 - BMS to RCA;  b. cath 4/12: dLAD 50%, RI 80% (PCI), AVCFX 30%, pRCA 30%, stent ok, 40-50, then 50% dist RCA;  c. s/p Promus DES to RI 12/25/10;  d. 04/2011 CABG x 1 (LIMA->LAD) @ time of AVR  . Carotid stenosis 05/17/2010   a. dopplers 11/11: RICA 40-50%; LICA 0-39% (follow up due 11/12)  . Chronic bronchitis ([facility])    "multiple times" (03/25/2013)  . 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 Exertional shortness of breath    "related to COPD problems" (03/25/2013)  . Family history of breast cancer    2 daughters; PALB2 mutation in family  . Genetic testing 02/10/2018   PALB2 analysis at [facility] - Negative for familial mutation  . GERD (gastroesophageal reflux disease)   . H/O hiatal hernia   . Hemorrhoid thrombosis    "had it lanced" (03/25/2013)  . Herpes simplex without   mention of complication 02/27/9484  . History of blood transfusion    'w/OHS" (03/25/2013)  . [place], MILD 08/29/2009  . HYPERTENSION 04/14/2007  . MIGRAINE HEADACHE 04/20/2008  . Myocardial infarction [facility]) 2011   "during catherization" (03/25/2013)  . OSTEOPENIA 04/14/2007  . Ovarian cyst   . Pneumonia    "multiple times" (03/25/2013)  . URINARY INCONTINENCE, STRESS, MILD 08/26/2007  . Urine, incontinence, stress female    Surgical Hx: The patient  has a past surgical history that includes Tonsillectomy and adenoidectomy; Nasal sinus surgery (1997; 1998); Blepharoplasty (Bilateral, ~ 1998); thrombosed hemorrohid lanced; Ovarian cyst removal (Right, 2011); Appendectomy; Total abdominal hysterectomy; Aortic valve replacement  (2012); Coronary angioplasty with stent (2010); Cholecystectomy (N/A, 03/25/2013); and Cardiac catheterization (N/A, 07/06/2016).   Current Medications: Current Meds  Medication Sig  . Albuterol Sulfate (PROAIR RESPICLICK) 462 (90 Base) MCG/ACT AEPB Inhale 1 puff into the lungs every 6 (six) hours as needed (wheezing or shortness of breath).  Marland Kitchen aspirin 81 MG tablet Take 81 mg by mouth daily.    . cetirizine (ZYRTEC) 10 MG tablet Take 10 mg by mouth daily.  Marland Kitchen desvenlafaxine (PRISTIQ) 100 MG 24 hr tablet TAKE 1 TABLET BY MOUTH ONCE DAILY  . DEXILANT 60 MG capsule TAKE 1 CAPSULE BY MOUTH ONCE DAILY  . diazepam (VALIUM) 5 MG tablet Take 1 tablet (5 mg total) by mouth every 8 (eight) hours as needed for anxiety or muscle spasms.  . diclofenac (VOLTAREN) 75 MG EC tablet Take 1 tablet (75 mg total) by mouth 2 (two) times daily.  . fluticasone (FLONASE) 50 MCG/ACT nasal spray Place 2 sprays into both nostrils daily.  Marland Kitchen gabapentin (NEURONTIN) 300 MG capsule Take 1 capsule (300 mg total) by mouth at bedtime.  . hydrochlorothiazide (HYDRODIURIL) 12.5 MG tablet TAKE 1 TABLET BY MOUTH ONCE DAILY  . LORazepam (ATIVAN) 0.5 MG tablet TAKE 1 TABLET BY MOUTH TWICE DAILY AS NEEDED FOR ANXIETY (DONT DRIVE 8 HOURS AFTER USING)  . Multiple Vitamins-Minerals (MULTIVITAMIN ADULT PO) Take 1 tablet by mouth daily.   . nitroGLYCERIN (NITROSTAT) 0.4 MG SL tablet Place 0.4 mg under the tongue every 5 (five) minutes as needed for chest pain.  Marland Kitchen propranolol (INDERAL) 20 MG tablet TAKE 1 TABLET BY MOUTH TWICE DAILY  . rosuvastatin (CRESTOR) 20 MG tablet TAKE 1 TABLET BY MOUTH DAILY  . sertraline (ZOLOFT) 50 MG tablet TAKE 1 TABLET BY MOUTH ONCE DAILY  . SPIRIVA HANDIHALER 18 MCG inhalation capsule INHALE 1 PUFF BY MOUTH ONCE DAILY  . SYMBICORT 160-4.5 MCG/ACT inhaler INHALE 2 PUFFS BY MOUTH TWICE DAILY  . [DISCONTINUED] Aclidinium Bromide (TUDORZA PRESSAIR) 400 MCG/ACT AEPB Inhale 1 puff into the lungs 2 (two) times daily.       Allergies:   Codeine sulfate and Hydrocodone-acetaminophen   Social History   Tobacco Use  . Smoking status: Former Smoker    Packs/day: 1.50    Years: 44.00    Pack years: 66.00    Types: Cigarettes    Last attempt to quit: 09/03/2005    Years since quitting: 12.7  . Smokeless tobacco: Never Used  Substance Use Topics  . Alcohol use: Not Currently    Alcohol/week: 0.0 standard drinks  . Drug use: No     Family Hx: The patient's family history includes Asthma in her son; Breast cancer in her daughter; Breast cancer (age of onset: 99) in her daughter; COPD in her father; Cancer in her other; Cirrhosis in her sister; Coronary artery disease in her  other; Heart disease in her father, maternal uncle, mother, and other; Rheum arthritis in her father; Stroke in her brother. There is no history of Colon cancer or Colon polyps.  ROS:   Please see the history of present illness.    Review of Systems  Hematologic/Lymphatic: Bruises/bleeds easily.  Psychiatric/Behavioral: Positive for depression. The patient is nervous/anxious.    All other systems reviewed and are negative.   EKGs/Labs/Other Test Reviewed:    EKG:  EKG is  ordered today.  The ekg ordered today demonstrates normal sinus rhythm, heart rate 59, normal axis, subtle T wave inversions V3-V5, QTC 413  Recent Labs: 05/21/2018: BUN 9; Creatinine, Ser 0.63; Hemoglobin 12.5; Platelets 222; Potassium 4.8; Sodium 132   Recent Lipid Panel Lab Results  Component Value Date/Time   CHOL 194 03/20/2017 08:22 AM   TRIG 138.0 03/20/2017 08:22 AM   HDL 73.70 03/20/2017 08:22 AM   CHOLHDL 3 03/20/2017 08:22 AM   LDLCALC 92 03/20/2017 08:22 AM   LDLDIRECT 65.0 06/19/2017 09:01 AM    Physical Exam:    VS:  BP 134/82   Pulse (!) 59   Ht 5' 2" (1.575 m)   Wt 157 lb 12.8 oz (71.6 kg)   BMI 28.86 kg/m     Wt Readings from Last 3 Encounters:  05/21/18 157 lb 12.8 oz (71.6 kg)  04/04/18 155 lb (70.3 kg)  03/18/18 159 lb 9.6 oz  (72.4 kg)     Physical Exam  Constitutional: She is oriented to person, place, and time. She appears well-developed and well-nourished. No distress.  HENT:  Head: Normocephalic and atraumatic.  Eyes: No scleral icterus.  Neck: No JVD present. No thyromegaly present.  Cardiovascular: Normal rate and regular rhythm.  Murmur heard.  Low-pitched systolic murmur is present with a grade of 1/6 at the upper right sternal border. Pulmonary/Chest: Effort normal. She has no rales.  Abdominal: Soft. She exhibits no distension.  Musculoskeletal: She exhibits no edema.  Lymphadenopathy:    She has no cervical adenopathy.  Neurological: She is alert and oriented to person, place, and time.  Skin: Skin is warm and dry.  Psychiatric: She has a normal mood and affect.    ASSESSMENT & PLAN:    Coronary artery disease involving native coronary artery of native heart with angina pectoris ([place]) History of MI and prior PCI of the RCA and ramus intermediate, subsequent CABG in 2012.  Cardiac catheterization 2017 demonstrated patent stents and no significant disease elsewhere.  Her LIMA-LAD was atretic.  She now presents with chest discomfort worrisome for progressive angina.  ECG does demonstrate subtle T wave inversions V3-V5.  I have recommended proceeding with cardiac catheterization to further evaluate.  I discussed this with Dr. Burt Knack who agreed.  Risks and benefits of cardiac catheterization have been discussed with the patient.  These include bleeding, infection, kidney damage, stroke, heart attack, death.  The patient understands these risks and is willing to proceed.   -Schedule cardiac catheterization in the next 2 weeks  -Labs today: BMET, CBC  -Continue aspirin, rosuvastatin, propranolol  S/P AVR (aortic valve replacement) Continue SBE prophylaxis.  Arrange follow-up echocardiogram.  Essential hypertension Blood pressure somewhat above target today.  However, she has been doing significant  amounts of stress recently.  I did consider whether to place her on isosorbide to control her angina.  However, she has a history of significant headaches with sublingual nitroglycerin.  At this point, continue current medications and continue to monitor blood pressure.  Hyperlipidemia LDL goal <70 LDL optimal on most recent lab work.  Continue current Rx.     Dispo:  Return in about 2 weeks (around 06/04/2018) for Post Procedure Follow Up, w/ Dr. Cooper, or Hendry Speas, PA-C.   Medication Adjustments/Labs and Tests Ordered: Current medicines are reviewed at length with the patient today.  Concerns regarding medicines are outlined above.  Tests Ordered: Orders Placed This Encounter  Procedures  . Basic Metabolic Panel (BMET)  . CBC  . EKG 12-Lead  . ECHOCARDIOGRAM COMPLETE   Medication Changes: No orders of the defined types were placed in this encounter.   Signed, Elin Fenley, PA-C  05/21/2018 5:47 PM    [facility] [address], [place], [place]  [address] Phone: [phone]; Fax: [phone]    

## 2018-05-21 ENCOUNTER — Encounter: Payer: Self-pay | Admitting: Physician Assistant

## 2018-05-21 ENCOUNTER — Ambulatory Visit (INDEPENDENT_AMBULATORY_CARE_PROVIDER_SITE_OTHER): Payer: PPO | Admitting: Physician Assistant

## 2018-05-21 ENCOUNTER — Telehealth: Payer: Self-pay | Admitting: *Deleted

## 2018-05-21 VITALS — BP 134/82 | HR 59 | Ht 62.0 in | Wt 157.8 lb

## 2018-05-21 DIAGNOSIS — Z952 Presence of prosthetic heart valve: Secondary | ICD-10-CM | POA: Diagnosis not present

## 2018-05-21 DIAGNOSIS — I25119 Atherosclerotic heart disease of native coronary artery with unspecified angina pectoris: Secondary | ICD-10-CM | POA: Diagnosis not present

## 2018-05-21 DIAGNOSIS — I10 Essential (primary) hypertension: Secondary | ICD-10-CM | POA: Diagnosis not present

## 2018-05-21 DIAGNOSIS — E785 Hyperlipidemia, unspecified: Secondary | ICD-10-CM | POA: Diagnosis not present

## 2018-05-21 LAB — CBC
HEMATOCRIT: 37 % (ref 34.0–46.6)
HEMOGLOBIN: 12.5 g/dL (ref 11.1–15.9)
MCH: 29.1 pg (ref 26.6–33.0)
MCHC: 33.8 g/dL (ref 31.5–35.7)
MCV: 86 fL (ref 79–97)
PLATELETS: 222 10*3/uL (ref 150–450)
RBC: 4.29 x10E6/uL (ref 3.77–5.28)
RDW: 15.3 % (ref 12.3–15.4)
WBC: 6 10*3/uL (ref 3.4–10.8)

## 2018-05-21 LAB — BASIC METABOLIC PANEL
BUN/Creatinine Ratio: 14 (ref 12–28)
BUN: 9 mg/dL (ref 8–27)
CO2: 27 mmol/L (ref 20–29)
Calcium: 9.5 mg/dL (ref 8.7–10.3)
Chloride: 91 mmol/L — ABNORMAL LOW (ref 96–106)
Creatinine, Ser: 0.63 mg/dL (ref 0.57–1.00)
GFR calc Af Amer: 106 mL/min/{1.73_m2} (ref >59)
GFR, EST NON AFRICAN AMERICAN: 92 mL/min/{1.73_m2} (ref >59)
Glucose: 99 mg/dL (ref 65–99)
Potassium: 4.8 mmol/L (ref 3.5–5.2)
SODIUM: 132 mmol/L — AB (ref 134–144)

## 2018-05-21 NOTE — Patient Instructions (Addendum)
Medication Instructions:  1. Your physician recommends that you continue on your current medications as directed. Please refer to the Current Medication list given to you today.   PT HAS BEEN ADVISED TO USE NTG IF WORSENING SYMPTOMS OR TO GO TO THE ED  Labwork: TODAY BMET, CBC   Testing/Procedures: 1. Your physician has requested that you have a cardiac catheterization. Cardiac catheterization is used to diagnose and/or treat various heart conditions. Doctors may recommend this procedure for a number of different reasons. The most common reason is to evaluate chest pain. Chest pain can be a symptom of coronary artery disease (CAD), and cardiac catheterization can show whether plaque is narrowing or blocking your heart's arteries. This procedure is also used to evaluate the valves, as well as measure the blood flow and oxygen levels in different parts of your heart. For further information please visit [facility]tn. Please follow instruction sheet, as given.  2. Your physician has requested that you have an echocardiogram. Echocardiography is a painless test that uses sound waves to create images of your heart. It provides your doctor with information about the size and shape of your heart and how well your heart's chambers and valves are working. This procedure takes approximately one hour. There are no restrictions for this procedure.   Follow-Up: Richardson Dopp, [facility] 06/16/18 @ 8:45 POST CATH FOLLOW UP   Any Other Special Instructions Will Be Listed Below (If Applicable).  If you need a refill on your cardiac medications before your next appointment, please call your pharmacy.     [facility] OFFICE [place], [address] [place] [address] Dept: [place]: [place]  05/21/2018  You are scheduled for a Cardiac Catheterization on Monday, September 23 with Dr. Larae Grooms 9 AM.  1. Please arrive at the [facility] (Main Entrance A) at [facility]: [place], [place] [address] at 7:00 am (This time is two hours before your procedure to ensure your preparation). Free valet parking service is available.   Special note: Every effort is made to have your procedure done on time. Please understand that emergencies sometimes delay scheduled procedures.  2. Diet: Do not eat solid foods after midnight.  The patient may have clear liquids until 5am upon the day of the procedure.  3. Labs: You will need to have blood drawn on Wednesday, September 18 at [facility]. [address]  Open: 7:30am - 5pm    Phone: [phone]. You do not need to be fasting.  4. Medication instructions in preparation for your procedure:     HOLD HCTZ THE MORNING OF CATH ON 05/26/18  On the morning of your procedure, take your Aspirin and any morning medicines NOT listed above.  You may use sips of water.  Contrast Allergy: No  5. Plan for one night stay--bring personal belongings. 6. Bring a current list of your medications and current insurance cards. 7. You MUST have a responsible person to drive you home. 8. Someone MUST be with you the first 24 hours after you arrive home or your discharge will be delayed. 9. Please wear clothes that are easy to get on and off and wear slip-on shoes.  Thank you for allowing [place] to care for you!   -- [facility]

## 2018-05-21 NOTE — Telephone Encounter (Signed)
Left message to go over lab results.  

## 2018-05-21 NOTE — Telephone Encounter (Signed)
-----   Message from Liliane Shi, PA-C sent at 05/21/2018  5:00 PM EDT ----- All values are normal or within acceptable limits.   Medication changes / Follow up labs / Other changes or recommendations:    - Continue current medications and follow up as planned.  Richardson Dopp, PA-C 05/21/2018 5:00 PM

## 2018-05-22 ENCOUNTER — Telehealth: Payer: Self-pay | Admitting: *Deleted

## 2018-05-22 NOTE — Telephone Encounter (Signed)
-----   Message from Liliane Shi, PA-C sent at 05/21/2018  5:00 PM EDT ----- All values are normal or within acceptable limits.   Medication changes / Follow up labs / Other changes or recommendations:    - Continue current medications and follow up as planned.  Richardson Dopp, PA-C 05/21/2018 5:00 PM

## 2018-05-22 NOTE — Telephone Encounter (Signed)
No answer, voice mail 

## 2018-05-22 NOTE — Telephone Encounter (Signed)
Pt contacted pre-catheterization scheduled at [facility] for: Monday May 26, 2018 9 AM Verify arrival time and place: [facility] Entrance A at: 7 AM  No solid food after midnight prior to cath, clear liquids until 5 AM day of procedure. Verify allergies in Epic Verify no diabetes medications.  Hold: HCTZ -AM of procedure  Except hold medications AM meds can be  taken pre-cath with sip of water including: ASA 81 mg  Confirm patient has responsible person to drive home post procedure and for 24 hours after you arrive home.   LMTCB to review instructions with patient.

## 2018-05-23 ENCOUNTER — Telehealth: Payer: Self-pay | Admitting: *Deleted

## 2018-05-23 ENCOUNTER — Other Ambulatory Visit: Payer: Self-pay

## 2018-05-23 ENCOUNTER — Ambulatory Visit (HOSPITAL_COMMUNITY): Payer: PPO | Attending: Cardiology

## 2018-05-23 ENCOUNTER — Encounter: Payer: Self-pay | Admitting: Physician Assistant

## 2018-05-23 DIAGNOSIS — I119 Hypertensive heart disease without heart failure: Secondary | ICD-10-CM | POA: Insufficient documentation

## 2018-05-23 DIAGNOSIS — Z87891 Personal history of nicotine dependence: Secondary | ICD-10-CM | POA: Diagnosis not present

## 2018-05-23 DIAGNOSIS — E785 Hyperlipidemia, unspecified: Secondary | ICD-10-CM | POA: Insufficient documentation

## 2018-05-23 DIAGNOSIS — Z951 Presence of aortocoronary bypass graft: Secondary | ICD-10-CM | POA: Diagnosis not present

## 2018-05-23 DIAGNOSIS — I071 Rheumatic tricuspid insufficiency: Secondary | ICD-10-CM | POA: Diagnosis not present

## 2018-05-23 DIAGNOSIS — Z952 Presence of prosthetic heart valve: Secondary | ICD-10-CM

## 2018-05-23 DIAGNOSIS — I252 Old myocardial infarction: Secondary | ICD-10-CM | POA: Insufficient documentation

## 2018-05-23 DIAGNOSIS — Z953 Presence of xenogenic heart valve: Secondary | ICD-10-CM | POA: Diagnosis not present

## 2018-05-23 DIAGNOSIS — I2510 Atherosclerotic heart disease of native coronary artery without angina pectoris: Secondary | ICD-10-CM | POA: Insufficient documentation

## 2018-05-23 DIAGNOSIS — J449 Chronic obstructive pulmonary disease, unspecified: Secondary | ICD-10-CM | POA: Insufficient documentation

## 2018-05-23 NOTE — Telephone Encounter (Signed)
-----   Message from Liliane Shi, [place] sent at 05/23/2018  5:12 PM EDT ----- This study demonstrates:  Normal heart squeeze (ejection fraction), mildly impaired relaxation (diastolic dysfunction).  The aortic valve replacement is functioning normally with stable measurements.   Medication changes / Follow up studies / Other recommendations:    - Continue current medications and follow up as planned.  Please send results to the PCP:  Marin Olp, MD  Richardson Dopp, PA-C 05/23/2018 5:10 PM

## 2018-05-23 NOTE — Telephone Encounter (Signed)
Left message to go over echo results. 

## 2018-05-26 ENCOUNTER — Encounter (HOSPITAL_COMMUNITY)
Admission: RE | Disposition: A | Discharge status: Home / Self Care | Payer: Self-pay | Source: Ambulatory Visit | Attending: Interventional Cardiology

## 2018-05-26 ENCOUNTER — Other Ambulatory Visit: Payer: Self-pay

## 2018-05-26 ENCOUNTER — Encounter (HOSPITAL_COMMUNITY): Payer: Self-pay | Admitting: Interventional Cardiology

## 2018-05-26 ENCOUNTER — Ambulatory Visit (HOSPITAL_COMMUNITY)
Admission: RE | Admit: 2018-05-26 | Discharge: 2018-05-26 | Disposition: A | Discharge status: Home / Self Care | Payer: PPO | Source: Ambulatory Visit | Attending: Interventional Cardiology | Admitting: Interventional Cardiology

## 2018-05-26 DIAGNOSIS — Z8262 Family history of osteoporosis: Secondary | ICD-10-CM | POA: Insufficient documentation

## 2018-05-26 DIAGNOSIS — E785 Hyperlipidemia, unspecified: Secondary | ICD-10-CM | POA: Insufficient documentation

## 2018-05-26 DIAGNOSIS — Z8619 Personal history of other infectious and parasitic diseases: Secondary | ICD-10-CM | POA: Insufficient documentation

## 2018-05-26 DIAGNOSIS — J449 Chronic obstructive pulmonary disease, unspecified: Secondary | ICD-10-CM | POA: Diagnosis not present

## 2018-05-26 DIAGNOSIS — Z86010 Personal history of colonic polyps: Secondary | ICD-10-CM | POA: Diagnosis not present

## 2018-05-26 DIAGNOSIS — Z953 Presence of xenogenic heart valve: Secondary | ICD-10-CM | POA: Insufficient documentation

## 2018-05-26 DIAGNOSIS — Z90710 Acquired absence of both cervix and uterus: Secondary | ICD-10-CM | POA: Diagnosis not present

## 2018-05-26 DIAGNOSIS — I25118 Atherosclerotic heart disease of native coronary artery with other forms of angina pectoris: Secondary | ICD-10-CM | POA: Diagnosis not present

## 2018-05-26 DIAGNOSIS — Z823 Family history of stroke: Secondary | ICD-10-CM | POA: Insufficient documentation

## 2018-05-26 DIAGNOSIS — Z7951 Long term (current) use of inhaled steroids: Secondary | ICD-10-CM | POA: Diagnosis not present

## 2018-05-26 DIAGNOSIS — Z955 Presence of coronary angioplasty implant and graft: Secondary | ICD-10-CM | POA: Diagnosis not present

## 2018-05-26 DIAGNOSIS — Z951 Presence of aortocoronary bypass graft: Secondary | ICD-10-CM | POA: Insufficient documentation

## 2018-05-26 DIAGNOSIS — Z885 Allergy status to narcotic agent status: Secondary | ICD-10-CM | POA: Insufficient documentation

## 2018-05-26 DIAGNOSIS — I6523 Occlusion and stenosis of bilateral carotid arteries: Secondary | ICD-10-CM | POA: Insufficient documentation

## 2018-05-26 DIAGNOSIS — Z79899 Other long term (current) drug therapy: Secondary | ICD-10-CM | POA: Insufficient documentation

## 2018-05-26 DIAGNOSIS — I252 Old myocardial infarction: Secondary | ICD-10-CM | POA: Diagnosis not present

## 2018-05-26 DIAGNOSIS — I10 Essential (primary) hypertension: Secondary | ICD-10-CM | POA: Insufficient documentation

## 2018-05-26 DIAGNOSIS — Z8249 Family history of ischemic heart disease and other diseases of the circulatory system: Secondary | ICD-10-CM | POA: Insufficient documentation

## 2018-05-26 DIAGNOSIS — Z9889 Other specified postprocedural states: Secondary | ICD-10-CM | POA: Diagnosis not present

## 2018-05-26 DIAGNOSIS — Z7982 Long term (current) use of aspirin: Secondary | ICD-10-CM | POA: Diagnosis not present

## 2018-05-26 DIAGNOSIS — F329 Major depressive disorder, single episode, unspecified: Secondary | ICD-10-CM | POA: Diagnosis not present

## 2018-05-26 DIAGNOSIS — Z87891 Personal history of nicotine dependence: Secondary | ICD-10-CM | POA: Diagnosis not present

## 2018-05-26 DIAGNOSIS — M8580 Other specified disorders of bone density and structure, unspecified site: Secondary | ICD-10-CM | POA: Insufficient documentation

## 2018-05-26 DIAGNOSIS — Z9049 Acquired absence of other specified parts of digestive tract: Secondary | ICD-10-CM | POA: Insufficient documentation

## 2018-05-26 DIAGNOSIS — I25119 Atherosclerotic heart disease of native coronary artery with unspecified angina pectoris: Secondary | ICD-10-CM

## 2018-05-26 HISTORY — PX: CORONARY ANGIOGRAPHY: CATH118303

## 2018-05-26 SURGERY — CORONARY ANGIOGRAPHY (CATH LAB)
Anesthesia: LOCAL

## 2018-05-26 MED ORDER — SODIUM CHLORIDE 0.9% FLUSH: 3.0000 mL | Freq: Two times a day (BID) | INTRAVENOUS | Status: DC

## 2018-05-26 MED ORDER — SODIUM CHLORIDE 0.9 % IV SOLN: INTRAVENOUS | Status: AC

## 2018-05-26 MED ORDER — FENTANYL CITRATE (PF) 100 MCG/2ML IJ SOLN
INTRAMUSCULAR | Status: AC
  Filled 2018-05-26: qty 2

## 2018-05-26 MED ORDER — SODIUM CHLORIDE 0.9% FLUSH: 3.0000 mL | INTRAVENOUS | Status: DC | PRN

## 2018-05-26 MED ORDER — SODIUM CHLORIDE 0.9 % WEIGHT BASED INFUSION
3.0000 mL/kg/h | INTRAVENOUS | Status: DC
  Administered 2018-05-26: 3 mL/kg/h via INTRAVENOUS

## 2018-05-26 MED ORDER — LIDOCAINE HCL (PF) 1 % IJ SOLN
INTRAMUSCULAR | Status: DC | PRN
  Administered 2018-05-26: 2 mL

## 2018-05-26 MED ORDER — IOHEXOL 350 MG/ML SOLN
INTRAVENOUS | Status: DC | PRN
  Administered 2018-05-26: 30 mL via INTRA_ARTERIAL

## 2018-05-26 MED ORDER — HEPARIN SODIUM (PORCINE) 1000 UNIT/ML IJ SOLN
INTRAMUSCULAR | Status: DC | PRN
  Administered 2018-05-26: 3500 [IU] via INTRAVENOUS

## 2018-05-26 MED ORDER — VERAPAMIL HCL 2.5 MG/ML IV SOLN
INTRAVENOUS | Status: AC
  Filled 2018-05-26: qty 2

## 2018-05-26 MED ORDER — FENTANYL CITRATE (PF) 100 MCG/2ML IJ SOLN
INTRAMUSCULAR | Status: DC | PRN
  Administered 2018-05-26: 25 ug via INTRAVENOUS

## 2018-05-26 MED ORDER — HEPARIN (PORCINE) IN NACL 1000-0.9 UT/500ML-% IV SOLN
INTRAVENOUS | Status: AC
  Filled 2018-05-26: qty 1000

## 2018-05-26 MED ORDER — SODIUM CHLORIDE 0.9 % WEIGHT BASED INFUSION: 1.0000 mL/kg/h | INTRAVENOUS | Status: DC

## 2018-05-26 MED ORDER — SODIUM CHLORIDE 0.9 % IV SOLN: 250.0000 mL | INTRAVENOUS | Status: DC | PRN

## 2018-05-26 MED ORDER — ONDANSETRON HCL 4 MG/2ML IJ SOLN: 4.0000 mg | Freq: Four times a day (QID) | INTRAMUSCULAR | Status: DC | PRN

## 2018-05-26 MED ORDER — HEPARIN (PORCINE) IN NACL 1000-0.9 UT/500ML-% IV SOLN
INTRAVENOUS | Status: DC | PRN
  Administered 2018-05-26 (×2): 500 mL

## 2018-05-26 MED ORDER — HEPARIN SODIUM (PORCINE) 1000 UNIT/ML IJ SOLN
INTRAMUSCULAR | Status: AC
  Filled 2018-05-26: qty 1

## 2018-05-26 MED ORDER — MIDAZOLAM HCL 2 MG/2ML IJ SOLN
INTRAMUSCULAR | Status: DC | PRN
  Administered 2018-05-26: 2 mg via INTRAVENOUS

## 2018-05-26 MED ORDER — MIDAZOLAM HCL 2 MG/2ML IJ SOLN
INTRAMUSCULAR | Status: AC
  Filled 2018-05-26: qty 2

## 2018-05-26 MED ORDER — ASPIRIN 81 MG PO CHEW: 81.0000 mg | CHEWABLE_TABLET | ORAL | Status: DC

## 2018-05-26 MED ORDER — VERAPAMIL HCL 2.5 MG/ML IV SOLN
INTRAVENOUS | Status: DC | PRN
  Administered 2018-05-26: 10 mL via INTRA_ARTERIAL

## 2018-05-26 MED ORDER — LIDOCAINE HCL (PF) 1 % IJ SOLN
INTRAMUSCULAR | Status: AC
  Filled 2018-05-26: qty 30

## 2018-05-26 SURGICAL SUPPLY — 10 items
CATH INFINITI 5 FR JL3.5 (CATHETERS) ×1 IMPLANT
CATH INFINITI 5FR MULTPACK ANG (CATHETERS) ×1 IMPLANT
DEVICE RAD COMP TR BAND LRG (VASCULAR PRODUCTS) ×1 IMPLANT
GLIDESHEATH SLEND SS 6F .021 (SHEATH) ×1 IMPLANT
GUIDEWIRE INQWIRE 1.5J.035X260 (WIRE) IMPLANT
INQWIRE 1.5J .035X260CM (WIRE) ×2
KIT HEART LEFT (KITS) ×2 IMPLANT
PACK CARDIAC CATHETERIZATION (CUSTOM PROCEDURE TRAY) ×2 IMPLANT
TRANSDUCER W/STOPCOCK (MISCELLANEOUS) ×2 IMPLANT
TUBING CIL FLEX 10 FLL-RA (TUBING) ×2 IMPLANT

## 2018-05-26 NOTE — Progress Notes (Signed)
Pt ambulated to BR, standby assist. Pt tolerated ambulation and voiding without difficulty. No new bleeding noted from right wrist.

## 2018-05-26 NOTE — Telephone Encounter (Signed)
-----   Message from Liliane Shi, [place] sent at 05/23/2018  5:12 PM EDT ----- This study demonstrates:  Normal heart squeeze (ejection fraction), mildly impaired relaxation (diastolic dysfunction).  The aortic valve replacement is functioning normally with stable measurements.   Medication changes / Follow up studies / Other recommendations:    - Continue current medications and follow up as planned.  Please send results to the PCP:  Marin Olp, MD  Richardson Dopp, PA-C 05/23/2018 5:10 PM

## 2018-05-26 NOTE — Telephone Encounter (Signed)
Left message x 2 to go over echo results. Lmom results have been sent to [facility]. If any questions please call [phone].

## 2018-05-26 NOTE — Telephone Encounter (Signed)
Procedure done 05/26/18

## 2018-05-26 NOTE — Interval H&P Note (Signed)
Cath Lab Visit (complete for each Cath Lab visit)  Clinical Evaluation Leading to the Procedure:   ACS: No.  Non-ACS:    Anginal Classification: CCS III  Anti-ischemic medical therapy: Maximal Therapy (2 or more classes of medications)  Non-Invasive Test Results: none  Prior CABG: Previous CABG      History and Physical Interval Note:  05/26/2018 9:13 AM  Katherine Foley  has presented today for surgery, with the diagnosis of cad - angina  The various methods of treatment have been discussed with the patient and family. After consideration of risks, benefits and other options for treatment, the patient has consented to  Procedure(s): LEFT HEART CATH AND CORS/GRAFTS ANGIOGRAPHY (N/A) as a surgical intervention .  The patient's history has been reviewed, patient examined, no change in status, stable for surgery.  I have reviewed the patient's chart and labs.  Questions were answered to the patient's satisfaction.     Larae Grooms

## 2018-05-26 NOTE — Discharge Instructions (Signed)

## 2018-05-28 ENCOUNTER — Ambulatory Visit: Payer: PPO

## 2018-05-28 DIAGNOSIS — M5416 Radiculopathy, lumbar region: Secondary | ICD-10-CM | POA: Diagnosis not present

## 2018-06-15 NOTE — Progress Notes (Signed)
Cardiology Office Note:    Date:  06/16/2018   ID:  Katherine, Foley 12-Oct-1950, MRN [id number]  PCP:  Katherine Olp, MD  Cardiologist:  Katherine Mocha, MD  Electrophysiologist:  None   Referring MD: Katherine Olp, MD   Chief Complaint  Patient presents with  . Follow-up    s/p cardiac cath    History of Present Illness:    Katherine Foley is a 67 y.o. female with coronary artery disease and aortic valve disease.  She had a NSTEMI in 2011 treated with PCI of the RCA.  She also underwent PCI of the RI in 2012.  She underwent a bioprosthetic AVR + CABG with a L-LAD in 2012.  She was last seen 05/21/18 for follow up and noted chest pain similar to her prior angina.  Cardiac Catheterization was arranged.  This demonstrated patent stents in the RI and RCA.  The LIMA was known to be atretic and there was just mild disease in the mid LAD.  Medical therapy was recommended.    Katherine Foley returns for follow-up.  She is here alone.  Her daughter is back in the hospital and she is quite anxious.  She is eager to get back to the hospital ([place]).  She has not had further chest discomfort, shortness of breath, syncope.  Prior CV studies:   The following studies were reviewed today:  Cardiac Catheterization 05/26/18 LAD mid 52 RI stent patent LCx mid 25 RCA ost 30, prox stent patent with 25 ISR, dist 25 LIMA known to be atretic    Echo 05/23/18 Mild conc LVH, EF 60-65, no RWMA, Gr 1 DD, s/p AVR without stenosis or significant regurgitation (peak 21), no RVSF, trivial MR, mild TR  Echo 12/26/16 EF 65-70, no RWMA, Gr 1 DD, AVR ok with mean 11, trivial TR  Cardiac Catheterization 07/06/16 LM ok LAD mid 38 RI stent patent PCx prox 30 RCA mid stent patent with 30 ISR L-LAD atretic   Echo 12/02/15 EF 65-70, no RWMA, Gr 1 DD, normally functioning AVR (mean 16), trivial MR, trivial TR  Carotid US 12/02/15  1-39% bilateral ICA stenosis. FU as needed   Nuclear stress test  12/02/15 EF 70.  Normal pharmacologic nuclear stress test with no evidence of prior infarct or ischemia.   Past Medical History:  Diagnosis Date  . Adenomatous colon polyp   . ANXIETY DEPRESSION 02/15/2009  . AORTIC STENOSIS 07/07/2010   a. echo 12/23/10: EF 60-65%, mod to severe AS with mean gradient 29 mmHg;  b. 04/2011 s/p bioprosthetic AVR. // Echo 9/19:  Mild conc LVH, EF 60-65, no RWMA, Gr 1 DD, AVR ok (mean 10, peak 21), trivial MR, normal RVSF, mild TR   . Barrett's esophagus 04/14/2007  . Barrett's esophagus 2000  . CAD, NATIVE VESSEL 05/17/2010   a. NSTEMI 8/11 - BMS to RCA;  b. cath 4/12: dLAD 50%, RI 80% (PCI), AVCFX 30%, pRCA 30%, stent ok, 40-50, then 50% dist RCA;  c. s/p Promus DES to RI 12/25/10;  d. 04/2011 CABG x 1 (LIMA->LAD) @ time of AVR  . Carotid stenosis 05/17/2010   a. dopplers 24/58: RICA 09-98%; LICA 3-38% (follow up due 11/12)  . Chronic bronchitis ([place])    "multiple times" (03/25/2013)  . 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 Exertional shortness of breath    "related to COPD problems" (03/25/2013)  . Family history of breast cancer    2 daughters; PALB2 mutation in family  .  Genetic testing 02/10/2018   PALB2 analysis at [facility] - Negative for familial mutation  . GERD (gastroesophageal reflux disease)   . H/O hiatal hernia   . Hemorrhoid thrombosis    "had it lanced" (03/25/2013)  . Herpes simplex without mention of complication 10/25/9796  . History of blood transfusion    'w/OHS" (03/25/2013)  . [place], MILD 08/29/2009  . HYPERTENSION 04/14/2007  . MIGRAINE HEADACHE 04/20/2008  . Myocardial infarction [facility]) 2011   "during catherization" (03/25/2013)  . OSTEOPENIA 04/14/2007  . Ovarian cyst   . Pneumonia    "multiple times" (03/25/2013)  . URINARY INCONTINENCE, STRESS, MILD 08/26/2007  . Urine, incontinence, stress female    Surgical Hx: The patient  has a past surgical history that includes Tonsillectomy and adenoidectomy; Nasal  sinus surgery (1997; 1998); Blepharoplasty (Bilateral, ~ 1998); thrombosed hemorrohid lanced; Ovarian cyst removal (Right, 2011); Appendectomy; Total abdominal hysterectomy; Aortic valve replacement (2012); Coronary angioplasty with stent (2010); Cholecystectomy (N/A, 03/25/2013); Cardiac catheterization (N/A, 07/06/2016); and CORONARY ANGIOGRAPHY (N/A, 05/26/2018).   Current Medications: Current Meds  Medication Sig  . Albuterol Sulfate (PROAIR RESPICLICK) 921 (90 Base) MCG/ACT AEPB Inhale 1 puff into the lungs every 6 (six) hours as needed (wheezing or shortness of breath).  Marland Kitchen aspirin 81 MG tablet Take 81 mg by mouth daily.    . cetirizine (ZYRTEC) 10 MG tablet Take 10 mg by mouth daily as needed for allergies.   Marland Kitchen desvenlafaxine (PRISTIQ) 100 MG 24 hr tablet TAKE 1 TABLET BY MOUTH ONCE DAILY  . DEXILANT 60 MG capsule TAKE 1 CAPSULE BY MOUTH ONCE DAILY  . fluticasone (FLONASE) 50 MCG/ACT nasal spray Place 2 sprays into both nostrils daily.  Marland Kitchen gabapentin (NEURONTIN) 300 MG capsule Take 300 mg by mouth 3 (three) times daily.  . hydrochlorothiazide (HYDRODIURIL) 12.5 MG tablet TAKE 1 TABLET BY MOUTH ONCE DAILY  . LORazepam (ATIVAN) 0.5 MG tablet TAKE 1 TABLET BY MOUTH TWICE DAILY AS NEEDED FOR ANXIETY (DONT DRIVE 8 HOURS AFTER USING)  . Multiple Vitamins-Minerals (MULTIVITAMIN ADULT PO) Take 1 tablet by mouth daily.   . nitroGLYCERIN (NITROSTAT) 0.4 MG SL tablet Place 0.4 mg under the tongue every 5 (five) minutes as needed for chest pain.  Marland Kitchen propranolol (INDERAL) 20 MG tablet TAKE 1 TABLET BY MOUTH TWICE DAILY  . rosuvastatin (CRESTOR) 20 MG tablet TAKE 1 TABLET BY MOUTH DAILY  . sertraline (ZOLOFT) 50 MG tablet TAKE 1 TABLET BY MOUTH ONCE DAILY  . SPIRIVA HANDIHALER 18 MCG inhalation capsule INHALE 1 PUFF BY MOUTH ONCE DAILY  . SYMBICORT 160-4.5 MCG/ACT inhaler INHALE 2 PUFFS BY MOUTH TWICE DAILY  . [DISCONTINUED] Aclidinium Bromide (TUDORZA PRESSAIR) 400 MCG/ACT AEPB Inhale 1 puff into the lungs  2 (two) times daily.  . [DISCONTINUED] gabapentin (NEURONTIN) 300 MG capsule Take 1 capsule (300 mg total) by mouth at bedtime. (Patient taking differently: Take 300 mg by mouth 3 (three) times daily. )     Allergies:   Codeine sulfate and Hydrocodone-acetaminophen   Social History   Tobacco Use  . Smoking status: Former Smoker    Packs/day: 1.50    Years: 44.00    Pack years: 66.00    Types: Cigarettes    Last attempt to quit: 09/03/2005    Years since quitting: 12.7  . Smokeless tobacco: Never Used  Substance Use Topics  . Alcohol use: Not Currently    Alcohol/week: 0.0 standard drinks  . Drug use: No     Family Hx: The patient's family history includes  Asthma in her son; Breast cancer in her daughter; Breast cancer (age of onset: 40) in her daughter; COPD in her father; Cancer in her other; Cirrhosis in her sister; Coronary artery disease in her other; Heart disease in her father, maternal uncle, mother, and other; Rheum arthritis in her father; Stroke in her brother. There is no history of Colon cancer or Colon polyps.  ROS:   Please see the history of present illness.    ROS All other systems reviewed and are negative.   EKGs/Labs/Other Test Reviewed:    EKG:  EKG is  ordered today.  The ekg ordered today demonstrates normal sinus rhythm, heart rate 65, normal axis, T wave inversions V3-V5, QTC 405, similar to prior tracing  Recent Labs: 05/21/2018: BUN 9; Creatinine, Ser 0.63; Hemoglobin 12.5; Platelets 222; Potassium 4.8; Sodium 132   Recent Lipid Panel Lab Results  Component Value Date/Time   CHOL 194 03/20/2017 08:22 AM   TRIG 138.0 03/20/2017 08:22 AM   HDL 73.70 03/20/2017 08:22 AM   CHOLHDL 3 03/20/2017 08:22 AM   LDLCALC 92 03/20/2017 08:22 AM   LDLDIRECT 65.0 06/19/2017 09:01 AM    Physical Exam:    VS:  BP 140/62   Pulse 68   Ht _0  (1.6 m)   Wt 150 lb (68 kg)   SpO2 94%   BMI 26.57 kg/m     Wt Readings from Last 3 Encounters:  06/16/18 150 lb  (68 kg)  05/26/18 156 lb (70.8 kg)  05/21/18 157 lb 12.8 oz (71.6 kg)     Physical Exam  Constitutional: She is oriented to person, place, and time. She appears well-developed and well-nourished. No distress.  HENT:  Head: Normocephalic and atraumatic.  Eyes: No scleral icterus.  Neck: No JVD present.  Cardiovascular: Normal rate and regular rhythm.  Murmur heard.  Low-pitched systolic murmur is present with a grade of 1/6 at the upper left sternal border. Pulmonary/Chest: Effort normal. She has no rales.  Abdominal: Soft.  Musculoskeletal: She exhibits no edema.  Right wrist without hematoma  Neurological: She is alert and oriented to person, place, and time.  Skin: Skin is warm and dry.  Psychiatric: Her mood appears anxious.    ASSESSMENT & PLAN:    Coronary artery disease involving native coronary artery of native heart without angina pectoris  Status post prior stenting to the RCA and ramus intermediate and subsequent single-vessel CABG in 2012.  Recent cardiac catheterization demonstrated patent stent in the ramus intermedius and RCA.  LIMA is known to be atretic and she has mid 30% stenosis in the LAD.  She denies further angina.  Blood pressure is somewhat borderline but she is under a considerable amount of stress with her daughter who is back in the hospital with advanced cancer.  We will continue to monitor her blood pressure for now.  Continue aspirin, statin.  S/P AVR (aortic valve replacement) Recent echocardiogram was stable aortic valve prosthesis.  Continue SBE prophylaxis.   Dispo:  Return in about 6 months (around 12/16/2018) for Routine Follow Up, w/ Dr. Burt Knack, or Richardson Dopp, PA-C.   Medication Adjustments/Labs and Tests Ordered: Current medicines are reviewed at length with the patient today.  Concerns regarding medicines are outlined above.  Tests Ordered: Orders Placed This Encounter  Procedures  . EKG 12-Lead   Medication Changes: No orders of the  defined types were placed in this encounter.   Signed, Richardson Dopp, PA-C  06/16/2018 9:24 AM    [facility] [address], [place], [place]  [address] Phone: [phone]; Fax: [phone]

## 2018-06-16 ENCOUNTER — Encounter: Payer: Self-pay | Admitting: Physician Assistant

## 2018-06-16 ENCOUNTER — Ambulatory Visit: Payer: PPO | Admitting: Physician Assistant

## 2018-06-16 VITALS — BP 140/62 | HR 68 | Ht 63.0 in | Wt 150.0 lb

## 2018-06-16 DIAGNOSIS — Z952 Presence of prosthetic heart valve: Secondary | ICD-10-CM

## 2018-06-16 DIAGNOSIS — I2510 Atherosclerotic heart disease of native coronary artery without angina pectoris: Secondary | ICD-10-CM | POA: Diagnosis not present

## 2018-06-16 NOTE — Patient Instructions (Signed)
Medication Instructions:  No changes  If you need a refill on your cardiac medications before your next appointment, please call your pharmacy.   Lab work: None today  If you have labs (blood work) drawn today and your tests are completely normal, you will receive your results only by: Marland Kitchen MyChart Message (if you have MyChart) OR . A paper copy in the mail If you have any lab test that is abnormal or we need to change your treatment, we will call you to review the results.  Testing/Procedures: None   Follow-Up: At [facility], you and your health needs are our priority.  As part of our continuing mission to provide you with exceptional heart care, we have created designated Provider Care Teams.  These Care Teams include your primary Cardiologist (physician) and Advanced Practice Providers (APPs -  Physician Assistants and Nurse Practitioners) who all work together to provide you with the care you need, when you need it. You will need a follow up appointment in:  6 months.  Please call our office 2 months in advance to schedule this appointment.  You may see Sherren Mocha, MD or one of the following Advanced Practice Providers on your designated Care Team: Richardson Dopp, PA-C [place], [place] . Daune Perch, NP  Any Other Special Instructions Will Be Listed Below (If Applicable).

## 2018-06-19 ENCOUNTER — Ambulatory Visit: Payer: PPO

## 2018-06-23 ENCOUNTER — Other Ambulatory Visit: Payer: Self-pay | Admitting: Family Medicine

## 2018-06-25 DIAGNOSIS — M5416 Radiculopathy, lumbar region: Secondary | ICD-10-CM | POA: Diagnosis not present

## 2018-07-07 ENCOUNTER — Other Ambulatory Visit: Payer: Self-pay | Admitting: Family Medicine

## 2018-07-07 ENCOUNTER — Encounter: Payer: Self-pay | Admitting: Family Medicine

## 2018-07-07 ENCOUNTER — Ambulatory Visit (INDEPENDENT_AMBULATORY_CARE_PROVIDER_SITE_OTHER): Payer: PPO | Admitting: Family Medicine

## 2018-07-07 DIAGNOSIS — F331 Major depressive disorder, recurrent, moderate: Secondary | ICD-10-CM

## 2018-07-07 MED ORDER — LORAZEPAM 0.5 MG PO TABS: ORAL_TABLET | ORAL | 1 refills | Status: DC

## 2018-07-07 NOTE — Progress Notes (Signed)
Subjective:  Katherine Foley is a 67 y.o. year old very pleasant female patient who presents for/with See problem oriented charting ROS- no SI. Intense sadness and down mood after loss of daughter. No edema. Admits to anxiety and racing thoughts   Past Medical History-  Patient Active Problem List   Diagnosis Date Noted  . 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 Depression 09/22/2015    Priority: High  . Former smoker 09/22/2015    Priority: High  . S/P AVR (aortic valve replacement) 11/06/2013    Priority: High  . Aortic stenosis 07/07/2010    Priority: High  . CAD (coronary artery disease) 05/17/2010    Priority: High  . COPD GOLD II  07/23/2007    Priority: High  . Hot flashes 03/22/2016    Priority: Medium  . Diastolic dysfunction 69/62/9528    Priority: Medium  . GERD (gastroesophageal reflux disease) 06/04/2014    Priority: Medium  . Carotid stenosis 05/17/2010    Priority: Medium  . HLD (hyperlipidemia) 08/29/2009    Priority: Medium  . Essential hypertension 04/14/2007    Priority: Medium  . Osteopenia 04/14/2007    Priority: Medium  . Allergic rhinitis 09/22/2015    Priority: Low  . Adenomatous colon polyp     Priority: Low  . Herpes simplex virus (HSV) infection 01/16/2010    Priority: Low  . Genetic testing 02/10/2018  . Family history of breast cancer     Medications- reviewed and updated Current Outpatient Medications  Medication Sig Dispense Refill  . Albuterol Sulfate (PROAIR RESPICLICK) 413 (90 Base) MCG/ACT AEPB Inhale 1 puff into the lungs every 6 (six) hours as needed (wheezing or shortness of breath). 1 each 3  . aspirin 81 MG tablet Take 81 mg by mouth daily.      . cetirizine (ZYRTEC) 10 MG tablet Take 10 mg by mouth daily as needed for allergies.     Marland Kitchen desvenlafaxine (PRISTIQ) 100 MG 24 hr tablet TAKE 1 TABLET BY MOUTH ONCE DAILY 90 tablet 1  . DEXILANT 60 MG capsule TAKE 1 CAPSULE BY MOUTH ONCE DAILY 90 capsule 1  . fluticasone  (FLONASE) 50 MCG/ACT nasal spray Place 2 sprays into both nostrils daily. 16 g 1  . gabapentin (NEURONTIN) 300 MG capsule Take 300 mg by mouth 3 (three) times daily.    . hydrochlorothiazide (HYDRODIURIL) 12.5 MG tablet TAKE 1 TABLET BY MOUTH ONCE DAILY 30 tablet 5  . LORazepam (ATIVAN) 0.5 MG tablet TAKE 1 TABLET BY MOUTH TWICE DAILY AS NEEDED FOR ANXIETY. DON'T DRIVE 8 HOURS AFTER USING 60 tablet 1  . Multiple Vitamins-Minerals (MULTIVITAMIN ADULT PO) Take 1 tablet by mouth daily.     . nitroGLYCERIN (NITROSTAT) 0.4 MG SL tablet Place 0.4 mg under the tongue every 5 (five) minutes as needed for chest pain.    Marland Kitchen propranolol (INDERAL) 20 MG tablet TAKE 1 TABLET BY MOUTH TWICE DAILY 60 tablet 5  . rosuvastatin (CRESTOR) 20 MG tablet TAKE 1 TABLET BY MOUTH DAILY 90 tablet 3  . sertraline (ZOLOFT) 50 MG tablet TAKE 1 TABLET BY MOUTH ONCE DAILY 90 tablet 1  . SPIRIVA HANDIHALER 18 MCG inhalation capsule INHALE 1 PUFF BY MOUTH ONCE DAILY 90 capsule 1  . SYMBICORT 160-4.5 MCG/ACT inhaler INHALE 2 PUFFS BY MOUTH TWICE DAILY 16 g 11   No current facility-administered medications for this visit.     Objective: BP 132/86 (BP Location: Left Arm, Patient Position: Sitting, Cuff  Size: Large)   Pulse 79   Temp 98.2 F (36.8 C) (Oral)   Ht 5\' 3"  (1.6 m)   Wt 157 lb 12.8 oz (71.6 kg)   SpO2 98%   BMI 27.95 kg/m  Gen: NAD, resting comfortably teaful through significant portion of visit  Assessment/Plan:  Depression Grief/mourning S: patient has done well for the most part on Pristiq 100mg  24 hour, zoloft 50mg .   Intense stress recently with daughter being very ill- ultimately passing from metastatic cancer 06/27/18. I sent her in some ativan to help her with this process and this helps when she gets really worked up or cant calm mind down and simply cant sleep. Using 1-2 x a day.   Has 69 year old fiancee that she definitely wants to live for- no SI.   Daughter went to [facility] on 12-03-2022  and passed on 12-04-22. This is on top of losing her sister in December and ddealing with other daughter having breast cancer. Even seeing greatgradndaughter is hard for her as she wishes her daughter could have played with her more.   A/P:  Extended counseling today reflecting on daughters life and the goodness of her life yet the tragedy of her death. Will continue pristiq and zoloft. I told her ativan was not the best long term fix but through these tougher months would refill for her #60 with 1 refill then hope to either wean down or come off completely. Should not drive for 8 hours after taking- she feels mostly taking before bed which should not be an issue.   Depression appears table- this is primarily mourning. No SI. She has already reached out to hospice for grief counseling- I told her I thought individual and group counseling both reasonable  Future Appointments  Date Time Provider [place]  04/15/2019  1:00 PM [facility]   We discussed prn follow up as she goes through mourning process- did not schedule planned visit  Lab/Order associations: Moderate episode of recurrent major depressive disorder ([place])  Time Stamp The duration of face-to-face time during this visit was greater than 30 minutes. Greater than 50% of this time was spent in counseling, explanation of diagnosis, planning of further management, and/or coordination of care including primarily discussing loss of daughter and ways to cope/deal with this.    Meds ordered this encounter  Medications  . LORazepam (ATIVAN) 0.5 MG tablet    Sig: TAKE 1 TABLET BY MOUTH TWICE DAILY AS NEEDED FOR ANXIETY. DON'T DRIVE 8 HOURS AFTER USING    Dispense:  60 tablet    Refill:  1   Return precautions advised.  Garret Reddish, MD

## 2018-07-07 NOTE — Assessment & Plan Note (Addendum)
S: patient has done well for the most part on Pristiq 100mg  24 hour, zoloft 50mg .   Intense stress recently with daughter being very ill- ultimately passing from metastatic cancer 06/27/18. I sent her in some ativan to help her with this process and this helps when she gets really worked up or cant calm mind down and simply cant sleep.   A/P:  Extended counseling today reflecting on daughters life and the goodness of her life yet the tragedy of her death. Will continue pristiq and zoloft. I told her ativan was not the best long term fix but through these tougher months would refill for her #60 with 1 refill then hope to either wean down or come off completely. Should not drive for 8 hours after taking- she feels mostly taking before bed which should not be an issue.   Depression appears table- this is primarily mourning. No SI. She has already reached out to hospice for grief counseling- I told her I thought individual and group counseling both reasonable

## 2018-07-07 NOTE — Patient Instructions (Signed)
Health Maintenance Due  Topic Date Due  . MAMMOGRAM  12/06/2017    Depression screen [facility] 2/9 04/04/2018 03/10/2018 01/03/2018  Decreased Interest 2 1 2   Down, Depressed, Hopeless 2 1 2   PHQ - 2 Score 4 2 4   Altered sleeping 3 3 3   Tired, decreased energy 2 1 2   Change in appetite 2 2 3   Feeling bad or failure about yourself  2 2 2   Trouble concentrating 3 2 2   Moving slowly or fidgety/restless 3 2 2   Suicidal thoughts 2 1 1   PHQ-9 Score 21 15 19   Difficult doing work/chores Very difficult Somewhat difficult Somewhat difficult  Some recent data might be hidden

## 2018-07-24 ENCOUNTER — Encounter: Payer: Self-pay | Admitting: Family Medicine

## 2018-07-24 ENCOUNTER — Ambulatory Visit (INDEPENDENT_AMBULATORY_CARE_PROVIDER_SITE_OTHER): Payer: PPO | Admitting: Family Medicine

## 2018-07-24 VITALS — BP 134/88 | HR 68 | Temp 98.6°F | Ht 63.0 in | Wt 158.4 lb

## 2018-07-24 DIAGNOSIS — J0110 Acute frontal sinusitis, unspecified: Secondary | ICD-10-CM | POA: Diagnosis not present

## 2018-07-24 DIAGNOSIS — J441 Chronic obstructive pulmonary disease with (acute) exacerbation: Secondary | ICD-10-CM

## 2018-07-24 MED ORDER — PREDNISONE 20 MG PO TABS: ORAL_TABLET | ORAL | 0 refills | Status: DC

## 2018-07-24 MED ORDER — DOXYCYCLINE HYCLATE 100 MG PO TABS: 100.0000 mg | ORAL_TABLET | Freq: Two times a day (BID) | ORAL | 0 refills | Status: AC

## 2018-07-24 NOTE — Patient Instructions (Addendum)
Appears you have a sinus infection- under 10 days these tend to be viral.  Lets try prednisone as an anti-inflammatory to help calm down the inflammation associated with the sinus infection.  Also appears your COPD has flared up and the prednisone should help with that as well.  COPD flareups are more likely to be bacterial-so I have printed doxycycline antibiotic for you to take if you are not improving within 2 to 3 days-this would also address any bacterial element with the sinus infection-if it was turning into a bacterial infection  See [place] back for fever, chills, progressive shortness of breath or wheezing, failure to improve with treatments

## 2018-07-24 NOTE — Progress Notes (Signed)
Subjective:  Katherine Foley is a 67 y.o. year old very pleasant female patient who presents for/with See problem oriented charting ROS-.Review of Systems  Constitutional: Positive for chills and malaise/fatigue. Negative for fever.  HENT: Positive for ear pain and tinnitus.   Eyes: Negative for blurred vision and double vision.  Respiratory: Positive for cough and wheezing.        Productive with yellow mucus   Cardiovascular: Negative for chest pain and palpitations.  Gastrointestinal: Negative for nausea and vomiting.  Genitourinary: Negative for frequency and urgency.  Skin: Negative for itching and rash.  Neurological: Positive for headaches.       With facial pain    Past Medical History-  Patient Active Problem List   Diagnosis Date Noted  . 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 Depression 09/22/2015    Priority: High  . Former smoker 09/22/2015    Priority: High  . S/P AVR (aortic valve replacement) 11/06/2013    Priority: High  . Aortic stenosis 07/07/2010    Priority: High  . CAD (coronary artery disease) 05/17/2010    Priority: High  . COPD GOLD II  07/23/2007    Priority: High  . Hot flashes 03/22/2016    Priority: Medium  . Diastolic dysfunction 16/94/5038    Priority: Medium  . GERD (gastroesophageal reflux disease) 06/04/2014    Priority: Medium  . Carotid stenosis 05/17/2010    Priority: Medium  . HLD (hyperlipidemia) 08/29/2009    Priority: Medium  . Essential hypertension 04/14/2007    Priority: Medium  . Osteopenia 04/14/2007    Priority: Medium  . Allergic rhinitis 09/22/2015    Priority: Low  . Adenomatous colon polyp     Priority: Low  . Herpes simplex virus (HSV) infection 01/16/2010    Priority: Low  . Genetic testing 02/10/2018  . Family history of breast cancer     Medications- reviewed and updated Current Outpatient Medications  Medication Sig Dispense Refill  . Albuterol Sulfate (PROAIR RESPICLICK) 882 (90 Base)  MCG/ACT AEPB Inhale 1 puff into the lungs every 6 (six) hours as needed (wheezing or shortness of breath). 1 each 3  . aspirin 81 MG tablet Take 81 mg by mouth daily.      . cetirizine (ZYRTEC) 10 MG tablet Take 10 mg by mouth daily as needed for allergies.     Marland Kitchen desvenlafaxine (PRISTIQ) 100 MG 24 hr tablet TAKE 1 TABLET BY MOUTH ONCE DAILY 90 tablet 1  . DEXILANT 60 MG capsule TAKE 1 CAPSULE BY MOUTH ONCE DAILY 90 capsule 1  . fluticasone (FLONASE) 50 MCG/ACT nasal spray Place 2 sprays into both nostrils daily. 16 g 1  . gabapentin (NEURONTIN) 300 MG capsule Take 300 mg by mouth 3 (three) times daily.    . hydrochlorothiazide (HYDRODIURIL) 12.5 MG tablet TAKE 1 TABLET BY MOUTH ONCE DAILY 30 tablet 5  . LORazepam (ATIVAN) 0.5 MG tablet TAKE 1 TABLET BY MOUTH TWICE DAILY AS NEEDED FOR ANXIETY. DON'T DRIVE 8 HOURS AFTER USING 60 tablet 1  . Multiple Vitamins-Minerals (MULTIVITAMIN ADULT PO) Take 1 tablet by mouth daily.     . nitroGLYCERIN (NITROSTAT) 0.4 MG SL tablet Place 0.4 mg under the tongue every 5 (five) minutes as needed for chest pain.    Marland Kitchen propranolol (INDERAL) 20 MG tablet TAKE 1 TABLET BY MOUTH TWICE DAILY 60 tablet 5  . rosuvastatin (CRESTOR) 20 MG tablet TAKE 1 TABLET BY MOUTH DAILY 90 tablet 3  .  sertraline (ZOLOFT) 50 MG tablet TAKE 1 TABLET BY MOUTH ONCE DAILY 90 tablet 1  . SPIRIVA HANDIHALER 18 MCG inhalation capsule INHALE 1 PUFF BY MOUTH ONCE DAILY 90 capsule 1  . SYMBICORT 160-4.5 MCG/ACT inhaler INHALE 2 PUFFS BY MOUTH TWICE DAILY 16 g 11  .      Marland Kitchen       No current facility-administered medications for this visit.     Objective: BP 134/88   Pulse 68   Temp 98.6 F (37 C) (Oral)   Ht 5\' 3"  (1.6 m)   Wt 158 lb 6.4 oz (71.8 kg)   SpO2 97%   BMI 28.06 kg/m  Gen: NAD, resting comfortably HEENT: Turbinates erythematous with yellow drainage- some dried blood also noted, TM normal, pharynx mildly erythematous with no tonsilar exudate or edema, frontal and maxillary  sinus tenderness bilaterally CV: RRR no murmurs rubs or gallops Lungs: Mild wheezes noted.  No crackles or rhonchi. Ext: no edema Skin: warm, dry, no rash Neuro: grossly normal, moves all extremities Psych: Tearful when discussing loss of daughter  Assessment/Plan:  COPD exacerbation (HCC) Acute frontal and maxillary sinusitis S: presents with sinusitis symptoms including nasal congestion, sinus tenderness -other symptoms include: She is also noted more wheezing and shortness of breath from baseline.  This is at least day 3 of symptoms.  Symptoms seem to be worsening. -previous treatments: Mucinex -sick contacts/travel/risks: denies flu exposure.  A/P: From AVS:  " Appears you have a sinus infection- under 10 days these tend to be viral.  Lets try prednisone as an anti-inflammatory to help calm down the inflammation associated with the sinus infection.  Also appears your COPD has flared up and the prednisone should help with that as well.  COPD flareups are more likely to be bacterial-so I have printed doxycycline antibiotic for you to take if you are not improving within 2 to 3 days-this would also address any bacterial element with the sinus infection-if it was turning into a bacterial infection  See [place] back for fever, chills, progressive shortness of breath or wheezing, failure to improve with treatments "  Future Appointments  Date Time Provider [place]  04/15/2019  1:00 PM [facility]   Meds ordered this encounter  Medications  . predniSONE (DELTASONE) 20 MG tablet    Sig: Take 2 pills for 3 days, 1 pill for 4 days    Dispense:  10 tablet    Refill:  0  . doxycycline (VIBRA-TABS) 100 MG tablet    Sig: Take 1 tablet (100 mg total) by mouth 2 (two) times daily for 7 days. Take if symptoms worsen or fail to improve in 2-3 days    Dispense:  14 tablet    Refill:  0   Return precautions advised.  Garret Reddish, MD

## 2018-08-04 ENCOUNTER — Other Ambulatory Visit: Payer: Self-pay | Admitting: Family Medicine

## 2018-08-28 ENCOUNTER — Other Ambulatory Visit: Payer: Self-pay | Admitting: Family Medicine

## 2018-09-04 ENCOUNTER — Other Ambulatory Visit: Payer: Self-pay | Admitting: Family Medicine

## 2018-09-15 ENCOUNTER — Other Ambulatory Visit: Payer: Self-pay | Admitting: Family Medicine

## 2018-09-28 ENCOUNTER — Other Ambulatory Visit: Payer: Self-pay | Admitting: Family Medicine

## 2018-10-27 ENCOUNTER — Telehealth: Payer: Self-pay | Admitting: Family Medicine

## 2018-10-27 NOTE — Telephone Encounter (Signed)
Yes, ok to schedule on another day.

## 2018-10-27 NOTE — Telephone Encounter (Signed)
Please advise is it ok to put her in a same day slot another day this week? Copied from [place] [id number]. Topic: Appointment Scheduling - Scheduling Inquiry for Clinic >> Oct 27, 2018  8:30 AM Nils Flack wrote: Reason for CRM: pt would like to be worked in for appt for left ear pain.  She declined to see other provider, wants Dr Yong Channel only.  I offered appts tomorrow, thatdoes not work for her schedule. Please call [phone]

## 2018-10-28 NOTE — Telephone Encounter (Signed)
Talked to the patient and tried to see if she would see another provider but she declined again to see another provider.Patient is scheduled for 10/30/18 @ 1pm.

## 2018-10-30 ENCOUNTER — Encounter: Payer: Self-pay | Admitting: Family Medicine

## 2018-10-30 ENCOUNTER — Other Ambulatory Visit: Payer: Self-pay | Admitting: Family Medicine

## 2018-10-30 ENCOUNTER — Ambulatory Visit (INDEPENDENT_AMBULATORY_CARE_PROVIDER_SITE_OTHER): Payer: PPO | Admitting: Family Medicine

## 2018-10-30 VITALS — BP 130/70 | HR 60 | Temp 97.8°F | Ht 63.0 in | Wt 156.8 lb

## 2018-10-30 DIAGNOSIS — D490 Neoplasm of unspecified behavior of digestive system: Secondary | ICD-10-CM | POA: Diagnosis not present

## 2018-10-30 DIAGNOSIS — I10 Essential (primary) hypertension: Secondary | ICD-10-CM

## 2018-10-30 DIAGNOSIS — F331 Major depressive disorder, recurrent, moderate: Secondary | ICD-10-CM

## 2018-10-30 MED ORDER — HYDROCHLOROTHIAZIDE 12.5 MG PO TABS: 12.5000 mg | ORAL_TABLET | Freq: Every day | ORAL | 3 refills | Status: DC

## 2018-10-30 NOTE — Progress Notes (Signed)
Phone [phone]   Subjective:  CORNELLA EMMER is a 68 y.o. year old very pleasant female patient who presents for/with See problem oriented charting ROS- some headaches- this is common for her during stressful times. No chest pain. Shortness of breath is stable and having some wheezing- using albuterol and that seems to help. Some depressed mood. She confirms on PHQ9 0 no suicidal thoughts- thoughts of being better off dead to see daughter again but would not follow through with any action to get to that point  Past Medical History-  Patient Active Problem List   Diagnosis Date Noted  . 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 Depression 09/22/2015    Priority: High  . Former smoker 09/22/2015    Priority: High  . S/P AVR (aortic valve replacement) 11/06/2013    Priority: High  . Aortic stenosis 07/07/2010    Priority: High  . CAD (coronary artery disease) 05/17/2010    Priority: High  . Hot flashes 03/22/2016    Priority: Medium  . Diastolic dysfunction 85/27/7824    Priority: Medium  . GERD (gastroesophageal reflux disease) 06/04/2014    Priority: Medium  . Carotid stenosis 05/17/2010    Priority: Medium  . HLD (hyperlipidemia) 08/29/2009    Priority: Medium  . Essential hypertension 04/14/2007    Priority: Medium  . Osteopenia 04/14/2007    Priority: Medium  . Allergic rhinitis 09/22/2015    Priority: Low  . Adenomatous colon polyp     Priority: Low  . Herpes simplex virus (HSV) infection 01/16/2010    Priority: Low  . Moderate episode of recurrent major depressive disorder ([place]) 10/30/2018  . Genetic testing 02/10/2018  . Family history of breast cancer     Medications- reviewed and updated Current Outpatient Medications  Medication Sig Dispense Refill  . Albuterol Sulfate (PROAIR RESPICLICK) 235 (90 Base) MCG/ACT AEPB Inhale 1 puff into the lungs every 6 (six) hours as needed (wheezing or shortness of breath). 1 each 3  . aspirin 81 MG tablet  Take 81 mg by mouth daily.      . cetirizine (ZYRTEC) 10 MG tablet Take 10 mg by mouth daily as needed for allergies.     Marland Kitchen desvenlafaxine (PRISTIQ) 100 MG 24 hr tablet TAKE 1 TABLET BY MOUTH ONCE DAILY 90 tablet 1  . DEXILANT 60 MG capsule TAKE 1 CAPSULE BY MOUTH ONCE DAILY 90 capsule 1  . fluticasone (FLONASE) 50 MCG/ACT nasal spray Place 2 sprays into both nostrils daily. 16 g 1  . gabapentin (NEURONTIN) 300 MG capsule Take 300 mg by mouth 3 (three) times daily.    . hydrochlorothiazide (HYDRODIURIL) 12.5 MG tablet Take 1 tablet (12.5 mg total) by mouth daily. 90 tablet 3  . LORazepam (ATIVAN) 0.5 MG tablet TAKE 1 TABLET BY MOUTH TWICE DAILY AS NEEDED FOR ANXIETY. DON'T DRIVE 8 HOURS AFTER USING 60 tablet 1  . Multiple Vitamins-Minerals (MULTIVITAMIN ADULT PO) Take 1 tablet by mouth daily.     . nitroGLYCERIN (NITROSTAT) 0.4 MG SL tablet Place 0.4 mg under the tongue every 5 (five) minutes as needed for chest pain.    . predniSONE (DELTASONE) 20 MG tablet Take 2 pills for 3 days, 1 pill for 4 days 10 tablet 0  . propranolol (INDERAL) 20 MG tablet TAKE 1 TABLET BY MOUTH TWICE DAILY 60 tablet 0  . rosuvastatin (CRESTOR) 20 MG tablet TAKE 1 TABLET BY MOUTH ONCE DAILY 90 tablet 3  . sertraline (ZOLOFT) 50  MG tablet TAKE 1 TABLET BY MOUTH ONCE DAILY 90 tablet 1  . SPIRIVA HANDIHALER 18 MCG inhalation capsule INHALE 1 PUFF BY MOUTH ONCE DAILY 90 capsule 1  . SYMBICORT 160-4.5 MCG/ACT inhaler INHALE 2 PUFFS BY MOUTH TWICE DAILY 16 g 11   No current facility-administered medications for this visit.      Objective:  BP 130/70 (BP Location: Right Arm, Patient Position: Sitting, Cuff Size: Normal)   Pulse 60   Temp 97.8 F (36.6 C) (Oral)   Ht 5\' 3"  (1.6 m)   Wt 156 lb 12.8 oz (71.1 kg)   SpO2 96%   BMI 27.78 kg/m  Gen: NAD, resting comfortably Visible bulge over left parotid gland approximately 2 x 2 cm CV: RRR no murmurs rubs or gallops.  Prolonged expiratory phase Lungs: CTAB no crackles,  wheeze, rhonchi Ext: no edema Skin: warm, dry Psych: Tearful at multiple times    Assessment and Plan   #Hypertension S: controlled on propranolol 20 mg twice daily, hydrochlorothiazide 12.5 mg. BP Readings from Last 3 Encounters:  10/30/18 130/70  07/24/18 134/88  07/07/18 132/86  A/P: Stable.  At blood pressure goal of <140/90. Continue current meds: Refilled hydrochlorothiazide today   # Depression S: PHQ9 remains elevated despite pristiq and zoloft. Doing counseling. Back in church now. Using sparing ativan- only used recently on 4 month anniversary of losing daughter.    Office Visit from 10/30/2018 in [place]  PHQ-9 Total Score  18    A/P: I told patient I was really proud of the progress she was making- phq9 remains elevated largely due to grieving.  We will continue current medications.  I would like to stop her Zoloft but not during this present time.  Continue counseling.  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 Had seen Dr. [name] (states not the best fit for her). She feels like this area has grown recently - shes not exactly sure when as has had traumatic event with loss of daughter [name] back last year.   A/P: Patient is willing to go back to ENT. Has heard good things about Dr. Benjamine Mola and would like to visit with him to discuss options/potential surgery   Future Appointments  Date Time Provider [place]  12/15/2018  9:15 AM Richardson Dopp T, PA-C [facility]  04/15/2019  1:00 PM [facility]   4 to 26-month follow-up recommended  Lab/Order associations: Essential hypertension  Tumor of parotid gland - Plan: Ambulatory referral to ENT  Moderate episode of recurrent major depressive disorder ([place]), Chronic  Meds ordered this encounter  Medications  . hydrochlorothiazide (HYDRODIURIL) 12.5 MG tablet    Sig:  Take 1 tablet (12.5 mg total) by mouth daily.    Dispense:  90 tablet    Refill:  3    Return precautions advised.  Garret Reddish, MD

## 2018-10-30 NOTE — Assessment & Plan Note (Signed)
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 Had seen Dr. [name] (states not the best fit for her). She feels like this area has grown recently - shes not exactly sure when as has had traumatic event with loss of daughter [name] back last year.   A/P: Patient is willing to go back to ENT. Has heard good things about Dr. Benjamine Mola and would like to visit with him to discuss options/potential surgery

## 2018-10-30 NOTE — Patient Instructions (Addendum)
Health Maintenance Due  Topic Date Due  . MAMMOGRAM- please get this scheduled 12/06/2017   We will call you within two weeks about your referral to ENT Dr. Melene Plan. If you do not hear within 3 weeks, give [place] a call.   No other changes today  4-6 months for check in

## 2018-10-31 NOTE — Telephone Encounter (Signed)
Rx request 

## 2018-11-03 ENCOUNTER — Other Ambulatory Visit: Payer: Self-pay | Admitting: Family Medicine

## 2018-11-16 ENCOUNTER — Other Ambulatory Visit: Payer: Self-pay | Admitting: Family Medicine

## 2018-11-26 ENCOUNTER — Other Ambulatory Visit: Payer: Self-pay | Admitting: Family Medicine

## 2018-12-08 ENCOUNTER — Telehealth: Payer: Self-pay | Admitting: *Deleted

## 2018-12-08 NOTE — Progress Notes (Signed)
Virtual Visit via Video Note   This visit type was conducted due to national recommendations for restrictions regarding the COVID-19 Pandemic (e.g. social distancing) in an effort to limit this patient's exposure and mitigate transmission in our community.  Due to her co-morbid illnesses, this patient is at least at moderate risk for complications without adequate follow up.  This format is felt to be most appropriate for this patient at this time.  All issues noted in this document were discussed and addressed.  A limited physical exam was performed with this format.  Please refer to the patient's chart for her consent to telehealth for [facility].   Evaluation Performed:  Follow-up visit  Date:  12/09/2018   ID:  Katherine, Foley Apr 01, 1951, MRN [id number]  Patient Location: Home  Provider Location: Home  PCP:  Marin Olp, MD  Cardiologist:  Sherren Mocha, MD   Electrophysiologist:  None   Chief Complaint:  FU on coronary artery disease and aortic valve disease   History of Present Illness:    Katherine Foley is a 68 y.o. female who presents via audio/video conferencing for a telehealth visit today.    She has coronary artery diseaseand aortic valve disease. She had a NSTEMI in 2011 treated with PCI of the RCA. She also underwent PCI of the RI in 2012. She underwent a bioprosthetic AVR + CABG with a L-LAD in 2012.  Cardiac Catheterization in 9/19 demonstrated patent stents in the RI and RCA.  The LIMA was known to be atretic and there was just mild disease in the mid LAD.  Medical therapy was recommended.  She was last seen in 06/2018.    Today, she is doing ok.  Her daughter passed away 5 mos ago from cancer.  She is still mourning the loss of her.  She has not had chest pain, paroxysmal nocturnal dyspnea, syncope, swelling. She has asthma/COPD and has a chronic cough and dyspnea.  She has not had any significant changes.    The patient does not have symptoms concerning  for COVID-19 infection (fever, chills, cough, or new shortness of breath).    Past Medical History:  Diagnosis Date  . Adenomatous colon polyp   . ANXIETY DEPRESSION 02/15/2009  . AORTIC STENOSIS 07/07/2010   a. echo 12/23/10: EF 60-65%, mod to severe AS with mean gradient 29 mmHg;  b. 04/2011 s/p bioprosthetic AVR. // Echo 9/19:  Mild conc LVH, EF 60-65, no RWMA, Gr 1 DD, AVR ok (mean 10, peak 21), trivial MR, normal RVSF, mild TR   . Barrett's esophagus 04/14/2007  . Barrett's esophagus 2000  . CAD, NATIVE VESSEL 05/17/2010   a. NSTEMI 8/11 - BMS to RCA;  b. cath 4/12: dLAD 50%, RI 80% (PCI), AVCFX 30%, pRCA 30%, stent ok, 40-50, then 50% dist RCA;  c. s/p Promus DES to RI 12/25/10;  d. 04/2011 CABG x 1 (LIMA->LAD) @ time of AVR  . Carotid stenosis 05/17/2010   a. dopplers 27/03: RICA 50-09%; LICA 3-81% (follow up due 11/12)  . Chronic bronchitis ([place])    "multiple times" (03/25/2013)  . 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 Exertional shortness of breath    "related to COPD problems" (03/25/2013)  . Family history of breast cancer    2 daughters; PALB2 mutation in family  . Genetic testing 02/10/2018   PALB2 analysis at [facility] - Negative for familial mutation  . GERD (gastroesophageal reflux disease)   . H/O hiatal hernia   .  Hemorrhoid thrombosis    "had it lanced" (03/25/2013)  . Herpes simplex without mention of complication 0/25/8527  . History of blood transfusion    'w/OHS" (03/25/2013)  . [place], MILD 08/29/2009  . HYPERTENSION 04/14/2007  . MIGRAINE HEADACHE 04/20/2008  . Myocardial infarction [facility]) 2011   "during catherization" (03/25/2013)  . OSTEOPENIA 04/14/2007  . Ovarian cyst   . Pneumonia    "multiple times" (03/25/2013)  . URINARY INCONTINENCE, STRESS, MILD 08/26/2007  . Urine, incontinence, stress female    Past Surgical History:  Procedure Laterality Date  . AORTIC VALVE REPLACEMENT  2012  . APPENDECTOMY    . BLEPHAROPLASTY Bilateral ~  1998  . CARDIAC CATHETERIZATION N/A 07/06/2016   Procedure: Left Heart Cath and Cors/Grafts Angiography;  Surgeon: Sherren Mocha, MD;  Location: [place] CV LAB;  Service: Cardiovascular;  Laterality: N/A;  . CHOLECYSTECTOMY N/A 03/25/2013   Procedure: LAPAROSCOPIC CHOLECYSTECTOMY WITH INTRAOPERATIVE CHOLANGIOGRAM;  Surgeon: Imogene Burn. Georgette Dover, MD;  Location: [place];  Service: General;  Laterality: N/A;  . CORONARY ANGIOGRAPHY N/A 05/26/2018   Procedure: CORONARY ANGIOGRAPHY (CATH LAB);  Surgeon: Jettie Booze, MD;  Location: [place] CV LAB;  Service: Cardiovascular;  Laterality: N/A;  . CORONARY ANGIOPLASTY WITH STENT PLACEMENT  2010   "I have 2 stents" (03/25/2013)  . NASAL SINUS SURGERY  1997; 1998   "cust in maxillary sinus; put a stent in then removed it" (03/25/2013)  . OVARIAN CYST REMOVAL Right 2011   "size of a soccer ball" (03/25/2013)  . thrombosed hemorrohid lanced    . TONSILLECTOMY AND ADENOIDECTOMY    . TOTAL ABDOMINAL HYSTERECTOMY       Current Meds  Medication Sig  . Albuterol Sulfate (PROAIR RESPICLICK) 782 (90 Base) MCG/ACT AEPB Inhale 1 puff into the lungs every 6 (six) hours as needed (wheezing or shortness of breath).  Marland Kitchen aspirin 81 MG tablet Take 81 mg by mouth daily.    . budesonide-formoterol (SYMBICORT) 160-4.5 MCG/ACT inhaler Inhale 2 puffs into the lungs daily.  . cetirizine (ZYRTEC) 10 MG tablet Take 10 mg by mouth daily as needed for allergies.   Marland Kitchen desvenlafaxine (PRISTIQ) 100 MG 24 hr tablet Take 1 tablet by mouth once daily  . DEXILANT 60 MG capsule TAKE 1 CAPSULE BY MOUTH ONCE DAILY  . fluticasone (FLONASE) 50 MCG/ACT nasal spray Place 2 sprays into both nostrils daily.  Marland Kitchen gabapentin (NEURONTIN) 300 MG capsule Take 300 mg by mouth daily.   . hydrochlorothiazide (HYDRODIURIL) 12.5 MG tablet Take 1 tablet by mouth once daily  . LORazepam (ATIVAN) 0.5 MG tablet TAKE 1 TABLET BY MOUTH TWICE DAILY AS NEEDED FOR ANXIETY. DON'T DRIVE 8 HOURS AFTER USING  .  Multiple Vitamins-Minerals (MULTIVITAMIN ADULT PO) Take 1 tablet by mouth daily.   . nitroGLYCERIN (NITROSTAT) 0.4 MG SL tablet Place 0.4 mg under the tongue every 5 (five) minutes as needed for chest pain.  Marland Kitchen propranolol (INDERAL) 20 MG tablet Take 1 tablet by mouth twice daily  . rosuvastatin (CRESTOR) 20 MG tablet TAKE 1 TABLET BY MOUTH ONCE DAILY  . sertraline (ZOLOFT) 50 MG tablet TAKE 1 TABLET BY MOUTH ONCE DAILY  . SPIRIVA HANDIHALER 18 MCG inhalation capsule INHALE 1 PUFF BY MOUTH ONCE DAILY  . [DISCONTINUED] SYMBICORT 160-4.5 MCG/ACT inhaler INHALE 2 PUFFS BY MOUTH TWICE DAILY (Patient taking differently: Inhale 2 puffs into the lungs daily. )     Allergies:   Codeine sulfate and Hydrocodone-acetaminophen   Social History   Tobacco Use  .  Smoking status: Former Smoker    Packs/day: 1.50    Years: 44.00    Pack years: 66.00    Types: Cigarettes    Last attempt to quit: 09/03/2005    Years since quitting: 13.2  . Smokeless tobacco: Never Used  Substance Use Topics  . Alcohol use: Not Currently    Alcohol/week: 0.0 standard drinks  . Drug use: No     Family Hx: The patient's family history includes Asthma in her son; Breast cancer in her daughter; Breast cancer (age of onset: 65) in her daughter; COPD in her father; Cancer in an other family member; Cirrhosis in her sister; Coronary artery disease in an other family member; Heart disease in her father, maternal uncle, mother, and another family member; Rheum arthritis in her father; Stroke in her brother. There is no history of Colon cancer or Colon polyps.  ROS:   Please see the history of present illness.    All other systems reviewed and are negative.   Prior CV studies:   The following studies were reviewed today:  Cardiac Catheterization 05/26/18 LAD mid 32 RI stent patent LCx mid 25 RCA ost 30, prox stent patent with 25 ISR, dist 25 LIMA known to be atretic      Echo 05/23/18 Mild conc LVH, EF 60-65, no RWMA,  Gr 1 DD, s/p AVR without stenosis or significant regurgitation (peak 21), no RVSF, trivial MR, mild TR   Echo 12/26/16 EF 65-70, no RWMA, Gr 1 DD, AVR ok with mean 11, trivial TR   Cardiac Catheterization 07/06/16 LM ok LAD mid 50 RI stent patent PCx prox 30 RCA mid stent patent with 30 ISR L-LAD atretic    Echo 12/02/15 EF 65-70, no RWMA, Gr 1 DD, normally functioning AVR (mean 16), trivial MR, trivial TR   Carotid US 12/02/15  1-39% bilateral ICA stenosis. FU as needed    Nuclear stress test 12/02/15 EF 70.  Normal pharmacologic nuclear stress test with no evidence of prior infarct or ischemia.    Labs/Other Tests and Data Reviewed:    EKG:  An ECG dated 10.14.19 was personally reviewed today and demonstrated:  NSR, HR 65, TW inversions in V4-5, QTc 405  Recent Labs: 05/21/2018: BUN 9; Creatinine, Ser 0.63; Hemoglobin 12.5; Platelets 222; Potassium 4.8; Sodium 132   Recent Lipid Panel Lab Results  Component Value Date/Time   CHOL 194 03/20/2017 08:22 AM   TRIG 138.0 03/20/2017 08:22 AM   HDL 73.70 03/20/2017 08:22 AM   CHOLHDL 3 03/20/2017 08:22 AM   LDLCALC 92 03/20/2017 08:22 AM   LDLDIRECT 65.0 06/19/2017 09:01 AM    Wt Readings from Last 3 Encounters:  12/09/18 155 lb (70.3 kg)  10/30/18 156 lb 12.8 oz (71.1 kg)  07/24/18 158 lb 6.4 oz (71.8 kg)     Objective:    Vital Signs:  BP (!) 169/96   Pulse 78   Ht 5' 2.5" (1.588 m)   Wt 155 lb (70.3 kg)   BMI 27.90 kg/m    GEN: Well nourished, well developed female in no acute distress. EYES: anicteric RESP:  No labored breathing observed NEURO:  Alert, oriented Psych: She seems somber at times  ASSESSMENT & PLAN:    Coronary artery disease involving native coronary artery of native heart without angina pectoris Status post prior stenting to the RCA and ramus intermediate and subsequent single-vessel CABG in 2012.  Cardiac catheterization in 9/19 demonstrated patent stent in the ramus intermedius and RCA.  LIMA is known to be atretic and she has mid 30% stenosis in the LAD. She is doing well without angina.  Continue ASA, statin.  S/P AVR (aortic valve replacement) Stable by most recent Echo.  Continue SBE prophylaxis.  Essential hypertension BP above target. She did repeat her BP and it was 144/84.  I have asked her to track her BP for 2 weeks and send me the readings.  Consider increasing the Propranolol or adding Amlodipine if BP remains above target.  We discussed limiting salt.   Hyperlipidemia, unspecified hyperlipidemia type Most recent LDL optimal.  We will plan on obtaining follow-up fasting lipids and LFTs when we are able to bring her back into the office.  Continue rosuvastatin.  Educated About Covid-19 Virus Infection The signs and symptoms of COVID-19 were discussed with the patient and how to seek care for testing (follow up with PCP or arrange E-visit).  The importance of social distancing was discussed today.  Time:   Today, I have spent 15 minutes with the patient with telehealth technology discussing the above problems.     Medication Adjustments/Labs and Tests Ordered: Current medicines are reviewed at length with the patient today.  Concerns regarding medicines are outlined above.  Tests Ordered: No orders of the defined types were placed in this encounter.  Medication Changes: No orders of the defined types were placed in this encounter.   Disposition:  Follow up in 6 month(s)  Signed, Richardson Dopp, PA-C  12/09/2018 11:03 AM    [facility]

## 2018-12-08 NOTE — Telephone Encounter (Signed)
    TELEPHONE CALL NOTE  Katherine Foley has been deemed a candidate for a follow-up tele-health visit to limit community exposure during the Covid-19 pandemic. I spoke with the patient via phone to ensure availability of phone/video source, confirm preferred email & phone number, and discuss instructions and expectations.  I reminded Katherine Foley to be prepared with any vital sign and/or heart rhythm information that could potentially be obtained via home monitoring, at the time of her visit. I reminded Katherine Foley to expect a phone call at the time of her visit if her visit.  Did the patient verbally acknowledge consent to treatment?  Darlyn Chamber, CMA 12/08/2018

## 2018-12-09 ENCOUNTER — Telehealth: Payer: Self-pay | Admitting: *Deleted

## 2018-12-09 ENCOUNTER — Other Ambulatory Visit: Payer: Self-pay

## 2018-12-09 ENCOUNTER — Encounter: Payer: Self-pay | Admitting: Physician Assistant

## 2018-12-09 ENCOUNTER — Telehealth (INDEPENDENT_AMBULATORY_CARE_PROVIDER_SITE_OTHER): Payer: PPO | Admitting: Physician Assistant

## 2018-12-09 VITALS — BP 169/96 | HR 78 | Ht 62.5 in | Wt 155.0 lb

## 2018-12-09 DIAGNOSIS — I2510 Atherosclerotic heart disease of native coronary artery without angina pectoris: Secondary | ICD-10-CM | POA: Diagnosis not present

## 2018-12-09 DIAGNOSIS — I10 Essential (primary) hypertension: Secondary | ICD-10-CM

## 2018-12-09 DIAGNOSIS — Z952 Presence of prosthetic heart valve: Secondary | ICD-10-CM

## 2018-12-09 DIAGNOSIS — Z7189 Other specified counseling: Secondary | ICD-10-CM

## 2018-12-09 DIAGNOSIS — E785 Hyperlipidemia, unspecified: Secondary | ICD-10-CM

## 2018-12-09 NOTE — Telephone Encounter (Signed)
Virtual Visit Pre-Appointment Phone Call  Steps For Call:  1. Confirm consent - "In the setting of the current Covid19 crisis, you are scheduled for a (phone or video) visit with your provider on (date) at (time).  Just as we do with many in-office visits, in order for you to participate in this visit, we must obtain consent.  If you'd like, I can send this to your mychart (if signed up) or email for you to review.  Otherwise, I can obtain your verbal consent now.  All virtual visits are billed to your insurance company just like a normal visit would be.  By agreeing to a virtual visit, we'd like you to understand that the technology does not allow for your provider to perform an examination, and thus may limit your provider's ability to fully assess your condition.  Finally, though the technology is pretty good, we cannot assure that it will always work on either your or our end, and in the setting of a video visit, we may have to convert it to a phone-only visit.  In either situation, we cannot ensure that we have a secure connection.  Are you willing to proceed?"  2. Give patient instructions for WebEx download to smartphone as below if video visit  3. Advise patient to be prepared with any vital sign or heart rhythm information, their current medicines, and a piece of paper and pen handy for any instructions they may receive the day of their visit  4. Inform patient they will receive a phone call 15 minutes prior to their appointment time (may be from unknown caller ID) so they should be prepared to answer  5. Confirm that appointment type is correct in Epic appointment notes (video vs telephone)    TELEPHONE CALL NOTE  Katherine Foley has been deemed a candidate for a follow-up tele-health visit to limit community exposure during the Covid-19 pandemic. I spoke with the patient via phone to ensure availability of phone/video source, confirm preferred email & phone number, and discuss  instructions and expectations.  I reminded Katherine Foley to be prepared with any vital sign and/or heart rhythm information that could potentially be obtained via home monitoring, at the time of her visit. I reminded Katherine Foley to expect a phone call at the time of her visit if her visit.  Did the patient verbally acknowledge consent to treatment? Yes   [place], [place] 12/09/2018 9:07 AM   DOWNLOADING THE WEBEX SOFTWARE TO SMARTPHONE  - If Apple, go to [employer] and type in WebEx in the search bar. [place] [employer], the blue/green circle. The app is free but as with any other app downloads, their phone may require them to verify saved payment information or Apple password. The patient does NOT have to create an account.  - If Android, ask patient to go to [employer] and type in WebEx in the search bar. [place] [employer], the blue/green circle. The app is free but as with any other app downloads, their phone may require them to verify saved payment information or Android password. The patient does NOT have to create an account.   CONSENT FOR TELE-HEALTH VISIT - PLEASE REVIEW  I hereby voluntarily request, consent and authorize [facility] and its employed or contracted physicians, physician assistants, nurse practitioners or other licensed health care professionals (the Practitioner), to provide me with telemedicine health care services (the "Services") as deemed necessary by the treating Practitioner. I  acknowledge and consent to receive the Services by the Practitioner via telemedicine. I understand that the telemedicine visit will involve communicating with the Practitioner through live audiovisual communication technology and the disclosure of certain medical information by electronic transmission. I acknowledge that I have been given the opportunity to request an in-person assessment or other available alternative prior to the telemedicine visit and am  voluntarily participating in the telemedicine visit.  I understand that I have the right to withhold or withdraw my consent to the use of telemedicine in the course of my care at any time, without affecting my right to future care or treatment, and that the Practitioner or I may terminate the telemedicine visit at any time. I understand that I have the right to inspect all information obtained and/or recorded in the course of the telemedicine visit and may receive copies of available information for a reasonable fee.  I understand that some of the potential risks of receiving the Services via telemedicine include:  Marland Kitchen Delay or interruption in medical evaluation due to technological equipment failure or disruption; . Information transmitted may not be sufficient (e.g. poor resolution of images) to allow for appropriate medical decision making by the Practitioner; and/or  . In rare instances, security protocols could fail, causing a breach of personal health information.  Furthermore, I acknowledge that it is my responsibility to provide information about my medical history, conditions and care that is complete and accurate to the best of my ability. I acknowledge that Practitioner's advice, recommendations, and/or decision may be based on factors not within their control, such as incomplete or inaccurate data provided by me or distortions of diagnostic images or specimens that may result from electronic transmissions. I understand that the practice of medicine is not an exact science and that Practitioner makes no warranties or guarantees regarding treatment outcomes. I acknowledge that I will receive a copy of this consent concurrently upon execution via email to the email address I last provided but may also request a printed copy by calling the office of [place].    I understand that my insurance will be billed for this visit.   I have read or had this consent read to me. . I understand the  contents of this consent, which adequately explains the benefits and risks of the Services being provided via telemedicine.  . I have been provided ample opportunity to ask questions regarding this consent and the Services and have had my questions answered to my satisfaction. . I give my informed consent for the services to be provided through the use of telemedicine in my medical care  By participating in this telemedicine visit I agree to the above.

## 2018-12-09 NOTE — Patient Instructions (Signed)
Medication Instructions:  No changes  If you need a refill on your cardiac medications before your next appointment, please call your pharmacy.   Lab work: None   If you have labs (blood work) drawn today and your tests are completely normal, you will receive your results only by: Marland Kitchen MyChart Message (if you have MyChart) OR . A paper copy in the mail If you have any lab test that is abnormal or we need to change your treatment, we will call you to review the results.  Testing/Procedures: None   Follow-Up: At [facility], you and your health needs are our priority.  As part of our continuing mission to provide you with exceptional heart care, we have created designated Provider Care Teams.  These Care Teams include your primary Cardiologist (physician) and Advanced Practice Providers (APPs -  Physician Assistants and Nurse Practitioners) who all work together to provide you with the care you need, when you need it. You will need a follow up appointment in:  6 months.  Please call our office 2 months in advance to schedule this appointment.  You may see Sherren Mocha, MD or Richardson Dopp, PA-C   Any Other Special Instructions Will Be Listed Below (If Applicable). 1. Check your blood pressure 1-2 times a day and record 2. After 2 weeks, send those readings to me through [place]

## 2018-12-10 ENCOUNTER — Other Ambulatory Visit: Payer: Self-pay | Admitting: Family Medicine

## 2018-12-15 ENCOUNTER — Ambulatory Visit: Payer: PPO | Admitting: Physician Assistant

## 2019-01-09 DIAGNOSIS — D37030 Neoplasm of uncertain behavior of the parotid salivary glands: Secondary | ICD-10-CM | POA: Diagnosis not present

## 2019-01-13 ENCOUNTER — Other Ambulatory Visit: Payer: Self-pay | Admitting: Family Medicine

## 2019-01-15 ENCOUNTER — Telehealth: Payer: Self-pay | Admitting: Family Medicine

## 2019-01-15 MED ORDER — DESVENLAFAXINE SUCCINATE ER 100 MG PO TB24: 100.0000 mg | ORAL_TABLET | Freq: Every day | ORAL | 3 refills | Status: DC

## 2019-01-15 NOTE — Telephone Encounter (Signed)
Not sure why we gave only # 30 last time or why this ended up getting denied.  I refilled that for patient- patient should not run out of antidepressant-even if we only give patient 30 tablets and then asked me about the medication.   I left a voicemail letting patient know-please follow-up tomorrow to make sure she got this

## 2019-01-15 NOTE — Telephone Encounter (Signed)
Rx refill Last fill 12/11/18  #30/0 Last ov 10/30/18

## 2019-01-15 NOTE — Telephone Encounter (Signed)
Copied from [place]. Topic: General - Other >> Jan 15, 2019 12:35 PM Leward Quan A wrote: Reason for CRM: Patient called to say that she requested a refill on desvenlafaxine (PRISTIQ) 100 MG 24 hr tablet but it was denied by Dr Yong Channel and she would like a call back with an explanation please. Also stated that she is all out and can't just stop cold [place]. Ph#  [phone]

## 2019-01-16 NOTE — Telephone Encounter (Signed)
Noted! Lm for pt to return phone call if she has any issues.

## 2019-01-21 ENCOUNTER — Other Ambulatory Visit: Payer: Self-pay | Admitting: Otolaryngology

## 2019-01-21 DIAGNOSIS — K118 Other diseases of salivary glands: Secondary | ICD-10-CM

## 2019-01-28 ENCOUNTER — Other Ambulatory Visit: Payer: Self-pay

## 2019-01-28 ENCOUNTER — Ambulatory Visit
Admission: RE | Admit: 2019-01-28 | Discharge: 2019-01-28 | Disposition: A | Discharge status: Home / Self Care | Payer: PPO | Source: Ambulatory Visit | Attending: Otolaryngology | Admitting: Otolaryngology

## 2019-01-28 DIAGNOSIS — K118 Other diseases of salivary glands: Secondary | ICD-10-CM | POA: Diagnosis not present

## 2019-01-28 MED ORDER — IOPAMIDOL (ISOVUE-300) INJECTION 61%
75.0000 mL | Freq: Once | INTRAVENOUS | Status: AC | PRN
  Administered 2019-01-28: 12:00:00 75 mL via INTRAVENOUS

## 2019-02-03 DIAGNOSIS — D37030 Neoplasm of uncertain behavior of the parotid salivary glands: Secondary | ICD-10-CM | POA: Diagnosis not present

## 2019-02-12 ENCOUNTER — Other Ambulatory Visit: Payer: Self-pay | Admitting: Family Medicine

## 2019-02-26 ENCOUNTER — Other Ambulatory Visit: Payer: Self-pay | Admitting: Family Medicine

## 2019-03-04 ENCOUNTER — Other Ambulatory Visit: Payer: Self-pay | Admitting: Family Medicine

## 2019-03-16 ENCOUNTER — Other Ambulatory Visit: Payer: Self-pay | Admitting: Family Medicine

## 2019-03-17 ENCOUNTER — Other Ambulatory Visit: Payer: Self-pay | Admitting: Family Medicine

## 2019-03-17 MED ORDER — SPIRIVA HANDIHALER 18 MCG IN CAPS: ORAL_CAPSULE | RESPIRATORY_TRACT | 0 refills | Status: DC

## 2019-03-17 NOTE — Addendum Note (Signed)
Addended by: Marian Sorrow on: 03/17/2019 01:57 PM   Modules accepted: Orders

## 2019-04-02 ENCOUNTER — Other Ambulatory Visit: Payer: Self-pay

## 2019-04-02 ENCOUNTER — Ambulatory Visit
Admission: RE | Admit: 2019-04-02 | Discharge: 2019-04-02 | Disposition: A | Discharge status: Home / Self Care | Payer: PPO | Source: Ambulatory Visit | Attending: Family Medicine | Admitting: Family Medicine

## 2019-04-02 DIAGNOSIS — Z1239 Encounter for other screening for malignant neoplasm of breast: Secondary | ICD-10-CM

## 2019-04-02 DIAGNOSIS — Z1231 Encounter for screening mammogram for malignant neoplasm of breast: Secondary | ICD-10-CM | POA: Diagnosis not present

## 2019-04-08 ENCOUNTER — Telehealth: Payer: Self-pay

## 2019-04-08 NOTE — Telephone Encounter (Signed)
   Left voicemail on home phone to call back to discuss preop clearance. Voicemail box was full on cell phone.   Abigail Butts, PA-C 04/08/19; 9:00 AM

## 2019-04-08 NOTE — Telephone Encounter (Signed)
    [facility] Pre-operative Risk Assessment    Request for surgical clearance:  1. What type of surgery is being performed? Left parotidectomy   2. When is this surgery scheduled? 04/28/19   3. What type of clearance is required (medical clearance vs. Pharmacy clearance to hold med vs. Both)? Medical  4. Are there any medications that need to be held prior to surgery and how long? No   5. Practice name and name of physician performing surgery?  [facility], Dr. Nils Pyle Tech   6. What is your office phone number [phone]    7.   What is your office fax number [phone]  8.   Anesthesia type (None, local, MAC, general) ? None listed   Katherine Foley 04/08/2019, 8:26 AM  _________________________________________________________________   (provider comments below)

## 2019-04-08 NOTE — Telephone Encounter (Signed)
   Primary Cardiologist: Sherren Mocha, MD  Chart reviewed as part of pre-operative protocol coverage. Patient was contacted 04/08/2019 in reference to pre-operative risk assessment for pending surgery as outlined below.  Katherine Foley was last seen on 12/09/2018 by Richardson Dopp, PA-C via a telemedicine visit.  Since that day, Katherine Foley has done well from a cardiac standpoint. She denies any chest pain, SOB, DOE, or palpitations. She walks her dog ~1 mile per day and can go up a flight of stairs without anginal complaints.   Therefore, based on ACC/AHA guidelines, the patient would be at acceptable risk for the planned procedure without further cardiovascular testing.   I will route this recommendation to the requesting party via Epic fax function and remove from pre-op pool.  Please call with questions.  Abigail Butts, PA-C 04/08/2019, 1:34 PM

## 2019-04-13 ENCOUNTER — Other Ambulatory Visit: Payer: Self-pay | Admitting: Family Medicine

## 2019-04-14 ENCOUNTER — Other Ambulatory Visit: Payer: Self-pay | Admitting: Otolaryngology

## 2019-04-15 ENCOUNTER — Ambulatory Visit: Payer: PPO

## 2019-04-16 ENCOUNTER — Ambulatory Visit (INDEPENDENT_AMBULATORY_CARE_PROVIDER_SITE_OTHER): Payer: PPO

## 2019-04-16 ENCOUNTER — Other Ambulatory Visit: Payer: Self-pay

## 2019-04-16 VITALS — BP 140/68 | Temp 97.9°F | Ht 63.0 in | Wt 162.0 lb

## 2019-04-16 DIAGNOSIS — Z0000 Encounter for general adult medical examination without abnormal findings: Secondary | ICD-10-CM

## 2019-04-16 NOTE — Progress Notes (Signed)
Subjective:   Katherine Foley is a 68 y.o. female who presents for Medicare Annual (Subsequent) preventive examination.  Review of Systems:  Cardiac Risk Factors include: advanced age (>39mn, >>50women);hypertension;obesity (BMI >30kg/m2)     Objective:     Vitals: BP 140/68   Temp 97.9 F (36.6 C)   Ht 5' 3"  (1.6 m)   Wt 162 lb (73.5 kg)   BMI 28.70 kg/m   Body mass index is 28.7 kg/m.  Advanced Directives 05/26/2018 04/04/2018 10/14/2017 03/29/2017 07/06/2016 05/31/2015 05/31/2015  Does Patient Have a Medical Advance Directive? No No No No No No No  Would patient like information on creating a medical advance directive? No - Patient declined (No Data) No - Patient declined Yes (MAU/Ambulatory/Procedural Areas - Information given) Yes - Educational materials given - -    Tobacco Social History   Tobacco Use  Smoking Status Former Smoker  . Packs/day: 1.50  . Years: 44.00  . Pack years: 66.00  . Types: Cigarettes  . Quit date: 09/03/2005  . Years since quitting: 13.6  Smokeless Tobacco Never Used      Clinical Intake:  Pre-visit preparation completed: Yes  Pain : No/denies pain   Nutritional Status: BMI 25 -29 Overweight   Information entered by :: CDenman GeorgeLPN  Past Medical History:  Diagnosis Date  . Adenomatous colon polyp   . ANXIETY DEPRESSION 02/15/2009  . AORTIC STENOSIS 07/07/2010   a. echo 12/23/10: EF 60-65%, mod to severe AS with mean gradient 29 mmHg;  b. 04/2011 s/p bioprosthetic AVR. // Echo 9/19:  Mild conc LVH, EF 60-65, no RWMA, Gr 1 DD, AVR ok (mean 10, peak 21), trivial MR, normal RVSF, mild TR   . Barrett's esophagus 04/14/2007  . Barrett's esophagus 2000  . CAD, NATIVE VESSEL 05/17/2010   a. NSTEMI 8/11 - BMS to RCA;  b. cath 4/12: dLAD 50%, RI 80% (PCI), AVCFX 30%, pRCA 30%, stent ok, 40-50, then 50% dist RCA;  c. s/p Promus DES to RI 12/25/10;  d. 04/2011 CABG x 1 (LIMA->LAD) @ time of AVR  . Carotid stenosis 05/17/2010   a. dopplers  170/26 RICA 437-85% LICA 08-85%(follow up due 11/12)  . Chronic bronchitis ([facility]    "multiple times" (03/25/2013)  . 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 Exertional shortness of breath    "related to COPD problems" (03/25/2013)  . Family history of breast cancer    2 daughters; PALB2 mutation in family  . Genetic testing 02/10/2018   PALB2 analysis at [facility] - Negative for familial mutation  . GERD (gastroesophageal reflux disease)   . H/O hiatal hernia   . Hemorrhoid thrombosis    "had it lanced" (03/25/2013)  . Herpes simplex without mention of complication 50/27/7412 . History of blood transfusion    'w/[facility]HS" (03/25/2013)  . [place] 08/[place]  . HYPERTENSION 04/14/2007  . MIGRAINE HEADACHE 04/20/2008  . Myocardial infarction ([facility] 2011   "during catherization" (03/25/2013)  . OSTEOPENIA 04/14/2007  . Ovarian cyst   . Pneumonia    "multiple times" (03/25/2013)  . URINARY INCONTINENCE, STRESS, MILD 08/26/2007  . Urine, incontinence, stress female    Past Surgical History:  Procedure Laterality Date  . AORTIC VALVE REPLACEMENT  2012  . APPENDECTOMY    . BLEPHAROPLASTY Bilateral ~ 1998  . CARDIAC CATHETERIZATION N/A 07/06/2016   Procedure: Left Heart Cath and Cors/Grafts Angiography;  Surgeon: MSherren Mocha MD;  [facility];  Service:  Cardiovascular;  Laterality: N/A;  . CHOLECYSTECTOMY N/A 03/25/2013   Procedure: LAPAROSCOPIC CHOLECYSTECTOMY WITH INTRAOPERATIVE CHOLANGIOGRAM;  Surgeon: Imogene Burn. Georgette Dover, MD;  Location: [facility];  Service: General;  Laterality: N/A;  . CORONARY ANGIOGRAPHY N/A 05/26/2018   Procedure: CORONARY ANGIOGRAPHY (CATH LAB);  Surgeon: Jettie Booze, MD;  Location: [facility];  Service: Cardiovascular;  Laterality: N/A;  . CORONARY ANGIOPLASTY WITH STENT PLACEMENT  2010   "I have 2 stents" (03/25/2013)  . NASAL SINUS SURGERY  1997; 1998   "cust in maxillary sinus; put a stent in then removed it"  (03/25/2013)  . OVARIAN CYST REMOVAL Right 2011   "size of a soccer ball" (03/25/2013)  . thrombosed hemorrohid lanced    . TONSILLECTOMY AND ADENOIDECTOMY    . TOTAL ABDOMINAL HYSTERECTOMY     Family History  Problem Relation Age of Onset  . Coronary artery disease Other   . Heart disease Other        6 stents  . Heart disease Mother   . COPD Father        smoked  . Heart disease Father   . Rheum arthritis Father   . Heart disease Maternal Uncle   . Breast cancer Daughter        currently 14  . Asthma Son        as a child   . Breast cancer Daughter 32       currently 73; PALB2 mutation  . Cancer Other        very distant paternal cousin; pancreatic ca  . Cirrhosis Sister   . Stroke Brother   . Colon cancer Neg Hx   . Colon polyps Neg Hx    Social History   Socioeconomic History  . Marital status: Single    Spouse name: Not on file  . Number of children: 3  . Years of education: Not on file  . Highest education level: Not on file  Occupational History  . Occupation: Glass blower/designer: [employer]  . Financial resource strain: Not on file  . Food insecurity    Worry: Not on file    Inability: Not on file  . Transportation needs    Medical: Not on file    Non-medical: Not on file  Tobacco Use  . Smoking status: Former Smoker    Packs/day: 1.50    Years: 44.00    Pack years: 66.00    Types: Cigarettes    Quit date: 09/03/2005    Years since quitting: 13.6  . Smokeless tobacco: Never Used  Substance and Sexual Activity  . Alcohol use: Not Currently    Alcohol/week: 0.0 standard drinks  . Drug use: No  . Sexual activity: Not Currently  Lifestyle  . Physical activity    Days per week: Not on file    Minutes per session: Not on file  . Stress: Not on file  Relationships  . Social Herbalist on phone: Not on file    Gets together: Not on file    Attends religious service: Not on file    Active member of club or organization: Not on  file    Attends meetings of clubs or organizations: Not on file    Relationship status: Not on file  Other Topics Concern  . Not on file  Social History Narrative   Prolonged engagement 17 years in 2017. 3 children. 6 grandkids. 2 greatgrandkids. Keeps greatgrandson on tuesdays- loves  her so much.       Retired from [employer] (parttime- Dec 12)   Faith is very important to her      Hobbies: reading, cross stich, sew. Wants to learn to knit.        Outpatient Encounter Medications as of 04/16/2019  Medication Sig  . Albuterol Sulfate (PROAIR RESPICLICK) 998 (90 Base) MCG/ACT AEPB Inhale 1 puff into the lungs every 6 (six) hours as needed (wheezing or shortness of breath).  Marland Kitchen aspirin 81 MG tablet Take 81 mg by mouth daily.    . cetirizine (ZYRTEC) 10 MG tablet Take 10 mg by mouth daily as needed for allergies.   Marland Kitchen desvenlafaxine (PRISTIQ) 100 MG 24 hr tablet Take 1 tablet (100 mg total) by mouth daily.  Marland Kitchen DEXILANT 60 MG capsule Take 1 capsule by mouth once daily  . fluticasone (FLONASE) 50 MCG/ACT nasal spray Use 2 spray(s) in each nostril once daily  . gabapentin (NEURONTIN) 300 MG capsule Take 300 mg by mouth daily.   . hydrochlorothiazide (HYDRODIURIL) 12.5 MG tablet Take 1 tablet by mouth once daily  . LORazepam (ATIVAN) 0.5 MG tablet TAKE 1 TABLET BY MOUTH TWICE DAILY AS NEEDED FOR ANXIETY. DON'T DRIVE 8 HOURS AFTER USING  . Multiple Vitamins-Minerals (MULTIVITAMIN ADULT PO) Take 1 tablet by mouth daily.   . nitroGLYCERIN (NITROSTAT) 0.4 MG SL tablet Place 0.4 mg under the tongue every 5 (five) minutes as needed for chest pain.  Marland Kitchen propranolol (INDERAL) 20 MG tablet Take 1 tablet by mouth twice daily  . rosuvastatin (CRESTOR) 20 MG tablet TAKE 1 TABLET BY MOUTH ONCE DAILY  . sertraline (ZOLOFT) 50 MG tablet Take 1 tablet by mouth once daily  . SYMBICORT 160-4.5 MCG/ACT inhaler Inhale 2 puffs by mouth twice daily  . tiotropium (SPIRIVA HANDIHALER) 18 MCG inhalation capsule Inhale 1 puff  by mouth once daily  . [DISCONTINUED] Aclidinium Bromide (TUDORZA PRESSAIR) 400 MCG/ACT AEPB Inhale 1 puff into the lungs 2 (two) times daily.   No facility-administered encounter medications on file as of 04/16/2019.     Activities of Daily Living In your present state of health, do you have any difficulty performing the following activities: 04/16/2019  Hearing? N  Vision? N  Difficulty concentrating or making decisions? N  Walking or climbing stairs? N  Dressing or bathing? N  Doing errands, shopping? N  Preparing Food and eating ? N  Using the Toilet? N  In the past six months, have you accidently leaked urine? N  Do you have problems with loss of bowel control? N  Managing your Medications? N  Managing your Finances? N  Housekeeping or managing your Housekeeping? N  Some recent data might be hidden    Patient Care Team: Marin Olp, MD as PCP - General (Family Medicine) Sherren Mocha, MD as PCP - Cardiology (Cardiology) Bensimhon, Shaune Pascal, MD (Cardiology) Tanda Rockers, MD as Consulting Physician (Pulmonary Disease) Jamesetta Geralds, [place] (Chiropractic Medicine) Leta Baptist, MD as Consulting Physician (Otolaryngology)    Assessment:   This is a routine wellness examination for [place].  Exercise Activities and Dietary recommendations Current Exercise Habits: The patient does not participate in regular exercise at present  Goals    . Patient Stated (pt-stated)     Find something to focus on during grieving period that provided joy     . Weight (lb) < 145 lb (65.8 kg)     Lose weight by watching caloric intake.  Fall Risk Fall Risk  04/16/2019 04/04/2018 03/29/2017 03/22/2016 06/24/2015  Falls in the past year? 1 Yes Yes No No  Number falls in past yr: 1 1 2  or more - -  Injury with Fall? 0 No No - -  Follow up Education provided - Falls prevention discussed - -   Timed Get Up and Go performed: completed and within normal limits   Depression Screen PHQ 2/9  Scores 10/30/2018 04/04/2018 03/10/2018 01/03/2018  PHQ - 2 Score 3 4 2 4   PHQ- 9 Score 18 21 15 19      Cognitive Function     6CIT Screen 04/04/2018  What Year? 0 points  What month? 0 points  What time? 0 points  Count back from 20 0 points  Months in reverse 0 points    Immunization History  Administered Date(s) Administered  . Influenza Split 06/19/2011, 07/02/2012  . Influenza Whole 08/03/2009  . Influenza, High Dose Seasonal PF 05/20/2015  . Influenza,inj,Quad PF,6+ Mos 05/21/2013, 05/14/2014, 06/17/2018  . Influenza-Unspecified 08/17/2016, 06/19/2017, 06/17/2018  . Pneumococcal Conjugate-13 06/14/2014  . Pneumococcal Polysaccharide-23 03/22/2016  . Td 08/29/2009  . Zoster 01/21/2016  . Zoster Recombinat (Shingrix) 03/10/2018, 07/15/2018    Qualifies for Shingles Vaccine? Discussed and patient will check with pharmacy for coverage.  Patient education handout provided    Screening Tests Health Maintenance  Topic Date Due  . INFLUENZA VACCINE  04/04/2019  . TETANUS/TDAP  08/30/2019  . MAMMOGRAM  04/01/2021  . COLONOSCOPY  07/14/2024  . DEXA SCAN  Completed  . Hepatitis C Screening  Completed  . PNA vac Low Risk Adult  Completed    Cancer Screenings: Breast:  Up to date on Mammogram? Yes; last 04/02/19 Up to date of Bone Density/Dexa? Patient will call if she would like scheduled  Colorectal: colonoscopy 07/14/14      Plan:   I have personally reviewed and addressed the Medicare Annual Wellness questionnaire and have noted the following in the patient's chart:  A. Medical and social history B. Use of alcohol, tobacco or illicit drugs  C. Current medications and supplements D. Functional ability and status E.  Nutritional status F.  Physical activity G. Advance directives H. List of other physicians I.  Hospitalizations, surgeries, and ER visits in previous 12 months J.  [place] such as hearing and vision if needed, cognitive and depression L.  Referrals, records requested, and appointments- community resource referral for grief counseling   In addition, I have reviewed and discussed with patient certain preventive protocols, quality metrics, and best practice recommendations. A written personalized care plan for preventive services as well as general preventive health recommendations were provided to patient.   Signed,  Denman George, LPN  Nurse Health Advisor   Nurse Notes:  Patient is requesting a refill of Ativan due to daughter's birthday coming up and she is still have quite a hard time with grieving.  Would also like to know if it would you would agree to increasing Zoloft dose.   I am going to look into counseling options for patient as she would prefer to have in person counseling.

## 2019-04-16 NOTE — Patient Instructions (Signed)
[name] , Thank you for taking time to come for your Medicare Wellness Visit. I appreciate your ongoing commitment to your health goals. Please review the following plan we discussed and let me know if I can assist you in the future.   Screening recommendations/referrals: Colorectal Screening: completed 07/14/14 Mammogram: completed 04/02/19 Bone Density: due; let us know if you would like this scheduled   Vision and Dental Exams: Recommended annual ophthalmology exams for early detection of glaucoma and other disorders of the eye Recommended annual dental exams for proper oral hygiene  Vaccinations: Influenza vaccine:  recommended this fall either at PCP office or through your local pharmacy  Pneumococcal vaccine: completed  Tdap vaccine: completed Shingles vaccine: completed   Advanced directives: Advance directives discussed with you today. I have provided a copy for you to complete at home and have notarized. Once this is complete please bring a copy in to our office so we can scan it into your chart.  Goals: Recommend to decrease portion sizes by eating 3 small healthy meals and at least 2 healthy snacks per day.  Next appointment: Please schedule your Annual Wellness Visit with your Nurse Health Advisor in one year.  Preventive Care 24 Years and Older, Female Preventive care refers to lifestyle choices and visits with your health care provider that can promote health and wellness. What does preventive care include?  A yearly physical exam. This is also called an annual well check.  Dental exams once or twice a year.  Routine eye exams. Ask your health care provider how often you should have your eyes checked.  Personal lifestyle choices, including:  Daily care of your teeth and gums.  Regular physical activity.  Eating a healthy diet.  Avoiding tobacco and drug use.  Limiting alcohol use.  Practicing safe sex.  Taking low-dose aspirin every day if recommended by  your health care provider.  Taking vitamin and mineral supplements as recommended by your health care provider. What happens during an annual well check? The services and screenings done by your health care provider during your annual well check will depend on your age, overall health, lifestyle risk factors, and family history of disease. Counseling  Your health care provider may ask you questions about your:  Alcohol use.  Tobacco use.  Drug use.  Emotional well-being.  Home and relationship well-being.  Sexual activity.  Eating habits.  History of falls.  Memory and ability to understand (cognition).  Work and work Statistician.  Reproductive health. Screening  You may have the following tests or measurements:  Height, weight, and BMI.  Blood pressure.  Lipid and cholesterol levels. These may be checked every 5 years, or more frequently if you are over 55 years old.  Skin check.  Lung cancer screening. You may have this screening every year starting at age 90 if you have a 30-pack-year history of smoking and currently smoke or have quit within the past 15 years.  Fecal occult blood test (FOBT) of the stool. You may have this test every year starting at age 42.  Flexible sigmoidoscopy or colonoscopy. You may have a sigmoidoscopy every 5 years or a colonoscopy every 10 years starting at age 6.  Hepatitis C blood test.  Hepatitis B blood test.  Sexually transmitted disease (STD) testing.  Diabetes screening. This is done by checking your blood sugar (glucose) after you have not eaten for a while (fasting). You may have this done every 1-3 years.  Bone density scan. This is  done to screen for osteoporosis. You may have this done starting at age 58.  Mammogram. This may be done every 1-2 years. Talk to your health care provider about how often you should have regular mammograms. Talk with your health care provider about your test results, treatment options, and  if necessary, the need for more tests. Vaccines  Your health care provider may recommend certain vaccines, such as:  Influenza vaccine. This is recommended every year.  Tetanus, diphtheria, and acellular pertussis (Tdap, Td) vaccine. You may need a Td booster every 10 years.  Zoster vaccine. You may need this after age 71.  Pneumococcal 13-valent conjugate (PCV13) vaccine. One dose is recommended after age 16.  Pneumococcal polysaccharide (PPSV23) vaccine. One dose is recommended after age 68. Talk to your health care provider about which screenings and vaccines you need and how often you need them. This information is not intended to replace advice given to you by your health care provider. Make sure you discuss any questions you have with your health care provider. Document Released: 09/16/2015 Document Revised: 05/09/2016 Document Reviewed: 06/21/2015 Elsevier Interactive Patient Education  2017 [place] Prevention in the Home Falls can cause injuries. They can happen to people of all ages. There are many things you can do to make your home safe and to help prevent falls. What can I do on the outside of my home?  Regularly fix the edges of walkways and driveways and fix any cracks.  Remove anything that might make you trip as you walk through a door, such as a raised step or threshold.  Trim any bushes or trees on the path to your home.  Use bright outdoor lighting.  Clear any walking paths of anything that might make someone trip, such as rocks or tools.  Regularly check to see if handrails are loose or broken. Make sure that both sides of any steps have handrails.  Any raised decks and porches should have guardrails on the edges.  Have any leaves, snow, or ice cleared regularly.  Use sand or salt on walking paths during winter.  Clean up any spills in your garage right away. This includes oil or grease spills. What can I do in the bathroom?  Use night  lights.  Install grab bars by the toilet and in the tub and shower. Do not use towel bars as grab bars.  Use non-skid mats or decals in the tub or shower.  If you need to sit down in the shower, use a plastic, non-slip stool.  Keep the floor dry. Clean up any water that spills on the floor as soon as it happens.  Remove soap buildup in the tub or shower regularly.  Attach bath mats securely with double-sided non-slip rug tape.  Do not have throw rugs and other things on the floor that can make you trip. What can I do in the bedroom?  Use night lights.  Make sure that you have a light by your bed that is easy to reach.  Do not use any sheets or blankets that are too big for your bed. They should not hang down onto the floor.  Have a firm chair that has side arms. You can use this for support while you get dressed.  Do not have throw rugs and other things on the floor that can make you trip. What can I do in the kitchen?  Clean up any spills right away.  Avoid walking on wet floors.  Keep  items that you use a lot in easy-to-reach places.  If you need to reach something above you, use a strong step stool that has a grab bar.  Keep electrical cords out of the way.  Do not use floor polish or wax that makes floors slippery. If you must use wax, use non-skid floor wax.  Do not have throw rugs and other things on the floor that can make you trip. What can I do with my stairs?  Do not leave any items on the stairs.  Make sure that there are handrails on both sides of the stairs and use them. Fix handrails that are broken or loose. Make sure that handrails are as long as the stairways.  Check any carpeting to make sure that it is firmly attached to the stairs. Fix any carpet that is loose or worn.  Avoid having throw rugs at the top or bottom of the stairs. If you do have throw rugs, attach them to the floor with carpet tape.  Make sure that you have a light switch at the  top of the stairs and the bottom of the stairs. If you do not have them, ask someone to add them for you. What else can I do to help prevent falls?  Wear shoes that:  Do not have high heels.  Have rubber bottoms.  Are comfortable and fit you well.  Are closed at the toe. Do not wear sandals.  If you use a stepladder:  Make sure that it is fully opened. Do not climb a closed stepladder.  Make sure that both sides of the stepladder are locked into place.  Ask someone to hold it for you, if possible.  Clearly mark and make sure that you can see:  Any grab bars or handrails.  First and last steps.  Where the edge of each step is.  Use tools that help you move around (mobility aids) if they are needed. These include:  Canes.  Walkers.  Scooters.  Crutches.  Turn on the lights when you go into a dark area. Replace any light bulbs as soon as they burn out.  Set up your furniture so you have a clear path. Avoid moving your furniture around.  If any of your floors are uneven, fix them.  If there are any pets around you, be aware of where they are.  Review your medicines with your doctor. Some medicines can make you feel dizzy. This can increase your chance of falling. Ask your doctor what other things that you can do to help prevent falls. This information is not intended to replace advice given to you by your health care provider. Make sure you discuss any questions you have with your health care provider. Document Released: 06/16/2009 Document Revised: 01/26/2016 Document Reviewed: 09/24/2014 Elsevier Interactive Patient Education  2017 [employer].

## 2019-04-22 ENCOUNTER — Encounter (HOSPITAL_BASED_OUTPATIENT_CLINIC_OR_DEPARTMENT_OTHER): Payer: Self-pay | Admitting: *Deleted

## 2019-04-22 ENCOUNTER — Other Ambulatory Visit: Payer: Self-pay

## 2019-04-24 ENCOUNTER — Other Ambulatory Visit: Payer: Self-pay

## 2019-04-24 ENCOUNTER — Encounter (HOSPITAL_BASED_OUTPATIENT_CLINIC_OR_DEPARTMENT_OTHER)
Admission: RE | Admit: 2019-04-24 | Discharge: 2019-04-24 | Disposition: A | Discharge status: Home / Self Care | Payer: PPO | Source: Ambulatory Visit | Attending: Otolaryngology | Admitting: Otolaryngology

## 2019-04-24 ENCOUNTER — Other Ambulatory Visit (HOSPITAL_COMMUNITY)
Admission: RE | Admit: 2019-04-24 | Discharge: 2019-04-24 | Disposition: A | Discharge status: Home / Self Care | Payer: PPO | Source: Ambulatory Visit | Attending: Otolaryngology | Admitting: Otolaryngology

## 2019-04-24 DIAGNOSIS — Z7951 Long term (current) use of inhaled steroids: Secondary | ICD-10-CM | POA: Diagnosis not present

## 2019-04-24 DIAGNOSIS — J449 Chronic obstructive pulmonary disease, unspecified: Secondary | ICD-10-CM | POA: Diagnosis not present

## 2019-04-24 DIAGNOSIS — I10 Essential (primary) hypertension: Secondary | ICD-10-CM | POA: Diagnosis not present

## 2019-04-24 DIAGNOSIS — Z01812 Encounter for preprocedural laboratory examination: Secondary | ICD-10-CM | POA: Diagnosis not present

## 2019-04-24 DIAGNOSIS — Z20828 Contact with and (suspected) exposure to other viral communicable diseases: Secondary | ICD-10-CM | POA: Diagnosis not present

## 2019-04-24 DIAGNOSIS — Z87891 Personal history of nicotine dependence: Secondary | ICD-10-CM | POA: Diagnosis not present

## 2019-04-24 DIAGNOSIS — D110 Benign neoplasm of parotid gland: Secondary | ICD-10-CM | POA: Diagnosis not present

## 2019-04-24 DIAGNOSIS — Z79899 Other long term (current) drug therapy: Secondary | ICD-10-CM | POA: Diagnosis not present

## 2019-04-24 DIAGNOSIS — K219 Gastro-esophageal reflux disease without esophagitis: Secondary | ICD-10-CM | POA: Diagnosis not present

## 2019-04-24 DIAGNOSIS — F419 Anxiety disorder, unspecified: Secondary | ICD-10-CM | POA: Diagnosis not present

## 2019-04-24 DIAGNOSIS — K449 Diaphragmatic hernia without obstruction or gangrene: Secondary | ICD-10-CM | POA: Diagnosis not present

## 2019-04-24 DIAGNOSIS — F329 Major depressive disorder, single episode, unspecified: Secondary | ICD-10-CM | POA: Diagnosis not present

## 2019-04-24 LAB — BASIC METABOLIC PANEL
Anion gap: 11 (ref 5–15)
BUN: 17 mg/dL (ref 8–23)
CO2: 26 mmol/L (ref 22–32)
Calcium: 9.4 mg/dL (ref 8.9–10.3)
Chloride: 94 mmol/L — ABNORMAL LOW (ref 98–111)
Creatinine, Ser: 0.58 mg/dL (ref 0.44–1.00)
GFR calc Af Amer: >60 mL/min (ref >60)
GFR calc non Af Amer: >60 mL/min (ref >60)
Glucose, Bld: 116 mg/dL — ABNORMAL HIGH (ref 70–99)
Potassium: 4.1 mmol/L (ref 3.5–5.1)
Sodium: 131 mmol/L — ABNORMAL LOW (ref 135–145)

## 2019-04-24 LAB — SARS CORONAVIRUS 2 (TAT 6-24 HRS): SARS Coronavirus 2: NEGATIVE

## 2019-04-25 MED ORDER — SERTRALINE HCL 100 MG PO TABS: 100.0000 mg | ORAL_TABLET | Freq: Every day | ORAL | 5 refills | Status: DC

## 2019-04-25 MED ORDER — LORAZEPAM 0.5 MG PO TABS: ORAL_TABLET | ORAL | 0 refills | Status: DC

## 2019-04-25 NOTE — Progress Notes (Signed)
I have reviewed and agree with note, evaluation, plan.   Courtney- I increased zoloft to 100mg . I refilled ativan. Please confirm with her no thoughts of self harm and advise 6 week follow up (need to recheck her BP and check in on how shes doing with zoloft increase).  Garret Reddish, MD

## 2019-04-25 NOTE — Addendum Note (Signed)
Addended by: Marin Olp on: 04/25/2019 05:02 PM   Modules accepted: Orders

## 2019-04-28 ENCOUNTER — Encounter (HOSPITAL_BASED_OUTPATIENT_CLINIC_OR_DEPARTMENT_OTHER)
Admission: RE | Disposition: A | Discharge status: Home / Self Care | Payer: Self-pay | Source: Home / Self Care | Attending: Otolaryngology

## 2019-04-28 ENCOUNTER — Ambulatory Visit (HOSPITAL_BASED_OUTPATIENT_CLINIC_OR_DEPARTMENT_OTHER): Admit: 2019-04-28 | Payer: PPO | Admitting: Otolaryngology

## 2019-04-28 ENCOUNTER — Ambulatory Visit (HOSPITAL_BASED_OUTPATIENT_CLINIC_OR_DEPARTMENT_OTHER)
Admission: RE | Admit: 2019-04-28 | Discharge: 2019-04-29 | Disposition: A | Discharge status: Home / Self Care | Payer: PPO | Attending: Otolaryngology | Admitting: Otolaryngology

## 2019-04-28 ENCOUNTER — Ambulatory Visit (HOSPITAL_BASED_OUTPATIENT_CLINIC_OR_DEPARTMENT_OTHER): Payer: PPO | Admitting: Certified Registered"

## 2019-04-28 ENCOUNTER — Other Ambulatory Visit: Payer: Self-pay

## 2019-04-28 ENCOUNTER — Encounter (HOSPITAL_BASED_OUTPATIENT_CLINIC_OR_DEPARTMENT_OTHER): Payer: Self-pay

## 2019-04-28 DIAGNOSIS — Z87891 Personal history of nicotine dependence: Secondary | ICD-10-CM | POA: Insufficient documentation

## 2019-04-28 DIAGNOSIS — Z79899 Other long term (current) drug therapy: Secondary | ICD-10-CM | POA: Diagnosis not present

## 2019-04-28 DIAGNOSIS — J449 Chronic obstructive pulmonary disease, unspecified: Secondary | ICD-10-CM | POA: Insufficient documentation

## 2019-04-28 DIAGNOSIS — Z9049 Acquired absence of other specified parts of digestive tract: Secondary | ICD-10-CM

## 2019-04-28 DIAGNOSIS — F329 Major depressive disorder, single episode, unspecified: Secondary | ICD-10-CM | POA: Diagnosis not present

## 2019-04-28 DIAGNOSIS — K449 Diaphragmatic hernia without obstruction or gangrene: Secondary | ICD-10-CM | POA: Diagnosis not present

## 2019-04-28 DIAGNOSIS — I10 Essential (primary) hypertension: Secondary | ICD-10-CM | POA: Diagnosis not present

## 2019-04-28 DIAGNOSIS — D110 Benign neoplasm of parotid gland: Secondary | ICD-10-CM | POA: Insufficient documentation

## 2019-04-28 DIAGNOSIS — I2510 Atherosclerotic heart disease of native coronary artery without angina pectoris: Secondary | ICD-10-CM | POA: Diagnosis not present

## 2019-04-28 DIAGNOSIS — F419 Anxiety disorder, unspecified: Secondary | ICD-10-CM | POA: Diagnosis not present

## 2019-04-28 DIAGNOSIS — K219 Gastro-esophageal reflux disease without esophagitis: Secondary | ICD-10-CM | POA: Insufficient documentation

## 2019-04-28 DIAGNOSIS — K118 Other diseases of salivary glands: Secondary | ICD-10-CM | POA: Diagnosis not present

## 2019-04-28 DIAGNOSIS — Z7951 Long term (current) use of inhaled steroids: Secondary | ICD-10-CM | POA: Diagnosis not present

## 2019-04-28 DIAGNOSIS — D37030 Neoplasm of uncertain behavior of the parotid salivary glands: Secondary | ICD-10-CM | POA: Diagnosis not present

## 2019-04-28 HISTORY — DX: Anxiety disorder, unspecified: F41.9

## 2019-04-28 HISTORY — DX: Other diseases of salivary glands: K11.8

## 2019-04-28 HISTORY — PX: PAROTIDECTOMY: SHX2163

## 2019-04-28 SURGERY — CLOSED REDUCTION, FRACTURE, NASAL BONE
Anesthesia: General | Laterality: Left

## 2019-04-28 SURGERY — EXCISION, PAROTID GLAND
Anesthesia: General | Site: Face | Laterality: Left

## 2019-04-28 MED ORDER — DEXAMETHASONE SODIUM PHOSPHATE 4 MG/ML IJ SOLN
INTRAMUSCULAR | Status: DC | PRN
  Administered 2019-04-28: 10 mg via INTRAVENOUS

## 2019-04-28 MED ORDER — GLYCOPYRROLATE 0.2 MG/ML IJ SOLN
INTRAMUSCULAR | Status: DC | PRN
  Administered 2019-04-28 (×2): .1 mg via INTRAVENOUS

## 2019-04-28 MED ORDER — NITROGLYCERIN 0.4 MG SL SUBL: 0.4000 mg | SUBLINGUAL_TABLET | SUBLINGUAL | Status: DC | PRN

## 2019-04-28 MED ORDER — ROCURONIUM BROMIDE 100 MG/10ML IV SOLN
INTRAVENOUS | Status: DC | PRN
  Administered 2019-04-28: 50 mg via INTRAVENOUS

## 2019-04-28 MED ORDER — PROPOFOL 10 MG/ML IV BOLUS
INTRAVENOUS | Status: AC
  Filled 2019-04-28: qty 20

## 2019-04-28 MED ORDER — MIDAZOLAM HCL 2 MG/2ML IJ SOLN
1.0000 mg | INTRAMUSCULAR | Status: DC | PRN
  Administered 2019-04-28: 1 mg via INTRAVENOUS

## 2019-04-28 MED ORDER — SCOPOLAMINE 1 MG/3DAYS TD PT72: 1.0000 | MEDICATED_PATCH | Freq: Once | TRANSDERMAL | Status: DC

## 2019-04-28 MED ORDER — ALBUTEROL SULFATE 108 (90 BASE) MCG/ACT IN AEPB: 1.0000 | INHALATION_SPRAY | Freq: Four times a day (QID) | RESPIRATORY_TRACT | Status: DC | PRN

## 2019-04-28 MED ORDER — KETOROLAC TROMETHAMINE 0.5 % OP SOLN
1.0000 [drp] | Freq: Three times a day (TID) | OPHTHALMIC | Status: DC | PRN
  Administered 2019-04-28 – 2019-04-29 (×3): 1 [drp] via OPHTHALMIC

## 2019-04-28 MED ORDER — ROSUVASTATIN CALCIUM 20 MG PO TABS: 20.0000 mg | ORAL_TABLET | Freq: Every day | ORAL | Status: DC

## 2019-04-28 MED ORDER — ONDANSETRON HCL 4 MG/2ML IJ SOLN
INTRAMUSCULAR | Status: AC
  Filled 2019-04-28: qty 2

## 2019-04-28 MED ORDER — SUGAMMADEX SODIUM 200 MG/2ML IV SOLN
INTRAVENOUS | Status: DC | PRN
  Administered 2019-04-28: 200 mg via INTRAVENOUS

## 2019-04-28 MED ORDER — OXYCODONE-ACETAMINOPHEN 5-325 MG PO TABS: 1.0000 | ORAL_TABLET | ORAL | 0 refills | Status: AC | PRN

## 2019-04-28 MED ORDER — LIDOCAINE 2% (20 MG/ML) 5 ML SYRINGE
INTRAMUSCULAR | Status: AC
  Filled 2019-04-28: qty 5

## 2019-04-28 MED ORDER — MOMETASONE FURO-FORMOTEROL FUM 200-5 MCG/ACT IN AERO
2.0000 | INHALATION_SPRAY | Freq: Two times a day (BID) | RESPIRATORY_TRACT | Status: DC
  Administered 2019-04-28: 2 via RESPIRATORY_TRACT

## 2019-04-28 MED ORDER — BSS IO SOLN
15.0000 mL | Freq: Once | INTRAOCULAR | Status: AC
  Administered 2019-04-28: 15 mL

## 2019-04-28 MED ORDER — DEXAMETHASONE SODIUM PHOSPHATE 10 MG/ML IJ SOLN
INTRAMUSCULAR | Status: AC
  Filled 2019-04-28: qty 1

## 2019-04-28 MED ORDER — FENTANYL CITRATE (PF) 100 MCG/2ML IJ SOLN
INTRAMUSCULAR | Status: AC
  Filled 2019-04-28: qty 2

## 2019-04-28 MED ORDER — MORPHINE SULFATE (PF) 4 MG/ML IV SOLN
2.0000 mg | INTRAVENOUS | Status: DC | PRN
  Administered 2019-04-28: 4 mg via INTRAVENOUS
  Administered 2019-04-28: 2 mg via INTRAVENOUS
  Administered 2019-04-29: 4 mg via INTRAVENOUS
  Filled 2019-04-28 (×3): qty 1

## 2019-04-28 MED ORDER — OXYCODONE HCL 5 MG PO TABS: 5.0000 mg | ORAL_TABLET | Freq: Once | ORAL | Status: DC | PRN

## 2019-04-28 MED ORDER — MIDAZOLAM HCL 2 MG/2ML IJ SOLN
INTRAMUSCULAR | Status: AC
  Filled 2019-04-28: qty 2

## 2019-04-28 MED ORDER — ACETAMINOPHEN 160 MG/5ML PO SOLN: 650.0000 mg | ORAL | Status: DC | PRN

## 2019-04-28 MED ORDER — KCL IN DEXTROSE-NACL 20-5-0.45 MEQ/L-%-% IV SOLN
INTRAVENOUS | Status: DC
  Administered 2019-04-28: 13:00:00 via INTRAVENOUS
  Filled 2019-04-28: qty 1000

## 2019-04-28 MED ORDER — PROPOFOL 500 MG/50ML IV EMUL
INTRAVENOUS | Status: DC | PRN
  Administered 2019-04-28: 25 ug/kg/min via INTRAVENOUS

## 2019-04-28 MED ORDER — ONDANSETRON HCL 4 MG/2ML IJ SOLN: 4.0000 mg | Freq: Once | INTRAMUSCULAR | Status: DC | PRN

## 2019-04-28 MED ORDER — LIDOCAINE HCL (CARDIAC) PF 100 MG/5ML IV SOSY
PREFILLED_SYRINGE | INTRAVENOUS | Status: DC | PRN
  Administered 2019-04-28: 60 mg via INTRAVENOUS

## 2019-04-28 MED ORDER — ONDANSETRON HCL 4 MG/2ML IJ SOLN
INTRAMUSCULAR | Status: DC | PRN
  Administered 2019-04-28: 4 mg via INTRAVENOUS

## 2019-04-28 MED ORDER — PROPOFOL 500 MG/50ML IV EMUL
INTRAVENOUS | Status: AC
  Filled 2019-04-28: qty 50

## 2019-04-28 MED ORDER — CEFAZOLIN SODIUM 1 G IJ SOLR
INTRAMUSCULAR | Status: AC
  Filled 2019-04-28: qty 20

## 2019-04-28 MED ORDER — PHENYLEPHRINE HCL (PRESSORS) 10 MG/ML IV SOLN
INTRAVENOUS | Status: DC | PRN
  Administered 2019-04-28 (×6): 40 ug via INTRAVENOUS

## 2019-04-28 MED ORDER — EPHEDRINE 5 MG/ML INJ
INTRAVENOUS | Status: AC
  Filled 2019-04-28: qty 40

## 2019-04-28 MED ORDER — SUCCINYLCHOLINE CHLORIDE 200 MG/10ML IV SOSY
PREFILLED_SYRINGE | INTRAVENOUS | Status: AC
  Filled 2019-04-28: qty 10

## 2019-04-28 MED ORDER — AMOXICILLIN 875 MG PO TABS: 875.0000 mg | ORAL_TABLET | Freq: Two times a day (BID) | ORAL | 0 refills | Status: AC

## 2019-04-28 MED ORDER — TIOTROPIUM BROMIDE MONOHYDRATE 18 MCG IN CAPS: 18.0000 ug | ORAL_CAPSULE | Freq: Every day | RESPIRATORY_TRACT | Status: DC

## 2019-04-28 MED ORDER — TETRACAINE HCL 0.5 % OP SOLN
1.0000 [drp] | Freq: Once | OPHTHALMIC | Status: AC
  Administered 2019-04-28: 16:00:00 2 [drp] via OPHTHALMIC

## 2019-04-28 MED ORDER — PHENYLEPHRINE 40 MCG/ML (10ML) SYRINGE FOR IV PUSH (FOR BLOOD PRESSURE SUPPORT)
PREFILLED_SYRINGE | INTRAVENOUS | Status: AC
  Filled 2019-04-28: qty 20

## 2019-04-28 MED ORDER — POLYMYXIN B-TRIMETHOPRIM 10000-0.1 UNIT/ML-% OP SOLN
1.0000 [drp] | Freq: Three times a day (TID) | OPHTHALMIC | Status: AC
  Administered 2019-04-28 – 2019-04-29 (×3): 1 [drp] via OPHTHALMIC

## 2019-04-28 MED ORDER — GLYCOPYRROLATE PF 0.2 MG/ML IJ SOSY
PREFILLED_SYRINGE | INTRAMUSCULAR | Status: AC
  Filled 2019-04-28: qty 1

## 2019-04-28 MED ORDER — LIDOCAINE-EPINEPHRINE 1 %-1:100000 IJ SOLN
INTRAMUSCULAR | Status: DC | PRN
  Administered 2019-04-28: 5 mL

## 2019-04-28 MED ORDER — HYDROCHLOROTHIAZIDE 12.5 MG PO TABS: 12.5000 mg | ORAL_TABLET | Freq: Every day | ORAL | Status: DC

## 2019-04-28 MED ORDER — ROCURONIUM BROMIDE 10 MG/ML (PF) SYRINGE
PREFILLED_SYRINGE | INTRAVENOUS | Status: AC
  Filled 2019-04-28: qty 10

## 2019-04-28 MED ORDER — FENTANYL CITRATE (PF) 100 MCG/2ML IJ SOLN
50.0000 ug | INTRAMUSCULAR | Status: DC | PRN
  Administered 2019-04-28: 50 ug via INTRAVENOUS

## 2019-04-28 MED ORDER — OXYCODONE HCL 5 MG/5ML PO SOLN: 5.0000 mg | Freq: Once | ORAL | Status: DC | PRN

## 2019-04-28 MED ORDER — FENTANYL CITRATE (PF) 100 MCG/2ML IJ SOLN
25.0000 ug | INTRAMUSCULAR | Status: DC | PRN
  Administered 2019-04-28: 50 ug via INTRAVENOUS

## 2019-04-28 MED ORDER — CEFAZOLIN SODIUM-DEXTROSE 2-3 GM-%(50ML) IV SOLR
INTRAVENOUS | Status: DC | PRN
  Administered 2019-04-28: 2 g via INTRAVENOUS

## 2019-04-28 MED ORDER — PROPRANOLOL HCL 20 MG PO TABS
20.0000 mg | ORAL_TABLET | Freq: Two times a day (BID) | ORAL | Status: DC
  Administered 2019-04-28: 21:00:00 20 mg via ORAL

## 2019-04-28 MED ORDER — OXYCODONE-ACETAMINOPHEN 5-325 MG PO TABS
1.0000 | ORAL_TABLET | ORAL | Status: DC | PRN
  Administered 2019-04-28 (×2): 2 via ORAL
  Filled 2019-04-28 (×2): qty 2

## 2019-04-28 MED ORDER — SERTRALINE HCL 100 MG PO TABS: 100.0000 mg | ORAL_TABLET | Freq: Every day | ORAL | Status: DC

## 2019-04-28 MED ORDER — LACTATED RINGERS IV SOLN
INTRAVENOUS | Status: DC
  Administered 2019-04-28 (×2): via INTRAVENOUS

## 2019-04-28 MED ORDER — VENLAFAXINE HCL ER 150 MG PO CP24: 150.0000 mg | ORAL_CAPSULE | Freq: Every day | ORAL | Status: DC

## 2019-04-28 MED ORDER — KETOROLAC TROMETHAMINE 0.5 % OP SOLN
OPHTHALMIC | Status: AC
  Filled 2019-04-28: qty 5

## 2019-04-28 MED ORDER — ACETAMINOPHEN 650 MG RE SUPP: 650.0000 mg | RECTAL | Status: DC | PRN

## 2019-04-28 MED ORDER — POLYMYXIN B-TRIMETHOPRIM 10000-0.1 UNIT/ML-% OP SOLN
OPHTHALMIC | Status: AC
  Filled 2019-04-28: qty 10

## 2019-04-28 MED ORDER — EPHEDRINE SULFATE 50 MG/ML IJ SOLN
INTRAMUSCULAR | Status: DC | PRN
  Administered 2019-04-28 (×6): 10 mg via INTRAVENOUS
  Administered 2019-04-28: 5 mg via INTRAVENOUS

## 2019-04-28 MED ORDER — PROPOFOL 10 MG/ML IV BOLUS
INTRAVENOUS | Status: DC | PRN
  Administered 2019-04-28: 150 mg via INTRAVENOUS

## 2019-04-28 MED ORDER — BSS IO SOLN
INTRAOCULAR | Status: AC
  Filled 2019-04-28: qty 15

## 2019-04-28 SURGICAL SUPPLY — 61 items
ADH SKN CLS APL DERMABOND .7 (GAUZE/BANDAGES/DRESSINGS) ×1
APL SRG 3 HI ABS STRL LF PLS (MISCELLANEOUS) ×1
APPLICATOR DR MATTHEWS STRL (MISCELLANEOUS) ×2 IMPLANT
ATTRACTOMAT 16X20 MAGNETIC DRP (DRAPES) IMPLANT
BALL CTTN LRG ABS STRL LF (GAUZE/BANDAGES/DRESSINGS) ×1
BLADE SURG 15 STRL LF DISP TIS (BLADE) ×1 IMPLANT
BLADE SURG 15 STRL SS (BLADE) ×2
CANISTER SUCT 1200ML W/VALVE (MISCELLANEOUS) ×2 IMPLANT
CORD BIPOLAR FORCEPS 12FT (ELECTRODE) ×2 IMPLANT
COTTONBALL LRG STERILE PKG (GAUZE/BANDAGES/DRESSINGS) ×2 IMPLANT
COVER BACK TABLE REUSABLE LG (DRAPES) ×2 IMPLANT
COVER MAYO STAND REUSABLE (DRAPES) ×2 IMPLANT
COVER WAND RF STERILE (DRAPES) IMPLANT
DECANTER SPIKE VIAL GLASS SM (MISCELLANEOUS) ×2 IMPLANT
DERMABOND ADVANCED (GAUZE/BANDAGES/DRESSINGS) ×1
DERMABOND ADVANCED .7 DNX12 (GAUZE/BANDAGES/DRESSINGS) ×1 IMPLANT
DRAIN CHANNEL 10F 3/8 F FF (DRAIN) IMPLANT
DRAPE SPLIT 6X30 W/TAPE (DRAPES) ×2 IMPLANT
DRAPE SURG 17X23 STRL (DRAPES) IMPLANT
ELECT COATED BLADE 2.86 ST (ELECTRODE) ×2 IMPLANT
ELECT PAIRED SUBDERMAL (MISCELLANEOUS) ×2
ELECT REM PT RETURN 9FT ADLT (ELECTROSURGICAL) ×2
ELECTRODE PAIRED SUBDERMAL (MISCELLANEOUS) ×1 IMPLANT
ELECTRODE REM PT RTRN 9FT ADLT (ELECTROSURGICAL) ×1 IMPLANT
EVACUATOR SILICONE 100CC (DRAIN) IMPLANT
FORCEPS BIPOLAR SPETZLER 8 1.0 (NEUROSURGERY SUPPLIES) ×2 IMPLANT
GAUZE 4X4 16PLY RFD (DISPOSABLE) ×2 IMPLANT
GLOVE BIO SURGEON STRL SZ 6.5 (GLOVE) ×2 IMPLANT
GLOVE BIO SURGEON STRL SZ7.5 (GLOVE) ×2 IMPLANT
GLOVE BIOGEL PI IND STRL 7.0 (GLOVE) IMPLANT
GLOVE BIOGEL PI INDICATOR 7.0 (GLOVE) ×2
GOWN STRL REUS W/ TWL LRG LVL3 (GOWN DISPOSABLE) ×2 IMPLANT
GOWN STRL REUS W/TWL LRG LVL3 (GOWN DISPOSABLE) ×4
LOCATOR NERVE 3 VOLT (DISPOSABLE) IMPLANT
NDL HYPO 25X1 1.5 SAFETY (NEEDLE) ×1 IMPLANT
NEEDLE HYPO 25X1 1.5 SAFETY (NEEDLE) ×2 IMPLANT
NS IRRIG 1000ML POUR BTL (IV SOLUTION) ×2 IMPLANT
PACK BASIN DAY SURGERY FS (CUSTOM PROCEDURE TRAY) ×2 IMPLANT
PAD ALCOHOL SWAB (MISCELLANEOUS) ×4 IMPLANT
PENCIL BUTTON HOLSTER BLD 10FT (ELECTRODE) ×2 IMPLANT
PIN SAFETY STERILE (MISCELLANEOUS) IMPLANT
PROBE NERVBE PRASS .33 (MISCELLANEOUS) ×2 IMPLANT
SHEARS HARMONIC 9CM CVD (BLADE) ×2 IMPLANT
SLEEVE SCD COMPRESS KNEE MED (MISCELLANEOUS) ×2 IMPLANT
SPONGE INTESTINAL PEANUT (DISPOSABLE) ×2 IMPLANT
STAPLER VISISTAT 35W (STAPLE) IMPLANT
SUT ETHILON 3 0 PS 1 (SUTURE) IMPLANT
SUT PROLENE 5 0 PS 2 (SUTURE) ×2 IMPLANT
SUT SILK 2 0 PERMA HAND 18 BK (SUTURE) ×6 IMPLANT
SUT SILK 2 0 TIES 17X18 (SUTURE)
SUT SILK 2-0 18XBRD TIE BLK (SUTURE) IMPLANT
SUT SILK 3 0 TIES 17X18 (SUTURE) ×2
SUT SILK 3-0 18XBRD TIE BLK (SUTURE) ×1 IMPLANT
SUT SILK 4 0 TIES 17X18 (SUTURE) IMPLANT
SUT VIC AB 3-0 FS2 27 (SUTURE) IMPLANT
SUT VICRYL 4-0 PS2 18IN ABS (SUTURE) ×2 IMPLANT
SYR BULB 3OZ (MISCELLANEOUS) ×2 IMPLANT
SYR CONTROL 10ML LL (SYRINGE) ×2 IMPLANT
TOWEL GREEN STERILE FF (TOWEL DISPOSABLE) ×2 IMPLANT
TRAY DSU PREP LF (CUSTOM PROCEDURE TRAY) ×2 IMPLANT
TUBE CONNECTING 20X1/4 (TUBING) ×2 IMPLANT

## 2019-04-28 NOTE — Progress Notes (Signed)
Noted.   Orders placed for CRR.   Will contact patient this week.  Noted that she is having surgery today.

## 2019-04-28 NOTE — Op Note (Signed)
DATE OF PROCEDURE:  04/28/2019                              OPERATIVE REPORT  SURGEON:  Leta Baptist, MD  PREOPERATIVE DIAGNOSES: 1. Left parotid mass.  POSTOPERATIVE DIAGNOSES: 1. Left parotid mass.  PROCEDURE PERFORMED:  Left total parotidectomy with dissection and preservation of facial nerve.  ANESTHESIA:  General endotracheal tube anesthesia.  COMPLICATIONS:  None.  ESTIMATED BLOOD LOSS:  3ml  INDICATION FOR PROCEDURE:  Katherine Foley is a 68 y.o. female with an enlarging left parotid tumor. The patient has been complaining of intermittent left ear pain.  She denied any facial weakness.  The patient underwent a neck CT scan.  The CT showed slight enlargement of her left parotid mass. The mass measured 2.2 cm in diameter.  The mass extended from the superficial lobe down to the deep lobe of the left parotid gland.  Based on the above findings, the decision was made for the patient to undergo the above stated procedure. Likelihood of success in reducing symptoms was also discussed.  The risks, benefits, alternatives, and details of the procedure were discussed with the patient.  Questions were invited and answered.  Informed consent was obtained.  DESCRIPTION:  The patient was taken to the operating room and placed supine on the operating table.  General endotracheal tube anesthesia was administered by the anesthesiologist.  The patient was positioned and prepped and draped in a standard fashion for left parotidectomy surgery.  Facial nerve monitoring electrodes were placed.  The facial nerve monitoring system was functional throughout the case.  1% lidocaine with 1-100,000 epinephrine was infiltrated at the planned site of incision.  A standard left preauricular facelift incision was made.  The incision was carried down to the level of the SMAS layer. The skin flap was elevated without difficulty.  A firm parotid mass was noted within the tail of the left parotid gland.  Careful  dissection was carried out along the preauricular fascial plane.  Dissection was carried down to the level of the facial nerve.  The main trunk of the facial nerve was identified and preserved.  The tumor was noted to span the superior and inferior branches of the facial nerve.  The facial nerve branches were dissected free from the parotid tumor.  The tumor was noted to extend deep into the deep parotid lobe.  The entire mass was carefully dissected free from the remaining parotid tissue.  The tumor was sent to the pathology department for permanent histologic identification.  The surgical site was copiously irrigated.  All branches of the facial nerve were noted to be intact and functional.  A #10 JP drain was placed.  The incision was closed in layers with a 4-0 Vicryl and Dermabond. The care of the patient was turned over to the anesthesiologist.  The patient was awakened from anesthesia without difficulty.  The patient was extubated and transferred to the recovery room in good condition.  OPERATIVE FINDINGS: A deep lobe left parotid tumor.  SPECIMEN:  Left parotid tumor.  FOLLOWUP CARE:  The patient will be observed overnight.  The patient will follow up in my office in approximately 1 week.  Katherine Foley 04/28/2019 11:09 AM

## 2019-04-28 NOTE — H&P (Signed)
Cc: Left parotid mass  HPI: The patient is a 68 year old female who returns today for her follow-up evaluation. The patient was last seen 1 month ago.  At that time, she was noted to have a left parotid mass.  The mass was noted on a brain MRI scan in 2017.  The patient felt that the mass has enlarged over the past 2 years.  Currently she complains of intermittent left ear pain.  She denies any facial weakness.  The patient underwent a repeat neck CT scan.  The CT showed slight enlargement of her left parotid mass. The mass measures 2.2 cm in diameter.  The mass extends from the superficial lobe down to the deep lobe of the left parotid gland.  The patient is interested in having the parotid tumor removed. No other ENT, GI, or respiratory issue noted since the last visit.   Exam: General: Communicates without difficulty, well nourished, no acute distress. Head: Normocephalic, no evidence injury, no tenderness, facial buttresses intact without stepoff. Eyes: PERRL, EOMI. No scleral icterus, conjunctivae clear. Neuro: CN II exam reveals vision grossly intact.  No nystagmus at any point of gaze. Ears: Auricles well formed without lesions.  Ear canals are intact without mass or lesion.  No erythema or edema is appreciated.  The TMs are intact without fluid. Nose: External evaluation reveals normal support and skin without lesions.  Dorsum is intact.  Anterior rhinoscopy reveals healthy pink mucosa over anterior aspect of inferior turbinates and intact septum.  No purulence noted. Oral:  Oral cavity and oropharynx are intact, symmetric, without erythema or edema.  Mucosa is moist without lesions. Neck: Full range of motion without pain.  There is no significant lymphadenopathy.  No masses palpable.  Thyroid bed within normal limits to palpation.  Submandibular glands equal bilaterally without mass.  Firm left parotid tail mass noted. Trachea is midline. Neuro:  CN 2-12 grossly intact. Gait normal. Vestibular: No  nystagmus at any point of gaze. The cerebellar examination is unremarkable.   Assessment 1.  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  It has slightly enlarged since 2017.   Plan 1.  The physical exam findings and the CT images are extensively reviewed with the patient.  2.  The treatment options are reviewed.  The options include conservative observation, fine needle aspiration, or surgical excision of the mass.  The risks, benefits, alternatives and details of the treatment options are extensively discussed.  The specific risk of facial nerve injury is also reviewed.  3. The patient would like to proceed with the excision procedure.

## 2019-04-28 NOTE — Transfer of Care (Signed)
Immediate Anesthesia Transfer of Care Note  Patient: Katherine Foley  Procedure(s) Performed: LEFT TOTAL PAROTIDECTOMY (Left Face)  Patient Location: PACU  Anesthesia Type:General  Level of Consciousness: awake and alert   Airway & Oxygen Therapy: Patient Spontanous Breathing and Patient connected to face mask oxygen  Post-op Assessment: Report given to RN and Post -op Vital signs reviewed and stable  Post vital signs: Reviewed and stable  Last Vitals:  Vitals Value Taken Time  BP 148/96 04/28/19 1122  Temp    Pulse 75 04/28/19 1129  Resp 16 04/28/19 1129  SpO2 100 % 04/28/19 1129  Vitals shown include unvalidated device data.  Last Pain:  Vitals:   04/28/19 0814  TempSrc: Oral  PainSc: 0-No pain      Patients Stated Pain Goal: 4 ([id number])  Complications: No apparent anesthesia complications

## 2019-04-28 NOTE — Anesthesia Preprocedure Evaluation (Addendum)
Anesthesia Evaluation  Patient identified by MRN, date of birth, ID band Patient awake    Reviewed: Allergy & Precautions, NPO status , Patient's Chart, lab work & pertinent test results, reviewed documented beta blocker date and time   History of Anesthesia Complications Negative for: history of anesthetic complications  Airway Mallampati: I  TM Distance: >3 FB Neck ROM: Full    Dental  (+) Edentulous Upper, Edentulous Lower   Pulmonary COPD,  COPD inhaler, former smoker,    Pulmonary exam normal        Cardiovascular hypertension, Pt. on home beta blockers and Pt. on medications (-) angina+ CAD, + Past MI, + Cardiac Stents and + Peripheral Vascular Disease  Normal cardiovascular exam   S/p AVR  '19 TTE - mild concentric LVH. EF 60% to 65%. Grade 1 diastolic dysfunction. S/p AVR (32mm [facility] Ease pericardial valve).  Trivial MR, mild TR.    Neuro/Psych  Headaches, PSYCHIATRIC DISORDERS Anxiety Depression    GI/Hepatic Neg liver ROS, hiatal hernia, GERD  Medicated and Controlled,  Endo/Other   Parotid mass Hyponatremia   Renal/GU negative Renal ROS  Female GU complaint     Musculoskeletal negative musculoskeletal ROS (+)   Abdominal   Peds  Hematology negative hematology ROS (+)   Anesthesia Other Findings HSV   Reproductive/Obstetrics                           Anesthesia Physical Anesthesia Plan  ASA: III  Anesthesia Plan: General   Post-op Pain Management:    Induction: Intravenous  PONV Risk Score and Plan: 4 or greater and Treatment may vary due to age or medical condition, Ondansetron, Dexamethasone and Propofol infusion  Airway Management Planned: Oral ETT  Additional Equipment: None  Intra-op Plan:   Post-operative Plan: Extubation in OR  Informed Consent: I have reviewed the patients History and Physical, chart, labs and discussed the procedure including  the risks, benefits and alternatives for the proposed anesthesia with the patient or authorized representative who has indicated his/her understanding and acceptance.     Dental advisory given  Plan Discussed with: CRNA and Anesthesiologist  Anesthesia Plan Comments:        Anesthesia Quick Evaluation

## 2019-04-28 NOTE — Discharge Instructions (Addendum)
Parotidectomy, Care After °This sheet gives you information about how to care for yourself after your procedure. Your health care provider may also give you more specific instructions. If you have problems or questions, contact your health care provider. °What can I expect after the procedure? °After the procedure, it is common to have: °· Pain and mild swelling at the incision site. °· Numbness along the incision. °· Numbness in part or all of your ear. °· Mild jaw discomfort on the surgical side when you are eating or chewing. This may last up to 2-4 weeks. °Follow these instructions at home: °Medicines ° °· Take over-the-counter and prescription medicines only as told by your health care provider. °· If you were prescribed an antibiotic medicine, take it as told by your health care provider. Do not stop taking the antibiotic even if you start to feel better. °· Ask your health care provider if the medicine prescribed to you: °? Requires you to avoid driving or using heavy machinery. °? Can cause constipation. You may need to take actions to prevent or treat constipation, such as: °§ Drink enough fluid to keep your urine pale yellow. °§ Take over-the-counter or prescription medicines. °§ Eat foods that are high in fiber, such as beans, whole grains, and fresh fruits and vegetables. °§ Limit foods that are high in fat and processed sugars, such as fried or sweet foods. °Incision care ° °· Follow instructions from your health care provider about how to take care of your incision. Make sure you: °? Leave stitches (sutures), skin glue, or adhesive strips in place. These skin closures may need to be in place for 2 weeks or longer. If adhesive strip edges start to loosen and curl up, you may trim the loose edges. Do not remove adhesive strips completely unless your health care provider tells you to do that. °· Check your incision area every day for signs of infection. Check for: °? More redness, swelling, or  pain. °? Fluid or blood. °? Warmth. °? Pus or a bad smell. °· Follow your health care provider's instructions about cleaning and maintaining the drain that was placed near your incision. °Eating and drinking °· Follow instructions from your health care provider about eating or drinking restrictions. °· If your mouth or jaw is sore, try eating soft foods until you feel better. °Activity °· Return to your normal activities as told by your health care provider. Ask your health care provider what activities are safe for you. °· Rest as told by your health care provider. °· Avoid sitting for a long time without moving. Get up to take short walks every 1-2 hours. This is important to improve blood flow and breathing. Ask for help if you feel weak or unsteady. °· Do not lift anything that is heavier than 10 lb (4.5 kg), or the limit that you are told, until your health care provider says that it is safe. °General instructions °· Keep your head raised (elevated) when you lie down during the first few weeks after surgery. This will help prevent increased swelling. °· Keep all follow-up visits as told by your health care provider. This is important. °Contact a health care provider if: °· You have pain that does not get better with medicine. °· You have more redness, swelling, or pain around your incision. °· You have fluid or blood coming from your incision. °· Your incision feels warm to the touch. °· You have pus or a bad smell coming from your incision. °·   You vomit or feel nauseous.  You have a fever. Get help right away if:  You have more pain, swelling, or redness that suddenly gets worse at the incision site.  You have increasing numbness or weakness in your face.  You have severe pain. Summary  After the procedure, it is common to have mild jaw discomfort on the surgical side when you are eating or chewing. This may last up to 2-4 weeks.  Follow instructions from your health care provider about how to  take care of your incision.  If your mouth or jaw is sore, try eating soft foods until you feel better.  Return to your normal activities as told by your health care provider. Ask your health care provider what activities are safe for you. This information is not intended to replace advice given to you by your health care provider. Make sure you discuss any questions you have with your health care provider. Document Released: 09/22/2010 Document Revised: 06/09/2018 Document Reviewed: 06/11/2018 Elsevier Patient Education  [facility] Instructions  Activity: Get plenty of rest for the remainder of the day. A responsible individual must stay with you for 24 hours following the procedure.  For the next 24 hours, DO NOT: -Drive a car -Paediatric nurse -Drink alcoholic beverages -Take any medication unless instructed by your physician -Make any legal decisions or sign important papers.  Meals: Start with liquid foods such as gelatin or soup. Progress to regular foods as tolerated. Avoid greasy, spicy, heavy foods. If nausea and/or vomiting occur, drink only clear liquids until the nausea and/or vomiting subsides. Call your physician if vomiting continues.  Special Instructions/Symptoms: Your throat may feel dry or sore from the anesthesia or the breathing tube placed in your throat during surgery. If this causes discomfort, gargle with warm salt water. The discomfort should disappear within 24 hours.  If you had a scopolamine patch placed behind your ear for the management of post- operative nausea and/or vomiting:  1. The medication in the patch is effective for 72 hours, after which it should be removed.  Wrap patch in a tissue and discard in the trash. Wash hands thoroughly with soap and water. 2. You may remove the patch earlier than 72 hours if you experience unpleasant side effects which may include dry mouth, dizziness or visual disturbances. 3.  Avoid touching the patch. Wash your hands with soap and water after contact with the patch.

## 2019-04-28 NOTE — Anesthesia Procedure Notes (Signed)
Procedure Name: Intubation Date/Time: 04/28/2019 9:17 AM Performed by: Lavonia Dana, CRNA Pre-anesthesia Checklist: Patient identified, Emergency Drugs available, Suction available and Patient being monitored Patient Re-evaluated:Patient Re-evaluated prior to induction Oxygen Delivery Method: Circle system utilized Preoxygenation: Pre-oxygenation with 100% oxygen Induction Type: IV induction Ventilation: Mask ventilation without difficulty and Oral airway inserted - appropriate to patient size Laryngoscope Size: Sabra Heck and 2 Grade View: Grade I Tube type: Oral Tube size: 7.0 mm Number of attempts: 1 Airway Equipment and Method: Stylet and Oral airway Placement Confirmation: ETT inserted through vocal cords under direct vision,  positive ETCO2 and breath sounds checked- equal and bilateral Secured at: 22 cm Tube secured with: Tape Dental Injury: Teeth and Oropharynx as per pre-operative assessment

## 2019-04-28 NOTE — Anesthesia Postprocedure Evaluation (Signed)
Anesthesia Post Note  Patient: Katherine Foley  Procedure(s) Performed: LEFT TOTAL PAROTIDECTOMY (Left Face)     Patient location during evaluation: PACU Anesthesia Type: General Level of consciousness: awake and alert Pain management: pain level controlled Vital Signs Assessment: post-procedure vital signs reviewed and stable Respiratory status: spontaneous breathing, nonlabored ventilation and respiratory function stable Cardiovascular status: blood pressure returned to baseline and stable Postop Assessment: no apparent nausea or vomiting Anesthetic complications: no    Last Vitals:  Vitals:   04/28/19 1204 04/28/19 1215  BP:  137/78  Pulse: 71 72  Resp: 16 16  Temp:    SpO2: 96% 97%    Last Pain:  Vitals:   04/28/19 1215  TempSrc:   PainSc: 0-No pain                 Audry Pili

## 2019-04-28 NOTE — Addendum Note (Signed)
Addended by: Denman George B on: 04/28/2019 09:00 AM   Modules accepted: Orders, SmartSet

## 2019-04-29 ENCOUNTER — Encounter (HOSPITAL_BASED_OUTPATIENT_CLINIC_OR_DEPARTMENT_OTHER): Payer: Self-pay | Admitting: Otolaryngology

## 2019-04-29 ENCOUNTER — Telehealth: Payer: Self-pay

## 2019-04-29 DIAGNOSIS — D110 Benign neoplasm of parotid gland: Secondary | ICD-10-CM | POA: Diagnosis not present

## 2019-04-29 NOTE — Discharge Summary (Signed)
Physician Discharge Summary  Patient ID: Katherine Foley MRN: [id number] DOB/AGE: 06-Nov-1950 68 y.o.  Admit date: 04/28/2019 Discharge date: 04/29/2019  Admission Diagnoses: Left parotid mass  Discharge Diagnoses: Left parotid mass Active Problems:   H/O parotidectomy   Discharged Condition: good  Hospital Course: Pt had an uneventful overnight stay. Pt tolerated po well. No bleeding. No stridor.She did c/o left eye pain yesterday, possibly secondary to corneal abrasion. The pain has significantly improved this morning. No significant eye abnormality is noted this morning.  Consults: None  Significant Diagnostic Studies: None  Treatments: surgery: Left deep lobe parotidectomy  Discharge Exam: Blood pressure 117/79, pulse 68, temperature (!) 97.2 F (36.2 C), resp. rate 16, height 5' 2.5" (1.588 m), weight 71.8 kg, SpO2 96 %. Incision/Wound:c/d/i cn7 intact.  Disposition: Discharge disposition: 01-Home or Self Care       Discharge Instructions    Activity as tolerated - No restrictions   Complete by: As directed    Diet general   Complete by: As directed      Allergies as of 04/29/2019      Reactions   Codeine Sulfate Itching, Rash   Hydrocodone-acetaminophen Itching, Rash      Medication List    STOP taking these medications   aspirin 81 MG tablet     TAKE these medications   Albuterol Sulfate 108 (90 Base) MCG/ACT Aepb Commonly known as: ProAir RespiClick Inhale 1 puff into the lungs every 6 (six) hours as needed (wheezing or shortness of breath).   amoxicillin 875 MG tablet Commonly known as: AMOXIL Take 1 tablet (875 mg total) by mouth 2 (two) times daily for 5 days.   cetirizine 10 MG tablet Commonly known as: ZYRTEC Take 10 mg by mouth daily as needed for allergies.   desvenlafaxine 100 MG 24 hr tablet Commonly known as: PRISTIQ Take 1 tablet (100 mg total) by mouth daily.   Dexilant 60 MG capsule Generic drug: dexlansoprazole Take 1  capsule by mouth once daily   fluticasone 50 MCG/ACT nasal spray Commonly known as: FLONASE Use 2 spray(s) in each nostril once daily   hydrochlorothiazide 12.5 MG tablet Commonly known as: HYDRODIURIL Take 1 tablet by mouth once daily   LORazepam 0.5 MG tablet Commonly known as: ATIVAN TAKE 1 TABLET BY MOUTH TWICE DAILY AS NEEDED FOR ANXIETY. DON'T DRIVE 8 HOURS AFTER USING   MULTIVITAMIN ADULT PO Take 1 tablet by mouth daily.   nitroGLYCERIN 0.4 MG SL tablet Commonly known as: NITROSTAT Place 0.4 mg under the tongue every 5 (five) minutes as needed for chest pain.   oxyCODONE-acetaminophen 5-325 MG tablet Commonly known as: Percocet Take 1 tablet by mouth every 4 (four) hours as needed for up to 3 days for severe pain.   propranolol 20 MG tablet Commonly known as: INDERAL Take 1 tablet by mouth twice daily   rosuvastatin 20 MG tablet Commonly known as: CRESTOR TAKE 1 TABLET BY MOUTH ONCE DAILY   sertraline 100 MG tablet Commonly known as: ZOLOFT Take 1 tablet (100 mg total) by mouth daily.   Spiriva HandiHaler 18 MCG inhalation capsule Generic drug: tiotropium Inhale 1 puff by mouth once daily   Symbicort 160-4.5 MCG/ACT inhaler Generic drug: budesonide-formoterol Inhale 2 puffs by mouth twice daily      Follow-up Information    Leta Baptist, MD On 05/05/2019.   Specialty: Otolaryngology Why: at [employer] information: [place] [address] [phone]  Signed: Burley Saver 04/29/2019, 7:27 AM

## 2019-04-29 NOTE — Telephone Encounter (Signed)
Copied from [place] [id number]. Topic: Referral - Status >> Apr 29, 2019 [id number] AM Simone Curia D wrote: [id number] Spoke with patient about free grief counseling  [facility] one peer support that is in person which patient requested.  Patient did receive emailed resources yesterday and will call when she is feeling better.  Will follow up in a few days. Ambrose Mantle [phone]

## 2019-05-08 ENCOUNTER — Telehealth: Payer: Self-pay

## 2019-05-08 NOTE — Telephone Encounter (Signed)
Copied from [place] [id number]. Topic: Referral - Status >> May 08, 2019  [id number] PM Simone Curia D wrote: [id number] Unable to leave message, voicemail is full. Will call again next week regarding community resource for grief counseling. Ambrose Mantle [phone]

## 2019-05-14 ENCOUNTER — Telehealth: Payer: Self-pay

## 2019-05-14 NOTE — Telephone Encounter (Signed)
Copied from [place] [id number]. Topic: Referral - Status >> May 14, 2019 [id number] PM Simone Curia D wrote: [id number] Spoke with patient about grief counseling.  She is still not feeling up to counseling due to her recent surgery but will call later.  Ambrose Mantle [phone]

## 2019-05-25 NOTE — Telephone Encounter (Signed)
Noted  

## 2019-05-30 ENCOUNTER — Other Ambulatory Visit: Payer: Self-pay | Admitting: Family Medicine

## 2019-06-02 ENCOUNTER — Other Ambulatory Visit: Payer: Self-pay | Admitting: Family Medicine

## 2019-06-15 DIAGNOSIS — M533 Sacrococcygeal disorders, not elsewhere classified: Secondary | ICD-10-CM | POA: Diagnosis not present

## 2019-06-16 NOTE — Progress Notes (Signed)
Cardiology Office Note:    Date:  06/17/2019   ID:  Katherine, Foley 04/01/1951, MRN [id number]  PCP:  Marin Olp, MD  Cardiologist:  Sherren Mocha, MD  Electrophysiologist:  None   Referring MD: Marin Olp, MD   Chief Complaint  Patient presents with  . Follow-up    CAD, hx of AVR    History of Present Illness:    Katherine Foley is a 68 y.o. female with:   Coronary artery disease and Aortic Valve Dz s/p CABG/AVR  Coronary artery disease   NSTEMI in 2011 >> Tx with PCI to RCA   S/p PCI to RI in 2012  S/p CABG in 2012 (L-LAD)  Cath in 9/19: RI and RCA stents ok; L-LAD atretic, mild disease in mLAD >> med Rx  Aortic valve Dz  S/p bioprosthetic AVR in 2012  Asthma/COPD   Hypertension   Hyperlipidemia   L parotidectomy (path negative)  Katherine Foley was last seen in 12/2018 via Telemedicine.    She returns for follow-up.  She is here alone.  She still grieving over the loss of her daughter.  She has not had chest pain, shortness of breath, syncope, leg swelling or orthopnea.  She did get a new puppy.  He is 5 months old.  She enjoys spending time with him and it seems to be making her feel better.   Prior CV studies:   The following studies were reviewed today:  Cardiac Catheterization9/23/19 LAD mid 50 RI stent patent LCx mid 25 RCA ost 30, prox stent patent with 25 ISR, dist 25 LIMA known to be atretic    Echo 05/23/18 Mild conc LVH, EF 60-65, no RWMA, Gr 1 DD, s/p AVR without stenosis or significant regurgitation (peak 21), no RVSF, trivial MR, mild TR  Echo 12/26/16 EF 65-70, no RWMA, Gr 1 DD, AVR ok with mean 11, trivial TR  Cardiac Catheterization11/3/17 LM ok LAD mid 30 RI stent patent PCx prox 30 RCA mid stent patent with 30 ISR L-LAD atretic   Echo 12/02/15 EF 65-70, no RWMA, Gr 1 DD, normally functioning AVR (mean 16), trivial MR, trivial TR  Carotid US 12/02/15 1-39% bilateral ICA stenosis. FU as  needed  Nuclear stress test3/31/17 EF 70.Normal pharmacologic nuclear stress test with no evidence of prior infarct or ischemia.   Past Medical History:  Diagnosis Date  . Adenomatous colon polyp   . Anxiety   . ANXIETY DEPRESSION 02/15/2009  . AORTIC STENOSIS 07/07/2010   a. echo 12/23/10: EF 60-65%, mod to severe AS with mean gradient 29 mmHg;  b. 04/2011 s/p bioprosthetic AVR. // Echo 9/19:  Mild conc LVH, EF 60-65, no RWMA, Gr 1 DD, AVR ok (mean 10, peak 21), trivial MR, normal RVSF, mild TR   . Barrett's esophagus 04/14/2007  . Barrett's esophagus 2000  . CAD, NATIVE VESSEL 05/17/2010   a. NSTEMI 8/11 - BMS to RCA;  b. cath 4/12: dLAD 50%, RI 80% (PCI), AVCFX 30%, pRCA 30%, stent ok, 40-50, then 50% dist RCA;  c. s/p Promus DES to RI 12/25/10;  d. 04/2011 CABG x 1 (LIMA->LAD) @ time of AVR  . Carotid stenosis 05/17/2010   a. dopplers 94/50: RICA 38-88%; LICA 2-80% (follow up due 11/12)  . Chronic bronchitis ([place])    "multiple times" (03/25/2013)  . 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 Exertional shortness of breath    "related to COPD problems" (03/25/2013)  . Family history of breast cancer  2 daughters; PALB2 mutation in family  . Genetic testing 02/10/2018   PALB2 analysis at [facility] - Negative for familial mutation  . GERD (gastroesophageal reflux disease)   . H/O hiatal hernia   . Hemorrhoid thrombosis    "had it lanced" (03/25/2013)  . Herpes simplex without mention of complication 6/94/5038  . History of blood transfusion    'w/OHS" (03/25/2013)  . [place], MILD 08/29/2009  . HYPERTENSION 04/14/2007  . MIGRAINE HEADACHE 04/20/2008  . Myocardial infarction [facility]) 2011   "during catherization" (03/25/2013)  . OSTEOPENIA 04/14/2007  . Ovarian cyst   . Parotid mass    left  . Pneumonia    "multiple times" (03/25/2013)  . URINARY INCONTINENCE, STRESS, MILD 08/26/2007  . Urine, incontinence, stress female    Surgical Hx: The patient  has a  past surgical history that includes Tonsillectomy and adenoidectomy; Nasal sinus surgery (1997; 1998); Blepharoplasty (Bilateral, ~ 1998); thrombosed hemorrohid lanced; Ovarian cyst removal (Right, 2011); Appendectomy; Total abdominal hysterectomy; Aortic valve replacement (2012); Coronary angioplasty with stent (2010); Cholecystectomy (N/A, 03/25/2013); Cardiac catheterization (N/A, 07/06/2016); CORONARY ANGIOGRAPHY (N/A, 05/26/2018); and Parotidectomy (Left, 04/28/2019).   Current Medications: Current Meds  Medication Sig  . Albuterol Sulfate (PROAIR RESPICLICK) 882 (90 Base) MCG/ACT AEPB Inhale 1 puff into the lungs every 6 (six) hours as needed (wheezing or shortness of breath).  . cetirizine (ZYRTEC) 10 MG tablet Take 10 mg by mouth daily as needed for allergies.   Marland Kitchen desvenlafaxine (PRISTIQ) 100 MG 24 hr tablet Take 1 tablet (100 mg total) by mouth daily.  Marland Kitchen DEXILANT 60 MG capsule Take 1 capsule by mouth once daily  . fluticasone (FLONASE) 50 MCG/ACT nasal spray Use 2 spray(s) in each nostril once daily  . hydrochlorothiazide (HYDRODIURIL) 12.5 MG tablet Take 1 tablet by mouth once daily  . LORazepam (ATIVAN) 0.5 MG tablet TAKE 1 TABLET BY MOUTH TWICE DAILY AS NEEDED FOR ANXIETY. DON'T DRIVE 8 HOURS AFTER USING  . Multiple Vitamins-Minerals (MULTIVITAMIN ADULT PO) Take 1 tablet by mouth daily.   . nitroGLYCERIN (NITROSTAT) 0.4 MG SL tablet Place 0.4 mg under the tongue every 5 (five) minutes as needed for chest pain.  Marland Kitchen propranolol (INDERAL) 20 MG tablet Take 1 tablet by mouth twice daily  . rosuvastatin (CRESTOR) 20 MG tablet TAKE 1 TABLET BY MOUTH ONCE DAILY  . sertraline (ZOLOFT) 100 MG tablet Take 1 tablet (100 mg total) by mouth daily.  . SYMBICORT 160-4.5 MCG/ACT inhaler Inhale 2 puffs by mouth twice daily  . tiotropium (SPIRIVA HANDIHALER) 18 MCG inhalation capsule Inhale 1 puff by mouth once daily     Allergies:   Codeine sulfate and Hydrocodone-acetaminophen   Social History    Tobacco Use  . Smoking status: Former Smoker    Packs/day: 1.50    Years: 44.00    Pack years: 66.00    Types: Cigarettes    Quit date: 09/03/2005    Years since quitting: 13.7  . Smokeless tobacco: Never Used  Substance Use Topics  . Alcohol use: Yes    Alcohol/week: 0.0 standard drinks    Comment: social  . Drug use: No     Family Hx: The patient's family history includes Asthma in her son; Breast cancer in her daughter; Breast cancer (age of onset: 34) in her daughter; COPD in her father; Cancer in an other family member; Cirrhosis in her sister; Coronary artery disease in an other family member; Heart disease in her father, maternal uncle, mother, and another family member; Rheum  arthritis in her father; Stroke in her brother. There is no history of Colon cancer or Colon polyps.  ROS:   Please see the history of present illness.    ROS All other systems reviewed and are negative.   EKGs/Labs/Other Test Reviewed:    EKG:  EKG is  ordered today.  The ekg ordered today demonstrates normal sinus rhythm, heart rate 62, normal axis, T wave inversions V4-V6, QTC 406, no change from prior tracings  Recent Labs: 04/24/2019: BUN 17; Creatinine, Ser 0.58; Potassium 4.1; Sodium 131   Recent Lipid Panel Lab Results  Component Value Date/Time   CHOL 194 03/20/2017 08:22 AM   TRIG 138.0 03/20/2017 08:22 AM   HDL 73.70 03/20/2017 08:22 AM   CHOLHDL 3 03/20/2017 08:22 AM   LDLCALC 92 03/20/2017 08:22 AM   LDLDIRECT 65.0 06/19/2017 09:01 AM    Physical Exam:    VS:  BP 134/78   Pulse 63   Ht 5' 2.5" (1.588 m)   Wt 158 lb (71.7 kg)   SpO2 95%   BMI 28.44 kg/m     Wt Readings from Last 3 Encounters:  06/17/19 158 lb (71.7 kg)  04/28/19 158 lb 4.6 oz (71.8 kg)  04/16/19 162 lb (73.5 kg)     Physical Exam  Constitutional: She is oriented to person, place, and time. She appears well-developed and well-nourished. No distress.  HENT:  Head: Normocephalic and atraumatic.  Eyes:  No scleral icterus.  Neck: No JVD present. No thyromegaly present.  Cardiovascular: Normal rate, regular rhythm and intact distal pulses.  Murmur heard.  Low-pitched crescendo-decrescendo systolic murmur is present with a grade of 2/6 at the upper right sternal border. Pulmonary/Chest: Effort normal and breath sounds normal. She has no rales.  Abdominal: Soft. There is no hepatomegaly.  Musculoskeletal:        General: No edema.  Lymphadenopathy:    She has no cervical adenopathy.  Neurological: She is alert and oriented to person, place, and time.  Skin: Skin is warm and dry.  Psychiatric: She has a normal mood and affect.    ASSESSMENT & PLAN:    1. Coronary artery disease involving native coronary artery of native heart without angina pectoris History of stenting to the RCA and ramus intermediate and subsequent CABG in 2012.  Cardiac catheterization in 2019 demonstrated patent stents in the RCA and ramus intermediate.  There was mild nonobstructive disease in the LAD and the LIMA was known to be atretic.  She has not had anginal symptoms.  Her ECG is unchanged.  She continues to deal with the grief over the loss of her daughter last December.  Her birthday is tomorrow.  Continue ASA, statin.   2. Aortic valve disease 3. S/P AVR (aortic valve replacement) Echo in 2019 with stable aortic valve replacement.  No symptoms to suggest worsening stenosis.  Continue SBE prophylaxis.  Continue aspirin.  4. Essential hypertension BP somewhat above target.  I have asked her to continue to monitor her BP at home.    5. Hyperlipidemia LDL goal <70 Continue statin Rx.  Arrange fasting CMET, Lipids.    Dispo:  Return in about 1 year (around 06/16/2020) for Routine Follow Up, w/ Dr. Burt Knack, or Richardson Dopp, PA-C, (virtual or in-person).   Medication Adjustments/Labs and Tests Ordered: Current medicines are reviewed at length with the patient today.  Concerns regarding medicines are outlined  above.  Tests Ordered: Orders Placed This Encounter  Procedures  . Comprehensive metabolic panel  .  Lipid panel  . EKG 12-Lead   Medication Changes: No orders of the defined types were placed in this encounter.   Signed, Richardson Dopp, PA-C  06/17/2019 10:56 AM    [facility] [address], [place], [place]  [address] Phone: [phone]; Fax: [phone]

## 2019-06-17 ENCOUNTER — Encounter: Payer: Self-pay | Admitting: Physician Assistant

## 2019-06-17 ENCOUNTER — Other Ambulatory Visit: Payer: Self-pay

## 2019-06-17 ENCOUNTER — Ambulatory Visit: Payer: PPO | Admitting: Physician Assistant

## 2019-06-17 ENCOUNTER — Telehealth: Payer: Self-pay | Admitting: Physician Assistant

## 2019-06-17 VITALS — BP 134/78 | HR 63 | Ht 62.5 in | Wt 158.0 lb

## 2019-06-17 DIAGNOSIS — I10 Essential (primary) hypertension: Secondary | ICD-10-CM | POA: Diagnosis not present

## 2019-06-17 DIAGNOSIS — Z952 Presence of prosthetic heart valve: Secondary | ICD-10-CM | POA: Diagnosis not present

## 2019-06-17 DIAGNOSIS — I2510 Atherosclerotic heart disease of native coronary artery without angina pectoris: Secondary | ICD-10-CM | POA: Diagnosis not present

## 2019-06-17 DIAGNOSIS — E785 Hyperlipidemia, unspecified: Secondary | ICD-10-CM

## 2019-06-17 DIAGNOSIS — I359 Nonrheumatic aortic valve disorder, unspecified: Secondary | ICD-10-CM

## 2019-06-17 MED ORDER — ASPIRIN EC 81 MG PO TBEC: 81.0000 mg | DELAYED_RELEASE_TABLET | Freq: Every day | ORAL | Status: AC

## 2019-06-17 NOTE — Patient Instructions (Addendum)
Medication Instructions:  Your physician recommends that you continue on your current medications as directed. Please refer to the Current Medication list given to you today.  If you need a refill on your cardiac medications before your next appointment, please call your pharmacy.   Lab work: TO BE DONE IN 2 WEEKS: FASTING CMET, LIPIDS ON 10/28 If you have labs (blood work) drawn today and your tests are completely normal, you will receive your results only by: Marland Kitchen MyChart Message (if you have MyChart) OR . A paper copy in the mail If you have any lab test that is abnormal or we need to change your treatment, we will call you to review the results.  Testing/Procedures: NONE  Follow-Up: At [facility], you and your health needs are our priority.  As part of our continuing mission to provide you with exceptional heart care, we have created designated Provider Care Teams.  These Care Teams include your primary Cardiologist (physician) and Advanced Practice Providers (APPs -  Physician Assistants and Nurse Practitioners) who all work together to provide you with the care you need, when you need it. You will need a follow up appointment in:  12 months.  Please call our office 2 months in advance to schedule this appointment.  You may see Sherren Mocha, MD or one of the following Advanced Practice Providers on your designated Care Team: Richardson Dopp, PA-C [place], [place] . Daune Perch, NP

## 2019-06-17 NOTE — Telephone Encounter (Signed)
It looks like Aspirin is no longer on her medication list. Please make sure she is taking ASA 81 mg once daily. I will add it back to her list. Richardson Dopp, PA-C    06/17/2019 10:55 AM

## 2019-06-17 NOTE — Telephone Encounter (Signed)
I left a message for the patient to return my call.

## 2019-06-25 DIAGNOSIS — M533 Sacrococcygeal disorders, not elsewhere classified: Secondary | ICD-10-CM | POA: Diagnosis not present

## 2019-07-01 ENCOUNTER — Other Ambulatory Visit: Payer: PPO | Admitting: *Deleted

## 2019-07-01 ENCOUNTER — Other Ambulatory Visit: Payer: Self-pay

## 2019-07-01 DIAGNOSIS — Z952 Presence of prosthetic heart valve: Secondary | ICD-10-CM

## 2019-07-01 DIAGNOSIS — I359 Nonrheumatic aortic valve disorder, unspecified: Secondary | ICD-10-CM

## 2019-07-01 DIAGNOSIS — I2510 Atherosclerotic heart disease of native coronary artery without angina pectoris: Secondary | ICD-10-CM

## 2019-07-01 DIAGNOSIS — I10 Essential (primary) hypertension: Secondary | ICD-10-CM

## 2019-07-01 DIAGNOSIS — E785 Hyperlipidemia, unspecified: Secondary | ICD-10-CM | POA: Diagnosis not present

## 2019-07-01 LAB — COMPREHENSIVE METABOLIC PANEL
ALT: 25 IU/L (ref 0–32)
AST: 19 IU/L (ref 0–40)
Albumin/Globulin Ratio: 1.8 (ref 1.2–2.2)
Albumin: 4.2 g/dL (ref 3.8–4.8)
Alkaline Phosphatase: 82 IU/L (ref 39–117)
BUN/Creatinine Ratio: 21 (ref 12–28)
BUN: 12 mg/dL (ref 8–27)
Bilirubin Total: 0.4 mg/dL (ref 0.0–1.2)
CO2: 26 mmol/L (ref 20–29)
Calcium: 9.1 mg/dL (ref 8.7–10.3)
Chloride: 90 mmol/L — ABNORMAL LOW (ref 96–106)
Creatinine, Ser: 0.57 mg/dL (ref 0.57–1.00)
GFR calc Af Amer: 109 mL/min/{1.73_m2} (ref >59)
GFR calc non Af Amer: 94 mL/min/{1.73_m2} (ref >59)
Globulin, Total: 2.3 g/dL (ref 1.5–4.5)
Glucose: 101 mg/dL — ABNORMAL HIGH (ref 65–99)
Potassium: 4.4 mmol/L (ref 3.5–5.2)
Sodium: 131 mmol/L — ABNORMAL LOW (ref 134–144)
Total Protein: 6.5 g/dL (ref 6.0–8.5)

## 2019-07-01 LAB — LIPID PANEL
Chol/HDL Ratio: 2.3 ratio (ref 0.0–4.4)
Cholesterol, Total: 163 mg/dL (ref 100–199)
HDL: 70 mg/dL (ref >39)
LDL Chol Calc (NIH): 68 mg/dL (ref 0–99)
Triglycerides: 146 mg/dL (ref 0–149)
VLDL Cholesterol Cal: 25 mg/dL (ref 5–40)

## 2019-08-02 ENCOUNTER — Other Ambulatory Visit: Payer: Self-pay | Admitting: Family Medicine

## 2019-08-11 DIAGNOSIS — M533 Sacrococcygeal disorders, not elsewhere classified: Secondary | ICD-10-CM | POA: Diagnosis not present

## 2019-08-24 ENCOUNTER — Telehealth: Payer: Self-pay | Admitting: Family Medicine

## 2019-08-24 ENCOUNTER — Other Ambulatory Visit: Payer: Self-pay

## 2019-08-24 NOTE — Telephone Encounter (Signed)
Patient called in and said she is in pain in her leg she think it might be from restless leg and she wanted to know if there was anything to do that could help the pain. She is scheduled for 08/25/19 at 3:40pm. Please advise.

## 2019-08-24 NOTE — Telephone Encounter (Signed)
I look forward to seeing her tomorrow-anything needed from me earlier than that?

## 2019-08-24 NOTE — Telephone Encounter (Signed)
Called patient she states she thinks she has restless leg syndrome. She has been having "jumping" in left leg for about two months. She has not been evaluated for this by anyone. She has "self diagnosed". States that she has tried heat and holding leg down but nothing helps. She has an appointment for [facility] at ortho tomorrow as well as an appointment in office with you. She would like to keep both because she is having increased sciatic pain as well.

## 2019-08-25 ENCOUNTER — Ambulatory Visit (INDEPENDENT_AMBULATORY_CARE_PROVIDER_SITE_OTHER): Payer: PPO | Admitting: Family Medicine

## 2019-08-25 ENCOUNTER — Encounter: Payer: Self-pay | Admitting: Family Medicine

## 2019-08-25 VITALS — BP 140/90 | HR 90 | Temp 96.0°F | Ht 62.5 in | Wt 153.4 lb

## 2019-08-25 DIAGNOSIS — F331 Major depressive disorder, recurrent, moderate: Secondary | ICD-10-CM | POA: Diagnosis not present

## 2019-08-25 DIAGNOSIS — M533 Sacrococcygeal disorders, not elsewhere classified: Secondary | ICD-10-CM | POA: Diagnosis not present

## 2019-08-25 DIAGNOSIS — G2581 Restless legs syndrome: Secondary | ICD-10-CM

## 2019-08-25 DIAGNOSIS — I10 Essential (primary) hypertension: Secondary | ICD-10-CM | POA: Diagnosis not present

## 2019-08-25 MED ORDER — GABAPENTIN 300 MG PO CAPS: 300.0000 mg | ORAL_CAPSULE | Freq: Every day | ORAL | 5 refills | Status: DC

## 2019-08-25 NOTE — Patient Instructions (Addendum)
Please call [phone] to schedule a visit with [facility] -Trey Paula is an excellent counselor who is based out of our clinic -could also call [facility] back and see if they are able to do one on one grief counseling  Crisis number  After hours number: [phone] Jol[facility] Click Crisis number: [phone] _____________________________________ Schedule labs before you leave- we need to see how iron stores are doing Try gabapentin before bed. If not helpful can move to an hour before bed or even 2 hours but wait about a week before moving it back   Recommended follow [id number] in about 1 month (around 09/25/2019) for video follow up to see how you are doing.

## 2019-08-25 NOTE — Progress Notes (Signed)
Phone [phone] In person visit   Subjective:   Katherine Foley is a 68 y.o. year old very pleasant female patient who presents for/with See problem oriented charting Chief Complaint  Patient presents with  . Leg Pain    ROS- no fever/chills . Continued issues with sleep and grieving/depression  This visit occurred during the SARS-CoV-2 public health emergency.  Safety protocols were in place, including screening questions prior to the visit, additional usage of staff PPE, and extensive cleaning of exam room while observing appropriate contact time as indicated for disinfecting solutions.   Past Medical History-  Patient Active Problem List   Diagnosis Date Noted  . H/O parotidectomy 04/28/2019    Priority: High  . 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 Depression 09/22/2015    Priority: High  . Former smoker 09/22/2015    Priority: High  . S/P AVR (aortic valve replacement) 11/06/2013    Priority: High  . Aortic stenosis 07/07/2010    Priority: High  . CAD (coronary artery disease) 05/17/2010    Priority: High  . Restless legs 08/25/2019    Priority: Medium  . Hot flashes 03/22/2016    Priority: Medium  . Diastolic dysfunction 123456    Priority: Medium  . GERD (gastroesophageal reflux disease) 06/04/2014    Priority: Medium  . Carotid stenosis 05/17/2010    Priority: Medium  . HLD (hyperlipidemia) 08/29/2009    Priority: Medium  . Essential hypertension 04/14/2007    Priority: Medium  . Osteopenia 04/14/2007    Priority: Medium  . Genetic testing 02/10/2018    Priority: Low  . Family history of breast cancer     Priority: Low  . Allergic rhinitis 09/22/2015    Priority: Low  . Adenomatous colon polyp     Priority: Low  . Herpes simplex virus (HSV) infection 01/16/2010    Priority: Low    Medications- reviewed and updated Current Outpatient Medications  Medication Sig Dispense Refill  . Albuterol Sulfate (PROAIR RESPICLICK)  [id number] (90 Base) MCG/ACT AEPB Inhale 1 puff into the lungs every 6 (six) hours as needed (wheezing or shortness of breath). 1 each 3  . aspirin EC 81 MG tablet Take 1 tablet (81 mg total) by mouth daily.    . cetirizine (ZYRTEC) 10 MG tablet Take 10 mg by mouth daily as needed for allergies.     Marland Kitchen desvenlafaxine (PRISTIQ) 100 MG 24 hr tablet Take 1 tablet (100 mg total) by mouth daily. 90 tablet 3  . DEXILANT 60 MG capsule Take 1 capsule by mouth once daily 90 capsule 1  . fluticasone (FLONASE) 50 MCG/ACT nasal spray Use 2 spray(s) in each nostril once daily 16 g 0  . hydrochlorothiazide (HYDRODIURIL) 12.5 MG tablet Take 1 tablet by mouth once daily 30 tablet 0  . LORazepam (ATIVAN) 0.5 MG tablet TAKE 1 TABLET BY MOUTH TWICE DAILY AS NEEDED FOR ANXIETY. DON'T DRIVE 8 HOURS AFTER USING 60 tablet 0  . Multiple Vitamins-Minerals (MULTIVITAMIN ADULT PO) Take 1 tablet by mouth daily.     . nitroGLYCERIN (NITROSTAT) 0.4 MG SL tablet Place 0.4 mg under the tongue every 5 (five) minutes as needed for chest pain.    Marland Kitchen propranolol (INDERAL) 20 MG tablet Take 1 tablet by mouth twice daily 180 tablet 0  . rosuvastatin (CRESTOR) 20 MG tablet Take 1 tablet by mouth once daily 30 tablet 0  . sertraline (ZOLOFT) 100 MG tablet Take 1 tablet (100 mg total) by  mouth daily. 30 tablet 5  . SYMBICORT 160-4.5 MCG/ACT inhaler Inhale 2 puffs by mouth twice daily 11 g 2  . tiotropium (SPIRIVA HANDIHALER) 18 MCG inhalation capsule Inhale 1 puff by mouth once daily 90 capsule 0  . gabapentin (NEURONTIN) 300 MG capsule Take 1 capsule (300 mg total) by mouth at bedtime. 30 capsule 5   No current facility-administered medications for this visit.     Objective:  BP 140/90   Pulse 90   Temp (!) 96 F (35.6 C) (Temporal)   Ht 5' 2.5" (1.588 m)   Wt 153 lb 6.4 oz (69.6 kg)   SpO2 97%   BMI 27.61 kg/m  Gen: NAD, resting comfortably CV: RRR stable murmur Lungs: CTAB no crackles, wheeze, rhonchi  Ext: no edema Skin: warm,  dry Neuro: grossly normal, moves all extremities     Assessment and Plan   # Restless legs S: Patient she states she thinks she has restless leg syndrome. She has been having worsening  "jumping" in left leg for about two months. Some restlessness in legs for about a year. She has not been evaluated for this by anyone. She has "self diagnosed". She states has to move legs constantly at night- worse on the left.   States that she has tried heat and holding leg down but nothing helps. She had an appointment for epidural steroid injection at ortho today as well-increased sciatic pain as well- got 2nd injection today. She has tried Mustard, massaging, stretching as well A/P: Appears to have restless legs. We went back over last few months and seems like symptoms worsened when she came off gabapentin from orthopedic purposes. We opted to restart since she has tolerated this in past and helped with sleep. Also will check ferritin and try to get ferritin levels over 75 most likely.    # Grief /depression S:Patent has had increased grief and depression. Hard this time with the holidays and missing Angie (her oldest daughter and close friend) last october. Has not put up a Christmas tree in 3 years- this year at least because she has a puppy who she is enjoying.   PHQ9 trending down slightly but still elevated at 13. Compliant with sertraline 100mg  and pristiq 100mg  (had been started by prior physician) but we have continued.  Also suffers from anxiety- still has some ativan left.   Denies suicidal thoughts at present- she did at one point have brief thoughts but did not develop a plan. She was receiving therapy from hospice but due to covid that was put on hold. She has tried therapy at [facility] but did not feel any connection to the type of group she was placed in- was hard to hear tragic stories of other patients. Patient has contact number to emergency hot line but will provide to  patient to make sure available.   Also misses church support and her Bible study. covid has been particularly hard for her A/P: Grief/Depression with poor control with phq9 at 13 though this has tended down with increase in zoloft to 100mg . She is also on pristiq. Ideally would titrate her off of zoloft but she is really struggling right now- discussed adding counseling and following up in 4-weeks. Can use ativan for depression  #hypertension S: compliant with  Hydrochlorothiazide 12.5 mg and propranolol 20 mg BID BP Readings from Last 3 Encounters:  08/25/19 140/90  06/17/19 134/78  04/29/19 117/79  A/P: mild poor control today- has been controlled last few  visits and she is particularly stressed about missing daughter today- will update labs but hold off on changes for now.  -check back in at 4-6 week follow up  - update bmp with labs- if still with hyponatremia potentially trial off of hydrochlorothiazide and add amlodipine 2.5 mg - was on bisoprolol in past but in one note mentions BP was low, in another note mentioned propranolol more helpful for tremor  Recommended follow up: Return in about 1 month (around 09/25/2019) for video follow up to see how you are doing. Future Appointments  Date Time Provider [place]  08/26/2019  8:30 AM [facility] LAB [facility]  09/28/2019  4:20 PM Marin Olp, MD [facility]    Lab/Order associations:   ICD-10-CM   1. Restless legs  G25.81 Ferritin panel. new 10/2018  2. Essential hypertension  I10 CMET future    CBC future  3. Moderate episode of recurrent major depressive disorder ([place])  F33.1     Meds ordered this encounter  Medications  . gabapentin (NEURONTIN) 300 MG capsule    Sig: Take 1 capsule (300 mg total) by mouth at bedtime.    Dispense:  30 capsule    Refill:  5   Return precautions advised.  Garret Reddish, MD

## 2019-08-25 NOTE — Assessment & Plan Note (Signed)
#   Grief  S:Patent has had increased grief and depression. Hard this time with the holidays and missing Angie (her oldest daughter and close friend) last october. Has not put up a Christmas tree in 3 years- this year at least because she has a puppy who she is enjoying.   PHQ9 trending down slightly but still elevated at 13. Compliant with sertraline 100mg  and pristiq 100mg  (had been started by prior physician) but we have continued.  Also suffers from anxiety- still has some ativan left.   Denies suicidal thoughts at present- she did at one point have brief thoughts but did not develop a plan. She was receiving therapy from hospice but due to covid that was put on hold. She has tried therapy at [facility] but did not feel any connection to the type of group she was placed in- was hard to hear tragic stories of other patients. Patient has contact number to emergency hot line but will provide to patient to make sure available.   Also misses church support and her Bible study. covid has been particularly hard for her A/P: Grief/Depression with poor control with phq9 at 13 though this has tended down with increase in zoloft to 100mg . She is also on pristiq. Ideally would titrate her off of zoloft but she is really struggling right now- discussed adding counseling and following up in 4-weeks. Can use ativan for depression

## 2019-08-25 NOTE — Assessment & Plan Note (Signed)
S: compliant with  Hydrochlorothiazide 12.5 mg and propranolol 20 mg BID BP Readings from Last 3 Encounters:  08/25/19 140/90  06/17/19 134/78  04/29/19 117/79  A/P: mild poor control today- has been controlled last few visits and she is particularly stressed about missing daughter today- will update labs but hold off on changes for now.  - update bmp with labs- if still with hypoatremia potentially trial off of hydrochlorothiazide and add amlodipine 2.5 mg - was on bisoprolol in past but in one note mentions BP was low, in another note mentioned propranolol more helpful for tremor

## 2019-08-25 NOTE — Telephone Encounter (Signed)
Will address at visit today.

## 2019-08-26 ENCOUNTER — Other Ambulatory Visit (INDEPENDENT_AMBULATORY_CARE_PROVIDER_SITE_OTHER): Payer: PPO

## 2019-08-26 DIAGNOSIS — I10 Essential (primary) hypertension: Secondary | ICD-10-CM | POA: Diagnosis not present

## 2019-08-26 DIAGNOSIS — G2581 Restless legs syndrome: Secondary | ICD-10-CM

## 2019-08-26 LAB — COMPREHENSIVE METABOLIC PANEL
ALT: 17 U/L (ref 0–35)
AST: 19 U/L (ref 0–37)
Albumin: 4.6 g/dL (ref 3.5–5.2)
Alkaline Phosphatase: 58 U/L (ref 39–117)
BUN: 20 mg/dL (ref 6–23)
CO2: 26 mEq/L (ref 19–32)
Calcium: 9.9 mg/dL (ref 8.4–10.5)
Chloride: 91 mEq/L — ABNORMAL LOW (ref 96–112)
Creatinine, Ser: 0.65 mg/dL (ref 0.40–1.20)
GFR: 90.01 mL/min (ref >60.00)
Glucose, Bld: 117 mg/dL — ABNORMAL HIGH (ref 70–99)
Potassium: 4.3 mEq/L (ref 3.5–5.1)
Sodium: 129 mEq/L — ABNORMAL LOW (ref 135–145)
Total Bilirubin: 0.4 mg/dL (ref 0.2–1.2)
Total Protein: 6.7 g/dL (ref 6.0–8.3)

## 2019-08-26 LAB — IBC + FERRITIN
Ferritin: 16.3 ng/mL (ref 10.0–291.0)
Iron: 35 ug/dL — ABNORMAL LOW (ref 42–145)
Saturation Ratios: 7.4 % — ABNORMAL LOW (ref 20.0–50.0)
Transferrin: 339 mg/dL (ref 212.0–360.0)

## 2019-08-26 LAB — CBC
HCT: 37.8 % (ref 36.0–46.0)
Hemoglobin: 12.4 g/dL (ref 12.0–15.0)
MCHC: 32.8 g/dL (ref 30.0–36.0)
MCV: 86.9 fl (ref 78.0–100.0)
Platelets: 259 10*3/uL (ref 150.0–400.0)
RBC: 4.34 Mil/uL (ref 3.87–5.11)
RDW: 15.3 % (ref 11.5–15.5)
WBC: 8.7 10*3/uL (ref 4.0–10.5)

## 2019-08-31 ENCOUNTER — Other Ambulatory Visit: Payer: Self-pay

## 2019-08-31 ENCOUNTER — Telehealth: Payer: Self-pay

## 2019-08-31 MED ORDER — AMLODIPINE BESYLATE 2.5 MG PO TABS: 2.5000 mg | ORAL_TABLET | Freq: Every day | ORAL | 3 refills | Status: DC

## 2019-08-31 NOTE — Telephone Encounter (Signed)
Copied from [place] [id number]. Topic: General - Other >> Aug 31, 2019  2:52 PM Greggory Keen D wrote: Reason for CRM: pt called saying she read a message on her MY Chart that said Amlodipine was going got be sen to  her pharmacy due to her labs being a abnormal.  She checked with the pharmacy and it has not been sent in yet,  CB# [phone]

## 2019-08-31 NOTE — Telephone Encounter (Signed)
Rx sent in to pharmacy. 

## 2019-09-02 ENCOUNTER — Other Ambulatory Visit: Payer: Self-pay | Admitting: Family Medicine

## 2019-09-06 ENCOUNTER — Other Ambulatory Visit: Payer: Self-pay | Admitting: Family Medicine

## 2019-09-07 NOTE — Telephone Encounter (Signed)
Patient calling to check status of this medication. Patient very upset and requesting to speak with CMA. Patient states she has been out of this medication for five days.

## 2019-09-14 ENCOUNTER — Other Ambulatory Visit: Payer: Self-pay | Admitting: Family Medicine

## 2019-09-15 DIAGNOSIS — L309 Dermatitis, unspecified: Secondary | ICD-10-CM | POA: Diagnosis not present

## 2019-09-28 ENCOUNTER — Ambulatory Visit (INDEPENDENT_AMBULATORY_CARE_PROVIDER_SITE_OTHER): Payer: PPO | Admitting: Family Medicine

## 2019-09-28 ENCOUNTER — Encounter: Payer: Self-pay | Admitting: Family Medicine

## 2019-09-28 VITALS — BP 148/85 | Ht 62.5 in | Wt 153.0 lb

## 2019-09-28 DIAGNOSIS — I10 Essential (primary) hypertension: Secondary | ICD-10-CM | POA: Diagnosis not present

## 2019-09-28 DIAGNOSIS — F331 Major depressive disorder, recurrent, moderate: Secondary | ICD-10-CM | POA: Diagnosis not present

## 2019-09-28 DIAGNOSIS — G2581 Restless legs syndrome: Secondary | ICD-10-CM

## 2019-09-28 MED ORDER — AMLODIPINE BESYLATE 5 MG PO TABS: 5.0000 mg | ORAL_TABLET | Freq: Every day | ORAL | 3 refills | Status: DC

## 2019-09-28 MED ORDER — LORAZEPAM 0.5 MG PO TABS: ORAL_TABLET | ORAL | 0 refills | Status: DC

## 2019-09-28 NOTE — Progress Notes (Signed)
Phone [phone] Virtual visit via Video note   Subjective:  Chief complaint: Chief Complaint  Patient presents with  . virtual  . F/U on medication    This visit type was conducted due to national recommendations for restrictions regarding the COVID-19 Pandemic (e.g. social distancing).  This format is felt to be most appropriate for this patient at this time balancing risks to patient and risks to population by having him in for in person visit.  No physical exam was performed (except for noted visual exam or audio findings with Telehealth visits).    Our team/I connected with [name] at  4:20 PM EST by a video enabled telemedicine application ([email or web address] or caregility through epic) and verified that I am speaking with the correct person using two identifiers.  Location patient: [place] Location provider: [facility], office Persons participating in the virtual visit:  patient  Our team/I discussed the limitations of evaluation and management by telemedicine and the availability of in person appointments. In light of current covid-19 pandemic, patient also understands that we are trying to protect them by minimizing in office contact if at all possible.  The patient expressed consent for telemedicine visit and agreed to proceed. Patient understands insurance will be billed.   Past Medical History-  Patient Active Problem List   Diagnosis Date Noted  . H/O parotidectomy 04/28/2019    Priority: High  . 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 Depression 09/22/2015    Priority: High  . Former smoker 09/22/2015    Priority: High  . S/P AVR (aortic valve replacement) 11/06/2013    Priority: High  . Aortic stenosis 07/07/2010    Priority: High  . CAD (coronary artery disease) 05/17/2010    Priority: High  . Restless legs 08/25/2019    Priority: Medium  . Hot flashes 03/22/2016    Priority: Medium  . Diastolic dysfunction 123456    Priority: Medium   . GERD (gastroesophageal reflux disease) 06/04/2014    Priority: Medium  . Carotid stenosis 05/17/2010    Priority: Medium  . HLD (hyperlipidemia) 08/29/2009    Priority: Medium  . Essential hypertension 04/14/2007    Priority: Medium  . Osteopenia 04/14/2007    Priority: Medium  . Genetic testing 02/10/2018    Priority: Low  . Family history of breast cancer     Priority: Low  . Allergic rhinitis 09/22/2015    Priority: Low  . Adenomatous colon polyp     Priority: Low  . Herpes simplex virus (HSV) infection 01/16/2010    Priority: Low    Medications- reviewed and updated Current Outpatient Medications  Medication Sig Dispense Refill  . Albuterol Sulfate (PROAIR RESPICLICK) [id number] (90 Base) MCG/ACT AEPB Inhale 1 puff into the lungs every 6 (six) hours as needed (wheezing or shortness of breath). 1 each 3  . amLODipine (NORVASC) 5 MG tablet Take 1 tablet (5 mg total) by mouth daily. 90 tablet 3  . aspirin EC 81 MG tablet Take 1 tablet (81 mg total) by mouth daily.    . cetirizine (ZYRTEC) 10 MG tablet Take 10 mg by mouth daily as needed for allergies.     Marland Kitchen desvenlafaxine (PRISTIQ) 100 MG 24 hr tablet Take 1 tablet (100 mg total) by mouth daily. 90 tablet 3  . DEXILANT 60 MG capsule Take 1 capsule by mouth once daily 90 capsule 1  . fluticasone (FLONASE) 50 MCG/ACT nasal spray Use 2 spray(s) in each nostril once daily  16 g 0  . gabapentin (NEURONTIN) 300 MG capsule Take 1 capsule (300 mg total) by mouth at bedtime. 30 capsule 5  . LORazepam (ATIVAN) 0.5 MG tablet TAKE 1 TABLET BY MOUTH TWICE DAILY AS NEEDED FOR ANXIETY. DON'T DRIVE 8 HOURS AFTER USING 60 tablet 0  . Multiple Vitamins-Minerals (MULTIVITAMIN ADULT PO) Take 1 tablet by mouth daily.     . nitroGLYCERIN (NITROSTAT) 0.4 MG SL tablet Place 0.4 mg under the tongue every 5 (five) minutes as needed for chest pain.    Marland Kitchen propranolol (INDERAL) 20 MG tablet Take 1 tablet by mouth twice daily 180 tablet 0  . rosuvastatin  (CRESTOR) 20 MG tablet Take 1 tablet by mouth once daily 30 tablet 0  . sertraline (ZOLOFT) 100 MG tablet Take 1 tablet (100 mg total) by mouth daily. 30 tablet 5  . SPIRIVA HANDIHALER 18 MCG inhalation capsule Inhale 1 puff by mouth once daily 90 capsule 0  . SYMBICORT 160-4.5 MCG/ACT inhaler Inhale 2 puffs by mouth twice daily 11 g 2   No current facility-administered medications for this visit.     Objective:  BP (!) 148/85   Ht 5' 2.5" (1.588 m)   Wt 153 lb (69.4 kg)   BMI 27.54 kg/m  self reported vitals Gen: NAD, resting comfortably Lungs: nonlabored, normal respiratory rate  Skin: appears dry, no obvious rash     Assessment and Plan   #hypertension S: compliant with  amlodipine 2.5 mg and propranolol 20 mg BID BP Readings from Last 3 Encounters:  09/28/19 (!) 148/85  08/25/19 140/90  06/17/19 134/78  A/P: Patient with poor control on home checks despite addition of amlodipine the diastolic has improved.  We previously stopped hydrochlorothiazide due to hyponatremia.  Plan was for repeat today BMP but was scheduled as a virtual visit.  Due to continued poor control we will increase amlodipine to 5 mg and continue propranolol-advised to follow-up within a month for recheck  #Restless legs S:ferritin level 16 last visit- she is now on iron twice a week. Mild improvement in symptoms on the iron.  Restarted gabapentin 300mg - not sure if it has helped much and causes some flushing in cheeks-minimally evident on camera. A/P: I am not sure why patient does not think the gabapentin is what improved her symptoms-she does not feel strongly about increasing the dose for now so we will continue though I offered stopping to see if symptoms worsen.  Patient plans to continue iron and we can check ferritin levels at follow-up though discussed with only short interval may not have drastically improved-discussed long-term goal of 75    # Depression/grieving S: Patient continues to grieve the  loss of daughter [name] who was also her best friend.  PHQ-9 remains elevated but she actually feels like her symptoms have improved somewhat.She has used #60 the Ativan since August which is around 10 to 15/month Depression screen PHQ 2/9 09/28/2019  Decreased Interest 2  Down, Depressed, Hopeless 2  PHQ - 2 Score 4  Altered sleeping 2  Tired, decreased energy 2  Change in appetite 0  Feeling bad or failure about yourself  1  Trouble concentrating 2  Moving slowly or fidgety/restless 0  Suicidal thoughts 0  PHQ-9 Score 11  Difficult doing work/chores Somewhat difficult  Some recent data might be hidden  A/P: Patient with continued significant grief now approximately 15 months out from loss of daughter also with significant stress related to this.  Ativan has been helpful for  stress.  Patient also compliant with Pristiq 100 mg and sertraline 100 mg-ideally would reduce dose but have wanted to move slowly on this with severity of symptoms.  We actually increased to Zoloft 100 mg visit before last due to very severe symptoms. -I she had been working with hospice counseling but stopped this related to Covid-would like to encourage her to restart at follow-up  Recommended follow up: Blood pressure recheck and blood work (ferritin, cbc, cmp) within a month   Lab/Order associations:   ICD-10-CM   1. Moderate episode of recurrent major depressive disorder (HCC)  F33.1   2. Essential hypertension  I10   3. Restless legs  G25.81     Meds ordered this encounter  Medications  . LORazepam (ATIVAN) 0.5 MG tablet    Sig: TAKE 1 TABLET BY MOUTH TWICE DAILY AS NEEDED FOR ANXIETY. DON'T DRIVE 8 HOURS AFTER USING    Dispense:  60 tablet    Refill:  0  . amLODipine (NORVASC) 5 MG tablet    Sig: Take 1 tablet (5 mg total) by mouth daily.    Dispense:  90 tablet    Refill:  3   Return precautions advised.  Garret Reddish, MD

## 2019-09-28 NOTE — Patient Instructions (Signed)
Health Maintenance Due  Topic Date Due  . TETANUS/TDAP  08/30/2019   Depression screen [facility] 2/9 08/25/2019 10/30/2018 04/04/2018  Decreased Interest 1 2 2   Down, Depressed, Hopeless 2 1 2   PHQ - 2 Score 3 3 4   Altered sleeping 3 2 3   Tired, decreased energy 3 2 2   Change in appetite 3 3 2   Feeling bad or failure about yourself  1 2 2   Trouble concentrating 0 2 3  Moving slowly or fidgety/restless 0 2 3  Suicidal thoughts 0 2 2  PHQ-9 Score 13 18 21   Difficult doing work/chores - Somewhat difficult Very difficult  Some recent data might be hidden

## 2019-09-28 NOTE — Assessment & Plan Note (Signed)
S: compliant with  amlodipine 2.5 mg and propranolol 20 mg BID BP Readings from Last 3 Encounters:  09/28/19 (!) 148/85  08/25/19 140/90  06/17/19 134/78  A/P: Patient with poor control on home checks despite addition of amlodipine the diastolic has improved.  We previously stopped hydrochlorothiazide due to hyponatremia.  Plan was for repeat today BMP but was scheduled as a virtual visit.  Due to continued poor control we will increase amlodipine to 5 mg and continue propranolol-advised to follow-up within a month for recheck

## 2019-09-28 NOTE — Assessment & Plan Note (Signed)
S:ferritin level 16 last visit- she is now on iron twice a week. Mild improvement in symptoms on the iron.  Restarted gabapentin 300mg - not sure if it has helped much and causes some flushing in cheeks-minimally evident on camera. A/P: I am not sure why patient does not think the gabapentin is what improved her symptoms-she does not feel strongly about increasing the dose for now so we will continue though I offered stopping to see if symptoms worsen.  Patient plans to continue iron and we can check ferritin levels at follow-up though discussed with only short interval may not have drastically improved-discussed long-term goal of 75

## 2019-10-06 ENCOUNTER — Telehealth: Payer: Self-pay

## 2019-10-06 NOTE — Telephone Encounter (Signed)
You can schedule her for the week of 02/22 or after for BP and lab f/u. Schedule the lab visit 2 days prior to the follow up OV please.

## 2019-10-06 NOTE — Telephone Encounter (Signed)
Patient want to know can she come in  Have  blood work done then make another appt for blood pressure. Pt states that she needs to be scheduled for blood pressure and blood work follow up

## 2019-10-09 ENCOUNTER — Other Ambulatory Visit: Payer: Self-pay | Admitting: Family Medicine

## 2019-10-29 ENCOUNTER — Other Ambulatory Visit: Payer: Self-pay

## 2019-10-29 ENCOUNTER — Other Ambulatory Visit: Payer: Self-pay | Admitting: Family Medicine

## 2019-10-29 DIAGNOSIS — E871 Hypo-osmolality and hyponatremia: Secondary | ICD-10-CM

## 2019-11-02 ENCOUNTER — Other Ambulatory Visit: Payer: Self-pay

## 2019-11-02 ENCOUNTER — Other Ambulatory Visit (INDEPENDENT_AMBULATORY_CARE_PROVIDER_SITE_OTHER): Payer: PPO

## 2019-11-02 DIAGNOSIS — E871 Hypo-osmolality and hyponatremia: Secondary | ICD-10-CM | POA: Diagnosis not present

## 2019-11-02 LAB — COMPREHENSIVE METABOLIC PANEL
ALT: 16 U/L (ref 0–35)
AST: 18 U/L (ref 0–37)
Albumin: 4.1 g/dL (ref 3.5–5.2)
Alkaline Phosphatase: 57 U/L (ref 39–117)
BUN: 16 mg/dL (ref 6–23)
CO2: 30 mEq/L (ref 19–32)
Calcium: 9.4 mg/dL (ref 8.4–10.5)
Chloride: 97 mEq/L (ref 96–112)
Creatinine, Ser: 0.67 mg/dL (ref 0.40–1.20)
GFR: 86.87 mL/min (ref >60.00)
Glucose, Bld: 95 mg/dL (ref 70–99)
Potassium: 4.1 mEq/L (ref 3.5–5.1)
Sodium: 135 mEq/L (ref 135–145)
Total Bilirubin: 0.4 mg/dL (ref 0.2–1.2)
Total Protein: 6.3 g/dL (ref 6.0–8.3)

## 2019-11-02 NOTE — Progress Notes (Signed)
Sodium and chloride levels have completely normalized-good news.  Blood sugar was also normal range.  Liver was normal

## 2019-11-03 ENCOUNTER — Other Ambulatory Visit: Payer: Self-pay

## 2019-11-03 MED ORDER — AMLODIPINE BESYLATE 5 MG PO TABS: 5.0000 mg | ORAL_TABLET | Freq: Every day | ORAL | 3 refills | Status: DC

## 2019-11-04 ENCOUNTER — Other Ambulatory Visit: Payer: Self-pay

## 2019-11-04 DIAGNOSIS — D37030 Neoplasm of uncertain behavior of the parotid salivary glands: Secondary | ICD-10-CM | POA: Diagnosis not present

## 2019-11-05 ENCOUNTER — Ambulatory Visit: Payer: PPO | Admitting: Family Medicine

## 2019-11-05 NOTE — Progress Notes (Deleted)
Phone [phone] In person visit   Subjective:   Katherine Foley is a 69 y.o. year old very pleasant female patient who presents for/with See problem oriented charting No chief complaint on file.   This visit occurred during the SARS-CoV-2 public health emergency.  Safety protocols were in place, including screening questions prior to the visit, additional usage of staff PPE, and extensive cleaning of exam room while observing appropriate contact time as indicated for disinfecting solutions.   Past Medical History-  Patient Active Problem List   Diagnosis Date Noted  . Restless legs 08/25/2019  . H/O parotidectomy 04/28/2019  . Genetic testing 02/10/2018  . Family history of breast cancer   . 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 Hot flashes 03/22/2016  . Allergic rhinitis 09/22/2015  . Depression 09/22/2015  . Former smoker 09/22/2015  . Adenomatous colon polyp   . Diastolic dysfunction 123456  . GERD (gastroesophageal reflux disease) 06/04/2014  . S/P AVR (aortic valve replacement) 11/06/2013  . Aortic stenosis 07/07/2010  . CAD (coronary artery disease) 05/17/2010  . Carotid stenosis 05/17/2010  . Herpes simplex virus (HSV) infection 01/16/2010  . HLD (hyperlipidemia) 08/29/2009  . Essential hypertension 04/14/2007  . Osteopenia 04/14/2007    Medications- reviewed and updated Current Outpatient Medications  Medication Sig Dispense Refill  . Albuterol Sulfate (PROAIR RESPICLICK) [id number] (90 Base) MCG/ACT AEPB Inhale 1 puff into the lungs every 6 (six) hours as needed (wheezing or shortness of breath). 1 each 3  . amLODipine (NORVASC) 5 MG tablet Take 1 tablet (5 mg total) by mouth daily. 90 tablet 3  . aspirin EC 81 MG tablet Take 1 tablet (81 mg total) by mouth daily.    . cetirizine (ZYRTEC) 10 MG tablet Take 10 mg by mouth daily as needed for allergies.     Marland Kitchen desvenlafaxine (PRISTIQ) 100 MG 24 hr tablet Take 1 tablet (100 mg total) by mouth daily. 90 tablet 3    . DEXILANT 60 MG capsule Take 1 capsule by mouth once daily 90 capsule 1  . fluticasone (FLONASE) 50 MCG/ACT nasal spray Use 2 spray(s) in each nostril once daily 16 g 0  . gabapentin (NEURONTIN) 300 MG capsule Take 1 capsule (300 mg total) by mouth at bedtime. 30 capsule 5  . LORazepam (ATIVAN) 0.5 MG tablet TAKE 1 TABLET BY MOUTH TWICE DAILY AS NEEDED FOR ANXIETY. DON'T DRIVE 8 HOURS AFTER USING 60 tablet 0  . Multiple Vitamins-Minerals (MULTIVITAMIN ADULT PO) Take 1 tablet by mouth daily.     . nitroGLYCERIN (NITROSTAT) 0.4 MG SL tablet Place 0.4 mg under the tongue every 5 (five) minutes as needed for chest pain.    Marland Kitchen propranolol (INDERAL) 20 MG tablet Take 1 tablet by mouth twice daily 180 tablet 0  . rosuvastatin (CRESTOR) 20 MG tablet Take 1 tablet by mouth once daily 30 tablet 0  . sertraline (ZOLOFT) 100 MG tablet Take 1 tablet (100 mg total) by mouth daily. 30 tablet 5  . SPIRIVA HANDIHALER 18 MCG inhalation capsule Inhale 1 puff by mouth once daily 90 capsule 0  . SYMBICORT 160-4.5 MCG/ACT inhaler Inhale 2 puffs by mouth twice daily 11 g 2   No current facility-administered medications for this visit.     Objective:  There were no vitals taken for this visit. Gen: NAD, resting comfortably CV: RRR no murmurs rubs or gallops Lungs: CTAB no crackles, wheeze, rhonchi Abdomen: soft/nontender/nondistended/normal bowel sounds. No rebound or guarding.  Ext: no edema Skin: warm, dry  Neuro: grossly normal, moves all extremities  ***    Assessment and Plan  # Hypertension  S:compliant with  amlodipine 5 mg and propranolol 20 mg BID A/P: ***   # Restless legs  S:Has had some improvement with Iron twice a week. Currently on  Gabapentin 300mg  not sure if has had any improvement in symptoms at this dose and not willing to increase.  A/P: ***   # Depression/ Grieving  S:Patient continues to grieve the loss of daughter [name] who was also her best friend. She has been taking Ativan.  Using only about 10-15 a month. She is also taking Pristiq 100 mg and sertraline 100 mg. Had received counseling in the past with hospice but has stopped due to covid.  A/P: ***        ***red on cheeks- malar?  Mention January 25  ***Recommended follow up: Blood pressure recheck and blood work (ferritin, cbc, cmp) within a month  No problem-specific Assessment & Plan notes found for this encounter.   Recommended follow up: ***No follow-ups on file. Future Appointments  Date Time Provider [place]  11/05/2019  4:20 PM Marin Olp, MD [facility]    Lab/Order associations: No diagnosis found.  No orders of the defined types were placed in this encounter.   Time Spent: *** minutes of total time (4:06 PM***- 4:06 PM***) was spent on the date of the encounter performing the following actions: chart review prior to seeing the patient, obtaining history, performing a medically necessary exam, counseling on the treatment plan, placing orders, and documenting in our EHR.   Return precautions advised.  Francella Solian, CMA

## 2019-11-09 ENCOUNTER — Other Ambulatory Visit: Payer: Self-pay

## 2019-11-09 ENCOUNTER — Encounter: Payer: Self-pay | Admitting: Family Medicine

## 2019-11-09 ENCOUNTER — Ambulatory Visit (INDEPENDENT_AMBULATORY_CARE_PROVIDER_SITE_OTHER): Payer: PPO | Admitting: Family Medicine

## 2019-11-09 VITALS — BP 120/72 | HR 55 | Temp 98.2°F | Ht 62.5 in | Wt 142.0 lb

## 2019-11-09 DIAGNOSIS — G2581 Restless legs syndrome: Secondary | ICD-10-CM | POA: Diagnosis not present

## 2019-11-09 DIAGNOSIS — I10 Essential (primary) hypertension: Secondary | ICD-10-CM | POA: Diagnosis not present

## 2019-11-09 DIAGNOSIS — I2510 Atherosclerotic heart disease of native coronary artery without angina pectoris: Secondary | ICD-10-CM

## 2019-11-09 DIAGNOSIS — F3341 Major depressive disorder, recurrent, in partial remission: Secondary | ICD-10-CM | POA: Diagnosis not present

## 2019-11-09 LAB — CBC WITH DIFFERENTIAL/PLATELET
Basophils Absolute: 0 10*3/uL (ref 0.0–0.1)
Basophils Relative: 0.4 % (ref 0.0–3.0)
Eosinophils Absolute: 0.1 10*3/uL (ref 0.0–0.7)
Eosinophils Relative: 0.9 % (ref 0.0–5.0)
HCT: 39.1 % (ref 36.0–46.0)
Hemoglobin: 12.8 g/dL (ref 12.0–15.0)
Lymphocytes Relative: 17 % (ref 12.0–46.0)
Lymphs Abs: 1 10*3/uL (ref 0.7–4.0)
MCHC: 32.9 g/dL (ref 30.0–36.0)
MCV: 89.4 fl (ref 78.0–100.0)
Monocytes Absolute: 0.7 10*3/uL (ref 0.1–1.0)
Monocytes Relative: 12 % (ref 3.0–12.0)
Neutro Abs: 4.1 10*3/uL (ref 1.4–7.7)
Neutrophils Relative %: 69.7 % (ref 43.0–77.0)
Platelets: 193 10*3/uL (ref 150.0–400.0)
RBC: 4.37 Mil/uL (ref 3.87–5.11)
RDW: 16.8 % — ABNORMAL HIGH (ref 11.5–15.5)
WBC: 6 10*3/uL (ref 4.0–10.5)

## 2019-11-09 LAB — COMPREHENSIVE METABOLIC PANEL
ALT: 15 U/L (ref 0–35)
AST: 18 U/L (ref 0–37)
Albumin: 4.1 g/dL (ref 3.5–5.2)
Alkaline Phosphatase: 57 U/L (ref 39–117)
BUN: 15 mg/dL (ref 6–23)
CO2: 30 mEq/L (ref 19–32)
Calcium: 9.4 mg/dL (ref 8.4–10.5)
Chloride: 95 mEq/L — ABNORMAL LOW (ref 96–112)
Creatinine, Ser: 0.62 mg/dL (ref 0.40–1.20)
GFR: 95 mL/min (ref >60.00)
Glucose, Bld: 100 mg/dL — ABNORMAL HIGH (ref 70–99)
Potassium: 4.6 mEq/L (ref 3.5–5.1)
Sodium: 132 mEq/L — ABNORMAL LOW (ref 135–145)
Total Bilirubin: 0.4 mg/dL (ref 0.2–1.2)
Total Protein: 6.2 g/dL (ref 6.0–8.3)

## 2019-11-09 LAB — IBC + FERRITIN
Ferritin: 34.1 ng/mL (ref 10.0–291.0)
Iron: 62 ug/dL (ref 42–145)
Saturation Ratios: 16.9 % — ABNORMAL LOW (ref 20.0–50.0)
Transferrin: 262 mg/dL (ref 212.0–360.0)

## 2019-11-09 MED ORDER — ROSUVASTATIN CALCIUM 20 MG PO TABS: 20.0000 mg | ORAL_TABLET | Freq: Every day | ORAL | 5 refills | Status: DC

## 2019-11-09 MED ORDER — NITROGLYCERIN 0.4 MG SL SUBL: 0.4000 mg | SUBLINGUAL_TABLET | SUBLINGUAL | 5 refills | Status: DC | PRN

## 2019-11-09 MED ORDER — SERTRALINE HCL 50 MG PO TABS: 50.0000 mg | ORAL_TABLET | Freq: Every day | ORAL | 3 refills | Status: DC

## 2019-11-09 NOTE — Assessment & Plan Note (Signed)
S: Ferritin number was at 16 last visit.  She has been compliant with iron twice a week.  She has noted  improvement in symptoms- happening only rarely now.  Also on gabapentin 300 mg. A/P: hopefully ferritin above 75- likely continue iron twice a week. Continue gabapentin- appears to be in much better spot

## 2019-11-09 NOTE — Patient Instructions (Addendum)
Health Maintenance Due  Topic Date Due  . TETANUS/TDAP - please consider getting this updated at your pharmacy- needs to be at least 2 weeks after covid shot 08/30/2019   No changes today- glad things are improving for you   Recommended follow up: Return in about 4-6 months (around 03/10/2020) for physical or sooner if needed.

## 2019-11-09 NOTE — Assessment & Plan Note (Addendum)
S: Improving control with PHQ-9 down to 8 from 11.  She continues to grieve from the loss of daughter [name] who was her best friend-lost her October 2019.  Patient is on Pristiq 100 mg and sertraline 50mg  (decreased from 100mg )  Previously saw hospice grief counseling and found this helpful  Looking forward to getting back to church in next few weeks- has been a year.  Depression screen PHQ 2/9 11/09/2019  Decreased Interest 1  Down, Depressed, Hopeless 1  PHQ - 2 Score 2  Altered sleeping 3  Tired, decreased energy 1  Change in appetite 0  Feeling bad or failure about yourself  1  Trouble concentrating 1  Moving slowly or fidgety/restless 0  Suicidal thoughts 0  PHQ-9 Score 8  Difficult doing work/chores Somewhat difficult  Some recent data might be hidden  A/P:  Much improved control despite decreasing sertraline. I am honestly thrilled by her level of progress. Hoping with pandemic getting better that things will continue to improve- continue current medicines. Would consider partial remission.

## 2019-11-09 NOTE — Progress Notes (Signed)
Phone [phone] In person visit   Subjective:   Katherine Foley is a 69 y.o. year old very pleasant female patient who presents for/with See problem oriented charting Chief Complaint  Patient presents with  . Hypertension    This visit occurred during the SARS-CoV-2 public health emergency.  Safety protocols were in place, including screening questions prior to the visit, additional usage of staff PPE, and extensive cleaning of exam room while observing appropriate contact time as indicated for disinfecting solutions.   Past Medical History-  Patient Active Problem List   Diagnosis Date Noted  . H/O parotidectomy 04/28/2019    Priority: High  . 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 Depression 09/22/2015    Priority: High  . Former smoker 09/22/2015    Priority: High  . S/P AVR (aortic valve replacement) 11/06/2013    Priority: High  . Aortic stenosis 07/07/2010    Priority: High  . CAD (coronary artery disease) 05/17/2010    Priority: High  . Restless legs 08/25/2019    Priority: Medium  . Hot flashes 03/22/2016    Priority: Medium  . Diastolic dysfunction [id number]    Priority: Medium  . GERD (gastroesophageal reflux disease) 06/04/2014    Priority: Medium  . Carotid stenosis 05/17/2010    Priority: Medium  . HLD (hyperlipidemia) 08/29/2009    Priority: Medium  . Essential hypertension 04/14/2007    Priority: Medium  . Osteopenia 04/14/2007    Priority: Medium  . Genetic testing 02/10/2018    Priority: Low  . Family history of breast cancer     Priority: Low  . Allergic rhinitis 09/22/2015    Priority: Low  . Adenomatous colon polyp     Priority: Low  . Herpes simplex virus (HSV) infection 01/16/2010    Priority: Low    Medications- reviewed and updated Current Outpatient Medications  Medication Sig Dispense Refill  . Albuterol Sulfate (PROAIR RESPICLICK) [id number] (90 Base) MCG/ACT AEPB Inhale 1 puff into the lungs every 6 (six)  hours as needed (wheezing or shortness of breath). 1 each 3  . amLODipine (NORVASC) 5 MG tablet Take 1 tablet (5 mg total) by mouth daily. 90 tablet 3  . aspirin EC 81 MG tablet Take 1 tablet (81 mg total) by mouth daily.    . cetirizine (ZYRTEC) 10 MG tablet Take 10 mg by mouth daily as needed for allergies.     Marland Kitchen desvenlafaxine (PRISTIQ) 100 MG 24 hr tablet Take 1 tablet (100 mg total) by mouth daily. 90 tablet 3  . DEXILANT 60 MG capsule Take 1 capsule by mouth once daily 90 capsule 1  . fluticasone (FLONASE) 50 MCG/ACT nasal spray Use 2 spray(s) in each nostril once daily 16 g 0  . gabapentin (NEURONTIN) 300 MG capsule Take 1 capsule (300 mg total) by mouth at bedtime. 30 capsule 5  . LORazepam (ATIVAN) 0.5 MG tablet TAKE 1 TABLET BY MOUTH TWICE DAILY AS NEEDED FOR ANXIETY. DON'T DRIVE 8 HOURS AFTER USING 60 tablet 0  . Multiple Vitamins-Minerals (MULTIVITAMIN ADULT PO) Take 1 tablet by mouth daily.     . nitroGLYCERIN (NITROSTAT) 0.4 MG SL tablet Place 1 tablet (0.4 mg total) under the tongue every 5 (five) minutes as needed for chest pain (if pain does not resolve with 2 doses, call 911). 30 tablet 5  . propranolol (INDERAL) 20 MG tablet Take 1 tablet by mouth twice daily 180 tablet 0  . rosuvastatin (CRESTOR) 20 MG tablet  Take 1 tablet (20 mg total) by mouth daily. 30 tablet 5  . sertraline (ZOLOFT) 50 MG tablet Take 1 tablet (50 mg total) by mouth daily. 90 tablet 3  . SPIRIVA HANDIHALER 18 MCG inhalation capsule Inhale 1 puff by mouth once daily 90 capsule 0  . SYMBICORT 160-4.5 MCG/ACT inhaler Inhale 2 puffs by mouth twice daily 11 g 2   No current facility-administered medications for this visit.     Objective:  BP 120/72   Pulse (!) 55   Temp 98.2 F (36.8 C)   Ht 5' 2.5" (1.588 m)   Wt 142 lb (64.4 kg)   SpO2 98%   BMI 25.56 kg/m  Gen: NAD, resting comfortably CV: slightly bradycardic, no murmurs rubs or gallops Lungs: CTAB no crackles, wheeze, rhonchi Abdomen:  soft/nontender/nondistended/normal bowel sounds.  Ext: no edema Skin: warm, dry     Assessment and Plan   #hypertension S: compliant with Amlodipine 5mg  (up from 2.5 mg) and propanolol 20mg  BID , pt states BP has been running good whenever she checks it. Pt c/o HA in maxillary sinuses (some green discharg) but states she thinks its sinus, denies blurred vision and chest discomfort. -Hyponatremia on hydrochlorothiazide in the past. Last visit had hyponatreamia- that has resolved completely off hctz.   BP Readings from Last 3 Encounters:  11/09/19 120/72  09/28/19 (!) 148/85  08/25/19 140/90  A/P: Stable. Continue current medications.   # Restless legs S: Ferritin number was at 16 last visit.  She has been compliant with iron twice a week.  She has noted  improvement in symptoms- happening only rarely now.  Also on gabapentin 300 mg. A/P: hopefully ferritin above 75 (will check next visit)- likely continue iron twice a week. Continue gabapentin- appears to be in much better spot  - some flushing in cheeks on gabapentin- not worsening  # Depression S: Improving control with PHQ-9 down to 8 from 11.  She continues to grieve from the loss of daughter [name] who was her best friend-lost her October 2019.  Patient is on Pristiq 100 mg and sertraline 50mg  (decreased from 100mg )  Previously saw hospice grief counseling and found this helpful  Looking forward to getting back to church in next few weeks- has been a year.  Depression screen PHQ 2/9 11/09/2019  Decreased Interest 1  Down, Depressed, Hopeless 1  PHQ - 2 Score 2  Altered sleeping 3  Tired, decreased energy 1  Change in appetite 0  Feeling bad or failure about yourself  1  Trouble concentrating 1  Moving slowly or fidgety/restless 0  Suicidal thoughts 0  PHQ-9 Score 8  Difficult doing work/chores Somewhat difficult  Some recent data might be hidden  A/P:  Much improved control despite decreasing sertraline. I am honestly  thrilled by her level of progress. Hoping with pandemic getting better that things will continue to improve- continue current medicines  #hyperlipidemia/ CAD S: compliant with rosuvastatin 20mg  daily  CAD- states occasional mild chest pain- has not had to take nitroglyercin- states likely expired. Also takes her aspirin Lab Results  Component Value Date   CHOL 163 07/01/2019   HDL 70 07/01/2019   LDLCALC 68 07/01/2019   LDLDIRECT 65.0 06/19/2017   TRIG 146 07/01/2019   CHOLHDL 2.3 07/01/2019   A/P: CAD appears stable. Continue asa and statin   #Overweight- walking 2 miles a day! She is on octavia diet and has found that helpful. Congratulated on progress- would focus on maintenance at this  point   #eczema- saw dermatology recently for area on neck- not getting much better- encouraged follow up  Recommended follow up: Return in about 4-6 months (around 03/10/2020) for physical or sooner if needed.  Lab/Order associations:   ICD-10-CM   1. Coronary artery disease involving native coronary artery of native heart without angina pectoris  I25.10   2. Essential hypertension  I10 CBC with Differential/Platelet    Comprehensive metabolic panel  3. Restless legs  G25.81 CBC with Differential/Platelet    IBC + Ferritin  4. Recurrent major depressive disorder, in partial remission ([place])  F33.41     Meds ordered this encounter  Medications  . rosuvastatin (CRESTOR) 20 MG tablet    Sig: Take 1 tablet (20 mg total) by mouth daily.    Dispense:  30 tablet    Refill:  5  . sertraline (ZOLOFT) 50 MG tablet    Sig: Take 1 tablet (50 mg total) by mouth daily.    Dispense:  90 tablet    Refill:  3  . nitroGLYCERIN (NITROSTAT) 0.4 MG SL tablet    Sig: Place 1 tablet (0.4 mg total) under the tongue every 5 (five) minutes as needed for chest pain (if pain does not resolve with 2 doses, call 911).    Dispense:  30 tablet    Refill:  5   Return precautions advised.  Garret Reddish, MD

## 2019-11-09 NOTE — Assessment & Plan Note (Addendum)
S: compliant with Amlodipine 5mg  (up from 2.5 mg) and propanolol 20mg  BID , pt states BP has been running good whenever she checks it. Pt c/o HA in maxillary sinuses (some green discharg) but states she thinks its sinus, denies blurred vision and chest discomfort. -Hyponatremia on hydrochlorothiazide in the past BP Readings from Last 3 Encounters:  11/09/19 120/72  09/28/19 (!) 148/85  08/25/19 140/90  A/P: Stable. Continue current medications.

## 2019-11-30 ENCOUNTER — Other Ambulatory Visit: Payer: Self-pay | Admitting: Family Medicine

## 2019-12-01 ENCOUNTER — Other Ambulatory Visit: Payer: Self-pay | Admitting: Family Medicine

## 2019-12-07 ENCOUNTER — Other Ambulatory Visit: Payer: Self-pay | Admitting: Family Medicine

## 2019-12-14 ENCOUNTER — Other Ambulatory Visit: Payer: Self-pay | Admitting: Family Medicine

## 2019-12-31 ENCOUNTER — Encounter: Payer: Self-pay | Admitting: Acute Care

## 2020-02-17 ENCOUNTER — Ambulatory Visit: Payer: PPO | Attending: Internal Medicine

## 2020-02-17 ENCOUNTER — Telehealth (INDEPENDENT_AMBULATORY_CARE_PROVIDER_SITE_OTHER): Payer: PPO | Admitting: Family Medicine

## 2020-02-17 ENCOUNTER — Encounter: Payer: Self-pay | Admitting: Family Medicine

## 2020-02-17 VITALS — Ht 62.5 in | Wt 127.0 lb

## 2020-02-17 DIAGNOSIS — J441 Chronic obstructive pulmonary disease with (acute) exacerbation: Secondary | ICD-10-CM | POA: Diagnosis not present

## 2020-02-17 DIAGNOSIS — J329 Chronic sinusitis, unspecified: Secondary | ICD-10-CM

## 2020-02-17 DIAGNOSIS — B9689 Other specified bacterial agents as the cause of diseases classified elsewhere: Secondary | ICD-10-CM

## 2020-02-17 DIAGNOSIS — Z20822 Contact with and (suspected) exposure to covid-19: Secondary | ICD-10-CM | POA: Diagnosis not present

## 2020-02-17 MED ORDER — AMOXICILLIN-POT CLAVULANATE 875-125 MG PO TABS: 1.0000 | ORAL_TABLET | Freq: Two times a day (BID) | ORAL | 0 refills | Status: AC

## 2020-02-17 MED ORDER — PREDNISONE 20 MG PO TABS: ORAL_TABLET | ORAL | 0 refills | Status: DC

## 2020-02-17 MED ORDER — FLUTICASONE PROPIONATE 50 MCG/ACT NA SUSP: NASAL | 5 refills | Status: DC

## 2020-02-17 NOTE — Progress Notes (Signed)
Phone [phone] Virtual visit via Video note   Subjective:  Chief complaint: Chief Complaint  Patient presents with  . virtual  . sinus issues   This visit type was conducted due to national recommendations for restrictions regarding the COVID-19 Pandemic (e.g. social distancing).  This format is felt to be most appropriate for this patient at this time balancing risks to patient and risks to population by having him in for in person visit.  No physical exam was performed (except for noted visual exam or audio findings with Telehealth visits).    Our team/I connected with Katherine Foley at 10:00 AM EDT by a video enabled telemedicine application (doxy.me or caregility through epic) and verified that I am speaking with the correct person using two identifiers.  Location patient: [facility] Location provider: [facility], office Persons participating in the virtual visit:  patient  Our team/I discussed the limitations of evaluation and management by telemedicine and the availability of in person appointments. In light of current covid-19 pandemic, patient also understands that we are trying to protect them by minimizing in office contact if at all possible.  The patient expressed consent for telemedicine visit and agreed to proceed. Patient understands insurance will be billed.   Past Medical History-  Patient Active Problem List   Diagnosis Date Noted  . H/O parotidectomy 04/28/2019    Priority: High  . 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 Depression 09/22/2015    Priority: High  . Former smoker 09/22/2015    Priority: High  . S/P AVR (aortic valve replacement) 11/06/2013    Priority: High  . Aortic stenosis 07/07/2010    Priority: High  . CAD (coronary artery disease) 05/17/2010    Priority: High  . COPD (chronic obstructive pulmonary disease) ([place]) 07/23/2007    Priority: High  . Restless legs 08/25/2019    Priority: Medium  . Hot flashes 03/22/2016      Priority: Medium  . Diastolic dysfunction 12/45/8099    Priority: Medium  . GERD (gastroesophageal reflux disease) 06/04/2014    Priority: Medium  . Carotid stenosis 05/17/2010    Priority: Medium  . HLD (hyperlipidemia) 08/29/2009    Priority: Medium  . Essential hypertension 04/14/2007    Priority: Medium  . Osteopenia 04/14/2007    Priority: Medium  . Genetic testing 02/10/2018    Priority: Low  . Family history of breast cancer     Priority: Low  . Allergic rhinitis 09/22/2015    Priority: Low  . Adenomatous colon polyp     Priority: Low  . Herpes simplex virus (HSV) infection 01/16/2010    Priority: Low    Medications- reviewed and updated Current Outpatient Medications  Medication Sig Dispense Refill  . Albuterol Sulfate (PROAIR RESPICLICK) 833 (90 Base) MCG/ACT AEPB Inhale 1 puff into the lungs every 6 (six) hours as needed (wheezing or shortness of breath). 1 each 3  . amLODipine (NORVASC) 5 MG tablet Take 1 tablet (5 mg total) by mouth daily. 90 tablet 3  . aspirin EC 81 MG tablet Take 1 tablet (81 mg total) by mouth daily.    . cetirizine (ZYRTEC) 10 MG tablet Take 10 mg by mouth daily as needed for allergies.     Marland Kitchen desvenlafaxine (PRISTIQ) 100 MG 24 hr tablet Take 1 tablet by mouth once daily 90 tablet 0  . DEXILANT 60 MG capsule Take 1 capsule by mouth once daily 90 capsule 0  . fluticasone (FLONASE) 50 MCG/ACT  nasal spray Use 2 spray(s) in each nostril once daily 16 g 5  . gabapentin (NEURONTIN) 300 MG capsule Take 1 capsule (300 mg total) by mouth at bedtime. 30 capsule 5  . LORazepam (ATIVAN) 0.5 MG tablet TAKE 1 TABLET BY MOUTH TWICE DAILY AS NEEDED FOR ANXIETY. DON'T DRIVE 8 HOURS AFTER USING 60 tablet 0  . Multiple Vitamins-Minerals (MULTIVITAMIN ADULT PO) Take 1 tablet by mouth daily.     . nitroGLYCERIN (NITROSTAT) 0.4 MG SL tablet Place 1 tablet (0.4 mg total) under the tongue every 5 (five) minutes as needed for chest pain (if pain does not resolve with  2 doses, call 911). 30 tablet 5  . propranolol (INDERAL) 20 MG tablet Take 1 tablet by mouth twice daily 180 tablet 0  . rosuvastatin (CRESTOR) 20 MG tablet Take 1 tablet (20 mg total) by mouth daily. 30 tablet 5  . sertraline (ZOLOFT) 50 MG tablet Take 1 tablet (50 mg total) by mouth daily. 90 tablet 3  . SPIRIVA HANDIHALER 18 MCG inhalation capsule Inhale 1 puff by mouth once daily 90 capsule 0  . SYMBICORT 160-4.5 MCG/ACT inhaler Inhale 2 puffs by mouth twice daily 11 g 0  . amoxicillin-clavulanate (AUGMENTIN) 875-125 MG tablet Take 1 tablet by mouth 2 (two) times daily for 7 days. 14 tablet 0  . predniSONE (DELTASONE) 20 MG tablet Take 2 pills for 3 days, 1 pill for 4 days 10 tablet 0   No current facility-administered medications for this visit.     Objective:  Ht 5' 2.5" (1.588 m)   Wt 127 lb (57.6 kg)   BMI 22.86 kg/m  self reported vitals Gen: NAD, appears fatigued, intermittent cough Lungs: nonlabored, normal respiratory rate.  No obvious respiratory distress during visit Skin: appears dry, no obvious rash    Assessment and Plan   Sinus Issues/COPD exacerbation S: pt c/o sinus issues for 2 weeks. Pt c/o nasal and chest congestion and coughing up yellow brown mucous. Sinus discharge as well.  Pt denies fever and she states her taste is kind of funny.Pt has used flonase.  She is fully vaccinated from covid 19.  Pt has not gotten covid tested.   She has had some increased wheeze and shotness of breath- has only used albuterol once. She is on symbicort  But only once a dayand spiriva as well. Cough ahs increased.  A/P: 69 year old female with COPD with increased sputum production/wheeze/shortness of breath/cough.  Suspect COPD exacerbation.  Also has sinus tenderness and congestion so likely bacterial sinusitis as well. -Cover for potential bacterial infection with Augmentin -Start course of prednisone - asked her to increase symbicort to twice a day.  We also discussed  importance of maintaining this afterwards to help prevent flareups/exacerbation -Recommended follow-up next week if not improving and have negative Covid test -Seek care sooner with [place] during the week or perhaps urgent care or emergency room over the weekend if symptoms worsen at that time -Patient with symptoms concerning for potential covid 19 but she is fully vaccinated.  Therefore: -information provided on testing scheduling "please text "COVID" to [phone], OR you can log on to [email or web address] to easily make an on-line appointment. "   - recommended patient watch closely for worsening shortness of breath or confusion or worsening symptoms and if those occur he should contact us immediately  Or seek care -recommended self isolation until negative test  at minimum.   Recommended follow up: As follow-up in July Future Appointments  Date Time Provider  [place]  02/17/2020 10:00 AM Marin Olp, MD [facility]  03/09/2020 10:40 AM Yong Channel Brayton Mars, MD [facility]    Lab/Order associations:   ICD-10-CM   1. COPD exacerbation ([place])  J44.1   2. Bacterial sinusitis  J32.9    B96.89     Meds ordered this encounter  Medications  . predniSONE (DELTASONE) 20 MG tablet    Sig: Take 2 pills for 3 days, 1 pill for 4 days    Dispense:  10 tablet    Refill:  0  . amoxicillin-clavulanate (AUGMENTIN) 875-125 MG tablet    Sig: Take 1 tablet by mouth 2 (two) times daily for 7 days.    Dispense:  14 tablet    Refill:  0  . fluticasone (FLONASE) 50 MCG/ACT nasal spray    Sig: Use 2 spray(s) in each nostril once daily    Dispense:  16 g    Refill:  5      Return precautions advised.  Garret Reddish, MD

## 2020-02-17 NOTE — Patient Instructions (Addendum)
There are no preventive care reminders to display for this patient.

## 2020-02-18 ENCOUNTER — Other Ambulatory Visit: Payer: Self-pay | Admitting: Family Medicine

## 2020-02-18 LAB — NOVEL CORONAVIRUS, NAA: SARS-CoV-2, NAA: NOT DETECTED

## 2020-02-18 LAB — SARS-COV-2, NAA 2 DAY TAT

## 2020-02-22 ENCOUNTER — Other Ambulatory Visit: Payer: Self-pay | Admitting: Family Medicine

## 2020-02-22 ENCOUNTER — Telehealth: Payer: Self-pay | Admitting: Family Medicine

## 2020-02-22 ENCOUNTER — Other Ambulatory Visit: Payer: Self-pay

## 2020-02-22 MED ORDER — SPIRIVA HANDIHALER 18 MCG IN CAPS: ORAL_CAPSULE | RESPIRATORY_TRACT | 0 refills | Status: DC

## 2020-02-22 NOTE — Telephone Encounter (Signed)
Patient is calling in stating she was prescribed an antibiotic but now has a yeast infection and would like to know if someone could prescribe her something.

## 2020-02-22 NOTE — Telephone Encounter (Signed)
  LAST APPOINTMENT DATE: 02/17/2020  NEXT APPOINTMENT DATE:@Visit  date not found  MEDICATION:SYMBICORT 160-4.5 MCG/ACT inhaler // gabapentin (NEURONTIN) 300 MG capsule  [facility], [place] - [address]

## 2020-02-22 NOTE — Telephone Encounter (Signed)
Abx given by Dr Yong Channel. He will be out all week. Ok to call in diflucan or does she need to be swabbed for Yeast?

## 2020-02-23 ENCOUNTER — Other Ambulatory Visit: Payer: Self-pay

## 2020-02-23 MED ORDER — FLUCONAZOLE 150 MG PO TABS
150.0000 mg | ORAL_TABLET | Freq: Once | ORAL | 0 refills | Status: AC
Start: 2020-02-23 — End: 2020-02-23

## 2020-02-23 NOTE — Telephone Encounter (Signed)
Ok to call in diflucan x 1 tablet.

## 2020-02-23 NOTE — Telephone Encounter (Signed)
Meds called in my chart sent to patient to let know

## 2020-02-26 ENCOUNTER — Other Ambulatory Visit: Payer: Self-pay | Admitting: Family Medicine

## 2020-03-07 ENCOUNTER — Other Ambulatory Visit: Payer: Self-pay | Admitting: Family Medicine

## 2020-03-08 NOTE — Patient Instructions (Addendum)
Get your Tdap at your pharmacy pretty please- let us know when you get this  Schedule your follow up mammogram  [facility] hospice grief counseling- team will give her some info to reach out  We will call you within two weeks about your referral to ultrasound of carotids. If you do not hear within 3 weeks, give [place] a call.   Schedule your bone density test at check out desk. You may also call directly to X-ray at [phone] to schedule an appointment that is convenient for you.  - located [address] across the [address] from [place] - in the basement - you do need an appointment for the bone density tests.   Please stop by lab before you go If you have mychart- we will send your results within 3 business days of [place] receiving them.  If you do not have mychart- we will call you about results within 5 business days of [place] receiving them.   Please go to [facility] (updated 10/29/2019) - located [address] across the street from [place] - in the basement - Hours: 8:30-5:00 PM M-F (with lunch from 12:30- 1 PM). You do NOT need an appointment.  - Please ensure you are covid symptom free before going in(No fever, chills, cough, congestion, runny nose, shortness of breath, fatigue, body aches, sore throat, headache, nausea, vomiting, diarrhea, or new loss of taste or smell. No known contacts with covid 19 or someone being tested for covid 19)

## 2020-03-08 NOTE — Progress Notes (Signed)
Phone [phone]   Subjective:  Patient presents today for their annual physical. Chief complaint-noted.   See problem oriented charting- Review of Systems  Constitutional: Negative for chills and fever.  HENT: Negative for ear pain, hearing loss and tinnitus.   Eyes: Negative for blurred vision and double vision.  Respiratory: Positive for wheezing. Negative for cough and shortness of breath.        History of asthma   Cardiovascular: Positive for leg swelling. Negative for chest pain and palpitations.       Left foot swelling at end of day   Gastrointestinal: Negative for nausea and vomiting.  Genitourinary: Negative for dysuria, frequency and urgency.  Skin: Positive for rash.       Followed by dermatology   Neurological: Positive for headaches. Negative for dizziness, seizures and weakness.       Has app with ENT 7/8  Psychiatric/Behavioral: Negative for depression, memory loss and suicidal ideas. The patient does not have insomnia.     The following were reviewed and entered/updated in epic: Past Medical History:  Diagnosis Date  . Adenomatous colon polyp   . Anxiety   . ANXIETY DEPRESSION 02/15/2009  . AORTIC STENOSIS 07/07/2010   a. echo 12/23/10: EF 60-65%, mod to severe AS with mean gradient 29 mmHg;  b. 04/2011 s/p bioprosthetic AVR. // Echo 9/19:  Mild conc LVH, EF 60-65, no RWMA, Gr 1 DD, AVR ok (mean 10, peak 21), trivial MR, normal RVSF, mild TR   . Barrett's esophagus 04/14/2007  . Barrett's esophagus 2000  . CAD, NATIVE VESSEL 05/17/2010   a. NSTEMI 8/11 - BMS to RCA;  b. cath 4/12: dLAD 50%, RI 80% (PCI), AVCFX 30%, pRCA 30%, stent ok, 40-50, then 50% dist RCA;  c. s/p Promus DES to RI 12/25/10;  d. 04/2011 CABG x 1 (LIMA->LAD) @ time of AVR  . Carotid stenosis 05/17/2010   a. dopplers 57/26: RICA 20-35%; LICA 5-97% (follow up due 11/12)  . Chronic bronchitis ([place])    "multiple times" (03/25/2013)  . 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  Exertional shortness of breath    "related to COPD problems" (03/25/2013)  . Family history of breast cancer    2 daughters; PALB2 mutation in family  . Genetic testing 02/10/2018   PALB2 analysis at [facility] - Negative for familial mutation  . GERD (gastroesophageal reflux disease)   . H/O hiatal hernia   . Hemorrhoid thrombosis    "had it lanced" (03/25/2013)  . Herpes simplex without mention of complication 12/18/3843  . History of blood transfusion    'w/OHS" (03/25/2013)  . [place], MILD 08/29/2009  . HYPERTENSION 04/14/2007  . MIGRAINE HEADACHE 04/20/2008  . Myocardial infarction [facility]) 2011   "during catherization" (03/25/2013)  . OSTEOPENIA 04/14/2007  . Ovarian cyst   . Parotid mass    left  . Pneumonia    "multiple times" (03/25/2013)  . URINARY INCONTINENCE, STRESS, MILD 08/26/2007  . Urine, incontinence, stress female    Patient Active Problem List   Diagnosis Date Noted  . H/O parotidectomy 04/28/2019    Priority: High  . 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 Depression 09/22/2015    Priority: High  . Former smoker 09/22/2015    Priority: High  . S/P AVR (aortic valve replacement) 11/06/2013    Priority: High  . Aortic stenosis 07/07/2010    Priority: High  . CAD (coronary artery disease) 05/17/2010    Priority: High  .  COPD (chronic obstructive pulmonary disease) ([place]) 07/23/2007    Priority: High  . Restless legs 08/25/2019    Priority: Medium  . Hot flashes 03/22/2016    Priority: Medium  . Diastolic dysfunction 76/81/1572    Priority: Medium  . GERD (gastroesophageal reflux disease) 06/04/2014    Priority: Medium  . Carotid stenosis 05/17/2010    Priority: Medium  . HLD (hyperlipidemia) 08/29/2009    Priority: Medium  . Essential hypertension 04/14/2007    Priority: Medium  . Osteopenia 04/14/2007    Priority: Medium  . Genetic testing 02/10/2018    Priority: Low  . Family history of breast cancer     Priority: Low  .  Allergic rhinitis 09/22/2015    Priority: Low  . Adenomatous colon polyp     Priority: Low  . Herpes simplex virus (HSV) infection 01/16/2010    Priority: Low   Past Surgical History:  Procedure Laterality Date  . AORTIC VALVE REPLACEMENT  2012  . APPENDECTOMY    . BLEPHAROPLASTY Bilateral ~ 1998  . CARDIAC CATHETERIZATION N/A 07/06/2016   Procedure: Left Heart Cath and Cors/Grafts Angiography;  Surgeon: Sherren Mocha, MD;  Location: [place] [facility];  Service: Cardiovascular;  Laterality: N/A;  . CHOLECYSTECTOMY N/A 03/25/2013   Procedure: LAPAROSCOPIC CHOLECYSTECTOMY WITH INTRAOPERATIVE CHOLANGIOGRAM;  Surgeon: Imogene Burn. Georgette Dover, MD;  Location: [facility];  Service: General;  Laterality: N/A;  . CORONARY ANGIOGRAPHY N/A 05/26/2018   Procedure: CORONARY ANGIOGRAPHY (CATH LAB);  Surgeon: Jettie Booze, MD;  Location: [place] [facility];  Service: Cardiovascular;  Laterality: N/A;  . CORONARY ANGIOPLASTY WITH STENT PLACEMENT  2010   "I have 2 stents" (03/25/2013)  . NASAL SINUS SURGERY  1997; 1998   "cust in maxillary sinus; put a stent in then removed it" (03/25/2013)  . OVARIAN CYST REMOVAL Right 2011   "size of a soccer ball" (03/25/2013)  . PAROTIDECTOMY Left 04/28/2019   Procedure: LEFT TOTAL PAROTIDECTOMY;  Surgeon: Leta Baptist, MD;  Location: [facility];  Service: ENT;  Laterality: Left;  . thrombosed hemorrohid lanced    . TONSILLECTOMY AND ADENOIDECTOMY    . TOTAL ABDOMINAL HYSTERECTOMY      Family History  Problem Relation Age of Onset  . Coronary artery disease Other   . Heart disease Other        6 stents  . Heart disease Mother   . COPD Father        smoked  . Heart disease Father   . Rheum arthritis Father   . Heart disease Maternal Uncle   . Breast cancer Daughter        currently 68  . Asthma Son        as a child   . Breast cancer Daughter 16       currently 64; PALB2 mutation  . Cancer Other        very distant paternal cousin; pancreatic ca    . Cirrhosis Sister   . Stroke Brother   . Colon cancer Neg Hx   . Colon polyps Neg Hx     Medications- reviewed and updated Current Outpatient Medications  Medication Sig Dispense Refill  . Albuterol Sulfate (PROAIR RESPICLICK) 620 (90 Base) MCG/ACT AEPB Inhale 1 puff into the lungs every 6 (six) hours as needed (wheezing or shortness of breath). 1 each 3  . amLODipine (NORVASC) 5 MG tablet Take 1 tablet (5 mg total) by mouth daily. 90 tablet 3  . aspirin EC 81 MG  tablet Take 1 tablet (81 mg total) by mouth daily.    . COLLODION FLEXIBLE EX Apply topically.    Marland Kitchen desvenlafaxine (PRISTIQ) 100 MG 24 hr tablet Take 1 tablet by mouth once daily 90 tablet 0  . DEXILANT 60 MG capsule Take 1 capsule by mouth once daily 90 capsule 0  . gabapentin (NEURONTIN) 300 MG capsule Take 1 capsule by mouth once daily at bedtime 30 capsule 0  . LORazepam (ATIVAN) 0.5 MG tablet TAKE 1 TABLET BY MOUTH TWICE DAILY AS NEEDED FOR ANXIETY. DON'T DRIVE 8 HOURS AFTER USING 60 tablet 0  . Multiple Vitamins-Minerals (MULTIVITAMIN ADULT PO) Take 1 tablet by mouth daily.     . nitroGLYCERIN (NITROSTAT) 0.4 MG SL tablet Place 1 tablet (0.4 mg total) under the tongue every 5 (five) minutes as needed for chest pain (if pain does not resolve with 2 doses, call 911). 30 tablet 5  . propranolol (INDERAL) 20 MG tablet Take 1 tablet by mouth twice daily 180 tablet 0  . rosuvastatin (CRESTOR) 20 MG tablet Take 1 tablet (20 mg total) by mouth daily. 30 tablet 5  . sertraline (ZOLOFT) 50 MG tablet Take 1 tablet (50 mg total) by mouth daily. 90 tablet 3  . SPIRIVA HANDIHALER 18 MCG inhalation capsule 2 puffs by mouth bid 90 capsule 0  . SYMBICORT 160-4.5 MCG/ACT inhaler Inhale 2 puffs by mouth twice daily 11 g 0  . cetirizine (ZYRTEC) 10 MG tablet Take 10 mg by mouth daily as needed for allergies.  (Patient not taking: Reported on 03/09/2020)    . fluticasone (FLONASE) 50 MCG/ACT nasal spray Use 2 spray(s) in each nostril once daily  (Patient not taking: Reported on 03/09/2020) 16 g 5   No current facility-administered medications for this visit.    Allergies-reviewed and updated Allergies  Allergen Reactions  . Codeine Sulfate Itching and Rash  . Hydrocodone-Acetaminophen Itching and Rash    Social History   Social History Narrative   Prolonged engagement 17 years in 2017. 3 children. 6 grandkids. 2 greatgrandkids. Keeps greatgrandson on tuesdays- loves her so much.       Retired from [employer] (parttime- Dec 12)   Faith is very important to her      Hobbies: reading, cross stich, sew. Wants to learn to knit.       Objective  Objective:  BP 118/62   Pulse 63   Temp 98.2 F (36.8 C) (Temporal)   Ht 5' 2.5" (1.588 m)   Wt 129 lb (58.5 kg)   SpO2 94%   BMI 23.22 kg/m  Gen: NAD, resting comfortably HEENT: Mucous membranes are moist. Oropharynx normal Neck: no thyromegaly CV: RRR stable murmur Lungs: Faint wheeze and crackles at bilateral bases Abdomen: soft/nontender/nondistended/normal bowel sounds. No rebound or guarding.  Ext: no edema Skin: warm, dry Neuro: grossly normal, moves all extremities, PERRLA   Assessment and Plan   69 y.o. female presenting for annual physical.  Health Maintenance counseling: 1. Anticipatory guidance: Patient counseled regarding regular dental exams -has dentures , eye exams yearly,  avoiding smoking and second hand smoke , limiting alcohol to 1 beverage per day- Does not have more than one a day max- May only drink once a year    2. Risk factor reduction:  Advised patient of need for regular exercise and diet rich and fruits and vegetables to reduce risk of heart attack and stroke. Exercise-  walks 1-2 miles a day- outside of recent respiratory illness had been able to  do 2-3 miles Diet-  Has healthy diet - encouraged avoiding  Further weight loss. Doing small portsions every 2.5 hours.  Wt Readings from Last 3 Encounters:  03/09/20 129 lb (58.5 kg)  02/17/20 127 lb  (57.6 kg)  11/09/19 142 lb (64.4 kg)  3. Immunizations/screenings/ancillary studies- up to date other than Tdap Immunization History  Administered Date(s) Administered  . Influenza Split 06/19/2011, 07/02/2012  . Influenza Whole 08/03/2009  . Influenza, High Dose Seasonal PF 05/20/2015, 06/04/2019, 06/04/2019  . Influenza,inj,Quad PF,6+ Mos 05/21/2013, 05/14/2014, 06/17/2018  . Influenza-Unspecified 08/17/2016, 06/19/2017, 06/17/2018  . PFIZER SARS-COV-2 Vaccination 10/20/2019, 11/09/2019  . Pneumococcal Conjugate-13 06/14/2014  . Pneumococcal Polysaccharide-23 03/22/2016  . Td 08/29/2009  . Zoster 01/21/2016  . Zoster Recombinat (Shingrix) 03/10/2018, 07/15/2018  4. Cervical cancer screening- never had abnormal pap smear. Passed age based screening recommendations plus had complete hysterectomy 5. Breast cancer screening-  breast exam - prefers self exams- and mammogram July 2020 and due this year.  6. Colon cancer screening - 07/14/2014 with 10 year repeat planned.  Up to date  85. Skin cancer screening- saw dermatologist for rash but didn't do full skin exam- encouraged to consider full skin exam. advised regular sunscreen use. Denies worrisome, changing, or new skin lesions.  8. Birth control/STD check- not sexually active with fiancee- he is not able to be  9. Osteoporosis screening at 64-  last done 2017 , agrees to repeat  -former smoker quit 12 yrs ago. She now declines lung cancer screening program. Will get ua with labs   Status of chronic or acute concerns  See problem oriented charting/separate note  Recommended follow up: Return in about 6 months (around 09/09/2020) for physical or sooner if needed.  Lab/Order associations:not fasting   ICD-10-CM   1. Preventative health care  Z00.00 IBC + Ferritin    CBC with Differential/Platelet    Comprehensive metabolic panel    Lipid panel    DG Bone Density    Return precautions advised.  Garret Reddish, MD

## 2020-03-09 ENCOUNTER — Ambulatory Visit (INDEPENDENT_AMBULATORY_CARE_PROVIDER_SITE_OTHER)
Admission: RE | Admit: 2020-03-09 | Discharge: 2020-03-09 | Disposition: A | Discharge status: Home / Self Care | Payer: PPO | Source: Ambulatory Visit | Attending: Family Medicine | Admitting: Family Medicine

## 2020-03-09 ENCOUNTER — Ambulatory Visit (INDEPENDENT_AMBULATORY_CARE_PROVIDER_SITE_OTHER): Payer: PPO | Admitting: Family Medicine

## 2020-03-09 ENCOUNTER — Encounter: Payer: Self-pay | Admitting: Family Medicine

## 2020-03-09 ENCOUNTER — Other Ambulatory Visit: Payer: Self-pay

## 2020-03-09 VITALS — BP 118/62 | HR 63 | Temp 98.2°F | Ht 62.5 in | Wt 129.0 lb

## 2020-03-09 DIAGNOSIS — R05 Cough: Secondary | ICD-10-CM

## 2020-03-09 DIAGNOSIS — I350 Nonrheumatic aortic (valve) stenosis: Secondary | ICD-10-CM

## 2020-03-09 DIAGNOSIS — Z79899 Other long term (current) drug therapy: Secondary | ICD-10-CM | POA: Diagnosis not present

## 2020-03-09 DIAGNOSIS — Z0000 Encounter for general adult medical examination without abnormal findings: Secondary | ICD-10-CM

## 2020-03-09 DIAGNOSIS — F3341 Major depressive disorder, recurrent, in partial remission: Secondary | ICD-10-CM

## 2020-03-09 DIAGNOSIS — E785 Hyperlipidemia, unspecified: Secondary | ICD-10-CM

## 2020-03-09 DIAGNOSIS — R059 Cough, unspecified: Secondary | ICD-10-CM

## 2020-03-09 DIAGNOSIS — G2581 Restless legs syndrome: Secondary | ICD-10-CM | POA: Diagnosis not present

## 2020-03-09 DIAGNOSIS — D649 Anemia, unspecified: Secondary | ICD-10-CM | POA: Diagnosis not present

## 2020-03-09 DIAGNOSIS — I6529 Occlusion and stenosis of unspecified carotid artery: Secondary | ICD-10-CM

## 2020-03-09 DIAGNOSIS — Z87891 Personal history of nicotine dependence: Secondary | ICD-10-CM

## 2020-03-09 DIAGNOSIS — I10 Essential (primary) hypertension: Secondary | ICD-10-CM | POA: Diagnosis not present

## 2020-03-09 DIAGNOSIS — I2510 Atherosclerotic heart disease of native coronary artery without angina pectoris: Secondary | ICD-10-CM | POA: Diagnosis not present

## 2020-03-09 DIAGNOSIS — M8580 Other specified disorders of bone density and structure, unspecified site: Secondary | ICD-10-CM

## 2020-03-09 DIAGNOSIS — K219 Gastro-esophageal reflux disease without esophagitis: Secondary | ICD-10-CM

## 2020-03-09 DIAGNOSIS — J449 Chronic obstructive pulmonary disease, unspecified: Secondary | ICD-10-CM

## 2020-03-09 LAB — IBC + FERRITIN
Ferritin: 46.5 ng/mL (ref 10.0–291.0)
Iron: 68 ug/dL (ref 42–145)
Saturation Ratios: 18.9 % — ABNORMAL LOW (ref 20.0–50.0)
Transferrin: 257 mg/dL (ref 212.0–360.0)

## 2020-03-09 LAB — CBC WITH DIFFERENTIAL/PLATELET
Basophils Absolute: 0 10*3/uL (ref 0.0–0.1)
Basophils Relative: 0.3 % (ref 0.0–3.0)
Eosinophils Absolute: 0 10*3/uL (ref 0.0–0.7)
Eosinophils Relative: 0.5 % (ref 0.0–5.0)
HCT: 38.6 % (ref 36.0–46.0)
Hemoglobin: 12.7 g/dL (ref 12.0–15.0)
Lymphocytes Relative: 14.2 % (ref 12.0–46.0)
Lymphs Abs: 0.8 10*3/uL (ref 0.7–4.0)
MCHC: 33 g/dL (ref 30.0–36.0)
MCV: 94.5 fl (ref 78.0–100.0)
Monocytes Absolute: 0.5 10*3/uL (ref 0.1–1.0)
Monocytes Relative: 8.8 % (ref 3.0–12.0)
Neutro Abs: 4.4 10*3/uL (ref 1.4–7.7)
Neutrophils Relative %: 76.2 % (ref 43.0–77.0)
Platelets: 196 10*3/uL (ref 150.0–400.0)
RBC: 4.08 Mil/uL (ref 3.87–5.11)
RDW: 14.9 % (ref 11.5–15.5)
WBC: 5.7 10*3/uL (ref 4.0–10.5)

## 2020-03-09 LAB — COMPREHENSIVE METABOLIC PANEL
ALT: 18 U/L (ref 0–35)
AST: 18 U/L (ref 0–37)
Albumin: 4.2 g/dL (ref 3.5–5.2)
Alkaline Phosphatase: 64 U/L (ref 39–117)
BUN: 24 mg/dL — ABNORMAL HIGH (ref 6–23)
CO2: 31 mEq/L (ref 19–32)
Calcium: 9.3 mg/dL (ref 8.4–10.5)
Chloride: 100 mEq/L (ref 96–112)
Creatinine, Ser: 0.52 mg/dL (ref 0.40–1.20)
GFR: 116.27 mL/min (ref >60.00)
Glucose, Bld: 112 mg/dL — ABNORMAL HIGH (ref 70–99)
Potassium: 3.9 mEq/L (ref 3.5–5.1)
Sodium: 138 mEq/L (ref 135–145)
Total Bilirubin: 0.3 mg/dL (ref 0.2–1.2)
Total Protein: 6.2 g/dL (ref 6.0–8.3)

## 2020-03-09 LAB — LIPID PANEL
Cholesterol: 162 mg/dL (ref 0–200)
HDL: 56.9 mg/dL (ref >39.00)
LDL Cholesterol: 77 mg/dL (ref 0–99)
NonHDL: 104.9
Total CHOL/HDL Ratio: 3
Triglycerides: 142 mg/dL (ref 0.0–149.0)
VLDL: 28.4 mg/dL (ref 0.0–40.0)

## 2020-03-09 LAB — VITAMIN B12: Vitamin B-12: 525 pg/mL (ref 211–911)

## 2020-03-09 NOTE — Assessment & Plan Note (Addendum)
Patient recently treated for COPD exacerbation with Augmentin and prednisone.  She is compliant with Symbicort and Spiriva.  She states despite her treatment she continues to have some chest congestion, wheeze, cough.  She also has very tender frontal sinuses.  She actually has scheduled an ENT visit for tomorrow. -Discussed possibly using a different antibiotic to treat sinusitis-she wants to hold off until ENT visit due to prior yeast infection with last antibiotic. -With ongoing cough and wheeze as well as faint crackle at lung bases-I recommended a chest x-ray and patient is in agreement as she agrees to [place] location to obtain this.  Since recently treated with Augmentin but likely use something with atypical coverage like doxycycline or azithromycin

## 2020-03-09 NOTE — Assessment & Plan Note (Signed)
Well-controlled on amlodipine 5 mg, propranolol.  Continue current medications

## 2020-03-09 NOTE — Assessment & Plan Note (Signed)
#  Reflux is well controlled on Dexilant.  Barrett's previously mentioned by Dr. Maurene Capes but no mention on last endoscopy February 2019 with Dr. Loletha Carrow.  We will check a B12 level given long-term PPI use

## 2020-03-09 NOTE — Assessment & Plan Note (Addendum)
#   Depression/prolonged grieving S: Medication:pristiq 100mg , zoloft 50mg   Depression screen [facility] 2/9 02/17/2020 11/09/2019 09/28/2019  Decreased Interest 0 1 2  Down, Depressed, Hopeless 3 1 2   PHQ - 2 Score 3 2 4   Altered sleeping 2 3 2   Tired, decreased energy 2 1 2   Change in appetite 2 0 0  Feeling bad or failure about yourself  1 1 1   Trouble concentrating 2 1 2   Moving slowly or fidgety/restless 0 0 0  Suicidal thoughts 1 0 0  PHQ-9 Score 13 8 11   Difficult doing work/chores Somewhat difficult Somewhat difficult Somewhat difficult  Some recent data might be hidden  A/P: phq9 still high with prolonged grieving- ideally would reduce meds but with symptoms do not think we can. Encouraged hospice grief counseling- team will give her some info to reach out.  She has not expressed interest in traditional therapy -Sparing lorazepam for anxiety

## 2020-03-09 NOTE — Progress Notes (Signed)
Phone [phone] In person visit   Subjective:   Katherine Foley is a 68 y.o. year old very pleasant female patient who presents for/with See problem oriented charting  This visit occurred during the SARS-CoV-2 public health emergency.  Safety protocols were in place, including screening questions prior to the visit, additional usage of staff PPE, and extensive cleaning of exam room while observing appropriate contact time as indicated for disinfecting solutions.   Past Medical History-  Patient Active Problem List   Diagnosis Date Noted  . H/O parotidectomy 04/28/2019    Priority: High  . 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 Depression 09/22/2015    Priority: High  . Former smoker 09/22/2015    Priority: High  . S/P AVR (aortic valve replacement) 11/06/2013    Priority: High  . Aortic stenosis 07/07/2010    Priority: High  . CAD (coronary artery disease) 05/17/2010    Priority: High  . COPD (chronic obstructive pulmonary disease) ([place]) 07/23/2007    Priority: High  . Restless legs 08/25/2019    Priority: Medium  . Hot flashes 03/22/2016    Priority: Medium  . Diastolic dysfunction 76/73/4193    Priority: Medium  . GERD (gastroesophageal reflux disease) 06/04/2014    Priority: Medium  . Carotid stenosis 05/17/2010    Priority: Medium  . HLD (hyperlipidemia) 08/29/2009    Priority: Medium  . Essential hypertension 04/14/2007    Priority: Medium  . Osteopenia 04/14/2007    Priority: Medium  . Genetic testing 02/10/2018    Priority: Low  . Family history of breast cancer     Priority: Low  . Allergic rhinitis 09/22/2015    Priority: Low  . Adenomatous colon polyp     Priority: Low  . Herpes simplex virus (HSV) infection 01/16/2010    Priority: Low    Medications- reviewed and updated Current Outpatient Medications  Medication Sig Dispense Refill  . Albuterol Sulfate (PROAIR RESPICLICK) 790 (90 Base) MCG/ACT AEPB Inhale 1 puff into the  lungs every 6 (six) hours as needed (wheezing or shortness of breath). 1 each 3  . amLODipine (NORVASC) 5 MG tablet Take 1 tablet (5 mg total) by mouth daily. 90 tablet 3  . aspirin EC 81 MG tablet Take 1 tablet (81 mg total) by mouth daily.    . COLLODION FLEXIBLE EX Apply topically.    Marland Kitchen desvenlafaxine (PRISTIQ) 100 MG 24 hr tablet Take 1 tablet by mouth once daily 90 tablet 0  . DEXILANT 60 MG capsule Take 1 capsule by mouth once daily 90 capsule 0  . gabapentin (NEURONTIN) 300 MG capsule Take 1 capsule by mouth once daily at bedtime 30 capsule 0  . LORazepam (ATIVAN) 0.5 MG tablet TAKE 1 TABLET BY MOUTH TWICE DAILY AS NEEDED FOR ANXIETY. DON'T DRIVE 8 HOURS AFTER USING 60 tablet 0  . Multiple Vitamins-Minerals (MULTIVITAMIN ADULT PO) Take 1 tablet by mouth daily.     . nitroGLYCERIN (NITROSTAT) 0.4 MG SL tablet Place 1 tablet (0.4 mg total) under the tongue every 5 (five) minutes as needed for chest pain (if pain does not resolve with 2 doses, call 911). 30 tablet 5  . propranolol (INDERAL) 20 MG tablet Take 1 tablet by mouth twice daily 180 tablet 0  . rosuvastatin (CRESTOR) 20 MG tablet Take 1 tablet (20 mg total) by mouth daily. 30 tablet 5  . sertraline (ZOLOFT) 50 MG tablet Take 1 tablet (50 mg total) by mouth daily. 90 tablet  3  . SPIRIVA HANDIHALER 18 MCG inhalation capsule 2 puffs by mouth bid 90 capsule 0  . SYMBICORT 160-4.5 MCG/ACT inhaler Inhale 2 puffs by mouth twice daily 11 g 0  . cetirizine (ZYRTEC) 10 MG tablet Take 10 mg by mouth daily as needed for allergies.  (Patient not taking: Reported on 03/09/2020)    . fluticasone (FLONASE) 50 MCG/ACT nasal spray Use 2 spray(s) in each nostril once daily (Patient not taking: Reported on 03/09/2020) 16 g 5   No current facility-administered medications for this visit.     Objective:  BP 118/62   Pulse 63   Temp 98.2 F (36.8 C) (Temporal)   Ht 5' 2.5" (1.588 m)   Wt 129 lb (58.5 kg)   SpO2 94%   BMI 23.22 kg/m  Gen: NAD,  resting comfortably     Assessment and Plan   Depression # Depression/prolonged grieving S: Medication:pristiq 100mg , zoloft 50mg   Depression screen [facility] 2/9 02/17/2020 11/09/2019 09/28/2019  Decreased Interest 0 1 2  Down, Depressed, Hopeless 3 1 2   PHQ - 2 Score 3 2 4   Altered sleeping 2 3 2   Tired, decreased energy 2 1 2   Change in appetite 2 0 0  Feeling bad or failure about yourself  1 1 1   Trouble concentrating 2 1 2   Moving slowly or fidgety/restless 0 0 0  Suicidal thoughts 1 0 0  PHQ-9 Score 13 8 11   Difficult doing work/chores Somewhat difficult Somewhat difficult Somewhat difficult  Some recent data might be hidden  A/P: phq9 still high with prolonged grieving- ideally would reduce meds but with symptoms do not think we can. Encouraged hospice grief counseling- team will give her some info to reach out.  She has not expressed interest in traditional therapy -Sparing lorazepam for anxiety  GERD (gastroesophageal reflux disease) #Reflux is well controlled on Dexilant.  Barrett's previously mentioned by Dr. Maurene Capes but no mention on last endoscopy February 2019 with Dr. Loletha Carrow.  We will check a B12 level given long-term PPI use  Aortic stenosis Patient with history of valve replacement due to severe aortic stenosis.  Last seen October 2020 by cardiology and notes suggest 1 year follow-up with Dr. Burt Knack or Richardson Dopp.  No signs or symptoms of worsening disease at present  COPD (chronic obstructive pulmonary disease) ([place]) Patient recently treated for COPD exacerbation with Augmentin and prednisone.  She is compliant with Symbicort and Spiriva.  She states despite her treatment she continues to have some chest congestion, wheeze, cough.  She also has very tender frontal sinuses.  She actually has scheduled an ENT visit for tomorrow. -Discussed possibly using a different antibiotic to treat sinusitis-she wants to hold off until ENT visit due to prior yeast infection with last  antibiotic. -With ongoing cough and wheeze as well as faint crackle at lung bases-I recommended a chest x-ray and patient is in agreement as she agrees to [place] location to obtain this.  Since recently treated with Augmentin but likely use something with atypical coverage like doxycycline or azithromycin  CAD (coronary artery disease) Denies chest pain or shortness of breath outside of recent COPD exacerbation.  Compliant with aspirin, statin, propranolol.  Appears stable-continue to monitor  Osteopenia Last bone density in 2017.  She agrees to repeat at this time.  Ordered today.  Will need to check on vitamin D dosing after bone density  HLD (hyperlipidemia) Compliant with Crestor 40 mg.  LDL has been at goal under 70 Lab Results  Component Value Date   CHOL 163 07/01/2019   HDL 70 07/01/2019   LDLCALC 68 07/01/2019   LDLDIRECT 65.0 06/19/2017   TRIG 146 07/01/2019   CHOLHDL 2.3 07/01/2019     Essential hypertension Well-controlled on amlodipine 5 mg, propranolol.  Continue current medications  Carotid stenosis From 2017 exam "Stable mild plaque, bilaterally. 1-39% bilateral ICA stenosis. Severe >50% RECA stenosis. Patent vertebral arteries with antegrade flow. Normal subclavian arteries, bilaterally."  We will repeat duplex at this time to make sure not worsening. Risk factors appropriately modified so doubt will be progressive  Recommended follow up: Return in about 6 months (around 09/09/2020) for physical or sooner if needed. No future appointments.  Lab/Order associations:   ICD-10-CM   2. Essential hypertension  I10 CBC with Differential/Platelet    Comprehensive metabolic panel    Lipid panel  3. Coronary artery disease involving native coronary artery of native heart without angina pectoris  I25.10   4. Gastroesophageal reflux disease, unspecified whether esophagitis present  K21.9   5. Osteopenia, unspecified location  M85.80 DG Bone Density  6. Hyperlipidemia,  unspecified hyperlipidemia type  E78.5 Lipid panel  7. Former smoker  Z87.891 POCT Urinalysis Dipstick (Automated)  8. Restless legs  G25.81   9. Anemia, unspecified type  D64.9 IBC + Ferritin    CBC with Differential/Platelet  10. High risk medication use  Z79.899 Vitamin B12  11. Stenosis of carotid artery, unspecified laterality  I65.29 VAS US CAROTID  12. Cough  R05 DG Chest 2 View  13. Recurrent major depressive disorder, in partial remission ([place])  F33.41   14. Aortic valve stenosis, etiology of cardiac valve disease unspecified  I35.0   15. Chronic obstructive pulmonary disease, unspecified COPD type ([place])  J44.9     Return precautions advised.  Garret Reddish, MD

## 2020-03-09 NOTE — Assessment & Plan Note (Signed)
Compliant with Crestor 40 mg.  LDL has been at goal under 70 Lab Results  Component Value Date   CHOL 163 07/01/2019   HDL 70 07/01/2019   LDLCALC 68 07/01/2019   LDLDIRECT 65.0 06/19/2017   TRIG 146 07/01/2019   CHOLHDL 2.3 07/01/2019

## 2020-03-09 NOTE — Assessment & Plan Note (Signed)
From 2017 exam "Stable mild plaque, bilaterally. 1-39% bilateral ICA stenosis. Severe >50% RECA stenosis. Patent vertebral arteries with antegrade flow. Normal subclavian arteries, bilaterally."  We will repeat duplex at this time to make sure not worsening. Risk factors appropriately modified so doubt will be progressive

## 2020-03-09 NOTE — Assessment & Plan Note (Signed)
Patient with history of valve replacement due to severe aortic stenosis.  Last seen October 2020 by cardiology and notes suggest 1 year follow-up with Dr. Burt Knack or Richardson Dopp.  No signs or symptoms of worsening disease at present

## 2020-03-09 NOTE — Assessment & Plan Note (Signed)
Last bone density in 2017.  She agrees to repeat at this time.  Ordered today.  Will need to check on vitamin D dosing after bone density

## 2020-03-09 NOTE — Assessment & Plan Note (Signed)
Denies chest pain or shortness of breath outside of recent COPD exacerbation.  Compliant with aspirin, statin, propranolol.  Appears stable-continue to monitor

## 2020-03-10 ENCOUNTER — Encounter: Payer: Self-pay | Admitting: Family Medicine

## 2020-03-10 ENCOUNTER — Other Ambulatory Visit: Payer: Self-pay | Admitting: *Deleted

## 2020-03-10 DIAGNOSIS — J310 Chronic rhinitis: Secondary | ICD-10-CM | POA: Diagnosis not present

## 2020-03-10 DIAGNOSIS — J343 Hypertrophy of nasal turbinates: Secondary | ICD-10-CM | POA: Diagnosis not present

## 2020-03-10 DIAGNOSIS — I700 Atherosclerosis of aorta: Secondary | ICD-10-CM | POA: Insufficient documentation

## 2020-03-10 MED ORDER — ROSUVASTATIN CALCIUM 40 MG PO TABS: 40.0000 mg | ORAL_TABLET | Freq: Every day | ORAL | 1 refills | Status: DC

## 2020-03-21 ENCOUNTER — Other Ambulatory Visit: Payer: Self-pay | Admitting: Family Medicine

## 2020-04-07 ENCOUNTER — Other Ambulatory Visit: Payer: Self-pay

## 2020-04-07 ENCOUNTER — Ambulatory Visit (HOSPITAL_COMMUNITY)
Admission: RE | Admit: 2020-04-07 | Discharge: 2020-04-07 | Disposition: A | Discharge status: Home / Self Care | Payer: PPO | Source: Ambulatory Visit | Attending: Cardiovascular Disease | Admitting: Cardiovascular Disease

## 2020-04-07 DIAGNOSIS — I6529 Occlusion and stenosis of unspecified carotid artery: Secondary | ICD-10-CM | POA: Diagnosis not present

## 2020-04-07 DIAGNOSIS — I6523 Occlusion and stenosis of bilateral carotid arteries: Secondary | ICD-10-CM | POA: Diagnosis not present

## 2020-04-17 ENCOUNTER — Other Ambulatory Visit: Payer: Self-pay | Admitting: Family Medicine

## 2020-04-21 ENCOUNTER — Ambulatory Visit (INDEPENDENT_AMBULATORY_CARE_PROVIDER_SITE_OTHER): Payer: PPO

## 2020-04-21 DIAGNOSIS — Z0000 Encounter for general adult medical examination without abnormal findings: Secondary | ICD-10-CM

## 2020-04-21 NOTE — Patient Instructions (Addendum)
Katherine Foley , Thank you for taking time to come for your Medicare Wellness Visit. I appreciate your ongoing commitment to your health goals. Please review the following plan we discussed and let me know if I can assist you in the future.   Screening recommendations/referrals: Colonoscopy: Done 07/14/14 Mammogram: Done 04/02/19 Bone Density: Done 03/09/20 Recommended yearly ophthalmology/optometry visit for glaucoma screening and checkup Recommended yearly dental visit for hygiene and checkup  Vaccinations: Influenza vaccine: Up to date Pneumococcal vaccine: Up to date Tdap vaccine: Done 03/17/20 Shingles vaccine: Completed 03/10/18 & 07/15/18 Covid-19:Completed 2/16 & 11/09/19  Advanced directives: Please bring a copy of your health care power of attorney and living will to the office at your convenience.   Conditions/risks identified: Sta y healthy and keep walking   Next appointment: Follow up in one year for your annual wellness visit    Preventive Care 65 Years and Older, Female Preventive care refers to lifestyle choices and visits with your health care provider that can promote health and wellness. What does preventive care include?  A yearly physical exam. This is also called an annual well check.  Dental exams once or twice a year.  Routine eye exams. Ask your health care provider how often you should have your eyes checked.  Personal lifestyle choices, including:  Daily care of your teeth and gums.  Regular physical activity.  Eating a healthy diet.  Avoiding tobacco and drug use.  Limiting alcohol use.  Practicing safe sex.  Taking low-dose aspirin every day.  Taking vitamin and mineral supplements as recommended by your health care provider. What happens during an annual well check? The services and screenings done by your health care provider during your annual well check will depend on your age, overall health, lifestyle risk factors, and family history of  disease. Counseling  Your health care provider may ask you questions about your:  Alcohol use.  Tobacco use.  Drug use.  Emotional well-being.  Home and relationship well-being.  Sexual activity.  Eating habits.  History of falls.  Memory and ability to understand (cognition).  Work and work Statistician.  Reproductive health. Screening  You may have the following tests or measurements:  Height, weight, and BMI.  Blood pressure.  Lipid and cholesterol levels. These may be checked every 5 years, or more frequently if you are over 70 years old.  Skin check.  Lung cancer screening. You may have this screening every year starting at age 79 if you have a 30-pack-year history of smoking and currently smoke or have quit within the past 15 years.  Fecal occult blood test (FOBT) of the stool. You may have this test every year starting at age 41.  Flexible sigmoidoscopy or colonoscopy. You may have a sigmoidoscopy every 5 years or a colonoscopy every 10 years starting at age 27.  Hepatitis C blood test.  Hepatitis B blood test.  Sexually transmitted disease (STD) testing.  Diabetes screening. This is done by checking your blood sugar (glucose) after you have not eaten for a while (fasting). You may have this done every 1-3 years.  Bone density scan. This is done to screen for osteoporosis. You may have this done starting at age 59.  Mammogram. This may be done every 1-2 years. Talk to your health care provider about how often you should have regular mammograms. Talk with your health care provider about your test results, treatment options, and if necessary, the need for more tests. Vaccines  Your health care provider  may recommend certain vaccines, such as:  Influenza vaccine. This is recommended every year.  Tetanus, diphtheria, and acellular pertussis (Tdap, Td) vaccine. You may need a Td booster every 10 years.  Zoster vaccine. You may need this after age  83.  Pneumococcal 13-valent conjugate (PCV13) vaccine. One dose is recommended after age 14.  Pneumococcal polysaccharide (PPSV23) vaccine. One dose is recommended after age 78. Talk to your health care provider about which screenings and vaccines you need and how often you need them. This information is not intended to replace advice given to you by your health care provider. Make sure you discuss any questions you have with your health care provider. Document Released: 09/16/2015 Document Revised: 05/09/2016 Document Reviewed: 06/21/2015 Elsevier Interactive Patient Education  2017 [employer] Prevention in the Home Falls can cause injuries. They can happen to people of all ages. There are many things you can do to make your home safe and to help prevent falls. What can I do on the outside of my home?  Regularly fix the edges of walkways and driveways and fix any cracks.  Remove anything that might make you trip as you walk through a door, such as a raised step or threshold.  Trim any bushes or trees on the path to your home.  Use bright outdoor lighting.  Clear any walking paths of anything that might make someone trip, such as rocks or tools.  Regularly check to see if handrails are loose or broken. Make sure that both sides of any steps have handrails.  Any raised decks and porches should have guardrails on the edges.  Have any leaves, snow, or ice cleared regularly.  Use sand or salt on walking paths during winter.  Clean up any spills in your garage right away. This includes oil or grease spills. What can I do in the bathroom?  Use night lights.  Install grab bars by the toilet and in the tub and shower. Do not use towel bars as grab bars.  Use non-skid mats or decals in the tub or shower.  If you need to sit down in the shower, use a plastic, non-slip stool.  Keep the floor dry. Clean up any water that spills on the floor as soon as it happens.  Remove  soap buildup in the tub or shower regularly.  Attach bath mats securely with double-sided non-slip rug tape.  Do not have throw rugs and other things on the floor that can make you trip. What can I do in the bedroom?  Use night lights.  Make sure that you have a light by your bed that is easy to reach.  Do not use any sheets or blankets that are too big for your bed. They should not hang down onto the floor.  Have a firm chair that has side arms. You can use this for support while you get dressed.  Do not have throw rugs and other things on the floor that can make you trip. What can I do in the kitchen?  Clean up any spills right away.  Avoid walking on wet floors.  Keep items that you use a lot in easy-to-reach places.  If you need to reach something above you, use a strong step stool that has a grab bar.  Keep electrical cords out of the way.  Do not use floor polish or wax that makes floors slippery. If you must use wax, use non-skid floor wax.  Do not have throw rugs  and other things on the floor that can make you trip. What can I do with my stairs?  Do not leave any items on the stairs.  Make sure that there are handrails on both sides of the stairs and use them. Fix handrails that are broken or loose. Make sure that handrails are as long as the stairways.  Check any carpeting to make sure that it is firmly attached to the stairs. Fix any carpet that is loose or worn.  Avoid having throw rugs at the top or bottom of the stairs. If you do have throw rugs, attach them to the floor with carpet tape.  Make sure that you have a light switch at the top of the stairs and the bottom of the stairs. If you do not have them, ask someone to add them for you. What else can I do to help prevent falls?  Wear shoes that:  Do not have high heels.  Have rubber bottoms.  Are comfortable and fit you well.  Are closed at the toe. Do not wear sandals.  If you use a  stepladder:  Make sure that it is fully opened. Do not climb a closed stepladder.  Make sure that both sides of the stepladder are locked into place.  Ask someone to hold it for you, if possible.  Clearly mark and make sure that you can see:  Any grab bars or handrails.  First and last steps.  Where the edge of each step is.  Use tools that help you move around (mobility aids) if they are needed. These include:  Canes.  Walkers.  Scooters.  Crutches.  Turn on the lights when you go into a dark area. Replace any light bulbs as soon as they burn out.  Set up your furniture so you have a clear path. Avoid moving your furniture around.  If any of your floors are uneven, fix them.  If there are any pets around you, be aware of where they are.  Review your medicines with your doctor. Some medicines can make you feel dizzy. This can increase your chance of falling. Ask your doctor what other things that you can do to help prevent falls. This information is not intended to replace advice given to you by your health care provider. Make sure you discuss any questions you have with your health care provider. Document Released: 06/16/2009 Document Revised: 01/26/2016 Document Reviewed: 09/24/2014 Elsevier Interactive Patient Education  2017 [employer].

## 2020-04-21 NOTE — Progress Notes (Addendum)
Virtual Visit via Telephone Note  I connected with  Katherine Foley on 04/21/20 at  9:30 AM EDT by telephone and verified that I am speaking with the correct person using two identifiers.  Medicare Annual Wellness visit completed telephonically due to Covid-19 pandemic.   Persons participating in this call: This Health Coach and this patient.   Location: Patient: Home Provider: Office   I discussed the limitations, risks, security and privacy concerns of performing an evaluation and management service by telephone and the availability of in person appointments. The patient expressed understanding and agreed to proceed.  Unable to perform video visit due to video visit attempted and failed and/or patient does not have video capability.   Some vital signs may be absent or patient reported.   Willette Brace, LPN    Subjective:   Katherine Foley is a 69 y.o. female who presents for Medicare Annual (Subsequent) preventive examination.  Review of Systems     Cardiac Risk Factors include: advanced age (>60mn, >>27women);dyslipidemia;hypertension     Objective:    There were no vitals filed for this visit. There is no height or weight on file to calculate BMI.  Advanced Directives 04/21/2020 04/28/2019 04/22/2019 05/26/2018 04/04/2018 10/14/2017 03/29/2017  Does Patient Have a Medical Advance Directive? Yes No No No No No No  Type of [place] - - - - - -  Copy of [place]? No - copy requested - - - - - -  Would patient like information on creating a medical advance directive? - No - Patient declined No - Patient declined No - Patient declined (No Data) No - Patient declined Yes (MAU/Ambulatory/Procedural Areas - Information given)    Current Medications (verified) Outpatient Encounter Medications as of 04/21/2020  Medication Sig  . Albuterol Sulfate (PROAIR RESPICLICK) 1536(90 Base) MCG/ACT AEPB  Inhale 1 puff into the lungs every 6 (six) hours as needed (wheezing or shortness of breath).  .Marland KitchenamLODipine (NORVASC) 5 MG tablet Take 1 tablet (5 mg total) by mouth daily.  .Marland Kitchenaspirin EC 81 MG tablet Take 1 tablet (81 mg total) by mouth daily.  . cetirizine (ZYRTEC) 10 MG tablet Take 10 mg by mouth daily as needed for allergies.   . COLLODION FLEXIBLE EX Apply topically.  .Marland Kitchendesvenlafaxine (PRISTIQ) 100 MG 24 hr tablet Take 1 tablet by mouth once daily  . DEXILANT 60 MG capsule Take 1 capsule by mouth once daily  . fluticasone (FLONASE) 50 MCG/ACT nasal spray Use 2 spray(s) in each nostril once daily  . gabapentin (NEURONTIN) 300 MG capsule Take 1 capsule by mouth once daily at bedtime  . LORazepam (ATIVAN) 0.5 MG tablet TAKE 1 TABLET BY MOUTH TWICE DAILY AS NEEDED FOR ANXIETY. DON'T DRIVE 8 HOURS AFTER USING  . Multiple Vitamins-Minerals (MULTIVITAMIN ADULT PO) Take 1 tablet by mouth daily.   . nitroGLYCERIN (NITROSTAT) 0.4 MG SL tablet Place 1 tablet (0.4 mg total) under the tongue every 5 (five) minutes as needed for chest pain (if pain does not resolve with 2 doses, call 911).  . propranolol (INDERAL) 20 MG tablet Take 1 tablet by mouth twice daily  . rosuvastatin (CRESTOR) 40 MG tablet Take 1 tablet (40 mg total) by mouth daily.  . sertraline (ZOLOFT) 50 MG tablet Take 1 tablet (50 mg total) by mouth daily.  .Marland KitchenSPIRIVA HANDIHALER 18 MCG inhalation capsule 2 puffs by mouth bid  . SYMBICORT 160-4.5 MCG/ACT  inhaler Inhale 2 puffs by mouth twice daily  . [DISCONTINUED] Aclidinium Bromide (TUDORZA PRESSAIR) 400 MCG/ACT AEPB Inhale 1 puff into the lungs 2 (two) times daily.   No facility-administered encounter medications on file as of 04/21/2020.    Allergies (verified) Codeine sulfate and Hydrocodone-acetaminophen   History: Past Medical History:  Diagnosis Date  . Adenomatous colon polyp   . Anxiety   . ANXIETY DEPRESSION 02/15/2009  . AORTIC STENOSIS 07/07/2010   a. echo 12/23/10: EF  60-65%, mod to severe AS with mean gradient 29 mmHg;  b. 04/2011 s/p bioprosthetic AVR. // Echo 9/19:  Mild conc LVH, EF 60-65, no RWMA, Gr 1 DD, AVR ok (mean 10, peak 21), trivial MR, normal RVSF, mild TR   . Barrett's esophagus 04/14/2007  . Barrett's esophagus 2000  . CAD, NATIVE VESSEL 05/17/2010   a. NSTEMI 8/11 - BMS to RCA;  b. cath 4/12: dLAD 50%, RI 80% (PCI), AVCFX 30%, pRCA 30%, stent ok, 40-50, then 50% dist RCA;  c. s/p Promus DES to RI 12/25/10;  d. 04/2011 CABG x 1 (LIMA->LAD) @ time of AVR  . Carotid stenosis 05/17/2010   a. dopplers 32/12: RICA 24-82%; LICA 5-00% (follow up due 11/12)  . Chronic bronchitis ([place])    "multiple times" (03/25/2013)  . 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 Exertional shortness of breath    "related to COPD problems" (03/25/2013)  . Family history of breast cancer    2 daughters; PALB2 mutation in family  . Genetic testing 02/10/2018   PALB2 analysis at [facility] - Negative for familial mutation  . GERD (gastroesophageal reflux disease)   . H/O hiatal hernia   . Hemorrhoid thrombosis    "had it lanced" (03/25/2013)  . Herpes simplex without mention of complication 3/70/4888  . History of blood transfusion    'w/OHS" (03/25/2013)  . [place], MILD 08/29/2009  . HYPERTENSION 04/14/2007  . MIGRAINE HEADACHE 04/20/2008  . Myocardial infarction [facility]) 2011   "during catherization" (03/25/2013)  . OSTEOPENIA 04/14/2007  . Ovarian cyst   . Parotid mass    left  . Pneumonia    "multiple times" (03/25/2013)  . URINARY INCONTINENCE, STRESS, MILD 08/26/2007  . Urine, incontinence, stress female    Past Surgical History:  Procedure Laterality Date  . AORTIC VALVE REPLACEMENT  2012  . APPENDECTOMY    . BLEPHAROPLASTY Bilateral ~ 1998  . CARDIAC CATHETERIZATION N/A 07/06/2016   Procedure: Left Heart Cath and Cors/Grafts Angiography;  Surgeon: Sherren Mocha, MD;  Location: [place] [facility];  Service: Cardiovascular;  Laterality: N/A;    . CHOLECYSTECTOMY N/A 03/25/2013   Procedure: LAPAROSCOPIC CHOLECYSTECTOMY WITH INTRAOPERATIVE CHOLANGIOGRAM;  Surgeon: Imogene Burn. Georgette Dover, MD;  Location: [place];  Service: General;  Laterality: N/A;  . CORONARY ANGIOGRAPHY N/A 05/26/2018   Procedure: CORONARY ANGIOGRAPHY (CATH LAB);  Surgeon: Jettie Booze, MD;  Location: [place] [facility];  Service: Cardiovascular;  Laterality: N/A;  . CORONARY ANGIOPLASTY WITH STENT PLACEMENT  2010   "I have 2 stents" (03/25/2013)  . NASAL SINUS SURGERY  1997; 1998   "cust in maxillary sinus; put a stent in then removed it" (03/25/2013)  . OVARIAN CYST REMOVAL Right 2011   "size of a soccer ball" (03/25/2013)  . PAROTIDECTOMY Left 04/28/2019   Procedure: LEFT TOTAL PAROTIDECTOMY;  Surgeon: Leta Baptist, MD;  Location: [place];  Service: ENT;  Laterality: Left;  . thrombosed hemorrohid lanced    . TONSILLECTOMY AND ADENOIDECTOMY    .  TOTAL ABDOMINAL HYSTERECTOMY     Family History  Problem Relation Age of Onset  . Coronary artery disease Other   . Heart disease Other        6 stents  . Heart disease Mother   . COPD Father        smoked  . Heart disease Father   . Rheum arthritis Father   . Heart disease Maternal Uncle   . Breast cancer Daughter        currently 64  . Asthma Son        as a child   . Breast cancer Daughter 102       currently 38; PALB2 mutation  . Cancer Other        very distant paternal cousin; pancreatic ca  . Cirrhosis Sister   . Stroke Brother   . Colon cancer Neg Hx   . Colon polyps Neg Hx    Social History   Socioeconomic History  . Marital status: Single    Spouse name: Not on file  . Number of children: 3  . Years of education: Not on file  . Highest education level: Not on file  Occupational History  . Occupation: Glass blower/designer: [employer]  . Occupation: retired  Tobacco Use  . Smoking status: Former Smoker    Packs/day: 1.50    Years: 44.00    Pack years: 66.00    Types: Cigarettes     Quit date: 09/03/2005    Years since quitting: 14.6  . Smokeless tobacco: Never Used  Vaping Use  . Vaping Use: Never used  Substance and Sexual Activity  . Alcohol use: Yes    Alcohol/week: 0.0 standard drinks    Comment: social  . Drug use: No  . Sexual activity: Not Currently    Birth control/protection: Post-menopausal  Other Topics Concern  . Not on file  Social History Narrative   Prolonged engagement 17 years in 2017. 3 children. 6 grandkids. 2 greatgrandkids. Keeps greatgrandson on tuesdays- loves her so much.       Retired from [employer] (parttime- Dec 12)   Faith is very important to her      Hobbies: reading, cross stich, sew. Wants to learn to knit.       Social Determinants of Health   Financial Resource Strain: Low Risk   . Difficulty of Paying Living Expenses: Not hard at all  Food Insecurity: No Food Insecurity  . Worried About Charity fundraiser in the Last Year: Never true  . Ran Out of Food in the Last Year: Never true  Transportation Needs: No Transportation Needs  . Lack of Transportation (Medical): No  . Lack of Transportation (Non-Medical): No  Physical Activity: Sufficiently Active  . Days of Exercise per Week: 7 days  . Minutes of Exercise per Session: 60 min  Stress: Stress Concern Present  . Feeling of Stress : Rather much  Social Connections: Moderately Isolated  . Frequency of Communication with Friends and Family: More than three times a week  . Frequency of Social Gatherings with Friends and Family: Three times a week  . Attends Religious Services: More than 4 times per year  . Active Member of Clubs or Organizations: No  . Attends Archivist Meetings: Never  . Marital Status: Never married    Tobacco Counseling Counseling given: Not Answered   Clinical Intake:  Pre-visit preparation completed: Yes  Pain : No/denies pain     BMI -  recorded: 23.22 Nutritional Status: BMI of 19-24  Normal Nutritional Risks:  None Diabetes: No  How often do you need to have someone help you when you read instructions, pamphlets, or other written materials from your doctor or pharmacy?: 1 - Never  Diabetic?No  Interpreter Needed?: No  Information entered by :: Charlott Rakes, LPN   Activities of Daily Living In your present state of health, do you have any difficulty performing the following activities: 04/21/2020 04/28/2019  Hearing? N N  Vision? N Y  Comment - needs glasses at all times  Difficulty concentrating or making decisions? N N  Walking or climbing stairs? N N  Dressing or bathing? N N  Doing errands, shopping? N -  Preparing Food and eating ? N -  Using the Toilet? N -  In the past six months, have you accidently leaked urine? Y -  Comment hold too long -  Do you have problems with loss of bowel control? N -  Managing your Medications? N -  Managing your Finances? N -  Housekeeping or managing your Housekeeping? N -  Some recent data might be hidden    Patient Care Team: Marin Olp, MD as PCP - General (Family Medicine) Sherren Mocha, MD as PCP - Cardiology (Cardiology) Bensimhon, Shaune Pascal, MD (Cardiology) Tanda Rockers, MD as Consulting Physician (Pulmonary Disease) Jamesetta Geralds, [place] (Chiropractic Medicine) Leta Baptist, MD as Consulting Physician (Otolaryngology)  Indicate any recent Medical Services you may have received from other than [facility] providers in the past year (date may be approximate).     Assessment:   This is a routine wellness examination for [place].  Hearing/Vision screen  Hearing Screening   125Hz  250Hz  500Hz  1000Hz  2000Hz  3000Hz  4000Hz  6000Hz  8000Hz   Right ear:           Left ear:           Comments: Pt denies any difficulty hearing  Vision Screening Comments: Pt follows up annually with dr Phineas Douglas  Dietary issues and exercise activities discussed: Current Exercise Habits: Home exercise routine, Type of exercise: walking, Time (Minutes): 60, Frequency  (Times/Week): 5, Weekly Exercise (Minutes/Week): 300  Goals    .  Patient Stated (pt-stated)      Find something to focus on during grieving period that provided joy     .  Patient Stated      Stay health and  keep walking    .  Weight (lb) < 145 lb (65.8 kg)      Lose weight by watching caloric intake.       Depression Screen PHQ 2/9 Scores 04/21/2020 02/17/2020 11/09/2019 09/28/2019 08/25/2019 10/30/2018 04/04/2018  PHQ - 2 Score 2 3 2 4 3 3 4   PHQ- 9 Score 7 13 8 11 13 18 21     Fall Risk Fall Risk  04/21/2020 04/16/2019 04/04/2018 03/29/2017 03/22/2016  Falls in the past year? 0 1 Yes Yes No  Number falls in past yr: 1 1 1 2  or more -  Injury with Fall? 0 0 No No -  Risk for fall due to : History of fall(s);Impaired vision - - - -  Follow up - Education provided - Falls prevention discussed -    Any stairs in or around the home? Yes  If so, are there any without handrails? No  Home free of loose throw rugs in walkways, pet beds, electrical cords, etc? No Have to watch for little puppy getting under your feet at times Adequate lighting  in your home to reduce risk of falls? Yes   ASSISTIVE DEVICES UTILIZED TO PREVENT FALLS:  Life alert? No  Use of a cane, walker or w/c? No  Grab bars in the bathroom? Yes  Shower chair or bench in shower? No  Elevated toilet seat or a handicapped toilet? No   TIMED UP AND GO:  Was the test performed? No .      Cognitive Function:     6CIT Screen 04/21/2020 04/04/2018  What Year? 0 points 0 points  What month? 0 points 0 points  What time? - 0 points  Count back from 20 0 points 0 points  Months in reverse 0 points 0 points  Repeat phrase 0 points -    Immunizations Immunization History  Administered Date(s) Administered  . Influenza Split 06/19/2011, 07/02/2012  . Influenza Whole 08/03/2009  . Influenza, High Dose Seasonal PF 05/20/2015, 06/04/2019, 06/04/2019  . Influenza,inj,Quad PF,6+ Mos 05/21/2013, 05/14/2014, 06/17/2018  .  Influenza-Unspecified 08/17/2016, 06/19/2017, 06/17/2018  . PFIZER SARS-COV-2 Vaccination 10/20/2019, 11/09/2019  . Pneumococcal Conjugate-13 06/14/2014  . Pneumococcal Polysaccharide-23 03/22/2016  . Td 08/29/2009, 03/17/2020  . Zoster 01/21/2016  . Zoster Recombinat (Shingrix) 03/10/2018, 07/15/2018    TDAP status: Up to date Flu Vaccine status: Up to date Pneumococcal vaccine status: Up to date Covid-19 vaccine status: Completed vaccines  Qualifies for Shingles Vaccine? Yes   Zostavax completed Yes   Shingrix Completed?: Yes  Screening Tests Health Maintenance  Topic Date Due  . INFLUENZA VACCINE  04/03/2020  . MAMMOGRAM  04/01/2021  . COLONOSCOPY  07/14/2024  . DEXA SCAN  Completed  . COVID-19 Vaccine  Completed  . Hepatitis C Screening  Completed  . PNA vac Low Risk Adult  Completed  . TETANUS/TDAP  Discontinued    Health Maintenance  Health Maintenance Due  Topic Date Due  . INFLUENZA VACCINE  04/03/2020    Colorectal cancer screening: Completed 07/14/14. Repeat every 10 years Mammogram status: Completed 04/02/19. Repeat every year Bone Density status: Completed 03/09/20. Results reflect: Bone density results: OSTEOPENIA. Repeat every 2 years.   Additional Screening:  Hepatitis C Screening: Completed 12/21/15  Vision Screening: Recommended annual ophthalmology exams for early detection of glaucoma and other disorders of the eye. Is the patient up to date with their annual eye exam?  Yes  Who is the provider or what is the name of the office in which the patient attends annual eye exams? Dr Phineas Douglas   Dental Screening: Recommended annual dental exams for proper oral hygiene  Community Resource Referral / Chronic Care Management: CRR required this visit?  No   CCM required this visit?  No      Plan:     I have personally reviewed and noted the following in the patient's chart:   . Medical and social history . Use of alcohol, tobacco or illicit drugs    . Current medications and supplements . Functional ability and status . Nutritional status . Physical activity . Advanced directives . List of other physicians . Hospitalizations, surgeries, and ER visits in previous 12 months . Vitals . Screenings to include cognitive, depression, and falls . Referrals and appointments  In addition, I have reviewed and discussed with patient certain preventive protocols, quality metrics, and best practice recommendations. A written personalized care plan for preventive services as well as general preventive health recommendations were provided to patient.     Willette Brace, LPN   4/70/9628   Nurse Notes: Pt stated she is seeing  counselor for Grief of losing daughter 2 years ago to Cancer , denies any other help at this time.

## 2020-04-22 ENCOUNTER — Encounter: Payer: Self-pay | Admitting: Family Medicine

## 2020-04-22 ENCOUNTER — Telehealth (INDEPENDENT_AMBULATORY_CARE_PROVIDER_SITE_OTHER): Payer: PPO | Admitting: Family Medicine

## 2020-04-22 VITALS — Temp 100.0°F | Ht 62.0 in | Wt 127.0 lb

## 2020-04-22 DIAGNOSIS — J449 Chronic obstructive pulmonary disease, unspecified: Secondary | ICD-10-CM

## 2020-04-22 DIAGNOSIS — J441 Chronic obstructive pulmonary disease with (acute) exacerbation: Secondary | ICD-10-CM | POA: Diagnosis not present

## 2020-04-22 MED ORDER — DOXYCYCLINE HYCLATE 100 MG PO TABS: 100.0000 mg | ORAL_TABLET | Freq: Two times a day (BID) | ORAL | 0 refills | Status: DC

## 2020-04-22 MED ORDER — PREDNISONE 10 MG PO TABS: 10.0000 mg | ORAL_TABLET | Freq: Two times a day (BID) | ORAL | 0 refills | Status: AC

## 2020-04-22 MED ORDER — FLUCONAZOLE 150 MG PO TABS: 150.0000 mg | ORAL_TABLET | Freq: Once | ORAL | 0 refills | Status: AC

## 2020-04-22 NOTE — Progress Notes (Signed)
Established Patient Office Visit  Subjective:  Patient ID: Katherine Foley, female    DOB: [birth year]  Age: 69 y.o. MRN: [id number]  CC:  Chief Complaint  Patient presents with  . Sinusitis    cough, thick green mucus x 1 week fever started this moring     HPI Katherine Foley presents for evaluation of a 6-day history of URI symptoms with headache, nasal congestion, purulent rhinorrhea, postnasal drip, cough with wheezing and productive of purulent phlegm.  Has developed a fever today.  History of COPD and has been compliant with Symbicort and Spiriva.  She uses Proventil as needed.  Denies diarrhea, myalgias, arthralgias, loss of taste or smell.  Status post Covid vaccine series earlier in the spring.  Past Medical History:  Diagnosis Date  . Adenomatous colon polyp   . Anxiety   . ANXIETY DEPRESSION 02/15/2009  . AORTIC STENOSIS 07/07/2010   a. echo 12/23/10: EF 60-65%, mod to severe AS with mean gradient 29 mmHg;  b. 04/2011 s/p bioprosthetic AVR. // Echo 9/19:  Mild conc LVH, EF 60-65, no RWMA, Gr 1 DD, AVR ok (mean 10, peak 21), trivial MR, normal RVSF, mild TR   . Barrett's esophagus 04/14/2007  . Barrett's esophagus 2000  . CAD, NATIVE VESSEL 05/17/2010   a. NSTEMI 8/11 - BMS to RCA;  b. cath 4/12: dLAD 50%, RI 80% (PCI), AVCFX 30%, pRCA 30%, stent ok, 40-50, then 50% dist RCA;  c. s/p Promus DES to RI 12/25/10;  d. 04/2011 CABG x 1 (LIMA->LAD) @ time of AVR  . Carotid stenosis 05/17/2010   a. dopplers 31/54: RICA 00-86%; LICA 7-61% (follow up due 11/12)  . Chronic bronchitis ([place])    "multiple times" (03/25/2013)  . 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 Exertional shortness of breath    "related to COPD problems" (03/25/2013)  . Family history of breast cancer    2 daughters; PALB2 mutation in family  . Genetic testing 02/10/2018   PALB2 analysis at [facility] - Negative for familial mutation  . GERD (gastroesophageal reflux disease)   . H/O hiatal  hernia   . Hemorrhoid thrombosis    "had it lanced" (03/25/2013)  . Herpes simplex without mention of complication 9/50/9326  . History of blood transfusion    'w/OHS" (03/25/2013)  . [place], MILD 08/29/2009  . HYPERTENSION 04/14/2007  . MIGRAINE HEADACHE 04/20/2008  . Myocardial infarction [facility]) 2011   "during catherization" (03/25/2013)  . OSTEOPENIA 04/14/2007  . Ovarian cyst   . Parotid mass    left  . Pneumonia    "multiple times" (03/25/2013)  . URINARY INCONTINENCE, STRESS, MILD 08/26/2007  . Urine, incontinence, stress female     Past Surgical History:  Procedure Laterality Date  . AORTIC VALVE REPLACEMENT  2012  . APPENDECTOMY    . BLEPHAROPLASTY Bilateral ~ 1998  . CARDIAC CATHETERIZATION N/A 07/06/2016   Procedure: Left Heart Cath and Cors/Grafts Angiography;  Surgeon: Sherren Mocha, MD;  Location: [place] CV LAB;  Service: Cardiovascular;  Laterality: N/A;  . CHOLECYSTECTOMY N/A 03/25/2013   Procedure: LAPAROSCOPIC CHOLECYSTECTOMY WITH INTRAOPERATIVE CHOLANGIOGRAM;  Surgeon: Imogene Burn. Georgette Dover, MD;  Location: [place];  Service: General;  Laterality: N/A;  . CORONARY ANGIOGRAPHY N/A 05/26/2018   Procedure: CORONARY ANGIOGRAPHY (CATH LAB);  Surgeon: Jettie Booze, MD;  Location: [place] CV LAB;  Service: Cardiovascular;  Laterality: N/A;  . CORONARY ANGIOPLASTY WITH STENT PLACEMENT  2010   "I have 2 stents" (03/25/2013)  .  NASAL SINUS SURGERY  1997; 1998   "cust in maxillary sinus; put a stent in then removed it" (03/25/2013)  . OVARIAN CYST REMOVAL Right 2011   "size of a soccer ball" (03/25/2013)  . PAROTIDECTOMY Left 04/28/2019   Procedure: LEFT TOTAL PAROTIDECTOMY;  Surgeon: Leta Baptist, MD;  Location: [place];  Service: ENT;  Laterality: Left;  . thrombosed hemorrohid lanced    . TONSILLECTOMY AND ADENOIDECTOMY    . TOTAL ABDOMINAL HYSTERECTOMY      Family History  Problem Relation Age of Onset  . Coronary artery disease Other   .  Heart disease Other        6 stents  . Heart disease Mother   . COPD Father        smoked  . Heart disease Father   . Rheum arthritis Father   . Heart disease Maternal Uncle   . Breast cancer Daughter        currently 11  . Asthma Son        as a child   . Breast cancer Daughter 49       currently 23; PALB2 mutation  . Cancer Other        very distant paternal cousin; pancreatic ca  . Cirrhosis Sister   . Stroke Brother   . Colon cancer Neg Hx   . Colon polyps Neg Hx     Social History   Socioeconomic History  . Marital status: Single    Spouse name: Not on file  . Number of children: 3  . Years of education: Not on file  . Highest education level: Not on file  Occupational History  . Occupation: Glass blower/designer: [id number]  . Occupation: retired  Tobacco Use  . Smoking status: Former Smoker    Packs/day: 1.50    Years: 44.00    Pack years: 66.00    Types: Cigarettes    Quit date: 09/03/2005    Years since quitting: 14.6  . Smokeless tobacco: Never Used  Vaping Use  . Vaping Use: Never used  Substance and Sexual Activity  . Alcohol use: Yes    Alcohol/week: 0.0 standard drinks    Comment: social  . Drug use: No  . Sexual activity: Not Currently    Birth control/protection: Post-menopausal  Other Topics Concern  . Not on file  Social History Narrative   Prolonged engagement 17 years in 2017. 3 children. 6 grandkids. 2 greatgrandkids. Keeps greatgrandson on tuesdays- loves her so much.       Retired from [employer] (parttime- Dec 12)   Faith is very important to her      Hobbies: reading, cross stich, sew. Wants to learn to knit.       Social Determinants of Health   Financial Resource Strain: Low Risk   . Difficulty of Paying Living Expenses: Not hard at all  Food Insecurity: No Food Insecurity  . Worried About Charity fundraiser in the Last Year: Never true  . Ran Out of Food in the Last Year: Never true  Transportation Needs: No Transportation Needs   . Lack of Transportation (Medical): No  . Lack of Transportation (Non-Medical): No  Physical Activity: Sufficiently Active  . Days of Exercise per Week: 7 days  . Minutes of Exercise per Session: 60 min  Stress: Stress Concern Present  . Feeling of Stress : Rather much  Social Connections: Moderately Isolated  . Frequency of Communication with Friends and Family: More  than three times a week  . Frequency of Social Gatherings with Friends and Family: Three times a week  . Attends Religious Services: More than 4 times per year  . Active Member of Clubs or Organizations: No  . Attends Archivist Meetings: Never  . Marital Status: Never married  Intimate Partner Violence:   . Fear of Current or Ex-Partner: Not on file  . Emotionally Abused: Not on file  . Physically Abused: Not on file  . Sexually Abused: Not on file    Outpatient Medications Prior to Visit  Medication Sig Dispense Refill  . Albuterol Sulfate (PROAIR RESPICLICK) 315 (90 Base) MCG/ACT AEPB Inhale 1 puff into the lungs every 6 (six) hours as needed (wheezing or shortness of breath). 1 each 3  . amLODipine (NORVASC) 5 MG tablet Take 1 tablet (5 mg total) by mouth daily. 90 tablet 3  . aspirin EC 81 MG tablet Take 1 tablet (81 mg total) by mouth daily.    . cetirizine (ZYRTEC) 10 MG tablet Take 10 mg by mouth daily as needed for allergies.     . COLLODION FLEXIBLE EX Apply topically.    Marland Kitchen desvenlafaxine (PRISTIQ) 100 MG 24 hr tablet Take 1 tablet by mouth once daily 90 tablet 0  . DEXILANT 60 MG capsule Take 1 capsule by mouth once daily 90 capsule 0  . fluticasone (FLONASE) 50 MCG/ACT nasal spray Use 2 spray(s) in each nostril once daily 16 g 5  . gabapentin (NEURONTIN) 300 MG capsule Take 1 capsule by mouth once daily at bedtime 30 capsule 0  . LORazepam (ATIVAN) 0.5 MG tablet TAKE 1 TABLET BY MOUTH TWICE DAILY AS NEEDED FOR ANXIETY. DON'T DRIVE 8 HOURS AFTER USING 60 tablet 0  . Multiple Vitamins-Minerals  (MULTIVITAMIN ADULT PO) Take 1 tablet by mouth daily.     . nitroGLYCERIN (NITROSTAT) 0.4 MG SL tablet Place 1 tablet (0.4 mg total) under the tongue every 5 (five) minutes as needed for chest pain (if pain does not resolve with 2 doses, call 911). 30 tablet 5  . propranolol (INDERAL) 20 MG tablet Take 1 tablet by mouth twice daily 180 tablet 0  . rosuvastatin (CRESTOR) 40 MG tablet Take 1 tablet (40 mg total) by mouth daily. 90 tablet 1  . sertraline (ZOLOFT) 50 MG tablet Take 1 tablet (50 mg total) by mouth daily. 90 tablet 3  . SPIRIVA HANDIHALER 18 MCG inhalation capsule 2 puffs by mouth bid 90 capsule 0  . SYMBICORT 160-4.5 MCG/ACT inhaler Inhale 2 puffs by mouth twice daily 11 g 0   No facility-administered medications prior to visit.    Allergies  Allergen Reactions  . Codeine Sulfate Itching and Rash  . Hydrocodone-Acetaminophen Itching and Rash    ROS Review of Systems  Constitutional: Negative.   HENT: Positive for congestion, postnasal drip and rhinorrhea. Negative for sinus pressure and sinus pain.   Eyes: Negative for photophobia and visual disturbance.  Respiratory: Positive for cough and wheezing. Negative for chest tightness and shortness of breath.   Cardiovascular: Negative.   Gastrointestinal: Negative.  Negative for diarrhea, nausea and vomiting.  Endocrine: Negative for polyphagia and polyuria.  Genitourinary: Negative.   Musculoskeletal: Negative for arthralgias and myalgias.  Neurological: Positive for headaches.  Hematological: Does not bruise/bleed easily.      Objective:    Physical Exam Vitals and nursing note reviewed.  Constitutional:      General: She is not in acute distress.    Appearance: Normal  appearance. She is not ill-appearing, toxic-appearing or diaphoretic.  HENT:     Head: Normocephalic and atraumatic.     Right Ear: External ear normal.     Left Ear: External ear normal.  Eyes:     General: No scleral icterus.       Right eye: No  discharge.        Left eye: No discharge.     Conjunctiva/sclera: Conjunctivae normal.  Pulmonary:     Effort: Pulmonary effort is normal. No respiratory distress.  Neurological:     Mental Status: She is oriented to person, place, and time.  Psychiatric:        Mood and Affect: Mood normal.        Behavior: Behavior normal.     Temp 100 F (37.8 C) (Tympanic)   Ht 5' 2"  (1.575 m)   Wt 127 lb (57.6 kg)   BMI 23.23 kg/m  Wt Readings from Last 3 Encounters:  04/22/20 127 lb (57.6 kg)  03/09/20 129 lb (58.5 kg)  02/17/20 127 lb (57.6 kg)     Health Maintenance Due  Topic Date Due  . INFLUENZA VACCINE  04/03/2020    There are no preventive care reminders to display for this patient.  Lab Results  Component Value Date   TSH 0.55 02/01/2017   Lab Results  Component Value Date   WBC 5.7 03/09/2020   HGB 12.7 03/09/2020   HCT 38.6 03/09/2020   MCV 94.5 03/09/2020   PLT 196.0 03/09/2020   Lab Results  Component Value Date   NA 138 03/09/2020   K 3.9 03/09/2020   CO2 31 03/09/2020   GLUCOSE 112 (H) 03/09/2020   BUN 24 (H) 03/09/2020   CREATININE 0.52 03/09/2020   BILITOT 0.3 03/09/2020   ALKPHOS 64 03/09/2020   AST 18 03/09/2020   ALT 18 03/09/2020   PROT 6.2 03/09/2020   ALBUMIN 4.2 03/09/2020   CALCIUM 9.3 03/09/2020   ANIONGAP 11 04/24/2019   GFR 116.27 03/09/2020   Lab Results  Component Value Date   CHOL 162 03/09/2020   Lab Results  Component Value Date   HDL 56.90 03/09/2020   Lab Results  Component Value Date   LDLCALC 77 03/09/2020   Lab Results  Component Value Date   TRIG 142.0 03/09/2020   Lab Results  Component Value Date   CHOLHDL 3 03/09/2020   Lab Results  Component Value Date   HGBA1C 6.2 (H) 04/27/2011      Assessment & Plan:   Problem List Items Addressed This Visit      Respiratory   Chronic obstructive pulmonary disease ([place]) - Primary   Relevant Medications   doxycycline (VIBRA-TABS) 100 MG tablet    predniSONE (DELTASONE) 10 MG tablet   fluconazole (DIFLUCAN) 150 MG tablet      Meds ordered this encounter  Medications  . doxycycline (VIBRA-TABS) 100 MG tablet    Sig: Take 1 tablet (100 mg total) by mouth 2 (two) times daily.    Dispense:  20 tablet    Refill:  0  . predniSONE (DELTASONE) 10 MG tablet    Sig: Take 1 tablet (10 mg total) by mouth 2 (two) times daily with a meal for 5 days.    Dispense:  10 tablet    Refill:  0  . fluconazole (DIFLUCAN) 150 MG tablet    Sig: Take 1 tablet (150 mg total) by mouth once for 1 dose.    Dispense:  1 tablet  Refill:  0    Follow-up: No follow-ups on file.  Advise patient to have a Covid test.  Seek emergent care for increasing difficulty breathing or shortness of breath.  Follow-up with Dr. Yong Channel next week.  Request treatment for post antibiotic yeast vaginitis.  She knows that she has to complete antibiotic therapy before starting.   Virtual Visit via Video Note  I connected with [name] on 04/22/20 at  2:30 PM EDT by a video enabled telemedicine application and verified that I am speaking with the correct person using two identifiers.  Location: Patient: home with fiance.  Provider:   I discussed the limitations of evaluation and management by telemedicine and the availability of in person appointments. The patient expressed understanding and agreed to proceed.  History of Present Illness:    Observations/Objective:   Assessment and Plan:   Follow Up Instructions:    I discussed the assessment and treatment plan with the patient. The patient was provided an opportunity to ask questions and all were answered. The patient agreed with the plan and demonstrated an understanding of the instructions.   The patient was advised to call back or seek an in-person evaluation if the symptoms worsen or if the condition fails to improve as anticipated.  I provided 20 minutes of non-face-to-face time during this  encounter.   Libby Maw, MD  Libby Maw, MD

## 2020-05-18 ENCOUNTER — Other Ambulatory Visit: Payer: Self-pay | Admitting: Family Medicine

## 2020-05-29 ENCOUNTER — Other Ambulatory Visit: Payer: Self-pay | Admitting: Family Medicine

## 2020-05-30 ENCOUNTER — Other Ambulatory Visit: Payer: Self-pay | Admitting: Family Medicine

## 2020-05-30 DIAGNOSIS — Z1231 Encounter for screening mammogram for malignant neoplasm of breast: Secondary | ICD-10-CM

## 2020-05-31 ENCOUNTER — Other Ambulatory Visit: Payer: Self-pay | Admitting: Family Medicine

## 2020-06-06 ENCOUNTER — Other Ambulatory Visit: Payer: Self-pay | Admitting: Family Medicine

## 2020-06-13 ENCOUNTER — Other Ambulatory Visit: Payer: Self-pay | Admitting: Family Medicine

## 2020-06-16 ENCOUNTER — Other Ambulatory Visit: Payer: Self-pay

## 2020-06-16 ENCOUNTER — Ambulatory Visit
Admission: RE | Admit: 2020-06-16 | Discharge: 2020-06-16 | Disposition: A | Discharge status: Home / Self Care | Payer: PPO | Source: Ambulatory Visit | Attending: Family Medicine | Admitting: Family Medicine

## 2020-06-16 DIAGNOSIS — Z1231 Encounter for screening mammogram for malignant neoplasm of breast: Secondary | ICD-10-CM | POA: Diagnosis not present

## 2020-06-20 ENCOUNTER — Other Ambulatory Visit: Payer: Self-pay | Admitting: Family Medicine

## 2020-06-23 NOTE — Progress Notes (Signed)
Cardiology Office Note:    Date:  06/24/2020   ID:  Miriya, Cloer 04/16/1951, MRN [id number]  PCP:  Marin Olp, MD  [facility] Cardiologist:  Sherren Mocha, MD   [place] Electrophysiologist:  None   Referring MD: Marin Olp, MD   Chief Complaint:  No chief complaint on file.    Patient Profile:    Katherine Foley is a 69 y.o. female with:   Coronary artery disease and Aortic Valve Dz s/p CABG/AVR  Coronary artery disease  ? NSTEMI in 2011 >> Tx with PCI to RCA  ? S/p PCI to RI in 2012 ? S/p CABG in 2012 (L-LAD) ? Cath in 9/19: RI and RCA stents ok; L-LAD atretic, mild disease in mLAD >> med Rx  Aortic valve Dz ? S/p bioprosthetic AVR in 2012  Asthma/COPD   Hypertension   Hyperlipidemia   L parotidectomy (path negative)  Carotid artery disease  [place] 8/21: Bilateral ICA 1-39; R subclavian stenosis  Prior CV studies: Carotid US 04/07/2020 Bilateral ICA 1-39; right subclavian stenosis  Cardiac Catheterization9/23/19 LAD mid 69 RI stent patent LCx mid 25 RCA ost 30, prox stent patent with 25 ISR, dist 25 LIMA known to be atretic   Echo 05/23/18 Mild conc LVH, EF 60-65, no RWMA, Gr 1 DD, s/p AVR without stenosis or significant regurgitation (peak 21), no RVSF, trivial MR, mild TR  Echo 12/26/16 EF 65-70, no RWMA, Gr 1 DD, AVR ok with mean 11, trivial TR  Cardiac Catheterization11/3/17 LM ok LAD mid 30 RI stent patent PCx prox 30 RCA mid stent patent with 30 ISR L-LAD atretic   Echo 12/02/15 EF 65-70, no RWMA, Gr 1 DD, normally functioning AVR (mean 16), trivial MR, trivial TR  Carotid US 12/02/15 1-39% bilateral ICA stenosis. FU as needed  Nuclear stress test3/31/17 EF 70.Normal pharmacologic nuclear stress test with no evidence of prior infarct or ischemia.  History of Present Illness:    Katherine Foley was last seen in October 2020.  She returns for annual follow-up.  She is here alone.  She has  been doing well without chest discomfort, significant shortness of breath, orthopnea, leg swelling or syncope.      Past Medical History:  Diagnosis Date  . Adenomatous colon polyp   . Anxiety   . ANXIETY DEPRESSION 02/15/2009  . AORTIC STENOSIS 07/07/2010   a. echo 12/23/10: EF 60-65%, mod to severe AS with mean gradient 29 mmHg;  b. 04/2011 s/p bioprosthetic AVR. // Echo 9/19:  Mild conc LVH, EF 60-65, no RWMA, Gr 1 DD, AVR ok (mean 10, peak 21), trivial MR, normal RVSF, mild TR   . Barrett's esophagus 04/14/2007  . Barrett's esophagus 2000  . CAD, NATIVE VESSEL 05/17/2010   a. NSTEMI 8/11 - BMS to RCA;  b. cath 4/12: dLAD 50%, RI 80% (PCI), AVCFX 30%, pRCA 30%, stent ok, 40-50, then 50% dist RCA;  c. s/p Promus DES to RI 12/25/10;  d. 04/2011 CABG x 1 (LIMA->LAD) @ time of AVR  . Carotid stenosis 05/17/2010   a. dopplers 09/32: RICA 67-12%; LICA 4-58% (follow up due 11/12)  . Chronic bronchitis ([place])    "multiple times" (03/25/2013)  . 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 Exertional shortness of breath    "related to COPD problems" (03/25/2013)  . Family history of breast cancer    2 daughters; PALB2 mutation in family  . Genetic testing 02/10/2018   PALB2 analysis at [facility] -  Negative for familial mutation  . GERD (gastroesophageal reflux disease)   . H/O hiatal hernia   . Hemorrhoid thrombosis    "had it lanced" (03/25/2013)  . Herpes simplex without mention of complication 10/15/9415  . History of blood transfusion    'w/OHS" (03/25/2013)  . [place], MILD 08/29/2009  . HYPERTENSION 04/14/2007  . MIGRAINE HEADACHE 04/20/2008  . Myocardial infarction [facility]) 2011   "during catherization" (03/25/2013)  . OSTEOPENIA 04/14/2007  . Ovarian cyst   . Parotid mass    left  . Pneumonia    "multiple times" (03/25/2013)  . URINARY INCONTINENCE, STRESS, MILD 08/26/2007  . Urine, incontinence, stress female     Current Medications: Current Meds  Medication Sig  .  Albuterol Sulfate (PROAIR RESPICLICK) 408 (90 Base) MCG/ACT AEPB Inhale 1 puff into the lungs every 6 (six) hours as needed (wheezing or shortness of breath).  Marland Kitchen amLODipine (NORVASC) 5 MG tablet Take 1 tablet (5 mg total) by mouth daily.  Marland Kitchen aspirin EC 81 MG tablet Take 1 tablet (81 mg total) by mouth daily.  . cetirizine (ZYRTEC) 10 MG tablet Take 10 mg by mouth daily as needed for allergies.   . COLLODION FLEXIBLE EX Apply topically.  Marland Kitchen desvenlafaxine (PRISTIQ) 100 MG 24 hr tablet Take 1 tablet by mouth once daily  . DEXILANT 60 MG capsule Take 1 capsule by mouth once daily  . fluticasone (FLONASE) 50 MCG/ACT nasal spray Use 2 spray(s) in each nostril once daily  . gabapentin (NEURONTIN) 300 MG capsule Take 1 capsule by mouth once daily at bedtime  . LORazepam (ATIVAN) 0.5 MG tablet TAKE 1 TABLET BY MOUTH TWICE DAILY AS NEEDED FOR ANXIETY. DON'T DRIVE 8 HOURS AFTER USING  . Multiple Vitamins-Minerals (MULTIVITAMIN ADULT PO) Take 1 tablet by mouth daily.   . nitroGLYCERIN (NITROSTAT) 0.4 MG SL tablet Place 1 tablet (0.4 mg total) under the tongue every 5 (five) minutes as needed for chest pain (if pain does not resolve with 2 doses, call 911).  . propranolol (INDERAL) 20 MG tablet Take 1 tablet by mouth twice daily  . rosuvastatin (CRESTOR) 40 MG tablet Take 1 tablet (40 mg total) by mouth daily.  . sertraline (ZOLOFT) 50 MG tablet Take 1 tablet (50 mg total) by mouth daily.  Marland Kitchen SPIRIVA HANDIHALER 18 MCG inhalation capsule Inhale 2 puffs by mouth twice daily  . SYMBICORT 160-4.5 MCG/ACT inhaler Inhale 2 puffs by mouth twice daily     Allergies:   Codeine sulfate and Hydrocodone-acetaminophen   Social History   Tobacco Use  . Smoking status: Former Smoker    Packs/day: 1.50    Years: 44.00    Pack years: 66.00    Types: Cigarettes    Quit date: 09/03/2005    Years since quitting: 14.8  . Smokeless tobacco: Never Used  Vaping Use  . Vaping Use: Never used  Substance Use Topics  .  Alcohol use: Yes    Alcohol/week: 0.0 standard drinks    Comment: social  . Drug use: No     Family Hx: The patient's family history includes Asthma in her son; Breast cancer in her daughter; Breast cancer (age of onset: 80) in her daughter; COPD in her father; Cancer in an other family member; Cirrhosis in her sister; Coronary artery disease in an other family member; Heart disease in her father, maternal uncle, mother, and another family member; Rheum arthritis in her father; Stroke in her brother. There is no history of Colon cancer or Colon  polyps.  Review of Systems  Cardiovascular: Negative for claudication.     EKGs/Labs/Other Test Reviewed:    EKG:  EKG is  ordered today.  The ekg ordered today demonstrates sinus bradycardia, HR 58, normal axis, nonspecific ST-T wave changes, QTC 451  Recent Labs: 03/09/2020: ALT 18; BUN 24; Creatinine, Ser 0.52; Hemoglobin 12.7; Platelets 196.0; Potassium 3.9; Sodium 138   Recent Lipid Panel Lab Results  Component Value Date/Time   CHOL 162 03/09/2020 11:51 AM   CHOL 163 07/01/2019 10:19 AM   TRIG 142.0 03/09/2020 11:51 AM   HDL 56.90 03/09/2020 11:51 AM   HDL 70 07/01/2019 10:19 AM   CHOLHDL 3 03/09/2020 11:51 AM   LDLCALC 77 03/09/2020 11:51 AM   LDLCALC 68 07/01/2019 10:19 AM   LDLDIRECT 65.0 06/19/2017 09:01 AM    Risk Assessment/Calculations:      Physical Exam:    VS:  BP 122/74   Pulse (!) 58   Ht 5' 2.5" (1.588 m)   Wt 127 lb 9.6 oz (57.9 kg)   SpO2 94%   BMI 22.97 kg/m     Wt Readings from Last 3 Encounters:  06/24/20 127 lb 9.6 oz (57.9 kg)  04/22/20 127 lb (57.6 kg)  03/09/20 129 lb (58.5 kg)     Constitutional:      Appearance: Healthy appearance. Not in distress.  Neck:     Vascular: JVD normal.  Pulmonary:     Effort: Pulmonary effort is normal.     Breath sounds: No wheezing. No rales.  Cardiovascular:     Normal rate. Regular rhythm. Normal S1. Normal S2.     Murmurs: There is a grade 1/6 early  systolic murmur at the URSB.  Pulses:    Intact distal pulses.  Edema:    Peripheral edema absent.  Abdominal:     Palpations: Abdomen is soft.  Skin:    General: Skin is warm and dry.  Neurological:     Mental Status: Alert and oriented to person, place and time.     Cranial Nerves: Cranial nerves are intact.      ASSESSMENT & PLAN:    1. Coronary artery disease involving native coronary artery of native heart without angina pectoris Status post prior PCI to the RCA and RI and subsequent CABG in 2012.  Cardiac catheterization in 2019 demonstrated patent stents in the RCA and RI, atretic LIMA-LAD and mild nonobstructive disease in the LAD.  She is doing well without anginal symptoms.  Continue aspirin, rosuvastatin.  Follow-up 1 year.  2. Aortic valve disease 3. S/P AVR (aortic valve replacement) Echocardiogram in September 2019 with normally functioning aortic valve prosthesis.  She has had no symptoms to suggest prosthetic stenosis or regurgitation.  Continue SBE prophylaxis.  Obtain follow-up echocardiogram in 1 year prior to next visit.  4. Essential hypertension The patient's blood pressure is controlled on her current regimen.  Continue current therapy.   5. Hyperlipidemia LDL goal <70 Recent LDL 77.  Her rosuvastatin was increased to 40 mg daily.  Arrange fasting lipids, LFTs.  We discussed a goal LDL <70.  6. Stenosis of right subclavian artery ([place]) Noted on recent carotid ultrasound.  She has no symptoms of subclavian steal.     Dispo:  No follow-ups on file.   Medication Adjustments/Labs and Tests Ordered: Current medicines are reviewed at length with the patient today.  Concerns regarding medicines are outlined above.  Tests Ordered: Orders Placed This Encounter  Procedures  . Hepatic function panel  .  Lipid panel  . EKG 12-Lead  . ECHOCARDIOGRAM COMPLETE   Medication Changes: No orders of the defined types were placed in this encounter.   Signed, Richardson Dopp, PA-C  06/24/2020 11:35 AM    [facility] [address], [place], [place]  [address] Phone: [phone]; Fax: [phone]

## 2020-06-24 ENCOUNTER — Ambulatory Visit: Payer: PPO | Admitting: Physician Assistant

## 2020-06-24 ENCOUNTER — Encounter: Payer: Self-pay | Admitting: Physician Assistant

## 2020-06-24 ENCOUNTER — Other Ambulatory Visit: Payer: Self-pay

## 2020-06-24 VITALS — BP 122/74 | HR 58 | Ht 62.5 in | Wt 127.6 lb

## 2020-06-24 DIAGNOSIS — I10 Essential (primary) hypertension: Secondary | ICD-10-CM | POA: Diagnosis not present

## 2020-06-24 DIAGNOSIS — I771 Stricture of artery: Secondary | ICD-10-CM

## 2020-06-24 DIAGNOSIS — I359 Nonrheumatic aortic valve disorder, unspecified: Secondary | ICD-10-CM | POA: Diagnosis not present

## 2020-06-24 DIAGNOSIS — Z952 Presence of prosthetic heart valve: Secondary | ICD-10-CM | POA: Diagnosis not present

## 2020-06-24 DIAGNOSIS — E785 Hyperlipidemia, unspecified: Secondary | ICD-10-CM | POA: Diagnosis not present

## 2020-06-24 DIAGNOSIS — I2510 Atherosclerotic heart disease of native coronary artery without angina pectoris: Secondary | ICD-10-CM

## 2020-06-24 DIAGNOSIS — I6523 Occlusion and stenosis of bilateral carotid arteries: Secondary | ICD-10-CM

## 2020-06-24 NOTE — Patient Instructions (Addendum)
Medication Instructions:  Your physician recommends that you continue on your current medications as directed. Please refer to the Current Medication list given to you today.  *If you need a refill on your cardiac medications before your next appointment, please call your pharmacy*  Lab Work: Your physician recommends that you return for lab work in: 3 months on 09/26/2020 **The lab is open from 7:30AM-4:30PM** You may come anytime between these hours. Come fasting your cholesterol will be checked.   Testing/Procedures: Your physician has requested that you have an echocardiogram. Echocardiography is a painless test that uses sound waves to create images of your heart. It provides your doctor with information about the size and shape of your heart and how well your heart's chambers and valves are working. This procedure takes approximately one hour. There are no restrictions for this procedure.  Follow-Up: At [facility], you and your health needs are our priority.  As part of our continuing mission to provide you with exceptional heart care, we have created designated Provider Care Teams.  These Care Teams include your primary Cardiologist (physician) and Advanced Practice Providers (APPs -  Physician Assistants and Nurse Practitioners) who all work together to provide you with the care you need, when you need it.  Your next appointment:   12 month(s) after echocardiogram  The format for your next appointment:   In Person  Provider:   You may see Sherren Mocha, MD or Richardson Dopp, PA-C

## 2020-06-29 ENCOUNTER — Other Ambulatory Visit: Payer: Self-pay | Admitting: Family Medicine

## 2020-07-19 ENCOUNTER — Other Ambulatory Visit: Payer: Self-pay | Admitting: Family Medicine

## 2020-08-17 ENCOUNTER — Telehealth: Payer: Self-pay | Admitting: Family Medicine

## 2020-08-17 NOTE — Chronic Care Management (AMB) (Signed)
  Chronic Care Management   Note  08/17/2020 Name: Katherine Foley MRN: [id number] DOB: 07/02/1951  Katherine Foley is a 69 y.o. year old female who is a primary care patient of Marin Olp, MD. I reached out to [name] by phone today in response to a referral sent by Katherine Foley's PCP, Yong Channel Brayton Mars, MD.   Katherine Foley was given information about Chronic Care Management services today including:  1. CCM service includes personalized support from designated clinical staff supervised by her physician, including individualized plan of care and coordination with other care providers 2. 24/7 contact phone numbers for assistance for urgent and routine care needs. 3. Service will only be billed when office clinical staff spend 20 minutes or more in a month to coordinate care. 4. Only one practitioner may furnish and bill the service in a calendar month. 5. The patient may stop CCM services at any time (effective at the end of the month) by phone call to the office staff.   Patient agreed to services and verbal consent obtained.   Follow up plan:   Lauretta Grill Upstream Scheduler

## 2020-08-17 NOTE — Progress Notes (Signed)
°  Chronic Care Management   Outreach Note  08/17/2020 Name: Katherine Foley MRN: [id number] DOB: 1951/05/13  Referred by: Marin Olp, MD Reason for referral : No chief complaint on file.   An unsuccessful telephone outreach was attempted today. The patient was referred to the pharmacist for assistance with care management and care coordination.   Follow Up Plan:   Lauretta Grill Upstream Scheduler

## 2020-08-18 ENCOUNTER — Other Ambulatory Visit: Payer: Self-pay | Admitting: Family Medicine

## 2020-08-30 ENCOUNTER — Other Ambulatory Visit: Payer: Self-pay | Admitting: Family Medicine

## 2020-09-05 ENCOUNTER — Other Ambulatory Visit: Payer: Self-pay | Admitting: Family Medicine

## 2020-09-12 ENCOUNTER — Telehealth: Payer: Self-pay

## 2020-09-12 NOTE — Chronic Care Management (AMB) (Signed)
Chronic Care Management Pharmacy Assistant   Name: Katherine Foley  MRN: [id number] DOB: Jan 01, 1951  Reason for Encounter: Medication Review / Initial Call   Katherine Foley,  70 y.o. , female presents for their Initial CCM visit with the clinical pharmacist In office.  PCP : Marin Olp, MD  Allergies:   Allergies  Allergen Reactions  . Codeine Sulfate Itching and Rash  . Hydrocodone-Acetaminophen Itching and Rash    Medications: Outpatient Encounter Medications as of 09/12/2020  Medication Sig  . Albuterol Sulfate (PROAIR RESPICLICK) 378 (90 Base) MCG/ACT AEPB Inhale 1 puff into the lungs every 6 (six) hours as needed (wheezing or shortness of breath).  Marland Kitchen amLODipine (NORVASC) 5 MG tablet Take 1 tablet (5 mg total) by mouth daily.  Marland Kitchen aspirin EC 81 MG tablet Take 1 tablet (81 mg total) by mouth daily.  . cetirizine (ZYRTEC) 10 MG tablet Take 10 mg by mouth daily as needed for allergies.   . COLLODION FLEXIBLE EX Apply topically.  Marland Kitchen desvenlafaxine (PRISTIQ) 100 MG 24 hr tablet Take 1 tablet by mouth once daily  . DEXILANT 60 MG capsule Take 1 capsule by mouth once daily  . fluticasone (FLONASE) 50 MCG/ACT nasal spray Use 2 spray(s) in each nostril once daily  . gabapentin (NEURONTIN) 300 MG capsule Take 1 capsule by mouth once daily at bedtime  . LORazepam (ATIVAN) 0.5 MG tablet TAKE 1 TABLET BY MOUTH TWICE DAILY AS NEEDED FOR ANXIETY. DON'T DRIVE 8 HOURS AFTER USING  . Multiple Vitamins-Minerals (MULTIVITAMIN ADULT PO) Take 1 tablet by mouth daily.   . nitroGLYCERIN (NITROSTAT) 0.4 MG SL tablet Place 1 tablet (0.4 mg total) under the tongue every 5 (five) minutes as needed for chest pain (if pain does not resolve with 2 doses, call 911).  . propranolol (INDERAL) 20 MG tablet Take 1 tablet by mouth twice daily  . rosuvastatin (CRESTOR) 40 MG tablet Take 1 tablet by mouth once daily  . sertraline (ZOLOFT) 50 MG tablet Take 1 tablet (50 mg total) by mouth daily.  Marland Kitchen  SPIRIVA HANDIHALER 18 MCG inhalation capsule Inhale 2 puffs by mouth twice daily  . SYMBICORT 160-4.5 MCG/ACT inhaler Inhale 2 puffs by mouth twice daily  . [DISCONTINUED] Aclidinium Bromide (TUDORZA PRESSAIR) 400 MCG/ACT AEPB Inhale 1 puff into the lungs 2 (two) times daily.   No facility-administered encounter medications on file as of 09/12/2020.    Current Diagnosis: Patient Active Problem List   Diagnosis Date Noted  . Aortic atherosclerosis ([place]) 03/10/2020  . Restless legs 08/25/2019  . H/O parotidectomy 04/28/2019  . Genetic testing 02/10/2018  . Family history of breast cancer   . 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 Hot flashes 03/22/2016  . Allergic rhinitis 09/22/2015  . Depression 09/22/2015  . Former smoker 09/22/2015  . Adenomatous colon polyp   . Diastolic dysfunction 58/85/0277  . GERD (gastroesophageal reflux disease) 06/04/2014  . S/P AVR (aortic valve replacement) 11/06/2013  . Aortic stenosis 07/07/2010  . CAD (coronary artery disease) 05/17/2010  . Carotid stenosis 05/17/2010  . Herpes simplex virus (HSV) infection 01/16/2010  . HLD (hyperlipidemia) 08/29/2009  . Chronic obstructive pulmonary disease ([place]) 07/23/2007  . Essential hypertension 04/14/2007  . Osteopenia 04/14/2007    Have you seen any other providers since your last visit?   Patient stated she has seen her cardiologist .   Any changes in your medications or health?   Patient stated her cholseterol levels were high last visit at  cardiologist and next visit will be getting blood work  due to being high at last visit  Any side effects from any medications?  Patient stated no side effects from any medications.  Do you have an symptoms or problems not managed by your medications?   Patient stated no symptoms or problems not managed by medications.  Any concerns about your health right now?   Patient stated no concerns about health at this time  Has your provider asked that you check blood  pressure, blood sugar, or follow special diet at home?  Patient stated she does check blood pressure when she feels like it is up a little . Patient stated she does not follow a diet however she does try to eat healthier.  Do you get any type of exercise on a regular basis?       Patient stated she does exercise. Patient states she walks at least a mile a day .   Can you think of a goal you would like to reach for your health?         Patient stated she would like to continue to lose weight and manage weight.  Do you have any problems getting your medications?      Patient stated she has no problems getting medications. However she states it sometimes may take a little longer , she states it  seems like everything's always getting called for a refill .   Is there anything that you would like to discuss during the appointment?         Patient would like to discuss  Refills/ insurance questions.  Please bring medications and supplements to appointment  Georgiana Shore ,[place] Pharmacist Assistant [phone]  Patient would like to change this visit to an over the phone visit.   Follow-Up:  Pharmacist Review

## 2020-09-14 ENCOUNTER — Telehealth: Payer: Self-pay

## 2020-09-14 ENCOUNTER — Ambulatory Visit: Payer: PPO

## 2020-09-14 DIAGNOSIS — K219 Gastro-esophageal reflux disease without esophagitis: Secondary | ICD-10-CM

## 2020-09-14 DIAGNOSIS — F3341 Major depressive disorder, recurrent, in partial remission: Secondary | ICD-10-CM

## 2020-09-14 DIAGNOSIS — I10 Essential (primary) hypertension: Secondary | ICD-10-CM

## 2020-09-14 DIAGNOSIS — J449 Chronic obstructive pulmonary disease, unspecified: Secondary | ICD-10-CM

## 2020-09-14 DIAGNOSIS — E785 Hyperlipidemia, unspecified: Secondary | ICD-10-CM

## 2020-09-14 MED ORDER — GABAPENTIN 300 MG PO CAPS: ORAL_CAPSULE | ORAL | 3 refills | Status: DC

## 2020-09-14 MED ORDER — DESVENLAFAXINE SUCCINATE ER 100 MG PO TB24: 100.0000 mg | ORAL_TABLET | Freq: Every day | ORAL | 2 refills | Status: DC

## 2020-09-14 NOTE — Progress Notes (Signed)
Chronic Care Management Pharmacy Name: Den[facility]tz     MRN: [id number]     DOB: 06-02-1951  Chief Complaint/ HPI Katherine Foley, 70 y.o., female, presents for their initial CCM visit with the clinical pharmacist in office.  PCP: Marin Olp, MD Encounter Diagnoses  Name Primary?  . Essential hypertension Yes  . Hyperlipidemia, unspecified hyperlipidemia type   . Recurrent major depressive disorder, in partial remission ([place])   . Chronic obstructive pulmonary disease, unspecified COPD type ([place])     Office Visits:  03/09/2020 (PCP): "Your cholesterol is mildly elevated with bad cholesterol at 77. Team please increase rosuvastatin to 45m daily #90 with 3 refills" CBC normal, mild dehydration noted on CMET, iron ok, b12 normal.  Patient Active Problem List   Diagnosis Date Noted  . Aortic atherosclerosis ([place] 03/10/2020  . Restless legs 08/25/2019  . H/O parotidectomy 04/28/2019  . Genetic testing 02/10/2018  . Family history of breast cancer   . 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 Hot flashes 03/22/2016  . Allergic rhinitis 09/22/2015  . Depression 09/22/2015  . Former smoker 09/22/2015  . Adenomatous colon polyp   . Diastolic dysfunction 057/97/2820 . GERD (gastroesophageal reflux disease) 06/04/2014  . S/P AVR (aortic valve replacement) 11/06/2013  . Aortic stenosis 07/07/2010  . CAD (coronary artery disease) 05/17/2010  . Carotid stenosis 05/17/2010  . Herpes simplex virus (HSV) infection 01/16/2010  . HLD (hyperlipidemia) 08/29/2009  . Chronic obstructive pulmonary disease ([place] 07/23/2007  . Essential hypertension 04/14/2007  . Osteopenia 04/14/2007   Past Surgical History:  Procedure Laterality Date  . AORTIC VALVE REPLACEMENT  2012  . APPENDECTOMY    . BLEPHAROPLASTY Bilateral ~ 1998  . CARDIAC CATHETERIZATION N/A 07/06/2016   Procedure: Left Heart Cath and Cors/Grafts Angiography;  Surgeon: MSherren Mocha MD;  [facility];   Service: Cardiovascular;  Laterality: N/A;  . CHOLECYSTECTOMY N/A 03/25/2013   Procedure: LAPAROSCOPIC CHOLECYSTECTOMY WITH INTRAOPERATIVE CHOLANGIOGRAM;  Surgeon: MImogene Burn TGeorgette Dover MD;  [facility] [facility]  Service: General;  Laterality: N/A;  . CORONARY ANGIOGRAPHY N/A 05/26/2018   Procedure: CORONARY ANGIOGRAPHY (CATH LAB);  Surgeon: VJettie Booze MD;  [facility];  Service: Cardiovascular;  Laterality: N/A;  . CORONARY ANGIOPLASTY WITH STENT PLACEMENT  2010   "I have 2 stents" (03/25/2013)  . NASAL SINUS SURGERY  1997; 1998   "cust in maxillary sinus; put a stent in then removed it" (03/25/2013)  . OVARIAN CYST REMOVAL Right 2011   "size of a soccer ball" (03/25/2013)  . PAROTIDECTOMY Left 04/28/2019   Procedure: LEFT TOTAL PAROTIDECTOMY;  Surgeon: TLeta Baptist MD;  [facility]  Service: ENT;  Laterality: Left;  . thrombosed hemorrohid lanced    . TONSILLECTOMY AND ADENOIDECTOMY    . TOTAL ABDOMINAL HYSTERECTOMY     Family History  Problem Relation Age of Onset  . Coronary artery disease Other   . Heart disease Other        6 stents  . Heart disease Mother   . COPD Father        smoked  . Heart disease Father   . Rheum arthritis Father   . Heart disease Maternal Uncle   . Breast cancer Daughter        currently 47 . Asthma Son        as a child   . Breast cancer Daughter 512      currently 54 PALB2 mutation  .  Cancer Other        very distant paternal cousin; pancreatic ca  . Cirrhosis Sister   . Stroke Brother   . Colon cancer Neg Hx   . Colon polyps Neg Hx    Social History   Social History Narrative   Prolonged engagement 17 years in 2017. 3 children. 6 grandkids. 2 greatgrandkids. Keeps greatgrandson on tuesdays- loves her so much.       Retired from [employer] (parttime- Dec 12)   Faith is very important to her      Hobbies: reading, cross stich, sew. Wants to learn to knit.       Allergies  Allergen Reactions  .  Codeine Sulfate Itching and Rash  . Hydrocodone-Acetaminophen Itching and Rash   Outpatient Encounter Medications as of 09/14/2020  Medication Sig  . amLODipine (NORVASC) 5 MG tablet Take 1 tablet (5 mg total) by mouth daily.  Marland Kitchen aspirin EC 81 MG tablet Take 1 tablet (81 mg total) by mouth daily.  . cetirizine (ZYRTEC) 10 MG tablet Take 10 mg by mouth at bedtime.  Marland Kitchen DEXILANT 60 MG capsule Take 1 capsule by mouth once daily  . propranolol (INDERAL) 20 MG tablet Take 1 tablet by mouth twice daily  . rosuvastatin (CRESTOR) 40 MG tablet Take 1 tablet by mouth once daily  . sertraline (ZOLOFT) 50 MG tablet Take 1 tablet (50 mg total) by mouth daily.  Marland Kitchen SPIRIVA HANDIHALER 18 MCG inhalation capsule Inhale 2 puffs by mouth twice daily  . SYMBICORT 160-4.5 MCG/ACT inhaler Inhale 2 puffs by mouth twice daily  . [DISCONTINUED] desvenlafaxine (PRISTIQ) 100 MG 24 hr tablet Take 1 tablet by mouth once daily  . [DISCONTINUED] gabapentin (NEURONTIN) 300 MG capsule Take 1 capsule by mouth once daily at bedtime  . Albuterol Sulfate (PROAIR RESPICLICK) 546 (90 Base) MCG/ACT AEPB Inhale 1 puff into the lungs every 6 (six) hours as needed (wheezing or shortness of breath).  . COLLODION FLEXIBLE EX Apply topically.  . fluticasone (FLONASE) 50 MCG/ACT nasal spray Use 2 spray(s) in each nostril once daily  . LORazepam (ATIVAN) 0.5 MG tablet TAKE 1 TABLET BY MOUTH TWICE DAILY AS NEEDED FOR ANXIETY. DON'T DRIVE 8 HOURS AFTER USING  . Multiple Vitamins-Minerals (MULTIVITAMIN ADULT PO) Take 1 tablet by mouth daily.   . nitroGLYCERIN (NITROSTAT) 0.4 MG SL tablet Place 1 tablet (0.4 mg total) under the tongue every 5 (five) minutes as needed for chest pain (if pain does not resolve with 2 doses, call 911).  . [DISCONTINUED] Aclidinium Bromide (TUDORZA PRESSAIR) 400 MCG/ACT AEPB Inhale 1 puff into the lungs 2 (two) times daily.   No facility-administered encounter medications on file as of 09/14/2020.   Patient Care Team     Relationship Specialty Notifications Start End  Marin Olp, MD PCP - General Family Medicine  09/12/15   Sherren Mocha, MD PCP - Cardiology Cardiology Admissions 05/21/18   Bensimhon, Shaune Pascal, MD  Cardiology  04/18/11   Tanda Rockers, MD Consulting Physician Pulmonary Disease  06/17/15   Jamesetta Geralds, [place]  Chiropractic Medicine  03/29/17   Leta Baptist, MD Consulting Physician Otolaryngology  02/03/19   Madelin Rear, [facility]  Pharmacist  08/17/20   Madelin Rear, [facility] Pharmacist Pharmacist  08/17/20    Current Diagnosis/Assessment: Goals Addressed            This Visit's Progress   . PharmD f/u telephone visit       [place] (see longitudinal plan of care  for additional care plan information)  Current Barriers:  . Chronic Disease Management support, education, and care coordination needs related to Hypertension, Hyperlipidemia, Diabetes, and COPD   Hypertension BP Readings from Last 3 Encounters:  06/24/20 122/74  03/09/20 118/62  11/09/19 120/72   . Pharmacist Clinical Goal(s): o Over the next 365 days, patient will work with PharmD and providers to maintain BP goal <130/80 . Current regimen:  . Amlodipine 5 mg once daily . Propanolol 20 mg twice daily  . Interventions: o We discussed diet/exercise - Maintain a healthy weight and exercise regularly, as directed by your health care provider. Eat healthy foods, such as: Lean proteins, complex carbohydrates, fresh fruits and vegetables, low-fat dairy products, healthy fats. . Patient self care activities - Over the next 365 days, patient will: o Check BP at least once every 1-2 weeks, document, and provide at future appointments o Ensure daily salt intake < 2300 mg/day  Hyperlipidemia Lab Results  Component Value Date/Time   LDLCALC 77 03/09/2020 11:51 AM   LDLCALC 68 07/01/2019 10:19 AM   LDLDIRECT 65.0 06/19/2017 09:01 AM   . Pharmacist Clinical Goal(s): o Over the next 180 days, patient will work with PharmD and  providers to achieve LDL goal < 70 . Current regimen:  o Rosuvastatin 40 mg once daily . Interventions: o Reviewed side effects and cholesterol goals - no side effects noted. . Patient self care activities - Over the next 180 days, patient will: o Continue with diet/exercise o Reach out with any questions Depression . Pharmacist Clinical Goal(s) o Over the next 365 days, patient will work with PharmD and providers to ensure medication safety and minimize symptoms of depression. . Current regimen:  o Sertraline 50 mg once daily o Desvenlafaxine 100 mg once daily . Interventions: o Reviewed side effects, ongoing benefit of current regimen - no side effects or problems noted . Patient self care activities - Over the next 365 days, patient will: o Continue current management o Contact [facility] if wanting to reconsider appointment  COPD . Pharmacist Clinical Goal(s) o Over the next 90 days, patient will work with PharmD and providers to ensure medication safety and affordability . Current regimen:  . Spiriva handihaler 18 mcg capsule - 2 puffs twice daily  . Symbicort 160-4.5 mcg once daily - 2 puffs twice daily . Albuterol sulfate (Proair Respiclick) - 1 puff every 6 hours as needed . Interventions: o Reviewed patient assistance programs - will pursue for spiriva and symbicort. . Patient self care activities - Over the next 90 days, patient will: o Complete patient section of patient assistance application, return to [place] once complete.  Medication management . Pharmacist Clinical Goal(s): o Over the next 365 days, patient will work with PharmD and providers to achieve optimal medication adherence . Current pharmacy: [facility] . Interventions o Comprehensive medication review performed. o Continue current medication management strategy . Patient self care activities - Over the next 365 days, patient will: o Focus on medication  adherence by taking medications as prescribed o Report any questions or concerns to PharmD and/or provider(s) Initial goal documentation.      Hypertension   BP goal <130/80  BP Readings from Last 3 Encounters:  06/24/20 122/74  03/09/20 118/62  11/09/19 120/72    BMP Latest Ref Rng & Units 03/09/2020 11/09/2019 11/02/2019  Glucose 70 - 99 mg/dL 112(H) 100(H) 95  BUN 6 - 23 mg/dL 24(H) 15 16  Creatinine 0.40 -  1.20 mg/dL 0.52 0.62 0.67  BUN/Creat Ratio 12 - 28 - - -  Sodium 135 - 145 mEq/L 138 132(L) 135  Potassium 3.5 - 5.1 mEq/L 3.9 4.6 4.1  Chloride 96 - 112 mEq/L 100 95(L) 97  CO2 19 - 32 mEq/L _0 Calcium 8.4 - 10.5 mg/dL 9.3 9.4 9.4   Previous medications: HCTZ (Hyponatremia).  Walking dog 1-2 miles per day. No sweets junk foods or soda. Breakfast: half cup grape nut cereal, unsweatened vanilla, blueberry, black coffee. Protein shakes with almond milk and black coffee.  Patient checks BP at home several times per month. Recent home readings:  Patient is currently at goal on the following medications:  . Amlodipine 5 mg once daily . Propanolol 20 mg twice daily   We discussed diet/exercise - Maintain a healthy weight and exercise regularly, as directed by your health care provider. Eat healthy foods, such as: Lean proteins, complex carbohydrates, fresh fruits and vegetables, low-fat dairy products, healthy fats.  Plan  Continue current medications.  Hyperlipidemia   LDL goal < 70  Lipid Panel     Component Value Date/Time   CHOL 162 03/09/2020 1151   CHOL 163 07/01/2019 1019   CHOL 194 03/20/2017 0822   CHOL 157 12/09/2014 1109   TRIG 142.0 03/09/2020 1151   TRIG 146 07/01/2019 1019   TRIG 138.0 03/20/2017 0822   HDL 56.90 03/09/2020 1151   HDL 70 07/01/2019 1019   HDL 73.70 03/20/2017 0822   HDL 65.00 12/09/2014 1109   LDLCALC 77 03/09/2020 1151   LDLCALC 68 07/01/2019 1019   LDLCALC 92 03/20/2017 0822   LDLCALC 77 12/09/2014 1109   LDLDIRECT 65.0  06/19/2017 0901   LDLDIRECT 73.0 05/08/2017 0738   LDLDIRECT 64.0 12/21/2015 1153    Hepatic Function Latest Ref Rng & Units 03/09/2020 11/09/2019 11/02/2019  Total Protein 6.0 - 8.3 g/dL 6.2 6.2 6.3  Albumin 3.5 - 5.2 g/dL 4.2 4.1 4.1  AST 0 - 37 U/L _1 ALT 0 - 35 U/L _2 Alk Phosphatase 39 - 117 U/L 64 57 57  Total Bilirubin 0.2 - 1.2 mg/dL 0.3 0.4 0.4  Bilirubin, Direct 0.0 - 0.3 mg/dL - - -    The ASCVD Risk score (Motley., et al., 2013) failed to calculate for the following reasons:   The patient has a prior MI or stroke diagnosis   Previous medications: Vytorin, lovastatin. Patient is currently near goal on following medications:  . Rosuvastatin 40 mg once daily  CAD, secondary prevention. Denies any abnormal bruising, bleeding from nose or gums or blood in urine or stool. Patient is currently controlled on the following medications:  . Aspirin 81 mg once daily  Reviewed side effects and cholesterol goals - no side effects noted.  Plan  Continue current medications.  COPD   Feels breathing has improved after losing weight.  Using maintenance inhaler regularly? Yes. Frequency of rescue inhaler use:  infrequently.  Former smoker 60+ pack yr hx. Patient is currently on the following medications:  . Spiriva handihaler 18 mcg capsule - 2 puffs twice daily  . Symbicort 160-4.5 mcg once daily - 2 puffs twice daily . Albuterol sulfate (Proair Respiclick) - 1 puff every 6 hours prn  Reviewed patient assistance programs - will pursue for spiriva and symbicort.  Plan  Continue current medications.  spiriva and symbicort pap  GERD   Lab Results  Component Value Date   [id number] 527 03/09/2020  Denies recent acid reflux. Identifies chocolate as main trigger. Currently controlled on: Marland Kitchen Dexilant 60 mg once daily  We discussed: Avoidance of potential triggers such as alcohol, fatty foods, lying down after eating, and tomato sauce.  Plan   Continue  current medications.  Depression   PHQ9 SCORE ONLY 04/21/2020 02/17/2020 11/09/2019  PHQ-9 Total Score _0 Mourning the loss of oldest daughter who passed October 2020 from breast cancer. Reports ongoing benefit of taking medication, counseling/group therapy has not been very helpful.  Denies side effects. Patient is currently on the following medications:  . Pristiq 100 mg once daily . Zoloft 50 mg once daily  Also has alprazolam Rx used occasionally over past ~1 yr.  Plan  Continue current medications.  Vaccines   Immunization History  Administered Date(s) Administered  . Fluad Quad(high Dose 65+) 07/14/2020  . Influenza Split 06/19/2011, 07/02/2012  . Influenza Whole 08/03/2009  . Influenza, High Dose Seasonal PF 05/20/2015, 06/04/2019, 06/04/2019  . Influenza,inj,Quad PF,6+ Mos 05/21/2013, 05/14/2014, 06/17/2018  . Influenza-Unspecified 08/17/2016, 06/19/2017, 06/17/2018  . PFIZER SARS-COV-2 Vaccination 10/20/2019, 11/09/2019, 06/30/2020  . Pneumococcal Conjugate-13 06/14/2014  . Pneumococcal Polysaccharide-23 03/22/2016  . Td 08/29/2009, 03/17/2020  . Zoster 01/21/2016  . Zoster Recombinat (Shingrix) 03/10/2018, 07/15/2018   Reviewed and discussed patient's vaccination history.  Plan  No recommendations.  Medication Management / Care Coordination   Receives prescription medications from:  [facility] - [place], [place] - [address] [place] [address] Phone: [phone] Fax: [phone]   Some concerns with getting medications refilled on time/running out of medications. Discussed [facility] and expectations including medication synchronization, delivery.  Plan  Utilize [facility] for medication synchronization, packaging and delivery. ___________________________ SDOH (Social Determinants of Health) assessments performed: Yes. Future Appointments   Date Time Provider [place]  09/26/2020  7:45 AM [facility]  12/05/2020 10:30 AM [facility] CCM PHARMACIST [facility]  04/27/2021  9:30 AM [facility] HEALTH COACH [facility]   Visit follow-up:  . CPA follow-up: med synch, onboarding completion. Marland Kitchen RPH follow-up: 3 month telephone visit.  Madelin Rear, Pharm.D., BCGP Clinical Pharmacist [facility] [phone]

## 2020-09-14 NOTE — Progress Notes (Signed)
[facility] on boarding form for [facility] completed and in CPP folder to review .  [place] for a profile transfer to [facility] . Per [facility] patient has no refills on any medciations. CPP to request [facility] to [facility].  Georgiana Shore ,[place] Pharmacist Assistant [phone]

## 2020-09-14 NOTE — Telephone Encounter (Signed)
Rx sent to [facility]. 

## 2020-09-15 ENCOUNTER — Telehealth: Payer: Self-pay

## 2020-09-15 ENCOUNTER — Other Ambulatory Visit: Payer: Self-pay | Admitting: Family Medicine

## 2020-09-15 NOTE — Patient Instructions (Addendum)
[name],  Thank you for taking the time to review your medications with me today.  I have included our care plan/goals in the following pages. Please call me at [phone] with any questions or if you have not received the patient assistance applications within next 2 weeks.  Thanks! Ellin Mayhew, Pharm.D., BCGP Clinical Pharmacist [facility] [phone]   Goals Addressed            This Visit's Progress   . PharmD f/u telephone visit       [place] (see longitudinal plan of care for additional care plan information)  Current Barriers:  . Chronic Disease Management support, education, and care coordination needs related to Hypertension, Hyperlipidemia, Diabetes, and COPD   Hypertension BP Readings from Last 3 Encounters:  06/24/20 122/74  03/09/20 118/62  11/09/19 120/72   . Pharmacist Clinical Goal(s): o Over the next 365 days, patient will work with PharmD and providers to maintain BP goal <130/80 . Current regimen:  . Amlodipine 5 mg once daily . Propanolol 20 mg twice daily  . Interventions: o We discussed diet/exercise - Maintain a healthy weight and exercise regularly, as directed by your health care provider. Eat healthy foods, such as: Lean proteins, complex carbohydrates, fresh fruits and vegetables, low-fat dairy products, healthy fats. . Patient self care activities - Over the next 365 days, patient will: o Check BP at least once every 1-2 weeks, document, and provide at future appointments o Ensure daily salt intake < 2300 mg/day  Hyperlipidemia Lab Results  Component Value Date/Time   LDLCALC 77 03/09/2020 11:51 AM   LDLCALC 68 07/01/2019 10:19 AM   LDLDIRECT 65.0 06/19/2017 09:01 AM   . Pharmacist Clinical Goal(s): o Over the next 180 days, patient will work with PharmD and providers to achieve LDL goal < 70 . Current regimen:  o Rosuvastatin 40 mg once daily . Interventions: o Reviewed side  effects and cholesterol goals - no side effects noted. . Patient self care activities - Over the next 180 days, patient will: o Continue with diet/exercise o Reach out with any questions Depression . Pharmacist Clinical Goal(s) o Over the next 365 days, patient will work with PharmD and providers to ensure medication safety and minimize symptoms of depression. . Current regimen:  o Sertraline 50 mg once daily o Desvenlafaxine 100 mg once daily . Interventions: o Reviewed side effects, ongoing benefit of current regimen - no side effects or problems noted . Patient self care activities - Over the next 365 days, patient will: o Continue current management o Contact [facility] if wanting to reconsider appointment  COPD . Pharmacist Clinical Goal(s) o Over the next 90 days, patient will work with PharmD and providers to ensure medication safety and affordability . Current regimen:  . Spiriva handihaler 18 mcg capsule - 2 puffs twice daily  . Symbicort 160-4.5 mcg once daily - 2 puffs twice daily . Albuterol sulfate (Proair Respiclick) - 1 puff every 6 hours as needed . Interventions: o Reviewed patient assistance programs - will pursue for spiriva and symbicort. . Patient self care activities - Over the next 90 days, patient will: o Complete patient section of patient assistance application, return to [place] once complete.  Medication management . Pharmacist Clinical Goal(s): o Over the next 365 days, patient will work with PharmD and providers to achieve optimal medication adherence . Current pharmacy: [facility] . Interventions  o Comprehensive medication review performed. o Continue current medication management strategy . Patient self care activities - Over the next 365 days, patient will: o Focus on medication adherence by taking medications as prescribed o Report any questions or concerns to PharmD and/or provider(s) Initial goal  documentation.      Ms. Hoggard was given information about Chronic Care Management services today including:  1. CCM service includes personalized support from designated clinical staff supervised by her physician, including individualized plan of care and coordination with other care providers 2. 24/7 contact phone numbers for assistance for urgent and routine care needs. 3. Standard insurance, coinsurance, copays and deductibles apply for chronic care management only during months in which we provide at least 20 minutes of these services. Most insurances cover these services at 100%, however patients may be responsible for any copay, coinsurance and/or deductible if applicable. This service may help you avoid the need for more expensive face-to-face services. 4. Only one practitioner may furnish and bill the service in a calendar month. 5. The patient may stop CCM services at any time (effective at the end of the month) by phone call to the office staff.  Patient agreed to services and verbal consent obtained.   Verbal consent obtained for [facility] enhanced pharmacy services (medication synchronization, adherence packaging, delivery coordination). A medication sync plan was created to allow patient to get all medications delivered once every 30 to 90 days per patient preference. Patient understands they have freedom to choose pharmacy and clinical pharmacist will coordinate care between all prescribers and [facility].  The patient verbalized understanding of instructions provided today and agreed to receive a MyChart copy of patient instruction and/or educational materials. Telephone follow up appointment with pharmacy team member scheduled for: See next appointment with "Care Management Staff" under "What's Next" below.   Madelin Rear, Pharm.D., BCGP Clinical Pharmacist [facility] [place] [phone]  High Cholesterol  High cholesterol is a condition  in which the blood has high levels of a white, waxy substance similar to fat (cholesterol). The liver makes all the cholesterol that the body needs. The human body needs small amounts of cholesterol to help build cells. A person gets extra or excess cholesterol from the food that he or she eats. The blood carries cholesterol from the liver to the rest of the body. If you have high cholesterol, deposits (plaques) may build up on the walls of your arteries. Arteries are the blood vessels that carry blood away from your heart. These plaques make the arteries narrow and stiff. Cholesterol plaques increase your risk for heart attack and stroke. Work with your health care provider to keep your cholesterol levels in a healthy range. What increases the risk? The following factors may make you more likely to develop this condition:  Eating foods that are high in animal fat (saturated fat) or cholesterol.  Being overweight.  Not getting enough exercise.  A family history of high cholesterol (familial hypercholesterolemia).  Use of tobacco products.  Having diabetes. What are the signs or symptoms? There are no symptoms of this condition. How is this diagnosed? This condition may be diagnosed based on the results of a blood test.  If you are older than 72 years of age, your health care provider may check your cholesterol levels every 4-6 years.  You may be checked more often if you have high cholesterol or other risk factors for heart disease. The blood test for cholesterol measures:  "Bad"  cholesterol, or LDL cholesterol. This is the main type of cholesterol that causes heart disease. The desired level is less than 100 mg/dL.  "Good" cholesterol, or HDL cholesterol. HDL helps protect against heart disease by cleaning the arteries and carrying the LDL to the liver for processing. The desired level for HDL is 60 mg/dL or higher.  Triglycerides. These are fats that your body can store or burn for  energy. The desired level is less than 150 mg/dL.  Total cholesterol. This measures the total amount of cholesterol in your blood and includes LDL, HDL, and triglycerides. The desired level is less than 200 mg/dL. How is this treated? This condition may be treated with:  Diet changes. You may be asked to eat foods that have more fiber and less saturated fats or added sugar.  Lifestyle changes. These may include regular exercise, maintaining a healthy weight, and quitting use of tobacco products.  Medicines. These are given when diet and lifestyle changes have not worked. You may be prescribed a statin medicine to help lower your cholesterol levels. Follow these instructions at home: Eating and drinking  Eat a healthy, balanced diet. This diet includes: ? Daily servings of a variety of fresh, frozen, or canned fruits and vegetables. ? Daily servings of whole grain foods that are rich in fiber. ? Foods that are low in saturated fats and trans fats. These include poultry and fish without skin, lean cuts of meat, and low-fat dairy products. ? A variety of fish, especially oily fish that contain omega-3 fatty acids. Aim to eat fish at least 2 times a week.  Avoid foods and drinks that have added sugar.  Use healthy cooking methods, such as roasting, grilling, broiling, baking, poaching, steaming, and stir-frying. Do not fry your food except for stir-frying.   Lifestyle  Get regular exercise. Aim to exercise for a total of 150 minutes a week. Increase your activity level by doing activities such as gardening, walking, and taking the stairs.  Do not use any products that contain nicotine or tobacco, such as cigarettes, e-cigarettes, and chewing tobacco. If you need help quitting, ask your health care provider.   General instructions  Take over-the-counter and prescription medicines only as told by your health care provider.  Keep all follow-up visits as told by your health care provider.  This is important. Where to find more information  American Heart Association: www.heart.org  National Heart, Lung, and Blood Institute: https://wilson-eaton.com/ Contact a health care provider if:  You have trouble achieving or maintaining a healthy diet or weight.  You are starting an exercise program.  You are unable to stop smoking. Get help right away if:  You have chest pain.  You have trouble breathing.  You have any symptoms of a stroke. "BE FAST" is an easy way to remember the main warning signs of a stroke: ? B - Balance. Signs are dizziness, sudden trouble walking, or loss of balance. ? E - Eyes. Signs are trouble seeing or a sudden change in vision. ? F - Face. Signs are sudden weakness or numbness of the face, or the face or eyelid drooping on one side. ? A - Arms. Signs are weakness or numbness in an arm. This happens suddenly and usually on one side of the body. ? S - Speech. Signs are sudden trouble speaking, slurred speech, or trouble understanding what people say. ? T - Time. Time to call emergency services. Write down what time symptoms started.  You have other signs  of a stroke, such as: ? A sudden, severe headache with no known cause. ? Nausea or vomiting. ? Seizure. These symptoms may represent a serious problem that is an emergency. Do not wait to see if the symptoms will go away. Get medical help right away. Call your local emergency services (911 in the [place] Do not drive yourself to the hospital. Summary  Cholesterol plaques increase your risk for heart attack and stroke. Work with your health care provider to keep your cholesterol levels in a healthy range.  Eat a healthy, balanced diet, get regular exercise, and maintain a healthy weight.  Do not use any products that contain nicotine or tobacco, such as cigarettes, e-cigarettes, and chewing tobacco.  Get help right away if you have any symptoms of a stroke. This information is not intended to replace  advice given to you by your health care provider. Make sure you discuss any questions you have with your health care provider. Document Revised: 07/20/2019 Document Reviewed: 07/20/2019 Elsevier Patient Education  2021 [employer].

## 2020-09-15 NOTE — Progress Notes (Signed)
    Chronic Care Management Pharmacy Assistant   Name: Ridhi Hoffert  MRN: [id number] DOB: 1951/03/08  Reason for Encounter: Patient Assistant Application  PCP : Marin Olp, MD  Allergies:   Allergies  Allergen Reactions  . Codeine Sulfate Itching and Rash  . Hydrocodone-Acetaminophen Itching and Rash    Medications: Outpatient Encounter Medications as of 09/15/2020  Medication Sig  . Albuterol Sulfate (PROAIR RESPICLICK) 425 (90 Base) MCG/ACT AEPB Inhale 1 puff into the lungs every 6 (six) hours as needed (wheezing or shortness of breath).  Marland Kitchen amLODipine (NORVASC) 5 MG tablet Take 1 tablet (5 mg total) by mouth daily.  Marland Kitchen aspirin EC 81 MG tablet Take 1 tablet (81 mg total) by mouth daily.  . cetirizine (ZYRTEC) 10 MG tablet Take 10 mg by mouth at bedtime.  . COLLODION FLEXIBLE EX Apply topically.  Marland Kitchen desvenlafaxine (PRISTIQ) 100 MG 24 hr tablet Take 1 tablet (100 mg total) by mouth daily.  Marland Kitchen DEXILANT 60 MG capsule Take 1 capsule by mouth once daily  . fluticasone (FLONASE) 50 MCG/ACT nasal spray Use 2 spray(s) in each nostril once daily  . gabapentin (NEURONTIN) 300 MG capsule Take 1 capsule by mouth once daily at bedtime  . LORazepam (ATIVAN) 0.5 MG tablet TAKE 1 TABLET BY MOUTH TWICE DAILY AS NEEDED FOR ANXIETY. DON'T DRIVE 8 HOURS AFTER USING  . Multiple Vitamins-Minerals (MULTIVITAMIN ADULT PO) Take 1 tablet by mouth daily.   . nitroGLYCERIN (NITROSTAT) 0.4 MG SL tablet Place 1 tablet (0.4 mg total) under the tongue every 5 (five) minutes as needed for chest pain (if pain does not resolve with 2 doses, call 911).  . propranolol (INDERAL) 20 MG tablet Take 1 tablet by mouth twice daily  . rosuvastatin (CRESTOR) 40 MG tablet Take 1 tablet by mouth once daily  . sertraline (ZOLOFT) 50 MG tablet Take 1 tablet (50 mg total) by mouth daily.  Marland Kitchen SPIRIVA HANDIHALER 18 MCG inhalation capsule Inhale 2 puffs by mouth twice daily  . SYMBICORT 160-4.5 MCG/ACT inhaler Inhale 2 puffs  by mouth twice daily  . [DISCONTINUED] Aclidinium Bromide (TUDORZA PRESSAIR) 400 MCG/ACT AEPB Inhale 1 puff into the lungs 2 (two) times daily.   No facility-administered encounter medications on file as of 09/15/2020.    Current Diagnosis: Patient Active Problem List   Diagnosis Date Noted  . Aortic atherosclerosis ([place]) 03/10/2020  . Restless legs 08/25/2019  . H/O parotidectomy 04/28/2019  . Genetic testing 02/10/2018  . Family history of breast cancer   . 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 Hot flashes 03/22/2016  . Allergic rhinitis 09/22/2015  . Depression 09/22/2015  . Former smoker 09/22/2015  . Adenomatous colon polyp   . Diastolic dysfunction 95/63/8756  . GERD (gastroesophageal reflux disease) 06/04/2014  . S/P AVR (aortic valve replacement) 11/06/2013  . Aortic stenosis 07/07/2010  . CAD (coronary artery disease) 05/17/2010  . Carotid stenosis 05/17/2010  . Herpes simplex virus (HSV) infection 01/16/2010  . HLD (hyperlipidemia) 08/29/2009  . Chronic obstructive pulmonary disease ([place]) 07/23/2007  . Essential hypertension 04/14/2007  . Osteopenia 04/14/2007     Spoke with patient regarding patient assistant applications for Spiriva and Symbicort . Informed patient I will be mailing applications to her and that once she completes application to please return to office.   Georgiana Shore ,[place] Pharmacist Assistant [phone]  Follow-Up:  Pharmacist Review

## 2020-09-20 ENCOUNTER — Telehealth: Payer: Self-pay

## 2020-09-20 MED ORDER — PROPRANOLOL HCL 20 MG PO TABS: 20.0000 mg | ORAL_TABLET | Freq: Two times a day (BID) | ORAL | 3 refills | Status: DC

## 2020-09-20 MED ORDER — SPIRIVA HANDIHALER 18 MCG IN CAPS: 18.0000 ug | ORAL_CAPSULE | Freq: Two times a day (BID) | RESPIRATORY_TRACT | 2 refills | Status: DC

## 2020-09-20 MED ORDER — DEXLANSOPRAZOLE 60 MG PO CPDR: 1.0000 | DELAYED_RELEASE_CAPSULE | Freq: Every day | ORAL | 3 refills | Status: DC

## 2020-09-20 MED ORDER — ROSUVASTATIN CALCIUM 40 MG PO TABS: 40.0000 mg | ORAL_TABLET | Freq: Every day | ORAL | 2 refills | Status: DC

## 2020-09-20 MED ORDER — AMLODIPINE BESYLATE 5 MG PO TABS: 5.0000 mg | ORAL_TABLET | Freq: Every day | ORAL | 3 refills | Status: DC

## 2020-09-20 MED ORDER — GABAPENTIN 300 MG PO CAPS: ORAL_CAPSULE | ORAL | 3 refills | Status: DC

## 2020-09-20 MED ORDER — SYMBICORT 160-4.5 MCG/ACT IN AERO: 2.0000 | INHALATION_SPRAY | Freq: Two times a day (BID) | RESPIRATORY_TRACT | 3 refills | Status: DC

## 2020-09-20 NOTE — Telephone Encounter (Signed)
[facility]

## 2020-09-26 ENCOUNTER — Other Ambulatory Visit: Payer: Self-pay

## 2020-09-26 ENCOUNTER — Other Ambulatory Visit: Payer: PPO | Admitting: *Deleted

## 2020-09-26 DIAGNOSIS — E785 Hyperlipidemia, unspecified: Secondary | ICD-10-CM | POA: Diagnosis not present

## 2020-09-26 DIAGNOSIS — I771 Stricture of artery: Secondary | ICD-10-CM

## 2020-09-26 DIAGNOSIS — I10 Essential (primary) hypertension: Secondary | ICD-10-CM

## 2020-09-26 DIAGNOSIS — I2510 Atherosclerotic heart disease of native coronary artery without angina pectoris: Secondary | ICD-10-CM | POA: Diagnosis not present

## 2020-09-26 LAB — HEPATIC FUNCTION PANEL
ALT: 14 IU/L (ref 0–32)
AST: 18 IU/L (ref 0–40)
Albumin: 4.5 g/dL (ref 3.7–4.7)
Alkaline Phosphatase: 65 IU/L (ref 44–121)
Bilirubin Total: 0.3 mg/dL (ref 0.0–1.2)
Bilirubin, Direct: <0.1 mg/dL (ref 0.00–0.40)
Total Protein: 6.3 g/dL (ref 6.0–8.5)

## 2020-09-26 LAB — LIPID PANEL
Chol/HDL Ratio: 2.6 ratio (ref 0.0–4.4)
Cholesterol, Total: 146 mg/dL (ref 100–199)
HDL: 56 mg/dL (ref >39)
LDL Chol Calc (NIH): 70 mg/dL (ref 0–99)
Triglycerides: 111 mg/dL (ref 0–149)
VLDL Cholesterol Cal: 20 mg/dL (ref 5–40)

## 2020-09-28 ENCOUNTER — Other Ambulatory Visit: Payer: Self-pay

## 2020-09-28 DIAGNOSIS — E785 Hyperlipidemia, unspecified: Secondary | ICD-10-CM

## 2020-09-28 DIAGNOSIS — I2510 Atherosclerotic heart disease of native coronary artery without angina pectoris: Secondary | ICD-10-CM

## 2020-09-28 MED ORDER — EZETIMIBE 10 MG PO TABS
10.0000 mg | ORAL_TABLET | Freq: Every day | ORAL | 3 refills | Status: DC
Start: 2020-09-28 — End: 2021-08-14

## 2020-09-28 NOTE — Progress Notes (Signed)
Orders in for repeat labs and zetia sent to patients pharmacy.

## 2020-10-24 ENCOUNTER — Telehealth: Payer: Self-pay

## 2020-10-24 NOTE — Telephone Encounter (Signed)
Patient is calling in requesting an appointment, as she isnt doing the best mentally, she just lost her fiance this past Friday. Can we use same day on Friday?

## 2020-10-25 NOTE — Telephone Encounter (Signed)
Patient is scheduled   

## 2020-10-27 NOTE — Patient Instructions (Incomplete)
So sorry for your loss. I really appreciate you taking the time to come see [place]- hoping we can help you feel some better  Since pristiq when you restart it can cause anxiety- lets maybe let things settle over next 2 weeks and then recheck. 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 Team can use same day slot

## 2020-10-27 NOTE — Progress Notes (Signed)
Phone [phone] In person visit   Subjective:   Katherine Foley is a 70 y.o. year old very pleasant female patient who presents for/with See problem oriented charting Chief Complaint  Patient presents with  . Mental Health Problem    Discuss mental health. Patient states that she just lost her fiance of 22 years and she knows she's having anxiety attacks again   . Mouth Lesions    Bottom lip full of them   This visit occurred during the SARS-CoV-2 public health emergency.  Safety protocols were in place, including screening questions prior to the visit, additional usage of staff PPE, and extensive cleaning of exam room while observing appropriate contact time as indicated for disinfecting solutions.   Past Medical History-  Patient Active Problem List   Diagnosis Date Noted  . H/O parotidectomy 04/28/2019    Priority: High  . 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 Depression 09/22/2015    Priority: High  . Former smoker 09/22/2015    Priority: High  . S/P AVR (aortic valve replacement) 11/06/2013    Priority: High  . Aortic stenosis 07/07/2010    Priority: High  . CAD (coronary artery disease) 05/17/2010    Priority: High  . Chronic obstructive pulmonary disease ([place]) 07/23/2007    Priority: High  . Aortic atherosclerosis ([place]) 03/10/2020    Priority: Medium  . Restless legs 08/25/2019    Priority: Medium  . Hot flashes 03/22/2016    Priority: Medium  . Diastolic dysfunction 50/05/3817    Priority: Medium  . GERD (gastroesophageal reflux disease) 06/04/2014    Priority: Medium  . Carotid stenosis 05/17/2010    Priority: Medium  . HLD (hyperlipidemia) 08/29/2009    Priority: Medium  . Essential hypertension 04/14/2007    Priority: Medium  . Osteopenia 04/14/2007    Priority: Medium  . Genetic testing 02/10/2018    Priority: Low  . Family history of breast cancer     Priority: Low  . Allergic rhinitis 09/22/2015    Priority: Low  .  Adenomatous colon polyp     Priority: Low  . Herpes simplex virus (HSV) infection 01/16/2010    Priority: Low    Medications- reviewed and updated Current Outpatient Medications  Medication Sig Dispense Refill  . Albuterol Sulfate (PROAIR RESPICLICK) 299 (90 Base) MCG/ACT AEPB Inhale 1 puff into the lungs every 6 (six) hours as needed (wheezing or shortness of breath). 1 each 3  . amLODipine (NORVASC) 5 MG tablet Take 1 tablet (5 mg total) by mouth daily. 90 tablet 3  . aspirin EC 81 MG tablet Take 1 tablet (81 mg total) by mouth daily.    . cetirizine (ZYRTEC) 10 MG tablet Take 10 mg by mouth at bedtime.    Marland Kitchen desvenlafaxine (PRISTIQ) 100 MG 24 hr tablet Take 1 tablet (100 mg total) by mouth daily. 90 tablet 2  . dexlansoprazole (DEXILANT) 60 MG capsule Take 1 capsule (60 mg total) by mouth daily. 90 capsule 3  . ezetimibe (ZETIA) 10 MG tablet Take 1 tablet (10 mg total) by mouth daily. 90 tablet 3  . fluticasone (FLONASE) 50 MCG/ACT nasal spray Use 2 spray(s) in each nostril once daily 16 g 5  . gabapentin (NEURONTIN) 300 MG capsule Take 1 capsule by mouth once daily at bedtime 30 capsule 3  . Multiple Vitamins-Minerals (MULTIVITAMIN ADULT PO) Take 1 tablet by mouth daily.     . nitroGLYCERIN (NITROSTAT) 0.4 MG SL tablet Place 1  tablet (0.4 mg total) under the tongue every 5 (five) minutes as needed for chest pain (if pain does not resolve with 2 doses, call 911). 30 tablet 5  . propranolol (INDERAL) 20 MG tablet Take 1 tablet (20 mg total) by mouth 2 (two) times daily. 180 tablet 3  . rosuvastatin (CRESTOR) 40 MG tablet Take 1 tablet (40 mg total) by mouth daily. 90 tablet 2  . sertraline (ZOLOFT) 50 MG tablet Take 1 tablet (50 mg total) by mouth daily. 90 tablet 3  . SPIRIVA HANDIHALER 18 MCG inhalation capsule Place 1 capsule (18 mcg total) into inhaler and inhale 2 (two) times daily. 90 capsule 2  . SYMBICORT 160-4.5 MCG/ACT inhaler Inhale 2 puffs into the lungs 2 (two) times daily. 11 g  3  . valACYclovir (VALTREX) 1000 MG tablet Take 2 pills twice a day for 1 day at first sign of cold sore 30 tablet 1  . COLLODION FLEXIBLE EX Apply topically. (Patient not taking: Reported on 10/28/2020)    . LORazepam (ATIVAN) 0.5 MG tablet TAKE 1 TABLET BY MOUTH TWICE DAILY AS NEEDED FOR ANXIETY. DON'T DRIVE 8 HOURS AFTER USING 60 tablet 0   No current facility-administered medications for this visit.     Objective:  BP 134/74   Pulse 67   Temp 98.2 F (36.8 C) (Temporal)   Ht 5\' 3"  (1.6 m)   Wt 123 lb 6.4 oz (56 kg)   SpO2 97%   BMI 21.86 kg/m  Gen: NAD, resting comfortably, tearful during visit appropriately so CV: RRR no murmurs rubs or gallops Lungs: CTAB no crackles, wheeze, rhonchi Ext: no edema Skin: warm, dry    Assessment and Plan  #Stress/anxiety/grieving/depression S:Fiancee of 95 years was 72 years old - usually didn't go in for medical care. She didn't go to church one Sunday and went to check on him- he was very weak and incontinent. 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 Also found lung nodules. He died within 5-6 days. There is also some financial stress with losing him- home she is living in- is in his name. His kids will be coming down to take him back to [place] for memorial. She has started boxing up stuff. He died on his sisters birthday which was also hard. Grandson on [employer] in [place].   Patient already under severe stress after losing daughter Katherine Foley 06/2018.   Patient already on pristiq 100mg  (was off for a week) and is compliant with this as well as sertraline 50 mg. Also sparing lorazepam   She is trying to do her best to exercise for stress relief- getting over 5k steps most days despite recent loss  2 ativan left A/P: Patient with  depression but has been on Pristiq for the last week unfortunately due to pharmacy issues.  I am hesitant to restart this while she is going through such an acute  stressor-the first 2 weeks can cause increased anxiety-instead we opted to do close 2-week follow-up to recheck on her-hoping her housing situation at least get stabilized and then may restart at that time-this may be a good opportunity to go ahead and titrate her off Zoloft-I would prefer not to have her on both agents but have been hesitant to stop 1 with prior depression- since she is off one already may be opportune time -refill ativan for acute stressors  # Cold sores S:with recent stress patient has developed cold sores multiple.   A/P: poor  control- refill valtrex to use today- advised to start this evening.          Recommended follow up:  2-3 week follo wup or certainly sooner if needed Future Appointments  Date Time Provider [place]  12/05/2020 10:30 AM [facility] CCM PHARMACIST [facility]  12/27/2020  7:30 AM [facility]  04/27/2021  9:30 AM [facility] HEALTH COACH [facility]    Lab/Order associations:   ICD-10-CM   1. Recurrent major depressive disorder, in partial remission ([place])  F33.41   2. Cold sore  B00.1     Meds ordered this encounter  Medications  . valACYclovir (VALTREX) 1000 MG tablet    Sig: Take 2 pills twice a day for 1 day at first sign of cold sore    Dispense:  30 tablet    Refill:  1  . LORazepam (ATIVAN) 0.5 MG tablet    Sig: TAKE 1 TABLET BY MOUTH TWICE DAILY AS NEEDED FOR ANXIETY. DON'T DRIVE 8 HOURS AFTER USING    Dispense:  60 tablet    Refill:  0    Time Spent: 30 minutes of total time (4:09 PM- 4:39 PM) was spent on the date of the encounter performing the following actions: chart review prior to seeing the patient, obtaining history, performing a medically necessary exam, counseling on the treatment plan, placing orders, and documenting in our EHR.   Return precautions advised.  Garret Reddish, MD

## 2020-10-28 ENCOUNTER — Encounter: Payer: Self-pay | Admitting: Family Medicine

## 2020-10-28 ENCOUNTER — Ambulatory Visit (INDEPENDENT_AMBULATORY_CARE_PROVIDER_SITE_OTHER): Payer: PPO | Admitting: Family Medicine

## 2020-10-28 ENCOUNTER — Other Ambulatory Visit: Payer: Self-pay

## 2020-10-28 VITALS — BP 134/74 | HR 67 | Temp 98.2°F | Ht 63.0 in | Wt 123.4 lb

## 2020-10-28 DIAGNOSIS — F3341 Major depressive disorder, recurrent, in partial remission: Secondary | ICD-10-CM | POA: Diagnosis not present

## 2020-10-28 DIAGNOSIS — B001 Herpesviral vesicular dermatitis: Secondary | ICD-10-CM

## 2020-10-28 MED ORDER — VALACYCLOVIR HCL 1 G PO TABS: ORAL_TABLET | ORAL | 1 refills | Status: DC

## 2020-10-28 MED ORDER — LORAZEPAM 0.5 MG PO TABS: ORAL_TABLET | ORAL | 0 refills | Status: DC

## 2020-10-31 ENCOUNTER — Ambulatory Visit: Payer: Self-pay

## 2020-10-31 NOTE — Progress Notes (Signed)
  Chronic Care Management   Outreach Note   Name: Jobeth Pangilinan MRN: [id number] DOB: Jan 05, 1951  Referred by: Marin Olp, MD  Patient applications received and reviewed for spiriva and symbicort, will provide to PCP for signature.   Madelin Rear, Pharm.D., BCGP Clinical Pharmacist [place] [phone]

## 2020-11-02 NOTE — Progress Notes (Unsigned)
  Chronic Care Management   Outreach Note   Name: Katherine Foley MRN: [id number] DOB: [birth year]  Applications for symbicort and spiriva signed by PCP and faxed to [facility]. Will be scanned into medical record.   Madelin Rear, Pharm.D., BCGP Clinical Pharmacist [facility] [phone]

## 2020-11-10 NOTE — Progress Notes (Signed)
Phone [phone] In person visit   Subjective:   Katherine Foley is a 70 y.o. year old very pleasant female patient who presents for/with See problem oriented charting Chief Complaint  Patient presents with  . Depression    2 week follow up    This visit occurred during the SARS-CoV-2 public health emergency.  Safety protocols were in place, including screening questions prior to the visit, additional usage of staff PPE, and extensive cleaning of exam room while observing appropriate contact time as indicated for disinfecting solutions.   Past Medical History-  Patient Active Problem List   Diagnosis Date Noted  . H/O parotidectomy 04/28/2019    Priority: High  . 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 Depression 09/22/2015    Priority: High  . Former smoker 09/22/2015    Priority: High  . S/P AVR (aortic valve replacement) 11/06/2013    Priority: High  . Aortic stenosis 07/07/2010    Priority: High  . CAD (coronary artery disease) 05/17/2010    Priority: High  . Chronic obstructive pulmonary disease ([place]) 07/23/2007    Priority: High  . Aortic atherosclerosis ([place]) 03/10/2020    Priority: Medium  . Restless legs 08/25/2019    Priority: Medium  . Hot flashes 03/22/2016    Priority: Medium  . Diastolic dysfunction 34/28/7681    Priority: Medium  . GERD (gastroesophageal reflux disease) 06/04/2014    Priority: Medium  . Carotid stenosis 05/17/2010    Priority: Medium  . HLD (hyperlipidemia) 08/29/2009    Priority: Medium  . Essential hypertension 04/14/2007    Priority: Medium  . Osteopenia 04/14/2007    Priority: Medium  . Genetic testing 02/10/2018    Priority: Low  . Family history of breast cancer     Priority: Low  . Allergic rhinitis 09/22/2015    Priority: Low  . Adenomatous colon polyp     Priority: Low  . Herpes simplex virus (HSV) infection 01/16/2010    Priority: Low    Medications- reviewed and updated Current Outpatient  Medications  Medication Sig Dispense Refill  . Albuterol Sulfate (PROAIR RESPICLICK) 157 (90 Base) MCG/ACT AEPB Inhale 1 puff into the lungs every 6 (six) hours as needed (wheezing or shortness of breath). 1 each 3  . amLODipine (NORVASC) 5 MG tablet Take 1 tablet (5 mg total) by mouth daily. 90 tablet 3  . aspirin EC 81 MG tablet Take 1 tablet (81 mg total) by mouth daily.    . cetirizine (ZYRTEC) 10 MG tablet Take 10 mg by mouth at bedtime.    . COLLODION FLEXIBLE EX Apply topically.    Marland Kitchen dexlansoprazole (DEXILANT) 60 MG capsule Take 1 capsule (60 mg total) by mouth daily. 90 capsule 3  . ezetimibe (ZETIA) 10 MG tablet Take 1 tablet (10 mg total) by mouth daily. 90 tablet 3  . fluticasone (FLONASE) 50 MCG/ACT nasal spray Use 2 spray(s) in each nostril once daily 16 g 5  . gabapentin (NEURONTIN) 300 MG capsule Take 1 capsule by mouth once daily at bedtime 30 capsule 3  . LORazepam (ATIVAN) 0.5 MG tablet TAKE 1 TABLET BY MOUTH TWICE DAILY AS NEEDED FOR ANXIETY. DON'T DRIVE 8 HOURS AFTER USING 60 tablet 0  . Multiple Vitamins-Minerals (MULTIVITAMIN ADULT PO) Take 1 tablet by mouth daily.     . nitroGLYCERIN (NITROSTAT) 0.4 MG SL tablet Place 1 tablet (0.4 mg total) under the tongue every 5 (five) minutes as needed for chest pain (if  pain does not resolve with 2 doses, call 911). 30 tablet 5  . propranolol (INDERAL) 20 MG tablet Take 1 tablet (20 mg total) by mouth 2 (two) times daily. 180 tablet 3  . rosuvastatin (CRESTOR) 40 MG tablet Take 1 tablet (40 mg total) by mouth daily. 90 tablet 2  . SPIRIVA HANDIHALER 18 MCG inhalation capsule Place 1 capsule (18 mcg total) into inhaler and inhale 2 (two) times daily. 90 capsule 2  . SYMBICORT 160-4.5 MCG/ACT inhaler Inhale 2 puffs into the lungs 2 (two) times daily. 11 g 3  . valACYclovir (VALTREX) 1000 MG tablet Take 2 pills twice a day for 1 day at first sign of cold sore 30 tablet 1  . desvenlafaxine (PRISTIQ) 100 MG 24 hr tablet Take 1 tablet (100  mg total) by mouth daily. (Patient not taking: Reported on 11/11/2020) 90 tablet 2   No current facility-administered medications for this visit.     Objective:  BP (!) 142/80   Pulse (!) 58   Temp 98 F (36.7 C) (Temporal)   Ht 5\' 3"  (1.6 m)   Wt 124 lb 6.4 oz (56.4 kg)   SpO2 97%   BMI 22.04 kg/m  Gen: NAD, resting comfortably     Assessment and Plan  #Stress/anxiety/grieving/depression S: Lost her fianc of 55 years at 70 years old prior to last visit due to completely obstructed carotid arteries and had multiple lung nodules-he died within 5 to 6 days.  He died on her sister's birthday.  Yolanda Bonine was also on the USS treatment in the [place].  Patient already had significant stress/grieving from losing daughter [name] October 2019  Patient was concerned about losing her home-she had lived with him but did not have rights to the home.  At last visit patient was on Zoloft 50 mg alone.  She had come off her Pristiq due to insurance issues/pharmacy issues-we were concerned about restarting due to potential to worsen acute anxiety.  And we wanted her housing situation to stabilize.  In the past we had hoped not to have her on Zoloft and Pristiq together and were looking at visit today as a potential opportunity to restart Pristiq and wean off of Zoloft due to already being off the Pristiq   We did refill Ativan for acute stressors  Having periods of prolonged crying. Has had brief thoughts of self harm with no plan (feels like would be better off not here). moreso wonders why is she here. Has had some good days as well  In the process of getting her things packed up- fiancees kids are having it appraised. Does have a friend she can move in with for now. Also got a job at [employer] a at [employer] Depression screen [facility] 2/9 11/11/2020 10/28/2020 04/21/2020  Decreased Interest 2 0 1  Down, Depressed, Hopeless 3 3 1   PHQ - 2 Score 5 3 2   Altered sleeping 3 3 1   Tired, decreased  energy 3 1 1   Change in appetite 3 3 1   Feeling bad or failure about yourself  3 3 0  Trouble concentrating 3 3 1   Moving slowly or fidgety/restless 0 0 1  Suicidal thoughts 1 1 0  PHQ-9 Score 21 17 7   Difficult doing work/chores Not difficult at all Very difficult Somewhat difficult  Some recent data might be hidden  A/P: depression/grieving noted and still with poor control-slightly worse than last visit per PHQ-9 currently only on Zoloft 50 mg. We opted to do  a half tablet of zoloft/sertraline tonight along with half tablet of pristiq. Tomorrow nigt take a full pristiq and stop the zoloft completely.  Patient seems in more stable position overall and I think now is a better time for transition.  If she has  thoughts of self-harm should contact us immediately or call 911 -finally heard from grandson in [employer] [place] and hes doing ok and comes home in may hopefully -She has secured housing with her friend -She has a new job at [employer] to help with income concerns  #Blood pressure mildly elevated-recheck next visit  Recommended follow up: 4 weeks Future Appointments  Date Time Provider [place]  12/05/2020 10:30 AM [facility] CCM PHARMACIST [facility]  12/09/2020 10:00 AM Marin Olp, MD [facility]  12/27/2020  7:30 AM [facility]  04/27/2021  9:30 AM [facility] HEALTH COACH [facility]   Lab/Order associations:   ICD-10-CM   1. Severe episode of recurrent major depressive disorder, without psychotic features ([place])  F33.2    Return precautions advised.  Garret Reddish, MD

## 2020-11-10 NOTE — Patient Instructions (Addendum)
We opted to do a half tablet of zoloft/sertraline tonight along with half tablet of pristiq. Tomorrow nigt take a full pristiq and stop the zoloft completely.    4 week follow up or sooner if you need me.   I think hospice grief counseling would be a great choice  Taking the medicine as directed and not missing any doses is one of the best things you can do to treat your depression.  Here are some things to keep in mind:  1) Side effects (stomach upset, some increased anxiety) may happen before you notice a benefit.  These side effects typically go away over time. 2) Changes to your dose of medicine or a change in medication all together is sometimes necessary 3) Many people will notice an improvement within two weeks but the full effect of the medication can take up to 6-12 weeks 4) Stopping the medication when you start feeling better often results in a return of symptoms 5) If you start having thoughts of hurting yourself or others after starting this medicine, call our office immediately at [phone] or seek care through 911.

## 2020-11-11 ENCOUNTER — Other Ambulatory Visit: Payer: Self-pay

## 2020-11-11 ENCOUNTER — Ambulatory Visit (INDEPENDENT_AMBULATORY_CARE_PROVIDER_SITE_OTHER): Payer: PPO | Admitting: Family Medicine

## 2020-11-11 ENCOUNTER — Encounter: Payer: Self-pay | Admitting: Family Medicine

## 2020-11-11 VITALS — BP 142/80 | HR 58 | Temp 98.0°F | Ht 63.0 in | Wt 124.4 lb

## 2020-11-11 DIAGNOSIS — F332 Major depressive disorder, recurrent severe without psychotic features: Secondary | ICD-10-CM | POA: Diagnosis not present

## 2020-11-22 ENCOUNTER — Telehealth: Payer: Self-pay

## 2020-11-22 NOTE — Progress Notes (Signed)
Chronic Care Management Pharmacy Assistant   Name: Raidyn Breiner  MRN: [id number] DOB: 1951-06-17  Reason for Encounter: Medication Coordination    Medications: Outpatient Encounter Medications as of 11/22/2020  Medication Sig   Albuterol Sulfate (PROAIR RESPICLICK) 188 (90 Base) MCG/ACT AEPB Inhale 1 puff into the lungs every 6 (six) hours as needed (wheezing or shortness of breath).   amLODipine (NORVASC) 5 MG tablet Take 1 tablet (5 mg total) by mouth daily.   aspirin EC 81 MG tablet Take 1 tablet (81 mg total) by mouth daily.   cetirizine (ZYRTEC) 10 MG tablet Take 10 mg by mouth at bedtime.   COLLODION FLEXIBLE EX Apply topically.   desvenlafaxine (PRISTIQ) 100 MG 24 hr tablet Take 1 tablet (100 mg total) by mouth daily. (Patient not taking: Reported on 11/11/2020)   dexlansoprazole (DEXILANT) 60 MG capsule Take 1 capsule (60 mg total) by mouth daily.   ezetimibe (ZETIA) 10 MG tablet Take 1 tablet (10 mg total) by mouth daily.   fluticasone (FLONASE) 50 MCG/ACT nasal spray Use 2 spray(s) in each nostril once daily   gabapentin (NEURONTIN) 300 MG capsule Take 1 capsule by mouth once daily at bedtime   LORazepam (ATIVAN) 0.5 MG tablet TAKE 1 TABLET BY MOUTH TWICE DAILY AS NEEDED FOR ANXIETY. DON'T DRIVE 8 HOURS AFTER USING   Multiple Vitamins-Minerals (MULTIVITAMIN ADULT PO) Take 1 tablet by mouth daily.    nitroGLYCERIN (NITROSTAT) 0.4 MG SL tablet Place 1 tablet (0.4 mg total) under the tongue every 5 (five) minutes as needed for chest pain (if pain does not resolve with 2 doses, call 911).   propranolol (INDERAL) 20 MG tablet Take 1 tablet (20 mg total) by mouth 2 (two) times daily.   rosuvastatin (CRESTOR) 40 MG tablet Take 1 tablet (40 mg total) by mouth daily.   SPIRIVA HANDIHALER 18 MCG inhalation capsule Place 1 capsule (18 mcg total) into inhaler and inhale 2 (two) times daily.   SYMBICORT 160-4.5 MCG/ACT inhaler Inhale 2 puffs into the lungs 2 (two)  times daily.   valACYclovir (VALTREX) 1000 MG tablet Take 2 pills twice a day for 1 day at first sign of cold sore   [DISCONTINUED] Aclidinium Bromide (TUDORZA PRESSAIR) 400 MCG/ACT AEPB Inhale 1 puff into the lungs 2 (two) times daily.   No facility-administered encounter medications on file as of 11/22/2020.    Reviewed chart for medication changes ahead of medication coordination call.  No OVs, Consults, or hospital visits since last care coordination call/Pharmacist visit. (If appropriate, list visit date, provider name)  No medication changes indicated OR if recent visit, treatment plan here.  BP Readings from Last 3 Encounters:  11/11/20 (!) 142/80  10/28/20 134/74  06/24/20 122/74    Lab Results  Component Value Date   HGBA1C 6.2 (H) 04/27/2011     Patient obtains medications through Vials  30 Days   Last adherence delivery included:  Ezetimibe 10 mg Tab   Patient is due for next adherence delivery on: 11-29-20 Called patient and reviewed medications and coordinated delivery.  This delivery to include: Gabapentin 300 mg Take one capsule by mouth everyday at bedtime Ezetimibe 10 mg Tab Take one tab by mouth once daily Dexlansoprazole 60 mg Cap Take one capsule once daily Lorazepam 0.5mg  Take one tab by mouth twice daily as needed for anxiety  Propranolol 20 mg tab Take one tab by mouth twice daily    Patient needs refills for :  Lorazepam 0.5mg  Take one  tab by mouth twice daily as needed for anxiety -CPP to request   Patient Declined Medications: Desvenlafaxine Succnt Er 100 mg Take one tab every morning- Patient stated she did not need Amlodipine 5 mg Tab  Take one tab by mouth once daily - Patient stated she did not need   Confirmed delivery date of 11-29-20, advised patient that pharmacy will contact them the morning of delivery.  Georgiana Shore ,[place] Pharmacist Assistant [phone]

## 2020-11-25 ENCOUNTER — Telehealth (INDEPENDENT_AMBULATORY_CARE_PROVIDER_SITE_OTHER): Payer: PPO | Admitting: Family Medicine

## 2020-11-25 ENCOUNTER — Encounter: Payer: Self-pay | Admitting: Family Medicine

## 2020-11-25 VITALS — Ht 63.0 in

## 2020-11-25 DIAGNOSIS — J441 Chronic obstructive pulmonary disease with (acute) exacerbation: Secondary | ICD-10-CM

## 2020-11-25 MED ORDER — AMOXICILLIN-POT CLAVULANATE 875-125 MG PO TABS: 1.0000 | ORAL_TABLET | Freq: Two times a day (BID) | ORAL | 0 refills | Status: DC

## 2020-11-25 MED ORDER — PREDNISONE 20 MG PO TABS: ORAL_TABLET | ORAL | 0 refills | Status: DC

## 2020-11-25 NOTE — Progress Notes (Signed)
Patient ID: Katherine Foley, female   DOB: February 21, 1951, 70 y.o.   MRN: [id number]  This visit type was conducted due to national recommendations for restrictions regarding the COVID-19 pandemic in an effort to limit this patient's exposure and mitigate transmission in our community.   Virtual Visit via Video Note  I connected with Katherine Foley on 11/25/20 at  1:45 PM EDT by a video enabled telemedicine application and verified that I am speaking with the correct person using two identifiers.  Location patient: home Location provider:work or home office Persons participating in the virtual visit: patient, provider  I discussed the limitations of evaluation and management by telemedicine and the availability of in person appointments. The patient expressed understanding and agreed to proceed.   HPI:  Katherine Foley has history of COPD.  She called with onset about 3 days ago of some headaches, frontal sinus pressure, maxillary sinus pressure, sore throat, cough productive of green sputum.  She states she has frequent allergy symptoms and takes Zyrtec and Flonase at baseline.  No fever.  No chills.  No significant dyspnea.  She has been COVID vaccinated.  No sick contacts.  She takes Spiriva and Symbicort at baseline and has been compliant with these recently.  She states with similar exacerbations of COPD in the past she has required steroids and antibiotics.  She does have a rescue inhaler to use as needed.  ROS: See pertinent positives and negatives per HPI.  Past Medical History:  Diagnosis Date  . Adenomatous colon polyp   . Anxiety   . ANXIETY DEPRESSION 02/15/2009  . AORTIC STENOSIS 07/07/2010   a. echo 12/23/10: EF 60-65%, mod to severe AS with mean gradient 29 mmHg;  b. 04/2011 s/p bioprosthetic AVR. // Echo 9/19:  Mild conc LVH, EF 60-65, no RWMA, Gr 1 DD, AVR ok (mean 10, peak 21), trivial MR, normal RVSF, mild TR   . Barrett's esophagus 04/14/2007  . Barrett's esophagus 2000  . CAD,  NATIVE VESSEL 05/17/2010   a. NSTEMI 8/11 - BMS to RCA;  b. cath 4/12: dLAD 50%, RI 80% (PCI), AVCFX 30%, pRCA 30%, stent ok, 40-50, then 50% dist RCA;  c. s/p Promus DES to RI 12/25/10;  d. 04/2011 CABG x 1 (LIMA->LAD) @ time of AVR  . Carotid stenosis 05/17/2010   a. dopplers 38/10: RICA 17-51%; LICA 0-25% (follow up due 11/12)  . Chronic bronchitis ([place])    "multiple times" (03/25/2013)  . 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 Exertional shortness of breath    "related to COPD problems" (03/25/2013)  . Family history of breast cancer    2 daughters; PALB2 mutation in family  . Genetic testing 02/10/2018   PALB2 analysis at [facility] - Negative for familial mutation  . GERD (gastroesophageal reflux disease)   . H/O hiatal hernia   . Hemorrhoid thrombosis    "had it lanced" (03/25/2013)  . Herpes simplex without mention of complication 8/52/7782  . History of blood transfusion    'w/OHS" (03/25/2013)  . [place], MILD 08/29/2009  . HYPERTENSION 04/14/2007  . MIGRAINE HEADACHE 04/20/2008  . Myocardial infarction [facility]) 2011   "during catherization" (03/25/2013)  . OSTEOPENIA 04/14/2007  . Ovarian cyst   . Parotid mass    left  . Pneumonia    "multiple times" (03/25/2013)  . URINARY INCONTINENCE, STRESS, MILD 08/26/2007  . Urine, incontinence, stress female     Past Surgical History:  Procedure Laterality Date  . AORTIC VALVE REPLACEMENT  2012  .  APPENDECTOMY    . BLEPHAROPLASTY Bilateral ~ 1998  . CARDIAC CATHETERIZATION N/A 07/06/2016   Procedure: Left Heart Cath and Cors/Grafts Angiography;  Surgeon: Sherren Mocha, MD;  Location: [place] CV LAB;  Service: Cardiovascular;  Laterality: N/A;  . CHOLECYSTECTOMY N/A 03/25/2013   Procedure: LAPAROSCOPIC CHOLECYSTECTOMY WITH INTRAOPERATIVE CHOLANGIOGRAM;  Surgeon: Imogene Burn. Georgette Dover, MD;  Location: [place];  Service: General;  Laterality: N/A;  . CORONARY ANGIOGRAPHY N/A 05/26/2018   Procedure: CORONARY ANGIOGRAPHY  (CATH LAB);  Surgeon: Jettie Booze, MD;  Location: [place] [facility];  Service: Cardiovascular;  Laterality: N/A;  . CORONARY ANGIOPLASTY WITH STENT PLACEMENT  2010   "I have 2 stents" (03/25/2013)  . NASAL SINUS SURGERY  1997; 1998   "cust in maxillary sinus; put a stent in then removed it" (03/25/2013)  . OVARIAN CYST REMOVAL Right 2011   "size of a soccer ball" (03/25/2013)  . PAROTIDECTOMY Left 04/28/2019   Procedure: LEFT TOTAL PAROTIDECTOMY;  Surgeon: Leta Baptist, MD;  Location: [facility];  Service: ENT;  Laterality: Left;  . thrombosed hemorrohid lanced    . TONSILLECTOMY AND ADENOIDECTOMY    . TOTAL ABDOMINAL HYSTERECTOMY      Family History  Problem Relation Age of Onset  . Coronary artery disease Other   . Heart disease Other        6 stents  . Heart disease Mother   . COPD Father        smoked  . Heart disease Father   . Rheum arthritis Father   . Heart disease Maternal Uncle   . Breast cancer Daughter        currently 41  . Asthma Son        as a child   . Breast cancer Daughter 57       currently 51; PALB2 mutation  . Cancer Other        very distant paternal cousin; pancreatic ca  . Cirrhosis Sister   . Stroke Brother   . Colon cancer Neg Hx   . Colon polyps Neg Hx     SOCIAL HX: Ex smoker   Current Outpatient Medications:  .  Albuterol Sulfate (PROAIR RESPICLICK) 098 (90 Base) MCG/ACT AEPB, Inhale 1 puff into the lungs every 6 (six) hours as needed (wheezing or shortness of breath)., Disp: 1 each, Rfl: 3 .  amLODipine (NORVASC) 5 MG tablet, Take 1 tablet (5 mg total) by mouth daily., Disp: 90 tablet, Rfl: 3 .  amoxicillin-clavulanate (AUGMENTIN) 875-125 MG tablet, Take 1 tablet by mouth 2 (two) times daily., Disp: 20 tablet, Rfl: 0 .  aspirin EC 81 MG tablet, Take 1 tablet (81 mg total) by mouth daily., Disp:  , Rfl:  .  cetirizine (ZYRTEC) 10 MG tablet, Take 10 mg by mouth at bedtime., Disp: , Rfl:  .  COLLODION FLEXIBLE EX, Apply  topically., Disp: , Rfl:  .  dexlansoprazole (DEXILANT) 60 MG capsule, Take 1 capsule (60 mg total) by mouth daily., Disp: 90 capsule, Rfl: 3 .  ezetimibe (ZETIA) 10 MG tablet, Take 1 tablet (10 mg total) by mouth daily., Disp: 90 tablet, Rfl: 3 .  fluticasone (FLONASE) 50 MCG/ACT nasal spray, Use 2 spray(s) in each nostril once daily, Disp: 16 g, Rfl: 5 .  gabapentin (NEURONTIN) 300 MG capsule, Take 1 capsule by mouth once daily at bedtime, Disp: 30 capsule, Rfl: 3 .  LORazepam (ATIVAN) 0.5 MG tablet, TAKE 1 TABLET BY MOUTH TWICE DAILY AS NEEDED FOR ANXIETY.  DON'T DRIVE 8 HOURS AFTER USING, Disp: 60 tablet, Rfl: 0 .  Multiple Vitamins-Minerals (MULTIVITAMIN ADULT PO), Take 1 tablet by mouth daily. , Disp: , Rfl:  .  nitroGLYCERIN (NITROSTAT) 0.4 MG SL tablet, Place 1 tablet (0.4 mg total) under the tongue every 5 (five) minutes as needed for chest pain (if pain does not resolve with 2 doses, call 911)., Disp: 30 tablet, Rfl: 5 .  predniSONE (DELTASONE) 20 MG tablet, Take 2 tablets by mouth once daily for 5 days, Disp: 10 tablet, Rfl: 0 .  propranolol (INDERAL) 20 MG tablet, Take 1 tablet (20 mg total) by mouth 2 (two) times daily., Disp: 180 tablet, Rfl: 3 .  rosuvastatin (CRESTOR) 40 MG tablet, Take 1 tablet (40 mg total) by mouth daily., Disp: 90 tablet, Rfl: 2 .  SPIRIVA HANDIHALER 18 MCG inhalation capsule, Place 1 capsule (18 mcg total) into inhaler and inhale 2 (two) times daily., Disp: 90 capsule, Rfl: 2 .  SYMBICORT 160-4.5 MCG/ACT inhaler, Inhale 2 puffs into the lungs 2 (two) times daily., Disp: 11 g, Rfl: 3 .  valACYclovir (VALTREX) 1000 MG tablet, Take 2 pills twice a day for 1 day at first sign of cold sore, Disp: 30 tablet, Rfl: 1 .  desvenlafaxine (PRISTIQ) 100 MG 24 hr tablet, Take 1 tablet (100 mg total) by mouth daily. (Patient not taking: No sig reported), Disp: 90 tablet, Rfl: 2  EXAM:  VITALS per patient if applicable:  GENERAL: alert, oriented, appears well and in no acute  distress  HEENT: atraumatic, conjunttiva clear, no obvious abnormalities on inspection of external nose and ears  NECK: normal movements of the head and neck  LUNGS: on inspection no signs of respiratory distress, breathing rate appears normal, no obvious gross SOB, gasping or wheezing  CV: no obvious cyanosis  MS: moves all visible extremities without noticeable abnormality  PSYCH/NEURO: pleasant and cooperative, no obvious depression or anxiety, speech and thought processing grossly intact  ASSESSMENT AND PLAN:  Discussed the following assessment and plan:  Longstanding history of COPD.  Patient relates acute illness with productive cough.  No respiratory distress currently.  Clinical suspicion for Covid is low.  -Continue her usual inhalers -We will send down Augmentin 875 mg twice daily for 10 days -Prednisone 20 mg take 2 tablets once daily for 5 days -Stay well-hydrated -Follow-up with primary for any persistent or worsening symptoms     I discussed the assessment and treatment plan with the patient. The patient was provided an opportunity to ask questions and all were answered. The patient agreed with the plan and demonstrated an understanding of the instructions.   The patient was advised to call back or seek an in-person evaluation if the symptoms worsen or if the condition fails to improve as anticipated.     Carolann Littler, MD

## 2020-11-28 ENCOUNTER — Other Ambulatory Visit: Payer: Self-pay | Admitting: Family Medicine

## 2020-11-28 NOTE — Addendum Note (Signed)
Addended by: Clyde Lundborg A on: 11/28/2020 10:37 AM   Modules accepted: Orders

## 2020-11-28 NOTE — Telephone Encounter (Signed)
Thanks for sending- I refilled

## 2020-11-28 NOTE — Telephone Encounter (Signed)
Sent to Dr. Hunter for approval.  °

## 2020-11-28 NOTE — Telephone Encounter (Signed)
Last Refill  10/31/2020 Last OV 11/11/2020 dx Depressive disorder

## 2020-12-05 ENCOUNTER — Ambulatory Visit (INDEPENDENT_AMBULATORY_CARE_PROVIDER_SITE_OTHER): Payer: PPO

## 2020-12-05 ENCOUNTER — Telehealth: Payer: Self-pay

## 2020-12-05 DIAGNOSIS — J449 Chronic obstructive pulmonary disease, unspecified: Secondary | ICD-10-CM | POA: Diagnosis not present

## 2020-12-05 DIAGNOSIS — F3341 Major depressive disorder, recurrent, in partial remission: Secondary | ICD-10-CM

## 2020-12-05 NOTE — Chronic Care Management (AMB) (Signed)
    Chronic Care Management Pharmacy Assistant   Name: Katherine Foley  MRN: [id number] DOB: [birth year]  Reason for Encounter: Patient Assistance Documentation    Medications: Outpatient Encounter Medications as of 12/05/2020  Medication Sig  . Albuterol Sulfate (PROAIR RESPICLICK) 440 (90 Base) MCG/ACT AEPB Inhale 1 puff into the lungs every 6 (six) hours as needed (wheezing or shortness of breath).  Marland Kitchen amLODipine (NORVASC) 5 MG tablet Take 1 tablet (5 mg total) by mouth daily.  Marland Kitchen amoxicillin-clavulanate (AUGMENTIN) 875-125 MG tablet Take 1 tablet by mouth 2 (two) times daily.  Marland Kitchen aspirin EC 81 MG tablet Take 1 tablet (81 mg total) by mouth daily.  . cetirizine (ZYRTEC) 10 MG tablet Take 10 mg by mouth at bedtime.  . COLLODION FLEXIBLE EX Apply topically.  Marland Kitchen desvenlafaxine (PRISTIQ) 100 MG 24 hr tablet Take 1 tablet (100 mg total) by mouth daily. (Patient not taking: No sig reported)  . dexlansoprazole (DEXILANT) 60 MG capsule Take 1 capsule (60 mg total) by mouth daily.  Marland Kitchen ezetimibe (ZETIA) 10 MG tablet Take 1 tablet (10 mg total) by mouth daily.  . fluticasone (FLONASE) 50 MCG/ACT nasal spray Use 2 spray(s) in each nostril once daily  . gabapentin (NEURONTIN) 300 MG capsule Take 1 capsule by mouth once daily at bedtime  . LORazepam (ATIVAN) 0.5 MG tablet TAKE ONE TABLET BY MOUTH TWICE DAILY AS NEEDED FOR ANXIETY. DON'T DRIVE EIGHT HOURS AFTER USING  . Multiple Vitamins-Minerals (MULTIVITAMIN ADULT PO) Take 1 tablet by mouth daily.   . nitroGLYCERIN (NITROSTAT) 0.4 MG SL tablet Place 1 tablet (0.4 mg total) under the tongue every 5 (five) minutes as needed for chest pain (if pain does not resolve with 2 doses, call 911).  . predniSONE (DELTASONE) 20 MG tablet Take 2 tablets by mouth once daily for 5 days  . propranolol (INDERAL) 20 MG tablet Take 1 tablet (20 mg total) by mouth 2 (two) times daily.  . rosuvastatin (CRESTOR) 40 MG tablet Take 1 tablet (40 mg total) by mouth daily.  Marland Kitchen  SPIRIVA HANDIHALER 18 MCG inhalation capsule Place 1 capsule (18 mcg total) into inhaler and inhale 2 (two) times daily.  . SYMBICORT 160-4.5 MCG/ACT inhaler Inhale 2 puffs into the lungs 2 (two) times daily.  . valACYclovir (VALTREX) 1000 MG tablet Take 2 pills twice a day for 1 day at first sign of cold sore  . [DISCONTINUED] Aclidinium Bromide (TUDORZA PRESSAIR) 400 MCG/ACT AEPB Inhale 1 puff into the lungs 2 (two) times daily.   No facility-administered encounter medications on file as of 12/05/2020.   I called [employer] program to inquire the status of the patients application for medication Symbicort. They were missing the patients [name]. [name] given to program. The application was processed during the time of the call and has been approved through dates 12/05/2020 - 09/02/2021. I left a voicemail with this information for the patient.  [name], [place] Pharmacist Assistant [phone]

## 2020-12-05 NOTE — Progress Notes (Signed)
Chronic Care Management Pharmacy Note  12/05/2020 Name:  Kealy Lewter MRN:  [id number] DOB:  [birth year]  Subjective: Katherine Foley is an 70 y.o. year old female who is a primary patient of Hunter, Brayton Mars, MD.  The CCM team was consulted for assistance with disease management and care coordination needs.    Engaged with patient by telephone for follow up visit in response to provider referral for pharmacy case management and/or care coordination services.   Consent to Services:  The patient was given information about Chronic Care Management services, agreed to services, and gave verbal consent prior to initiation of services.  Please see initial visit note for detailed documentation.   Patient Care Team: Marin Olp, MD as PCP - General (Family Medicine) Sherren Mocha, MD as PCP - Cardiology (Cardiology) Bensimhon, Shaune Pascal, MD (Cardiology) Tanda Rockers, MD as Consulting Physician (Pulmonary Disease) Jamesetta Geralds, DC (Chiropractic Medicine) Leta Baptist, MD as Consulting Physician (Otolaryngology) Madelin Rear, [facility] (Pharmacist) Madelin Rear, [facility] as Pharmacist (Pharmacist)  Objective:  Lab Results  Component Value Date   CREATININE 0.52 03/09/2020   CREATININE 0.62 11/09/2019   CREATININE 0.67 11/02/2019    Lab Results  Component Value Date   HGBA1C 6.2 (H) 04/27/2011   Last diabetic Eye exam: No results found for: HMDIABEYEEXA  Last diabetic Foot exam: No results found for: HMDIABFOOTEX      Component Value Date/Time   CHOL 146 09/26/2020 0925   TRIG 111 09/26/2020 0925   HDL 56 09/26/2020 0925   CHOLHDL 2.6 09/26/2020 0925   CHOLHDL 3 03/09/2020 1151   VLDL 28.4 03/09/2020 1151   LDLCALC 70 09/26/2020 0925   LDLDIRECT 65.0 06/19/2017 0901    Hepatic Function Latest Ref Rng & Units 09/26/2020 03/09/2020 11/09/2019  Total Protein 6.0 - 8.5 g/dL 6.3 6.2 6.2  Albumin 3.7 - 4.7 g/dL 4.5 4.2 4.1  AST 0 - 40 IU/L _0 ALT 0 - 32 IU/L _1 Alk Phosphatase 44 - 121 IU/L 65 64 57  Total Bilirubin 0.0 - 1.2 mg/dL 0.3 0.3 0.4  Bilirubin, Direct 0.00 - 0.40 mg/dL <0.10 - -    Lab Results  Component Value Date/Time   TSH 0.55 02/01/2017 09:24 AM   TSH 0.384 (L) 12/12/2016 04:15 PM    CBC Latest Ref Rng & Units 03/09/2020 11/09/2019 08/26/2019  WBC 4.0 - 10.5 K/uL 5.7 6.0 8.7  Hemoglobin 12.0 - 15.0 g/dL 12.7 12.8 12.4  Hematocrit 36.0 - 46.0 % 38.6 39.1 37.8  Platelets 150.0 - 400.0 K/uL 196.0 193.0 259.0    No results found for: VD25OH  Clinical ASCVD: Yes  The ASCVD Risk score Mikey Bussing DC Jr., et al., 2013) failed to calculate for the following reasons:   The patient has a prior MI or stroke diagnosis    Other: (CHADS2VASc if Afib, PHQ9 if depression, MMRC or CAT for COPD, ACT, DEXA)  Social History   Tobacco Use  Smoking Status Former Smoker  . Packs/day: 1.50  . Years: 44.00  . Pack years: 66.00  . Types: Cigarettes  . Quit date: 09/03/2005  . Years since quitting: 15.2  Smokeless Tobacco Never Used   BP Readings from Last 3 Encounters:  11/11/20 (!) 142/80  10/28/20 134/74  06/24/20 122/74   Pulse Readings from Last 3 Encounters:  11/11/20 (!) 58  10/28/20 67  06/24/20 (!) 58   Wt Readings from Last 3 Encounters:  11/11/20 124 lb 6.4 oz (56.4  kg)  10/28/20 123 lb 6.4 oz (56 kg)  06/24/20 127 lb 9.6 oz (57.9 kg)    Assessment: Review of patient past medical history, allergies, medications, health status, including review of consultants reports, laboratory and other test data, was performed as part of comprehensive evaluation and provision of chronic care management services.   SDOH:  (Social Determinants of Health) assessments and interventions performed: Yes   CCM Care Plan  Allergies  Allergen Reactions  . Codeine Sulfate Itching and Rash  . Hydrocodone-Acetaminophen Itching and Rash    Medications Reviewed Today    Reviewed by Madelin Rear, [facility] (Pharmacist) on 12/05/20 at 1217  Med List  Status: <None>  Medication Order Taking? Sig Documenting Provider Last Dose Status Informant        Discontinued 11/09/11 1414   Albuterol Sulfate (PROAIR RESPICLICK) 425 (90 Base) MCG/ACT AEPB [id number] No Inhale 1 puff into the lungs every 6 (six) hours as needed (wheezing or shortness of breath). Marin Olp, MD Taking Active Self  amLODipine (NORVASC) 5 MG tablet [id number] No Take 1 tablet (5 mg total) by mouth daily. Marin Olp, MD Taking Active   amoxicillin-clavulanate (AUGMENTIN) 875-125 MG tablet [id number]  Take 1 tablet by mouth 2 (two) times daily. Eulas Post, MD  Active   aspirin EC 81 MG tablet [id number] No Take 1 tablet (81 mg total) by mouth daily. Richardson Dopp T, PA-C Taking Active   cetirizine (ZYRTEC) 10 MG tablet [id number] No Take 10 mg by mouth at bedtime. [provider] Taking Active Self  COLLODION [place] [id number] No Apply topically. [provider] Taking Active   desvenlafaxine (PRISTIQ) 100 MG 24 hr tablet [id number] No Take 1 tablet (100 mg total) by mouth daily.  Patient not taking: No sig reported   Marin Olp, MD Not Taking Active   dexlansoprazole (DEXILANT) 60 MG capsule [id number] No Take 1 capsule (60 mg total) by mouth daily. Marin Olp, MD Taking Active   ezetimibe (ZETIA) 10 MG tablet [id number] No Take 1 tablet (10 mg total) by mouth daily. Richardson Dopp T, PA-C Taking Active   fluticasone [facility]) 50 MCG/ACT nasal spray [id number] No Use 2 spray(s) in each nostril once daily Marin Olp, MD Taking Active            Med Note Quentin Cornwall, [place] R   Fri Jun 24, 2020 10:46 AM)    gabapentin (NEURONTIN) 300 MG capsule [id number] No Take 1 capsule by mouth once daily at bedtime Marin Olp, MD Taking Active   LORazepam (ATIVAN) 0.5 MG tablet [id number]  TAKE ONE TABLET BY MOUTH TWICE DAILY AS NEEDED FOR ANXIETY. DON'T DRIVE EIGHT HOURS AFTER USING Marin Olp, MD  Active   Multiple  Vitamins-Minerals (MULTIVITAMIN ADULT PO) [id number] No Take 1 tablet by mouth daily.  [provider] Taking Active Self  nitroGLYCERIN (NITROSTAT) 0.4 MG SL tablet [id number] No Place 1 tablet (0.4 mg total) under the tongue every 5 (five) minutes as needed for chest pain (if pain does not resolve with 2 doses, call 911). Marin Olp, MD Taking Active   predniSONE (DELTASONE) 20 MG tablet [id number]  Take 2 tablets by mouth once daily for 5 days Eulas Post, MD  Active   propranolol (INDERAL) 20 MG tablet [id number] No Take 1 tablet (20 mg total) by mouth 2 (two) times daily. Marin Olp, MD Taking Active   rosuvastatin (CRESTOR) 40 MG tablet [id number] No Take 1 tablet (40  mg total) by mouth daily. Marin Olp, MD Taking Active   SPIRIVA HANDIHALER 18 MCG inhalation capsule [id number] No Place 1 capsule (18 mcg total) into inhaler and inhale 2 (two) times daily. Marin Olp, MD Taking Active   SYMBICORT 160-4.5 MCG/ACT inhaler [id number] No Inhale 2 puffs into the lungs 2 (two) times daily. Marin Olp, MD Taking Active   valACYclovir (VALTREX) 1000 MG tablet [id number] No Take 2 pills twice a day for 1 day at first sign of cold sore Marin Olp, MD Taking Active           Patient Active Problem List   Diagnosis Date Noted  . Aortic atherosclerosis ([place]) 03/10/2020  . Restless legs 08/25/2019  . H/O parotidectomy 04/28/2019  . Genetic testing 02/10/2018  . Family history of breast cancer   . 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 Hot flashes 03/22/2016  . Allergic rhinitis 09/22/2015  . Depression 09/22/2015  . Former smoker 09/22/2015  . Adenomatous colon polyp   . Diastolic dysfunction 44/09/270  . GERD (gastroesophageal reflux disease) 06/04/2014  . S/P AVR (aortic valve replacement) 11/06/2013  . Aortic stenosis 07/07/2010  . CAD (coronary artery disease) 05/17/2010  . Carotid stenosis 05/17/2010  . Herpes simplex virus (HSV) infection  01/16/2010  . HLD (hyperlipidemia) 08/29/2009  . Chronic obstructive pulmonary disease ([place]) 07/23/2007  . Essential hypertension 04/14/2007  . Osteopenia 04/14/2007    Immunization History  Administered Date(s) Administered  . Fluad Quad(high Dose 65+) 07/14/2020  . Influenza Split 06/19/2011, 07/02/2012  . Influenza Whole 08/03/2009  . Influenza, High Dose Seasonal PF 05/20/2015, 06/04/2019, 06/04/2019  . Influenza,inj,Quad PF,6+ Mos 05/21/2013, 05/14/2014, 06/17/2018  . Influenza-Unspecified 08/17/2016, 06/19/2017, 06/17/2018  . PFIZER(Purple Top)SARS-COV-2 Vaccination 10/20/2019, 11/09/2019, 06/30/2020  . Pneumococcal Conjugate-13 06/14/2014  . Pneumococcal Polysaccharide-23 03/22/2016  . Td 08/29/2009, 03/17/2020  . Zoster 01/21/2016  . Zoster Recombinat (Shingrix) 03/10/2018, 07/15/2018    Conditions to be addressed/monitored: CAD, HTN, HLD, COPD, Depression and GERD  Care Plan : [place]  Updates made by Madelin Rear, [facility] since 12/05/2020 12:00 AM    Problem: CAD, HTN, HLD, COPD, Depression and GERD   Priority: High    Long-Range Goal: Disease Management   Start Date: 12/05/2020  Expected End Date: 12/05/2021  This Visit's Progress: On track  Priority: High  Note:   Current Barriers:  . Unable to independently afford treatment regimen  Pharmacist Clinical Goal(s):  Marland Kitchen Patient will verbalize ability to afford treatment regimen through collaboration with PharmD and provider.   Interventions: . 1:1 collaboration with Marin Olp, MD regarding development and update of comprehensive plan of care as evidenced by provider attestation and co-signature . Inter-disciplinary care team collaboration (see longitudinal plan of care) . Comprehensive medication review performed; medication list updated in electronic medical record . No medication changes  COPD (Goal: control symptoms and prevent exacerbations) Had an exacerbation treated with  prednisone/augmentin through Dr Elease Hashimoto 3/25.   -Not ideally controlled -Current treatment  . Symbicort 160-4.5 mcg/act 2 puffs into the lungs two times daily . Spiriva 1 capsule into the inhaler and inhale two times daily -Gold Grade: Gold 2 (FEV1 50-79%) -Pulmonary function testing: last 2016 -Exacerbations requiring treatment in last 6 months: two (04/22/2020 and 11/25/2020) -Patient reports consistent use of maintenance inhaler -Counseled on Proper inhaler technique; Benefits of consistent maintenance inhaler use -Counseled on diet and exercise extensively Assessed patient finances. Has been approved for spiriva patient assistance. has not heard anything  on symbicort so will reach out to [employer] for update. - CPA to call.  Depression/Anxiety (Goal: minimize recurring symptoms, ensure medication therapy) Some good days and some bad days in dealing with grief due passing of daughter and fiance. Has successfully transitioned to monotherapy with Pristiq, no longer using sertraline.  Has been walking 1-2 miles nearly every day, also working part time at [employer] now.   -Not ideally controlled -Current treatment: . Pristiq 100 mg once daily -Declined counseling at this time -Educated on Benefits of medication for symptom control -Recommended to continue current medication PCP f/u rescheduled for late April  Patient Goals/Self-Care Activities . Patient will:  - take medications as prescribed  Medication Assistance: Utilizing [facility].  Patient's preferred pharmacy is:  [facility] - [place], [place] - [address] [place] [address] Phone: [phone] Fax: [phone]  Follow Up:  Patient agrees to Care Plan and Follow-up. Plan: CPA to f/u on Symbicort patient assistance, monthly med synch. [place] telephone 3 months.    Future Appointments  Date Time Provider [place]  12/23/2020 10:00 AM Marin Olp, MD [facility]  12/27/2020  7:30 AM [facility]  03/13/2021  9:00 AM [facility]CM PHARMACIST [facility]  04/27/2021  9:30 AM [facility] HEALTH COACH [facility]   Madelin Rear, Pharm.D., BCGP Clinical Pharmacist [place] [phone]

## 2020-12-05 NOTE — Patient Instructions (Signed)
Ms. Minnifield,  Thank you for taking the time to review your medications with me today.  I have included our care plan/goals in the following pages. Please review and call me at [phone] with any questions!  Thanks! Ellin Mayhew, Pharm.D., BCGP Clinical Pharmacist [facility] [place] [phone] Patient Care Plan: [place] Plan    Problem Identified: CAD, HTN, HLD, COPD, Depression and GERD   Priority: High    Long-Range Goal: Disease Management   Start Date: 12/05/2020  Expected End Date: 12/05/2021  This Visit's Progress: On track  Priority: High  Note:   Current Barriers:  . Unable to independently afford treatment regimen  Pharmacist Clinical Goal(s):  Marland Kitchen Patient will verbalize ability to afford treatment regimen through collaboration with PharmD and provider.   Interventions: . 1:1 collaboration with Marin Olp, MD regarding development and update of comprehensive plan of care as evidenced by provider attestation and co-signature . Inter-disciplinary care team collaboration (see longitudinal plan of care) . Comprehensive medication review performed; medication list updated in electronic medical record . No medication changes  COPD (Goal: control symptoms and prevent exacerbations) Had an exacerbation treated with prednisone/augmentin through Dr Elease Hashimoto 3/25.   -Not ideally controlled -Current treatment  . Symbicort 160-4.5 mcg/act 2 puffs into the lungs two times daily . Spiriva 1 capsule into the inhaler and inhale two times daily -Gold Grade: Gold 2 (FEV1 50-79%) -Pulmonary function testing: last 2016 -Exacerbations requiring treatment in last 6 months: two (04/22/2020 and 11/25/2020) -Patient reports consistent use of maintenance inhaler -Counseled on Proper inhaler technique; Benefits of consistent maintenance inhaler use -Counseled on diet and exercise extensively Assessed patient finances. Has  been approved for spiriva patient assistance. has not heard anything on symbicort so will reach out to [employer] for update. - CPA to call.  Depression/Anxiety (Goal: minimize recurring symptoms, ensure medication therapy) Some good days and some bad days in dealing with grief due passing of daughter and fiance. Has successfully transitioned to monotherapy with Pristiq, no longer using sertraline.  Has been walking 1-2 miles nearly every day, also working part time at [employer] now.   -Not ideally controlled -Current treatment: . Pristiq 100 mg once daily -Declined counseling at this time -Educated on Benefits of medication for symptom control -Recommended to continue current medication PCP f/u rescheduled for late April  Patient Goals/Self-Care Activities . Patient will:  - take medications as prescribed  Medication Assistance: Utilizing [facility].  Patient's preferred pharmacy is:  [facility] - [place], [place] - [address] [place] [address] Phone: [phone] Fax: [phone]  Follow Up:  Patient agrees to Care Plan and Follow-up. Plan: CPA to f/u on Symbicort patient assistance, monthly med synch. [place] telephone 3 months.      The patient verbalized understanding of instructions provided today and agreed to receive a MyChart copy of patient instruction and/or educational materials. Telephone follow up appointment with pharmacy team member scheduled for: See next appointment with "Care Management Staff" under "What's Next" below.

## 2020-12-09 ENCOUNTER — Ambulatory Visit: Payer: PPO | Admitting: Family Medicine

## 2020-12-22 NOTE — Progress Notes (Signed)
Phone [phone] In person visit   Subjective:   Katherine Foley is a 70 y.o. year old very pleasant female patient who presents for/with See problem oriented charting Chief Complaint  Patient presents with  . Depression    This visit occurred during the SARS-CoV-2 public health emergency.  Safety protocols were in place, including screening questions prior to the visit, additional usage of staff PPE, and extensive cleaning of exam room while observing appropriate contact time as indicated for disinfecting solutions.   Past Medical History-  Patient Active Problem List   Diagnosis Date Noted  . H/O parotidectomy 04/28/2019    Priority: High  . 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 Depression 09/22/2015    Priority: High  . Former smoker 09/22/2015    Priority: High  . S/P AVR (aortic valve replacement) 11/06/2013    Priority: High  . Aortic stenosis 07/07/2010    Priority: High  . CAD (coronary artery disease) 05/17/2010    Priority: High  . Chronic obstructive pulmonary disease ([place]) 07/23/2007    Priority: High  . Aortic atherosclerosis ([place]) 03/10/2020    Priority: Medium  . Restless legs 08/25/2019    Priority: Medium  . Hot flashes 03/22/2016    Priority: Medium  . Diastolic dysfunction 99/35/7017    Priority: Medium  . GERD (gastroesophageal reflux disease) 06/04/2014    Priority: Medium  . Carotid stenosis 05/17/2010    Priority: Medium  . HLD (hyperlipidemia) 08/29/2009    Priority: Medium  . Essential hypertension 04/14/2007    Priority: Medium  . Osteopenia 04/14/2007    Priority: Medium  . Genetic testing 02/10/2018    Priority: Low  . Family history of breast cancer     Priority: Low  . Allergic rhinitis 09/22/2015    Priority: Low  . Adenomatous colon polyp     Priority: Low  . Herpes simplex virus (HSV) infection 01/16/2010    Priority: Low    Medications- reviewed and updated Current Outpatient Medications   Medication Sig Dispense Refill  . amLODipine (NORVASC) 5 MG tablet Take 1 tablet (5 mg total) by mouth daily. 90 tablet 3  . amoxicillin-clavulanate (AUGMENTIN) 875-125 MG tablet Take 1 tablet by mouth 2 (two) times daily. 20 tablet 0  . aspirin EC 81 MG tablet Take 1 tablet (81 mg total) by mouth daily.    Marland Kitchen buPROPion (WELLBUTRIN XL) 150 MG 24 hr tablet Take 1 tablet (150 mg total) by mouth daily. 30 tablet 5  . cetirizine (ZYRTEC) 10 MG tablet Take 10 mg by mouth at bedtime.    . COLLODION FLEXIBLE EX Apply topically.    Marland Kitchen desvenlafaxine (PRISTIQ) 100 MG 24 hr tablet Take 1 tablet (100 mg total) by mouth daily. 90 tablet 2  . dexlansoprazole (DEXILANT) 60 MG capsule Take 1 capsule (60 mg total) by mouth daily. 90 capsule 3  . ezetimibe (ZETIA) 10 MG tablet Take 1 tablet (10 mg total) by mouth daily. 90 tablet 3  . fluticasone (FLONASE) 50 MCG/ACT nasal spray Use 2 spray(s) in each nostril once daily 16 g 5  . gabapentin (NEURONTIN) 300 MG capsule Take 1 capsule by mouth once daily at bedtime 30 capsule 3  . LORazepam (ATIVAN) 0.5 MG tablet TAKE ONE TABLET BY MOUTH TWICE DAILY AS NEEDED FOR ANXIETY. DON'T DRIVE EIGHT HOURS AFTER USING 60 tablet 0  . Multiple Vitamins-Minerals (MULTIVITAMIN ADULT PO) Take 1 tablet by mouth daily.     . nitroGLYCERIN (  NITROSTAT) 0.4 MG SL tablet Place 1 tablet (0.4 mg total) under the tongue every 5 (five) minutes as needed for chest pain (if pain does not resolve with 2 doses, call 911). 30 tablet 5  . predniSONE (DELTASONE) 20 MG tablet Take 2 tablets by mouth once daily for 5 days 10 tablet 0  . propranolol (INDERAL) 20 MG tablet Take 1 tablet (20 mg total) by mouth 2 (two) times daily. 180 tablet 3  . rosuvastatin (CRESTOR) 40 MG tablet Take 1 tablet (40 mg total) by mouth daily. 90 tablet 2  . SPIRIVA HANDIHALER 18 MCG inhalation capsule Place 1 capsule (18 mcg total) into inhaler and inhale 2 (two) times daily. 90 capsule 2  . SYMBICORT 160-4.5 MCG/ACT  inhaler Inhale 2 puffs into the lungs 2 (two) times daily. 11 g 3  . valACYclovir (VALTREX) 1000 MG tablet Take 2 pills twice a day for 1 day at first sign of cold sore 30 tablet 1  . Albuterol Sulfate (PROAIR RESPICLICK) 737 (90 Base) MCG/ACT AEPB Inhale 1 puff into the lungs every 6 (six) hours as needed (wheezing or shortness of breath). 1 each 3   No current facility-administered medications for this visit.     Objective:  BP 126/66   Pulse 60   Temp 98.1 F (36.7 C) (Temporal)   Ht 5\' 3"  (1.6 m)   Wt 119 lb 9.6 oz (54.3 kg)   SpO2 96%   BMI 21.19 kg/m  Gen: NAD, resting comfortably, tearful at times, down mood CV: RRR stable murmur Lungs: CTAB no crackles, wheeze, rhonchi Ext: no edema Skin: warm, dry    Assessment and Plan   # Depression/grieving-lost fianc of 22 years at age 78 this year on her sister's birthday.  Lost her best friend/daughter Angie 2019 S: Medication: Pristiq 100 mg- Was able to wean off of Zoloft without worsening of symptoms  -Last visit patient was able to secure housing as well as a job to help her financially- [employer] [address].  Grandson in the military's position was also more stable -she is moving out of the shared condo a week ago -thoughts that she would be better off dead but no thoughts of self harm Depression screen [facility] 2/9 12/23/2020 11/11/2020 10/28/2020  Decreased Interest 3 2 0  Down, Depressed, Hopeless 2 3 3   PHQ - 2 Score 5 5 3   Altered sleeping 3 3 3   Tired, decreased energy 3 3 1   Change in appetite 3 3 3   Feeling bad or failure about yourself  2 3 3   Trouble concentrating 3 3 3   Moving slowly or fidgety/restless 1 0 0  Suicidal thoughts 1 1 1   PHQ-9 Score 21 21 17   Difficult doing work/chores Somewhat difficult Not difficult at all Very difficult  Some recent data might be hidden  A/P: Poor control of depression.  Also breathing is playing a large role in this- recommended she consider therapy or grief counseling with  hospice.  We will continue Pristiq 100 mg and also add Wellbutrin 150 mg at low-dose-if she feels like this increases her anxiety too much we can certainly stop this and look at other options.  -considered abilify but not best option with heart disease -could also refer to psychiatry if not making progress  Recommended follow up:  4-6 weeks or sooner if needed Future Appointments  Date Time Provider [place]  12/27/2020  7:30 AM [facility]  03/13/2021  9:00 AM [facility] PHARMACIST [facility]  04/27/2021  9:30 AM [facility] HEALTH COACH [facility]    Lab/Order associations:   ICD-10-CM   1. Severe episode of recurrent major depressive disorder, without psychotic features ([place])  F33.2     Meds ordered this encounter  Medications  . buPROPion (WELLBUTRIN XL) 150 MG 24 hr tablet    Sig: Take 1 tablet (150 mg total) by mouth daily.    Dispense:  30 tablet    Refill:  5  . Albuterol Sulfate (PROAIR RESPICLICK) 003 (90 Base) MCG/ACT AEPB    Sig: Inhale 1 puff into the lungs every 6 (six) hours as needed (wheezing or shortness of breath).    Dispense:  1 each    Refill:  3  Return precautions advised.  Garret Reddish, MD

## 2020-12-22 NOTE — Patient Instructions (Addendum)
Please call [phone] to schedule a visit with [facility]  Calling hospice grief counseling is another great option  Try wellbutrin 150mg  extended release in the morning.   Follow up in 4-6 weeks or sooner if needed  So proud of you for putting one foot in front of the other in this incredibly challenging time  If any thoughts of self harm call us or 911 immediately  Recommended follow up: Return in about 4 weeks (around 01/20/2021) for follow up- or sooner if needed.

## 2020-12-23 ENCOUNTER — Other Ambulatory Visit: Payer: Self-pay

## 2020-12-23 ENCOUNTER — Ambulatory Visit (INDEPENDENT_AMBULATORY_CARE_PROVIDER_SITE_OTHER): Payer: PPO | Admitting: Family Medicine

## 2020-12-23 ENCOUNTER — Encounter: Payer: Self-pay | Admitting: Family Medicine

## 2020-12-23 VITALS — BP 126/66 | HR 60 | Temp 98.1°F | Ht 63.0 in | Wt 119.6 lb

## 2020-12-23 DIAGNOSIS — F332 Major depressive disorder, recurrent severe without psychotic features: Secondary | ICD-10-CM | POA: Diagnosis not present

## 2020-12-23 MED ORDER — PROAIR RESPICLICK 108 (90 BASE) MCG/ACT IN AEPB: 1.0000 | INHALATION_SPRAY | Freq: Four times a day (QID) | RESPIRATORY_TRACT | 3 refills | Status: DC | PRN

## 2020-12-23 MED ORDER — BUPROPION HCL ER (XL) 150 MG PO TB24: 150.0000 mg | ORAL_TABLET | Freq: Every day | ORAL | 5 refills | Status: DC

## 2020-12-27 ENCOUNTER — Other Ambulatory Visit: Payer: Self-pay

## 2020-12-27 ENCOUNTER — Other Ambulatory Visit: Payer: PPO | Admitting: *Deleted

## 2020-12-27 DIAGNOSIS — I2510 Atherosclerotic heart disease of native coronary artery without angina pectoris: Secondary | ICD-10-CM | POA: Diagnosis not present

## 2020-12-27 DIAGNOSIS — E785 Hyperlipidemia, unspecified: Secondary | ICD-10-CM | POA: Diagnosis not present

## 2020-12-28 LAB — HEPATIC FUNCTION PANEL
ALT: 39 IU/L — ABNORMAL HIGH (ref 0–32)
AST: 33 IU/L (ref 0–40)
Albumin: 4.3 g/dL (ref 3.7–4.7)
Alkaline Phosphatase: 71 IU/L (ref 44–121)
Bilirubin Total: 0.3 mg/dL (ref 0.0–1.2)
Bilirubin, Direct: <0.1 mg/dL (ref 0.00–0.40)
Total Protein: 6.1 g/dL (ref 6.0–8.5)

## 2020-12-28 LAB — LIPID PANEL
Chol/HDL Ratio: 1.9 ratio (ref 0.0–4.4)
Cholesterol, Total: 131 mg/dL (ref 100–199)
HDL: 68 mg/dL (ref >39)
LDL Chol Calc (NIH): 47 mg/dL (ref 0–99)
Triglycerides: 81 mg/dL (ref 0–149)
VLDL Cholesterol Cal: 16 mg/dL (ref 5–40)

## 2021-01-11 ENCOUNTER — Telehealth: Payer: Self-pay

## 2021-01-11 MED ORDER — SYMBICORT 160-4.5 MCG/ACT IN AERO: 2.0000 | INHALATION_SPRAY | Freq: Two times a day (BID) | RESPIRATORY_TRACT | 3 refills | Status: DC

## 2021-01-11 NOTE — Telephone Encounter (Signed)
  LAST APPOINTMENT DATE: 12/23/2020   NEXT APPOINTMENT DATE:@5 /20/2022  MEDICATION:SYMBICORT 160-4.5 MCG/ACT inhaler  PHARMACY:[facility] - [place], [place] - [address]   Please advise

## 2021-01-11 NOTE — Telephone Encounter (Signed)
Refill sent.

## 2021-01-15 DIAGNOSIS — S5012XA Contusion of left forearm, initial encounter: Secondary | ICD-10-CM | POA: Diagnosis not present

## 2021-01-15 DIAGNOSIS — W548XXA Other contact with dog, initial encounter: Secondary | ICD-10-CM | POA: Diagnosis not present

## 2021-01-15 DIAGNOSIS — M79632 Pain in left forearm: Secondary | ICD-10-CM | POA: Diagnosis not present

## 2021-01-19 NOTE — Patient Instructions (Addendum)
Start prednisone for early COPD flareup-if not improving by next week we will need to send an antibiotic likely   After the cough/congestion is better I would like to have you increase Wellbutrin to 300 mg-I just do not want to do this along with the prednisone in case there are side effects such as increased anxiety  Still consider therapy or hospice grief counseling  Recommended follow up: Return in about 2 months (around 03/22/2021) for follow up- or sooner if needed.

## 2021-01-19 NOTE — Progress Notes (Addendum)
Phone [phone] In person visit   Subjective:   Katherine Foley is a 70 y.o. year old very pleasant female patient who presents for/with See problem oriented charting Chief Complaint  Patient presents with  . Hypertension  . Hyperlipidemia  . Depression    This visit occurred during the SARS-CoV-2 public health emergency.  Safety protocols were in place, including screening questions prior to the visit, additional usage of staff PPE, and extensive cleaning of exam room while observing appropriate contact time as indicated for disinfecting solutions.   Past Medical History-  Patient Active Problem List   Diagnosis Date Noted  . H/O parotidectomy 04/28/2019    Priority: High  . 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 Depression 09/22/2015    Priority: High  . Former smoker 09/22/2015    Priority: High  . S/P AVR (aortic valve replacement) 11/06/2013    Priority: High  . Aortic stenosis 07/07/2010    Priority: High  . CAD (coronary artery disease) 05/17/2010    Priority: High  . Chronic obstructive pulmonary disease ([place]) 07/23/2007    Priority: High  . Aortic atherosclerosis ([place]) 03/10/2020    Priority: Medium  . Restless legs 08/25/2019    Priority: Medium  . Hot flashes 03/22/2016    Priority: Medium  . Diastolic dysfunction 18/29/9371    Priority: Medium  . GERD (gastroesophageal reflux disease) 06/04/2014    Priority: Medium  . Carotid stenosis 05/17/2010    Priority: Medium  . HLD (hyperlipidemia) 08/29/2009    Priority: Medium  . Essential hypertension 04/14/2007    Priority: Medium  . Osteopenia 04/14/2007    Priority: Medium  . Genetic testing 02/10/2018    Priority: Low  . Family history of breast cancer     Priority: Low  . Allergic rhinitis 09/22/2015    Priority: Low  . Adenomatous colon polyp     Priority: Low  . Herpes simplex virus (HSV) infection 01/16/2010    Priority: Low    Medications- reviewed and  updated Current Outpatient Medications  Medication Sig Dispense Refill  . Albuterol Sulfate (PROAIR RESPICLICK) 696 (90 Base) MCG/ACT AEPB Inhale 1 puff into the lungs every 6 (six) hours as needed (wheezing or shortness of breath). 1 each 3  . amLODipine (NORVASC) 5 MG tablet Take 1 tablet (5 mg total) by mouth daily. 90 tablet 3  . aspirin EC 81 MG tablet Take 1 tablet (81 mg total) by mouth daily.    Marland Kitchen buPROPion (WELLBUTRIN XL) 300 MG 24 hr tablet Take 1 tablet (300 mg total) by mouth daily. 30 tablet 5  . cetirizine (ZYRTEC) 10 MG tablet Take 10 mg by mouth at bedtime.    Marland Kitchen desvenlafaxine (PRISTIQ) 100 MG 24 hr tablet Take 1 tablet (100 mg total) by mouth daily. 90 tablet 2  . dexlansoprazole (DEXILANT) 60 MG capsule Take 1 capsule (60 mg total) by mouth daily. 90 capsule 3  . ezetimibe (ZETIA) 10 MG tablet Take 1 tablet (10 mg total) by mouth daily. 90 tablet 3  . fluticasone (FLONASE) 50 MCG/ACT nasal spray Use 2 spray(s) in each nostril once daily 16 g 5  . gabapentin (NEURONTIN) 300 MG capsule Take 1 capsule by mouth once daily at bedtime 30 capsule 3  . Multiple Vitamins-Minerals (MULTIVITAMIN ADULT PO) Take 1 tablet by mouth daily.     . propranolol (INDERAL) 20 MG tablet Take 1 tablet (20 mg total) by mouth 2 (two) times daily. 180 tablet  3  . rosuvastatin (CRESTOR) 40 MG tablet Take 1 tablet (40 mg total) by mouth daily. 90 tablet 2  . SPIRIVA HANDIHALER 18 MCG inhalation capsule Place 1 capsule (18 mcg total) into inhaler and inhale 2 (two) times daily. 90 capsule 2  . SYMBICORT 160-4.5 MCG/ACT inhaler Inhale 2 puffs into the lungs 2 (two) times daily. 11 g 3  . valACYclovir (VALTREX) 1000 MG tablet Take 2 pills twice a day for 1 day at first sign of cold sore 30 tablet 1  . COLLODION FLEXIBLE EX Apply topically. (Patient not taking: Reported on 01/20/2021)    . LORazepam (ATIVAN) 0.5 MG tablet TAKE ONE TABLET BY MOUTH TWICE DAILY AS NEEDED FOR ANXIETY. DON'T DRIVE EIGHT HOURS AFTER  USING 60 tablet 0  . nitroGLYCERIN (NITROSTAT) 0.4 MG SL tablet Place 1 tablet (0.4 mg total) under the tongue every 5 (five) minutes as needed for chest pain (if pain does not resolve with 2 doses, call 911). (Patient not taking: Reported on 01/20/2021) 30 tablet 5  . predniSONE (DELTASONE) 20 MG tablet Take 2 tablets by mouth once daily for 5 days 10 tablet 0   No current facility-administered medications for this visit.     Objective:  BP 124/72   Pulse 60   Temp 97.9 F (36.6 C) (Temporal)   Ht 5\' 3"  (1.6 m)   Wt 118 lb (53.5 kg)   SpO2 94%   BMI 20.90 kg/m  Gen: NAD, resting comfortably Intermittent mild cough CV: RRR Lungs: CTAB no crackles, wheeze, rhonchi- no wheeze at moment but has been using inhaler Ext: no edema Skin: warm, dry    Assessment and Plan   # Depression/grieving-loss of fianc of 70 years of age 29 this year on her sister's birthday.  Previously lost her best friend/daughter Angie in 2019 S: Medication: Wellbutrin 150mg  daily, Pristiq 100 mg. wellbutrin added last visit- concern could worsen anxiety. Thankfully wellbutrin has not worsened her anxiety. Perhaps mild improvement in symptoms -abilify not best option with heart disease -living with roommate /sharing house - working at [employer] and very fast paced- on [employer] -tweaked her wrist- door closed on it when dog when ran out- went to urgent care- had x-ray at urgent care and thankfully no fracture -family put obituary in [place] paper and she was not mentioned which was hurtful to her- had been with him over a quarter of his life - No SI Depression screen [facility] 2/9 01/20/2021 12/23/2020 11/11/2020  Decreased Interest 2 3 2   Down, Depressed, Hopeless 2 2 3   PHQ - 2 Score 4 5 5   Altered sleeping 3 3 3   Tired, decreased energy 3 3 3   Change in appetite 3 3 3   Feeling bad or failure about yourself  0 2 3  Trouble concentrating 1 3 3   Moving slowly or fidgety/restless 0 1 0  Suicidal thoughts 0 1 1   PHQ-9 Score 14 21 21   Difficult doing work/chores Somewhat difficult Somewhat difficult Not difficult at all  Some recent data might be hidden  A/P: Patient with mild improvement in depression with PHQ-9 from 21 down to 14-we will continue Pristiq 100 mg and increase Wellbutrin to 300 mg extended release-have her wait until she finishes the prednisone and follow-up in 2 months-sooner if needed.  If she has any thoughts of self-harm should contact us immediately as per prior discussions  # COPD  S: Patient reports stable shortness of breath. Cough has increased in last 3 days- also  with sinus drainage - notes purulent green sputum.  Maintenance medications: spiriva, symbicort  Patient has had to use albuterol 1 x per day up from twice a week or less A/P: Patient seems to have early COPD exacerbation with increase in sputum and change in color-Trial prednisone over the weekend-if she fails to improve we will need to consider antibiotics next week -test for covid and self isolate until test back. If worsening symptoms should seek care [facility] over weekend  Recommended follow up: Return in about 2 months (around 03/22/2021) for follow up- or sooner if needed. Future Appointments  Date Time Provider [place]  03/13/2021  9:00 AM [facility] CCM PHARMACIST [facility]  04/27/2021  9:30 AM [facility] HEALTH COACH [facility]   Lab/Order associations:   ICD-10-CM   1. Recurrent major depressive disorder, in partial remission ([place])  F33.41   2. COPD exacerbation ([place])  J44.1     Meds ordered this encounter  Medications  . buPROPion (WELLBUTRIN XL) 300 MG 24 hr tablet    Sig: Take 1 tablet (300 mg total) by mouth daily.    Dispense:  30 tablet    Refill:  5  . LORazepam (ATIVAN) 0.5 MG tablet    Sig: TAKE ONE TABLET BY MOUTH TWICE DAILY AS NEEDED FOR ANXIETY. DON'T DRIVE EIGHT HOURS AFTER USING    Dispense:  60 tablet    Refill:  0  . DISCONTD: predniSONE (DELTASONE) 20 MG tablet     Sig: Take 2 tablets by mouth once daily for 5 days    Dispense:  10 tablet    Refill:  0  . predniSONE (DELTASONE) 20 MG tablet    Sig: Take 2 tablets by mouth once daily for 5 days    Dispense:  10 tablet    Refill:  0      Return precautions advised.  Garret Reddish, MD

## 2021-01-20 ENCOUNTER — Other Ambulatory Visit: Payer: Self-pay

## 2021-01-20 ENCOUNTER — Ambulatory Visit (INDEPENDENT_AMBULATORY_CARE_PROVIDER_SITE_OTHER): Payer: PPO | Admitting: Family Medicine

## 2021-01-20 ENCOUNTER — Encounter: Payer: Self-pay | Admitting: Family Medicine

## 2021-01-20 VITALS — BP 124/72 | HR 60 | Temp 97.9°F | Ht 63.0 in | Wt 118.0 lb

## 2021-01-20 DIAGNOSIS — E785 Hyperlipidemia, unspecified: Secondary | ICD-10-CM

## 2021-01-20 DIAGNOSIS — R059 Cough, unspecified: Secondary | ICD-10-CM | POA: Diagnosis not present

## 2021-01-20 DIAGNOSIS — F332 Major depressive disorder, recurrent severe without psychotic features: Secondary | ICD-10-CM

## 2021-01-20 DIAGNOSIS — I10 Essential (primary) hypertension: Secondary | ICD-10-CM

## 2021-01-20 DIAGNOSIS — F3341 Major depressive disorder, recurrent, in partial remission: Secondary | ICD-10-CM | POA: Diagnosis not present

## 2021-01-20 DIAGNOSIS — J441 Chronic obstructive pulmonary disease with (acute) exacerbation: Secondary | ICD-10-CM

## 2021-01-20 MED ORDER — BUPROPION HCL ER (XL) 300 MG PO TB24: 300.0000 mg | ORAL_TABLET | Freq: Every day | ORAL | 5 refills | Status: DC

## 2021-01-20 MED ORDER — PREDNISONE 20 MG PO TABS: ORAL_TABLET | ORAL | 0 refills | Status: DC

## 2021-01-20 MED ORDER — LORAZEPAM 0.5 MG PO TABS: ORAL_TABLET | ORAL | 0 refills | Status: DC

## 2021-01-20 NOTE — Addendum Note (Signed)
Addended by: Linton Ham on: 01/20/2021 10:42 AM   Modules accepted: Orders

## 2021-01-21 LAB — NOVEL CORONAVIRUS, NAA: SARS-CoV-2, NAA: NOT DETECTED

## 2021-01-21 LAB — SARS-COV-2, NAA 2 DAY TAT

## 2021-01-29 DIAGNOSIS — W108XXA Fall (on) (from) other stairs and steps, initial encounter: Secondary | ICD-10-CM | POA: Diagnosis not present

## 2021-01-29 DIAGNOSIS — S82851A Displaced trimalleolar fracture of right lower leg, initial encounter for closed fracture: Secondary | ICD-10-CM | POA: Diagnosis not present

## 2021-01-29 DIAGNOSIS — S82851 Displaced trimalleolar fracture of right lower leg: Secondary | ICD-10-CM | POA: Diagnosis not present

## 2021-01-29 DIAGNOSIS — Y9289 Other specified places as the place of occurrence of the external cause: Secondary | ICD-10-CM | POA: Diagnosis not present

## 2021-02-02 ENCOUNTER — Ambulatory Visit: Payer: Self-pay | Admitting: Podiatry

## 2021-02-03 ENCOUNTER — Other Ambulatory Visit: Payer: Self-pay | Admitting: Orthopaedic Surgery

## 2021-02-03 DIAGNOSIS — S82841A Displaced bimalleolar fracture of right lower leg, initial encounter for closed fracture: Secondary | ICD-10-CM | POA: Diagnosis not present

## 2021-02-06 ENCOUNTER — Encounter (HOSPITAL_BASED_OUTPATIENT_CLINIC_OR_DEPARTMENT_OTHER): Payer: Self-pay | Admitting: Orthopaedic Surgery

## 2021-02-06 ENCOUNTER — Other Ambulatory Visit: Payer: Self-pay

## 2021-02-07 ENCOUNTER — Ambulatory Visit
Admission: RE | Admit: 2021-02-07 | Discharge: 2021-02-07 | Disposition: A | Discharge status: Home / Self Care | Payer: PPO | Source: Ambulatory Visit | Attending: Orthopaedic Surgery | Admitting: Orthopaedic Surgery

## 2021-02-07 ENCOUNTER — Other Ambulatory Visit: Payer: Self-pay | Admitting: Orthopaedic Surgery

## 2021-02-07 DIAGNOSIS — Z01818 Encounter for other preprocedural examination: Secondary | ICD-10-CM | POA: Diagnosis not present

## 2021-02-07 DIAGNOSIS — M7989 Other specified soft tissue disorders: Secondary | ICD-10-CM | POA: Diagnosis not present

## 2021-02-07 DIAGNOSIS — T148XXA Other injury of unspecified body region, initial encounter: Secondary | ICD-10-CM

## 2021-02-07 DIAGNOSIS — S82851A Displaced trimalleolar fracture of right lower leg, initial encounter for closed fracture: Secondary | ICD-10-CM | POA: Diagnosis not present

## 2021-02-13 DIAGNOSIS — S82841A Displaced bimalleolar fracture of right lower leg, initial encounter for closed fracture: Secondary | ICD-10-CM | POA: Diagnosis not present

## 2021-02-14 ENCOUNTER — Ambulatory Visit (HOSPITAL_BASED_OUTPATIENT_CLINIC_OR_DEPARTMENT_OTHER): Payer: PPO | Admitting: Anesthesiology

## 2021-02-14 ENCOUNTER — Ambulatory Visit (HOSPITAL_BASED_OUTPATIENT_CLINIC_OR_DEPARTMENT_OTHER)
Admission: RE | Admit: 2021-02-14 | Discharge: 2021-02-14 | Disposition: A | Discharge status: Home / Self Care | Payer: PPO | Source: Ambulatory Visit | Attending: Orthopaedic Surgery | Admitting: Orthopaedic Surgery

## 2021-02-14 ENCOUNTER — Ambulatory Visit (HOSPITAL_COMMUNITY): Payer: PPO

## 2021-02-14 ENCOUNTER — Encounter (HOSPITAL_BASED_OUTPATIENT_CLINIC_OR_DEPARTMENT_OTHER): Payer: Self-pay | Admitting: Orthopaedic Surgery

## 2021-02-14 ENCOUNTER — Encounter (HOSPITAL_BASED_OUTPATIENT_CLINIC_OR_DEPARTMENT_OTHER)
Admission: RE | Disposition: A | Discharge status: Home / Self Care | Payer: Self-pay | Source: Ambulatory Visit | Attending: Orthopaedic Surgery

## 2021-02-14 ENCOUNTER — Other Ambulatory Visit: Payer: Self-pay

## 2021-02-14 DIAGNOSIS — Z7951 Long term (current) use of inhaled steroids: Secondary | ICD-10-CM | POA: Insufficient documentation

## 2021-02-14 DIAGNOSIS — Z7982 Long term (current) use of aspirin: Secondary | ICD-10-CM | POA: Insufficient documentation

## 2021-02-14 DIAGNOSIS — I2510 Atherosclerotic heart disease of native coronary artery without angina pectoris: Secondary | ICD-10-CM | POA: Diagnosis not present

## 2021-02-14 DIAGNOSIS — S82854A Nondisplaced trimalleolar fracture of right lower leg, initial encounter for closed fracture: Secondary | ICD-10-CM | POA: Diagnosis not present

## 2021-02-14 DIAGNOSIS — Z8261 Family history of arthritis: Secondary | ICD-10-CM | POA: Diagnosis not present

## 2021-02-14 DIAGNOSIS — S93431A Sprain of tibiofibular ligament of right ankle, initial encounter: Secondary | ICD-10-CM | POA: Diagnosis not present

## 2021-02-14 DIAGNOSIS — K219 Gastro-esophageal reflux disease without esophagitis: Secondary | ICD-10-CM | POA: Diagnosis not present

## 2021-02-14 DIAGNOSIS — S8261XD Displaced fracture of lateral malleolus of right fibula, subsequent encounter for closed fracture with routine healing: Secondary | ICD-10-CM | POA: Diagnosis not present

## 2021-02-14 DIAGNOSIS — G8918 Other acute postprocedural pain: Secondary | ICD-10-CM | POA: Diagnosis not present

## 2021-02-14 DIAGNOSIS — Z885 Allergy status to narcotic agent status: Secondary | ICD-10-CM | POA: Insufficient documentation

## 2021-02-14 DIAGNOSIS — Z87891 Personal history of nicotine dependence: Secondary | ICD-10-CM | POA: Insufficient documentation

## 2021-02-14 DIAGNOSIS — J449 Chronic obstructive pulmonary disease, unspecified: Secondary | ICD-10-CM | POA: Insufficient documentation

## 2021-02-14 DIAGNOSIS — Z79899 Other long term (current) drug therapy: Secondary | ICD-10-CM | POA: Insufficient documentation

## 2021-02-14 DIAGNOSIS — W109XXA Fall (on) (from) unspecified stairs and steps, initial encounter: Secondary | ICD-10-CM | POA: Insufficient documentation

## 2021-02-14 DIAGNOSIS — Z825 Family history of asthma and other chronic lower respiratory diseases: Secondary | ICD-10-CM | POA: Insufficient documentation

## 2021-02-14 DIAGNOSIS — S82851A Displaced trimalleolar fracture of right lower leg, initial encounter for closed fracture: Secondary | ICD-10-CM | POA: Insufficient documentation

## 2021-02-14 DIAGNOSIS — I10 Essential (primary) hypertension: Secondary | ICD-10-CM | POA: Diagnosis not present

## 2021-02-14 DIAGNOSIS — Z419 Encounter for procedure for purposes other than remedying health state, unspecified: Secondary | ICD-10-CM

## 2021-02-14 DIAGNOSIS — Z8249 Family history of ischemic heart disease and other diseases of the circulatory system: Secondary | ICD-10-CM | POA: Diagnosis not present

## 2021-02-14 DIAGNOSIS — S93491A Sprain of other ligament of right ankle, initial encounter: Secondary | ICD-10-CM | POA: Diagnosis not present

## 2021-02-14 HISTORY — PX: ORIF ANKLE FRACTURE: SHX5408

## 2021-02-14 SURGERY — OPEN REDUCTION INTERNAL FIXATION (ORIF) ANKLE FRACTURE
Anesthesia: Regional | Site: Ankle | Laterality: Right

## 2021-02-14 MED ORDER — LACTATED RINGERS IV SOLN: INTRAVENOUS | Status: DC

## 2021-02-14 MED ORDER — MIDAZOLAM HCL 2 MG/2ML IJ SOLN
2.0000 mg | Freq: Once | INTRAMUSCULAR | Status: AC
  Administered 2021-02-14: 1 mg via INTRAVENOUS

## 2021-02-14 MED ORDER — FENTANYL CITRATE (PF) 100 MCG/2ML IJ SOLN
100.0000 ug | Freq: Once | INTRAMUSCULAR | Status: AC
  Administered 2021-02-14: 50 ug via INTRAVENOUS

## 2021-02-14 MED ORDER — LIDOCAINE HCL (CARDIAC) PF 100 MG/5ML IV SOSY
PREFILLED_SYRINGE | INTRAVENOUS | Status: DC | PRN
  Administered 2021-02-14: 40 mg via INTRATRACHEAL

## 2021-02-14 MED ORDER — GLYCOPYRROLATE 0.2 MG/ML IJ SOLN
INTRAMUSCULAR | Status: DC | PRN
  Administered 2021-02-14: .2 mg via INTRAVENOUS

## 2021-02-14 MED ORDER — DEXAMETHASONE SODIUM PHOSPHATE 10 MG/ML IJ SOLN
INTRAMUSCULAR | Status: AC
  Filled 2021-02-14: qty 1

## 2021-02-14 MED ORDER — BUPIVACAINE HCL (PF) 0.25 % IJ SOLN
INTRAMUSCULAR | Status: AC
  Filled 2021-02-14: qty 30

## 2021-02-14 MED ORDER — FENTANYL CITRATE (PF) 100 MCG/2ML IJ SOLN
INTRAMUSCULAR | Status: AC
  Filled 2021-02-14: qty 2

## 2021-02-14 MED ORDER — DEXAMETHASONE SODIUM PHOSPHATE 10 MG/ML IJ SOLN
INTRAMUSCULAR | Status: DC | PRN
  Administered 2021-02-14 (×2): 5 mg

## 2021-02-14 MED ORDER — GLYCOPYRROLATE PF 0.2 MG/ML IJ SOSY
PREFILLED_SYRINGE | INTRAMUSCULAR | Status: AC
  Filled 2021-02-14: qty 1

## 2021-02-14 MED ORDER — 0.9 % SODIUM CHLORIDE (POUR BTL) OPTIME
TOPICAL | Status: DC | PRN
  Administered 2021-02-14: 1000 mL

## 2021-02-14 MED ORDER — ROPIVACAINE HCL 5 MG/ML IJ SOLN
INTRAMUSCULAR | Status: DC | PRN
  Administered 2021-02-14: 30 mL via PERINEURAL

## 2021-02-14 MED ORDER — ACETAMINOPHEN 10 MG/ML IV SOLN: 1000.0000 mg | Freq: Once | INTRAVENOUS | Status: DC | PRN

## 2021-02-14 MED ORDER — MIDAZOLAM HCL 2 MG/2ML IJ SOLN
INTRAMUSCULAR | Status: AC
  Filled 2021-02-14: qty 2

## 2021-02-14 MED ORDER — AMISULPRIDE (ANTIEMETIC) 5 MG/2ML IV SOLN: 10.0000 mg | Freq: Once | INTRAVENOUS | Status: DC | PRN

## 2021-02-14 MED ORDER — ONDANSETRON HCL 4 MG/2ML IJ SOLN
INTRAMUSCULAR | Status: AC
  Filled 2021-02-14: qty 2

## 2021-02-14 MED ORDER — PROPOFOL 10 MG/ML IV BOLUS
INTRAVENOUS | Status: AC
  Filled 2021-02-14: qty 20

## 2021-02-14 MED ORDER — CEFAZOLIN SODIUM-DEXTROSE 2-4 GM/100ML-% IV SOLN
INTRAVENOUS | Status: AC
  Filled 2021-02-14: qty 100

## 2021-02-14 MED ORDER — EPHEDRINE 5 MG/ML INJ
INTRAVENOUS | Status: AC
  Filled 2021-02-14: qty 10

## 2021-02-14 MED ORDER — EPHEDRINE SULFATE 50 MG/ML IJ SOLN
INTRAMUSCULAR | Status: DC | PRN
  Administered 2021-02-14: 10 mg via INTRAVENOUS
  Administered 2021-02-14: 20 mg via INTRAVENOUS
  Administered 2021-02-14: 15 mg via INTRAVENOUS

## 2021-02-14 MED ORDER — PHENYLEPHRINE HCL (PRESSORS) 10 MG/ML IV SOLN
INTRAVENOUS | Status: DC | PRN
  Administered 2021-02-14 (×4): 80 ug via INTRAVENOUS

## 2021-02-14 MED ORDER — LIDOCAINE HCL (PF) 2 % IJ SOLN
INTRAMUSCULAR | Status: AC
  Filled 2021-02-14: qty 5

## 2021-02-14 MED ORDER — PROPOFOL 10 MG/ML IV BOLUS
INTRAVENOUS | Status: DC | PRN
  Administered 2021-02-14: 100 mg via INTRAVENOUS

## 2021-02-14 MED ORDER — TRAMADOL HCL 50 MG PO TABS: 100.0000 mg | ORAL_TABLET | Freq: Four times a day (QID) | ORAL | 0 refills | Status: AC | PRN

## 2021-02-14 MED ORDER — PHENYLEPHRINE 40 MCG/ML (10ML) SYRINGE FOR IV PUSH (FOR BLOOD PRESSURE SUPPORT)
PREFILLED_SYRINGE | INTRAVENOUS | Status: AC
  Filled 2021-02-14: qty 10

## 2021-02-14 MED ORDER — FENTANYL CITRATE (PF) 100 MCG/2ML IJ SOLN: 25.0000 ug | INTRAMUSCULAR | Status: DC | PRN

## 2021-02-14 MED ORDER — FENTANYL CITRATE (PF) 100 MCG/2ML IJ SOLN
INTRAMUSCULAR | Status: DC | PRN
  Administered 2021-02-14: 50 ug via INTRAVENOUS

## 2021-02-14 MED ORDER — ONDANSETRON HCL 4 MG/2ML IJ SOLN
INTRAMUSCULAR | Status: DC | PRN
  Administered 2021-02-14: 4 mg via INTRAVENOUS

## 2021-02-14 MED ORDER — CEFAZOLIN SODIUM-DEXTROSE 2-4 GM/100ML-% IV SOLN
2.0000 g | INTRAVENOUS | Status: AC
  Administered 2021-02-14: 2 g via INTRAVENOUS

## 2021-02-14 SURGICAL SUPPLY — 79 items
APL PRP STRL LF DISP 70% ISPRP (MISCELLANEOUS) ×2
APL SKNCLS STERI-STRIP NONHPOA (GAUZE/BANDAGES/DRESSINGS)
BANDAGE ESMARK 6X9 LF (GAUZE/BANDAGES/DRESSINGS) ×1 IMPLANT
BENZOIN TINCTURE PRP APPL 2/3 (GAUZE/BANDAGES/DRESSINGS) IMPLANT
BIT DRILL 2 CANN GRADUATED (BIT) ×2 IMPLANT
BIT DRILL 2.5 CANN LNG (BIT) ×2 IMPLANT
BIT DRILL 2.6 CANN (BIT) ×2 IMPLANT
BIT DRILL 3 CANN ENDOSCOPIC (BIT) ×2 IMPLANT
BLADE SURG 15 STRL LF DISP TIS (BLADE) ×2 IMPLANT
BLADE SURG 15 STRL SS (BLADE) ×12
BNDG CMPR 9X6 STRL LF SNTH (GAUZE/BANDAGES/DRESSINGS) ×1
BNDG COHESIVE 4X5 TAN STRL (GAUZE/BANDAGES/DRESSINGS) ×2 IMPLANT
BNDG ELASTIC 4X5.8 VLCR STR LF (GAUZE/BANDAGES/DRESSINGS) ×2 IMPLANT
BNDG ELASTIC 6X5.8 VLCR STR LF (GAUZE/BANDAGES/DRESSINGS) ×4 IMPLANT
BNDG ESMARK 6X9 LF (GAUZE/BANDAGES/DRESSINGS) ×3
CHLORAPREP W/TINT 26 (MISCELLANEOUS) ×5 IMPLANT
CLOSURE WOUND 1/2 X4 (GAUZE/BANDAGES/DRESSINGS)
COVER BACK TABLE 60X90IN (DRAPES) ×3 IMPLANT
COVER WAND RF STERILE (DRAPES) IMPLANT
CUFF TOURN SGL QUICK 34 (TOURNIQUET CUFF) ×3
CUFF TRNQT CYL 34X4.125X (TOURNIQUET CUFF) ×1 IMPLANT
DECANTER SPIKE VIAL GLASS SM (MISCELLANEOUS) IMPLANT
DRAPE C-ARM 42X72 X-RAY (DRAPES) ×2 IMPLANT
DRAPE C-ARMOR (DRAPES) ×2 IMPLANT
DRAPE EXTREMITY T 121X128X90 (DISPOSABLE) ×3 IMPLANT
DRAPE IMP U-DRAPE 54X76 (DRAPES) ×3 IMPLANT
DRAPE U-SHAPE 47X51 STRL (DRAPES) ×3 IMPLANT
ELECT REM PT RETURN 9FT ADLT (ELECTROSURGICAL) ×3
ELECTRODE REM PT RTRN 9FT ADLT (ELECTROSURGICAL) ×1 IMPLANT
GAUZE SPONGE 4X4 12PLY STRL (GAUZE/BANDAGES/DRESSINGS) ×3 IMPLANT
GAUZE SPONGE 4X4 12PLY STRL LF (GAUZE/BANDAGES/DRESSINGS) ×2 IMPLANT
GAUZE XEROFORM 1X8 LF (GAUZE/BANDAGES/DRESSINGS) ×3 IMPLANT
GLOVE SRG 8 PF TXTR STRL LF DI (GLOVE) ×1 IMPLANT
GLOVE SURG ENC TEXT LTX SZ7.5 (GLOVE) ×5 IMPLANT
GLOVE SURG POLYISO LF SZ6.5 (GLOVE) ×2 IMPLANT
GLOVE SURG UNDER POLY LF SZ7 (GLOVE) ×4 IMPLANT
GLOVE SURG UNDER POLY LF SZ8 (GLOVE) ×3
GOWN STRL REUS W/ TWL LRG LVL3 (GOWN DISPOSABLE) ×1 IMPLANT
GOWN STRL REUS W/ TWL XL LVL3 (GOWN DISPOSABLE) ×1 IMPLANT
GOWN STRL REUS W/TWL LRG LVL3 (GOWN DISPOSABLE) ×3
GOWN STRL REUS W/TWL XL LVL3 (GOWN DISPOSABLE) ×3
GUIDEWIRE 1.35MM (WIRE) ×4 IMPLANT
NS IRRIG 1000ML POUR BTL (IV SOLUTION) ×3 IMPLANT
PACK BASIN DAY SURGERY FS (CUSTOM PROCEDURE TRAY) ×3 IMPLANT
PAD CAST 4YDX4 CTTN HI CHSV (CAST SUPPLIES) ×1 IMPLANT
PADDING CAST ABS 6INX4YD NS (CAST SUPPLIES) ×2
PADDING CAST ABS COTTON 6X4 NS (CAST SUPPLIES) IMPLANT
PADDING CAST COTTON 4X4 STRL (CAST SUPPLIES) ×3
PENCIL SMOKE EVACUATOR (MISCELLANEOUS) ×3 IMPLANT
PLATE TITANIUM 6H RT FIB ANKLE (Plate) ×2 IMPLANT
SCREW CANN QF 4X34 (Screw) ×2 IMPLANT
SCREW CORT 3.5X18 THRD (Screw) ×2 IMPLANT
SCREW LO-PRO TI 3.5X16MM (Screw) ×2 IMPLANT
SCREW LOCK 18X3XVALOPRFL (Screw) IMPLANT
SCREW LOCKING 3.0X18 (Screw) ×6 IMPLANT
SCREW LOCKING VARIABLE 3.0X16 (Screw) ×6 IMPLANT
SCREW LP 3.5X12MM (Screw) ×2 IMPLANT
SCREW LP TI 3.5X14MM (Screw) ×2 IMPLANT
SCREW QCKFIX CANN 4.0X40MM (Screw) ×4 IMPLANT
SHEET MEDIUM DRAPE 40X70 STRL (DRAPES) ×3 IMPLANT
SLEEVE SCD COMPRESS KNEE MED (STOCKING) ×3 IMPLANT
SPONGE LAP 18X18 RF (DISPOSABLE) ×2 IMPLANT
STAPLER VISISTAT 35W (STAPLE) ×2 IMPLANT
STOCKINETTE 6  STRL (DRAPES) ×3
STOCKINETTE 6 STRL (DRAPES) ×1 IMPLANT
STRIP CLOSURE SKIN 1/2X4 (GAUZE/BANDAGES/DRESSINGS) IMPLANT
SUCTION FRAZIER HANDLE 10FR (MISCELLANEOUS) ×3
SUCTION TUBE FRAZIER 10FR DISP (MISCELLANEOUS) ×1 IMPLANT
SUT ETHILON 3 0 PS 1 (SUTURE) ×3 IMPLANT
SUT MNCRL AB 3-0 PS2 18 (SUTURE) ×5 IMPLANT
SUT PDS AB 2-0 CT2 27 (SUTURE) ×3 IMPLANT
SUT VIC AB 2-0 SH 27 (SUTURE)
SUT VIC AB 2-0 SH 27XBRD (SUTURE) IMPLANT
SUT VIC AB 3-0 FS2 27 (SUTURE) IMPLANT
SYR BULB EAR ULCER 3OZ GRN STR (SYRINGE) ×3 IMPLANT
TOWEL GREEN STERILE FF (TOWEL DISPOSABLE) ×6 IMPLANT
TUBE CONNECTING 20'X1/4 (TUBING) ×1
TUBE CONNECTING 20X1/4 (TUBING) ×2 IMPLANT
UNDERPAD 30X36 HEAVY ABSORB (UNDERPADS AND DIAPERS) ×3 IMPLANT

## 2021-02-14 NOTE — Op Note (Addendum)
Katherine Foley female 70 y.o. 02/14/2021  PreOperative Diagnosis: Right trimalleolar ankle fracture Syndesmosis disruption with fracturing of the anterior and posterior aspect of the incisura  PostOperative Diagnosis: Same  PROCEDURE: Open reduction internal fixation of right trimalleolar ankle fracture without posterior fixation Open treatment of syndesmosis Ankle stress view fluoroscopy  SURGEON: Melony Overly, MD  ASSISTANT: None  ANESTHESIA: General LMA with peripheral nerve blockade  FINDINGS: Displaced anterior incisura tibial fracture with comminuted and displaced trimalleolar ankle fracture  IMPLANTS: Arthrex distal fibular locking plate 4.0 mm partially-threaded cannulated screws x3  INDICATIONS:69 y.o. female sustained the above fracture while falling down the stairs approximately 3 weeks ago.  She presented to the office with significant of swelling and ecchymosis that she has been trying to mobilize.  She was told elevate her limb in preparation for surgery.  She presented today for surgery.  Given the amount of displacement and instability pattern of her fracture demonstrated on x-ray and CT scan she was indicated for surgery.   Patient understood the risks, benefits and alternatives to surgery which include but are not limited to wound healing complications, infection, nonunion, malunion, need for further surgery as well as damage to surrounding structures. They also understood the potential for continued pain in that there were no guarantees of acceptable outcome After weighing these risks the patient opted to proceed with surgery.  PROCEDURE:  Patient was identified in the preoperative holding area.  The right leg was marked myself.  Consent was signed by myself and the patient.  Nerve block was performed by anesthesia.  She was taken the operative suite and placed supine the operative table.  All bony prominences well-padded.  Preoperative antibiotics  were given.  Tourniquet was placed on the right thigh.  Bump was placed on the right hip and bone foam was used.  Right lower extremity was prepped and draped in the usual sterile fashion.  Surgical timeout was performed.  The limb was elevated and the tourniquet was inflated to 250 mmHg.  We began by making a longitudinal incision overlying the distal fibula.  This was taken sharply down through skin and subcutaneous tissue.  Blunt dissection was used to identify any branch of the superficial peroneal nerve which was not identified in the surgical field.  The incision was then taken sharply down to bone and the fracture site was identified.    Open treatment of syndesmosis.   The incision was then carried anteriorly under the peroneus tertius muscle belly and it was noted that there was a significantly displaced anterior incised sure piece from the tibia.  This was disruption of the syndesmosis.  There is significant mount of callus around the fracture site which was removed with a rondure and curette.  Then the site was irrigated.  We were able to peer into the ankle joint and hematoma from the ankle joint was removed.  There was no obvious injury to the talus on the lateral side.  Then the incisura fracture fragment was reduced under direct visualization and held provisionally with K wire fixation.  Fluoroscopy confirmed appropriate position of this fragment in the AP and lateral plane.  There was competent attachment of the syndesmosis ligaments to this portion of the bone.  The syndesmosis was acceptably reduced.  Then a single 4.0 mm partially-threaded cannulated screw was placed to fix the fracture and stabilize the syndesmosis.   We then turned our attention to the fibula fracture.  There is significant mount of comminution.  The incision  was carried more proximally.  Extraperiosteal dissection was carried out down to the fibula along the length of the fracture and distally.  After reduction of the  incised sure piece and syndesmosis the fibula fracture was out to length and found to be acceptably reduced.   The fracture site were cleaned with a rondure to remove robust callus formation.   Then fluoroscopy confirmed adequate reduction of the ankle mortise at that time.  The appropriate length distal fibular locking plate was chosen.  Was placed on the bone and checked with fluoroscopy.  Then a combination of locking and nonlocking screws were used.  The fracture was not amenable to lag screw fixation.  This provided good stability of the distal fibula fracture.    We then turned our attention to the medial malleolus.  There was minimal displacement of the medial malleolus and the fracture was amenable to percutaneous screw fixation.  2 K wires were placed one within the anterior colliculus and 1 within the interclavicular groove of the medial malleolar fragment.  Fluoroscopy confirmed appropriate placement of the K wires.  Then through percutaneous stab incisions to 4.0 mm partially-threaded cannulated screws were placed across the fracture site with good fixation.    Then under fluoroscopy the ankle was stressed and found to be stable with regard to medial clear space widening or syndesmotic widening.  The wounds were irrigated and the subcuticular tissue was closed with 3-0 Monocryl and the skin with staples.  Xeroform was placed on the wounds as well as 4 x 4's and sterile sheet cotton.  The tourniquet was released.    Patient was placed in a tall cam walker boot.  Patient tolerated the procedure well.  There were no complications.  Patient was awakened from anesthesia and taken recovery in stable condition.  POST OPERATIVE INSTRUCTIONS: Nonweightbearing on operative extremity Keep splint dry and limb elevated Continue 325 mg aspirin for DVT prophylaxis Call the office with concerns Follow-up in 2 weeks for splint removal, x-rays of the operative ankle, nonweightbearing and suture removal if  appropriate.    TOURNIQUET TIME: 58 minutes  BLOOD LOSS:  Minimal         DRAINS: none         SPECIMEN: none       COMPLICATIONS:  * No complications entered in OR log *         Disposition: PACU - hemodynamically stable.         Condition: stable

## 2021-02-14 NOTE — Anesthesia Preprocedure Evaluation (Addendum)
Anesthesia Evaluation  Patient identified by MRN, date of birth, ID band Patient awake    Reviewed: Allergy & Precautions, NPO status , Patient's Chart, lab work & pertinent test results  Airway Mallampati: II  TM Distance: >3 FB Neck ROM: Full    Dental  (+) Upper Dentures, Lower Dentures   Pulmonary COPD,  COPD inhaler, former smoker,    Pulmonary exam normal        Cardiovascular hypertension, Pt. on medications and Pt. 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 Bowel sounds: normal.  Peds  Hematology negative hematology ROS (+)   Anesthesia Other Findings   Reproductive/Obstetrics                            Anesthesia Physical Anesthesia Plan  ASA: 3  Anesthesia Plan: General and Regional   Post-op Pain Management:  Regional for Post-op pain   Induction: Intravenous  PONV Risk Score and Plan: 3 and Ondansetron, Dexamethasone, Midazolam and Treatment may vary due to age or medical condition  Airway Management Planned: Mask and LMA  Additional Equipment: None  Intra-op Plan:   Post-operative Plan: Extubation in OR  Informed Consent: I have reviewed the patients History and Physical, chart, labs and discussed the procedure including the risks, benefits and alternatives for the proposed anesthesia with the patient or authorized representative who has indicated his/her understanding and acceptance.     Dental advisory given  Plan Discussed with: CRNA  Anesthesia Plan Comments: (ECHO 09/19: - Left ventricle: The cavity size was normal. There was mild   concentric hypertrophy. Systolic function was normal. The  estimated ejection fraction was in the range of 60% to 65%. Wall  motion was normal; there were no regional wall motion  abnormalities. Doppler parameters are consistent with abnormal  left ventricular relaxation (grade 1 diastolic dysfunction).  There was no evidence of elevated ventricular filling pressure by  Doppler parameters.  - Aortic valve: S/p AVR (63mm [facility] Ease pericardial  valve). Transvalvular velocity was within the normal range. There  was no stenosis. There was no significant regurgitation. Peak  gradient (S): 21 mm Hg.  - Mitral valve: There was trivial regurgitation.  - Right ventricle: The cavity size was normal. Wall thickness was  normal. Systolic function was normal.  - Tricuspid valve: There was mild regurgitation.  - Pulmonary arteries: Systolic pressure was within the normal  range.  - Inferior vena cava: The vessel was normal in size. )      Anesthesia Quick Evaluation

## 2021-02-14 NOTE — Anesthesia Procedure Notes (Signed)
Anesthesia Regional Block: Popliteal block   Pre-Anesthetic Checklist: , timeout performed,  Correct Patient, Correct Site, Correct Laterality,  Correct Procedure, Correct Position, site marked,  Risks and benefits discussed,  Surgical consent,  Pre-op evaluation,  At surgeon's request and post-op pain management  Laterality: Right  Prep: Dura Prep       Needles:  Injection technique: Single-shot  Needle Type: Echogenic Stimulator Needle     Needle Length: 10cm  Needle Gauge: 20     Additional Needles:   Procedures:,,,, ultrasound used (permanent image in chart),,    Narrative:  Start time: 02/14/2021 9:51 AM End time: 02/14/2021 9:54 AM Injection made incrementally with aspirations every 5 mL.  Performed by: Personally  Anesthesiologist: Darral Dash, DO  Additional Notes: Patient identified. Risks/Benefits/Options discussed with patient including but not limited to bleeding, infection, nerve damage, failed block, incomplete pain control. Patient expressed understanding and wished to proceed. All questions were answered. Sterile technique was used throughout the entire procedure. Please see nursing notes for vital signs. Aspirated in 5cc intervals with injection for negative confirmation. Patient was given instructions on fall risk and not to get out of bed. All questions and concerns addressed with instructions to call with any issues or inadequate analgesia.

## 2021-02-14 NOTE — Progress Notes (Signed)
Assisted Dr. Jana Half with right, ultrasound guided, popliteal, popliteal/saphenous block. Side rails up, monitors on throughout procedure. See vital signs in flow sheet. Tolerated Procedure well.

## 2021-02-14 NOTE — Anesthesia Procedure Notes (Signed)
Procedure Name: LMA Insertion Date/Time: 02/14/2021 10:37 AM Performed by: Glory Buff, CRNA Pre-anesthesia Checklist: Patient identified, Emergency Drugs available, Suction available and Patient being monitored Patient Re-evaluated:Patient Re-evaluated prior to induction Oxygen Delivery Method: Circle system utilized Preoxygenation: Pre-oxygenation with 100% oxygen Induction Type: IV induction LMA: LMA inserted LMA Size: 4.0 Number of attempts: 1 Placement Confirmation: positive ETCO2 Tube secured with: Tape Dental Injury: Teeth and Oropharynx as per pre-operative assessment

## 2021-02-14 NOTE — H&P (Signed)
PREOPERATIVE H&P  Chief Complaint: Right trimalleolar ankle fracture with syndesmosis disruption  HPI: Katherine Foley is a 70 y.o. female who presents for preoperative history and physical with a diagnosis of right trimalleolar ankle fracture with syndesmosis disruption.  She had a fall down some stairs approximately 3 weeks ago.  She was seen in the office but due to significant amount of swelling she was instructed to elevate her limb for delayed open treatment.  She also had CT scan performed which demonstrated disruption of her syndesmosis with fracture of the anterior and posterior aspect of the incisura.  There was displacement of the anterior portion.  She is here today for surgical correction. Symptoms are rated as moderate to severe, and have been worsening.  This is significantly impairing activities of daily living.  She has elected for surgical management.   Past Medical History:  Diagnosis Date   Adenomatous colon polyp    Anxiety    ANXIETY DEPRESSION 02/15/2009   AORTIC STENOSIS 07/07/2010   a. echo 12/23/10: EF 60-65%, mod to severe AS with mean gradient 29 mmHg;  b. 04/2011 s/p bioprosthetic AVR. // Echo 9/19:  Mild conc LVH, EF 60-65, no RWMA, Gr 1 DD, AVR ok (mean 10, peak 21), trivial MR, normal RVSF, mild TR    Barrett's esophagus 04/14/2007   Barrett's esophagus 2000   CAD, NATIVE VESSEL 05/17/2010   a. NSTEMI 8/11 - BMS to RCA;  b. cath 4/12: dLAD 50%, RI 80% (PCI), AVCFX 30%, pRCA 30%, stent ok, 40-50, then 50% dist RCA;  c. s/p Promus DES to RI 12/25/10;  d. 04/2011 CABG x 1 (LIMA->LAD) @ time of AVR   Carotid stenosis 05/17/2010   a. dopplers 68/12: RICA 75-17%; LICA 0-01% (follow up due 11/12)   Chronic bronchitis ([place])    "multiple times" (03/25/2013)   CHRONIC OBSTRUCTIVE PULMONARY DISEASE, MILD 07/23/2007   Exertional shortness of breath    "related to COPD problems" (03/25/2013)   Family history of breast cancer    2 daughters; PALB2 mutation in family    Genetic testing 02/10/2018   PALB2 analysis at [facility] - Negative for familial mutation   GERD (gastroesophageal reflux disease)    H/O hiatal hernia    Hemorrhoid thrombosis    "had it lanced" (03/25/2013)   Herpes simplex without mention of complication 74/94/4967   History of blood transfusion    'w/OHS" (03/25/2013)   HYPERLIPIDEMIA, MILD 08/29/2009   HYPERTENSION 04/14/2007   MIGRAINE HEADACHE 04/20/2008   Myocardial infarction ([place]) 2011   "during catherization" (03/25/2013)   OSTEOPENIA 04/14/2007   Ovarian cyst    Parotid mass    left   Pneumonia    "multiple times" (03/25/2013)   URINARY INCONTINENCE, STRESS, MILD 08/26/2007   Urine, incontinence, stress female    Past Surgical History:  Procedure Laterality Date   AORTIC VALVE REPLACEMENT  2012   APPENDECTOMY     BLEPHAROPLASTY Bilateral ~ [place] N/A 07/06/2016   Procedure: Left Heart Cath and Cors/Grafts Angiography;  Surgeon: Sherren Mocha, MD;  Location: [place] [facility];  Service: Cardiovascular;  Laterality: N/A;   CHOLECYSTECTOMY N/A 03/25/2013   Procedure: LAPAROSCOPIC CHOLECYSTECTOMY WITH INTRAOPERATIVE CHOLANGIOGRAM;  Surgeon: Imogene Burn. Georgette Dover, MD;  Location: [place];  Service: General;  Laterality: N/A;   CORONARY ANGIOGRAPHY N/A 05/26/2018   Procedure: CORONARY ANGIOGRAPHY (CATH LAB);  Surgeon: Jettie Booze, MD;  Location: [facility];  Service: Cardiovascular;  Laterality: N/A;   CORONARY  ANGIOPLASTY WITH STENT PLACEMENT  2010   "I have 2 stents" (03/25/2013)   [place]; 1998   "cust in maxillary sinus; put a stent in then removed it" (03/25/2013)   OVARIAN CYST REMOVAL Right 2011   "size of a soccer ball" (03/25/2013)   PAROTIDECTOMY Left 04/28/2019   Procedure: LEFT TOTAL PAROTIDECTOMY;  Surgeon: Leta Baptist, MD;  Location: [place];  Service: ENT;  Laterality: Left;   thrombosed hemorrohid lanced     TONSILLECTOMY AND ADENOIDECTOMY      TOTAL ABDOMINAL HYSTERECTOMY     Social History   Socioeconomic History   Marital status: Single    Spouse name: Not on file   Number of children: 3   Years of education: Not on file   Highest education level: Not on file  Occupational History   Occupation: Clinical research associate    Employer: [email or web address]   Occupation: retired  Tobacco Use   Smoking status: Former    Packs/day: 1.50    Years: 44.00    Pack years: 66.00    Types: Cigarettes    Quit date: 09/03/2005    Years since quitting: 15.4   Smokeless tobacco: Never  Vaping Use   Vaping Use: Never used  Substance and Sexual Activity   Alcohol use: Not Currently    Alcohol/week: 0.0 standard drinks    Comment: social   Drug use: No   Sexual activity: Not Currently    Birth control/protection: Post-menopausal, Surgical  Other Topics Concern   Not on file  Social History Narrative   Lost fiancee of 22 years in 2022. 3 children (2 living). 6 grandkids. 2 greatgrandkids. Keeps greatgrandson on tuesdays- loves her so much.       Retired from [employer] (parttime- Dec 12)   Faith is very important to her      Hobbies: reading, cross stich, sew. Wants to learn to knit.       Social Determinants of Health   Financial Resource Strain: Low Risk    Difficulty of Paying Living Expenses: Not hard at all  Food Insecurity: No Food Insecurity   Worried About Charity fundraiser in the Last Year: Never true   [place] in the Last Year: Never true  Transportation Needs: No Transportation Needs   Lack of Transportation (Medical): No   Lack of Transportation (Non-Medical): No  Physical Activity: Sufficiently Active   Days of Exercise per Week: 7 days   Minutes of Exercise per Session: 60 min  Stress: Stress Concern Present   Feeling of Stress : Rather much  Social Connections: Moderately Isolated   Frequency of Communication with Friends and Family: More than three times a week   Frequency of Social Gatherings with Friends and Family: Three  times a week   Attends Religious Services: More than 4 times per year   Active Member of Clubs or Organizations: No   Attends Archivist Meetings: Never   Marital Status: Never married   Family History  Problem Relation Age of Onset   Coronary artery disease Other    Heart disease Other        6 stents   Heart disease Mother    COPD Father        smoked   Heart disease Father    Rheum arthritis Father    Heart disease Maternal Uncle    Breast cancer Daughter        currently 74   Asthma  Son        as a child    Breast cancer Daughter 66       currently 9; PALB2 mutation   Cancer Other        very distant paternal cousin; pancreatic ca   Cirrhosis Sister    Stroke Brother    Colon cancer Neg Hx    Colon polyps Neg Hx    Allergies  Allergen Reactions   Codeine Sulfate Itching and Rash   Hydrocodone-Acetaminophen Itching and Rash   Prior to Admission medications   Medication Sig Start Date End Date Taking? Authorizing Provider  Albuterol Sulfate (PROAIR RESPICLICK) 861 (90 Base) MCG/ACT AEPB Inhale 1 puff into the lungs every 6 (six) hours as needed (wheezing or shortness of breath). 12/23/20  Yes Marin Olp, MD  amLODipine (NORVASC) 5 MG tablet Take 1 tablet (5 mg total) by mouth daily. 09/20/20  Yes Marin Olp, MD  aspirin EC 81 MG tablet Take 1 tablet (81 mg total) by mouth daily. 06/17/19  Yes Weaver, Scott T, PA-C  buPROPion (WELLBUTRIN XL) 300 MG 24 hr tablet Take 1 tablet (300 mg total) by mouth daily. 01/20/21  Yes Marin Olp, MD  cetirizine (ZYRTEC) 10 MG tablet Take 10 mg by mouth at bedtime.   Yes [provider]  desvenlafaxine (PRISTIQ) 100 MG 24 hr tablet Take 1 tablet (100 mg total) by mouth daily. 09/14/20  Yes Marin Olp, MD  dexlansoprazole (DEXILANT) 60 MG capsule Take 1 capsule (60 mg total) by mouth daily. 09/20/20  Yes Marin Olp, MD  ezetimibe (ZETIA) 10 MG tablet Take 1 tablet (10 mg total) by mouth  daily. 09/28/20  Yes Richardson Dopp T, PA-C  fluticasone (FLONASE) 50 MCG/ACT nasal spray Use 2 spray(s) in each nostril once daily 02/17/20  Yes Marin Olp, MD  gabapentin (NEURONTIN) 300 MG capsule Take 1 capsule by mouth once daily at bedtime 09/20/20  Yes Marin Olp, MD  LORazepam (ATIVAN) 0.5 MG tablet TAKE ONE TABLET BY MOUTH TWICE DAILY AS NEEDED FOR ANXIETY. DON'T DRIVE EIGHT HOURS AFTER USING 01/20/21  Yes Marin Olp, MD  Multiple Vitamins-Minerals (MULTIVITAMIN ADULT PO) Take 1 tablet by mouth daily.    Yes [provider]  propranolol (INDERAL) 20 MG tablet Take 1 tablet (20 mg total) by mouth 2 (two) times daily. 09/20/20  Yes Marin Olp, MD  rosuvastatin (CRESTOR) 40 MG tablet Take 1 tablet (40 mg total) by mouth daily. 09/20/20  Yes Marin Olp, MD  SPIRIVA HANDIHALER 18 MCG inhalation capsule Place 1 capsule (18 mcg total) into inhaler and inhale 2 (two) times daily. 09/20/20  Yes Marin Olp, MD  SYMBICORT 160-4.5 MCG/ACT inhaler Inhale 2 puffs into the lungs 2 (two) times daily. 01/11/21  Yes Marin Olp, MD  COLLODION FLEXIBLE EX Apply topically. Patient not taking: No sig reported    [provider]  nitroGLYCERIN (NITROSTAT) 0.4 MG SL tablet Place 1 tablet (0.4 mg total) under the tongue every 5 (five) minutes as needed for chest pain (if pain does not resolve with 2 doses, call 911). Patient not taking: Reported on 01/20/2021 11/09/19   Marin Olp, MD  valACYclovir (VALTREX) 1000 MG tablet Take 2 pills twice a day for 1 day at first sign of cold sore 10/28/20   Marin Olp, MD  Aclidinium Bromide (TUDORZA PRESSAIR) 400 MCG/ACT AEPB Inhale 1 puff into the lungs 2 (two) times daily. 09/11/11 06/16/18  Ricard Dillon, MD     Positive ROS: All other systems have been reviewed and were otherwise negative with the exception of those mentioned in the HPI and as above.  Physical Exam:  Vitals:   02/14/21 0955 02/14/21  1000  BP: 109/65 103/67  Pulse: (!) 56 (!) 56  Resp: 11 12  Temp:    SpO2: 98% 98%   General: Alert, no acute distress Cardiovascular: No pedal edema Respiratory: No cyanosis, no use of accessory musculature GI: No organomegaly, abdomen is soft and non-tender Skin: No lesions in the area of chief complaint Neurologic: Sensation intact distally Psychiatric: Patient is competent for consent with normal mood and affect Lymphatic: No axillary or cervical lymphadenopathy  MUSCULOSKELETAL: Right leg demonstrates improvement in swelling.  Ecchymosis medially and laterally about the ankle and foot.  Tender to palpation laterally medially about the ankle.  Not assessed stability.  Alignment overall is well-maintained.  No open wounds.  Did not assess motor strength.  Sensation grossly intact in the dorsal and plantar foot with palpable dorsalis pedis pulse.  Assessment: Right trimalleolar ankle fracture with syndesmosis disruption due to fracturing of the incisura   Plan: Plan for open treatment of her ankle without posterior fixation but will address the anterior portion of the incisura and syndesmosis.  We discussed the risks, benefits and alternatives of surgery which include but are not limited to wound healing complications, infection, nonunion, malunion, need for further surgery, damage to surrounding structures and continued pain.  They understand there is no guarantees to an acceptable outcome.  After weighing these risks they opted to proceed with surgery.     Erle Crocker, MD    02/14/2021 10:14 AM

## 2021-02-14 NOTE — Transfer of Care (Signed)
Immediate Anesthesia Transfer of Care Note  Patient: Corayma Cashatt  Procedure(s) Performed: OPEN TREATMENT OF RIGHT TRIMALLEOLAR ANKLE FRACTURE WITHOUT POSTERIOR FIXATION, SYNDESMOSIS (Right: Ankle)  Patient Location: PACU  Anesthesia Type:General  Level of Consciousness: drowsy, patient cooperative and responds to stimulation  Airway & Oxygen Therapy: Patient Spontanous Breathing and Patient connected to face mask oxygen  Post-op Assessment: Report given to RN and Post -op Vital signs reviewed and stable  Post vital signs: Reviewed and stable  Last Vitals:  Vitals Value Taken Time  BP 106/90 02/14/21 1200  Temp    Pulse 81 02/14/21 1200  Resp 13 02/14/21 1200  SpO2 100 % 02/14/21 1200  Vitals shown include unvalidated device data.  Last Pain:  Vitals:   02/14/21 0844  TempSrc: Oral  PainSc: 0-No pain      Patients Stated Pain Goal: 3 (22/48/25 0037)  Complications: No notable events documented.

## 2021-02-14 NOTE — Discharge Instructions (Addendum)
DR. Lucia Gaskins FOOT & ANKLE SURGERY POST-OP INSTRUCTIONS   Pain Management The numbing medicine and your leg will last around 18 hours, take a dose of your pain medicine as soon as you feel it wearing off to avoid rebound pain. Keep your foot elevated above heart level.  Make sure that your heel hangs free ('floats'). Take all prescribed medication as directed. If taking narcotic pain medication you may want to use an over-the-counter stool softener to avoid constipation. You may take over-the-counter NSAIDs (ibuprofen, naproxen, etc.) as well as over-the-counter acetaminophen as directed on the packaging as a supplement for your pain and may also use it to wean away from the prescription medication.  Activity Non-weightbearing   First Postoperative Visit Your first postop visit will be at least 2 weeks after surgery.  This should be scheduled when you schedule surgery. If you do not have a postoperative visit scheduled please call [phone] to schedule an appointment. At the appointment your incision will be evaluated for suture removal, x-rays will be obtained if necessary.  General Instructions Swelling is very common after foot and ankle surgery.  It often takes 3 months for the foot and ankle to begin to feel comfortable.  Some amount of swelling will persist for 6-12 months. DO NOT change the dressing.  If there is a problem with the dressing (too tight, loose, gets wet, etc.) please contact Dr. Pollie Friar office. DO NOT get the dressing wet.  For showers you can use an over-the-counter cast cover or wrap a washcloth around the top of your dressing and then cover it with a plastic bag and tape it to your leg. DO NOT soak the incision (no tubs, pools, bath, etc.) until you have approval from Dr. Lucia Gaskins.  Contact Dr. Huel Cote office or go to Emergency Room if: Temperature above 101 F. Increasing pain that is unresponsive to pain medication or elevation Excessive redness or swelling in your  foot Dressing problems - excessive bloody drainage, looseness or tightness, or if dressing gets wet Develop pain, swelling, warmth, or discoloration of your calf   Post Anesthesia Home Care Instructions  Activity: Get plenty of rest for the remainder of the day. A responsible individual must stay with you for 24 hours following the procedure.  For the next 24 hours, DO NOT: -Drive a car -Paediatric nurse -Drink alcoholic beverages -Take any medication unless instructed by your physician -Make any legal decisions or sign important papers.  Meals: Start with liquid foods such as gelatin or soup. Progress to regular foods as tolerated. Avoid greasy, spicy, heavy foods. If nausea and/or vomiting occur, drink only clear liquids until the nausea and/or vomiting subsides. Call your physician if vomiting continues.  Special Instructions/Symptoms: Your throat may feel dry or sore from the anesthesia or the breathing tube placed in your throat during surgery. If this causes discomfort, gargle with warm salt water. The discomfort should disappear within 24 hours.  If you had a scopolamine patch placed behind your ear for the management of post- operative nausea and/or vomiting:  1. The medication in the patch is effective for 72 hours, after which it should be removed.  Wrap patch in a tissue and discard in the trash. Wash hands thoroughly with soap and water. 2. You may remove the patch earlier than 72 hours if you experience unpleasant side effects which may include dry mouth, dizziness or visual disturbances. 3. Avoid touching the patch. Wash your hands with soap and water after contact with the patch.

## 2021-02-14 NOTE — Anesthesia Postprocedure Evaluation (Signed)
Anesthesia Post Note  Patient: Katherine Foley  Procedure(s) Performed: OPEN TREATMENT OF RIGHT TRIMALLEOLAR ANKLE FRACTURE WITHOUT POSTERIOR FIXATION, SYNDESMOSIS (Right: Ankle)     Patient location during evaluation: PACU Anesthesia Type: Regional and General Level of consciousness: awake and alert Pain management: pain level controlled Vital Signs Assessment: post-procedure vital signs reviewed and stable Respiratory status: spontaneous breathing, nonlabored ventilation, respiratory function stable and patient connected to nasal cannula oxygen Cardiovascular status: blood pressure returned to baseline and stable Postop Assessment: no apparent nausea or vomiting Anesthetic complications: no   No notable events documented.  Last Vitals:  Vitals:   02/14/21 1230 02/14/21 1254  BP: 97/65 90/63  Pulse: 73 80  Resp: 13   Temp:  36.5 C  SpO2: 95% 97%    Last Pain:  Vitals:   02/14/21 1254  TempSrc:   PainSc: 0-No pain        RLE Motor Response: Purposeful movement;Responds to commands (02/14/21 1252) RLE Sensation: Numbness (02/14/21 1252)      Belenda Cruise P Troyce Febo

## 2021-02-14 NOTE — Anesthesia Procedure Notes (Signed)
Anesthesia Regional Block: Adductor canal block   Pre-Anesthetic Checklist: , timeout performed,  Correct Patient, Correct Site, Correct Laterality,  Correct Procedure, Correct Position, site marked,  Risks and benefits discussed,  Surgical consent,  Pre-op evaluation,  At surgeon's request and post-op pain management  Laterality: Right  Prep: Dura Prep       Needles:  Injection technique: Single-shot  Needle Type: Echogenic Stimulator Needle     Needle Length: 10cm  Needle Gauge: 20     Additional Needles:   Procedures:,,,, ultrasound used (permanent image in chart),,    Narrative:  Start time: 02/14/2021 9:48 AM End time: 02/14/2021 9:50 AM Injection made incrementally with aspirations every 5 mL.  Performed by: Personally  Anesthesiologist: Darral Dash, DO  Additional Notes: Patient identified. Risks/Benefits/Options discussed with patient including but not limited to bleeding, infection, nerve damage, failed block, incomplete pain control. Patient expressed understanding and wished to proceed. All questions were answered. Sterile technique was used throughout the entire procedure. Please see nursing notes for vital signs. Aspirated in 5cc intervals with injection for negative confirmation. Patient was given instructions on fall risk and not to get out of bed. All questions and concerns addressed with instructions to call with any issues or inadequate analgesia.

## 2021-02-15 ENCOUNTER — Encounter (HOSPITAL_BASED_OUTPATIENT_CLINIC_OR_DEPARTMENT_OTHER): Payer: Self-pay | Admitting: Orthopaedic Surgery

## 2021-02-17 ENCOUNTER — Other Ambulatory Visit: Payer: Self-pay | Admitting: Family Medicine

## 2021-02-20 ENCOUNTER — Telehealth: Payer: Self-pay

## 2021-02-20 NOTE — Chronic Care Management (AMB) (Signed)
Chronic Care Management Pharmacy Assistant   Name: Katherine Foley  MRN: [id number] DOB: 06-20-1951  Reason for Encounter: Medication Coordination Call  Recent office visits:  01/20/21- Katherine Reddish, MD- seen for recurrent major depressive disorder, increased bupropion from 150 mg daily to 300 mg daily, visit noted short course prednisone 20 mg x 5 DS , follow up 2 months  12/23/20- Katherine Reddish, MD- seen for severe episode of recurrent major depressive disorder, started bupropion 150 mg, follow up 4 weeks   Recent consult visits:  01/15/21 Katherine Ben, NP ([facility])- seen for left wrist problem, no medication changes, no follow up documented   Hospital visits:  Medication Reconciliation was completed by comparing discharge summary, patient's EMR and Pharmacy list, and upon discussion with patient.  Same day discharge from [facility] on 02/14/21 for open treatment of right trimalleolar ankle fracture without posterior fixation, syndesmosis  New?Medications Started at [facility] Discharge:?? started tramadol  All other medications remain the same after Hospital Discharge:??   2. Same day discharge on 01/29/21 due to closed trimalleolar fracture of right ankle. Discharged from [place] at [facility]    All medications  remain the same after Hospital Discharge   Medications: Outpatient Encounter Medications as of 02/20/2021  Medication Sig   Albuterol Sulfate (PROAIR RESPICLICK) 315 (90 Base) MCG/ACT AEPB Inhale 1 puff into the lungs every 6 (six) hours as needed (wheezing or shortness of breath).   amLODipine (NORVASC) 5 MG tablet Take 1 tablet (5 mg total) by mouth daily.   aspirin EC 81 MG tablet Take 1 tablet (81 mg total) by mouth daily.   buPROPion (WELLBUTRIN XL) 300 MG 24 hr tablet Take 1 tablet (300 mg total) by mouth daily.   cetirizine (ZYRTEC) 10 MG tablet Take 10 mg by mouth at bedtime.    COLLODION FLEXIBLE EX Apply topically. (Patient not taking: No sig reported)   desvenlafaxine (PRISTIQ) 100 MG 24 hr tablet Take 1 tablet (100 mg total) by mouth daily.   dexlansoprazole (DEXILANT) 60 MG capsule Take 1 capsule (60 mg total) by mouth daily.   ezetimibe (ZETIA) 10 MG tablet Take 1 tablet (10 mg total) by mouth daily.   fluticasone (FLONASE) 50 MCG/ACT nasal spray Use 2 spray(s) in each nostril once daily   gabapentin (NEURONTIN) 300 MG capsule TAKE ONE CAPSULE BY MOUTH EVERYDAY AT BEDTIME   LORazepam (ATIVAN) 0.5 MG tablet TAKE ONE TABLET BY MOUTH TWICE DAILY AS NEEDED FOR ANXIETY. DON'T DRIVE EIGHT HOURS AFTER USING   Multiple Vitamins-Minerals (MULTIVITAMIN ADULT PO) Take 1 tablet by mouth daily.    nitroGLYCERIN (NITROSTAT) 0.4 MG SL tablet Place 1 tablet (0.4 mg total) under the tongue every 5 (five) minutes as needed for chest pain (if pain does not resolve with 2 doses, call 911). (Patient not taking: Reported on 01/20/2021)   propranolol (INDERAL) 20 MG tablet Take 1 tablet (20 mg total) by mouth 2 (two) times daily.   rosuvastatin (CRESTOR) 40 MG tablet Take 1 tablet (40 mg total) by mouth daily.   SPIRIVA HANDIHALER 18 MCG inhalation capsule Place 1 capsule (18 mcg total) into inhaler and inhale 2 (two) times daily.   SYMBICORT 160-4.5 MCG/ACT inhaler Inhale 2 puffs into the lungs 2 (two) times daily.   traMADol (ULTRAM) 50 MG tablet Take 2 tablets (100 mg total) by mouth every 6 (six) hours as needed for up to 7 days.   valACYclovir (VALTREX)  1000 MG tablet Take 2 pills twice a day for 1 day at first sign of cold sore   [DISCONTINUED] Aclidinium Bromide (TUDORZA PRESSAIR) 400 MCG/ACT AEPB Inhale 1 puff into the lungs 2 (two) times daily.   No facility-administered encounter medications on file as of 02/20/2021.   Reviewed chart for medication changes ahead of medication coordination call.  No OVs, Consults, or hospital visits since last care coordination call/Pharmacist  visit. (If appropriate, list visit date, provider name)  No medication changes indicated OR if recent visit, treatment plan here.  BP Readings from Last 3 Encounters:  02/14/21 (!) 106/58  01/20/21 124/72  12/23/20 126/66    Lab Results  Component Value Date   HGBA1C 6.2 (H) 04/27/2011    [name] broke and fractured her ankle on [place] Day and has been in a boot ever since. She has not been able to walk for the past month and has passing pains that only worsen when she rolls over at night. The pain is bearable and doesn't require her to take the tramadol that she was prescribed or any other pain medication.   Patient stated she has not been receiving her symbicort through patient assistance despite being approved. Upon calling, I was informed that some information was missing. A new prescription was completed for submission to [place] [place], patient assistance program for Symbicort, in order to complete the application process.   Patient obtains medications through [facility]  90 Days   Last adherence delivery included:  Gabapentin 300 mg Take one capsule by mouth everyday at bedtime Ezetimibe 10 mg Tab Take one tab by mouth once daily Dexlansoprazole 60 mg Cap Take one capsule once daily Lorazepam 0.5mg  Take one tab by mouth twice daily as needed for anxiety Propranolol 20 mg tab Take one tab by mouth twice daily    Patient declined due to not needing last delivery  Desvenlafaxine Succnt Er 100 mg  Amlodipine 5 mg Tab     Patient is due for next adherence delivery on: 02/28/21. Called patient and reviewed medications and coordinated delivery.  This delivery to include: Gabapentin 300 mg Take one capsule by mouth everyday at bedtime Ezetimibe 10 mg Tab Take one tab by mouth once daily Dexlansoprazole 60 mg Cap Take one capsule once daily Propranolol 20 mg tab Take one tab by mouth twice daily  Bupropion 300 mg- Take one tab by mouth once daily Desvenlafaxine 100 mg-Take one tab by  mouth once daily Rosuvastatin 40 mg- Take one tab by mouth once daily  Confirmed delivery date of 02/28/21, advised patient that pharmacy will contact them the morning of delivery.  Wilford Sports CPA, CMA

## 2021-02-27 DIAGNOSIS — S82841A Displaced bimalleolar fracture of right lower leg, initial encounter for closed fracture: Secondary | ICD-10-CM | POA: Diagnosis not present

## 2021-03-02 NOTE — Chronic Care Management (AMB) (Signed)
    Chronic Care Management Pharmacy Assistant   Name: Katherine Foley  MRN: [id number] DOB: 1951/08/27  Reason for Encounter: Chart Review   Medications: Outpatient Encounter Medications as of 02/20/2021  Medication Sig   Albuterol Sulfate (PROAIR RESPICLICK) 629 (90 Base) MCG/ACT AEPB Inhale 1 puff into the lungs every 6 (six) hours as needed (wheezing or shortness of breath).   amLODipine (NORVASC) 5 MG tablet Take 1 tablet (5 mg total) by mouth daily.   aspirin EC 81 MG tablet Take 1 tablet (81 mg total) by mouth daily.   buPROPion (WELLBUTRIN XL) 300 MG 24 hr tablet Take 1 tablet (300 mg total) by mouth daily.   cetirizine (ZYRTEC) 10 MG tablet Take 10 mg by mouth at bedtime.   COLLODION FLEXIBLE EX Apply topically. (Patient not taking: No sig reported)   desvenlafaxine (PRISTIQ) 100 MG 24 hr tablet Take 1 tablet (100 mg total) by mouth daily.   dexlansoprazole (DEXILANT) 60 MG capsule Take 1 capsule (60 mg total) by mouth daily.   ezetimibe (ZETIA) 10 MG tablet Take 1 tablet (10 mg total) by mouth daily.   fluticasone (FLONASE) 50 MCG/ACT nasal spray Use 2 spray(s) in each nostril once daily   gabapentin (NEURONTIN) 300 MG capsule TAKE ONE CAPSULE BY MOUTH EVERYDAY AT BEDTIME   LORazepam (ATIVAN) 0.5 MG tablet TAKE ONE TABLET BY MOUTH TWICE DAILY AS NEEDED FOR ANXIETY. DON'T DRIVE EIGHT HOURS AFTER USING   Multiple Vitamins-Minerals (MULTIVITAMIN ADULT PO) Take 1 tablet by mouth daily.    nitroGLYCERIN (NITROSTAT) 0.4 MG SL tablet Place 1 tablet (0.4 mg total) under the tongue every 5 (five) minutes as needed for chest pain (if pain does not resolve with 2 doses, call 911). (Patient not taking: Reported on 01/20/2021)   propranolol (INDERAL) 20 MG tablet Take 1 tablet (20 mg total) by mouth 2 (two) times daily.   rosuvastatin (CRESTOR) 40 MG tablet Take 1 tablet (40 mg total) by mouth daily.   SPIRIVA HANDIHALER 18 MCG inhalation capsule Place 1 capsule (18 mcg total) into inhaler  and inhale 2 (two) times daily.   SYMBICORT 160-4.5 MCG/ACT inhaler Inhale 2 puffs into the lungs 2 (two) times daily.   [EXPIRED] traMADol (ULTRAM) 50 MG tablet Take 2 tablets (100 mg total) by mouth every 6 (six) hours as needed for up to 7 days.   valACYclovir (VALTREX) 1000 MG tablet Take 2 pills twice a day for 1 day at first sign of cold sore   [DISCONTINUED] Aclidinium Bromide (TUDORZA PRESSAIR) 400 MCG/ACT AEPB Inhale 1 puff into the lungs 2 (two) times daily.   No facility-administered encounter medications on file as of 02/20/2021.   Reviewed chart for medication changes and adherence.  No OVs, Consults, or hospital visits since last care coordination call / Pharmacist visit. No medication changes indicated  No gaps in adherence identified. Patient has follow up scheduled with pharmacy team. No further action required.  Wilford Sports CPA, CMA

## 2021-03-10 ENCOUNTER — Telehealth: Payer: Self-pay

## 2021-03-10 NOTE — Chronic Care Management (AMB) (Signed)
    Chronic Care Management Pharmacy Assistant   Name: Katherine Foley  MRN: [id number] DOB: 08/03/1951  Reason for Encounter: CPP Appointment Reschedule/ Symbicort PAP  Medications: Outpatient Encounter Medications as of 03/10/2021  Medication Sig   Albuterol Sulfate (PROAIR RESPICLICK) 595 (90 Base) MCG/ACT AEPB Inhale 1 puff into the lungs every 6 (six) hours as needed (wheezing or shortness of breath).   amLODipine (NORVASC) 5 MG tablet Take 1 tablet (5 mg total) by mouth daily.   aspirin EC 81 MG tablet Take 1 tablet (81 mg total) by mouth daily.   buPROPion (WELLBUTRIN XL) 300 MG 24 hr tablet Take 1 tablet (300 mg total) by mouth daily.   cetirizine (ZYRTEC) 10 MG tablet Take 10 mg by mouth at bedtime.   COLLODION FLEXIBLE EX Apply topically. (Patient not taking: No sig reported)   desvenlafaxine (PRISTIQ) 100 MG 24 hr tablet Take 1 tablet (100 mg total) by mouth daily.   dexlansoprazole (DEXILANT) 60 MG capsule Take 1 capsule (60 mg total) by mouth daily.   ezetimibe (ZETIA) 10 MG tablet Take 1 tablet (10 mg total) by mouth daily.   fluticasone (FLONASE) 50 MCG/ACT nasal spray Use 2 spray(s) in each nostril once daily   gabapentin (NEURONTIN) 300 MG capsule TAKE ONE CAPSULE BY MOUTH EVERYDAY AT BEDTIME   LORazepam (ATIVAN) 0.5 MG tablet TAKE ONE TABLET BY MOUTH TWICE DAILY AS NEEDED FOR ANXIETY. DON'T DRIVE EIGHT HOURS AFTER USING   Multiple Vitamins-Minerals (MULTIVITAMIN ADULT PO) Take 1 tablet by mouth daily.    nitroGLYCERIN (NITROSTAT) 0.4 MG SL tablet Place 1 tablet (0.4 mg total) under the tongue every 5 (five) minutes as needed for chest pain (if pain does not resolve with 2 doses, call 911). (Patient not taking: Reported on 01/20/2021)   propranolol (INDERAL) 20 MG tablet Take 1 tablet (20 mg total) by mouth 2 (two) times daily.   rosuvastatin (CRESTOR) 40 MG tablet Take 1 tablet (40 mg total) by mouth daily.   SPIRIVA HANDIHALER 18 MCG inhalation capsule Place 1 capsule  (18 mcg total) into inhaler and inhale 2 (two) times daily.   SYMBICORT 160-4.5 MCG/ACT inhaler Inhale 2 puffs into the lungs 2 (two) times daily.   valACYclovir (VALTREX) 1000 MG tablet Take 2 pills twice a day for 1 day at first sign of cold sore   [DISCONTINUED] Aclidinium Bromide (TUDORZA PRESSAIR) 400 MCG/ACT AEPB Inhale 1 puff into the lungs 2 (two) times daily.   No facility-administered encounter medications on file as of 03/10/2021.   I spoke with Katherine Foley at [place], patient assistance program for Symbicort, and they had not received the requested information to date. We were able to give verbal instructions and demographic information due to the prolonged wait time for this medication. Upon discovering that Katherine Foley has been paying for this medication, they offered to contact her for reimbursement.  The request was processed successfully by the pharmacist and they will now be waiting to approve this.   Katherine Foley was made aware and she will be periodically checking with the company to arrange delivery.  Original Appointment: 07/11 at 9 am  Phone visit has been rescheduled for 08/15 at 11 am   [facility] [employer], [place]

## 2021-03-13 ENCOUNTER — Telehealth: Payer: PPO

## 2021-03-16 ENCOUNTER — Telehealth: Payer: Self-pay

## 2021-03-16 NOTE — Chronic Care Management (AMB) (Addendum)
Chronic Care Management Pharmacy Assistant   Name: Katherine Foley  MRN: [id number] DOB: 08/14/1951   Reason for Encounter: Medication Coordination Call    Recent office visits:  None  Recent consult visits:  None  Hospital visits:  None in previous 6 months  Medications: Outpatient Encounter Medications as of 03/16/2021  Medication Sig   Albuterol Sulfate (PROAIR RESPICLICK) 063 (90 Base) MCG/ACT AEPB Inhale 1 puff into the lungs every 6 (six) hours as needed (wheezing or shortness of breath).   amLODipine (NORVASC) 5 MG tablet Take 1 tablet (5 mg total) by mouth daily.   aspirin EC 81 MG tablet Take 1 tablet (81 mg total) by mouth daily.   buPROPion (WELLBUTRIN XL) 300 MG 24 hr tablet Take 1 tablet (300 mg total) by mouth daily.   cetirizine (ZYRTEC) 10 MG tablet Take 10 mg by mouth at bedtime.   COLLODION FLEXIBLE EX Apply topically. (Patient not taking: No sig reported)   desvenlafaxine (PRISTIQ) 100 MG 24 hr tablet Take 1 tablet (100 mg total) by mouth daily.   dexlansoprazole (DEXILANT) 60 MG capsule Take 1 capsule (60 mg total) by mouth daily.   ezetimibe (ZETIA) 10 MG tablet Take 1 tablet (10 mg total) by mouth daily.   fluticasone (FLONASE) 50 MCG/ACT nasal spray Use 2 spray(s) in each nostril once daily   gabapentin (NEURONTIN) 300 MG capsule TAKE ONE CAPSULE BY MOUTH EVERYDAY AT BEDTIME   LORazepam (ATIVAN) 0.5 MG tablet TAKE ONE TABLET BY MOUTH TWICE DAILY AS NEEDED FOR ANXIETY. DON'T DRIVE EIGHT HOURS AFTER USING   Multiple Vitamins-Minerals (MULTIVITAMIN ADULT PO) Take 1 tablet by mouth daily.    nitroGLYCERIN (NITROSTAT) 0.4 MG SL tablet Place 1 tablet (0.4 mg total) under the tongue every 5 (five) minutes as needed for chest pain (if pain does not resolve with 2 doses, call 911). (Patient not taking: Reported on 01/20/2021)   propranolol (INDERAL) 20 MG tablet Take 1 tablet (20 mg total) by mouth 2 (two) times daily.   rosuvastatin (CRESTOR) 40 MG tablet Take  1 tablet (40 mg total) by mouth daily.   SPIRIVA HANDIHALER 18 MCG inhalation capsule Place 1 capsule (18 mcg total) into inhaler and inhale 2 (two) times daily.   SYMBICORT 160-4.5 MCG/ACT inhaler Inhale 2 puffs into the lungs 2 (two) times daily.   valACYclovir (VALTREX) 1000 MG tablet Take 2 pills twice a day for 1 day at first sign of cold sore   [DISCONTINUED] Aclidinium Bromide (TUDORZA PRESSAIR) 400 MCG/ACT AEPB Inhale 1 puff into the lungs 2 (two) times daily.   No facility-administered encounter medications on file as of 03/16/2021.    Reviewed chart for medication changes ahead of medication coordination call.  No OVs, Consults, or hospital visits since last care coordination call/Pharmacist visit. (If appropriate, list visit date, provider name)  No medication changes indicated OR if recent visit, treatment plan here.  BP Readings from Last 3 Encounters:  02/14/21 (!) 106/58  01/20/21 124/72  12/23/20 126/66    Lab Results  Component Value Date   HGBA1C 6.2 (H) 04/27/2011     Patient obtains medications through Adherence Packaging  90 Days   Last adherence delivery included:  Gabapentin 300 mg Take one capsule by mouth everyday at bedtime Ezetimibe 10 mg Tab Take one tab by mouth once daily Dexlansoprazole 60 mg Cap Take one capsule once daily Propranolol 20 mg tab Take one tab by mouth twice daily  Bupropion 300 mg- Take one tab  by mouth once daily Desvenlafaxine 100 mg-Take one tab by mouth once daily Rosuvastatin 40 mg- Take one tab by mouth once daily  Patient is due for next adherence delivery on: 03/21/2021. Called patient and reviewed medications and coordinated delivery.  This delivery to include: Amlodipine 5 mg tablet once daily.  Patient states she has been approved for patient assistance for medication Symbicort. She states she was approved a few months ago in April. She states she has not yet received her medication from the company.  I called and  spoke with [employer] and was told that medication Symbicort was shipped out on 03/14/2021 and the patient should be received in 7-10 days  Confirmed delivery date of 03/21/2021, advised patient that pharmacy will contact them the morning of delivery.  [name], [place] Pharmacist Assistant [phone]   7 minutes spent in review, coordination, and documentation.  Reviewed by: Beverly Milch, PharmD Clinical Pharmacist [phone]

## 2021-03-29 DIAGNOSIS — S82841A Displaced bimalleolar fracture of right lower leg, initial encounter for closed fracture: Secondary | ICD-10-CM | POA: Diagnosis not present

## 2021-03-30 NOTE — Progress Notes (Signed)
Phone [phone] Virtual visit via Video note   Subjective:  Chief complaint: Chief Complaint  Patient presents with   Depression   This visit type was conducted due to national recommendations for restrictions regarding the COVID-19 Pandemic (e.g. social distancing).  This format is felt to be most appropriate for this patient at this time balancing risks to patient and risks to population by having him in for in person visit.  No physical exam was performed (except for noted visual exam or audio findings with Telehealth visits).    Our team/I connected with [name] at  9:20 AM EDT by a video enabled telemedicine application ([email or web address] or caregility through epic) and verified that I am speaking with the correct person using two identifiers.  Location patient: [place] Location provider: [facility], office Persons participating in the virtual visit:  patient  Our team/I discussed the limitations of evaluation and management by telemedicine and the availability of in person appointments. In light of current covid-19 pandemic, patient also understands that we are trying to protect them by minimizing in office contact if at all possible.  The patient expressed consent for telemedicine visit and agreed to proceed. Patient understands insurance will be billed.   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    Priority: High   Depression 09/22/2015    Priority: High   Former smoker 09/22/2015    Priority: High   S/P AVR (aortic valve replacement) 11/06/2013    Priority: High   Aortic stenosis 07/07/2010    Priority: High   CAD (coronary artery disease) 05/17/2010    Priority: High   Chronic obstructive pulmonary disease ([place]) 07/23/2007    Priority: High   Aortic atherosclerosis ([place]) 03/10/2020    Priority: Medium   Restless legs 08/25/2019    Priority:  Medium   Hot flashes 03/22/2016    Priority: Medium   Diastolic dysfunction 123456    Priority: Medium   GERD (gastroesophageal reflux disease) 06/04/2014    Priority: Medium   Carotid stenosis 05/17/2010    Priority: Medium   HLD (hyperlipidemia) 08/29/2009    Priority: Medium   Essential hypertension 04/14/2007    Priority: Medium   Osteopenia 04/14/2007    Priority: Medium   Genetic testing 02/10/2018    Priority: Low   Family history of breast cancer     Priority: Low   Allergic rhinitis 09/22/2015    Priority: Low   Adenomatous colon polyp     Priority: Low   Herpes simplex virus (HSV) infection 01/16/2010    Priority: Low    Medications- reviewed and updated Current Outpatient Medications  Medication Sig Dispense Refill   Albuterol Sulfate (PROAIR RESPICLICK) 123XX123 (90 Base) MCG/ACT AEPB Inhale 1 puff into the lungs every 6 (six) hours as needed (wheezing or shortness of breath). 1 each 3   amLODipine (NORVASC) 5 MG tablet Take 1 tablet (5 mg total) by mouth daily. 90 tablet 3   aspirin EC 81 MG tablet Take 1 tablet (81 mg total) by mouth daily.     buPROPion (WELLBUTRIN XL) 300 MG 24 hr tablet Take 1 tablet (300 mg total) by mouth daily. 30 tablet 5   cetirizine (ZYRTEC) 10 MG tablet Take 10 mg by mouth at bedtime.     desvenlafaxine (PRISTIQ) 100 MG 24 hr tablet Take 1 tablet (100 mg total) by mouth  daily. 90 tablet 2   dexlansoprazole (DEXILANT) 60 MG capsule Take 1 capsule (60 mg total) by mouth daily. 90 capsule 3   ezetimibe (ZETIA) 10 MG tablet Take 1 tablet (10 mg total) by mouth daily. 90 tablet 3   fluticasone (FLONASE) 50 MCG/ACT nasal spray Use 2 spray(s) in each nostril once daily 16 g 5   gabapentin (NEURONTIN) 300 MG capsule TAKE ONE CAPSULE BY MOUTH EVERYDAY AT BEDTIME 30 capsule 3   Multiple Vitamins-Minerals (MULTIVITAMIN ADULT PO) Take 1 tablet by mouth daily.      nitroGLYCERIN (NITROSTAT) 0.4 MG SL tablet Place 1 tablet (0.4 mg total) under the  tongue every 5 (five) minutes as needed for chest pain (if pain does not resolve with 2 doses, call 911). 30 tablet 5   propranolol (INDERAL) 20 MG tablet Take 1 tablet (20 mg total) by mouth 2 (two) times daily. 180 tablet 3   rosuvastatin (CRESTOR) 40 MG tablet Take 1 tablet (40 mg total) by mouth daily. 90 tablet 2   SPIRIVA HANDIHALER 18 MCG inhalation capsule Place 1 capsule (18 mcg total) into inhaler and inhale 2 (two) times daily. (Patient taking differently: Place 18 mcg into inhaler and inhale daily.) 90 capsule 2   SYMBICORT 160-4.5 MCG/ACT inhaler Inhale 2 puffs into the lungs 2 (two) times daily. 11 g 3   valACYclovir (VALTREX) 1000 MG tablet Take 2 pills twice a day for 1 day at first sign of cold sore 30 tablet 1   LORazepam (ATIVAN) 0.5 MG tablet TAKE ONE TABLET BY MOUTH TWICE DAILY AS NEEDED FOR ANXIETY. DON'T DRIVE EIGHT HOURS AFTER USING 60 tablet 0   No current facility-administered medications for this visit.     Objective:  Ht '5\' 2"'$  (1.575 m)   Wt 118 lb (53.5 kg)   BMI 21.58 kg/m  self reported vitals Gen: sad mood, tearful through portions of visit Lungs: nonlabored, normal respiratory rate  Skin: appears dry, no obvious rash     Assessment and Plan   # Depression-loss of fianc of 72 years of age 36 this year on her sister's birthday.  Previously lost her best friend/daughter Angie in 2019  S: Medication: pristiq '100mg'$ , plus increased wellbutrin to '300mg'$  XR - also needing lorazepam most days right now- able to tolerate just once at night to help he avoid crying herself to sleep  Unfortunately this has not made a significant impact for her yet and started 2 months ago  - broke ankle in June - starting to be able to walk with walker. Not at [place] at moment. This has been a set back mentally as well. Starts therapy on Monday -abilify not best option with heart disease  -not being in her prior home and missing long term fiancee- extremely hard. Plus not being  able to drive or walk has made thing extremely challenging.  Depression screen [facility] 2/9 03/31/2021 01/20/2021 12/23/2020  Decreased Interest '3 2 3  '$ Down, Depressed, Hopeless '3 2 2  '$ PHQ - 2 Score '6 4 5  '$ Altered sleeping 0 3 3  Tired, decreased energy '1 3 3  '$ Change in appetite 0 3 3  Feeling bad or failure about yourself  3 0 2  Trouble concentrating '3 1 3  '$ Moving slowly or fidgety/restless 1 0 1  Suicidal thoughts 0 0 1  PHQ-9 Score '14 14 21  '$ Difficult doing work/chores Somewhat difficult Somewhat difficult Somewhat difficult  Some recent data might be hidden  A/P: Poor control  of depression/partial remission with PHQ-9 at 14-this is with significant setback of fracture and immobility.  I think it is difficult to fully evaluate benefit of Wellbutrin given the setback-we discussed a few options including hospice grief counseling, referral to psychiatry or traditional therapist.  Patient grieving loss of daughter, prior fianc, and even really loss of the life she had/prior home-she would like to focus on the grief counseling for now and I gave her the number for [facility]-  [phone] -We will follow-up in 2 months -significant anxiety as well- will continue ativan since it has been helping her- consider weaning once depression in better position  # COPD S: Patient with COPD flare at last visit treated with course of prednisone.  She reports breathing is back to baseline using albuterol about twice a week.  She is compliant with Spiriva and Symbicort A/P: Well-controlled-continue current medication  Recommended follow up: 2 month recommended Future Appointments  Date Time Provider [place]  04/27/2021  9:30 AM [facility] HEALTH COACH [facility]  06/21/2021  3:45 PM Richardson Dopp T, PA-C [facility]   Lab/Order associations:   ICD-10-CM   1. Recurrent major depressive disorder, in partial remission ([place])  F33.41     2. Chronic obstructive pulmonary  disease, unspecified COPD type ([place])  J44.9       Meds ordered this encounter  Medications   LORazepam (ATIVAN) 0.5 MG tablet    Sig: TAKE ONE TABLET BY MOUTH TWICE DAILY AS NEEDED FOR ANXIETY. DON'T DRIVE EIGHT HOURS AFTER USING    Dispense:  60 tablet    Refill:  0   Return precautions advised.  Garret Reddish, MD

## 2021-03-31 ENCOUNTER — Encounter: Payer: Self-pay | Admitting: Family Medicine

## 2021-03-31 ENCOUNTER — Other Ambulatory Visit: Payer: Self-pay

## 2021-03-31 ENCOUNTER — Telehealth (INDEPENDENT_AMBULATORY_CARE_PROVIDER_SITE_OTHER): Payer: PPO | Admitting: Family Medicine

## 2021-03-31 VITALS — Ht 62.0 in | Wt 118.0 lb

## 2021-03-31 DIAGNOSIS — F3341 Major depressive disorder, recurrent, in partial remission: Secondary | ICD-10-CM

## 2021-03-31 DIAGNOSIS — J449 Chronic obstructive pulmonary disease, unspecified: Secondary | ICD-10-CM

## 2021-03-31 MED ORDER — LORAZEPAM 0.5 MG PO TABS: ORAL_TABLET | ORAL | 0 refills | Status: DC

## 2021-04-03 DIAGNOSIS — M25671 Stiffness of right ankle, not elsewhere classified: Secondary | ICD-10-CM | POA: Diagnosis not present

## 2021-04-03 DIAGNOSIS — S82841D Displaced bimalleolar fracture of right lower leg, subsequent encounter for closed fracture with routine healing: Secondary | ICD-10-CM | POA: Diagnosis not present

## 2021-04-10 DIAGNOSIS — S82841D Displaced bimalleolar fracture of right lower leg, subsequent encounter for closed fracture with routine healing: Secondary | ICD-10-CM | POA: Diagnosis not present

## 2021-04-10 DIAGNOSIS — M25671 Stiffness of right ankle, not elsewhere classified: Secondary | ICD-10-CM | POA: Diagnosis not present

## 2021-04-17 DIAGNOSIS — S82841D Displaced bimalleolar fracture of right lower leg, subsequent encounter for closed fracture with routine healing: Secondary | ICD-10-CM | POA: Diagnosis not present

## 2021-04-17 DIAGNOSIS — M25671 Stiffness of right ankle, not elsewhere classified: Secondary | ICD-10-CM | POA: Diagnosis not present

## 2021-04-24 DIAGNOSIS — M25671 Stiffness of right ankle, not elsewhere classified: Secondary | ICD-10-CM | POA: Diagnosis not present

## 2021-04-24 DIAGNOSIS — S82841D Displaced bimalleolar fracture of right lower leg, subsequent encounter for closed fracture with routine healing: Secondary | ICD-10-CM | POA: Diagnosis not present

## 2021-04-27 ENCOUNTER — Ambulatory Visit: Payer: PPO

## 2021-05-01 DIAGNOSIS — M25671 Stiffness of right ankle, not elsewhere classified: Secondary | ICD-10-CM | POA: Diagnosis not present

## 2021-05-01 DIAGNOSIS — S82841D Displaced bimalleolar fracture of right lower leg, subsequent encounter for closed fracture with routine healing: Secondary | ICD-10-CM | POA: Diagnosis not present

## 2021-05-04 ENCOUNTER — Ambulatory Visit: Payer: PPO

## 2021-05-09 DIAGNOSIS — S82841D Displaced bimalleolar fracture of right lower leg, subsequent encounter for closed fracture with routine healing: Secondary | ICD-10-CM | POA: Diagnosis not present

## 2021-05-09 DIAGNOSIS — M25671 Stiffness of right ankle, not elsewhere classified: Secondary | ICD-10-CM | POA: Diagnosis not present

## 2021-05-10 DIAGNOSIS — M25571 Pain in right ankle and joints of right foot: Secondary | ICD-10-CM | POA: Diagnosis not present

## 2021-05-10 DIAGNOSIS — S82841D Displaced bimalleolar fracture of right lower leg, subsequent encounter for closed fracture with routine healing: Secondary | ICD-10-CM | POA: Diagnosis not present

## 2021-05-10 DIAGNOSIS — Z9889 Other specified postprocedural states: Secondary | ICD-10-CM | POA: Diagnosis not present

## 2021-05-11 ENCOUNTER — Encounter (HOSPITAL_COMMUNITY): Payer: Self-pay | Admitting: Physician Assistant

## 2021-05-14 ENCOUNTER — Other Ambulatory Visit: Payer: Self-pay | Admitting: Family Medicine

## 2021-05-18 ENCOUNTER — Telehealth: Payer: Self-pay | Admitting: Pharmacist

## 2021-05-18 NOTE — Chronic Care Management (AMB) (Addendum)
Chronic Care Management Pharmacy Assistant   Name: Katherine Foley  MRN: [id number] DOB: 21-Aug-1951   Reason for Encounter: Medication Coordination Call    Recent office visits:  03/31/2021 [facility] (PCP) Marin Olp, MD; follow up depression, no medication changes indicated.  Recent consult visits:  03/29/2021 OV (Orthopedic Surgery) Melony Overly R; Displaced bimalleolar fracture of right lower leg, no further information available  04/03/2021 OV (Orthopedic Surgery) Royden Purl, Stiffness of right ankle, no further information available.  Hospital visits:  None  Medications: Outpatient Encounter Medications as of 05/18/2021  Medication Sig   Albuterol Sulfate (PROAIR RESPICLICK) 123XX123 (90 Base) MCG/ACT AEPB Inhale 1 puff into the lungs every 6 (six) hours as needed (wheezing or shortness of breath).   amLODipine (NORVASC) 5 MG tablet Take 1 tablet (5 mg total) by mouth daily.   aspirin EC 81 MG tablet Take 1 tablet (81 mg total) by mouth daily.   buPROPion (WELLBUTRIN XL) 300 MG 24 hr tablet Take 1 tablet (300 mg total) by mouth daily.   cetirizine (ZYRTEC) 10 MG tablet Take 10 mg by mouth at bedtime.   desvenlafaxine (PRISTIQ) 100 MG 24 hr tablet TAKE ONE TABLET BY MOUTH EVERY MORNING   dexlansoprazole (DEXILANT) 60 MG capsule Take 1 capsule (60 mg total) by mouth daily.   ezetimibe (ZETIA) 10 MG tablet Take 1 tablet (10 mg total) by mouth daily.   fluticasone (FLONASE) 50 MCG/ACT nasal spray Use 2 spray(s) in each nostril once daily   gabapentin (NEURONTIN) 300 MG capsule TAKE ONE CAPSULE BY MOUTH EVERYDAY AT BEDTIME   LORazepam (ATIVAN) 0.5 MG tablet TAKE ONE TABLET BY MOUTH TWICE DAILY AS NEEDED FOR ANXIETY. DON'T DRIVE EIGHT HOURS AFTER USING   Multiple Vitamins-Minerals (MULTIVITAMIN ADULT PO) Take 1 tablet by mouth daily.    nitroGLYCERIN (NITROSTAT) 0.4 MG SL tablet Place 1 tablet (0.4 mg total) under the tongue every 5 (five) minutes as needed for chest pain  (if pain does not resolve with 2 doses, call 911).   propranolol (INDERAL) 20 MG tablet Take 1 tablet (20 mg total) by mouth 2 (two) times daily.   rosuvastatin (CRESTOR) 40 MG tablet Take 1 tablet (40 mg total) by mouth daily.   SPIRIVA HANDIHALER 18 MCG inhalation capsule Place 1 capsule (18 mcg total) into inhaler and inhale 2 (two) times daily. (Patient taking differently: Place 18 mcg into inhaler and inhale daily.)   SYMBICORT 160-4.5 MCG/ACT inhaler Inhale 2 puffs into the lungs 2 (two) times daily.   valACYclovir (VALTREX) 1000 MG tablet Take 2 pills twice a day for 1 day at first sign of cold sore   [DISCONTINUED] Aclidinium Bromide (TUDORZA PRESSAIR) 400 MCG/ACT AEPB Inhale 1 puff into the lungs 2 (two) times daily.   No facility-administered encounter medications on file as of 05/18/2021.     BP Readings from Last 3 Encounters:  02/14/21 (!) 106/58  01/20/21 124/72  12/23/20 126/66    Lab Results  Component Value Date   HGBA1C 6.2 (H) 04/27/2011     Patient obtains medications through Vials  90 Days   Last adherence delivery included:  Gabapentin 300 mg Take one capsule by mouth everyday at bedtime Ezetimibe 10 mg Tab Take one tab by mouth once daily Dexlansoprazole 60 mg Cap Take one capsule once daily Propranolol 20 mg tab Take one tab by mouth twice daily  Bupropion 300 mg- Take one tab by mouth once daily Desvenlafaxine 100 mg-Take one tab by mouth once  daily Rosuvastatin 40 mg- Take one tab by mouth once daily  Patient is due for next adherence delivery on: 05/26/2021. Called patient and reviewed medications and coordinated delivery.  This delivery to include: Gabapentin 300 mg Take one capsule by mouth everyday at bedtime Ezetimibe 10 mg Tab Take one tab by mouth once daily Dexlansoprazole 60 mg Cap Take one capsule once daily Propranolol 20 mg tab Take one tab by mouth twice daily  Amlodipine 5 mg once daily Lorazepam 0.5 mg twice daily as  needed Desvenlafaxine 100 mg-Take one tab by mouth once daily Rosuvastatin 40 mg- Take one tab by mouth once daily   Confirmed delivery date of 05/26/2021, advised patient that pharmacy will contact them the morning of delivery.   Care Gaps: Medicare Annual Wellness: scheduled AWV on 05/19/2021 at 1:45 pm Zoster Vaccines- Shingrix: Completed PNA Vaccination: Completed Influenza Vaccination:  Hemoglobin A1C: n/a Colonoscopy: Next due on 07/14/2024 Tetanus/DTAP: Discontinued Dexa Scan: Completed Mammogram: Next due on 06/16/2021 HPV Vaccines: Aged Out  Future Appointments  Date Time Provider [place]  05/19/2021  1:45 PM [facility] HEALTH COACH [facility]  06/21/2021  3:45 PM Richardson Dopp T, PA-C [facility]    Star Rating Drugs: Rosuvastatin 40 mg last filled 02/23/2021 90 DS  [name], [place] Pharmacist Assistant [phone]   8 minutes spent in review, coordination, and documentation.  Reviewed by: Beverly Milch, PharmD Clinical Pharmacist [phone]

## 2021-05-19 ENCOUNTER — Other Ambulatory Visit: Payer: Self-pay | Admitting: Family Medicine

## 2021-05-19 ENCOUNTER — Ambulatory Visit (INDEPENDENT_AMBULATORY_CARE_PROVIDER_SITE_OTHER): Payer: PPO

## 2021-05-19 ENCOUNTER — Other Ambulatory Visit: Payer: Self-pay

## 2021-05-19 DIAGNOSIS — Z0000 Encounter for general adult medical examination without abnormal findings: Secondary | ICD-10-CM

## 2021-05-19 DIAGNOSIS — Z1231 Encounter for screening mammogram for malignant neoplasm of breast: Secondary | ICD-10-CM

## 2021-05-19 NOTE — Progress Notes (Signed)
Virtual Visit via Telephone Note  I connected with  Katherine Foley on 05/19/21 at  1:45 PM EDT by telephone and verified that I am speaking with the correct person using two identifiers.  Medicare Annual Wellness visit completed telephonically due to Covid-19 pandemic.   Persons participating in this call: This Health Coach and this patient.   Location: Patient: home Provider: office   I discussed the limitations, risks, security and privacy concerns of performing an evaluation and management service by telephone and the availability of in person appointments. The patient expressed understanding and agreed to proceed.  Unable to perform video visit due to video visit attempted and failed and/or patient does not have video capability.   Some vital signs may be absent or patient reported.   Willette Brace, LPN   Subjective:   Katherine Foley is a 70 y.o. female who presents for Medicare Annual (Subsequent) preventive examination.  Review of Systems     Cardiac Risk Factors include: advanced age (>11mn, >>81women);hypertension;dyslipidemia     Objective:    Today's Vitals   05/19/21 1341  PainSc: 5    There is no height or weight on file to calculate BMI.  Advanced Directives 05/19/2021 02/14/2021 02/06/2021 04/21/2020 04/28/2019 04/22/2019 05/26/2018  Does Patient Have a Medical Advance Directive? Yes No No Yes No No No  Type of AAcademic librarian- - Healthcare Power of ALyerlyLiving will - - -  Copy of HVazquezin Chart? No - copy requested - - No - copy requested - - -  Would patient like information on creating a medical advance directive? - No - Patient declined No - Patient declined - No - Patient declined No - Patient declined No - Patient declined    Current Medications (verified) Outpatient Encounter Medications as of 05/19/2021  Medication Sig   Albuterol Sulfate (PROAIR RESPICLICK) 1785(90 Base) MCG/ACT  AEPB Inhale 1 puff into the lungs every 6 (six) hours as needed (wheezing or shortness of breath).   amLODipine (NORVASC) 5 MG tablet Take 1 tablet (5 mg total) by mouth daily.   aspirin EC 81 MG tablet Take 1 tablet (81 mg total) by mouth daily.   buPROPion (WELLBUTRIN XL) 300 MG 24 hr tablet Take 1 tablet (300 mg total) by mouth daily.   Calcium Carb-Cholecalciferol (CALCIUM 500/D PO) Take by mouth. Mag and zinc added   cetirizine (ZYRTEC) 10 MG tablet Take 10 mg by mouth at bedtime.   desvenlafaxine (PRISTIQ) 100 MG 24 hr tablet TAKE ONE TABLET BY MOUTH EVERY MORNING   dexlansoprazole (DEXILANT) 60 MG capsule Take 1 capsule (60 mg total) by mouth daily.   ezetimibe (ZETIA) 10 MG tablet Take 1 tablet (10 mg total) by mouth daily.   fluticasone (FLONASE) 50 MCG/ACT nasal spray Use 2 spray(s) in each nostril once daily   gabapentin (NEURONTIN) 300 MG capsule TAKE ONE CAPSULE BY MOUTH EVERYDAY AT BEDTIME   LORazepam (ATIVAN) 0.5 MG tablet TAKE ONE TABLET BY MOUTH TWICE DAILY AS NEEDED FOR ANXIETY. DON'T DRIVE EIGHT HOURS AFTER USING   Multiple Vitamins-Minerals (MULTIVITAMIN ADULT PO) Take 1 tablet by mouth daily.    nitroGLYCERIN (NITROSTAT) 0.4 MG SL tablet Place 1 tablet (0.4 mg total) under the tongue every 5 (five) minutes as needed for chest pain (if pain does not resolve with 2 doses, call 911).   propranolol (INDERAL) 20 MG tablet Take 1 tablet (20 mg total) by mouth 2 (two) times  daily.   rosuvastatin (CRESTOR) 40 MG tablet Take 1 tablet (40 mg total) by mouth daily.   SPIRIVA HANDIHALER 18 MCG inhalation capsule Place 1 capsule (18 mcg total) into inhaler and inhale 2 (two) times daily. (Patient taking differently: Place 18 mcg into inhaler and inhale daily.)   SYMBICORT 160-4.5 MCG/ACT inhaler Inhale 2 puffs into the lungs 2 (two) times daily.   valACYclovir (VALTREX) 1000 MG tablet Take 2 pills twice a day for 1 day at first sign of cold sore   [DISCONTINUED] Aclidinium Bromide (TUDORZA  PRESSAIR) 400 MCG/ACT AEPB Inhale 1 puff into the lungs 2 (two) times daily.   No facility-administered encounter medications on file as of 05/19/2021.    Allergies (verified) Codeine sulfate and Hydrocodone-acetaminophen   History: Past Medical History:  Diagnosis Date   Adenomatous colon polyp    Anxiety    ANXIETY DEPRESSION 02/15/2009   AORTIC STENOSIS 07/07/2010   a. echo 12/23/10: EF 60-65%, mod to severe AS with mean gradient 29 mmHg;  b. 04/2011 s/p bioprosthetic AVR. // Echo 9/19:  Mild conc LVH, EF 60-65, no RWMA, Gr 1 DD, AVR ok (mean 10, peak 21), trivial MR, normal RVSF, mild TR    Barrett's esophagus 04/14/2007   Barrett's esophagus 2000   CAD, NATIVE VESSEL 05/17/2010   a. NSTEMI 8/11 - BMS to RCA;  b. cath 4/12: dLAD 50%, RI 80% (PCI), AVCFX 30%, pRCA 30%, stent ok, 40-50, then 50% dist RCA;  c. s/p Promus DES to RI 12/25/10;  d. 04/2011 CABG x 1 (LIMA->LAD) @ time of AVR   Carotid stenosis 05/17/2010   a. 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   Exertional shortness of breath    "related to COPD problems" (03/25/2013)   Family history of breast cancer    2 daughters; PALB2 mutation in family   Genetic testing 02/10/2018   PALB2 analysis at [facility] - Negative for familial mutation   GERD (gastroesophageal reflux disease)    H/O hiatal hernia    Hemorrhoid thrombosis    "had it lanced" (03/25/2013)   Herpes simplex without mention of complication 33/35/4562   History of blood transfusion    'w/OHS" (03/25/2013)   HYPERLIPIDEMIA, MILD 08/29/2009   HYPERTENSION 04/14/2007   MIGRAINE HEADACHE 04/20/2008   Myocardial infarction ([place]) 2011   "during catherization" (03/25/2013)   OSTEOPENIA 04/14/2007   Ovarian cyst    Parotid mass    left   Pneumonia    "multiple times" (03/25/2013)   URINARY INCONTINENCE, STRESS, MILD 08/26/2007    Urine, incontinence, stress female    Past Surgical History:  Procedure Laterality Date   AORTIC VALVE REPLACEMENT  2012   APPENDECTOMY     BLEPHAROPLASTY Bilateral ~ [place] N/A 07/06/2016   Procedure: Left Heart Cath and Cors/Grafts Angiography;  Surgeon: Sherren Mocha, MD;  Location: [place] [facility];  Service: Cardiovascular;  Laterality: N/A;   CHOLECYSTECTOMY N/A 03/25/2013   Procedure: LAPAROSCOPIC CHOLECYSTECTOMY WITH INTRAOPERATIVE CHOLANGIOGRAM;  Surgeon: Imogene Burn. Georgette Dover, MD;  Location: [place];  Service: General;  Laterality: N/A;   CORONARY ANGIOGRAPHY N/A 05/26/2018   Procedure: CORONARY ANGIOGRAPHY (CATH LAB);  Surgeon: Jettie Booze, MD;  Location: [facility];  Service: Cardiovascular;  Laterality: N/A;   CORONARY ANGIOPLASTY WITH STENT PLACEMENT  2010   "I have 2 stents" (03/25/2013)  NASAL SINUS SURGERY  1997; 1998   "cust in maxillary sinus; put a stent in then removed it" (03/25/2013)   ORIF ANKLE FRACTURE Right 02/14/2021   Procedure: OPEN TREATMENT OF RIGHT TRIMALLEOLAR ANKLE FRACTURE WITHOUT POSTERIOR FIXATION, SYNDESMOSIS;  Surgeon: Erle Crocker, MD;  Location: [facility];  Service: Orthopedics;  Laterality: Right;   OVARIAN CYST REMOVAL Right 2011   "size of a soccer ball" (03/25/2013)   PAROTIDECTOMY Left 04/28/2019   Procedure: LEFT TOTAL PAROTIDECTOMY;  Surgeon: Leta Baptist, MD;  Location: [place] [facility];  Service: ENT;  Laterality: Left;   thrombosed hemorrohid lanced     TONSILLECTOMY AND ADENOIDECTOMY     TOTAL ABDOMINAL HYSTERECTOMY     Family History  Problem Relation Age of Onset   Coronary artery disease Other    Heart disease Other        6 stents   Heart disease Mother    COPD Father        smoked   Heart disease Father    Rheum arthritis Father    Heart disease Maternal Uncle    Breast cancer Daughter        currently 48   Asthma Son        as a child    Breast cancer  Daughter 50       currently 63; PALB2 mutation   Cancer Other        very distant paternal cousin; pancreatic ca   Cirrhosis Sister    Stroke Brother    Colon cancer Neg Hx    Colon polyps Neg Hx    Social History   Socioeconomic History   Marital status: Single    Spouse name: Not on file   Number of children: 3   Years of education: Not on file   Highest education level: Not on file  Occupational History   Occupation: Glass blower/designer: [employer]   Occupation: retired  Tobacco Use   Smoking status: Former    Packs/day: 1.50    Years: 44.00    Pack years: 66.00    Types: Cigarettes    Quit date: 09/03/2005    Years since quitting: 15.7   Smokeless tobacco: Never  Vaping Use   Vaping Use: Never used  Substance and Sexual Activity   Alcohol use: Not Currently    Alcohol/week: 0.0 standard drinks    Comment: social   Drug use: No   Sexual activity: Not Currently    Birth control/protection: Post-menopausal, Surgical  Other Topics Concern   Not on file  Social History Narrative   Lost fiancee of 22 years in 2022. 3 children (2 living). 6 grandkids. 2 greatgrandkids. Keeps greatgrandson on tuesdays- loves her so much.       Retired from [employer] (parttime- Dec 12)   Faith is very important to her      Hobbies: reading, cross stich, sew. Wants to learn to knit.       Social Determinants of Health   Financial Resource Strain: Low Risk    Difficulty of Paying Living Expenses: Not hard at all  Food Insecurity: No Food Insecurity   Worried About Charity fundraiser in the Last Year: Never true   [place] in the Last Year: Never true  Transportation Needs: No Transportation Needs   Lack of Transportation (Medical): No   Lack of Transportation (Non-Medical): No  Physical Activity: Sufficiently Active   Days of Exercise per Week:  5 days   Minutes of Exercise per Session: 30 min  Stress: Stress Concern Present   Feeling of Stress : To some extent  Social  Connections: Moderately Isolated   Frequency of Communication with Friends and Family: Three times a week   Frequency of Social Gatherings with Friends and Family: Three times a week   Attends Religious Services: More than 4 times per year   Active Member of Clubs or Organizations: No   Attends Archivist Meetings: Never   Marital Status: Never married    Tobacco Counseling Counseling given: Not Answered   Clinical Intake:  Pre-visit preparation completed: Yes  Pain : 0-10 Pain Score: 5  Pain Type: Chronic pain Pain Location: Ankle Pain Orientation: Right Pain Descriptors / Indicators: Aching Pain Onset: More than a month ago Pain Frequency: Constant     BMI - recorded: 21.58 Nutritional Status: BMI of 19-24  Normal Nutritional Risks: None Diabetes: No  How often do you need to have someone help you when you read instructions, pamphlets, or other written materials from your doctor or pharmacy?: 1 - Never  Diabetic?no  Interpreter Needed?: No  Information entered by :: Charlott Rakes, LPN   Activities of Daily Living In your present state of health, do you have any difficulty performing the following activities: 05/19/2021 02/14/2021  Hearing? N N  Vision? N Y  Difficulty concentrating or making decisions? N N  Walking or climbing stairs? N N  Dressing or bathing? N N  Doing errands, shopping? N -  Preparing Food and eating ? N -  Using the Toilet? N -  In the past six months, have you accidently leaked urine? N -  Do you have problems with loss of bowel control? N -  Managing your Medications? N -  Managing your Finances? N -  Housekeeping or managing your Housekeeping? N -  Some recent data might be hidden    Patient Care Team: Marin Olp, MD as PCP - General (Family Medicine) Sherren Mocha, MD as PCP - Cardiology (Cardiology) Bensimhon, Shaune Pascal, MD (Cardiology) Tanda Rockers, MD as Consulting Physician (Pulmonary Disease) Jamesetta Geralds, [place] (Chiropractic Medicine) Leta Baptist, MD as Consulting Physician (Otolaryngology) Madelin Rear, [facility] (Pharmacist) Madelin Rear, [facility] as Pharmacist (Pharmacist)  Indicate any recent Medical Services you may have received from other than [facility] providers in the past year (date may be approximate).     Assessment:   This is a routine wellness examination for [place].  Hearing/Vision screen Hearing Screening - Comments:: Pt denies any hearing issues  Vision Screening - Comments:: Pt follows up with  Dr Phineas Douglas for annual eye exams   Dietary issues and exercise activities discussed: Current Exercise Habits: Home exercise routine, Type of exercise: walking, Time (Minutes): 30, Frequency (Times/Week): 5, Weekly Exercise (Minutes/Week): 150   Goals Addressed             This Visit's Progress    Patient Stated       None at this time       Depression Screen PHQ 2/9 Scores 05/19/2021 03/31/2021 01/20/2021 12/23/2020 11/11/2020 10/28/2020 04/21/2020  PHQ - 2 Score 1 6 4 5 5 3 2   PHQ- 9 Score - 14 14 21 21 17 7     Fall Risk Fall Risk  05/19/2021 10/28/2020 04/21/2020 04/16/2019 04/04/2018  Falls in the past year? 1 0 0 1 Yes  Number falls in past yr: 1 0 1 1 1   Injury with Fall? 1 0  0 0 No  Comment ankle broken - - - -  Risk for fall due to : Impaired vision - History of fall(s);Impaired vision - -  Follow up Falls prevention discussed - - Education provided -    FALL RISK PREVENTION PERTAINING TO THE HOME:  Any stairs in or around the home? No  If so, are there any without handrails? No  Home free of loose throw rugs in walkways, pet beds, electrical cords, etc? Yes  Adequate lighting in your home to reduce risk of falls? Yes   ASSISTIVE DEVICES UTILIZED TO PREVENT FALLS:  Life alert? No  Use of a cane, walker or w/c? No  Grab bars in the bathroom? Yes  Shower chair or bench in shower? Yes  Elevated toilet seat or a handicapped toilet? No   TIMED UP AND GO:  Was the test  performed? No .   Cognitive Function:     6CIT Screen 05/19/2021 04/21/2020 04/04/2018  What Year? 0 points 0 points 0 points  What month? 0 points 0 points 0 points  What time? 0 points - 0 points  Count back from 20 0 points 0 points 0 points  Months in reverse 0 points 0 points 0 points  Repeat phrase 0 points 0 points -  Total Score 0 - -    Immunizations Immunization History  Administered Date(s) Administered   Fluad Quad(high Dose 65+) 07/14/2020   Influenza Split 06/19/2011, 07/02/2012   Influenza Whole 08/03/2009   Influenza, High Dose Seasonal PF 05/20/2015, 06/04/2019, 06/04/2019   Influenza,inj,Quad PF,6+ Mos 05/21/2013, 05/14/2014, 06/17/2018   Influenza-Unspecified 08/17/2016, 06/19/2017, 06/17/2018   PFIZER(Purple Top)SARS-COV-2 Vaccination 10/20/2019, 11/09/2019, 06/30/2020   Pneumococcal Conjugate-13 06/14/2014   Pneumococcal Polysaccharide-23 03/22/2016   Td 08/29/2009, 03/17/2020   Zoster Recombinat (Shingrix) 03/10/2018, 07/15/2018   Zoster, Live 01/21/2016    TDAP status: Up to date  Flu Vaccine status: Due, Education has been provided regarding the importance of this vaccine. Advised may receive this vaccine at local pharmacy or Health Dept. Aware to provide a copy of the vaccination record if obtained from local pharmacy or Health Dept. Verbalized acceptance and understanding.  Pneumococcal vaccine status: Up to date  Covid-19 vaccine status: Completed vaccines  Qualifies for Shingles Vaccine? Yes   Zostavax completed Yes   Shingrix Completed?: Yes  Screening Tests Health Maintenance  Topic Date Due   COVID-19 Vaccine (4 - Booster for [place] series) 09/22/2020   INFLUENZA VACCINE  04/03/2021   MAMMOGRAM  06/16/2022   COLONOSCOPY (Pts 45-68yr Insurance coverage will need to be confirmed)  07/14/2024   DEXA SCAN  Completed   Hepatitis C Screening  Completed   Zoster Vaccines- Shingrix  Completed   HPV VACCINES  Aged Out   TETANUS/TDAP   Discontinued    Health Maintenance  Health Maintenance Due  Topic Date Due   COVID-19 Vaccine (4 - Booster for [place]) 09/22/2020   INFLUENZA VACCINE  04/03/2021     Colorectal cancer screening: Type of screening: Colonoscopy. Completed 03/09/20. Repeat every 10 years  Mammogram status: Completed 06/16/20. Repeat every year  Bone Density status: Completed 03/09/20. Results reflect: Bone density results: OSTEOPENIA. Repeat every 2-3 years.  Additional Screening:  Hepatitis C Screening:  Completed 12/21/15  Vision Screening: Recommended annual ophthalmology exams for early detection of glaucoma and other disorders of the eye. Is the patient up to date with their annual eye exam?  Yes  Who is the provider or what is the name of the office  in which the patient attends annual eye exams? Dr Phineas Douglas  If pt is not established with a provider, would they like to be referred to a provider to establish care? No .   Dental Screening: Recommended annual dental exams for proper oral hygiene  Community Resource Referral / Chronic Care Management: CRR required this visit?  No   CCM required this visit?  No      Plan:     I have personally reviewed and noted the following in the patient's chart:   Medical and social history Use of alcohol, tobacco or illicit drugs  Current medications and supplements including opioid prescriptions.  Functional ability and status Nutritional status Physical activity Advanced directives List of other physicians Hospitalizations, surgeries, and ER visits in previous 12 months Vitals Screenings to include cognitive, depression, and falls Referrals and appointments  In addition, I have reviewed and discussed with patient certain preventive protocols, quality metrics, and best practice recommendations. A written personalized care plan for preventive services as well as general preventive health recommendations were provided to patient.     Willette Brace, LPN   11/16/4006   Nurse Notes: None

## 2021-05-19 NOTE — Patient Instructions (Signed)
[name] , Thank you for taking time to come for your Medicare Wellness Visit. I appreciate your ongoing commitment to your health goals. Please review the following plan we discussed and let me know if I can assist you in the future.   Screening recommendations/referrals: Colonoscopy: done 07/14/14 repeat every 10 years sue 07/18/24 Mammogram: Done 06/16/20 repeat every year  Bone Density: Done 03/09/20 repeat every  2 years  Recommended yearly ophthalmology/optometry visit for glaucoma screening and checkup Recommended yearly dental visit for hygiene and checkup  Vaccinations: Influenza vaccine: Due Pneumococcal vaccine: Completed  Tdap vaccine: completed 03/07/20 Shingles vaccine: Completed 7/8 & 07/05/18   Covid-19:Completed 2/16, 3//8, & 06/30/20  Advanced directives: Please bring a copy of your health care power of attorney and living will to the office at your convenience.  Conditions/risks identified: None at this time   Next appointment: Follow up in one year for your annual wellness visit    Preventive Care 65 Years and Older, Female Preventive care refers to lifestyle choices and visits with your health care provider that can promote health and wellness. What does preventive care include? A yearly physical exam. This is also called an annual well check. Dental exams once or twice a year. Routine eye exams. Ask your health care provider how often you should have your eyes checked. Personal lifestyle choices, including: Daily care of your teeth and gums. Regular physical activity. Eating a healthy diet. Avoiding tobacco and drug use. Limiting alcohol use. Practicing safe sex. Taking low-dose aspirin every day. Taking vitamin and mineral supplements as recommended by your health care provider. What happens during an annual well check? The services and screenings done by your health care provider during your annual well check will depend on your age, overall health,  lifestyle risk factors, and family history of disease. Counseling  Your health care provider may ask you questions about your: Alcohol use. Tobacco use. Drug use. Emotional well-being. Home and relationship well-being. Sexual activity. Eating habits. History of falls. Memory and ability to understand (cognition). Work and work Statistician. Reproductive health. Screening  You may have the following tests or measurements: Height, weight, and BMI. Blood pressure. Lipid and cholesterol levels. These may be checked every 5 years, or more frequently if you are over 66 years old. Skin check. Lung cancer screening. You may have this screening every year starting at age 35 if you have a 30-pack-year history of smoking and currently smoke or have quit within the past 15 years. Fecal occult blood test (FOBT) of the stool. You may have this test every year starting at age 54. Flexible sigmoidoscopy or colonoscopy. You may have a sigmoidoscopy every 5 years or a colonoscopy every 10 years starting at age 26. Hepatitis C blood test. Hepatitis B blood test. Sexually transmitted disease (STD) testing. Diabetes screening. This is done by checking your blood sugar (glucose) after you have not eaten for a while (fasting). You may have this done every 1-3 years. Bone density scan. This is done to screen for osteoporosis. You may have this done starting at age 31. Mammogram. This may be done every 1-2 years. Talk to your health care provider about how often you should have regular mammograms. Talk with your health care provider about your test results, treatment options, and if necessary, the need for more tests. Vaccines  Your health care provider may recommend certain vaccines, such as: Influenza vaccine. This is recommended every year. Tetanus, diphtheria, and acellular pertussis (Tdap, Td) vaccine. You may  need a Td booster every 10 years. Zoster vaccine. You may need this after age  35. Pneumococcal 13-valent conjugate (PCV13) vaccine. One dose is recommended after age 78. Pneumococcal polysaccharide (PPSV23) vaccine. One dose is recommended after age 72. Talk to your health care provider about which screenings and vaccines you need and how often you need them. This information is not intended to replace advice given to you by your health care provider. Make sure you discuss any questions you have with your health care provider. Document Released: 09/16/2015 Document Revised: 05/09/2016 Document Reviewed: 06/21/2015 Elsevier Interactive Patient Education  2017 [place] Prevention in the Home Falls can cause injuries. They can happen to people of all ages. There are many things you can do to make your home safe and to help prevent falls. What can I do on the outside of my home? Regularly fix the edges of walkways and driveways and fix any cracks. Remove anything that might make you trip as you walk through a door, such as a raised step or threshold. Trim any bushes or trees on the path to your home. Use bright outdoor lighting. Clear any walking paths of anything that might make someone trip, such as rocks or tools. Regularly check to see if handrails are loose or broken. Make sure that both sides of any steps have handrails. Any raised decks and porches should have guardrails on the edges. Have any leaves, snow, or ice cleared regularly. Use sand or salt on walking paths during winter. Clean up any spills in your garage right away. This includes oil or grease spills. What can I do in the bathroom? Use night lights. Install grab bars by the toilet and in the tub and shower. Do not use towel bars as grab bars. Use non-skid mats or decals in the tub or shower. If you need to sit down in the shower, use a plastic, non-slip stool. Keep the floor dry. Clean up any water that spills on the floor as soon as it happens. Remove soap buildup in the tub or shower  regularly. Attach bath mats securely with double-sided non-slip rug tape. Do not have throw rugs and other things on the floor that can make you trip. What can I do in the bedroom? Use night lights. Make sure that you have a light by your bed that is easy to reach. Do not use any sheets or blankets that are too big for your bed. They should not hang down onto the floor. Have a firm chair that has side arms. You can use this for support while you get dressed. Do not have throw rugs and other things on the floor that can make you trip. What can I do in the kitchen? Clean up any spills right away. Avoid walking on wet floors. Keep items that you use a lot in easy-to-reach places. If you need to reach something above you, use a strong step stool that has a grab bar. Keep electrical cords out of the way. Do not use floor polish or wax that makes floors slippery. If you must use wax, use non-skid floor wax. Do not have throw rugs and other things on the floor that can make you trip. What can I do with my stairs? Do not leave any items on the stairs. Make sure that there are handrails on both sides of the stairs and use them. Fix handrails that are broken or loose. Make sure that handrails are as long as the stairways.  Check any carpeting to make sure that it is firmly attached to the stairs. Fix any carpet that is loose or worn. Avoid having throw rugs at the top or bottom of the stairs. If you do have throw rugs, attach them to the floor with carpet tape. Make sure that you have a light switch at the top of the stairs and the bottom of the stairs. If you do not have them, ask someone to add them for you. What else can I do to help prevent falls? Wear shoes that: Do not have high heels. Have rubber bottoms. Are comfortable and fit you well. Are closed at the toe. Do not wear sandals. If you use a stepladder: Make sure that it is fully opened. Do not climb a closed stepladder. Make sure that  both sides of the stepladder are locked into place. Ask someone to hold it for you, if possible. Clearly mark and make sure that you can see: Any grab bars or handrails. First and last steps. Where the edge of each step is. Use tools that help you move around (mobility aids) if they are needed. These include: Canes. Walkers. Scooters. Crutches. Turn on the lights when you go into a dark area. Replace any light bulbs as soon as they burn out. Set up your furniture so you have a clear path. Avoid moving your furniture around. If any of your floors are uneven, fix them. If there are any pets around you, be aware of where they are. Review your medicines with your doctor. Some medicines can make you feel dizzy. This can increase your chance of falling. Ask your doctor what other things that you can do to help prevent falls. This information is not intended to replace advice given to you by your health care provider. Make sure you discuss any questions you have with your health care provider. Document Released: 06/16/2009 Document Revised: 01/26/2016 Document Reviewed: 09/24/2014 Elsevier Interactive Patient Education  2017 [employer].

## 2021-05-24 ENCOUNTER — Other Ambulatory Visit: Payer: Self-pay | Admitting: Family Medicine

## 2021-05-24 NOTE — Telephone Encounter (Signed)
Pt requesting Lorazepam 0.5 mg tab LOV: 03/31/2021 Future Visits: 06/01/2022 AWV Last refill: 04/03/2021  Please Advise.

## 2021-05-27 ENCOUNTER — Emergency Department (HOSPITAL_BASED_OUTPATIENT_CLINIC_OR_DEPARTMENT_OTHER): Payer: PPO

## 2021-05-27 ENCOUNTER — Other Ambulatory Visit: Payer: Self-pay

## 2021-05-27 ENCOUNTER — Encounter (HOSPITAL_BASED_OUTPATIENT_CLINIC_OR_DEPARTMENT_OTHER): Payer: Self-pay

## 2021-05-27 ENCOUNTER — Emergency Department (HOSPITAL_BASED_OUTPATIENT_CLINIC_OR_DEPARTMENT_OTHER)
Admission: EM | Admit: 2021-05-27 | Discharge: 2021-05-28 | Disposition: A | Discharge status: Home / Self Care | Payer: PPO | Attending: Emergency Medicine | Admitting: Emergency Medicine

## 2021-05-27 DIAGNOSIS — Z7951 Long term (current) use of inhaled steroids: Secondary | ICD-10-CM | POA: Diagnosis not present

## 2021-05-27 DIAGNOSIS — I2510 Atherosclerotic heart disease of native coronary artery without angina pectoris: Secondary | ICD-10-CM | POA: Diagnosis not present

## 2021-05-27 DIAGNOSIS — J449 Chronic obstructive pulmonary disease, unspecified: Secondary | ICD-10-CM | POA: Insufficient documentation

## 2021-05-27 DIAGNOSIS — Z79899 Other long term (current) drug therapy: Secondary | ICD-10-CM | POA: Insufficient documentation

## 2021-05-27 DIAGNOSIS — Y92003 Bedroom of unspecified non-institutional (private) residence as the place of occurrence of the external cause: Secondary | ICD-10-CM | POA: Insufficient documentation

## 2021-05-27 DIAGNOSIS — Z87891 Personal history of nicotine dependence: Secondary | ICD-10-CM | POA: Insufficient documentation

## 2021-05-27 DIAGNOSIS — Z7982 Long term (current) use of aspirin: Secondary | ICD-10-CM | POA: Insufficient documentation

## 2021-05-27 DIAGNOSIS — W06XXXA Fall from bed, initial encounter: Secondary | ICD-10-CM | POA: Insufficient documentation

## 2021-05-27 DIAGNOSIS — Z951 Presence of aortocoronary bypass graft: Secondary | ICD-10-CM | POA: Insufficient documentation

## 2021-05-27 DIAGNOSIS — S42351A Displaced comminuted fracture of shaft of humerus, right arm, initial encounter for closed fracture: Secondary | ICD-10-CM | POA: Diagnosis not present

## 2021-05-27 DIAGNOSIS — S60811A Abrasion of right wrist, initial encounter: Secondary | ICD-10-CM | POA: Diagnosis not present

## 2021-05-27 DIAGNOSIS — S42254A Nondisplaced fracture of greater tuberosity of right humerus, initial encounter for closed fracture: Secondary | ICD-10-CM | POA: Insufficient documentation

## 2021-05-27 DIAGNOSIS — I10 Essential (primary) hypertension: Secondary | ICD-10-CM | POA: Insufficient documentation

## 2021-05-27 DIAGNOSIS — S4991XA Unspecified injury of right shoulder and upper arm, initial encounter: Secondary | ICD-10-CM | POA: Diagnosis present

## 2021-05-27 MED ORDER — TRAMADOL HCL 50 MG PO TABS
50.0000 mg | ORAL_TABLET | Freq: Once | ORAL | Status: AC
  Administered 2021-05-28: 50 mg via ORAL
  Filled 2021-05-27: qty 1

## 2021-05-27 NOTE — ED Provider Notes (Signed)
[facility] Provider Note   CSN: [id number] Arrival date & time: 05/27/21  2319     History Chief Complaint  Patient presents with   Shoulder Injury    Katherine Foley is a 69 y.o. female.  The history is provided by the patient.  Shoulder Injury This is a new problem. The current episode started 1 to 2 hours ago. The problem occurs constantly. The problem has not changed since onset.Associated symptoms comments: Right shoulder pain.  Unable to move without severe pain.  Was standing on the bed fixing the curtain and she fell off the bed hitting her shoulder on the wall and then falling to the floor.  Abrasions to the right elbow and wrist.  No hand/wrist or elbow pain.  No head injury or LOC.  Denies anticoagulation use.  No leg pain and walking normally.. The symptoms are aggravated by bending and twisting. The symptoms are relieved by rest. She has tried rest for the symptoms. The treatment provided no relief.      Past Medical History:  Diagnosis Date   Adenomatous colon polyp    Anxiety    ANXIETY DEPRESSION 02/15/2009   AORTIC STENOSIS 07/07/2010   a. echo 12/23/10: EF 60-65%, mod to severe AS with mean gradient 29 mmHg;  b. 04/2011 s/p bioprosthetic AVR. // Echo 9/19:  Mild conc LVH, EF 60-65, no RWMA, Gr 1 DD, AVR ok (mean 10, peak 21), trivial MR, normal RVSF, mild TR    Barrett's esophagus 04/14/2007   Barrett's esophagus 2000   CAD, NATIVE VESSEL 05/17/2010   a. NSTEMI 8/11 - BMS to RCA;  b. cath 4/12: dLAD 50%, RI 80% (PCI), AVCFX 30%, pRCA 30%, stent ok, 40-50, then 50% dist RCA;  c. s/p Promus DES to RI 12/25/10;  d. 04/2011 CABG x 1 (LIMA->LAD) @ time of AVR   Carotid stenosis 05/17/2010   a. 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   Exertional shortness of breath    "related to COPD problems"  (03/25/2013)   Family history of breast cancer    2 daughters; PALB2 mutation in family   Genetic testing 02/10/2018   PALB2 analysis at [facility] - Negative for familial mutation   GERD (gastroesophageal reflux disease)    H/O hiatal hernia    Hemorrhoid thrombosis    "had it lanced" (03/25/2013)   Herpes simplex without mention of complication 53/64/6803   History of blood transfusion    'w/OHS" (03/25/2013)   HYPERLIPIDEMIA, MILD 08/29/2009   HYPERTENSION 04/14/2007   MIGRAINE HEADACHE 04/20/2008   Myocardial infarction ([place]) 2011   "during catherization" (03/25/2013)   OSTEOPENIA 04/14/2007   Ovarian cyst    Parotid mass    left   Pneumonia    "multiple times" (03/25/2013)   URINARY INCONTINENCE, STRESS, MILD 08/26/2007   Urine, incontinence, stress female     Patient Active Problem List   Diagnosis Date Noted   Aortic atherosclerosis ([place]) 03/10/2020   Restless legs 08/25/2019   H/O parotidectomy 04/28/2019   Genetic testing 02/10/2018   Family history of breast cancer    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   Hot flashes 03/22/2016   Allergic rhinitis 09/22/2015   Depression 09/22/2015   Former smoker 09/22/2015   Adenomatous colon polyp    Diastolic dysfunction 21/22/4825   GERD (gastroesophageal reflux disease) 06/04/2014   S/P  AVR (aortic valve replacement) 11/06/2013   Aortic stenosis 07/07/2010   CAD (coronary artery disease) 05/17/2010   Carotid stenosis 05/17/2010   Herpes simplex virus (HSV) infection 01/16/2010   HLD (hyperlipidemia) 08/29/2009   Chronic obstructive pulmonary disease ([place]) 07/23/2007   Essential hypertension 04/14/2007   Osteopenia 04/14/2007    Past Surgical History:  Procedure Laterality Date   AORTIC VALVE REPLACEMENT  2012   APPENDECTOMY     BLEPHAROPLASTY Bilateral ~ [place] N/A 07/06/2016   Procedure: Left Heart Cath and Cors/Grafts Angiography;  Surgeon: Sherren Mocha, MD;  Location: [facility];   Service: Cardiovascular;  Laterality: N/A;   CHOLECYSTECTOMY N/A 03/25/2013   Procedure: LAPAROSCOPIC CHOLECYSTECTOMY WITH INTRAOPERATIVE CHOLANGIOGRAM;  Surgeon: Imogene Burn. Georgette Dover, MD;  Location: [facility];  Service: General;  Laterality: N/A;   CORONARY ANGIOGRAPHY N/A 05/26/2018   Procedure: CORONARY ANGIOGRAPHY (CATH LAB);  Surgeon: Jettie Booze, MD;  Location: [facility];  Service: Cardiovascular;  Laterality: N/A;   CORONARY ANGIOPLASTY WITH STENT PLACEMENT  2010   "I have 2 stents" (03/25/2013)   [place]; 1998   "cust in maxillary sinus; put a stent in then removed it" (03/25/2013)   ORIF ANKLE FRACTURE Right 02/14/2021   Procedure: OPEN TREATMENT OF RIGHT TRIMALLEOLAR ANKLE FRACTURE WITHOUT POSTERIOR FIXATION, SYNDESMOSIS;  Surgeon: Erle Crocker, MD;  Location: [facility];  Service: Orthopedics;  Laterality: Right;   OVARIAN CYST REMOVAL Right 2011   "size of a soccer ball" (03/25/2013)   PAROTIDECTOMY Left 04/28/2019   Procedure: LEFT TOTAL PAROTIDECTOMY;  Surgeon: Leta Baptist, MD;  Location: [facility];  Service: ENT;  Laterality: Left;   thrombosed hemorrohid lanced     TONSILLECTOMY AND ADENOIDECTOMY     TOTAL ABDOMINAL HYSTERECTOMY       OB History   No obstetric history on file.     Family History  Problem Relation Age of Onset   Coronary artery disease Other    Heart disease Other        6 stents   Heart disease Mother    COPD Father        smoked   Heart disease Father    Rheum arthritis Father    Heart disease Maternal Uncle    Breast cancer Daughter        currently 48   Asthma Son        as a child    Breast cancer Daughter 14       currently 48; PALB2 mutation   Cancer Other        very distant paternal cousin; pancreatic ca   Cirrhosis Sister    Stroke Brother    Colon cancer Neg Hx    Colon polyps Neg Hx     Social History   Tobacco Use   Smoking status: Former    Packs/day: 1.50     Years: 44.00    Pack years: 66.00    Types: Cigarettes    Quit date: 09/03/2005    Years since quitting: 15.7   Smokeless tobacco: Never  Vaping Use   Vaping Use: Never used  Substance Use Topics   Alcohol use: Not Currently    Alcohol/week: 0.0 standard drinks    Comment: social   Drug use: No    Home Medications Prior to Admission medications   Medication Sig Start Date End Date Taking? Authorizing Provider  traMADol (ULTRAM) 50 MG tablet Take  1 tablet (50 mg total) by mouth every 6 (six) hours as needed. 05/28/21  Yes Major Santerre, Loree Fee, MD  Albuterol Sulfate (PROAIR RESPICLICK) 854 (90 Base) MCG/ACT AEPB Inhale 1 puff into the lungs every 6 (six) hours as needed (wheezing or shortness of breath). 12/23/20   Marin Olp, MD  amLODipine (NORVASC) 5 MG tablet Take 1 tablet (5 mg total) by mouth daily. 09/20/20   Marin Olp, MD  aspirin EC 81 MG tablet Take 1 tablet (81 mg total) by mouth daily. 06/17/19   Richardson Dopp T, PA-C  buPROPion (WELLBUTRIN XL) 300 MG 24 hr tablet Take 1 tablet (300 mg total) by mouth daily. 01/20/21   Marin Olp, MD  Calcium Carb-Cholecalciferol (CALCIUM 500/D PO) Take by mouth. Mag and zinc added    [provider]  cetirizine (ZYRTEC) 10 MG tablet Take 10 mg by mouth at bedtime.    [provider]  desvenlafaxine (PRISTIQ) 100 MG 24 hr tablet TAKE ONE TABLET BY MOUTH EVERY MORNING 05/15/21   Marin Olp, MD  dexlansoprazole (DEXILANT) 60 MG capsule Take 1 capsule (60 mg total) by mouth daily. 09/20/20   Marin Olp, MD  ezetimibe (ZETIA) 10 MG tablet Take 1 tablet (10 mg total) by mouth daily. 09/28/20   Richardson Dopp T, PA-C  fluticasone Asencion Islam) 50 MCG/ACT nasal spray Use 2 spray(s) in each nostril once daily 02/17/20   Marin Olp, MD  gabapentin (NEURONTIN) 300 MG capsule TAKE ONE CAPSULE BY MOUTH EVERYDAY AT BEDTIME 05/15/21   Marin Olp, MD  LORazepam (ATIVAN) 0.5 MG tablet Take ONE tablet by MOUTH  twice daily as needed FOR anxiety(do not dive EIGHT hours AFTER using) 05/24/21   Marin Olp, MD  Multiple Vitamins-Minerals (MULTIVITAMIN ADULT PO) Take 1 tablet by mouth daily.     [provider]  nitroGLYCERIN (NITROSTAT) 0.4 MG SL tablet Place 1 tablet (0.4 mg total) under the tongue every 5 (five) minutes as needed for chest pain (if pain does not resolve with 2 doses, call 911). 11/09/19   Marin Olp, MD  propranolol (INDERAL) 20 MG tablet Take 1 tablet (20 mg total) by mouth 2 (two) times daily. 09/20/20   Marin Olp, MD  rosuvastatin (CRESTOR) 40 MG tablet Take 1 tablet (40 mg total) by mouth daily. 09/20/20   Marin Olp, MD  SPIRIVA HANDIHALER 18 MCG inhalation capsule Place 1 capsule (18 mcg total) into inhaler and inhale 2 (two) times daily. Patient taking differently: Place 18 mcg into inhaler and inhale daily. 09/20/20   Marin Olp, MD  SYMBICORT 160-4.5 MCG/ACT inhaler Inhale 2 puffs into the lungs 2 (two) times daily. 01/11/21   Marin Olp, MD  valACYclovir (VALTREX) 1000 MG tablet Take 2 pills twice a day for 1 day at first sign of cold sore 10/28/20   Marin Olp, MD  Aclidinium Bromide (TUDORZA PRESSAIR) 400 MCG/ACT AEPB Inhale 1 puff into the lungs 2 (two) times daily. 09/11/11 06/16/18  Ricard Dillon, MD    Allergies    Codeine sulfate and Hydrocodone-acetaminophen  Review of Systems   Review of Systems  Neurological:  Negative for numbness.  All other systems reviewed and are negative.  Physical Exam Updated Vital Signs BP (!) 142/79 (BP Location: Right Arm)   Pulse 68   Temp 97.6 F (36.4 C) (Oral)   Resp 18   Ht 5' 2"  (1.575 m)   Wt 52.6 kg   SpO2  97%   BMI 21.22 kg/m   Physical Exam Vitals and nursing note reviewed.  Constitutional:      General: She is not in acute distress.    Appearance: Normal appearance. She is normal weight.  HENT:     Head: Normocephalic.     Nose: Nose normal.     Mouth/Throat:      Mouth: Mucous membranes are moist.  Eyes:     Extraocular Movements: Extraocular movements intact.     Pupils: Pupils are equal, round, and reactive to light.  Cardiovascular:     Rate and Rhythm: Normal rate.     Pulses: Normal pulses.  Pulmonary:     Effort: Pulmonary effort is normal.  Chest:     Chest wall: No tenderness.  Musculoskeletal:        General: Tenderness and signs of injury present.     Right shoulder: Tenderness and bony tenderness present. Decreased range of motion.     Right elbow: Normal.     Right wrist: Normal.     Right hand: Normal.       Arms:     Cervical back: Normal range of motion and neck supple. No tenderness.     Comments: 2+ right radial pulse.  Sensation in the right hand normal and 5/5 grip strength.  No clavicle pain  Neurological:     Mental Status: She is alert.    ED Results / Procedures / Treatments   Labs (all labs ordered are listed, but only abnormal results are displayed) Labs Reviewed - No data to display  EKG None  Radiology DG Shoulder Right  Result Date: 05/27/2021 CLINICAL DATA:  Pain after fall EXAM: RIGHT SHOULDER - 2+ VIEW COMPARISON:  None. FINDINGS: Mild AC joint degenerative change. Acute mildly comminuted fracture involving the right humeral neck and greater tuberosity. No humeral head dislocation. IMPRESSION: Acute mildly comminuted fracture involving the right humeral neck and greater tuberosity Electronically Signed   By: Donavan Foil M.D.   On: 05/27/2021 23:53    Procedures Procedures   Medications Ordered in ED Medications  traMADol (ULTRAM) tablet 50 mg (50 mg Oral Given 05/28/21 0002)    ED Course  I have reviewed the triage vital signs and the nursing notes.  Pertinent labs & imaging results that were available during my care of the patient were reviewed by me and considered in my medical decision making (see chart for details).    MDM Rules/Calculators/A&P                           Pt with a  fall off the bed tonight now with c/o of right shoulder pain and mild deformity and swelling.  No hand injury and NV intact.  No head injury or LOC.  No anticoagulation.  Pt is ambulatory and no pain in the legs.  Plain films acute mildly comminuted fracture involving the right humeral neck and greater tuberosity.  Patient placed in a sling, given pain control and superficial wounds cleaned and dressed.  MDM   Amount and/or Complexity of Data Reviewed Tests in the radiology section of CPT: ordered and reviewed Independent visualization of images, tracings, or specimens: yes    Final Clinical Impression(s) / ED Diagnoses Final diagnoses:  Closed nondisplaced fracture of greater tuberosity of right humerus, initial encounter    Rx / DC Orders ED Discharge Orders          Ordered  traMADol (ULTRAM) 50 MG tablet  Every 6 hours PRN        05/28/21 0010             Blanchie Dessert, MD 05/28/21 0013

## 2021-05-27 NOTE — ED Triage Notes (Addendum)
Pt is present for right shoulder pain after a fall this evening. Pt was fixing her curtains in her bedroom and fell off of her bed. Pt denies any other injuries. Denies hitting her head. Arm currently in a sling from EMS.

## 2021-05-28 DIAGNOSIS — S42254A Nondisplaced fracture of greater tuberosity of right humerus, initial encounter for closed fracture: Secondary | ICD-10-CM | POA: Diagnosis not present

## 2021-05-28 MED ORDER — TRAMADOL HCL 50 MG PO TABS: 50.0000 mg | ORAL_TABLET | Freq: Four times a day (QID) | ORAL | 0 refills | Status: DC | PRN

## 2021-05-28 NOTE — Discharge Instructions (Addendum)
Wear your sling at all times except you can take it off to change her clothes and bathe.  The small wounds on your wrist and elbow will heal on their own.  You can change the bandage daily and place a small amount of Vaseline to keep it from sticking.  Take the pain medication as needed.  Do not lift anything with the right arm.

## 2021-05-31 DIAGNOSIS — M25511 Pain in right shoulder: Secondary | ICD-10-CM | POA: Diagnosis not present

## 2021-05-31 DIAGNOSIS — S42201A Unspecified fracture of upper end of right humerus, initial encounter for closed fracture: Secondary | ICD-10-CM | POA: Diagnosis not present

## 2021-06-12 ENCOUNTER — Encounter: Payer: Self-pay | Admitting: Physician Assistant

## 2021-06-12 ENCOUNTER — Ambulatory Visit (HOSPITAL_COMMUNITY): Payer: PPO | Attending: Cardiovascular Disease

## 2021-06-12 ENCOUNTER — Other Ambulatory Visit: Payer: Self-pay

## 2021-06-12 DIAGNOSIS — Z952 Presence of prosthetic heart valve: Secondary | ICD-10-CM | POA: Diagnosis not present

## 2021-06-12 LAB — ECHOCARDIOGRAM COMPLETE
AV Mean grad: 24.2 mmHg
AV Peak grad: 39.2 mmHg
Ao pk vel: 3.13 m/s
Area-P 1/2: 3.68 cm2
S' Lateral: 2.5 cm

## 2021-06-13 ENCOUNTER — Other Ambulatory Visit: Payer: Self-pay | Admitting: *Deleted

## 2021-06-13 DIAGNOSIS — Z952 Presence of prosthetic heart valve: Secondary | ICD-10-CM

## 2021-06-14 DIAGNOSIS — Z9889 Other specified postprocedural states: Secondary | ICD-10-CM | POA: Diagnosis not present

## 2021-06-21 ENCOUNTER — Ambulatory Visit: Payer: PPO

## 2021-06-21 ENCOUNTER — Encounter: Payer: Self-pay | Admitting: Physician Assistant

## 2021-06-21 ENCOUNTER — Ambulatory Visit: Payer: PPO | Admitting: Physician Assistant

## 2021-06-21 ENCOUNTER — Other Ambulatory Visit: Payer: Self-pay

## 2021-06-21 VITALS — BP 110/50 | HR 62 | Ht 62.5 in | Wt 120.2 lb

## 2021-06-21 DIAGNOSIS — I2510 Atherosclerotic heart disease of native coronary artery without angina pectoris: Secondary | ICD-10-CM | POA: Diagnosis not present

## 2021-06-21 DIAGNOSIS — I10 Essential (primary) hypertension: Secondary | ICD-10-CM

## 2021-06-21 DIAGNOSIS — I359 Nonrheumatic aortic valve disorder, unspecified: Secondary | ICD-10-CM | POA: Diagnosis not present

## 2021-06-21 DIAGNOSIS — Z952 Presence of prosthetic heart valve: Secondary | ICD-10-CM | POA: Diagnosis not present

## 2021-06-21 DIAGNOSIS — E785 Hyperlipidemia, unspecified: Secondary | ICD-10-CM

## 2021-06-21 NOTE — Progress Notes (Signed)
Cardiology Office Note:    Date:  06/21/2021   ID:  Paticia Foley, DOB 29-Oct-1950, MRN [id number]  PCP:  Marin Olp, MD   [facility] Providers Cardiologist:  Sherren Mocha, MD Cardiology APP:  Sharmon [place]     Referring MD: Marin Olp, MD   Chief Complaint:  F/u for CAD, AS s/p AVR    Patient Profile:   Katherine Foley is a 70 y.o. female with:  Coronary artery disease and Aortic Valve Dz s/p CABG/AVR Coronary artery disease  NSTEMI in 2011 >> BMS to RCA  S/p DES to RI in 2012 S/p CABG in 2012 (L-LAD) Cath in 9/19: RI and RCA stents ok; L-LAD atretic, mild disease in mLAD >> med Rx Aortic valve Dz S/p bioprosthetic AVR in 2012 Echocardiogram 10/22: EF 60-65, AVR gradient ? 10 mmHg in 2019 >> 24 mmHg in 2022 Asthma/COPD  Hypertension  Hyperlipidemia  L parotidectomy (path negative) Carotid artery disease [place] 8/21: Bilateral ICA 1-39; R subclavian stenosis   Prior CV studies: Echocardiogram 06/12/21 EF 60-65, no RWMA, normal RVSF, AVR with increased gradient 24 mmHg (increased from 10 mmHg in 2019)  Carotid US 04/07/2020 Bilateral ICA 1-39; right subclavian stenosis   Cardiac Catheterization 05/26/18 LAD mid 48 RI stent patent LCx mid 25 RCA ost 30, prox stent patent with 25 ISR, dist 25 LIMA known to be atretic   Echo 05/23/18 Mild conc LVH, EF 60-65, no RWMA, Gr 1 DD, s/p AVR without stenosis or significant regurgitation (peak 21), no RVSF, trivial MR, mild TR   Echo 12/26/16 EF 65-70, no RWMA, Gr 1 DD, AVR ok with mean 11, trivial TR   Cardiac Catheterization 07/06/16 LM ok LAD mid 76 RI stent patent PCx prox 30 RCA mid stent patent with 30 ISR L-LAD atretic    Echo 12/02/15 EF 65-70, no RWMA, Gr 1 DD, normally functioning AVR (mean 16), trivial MR, trivial TR   Carotid US 12/02/15  1-39% bilateral ICA stenosis. FU as needed    Nuclear stress test 12/02/15 EF 70.  Normal pharmacologic nuclear stress test with no  evidence of prior infarct or ischemia.    History of Present Illness: [name] was last seen in 10/21.  She returns for follow-up.  She did have a follow-up echocardiogram 06/12/2021 which demonstrated normal EF.  Her AVR gradients did increase from 10 on the last study to 24 mmHg on this study.  A follow-up echocardiogram is planned in 1 year.  She is here alone today.  She fell off her bed trying to change the curtains and broke her right shoulder.  She has not had syncope or near syncope.  She is currently in a sling.  She has not had chest pain or significant shortness of breath.  She has not had orthopnea.  She did fracture her right ankle falling down some steps several months ago.  She required surgery for this.  Her right ankle still swells somewhat.  Otherwise, she has not had any leg edema.        Past Medical History:  Diagnosis Date   Adenomatous colon polyp    Anxiety    Anxiety/Depression 02/15/2009   Aortic stenosis s/p bioprosthetic AVR 07/07/2010   Echo 9/19:  Mild conc LVH, EF 60-65, no RWMA, Gr 1 DD, AVR ok (mean 10, peak 21), trivial MR, normal RVSF, mild TR // Echo 10/22: EF 60-65, no RWMA, normal RVSF, AVR with increased gradients (mean 24 increased from  10 in 2019)   Barrett's esophagus    04/2007; 2000   Carotid stenosis 05/17/2010   a. dopplers 10/27: RICA 25-36%; LICA 6-44% (follow up due 11/12)   COPD 07/23/2007   Coronary Artery Disease 05/17/2010   NSTEMI 8/11 - BMS to RCA;  cath 4/12: dLAD 50%, RI 80% (PCI), AVCFX 30%, pRCA 30%, stent ok, 40-50, then 50% dist RCA;  c. s/p Promus DES to RI 12/25/10;  d. 04/2011 CABG x 1 (LIMA->LAD) @ time of AVR // Cath in 9/19: RI and RCA stents ok; L-LAD atretic, mild disease in mLAD >> med Rx   Family history of breast cancer    2 daughters; PALB2 mutation in family   Genetic testing 02/10/2018   PALB2 analysis at [facility] - Negative for familial mutation   GERD    H/O hiatal hernia    Hemorrhoid thrombosis    "had it lanced"  (03/25/2013)   Herpes simplex without mention of complication 03/47/4259   History of blood transfusion    'w/OHS" (03/25/2013)   Hyperlipidemia 08/29/2009   Hypertension 04/14/2007   Migraine HAs 04/20/2008   Myocardial infarction [facility]) 2011   "during catherization" (03/25/2013)   OSTEOPENIA 04/14/2007   Ovarian cyst    Parotid mass    left   Pneumonia    "multiple times" (03/25/2013)   Urine, incontinence, stress female    Current Medications: Current Meds  Medication Sig   Albuterol Sulfate (PROAIR RESPICLICK) 563 (90 Base) MCG/ACT AEPB Inhale 1 puff into the lungs every 6 (six) hours as needed (wheezing or shortness of breath).   amLODipine (NORVASC) 5 MG tablet Take 1 tablet (5 mg total) by mouth daily.   aspirin EC 81 MG tablet Take 1 tablet (81 mg total) by mouth daily.   buPROPion (WELLBUTRIN XL) 300 MG 24 hr tablet Take 1 tablet (300 mg total) by mouth daily.   Calcium Carb-Cholecalciferol (CALCIUM 500/D PO) Take 2 tablets by mouth daily. Mag and zinc added   cetirizine (ZYRTEC) 10 MG tablet Take 10 mg by mouth at bedtime.   desvenlafaxine (PRISTIQ) 100 MG 24 hr tablet TAKE ONE TABLET BY MOUTH EVERY MORNING   dexlansoprazole (DEXILANT) 60 MG capsule Take 1 capsule (60 mg total) by mouth daily.   ezetimibe (ZETIA) 10 MG tablet Take 1 tablet (10 mg total) by mouth daily.   fluticasone (FLONASE) 50 MCG/ACT nasal spray Place 2 sprays into both nostrils as needed for allergies or rhinitis.   gabapentin (NEURONTIN) 300 MG capsule TAKE ONE CAPSULE BY MOUTH EVERYDAY AT BEDTIME   LORazepam (ATIVAN) 0.5 MG tablet Take ONE tablet by MOUTH twice daily as needed FOR anxiety(do not dive EIGHT hours AFTER using)   Multiple Vitamins-Minerals (MULTIVITAMIN ADULT PO) Take 1 tablet by mouth daily.    nitroGLYCERIN (NITROSTAT) 0.4 MG SL tablet Place 1 tablet (0.4 mg total) under the tongue every 5 (five) minutes as needed for chest pain (if pain does not resolve with 2 doses, call 911).    propranolol (INDERAL) 20 MG tablet Take 1 tablet (20 mg total) by mouth 2 (two) times daily.   rosuvastatin (CRESTOR) 40 MG tablet Take 1 tablet (40 mg total) by mouth daily.   SYMBICORT 160-4.5 MCG/ACT inhaler Inhale 2 puffs into the lungs 2 (two) times daily.   tiotropium (SPIRIVA HANDIHALER) 18 MCG inhalation capsule Place 18 mcg into inhaler and inhale daily.   traMADol (ULTRAM) 50 MG tablet Take 1 tablet (50 mg total) by mouth every 6 (six) hours as  needed.   valACYclovir (VALTREX) 1000 MG tablet Take 1,000 mg by mouth as needed (for fever blisters).    Allergies:   Codeine sulfate and Hydrocodone-acetaminophen   Social History   Tobacco Use   Smoking status: Former    Packs/day: 1.50    Years: 44.00    Pack years: 66.00    Types: Cigarettes    Quit date: 09/03/2005    Years since quitting: 15.8   Smokeless tobacco: Never  Vaping Use   Vaping Use: Never used  Substance Use Topics   Alcohol use: Not Currently    Alcohol/week: 0.0 standard drinks    Comment: social   Drug use: No    Family Hx: The patient's family history includes Asthma in her son; Breast cancer in her daughter; Breast cancer (age of onset: 53) in her daughter; COPD in her father; Cancer in an other family member; Cirrhosis in her sister; Coronary artery disease in an other family member; Heart disease in her father, maternal uncle, mother, and another family member; Rheum arthritis in her father; Stroke in her brother. There is no history of Colon cancer or Colon polyps.  Review of Systems  Gastrointestinal:  Negative for hematochezia.  Genitourinary:  Negative for hematuria.    EKGs/Labs/Other Test Reviewed:    EKG:  EKG is   ordered today.  The ekg ordered today demonstrates normal sinus rhythm, HR 62, normal axis, nonspecific ST-T wave changes, QTC 448, no change from prior tracing  Recent Labs: 12/27/2020: ALT 39   Recent Lipid Panel Lab Results  Component Value Date/Time   CHOL 131 12/27/2020 03:42  PM   TRIG 81 12/27/2020 03:42 PM   HDL 68 12/27/2020 03:42 PM   LDLCALC 47 12/27/2020 03:42 PM   LDLDIRECT 65.0 06/19/2017 09:01 AM   Risk Assessment/Calculations:        Physical Exam:    VS:  BP (!) 110/50   Pulse 62   Ht 5' 2.5" (1.588 m)   Wt 120 lb 3.2 oz (54.5 kg)   SpO2 91%   BMI 21.63 kg/m     Wt Readings from Last 3 Encounters:  06/21/21 120 lb 3.2 oz (54.5 kg)  05/27/21 116 lb (52.6 kg)  03/31/21 118 lb (53.5 kg)    Constitutional:      Appearance: Healthy appearance. Not in distress.  Neck:     Vascular: JVD normal.  Pulmonary:     Effort: Pulmonary effort is normal.     Breath sounds: No wheezing. No rales.  Cardiovascular:     Normal rate. Regular rhythm. Normal S1. Normal S2.      Murmurs: There is a grade 2/6 crescendo-decrescendo systolic murmur at the URSB.  Edema:    Peripheral edema present.    Ankle: trace edema of the right ankle. Abdominal:     Palpations: Abdomen is soft.  Skin:    General: Skin is warm and dry.  Neurological:     Mental Status: Alert and oriented to person, place and time.     Cranial Nerves: Cranial nerves are intact.     ASSESSMENT & PLAN:   1. Aortic valve disease 2. S/P AVR (aortic valve replacement) Echocardiogram in 10/22 demonstrated increased AVR gradient from 10 mmHg in 2019 to 24 mmHg now.  She is doing well without symptoms to suggest significant aortic stenosis.  She will have another echocardiogram in 1 year and follow-up after that.  Continue SBE prophylaxis.  3. Coronary artery disease involving native coronary artery  of native heart without angina pectoris Status post prior PCI to the RCA and RI and subsequent CABG in 2012.  Cardiac catheterization in 2019 demonstrated patent stents in the RCA and RI, atretic LIMA-LAD and mild nonobstructive disease in the LAD.  She is doing well without angina.  Continue aspirin 81 mg daily, ezetimibe 10 mg daily, rosuvastatin 40 mg daily.  4. Essential hypertension Repeat  blood pressure by me 130/70.  She does get lightheaded sometimes when she stands up quickly.  Have asked her to stand up slowly and make sure she hydrates well.  Continue amlodipine 5 mg daily, propranolol 20 mg twice daily.  5. Hyperlipidemia LDL goal <70 LDL optimal on recent blood work.  Continue rosuvastatin 40 mg daily, ezetimibe 10 mg daily    Dispo:  Return in about 1 year (around 06/21/2022) for Routine follow up in 1 year with Dr.Cooper or Versie Starks..   Medication Adjustments/Labs and Tests Ordered: Current medicines are reviewed at length with the patient today.  Concerns regarding medicines are outlined above.  Tests Ordered: Orders Placed This Encounter  Procedures   EKG 12-Lead   Medication Changes: No orders of the defined types were placed in this encounter.  Signed, Richardson Dopp, PA-C  06/21/2021 4:40 PM    [facility] [place], [place], [place]  [address] Phone: [phone]; Fax: [phone]

## 2021-06-21 NOTE — Patient Instructions (Signed)
Medication Instructions:   Your physician recommends that you continue on your current medications as directed. Please refer to the Current Medication list given to you today.  *If you need a refill on your cardiac medications before your next appointment, please call your pharmacy*   Lab Work:  -NONE  If you have labs (blood work) drawn today and your tests are completely normal, you will receive your results only by: [place] (if you have MyChart) OR A paper copy in the mail If you have any lab test that is abnormal or we need to change your treatment, we will call you to review the results.   Testing/Procedures:  Your physician has requested that you have an echocardiogram. 06/2022 Echocardiography is a painless test that uses sound waves to create images of your heart. It provides your doctor with information about the size and shape of your heart and how well your heart's chambers and valves are working. This procedure takes approximately one hour. There are no restrictions for this procedure.    Follow-Up: At [facility], you and your health needs are our priority.  As part of our continuing mission to provide you with exceptional heart care, we have created designated Provider Care Teams.  These Care Teams include your primary Cardiologist (physician) and Advanced Practice Providers (APPs -  Physician Assistants and Nurse Practitioners) who all work together to provide you with the care you need, when you need it.  We recommend signing up for the patient portal called "MyChart".  Sign up information is provided on this After Visit Summary.  MyChart is used to connect with patients for Virtual Visits (Telemedicine).  Patients are able to view lab/test results, encounter notes, upcoming appointments, etc.  Non-urgent messages can be sent to your provider as well.   To learn more about what you can do with MyChart, go to [email or web address].    Your next appointment:    1 year(s)  The format for your next appointment:   In Person  Provider:   You may see Sherren Mocha, MD or one of the following Advanced Practice Providers on your designated Care Team:   Richardson Dopp, PA-C  Other Instructions  Your physician wants you to follow-up in: 1 year with Dr.Cooper or Richardson Dopp, PA-C.  You will receive a reminder letter in the mail two months in advance. If you don't receive a letter, please call our office to schedule the follow-up appointment.

## 2021-07-12 DIAGNOSIS — M25511 Pain in right shoulder: Secondary | ICD-10-CM | POA: Diagnosis not present

## 2021-07-12 DIAGNOSIS — S42201A Unspecified fracture of upper end of right humerus, initial encounter for closed fracture: Secondary | ICD-10-CM | POA: Diagnosis not present

## 2021-07-19 DIAGNOSIS — S42201D Unspecified fracture of upper end of right humerus, subsequent encounter for fracture with routine healing: Secondary | ICD-10-CM | POA: Diagnosis not present

## 2021-07-19 DIAGNOSIS — M25611 Stiffness of right shoulder, not elsewhere classified: Secondary | ICD-10-CM | POA: Diagnosis not present

## 2021-07-24 ENCOUNTER — Telehealth: Payer: Self-pay

## 2021-07-24 NOTE — Telephone Encounter (Signed)
Pt called regarding the discount forms for her medication that was filled out last year. She stated that she has received the forms for this year already. She stated that she needs the rest filled out. She wants to know if she can bring forms in. Please Advise.

## 2021-07-24 NOTE — Telephone Encounter (Signed)
Ok to drop off

## 2021-07-25 NOTE — Telephone Encounter (Signed)
Pt is aware.  

## 2021-07-31 ENCOUNTER — Ambulatory Visit: Payer: Self-pay | Admitting: Pharmacist

## 2021-07-31 DIAGNOSIS — F3341 Major depressive disorder, recurrent, in partial remission: Secondary | ICD-10-CM

## 2021-07-31 DIAGNOSIS — J449 Chronic obstructive pulmonary disease, unspecified: Secondary | ICD-10-CM

## 2021-07-31 NOTE — Progress Notes (Signed)
  Chronic Care Management   Outreach Note  07/31/2021 Name: Katherine Foley MRN: [id number] DOB: 03-10-1951  Referred by: Marin Olp, MD Reason for referral : No chief complaint on file.  Completed returned patient assistance forms for Spiriva and placed on provider desk for sign off.  Once completed will fax in to [facility] for approval for year 2023.  Beverly Milch, PharmD Clinical Pharmacist  [place] [phone]   Patient Care Plan: CCM Pharmacy Care Plan     Problem Identified: CAD, HTN, HLD, COPD, Depression and GERD   Priority: High     Long-Range Goal: Disease Management   Start Date: 12/05/2020  Expected End Date: 12/05/2021  This Visit's Progress: On track  Priority: High  Note:   Current Barriers:  Unable to independently afford treatment regimen  Pharmacist Clinical Goal(s):  Patient will verbalize ability to afford treatment regimen through collaboration with PharmD and provider.   Interventions: 1:1 collaboration with Marin Olp, MD regarding development and update of comprehensive plan of care as evidenced by provider attestation and co-signature Inter-disciplinary care team collaboration (see longitudinal plan of care) Comprehensive medication review performed; medication list updated in electronic medical record No medication changes  COPD (Goal: control symptoms and prevent exacerbations) Had an exacerbation treated with prednisone/augmentin through Dr Elease Hashimoto 3/25.   -Not ideally controlled -Current treatment  Symbicort 160-4.5 mcg/act 2 puffs into the lungs two times daily Spiriva 1 capsule into the inhaler and inhale two times daily -Gold Grade: Gold 2 (FEV1 50-79%) -Pulmonary function testing: last 2016 -Exacerbations requiring treatment in last 6 months: two (04/22/2020 and 11/25/2020) -Patient reports consistent use of maintenance inhaler -Counseled on Proper inhaler technique; Benefits of consistent  maintenance inhaler use -Counseled on diet and exercise extensively Assessed patient finances. Has been approved for spiriva patient assistance. has not heard anything on symbicort so will reach out to [employer] for update. - CPA to call.  Depression/Anxiety (Goal: minimize recurring symptoms, ensure medication therapy) Some good days and some bad days in dealing with grief due passing of daughter and fiance. Has successfully transitioned to monotherapy with Pristiq, no longer using sertraline.  Has been walking 1-2 miles nearly every day, also working part time at [employer] now.   -Not ideally controlled -Current treatment: Pristiq 100 mg once daily -Declined counseling at this time -Educated on Benefits of medication for symptom control -Recommended to continue current medication PCP f/u rescheduled for late April  Patient Goals/Self-Care Activities Patient will:  - take medications as prescribed  Medication Assistance: Utilizing Paediatric nurse through [employer].  Patient's preferred pharmacy is:  [facility] - [place], [place] - [address] [place] [address] Phone: [phone] Fax: [phone]  Follow Up:  Patient agrees to Care Plan and Follow-up. Plan: CPA to f/u on Symbicort patient assistance, monthly med synch. [place] telephone 3 months.

## 2021-08-02 DIAGNOSIS — S42201D Unspecified fracture of upper end of right humerus, subsequent encounter for fracture with routine healing: Secondary | ICD-10-CM | POA: Diagnosis not present

## 2021-08-02 DIAGNOSIS — M25611 Stiffness of right shoulder, not elsewhere classified: Secondary | ICD-10-CM | POA: Diagnosis not present

## 2021-08-09 DIAGNOSIS — M25611 Stiffness of right shoulder, not elsewhere classified: Secondary | ICD-10-CM | POA: Diagnosis not present

## 2021-08-09 DIAGNOSIS — S42201D Unspecified fracture of upper end of right humerus, subsequent encounter for fracture with routine healing: Secondary | ICD-10-CM | POA: Diagnosis not present

## 2021-08-12 ENCOUNTER — Other Ambulatory Visit: Payer: Self-pay | Admitting: Physician Assistant

## 2021-08-14 ENCOUNTER — Telehealth: Payer: Self-pay | Admitting: Pharmacist

## 2021-08-14 ENCOUNTER — Other Ambulatory Visit: Payer: Self-pay | Admitting: Family Medicine

## 2021-08-14 NOTE — Chronic Care Management (AMB) (Addendum)
Chronic Care Management Pharmacy Assistant   Name: Katherine Foley  MRN: [id number] DOB: Jan 12, 1951   Reason for Encounter: Medication Coordination Call    Recent office visits:  None  Recent consult visits:  None  Hospital visits:  None since last visit with [facility]  Medications: Outpatient Encounter Medications as of 08/14/2021  Medication Sig   Albuterol Sulfate (PROAIR RESPICLICK) 798 (90 Base) MCG/ACT AEPB Inhale 1 puff into the lungs every 6 (six) hours as needed (wheezing or shortness of breath).   amLODipine (NORVASC) 5 MG tablet Take 1 tablet (5 mg total) by mouth daily.   aspirin EC 81 MG tablet Take 1 tablet (81 mg total) by mouth daily.   buPROPion (WELLBUTRIN XL) 300 MG 24 hr tablet Take 1 tablet (300 mg total) by mouth daily.   Calcium Carb-Cholecalciferol (CALCIUM 500/D PO) Take 2 tablets by mouth daily. Mag and zinc added   cetirizine (ZYRTEC) 10 MG tablet Take 10 mg by mouth at bedtime.   desvenlafaxine (PRISTIQ) 100 MG 24 hr tablet TAKE ONE TABLET BY MOUTH EVERY MORNING   dexlansoprazole (DEXILANT) 60 MG capsule Take 1 capsule (60 mg total) by mouth daily.   ezetimibe (ZETIA) 10 MG tablet Take 1 tablet (10 mg total) by mouth daily.   fluticasone (FLONASE) 50 MCG/ACT nasal spray Place 2 sprays into both nostrils as needed for allergies or rhinitis.   gabapentin (NEURONTIN) 300 MG capsule TAKE ONE CAPSULE BY MOUTH EVERYDAY AT BEDTIME   LORazepam (ATIVAN) 0.5 MG tablet Take ONE tablet by MOUTH twice daily as needed FOR anxiety(do not dive EIGHT hours AFTER using)   Multiple Vitamins-Minerals (MULTIVITAMIN ADULT PO) Take 1 tablet by mouth daily.    nitroGLYCERIN (NITROSTAT) 0.4 MG SL tablet Place 1 tablet (0.4 mg total) under the tongue every 5 (five) minutes as needed for chest pain (if pain does not resolve with 2 doses, call 911).   propranolol (INDERAL) 20 MG tablet Take 1 tablet (20 mg total) by mouth 2 (two) times daily.   rosuvastatin (CRESTOR) 40 MG  tablet Take 1 tablet (40 mg total) by mouth daily.   SYMBICORT 160-4.5 MCG/ACT inhaler Inhale 2 puffs into the lungs 2 (two) times daily.   tiotropium (SPIRIVA HANDIHALER) 18 MCG inhalation capsule Place 18 mcg into inhaler and inhale daily.   traMADol (ULTRAM) 50 MG tablet Take 1 tablet (50 mg total) by mouth every 6 (six) hours as needed.   valACYclovir (VALTREX) 1000 MG tablet Take 1,000 mg by mouth as needed (for fever blisters).   [DISCONTINUED] Aclidinium Bromide (TUDORZA PRESSAIR) 400 MCG/ACT AEPB Inhale 1 puff into the lungs 2 (two) times daily.   No facility-administered encounter medications on file as of 08/14/2021.     BP Readings from Last 3 Encounters:  06/21/21 (!) 110/50  05/28/21 128/79  02/14/21 (!) 106/58    Lab Results  Component Value Date   HGBA1C 6.2 (H) 04/27/2011     Patient obtains medications through Vials  90 Days   Last adherence delivery included:  Gabapentin 300 mg Take one capsule by mouth everyday at bedtime Ezetimibe 10 mg Tab Take one tab by mouth once daily Dexlansoprazole 60 mg Cap Take one capsule once daily Propranolol 20 mg tab Take one tab by mouth twice daily  Amlodipine 5 mg once daily Lorazepam 0.5 mg twice daily as needed Desvenlafaxine 100 mg-Take one tab by mouth once daily Rosuvastatin 40 mg- Take one tab by mouth once daily  Patient is due for  next adherence delivery on: 08/24/2021. Called patient and reviewed medications and coordinated delivery.  This delivery to include: Gabapentin 300 mg Take one capsule by mouth everyday at bedtime Ezetimibe 10 mg Tab Take one tab by mouth once daily Dexlansoprazole 60 mg Cap Take one capsule once daily Desvenlafaxine 100 mg-Take one tab by mouth once daily Amlodipine 5 mg once daily Propranolol 20 mg tab Take one tab by mouth twice daily  Rosuvastatin 40 mg- Take one tab by mouth once daily Bupropion HCL XL 300 mg - once daily Lorazepam 0.5 mg twice daily as needed   Patient needs  refills for: Gabapentin 300 mg Take one capsule by mouth everyday at bedtime Ezetimibe 10 mg Tab Take one tab by mouth once daily Dexlansoprazole 60 mg Cap Take one capsule once daily Amlodipine 5 mg once daily Rosuvastatin 40 mg- Take one tab by mouth once daily Bupropion HCL XL 300 mg - once daily Lorazepam 0.5 mg twice daily as needed  Confirmed delivery date of 08/24/2021, advised patient that pharmacy will contact them the morning of delivery.  Medicare Annual Wellness: Completed Hemoglobin A1C: n/a Colonoscopy: Next due on 07/14/2024 Dexa Scan: Completed Mammogram: Ordered on 05/19/2021  Future Appointments  Date Time Provider [place]  06/01/2022  1:45 PM [facility]     [name], [place] Pharmacist Assistant [phone]   7 minutes spent in review, coordination, and documentation.  Reviewed by: Beverly Milch, PharmD Clinical Pharmacist [phone]

## 2021-08-16 ENCOUNTER — Other Ambulatory Visit: Payer: Self-pay

## 2021-08-16 DIAGNOSIS — S42201D Unspecified fracture of upper end of right humerus, subsequent encounter for fracture with routine healing: Secondary | ICD-10-CM | POA: Diagnosis not present

## 2021-08-16 DIAGNOSIS — M25611 Stiffness of right shoulder, not elsewhere classified: Secondary | ICD-10-CM | POA: Diagnosis not present

## 2021-08-16 MED ORDER — DEXLANSOPRAZOLE 60 MG PO CPDR: 1.0000 | DELAYED_RELEASE_CAPSULE | Freq: Every day | ORAL | 3 refills | Status: DC

## 2021-08-16 MED ORDER — BUPROPION HCL ER (XL) 300 MG PO TB24: 300.0000 mg | ORAL_TABLET | Freq: Every day | ORAL | 5 refills | Status: DC

## 2021-08-16 MED ORDER — AMLODIPINE BESYLATE 5 MG PO TABS: 5.0000 mg | ORAL_TABLET | Freq: Every day | ORAL | 3 refills | Status: DC

## 2021-08-16 MED ORDER — ROSUVASTATIN CALCIUM 40 MG PO TABS: 40.0000 mg | ORAL_TABLET | Freq: Every day | ORAL | 2 refills | Status: DC

## 2021-08-16 MED ORDER — GABAPENTIN 300 MG PO CAPS: ORAL_CAPSULE | ORAL | 3 refills | Status: DC

## 2021-08-17 ENCOUNTER — Other Ambulatory Visit: Payer: Self-pay | Admitting: Family Medicine

## 2021-08-17 NOTE — Telephone Encounter (Signed)
Last filled 05/24/2021 Last OV 03/31/2021  Ok to fill?

## 2021-08-23 DIAGNOSIS — M25511 Pain in right shoulder: Secondary | ICD-10-CM | POA: Diagnosis not present

## 2021-08-31 NOTE — Telephone Encounter (Signed)
LVM for patient to return call to [phone]

## 2021-08-31 NOTE — Telephone Encounter (Signed)
Patient calling again about medication and wanting to speak to [facility]

## 2021-09-05 MED ORDER — SPIRIVA HANDIHALER 18 MCG IN CAPS: 18.0000 ug | ORAL_CAPSULE | Freq: Every day | RESPIRATORY_TRACT | 3 refills | Status: DC

## 2021-09-05 NOTE — Telephone Encounter (Signed)
Medication faxed to pharmacy 

## 2021-09-05 NOTE — Addendum Note (Signed)
Addended by: Clyde Lundborg A on: 09/05/2021 01:48 PM   Modules accepted: Orders

## 2021-09-08 ENCOUNTER — Telehealth: Payer: Self-pay | Admitting: Family Medicine

## 2021-09-08 ENCOUNTER — Telehealth: Payer: Self-pay | Admitting: Pharmacist

## 2021-09-08 ENCOUNTER — Other Ambulatory Visit: Payer: Self-pay | Admitting: Family Medicine

## 2021-09-08 MED ORDER — INCRUSE ELLIPTA 62.5 MCG/ACT IN AEPB: 1.0000 | INHALATION_SPRAY | Freq: Every day | RESPIRATORY_TRACT | 5 refills | Status: DC

## 2021-09-08 NOTE — Telephone Encounter (Signed)
Hey can we call [facility] to check status on this and then call patient to follow up.  I thought I had a rx sent in a few weeks ago.  Let me know what happens with this thanks!

## 2021-09-08 NOTE — Telephone Encounter (Signed)
See my separate phone note-appears Incruse Ellipta is less expensive option perhaps 25% of the cost and this was sent in

## 2021-09-08 NOTE — Progress Notes (Signed)
Patient called and-had issues getting Spiriva through patient assistance.  I attempted to order Spiriva but she was very concerned about cost-appeared that Incruse Ellipta may be 25% of the cost and I sent this in as an alternate

## 2021-09-08 NOTE — Chronic Care Management (AMB) (Signed)
° ° °  Chronic Care Management Pharmacy Assistant   Name: Katherine Foley  MRN: [id number] DOB: 1951/08/01   Reason for Encounter: Patient Assistance Documentation  Several attempts made to contact [name] for an updated status on Spiriva with long hold times.  [name] did receive the patients prescription for Spiriva, however they need the patients most recent proof of income for year 2022.  Patient states she has picked a prescription for Spiriva and is mailing out her income information to [name] today 09/12/2021   [name], [place] Pharmacist Assistant [phone]

## 2021-09-08 NOTE — Telephone Encounter (Signed)
Patient has called.  Very upset. States she received phone call today from April stating she can not complete the financial application for spiriva due to the company not having income statements.    Patient states this has been given to the company.   Very concerned about being out of medication for the weekend.   Is requesting script for spiriva to be sent to [facility] on [address].

## 2021-09-08 NOTE — Telephone Encounter (Signed)
Pt called and said that her tiotropium (SPIRIVA HANDIHALER) 18 MCG inhalation capsule was supposed to be sent in to [facility] phone number:[phone]. She said she is out of the medication and she needs right away. She said it was supposed to be handled a while back by April but it hasnt. She is a little upset and would like a call back at [phone] today so this can be fixed as soon as possible. Please advise

## 2021-09-12 ENCOUNTER — Other Ambulatory Visit: Payer: Self-pay | Admitting: Family Medicine

## 2021-09-12 NOTE — Telephone Encounter (Signed)
Patient picked up Incruse over the weekend.  She is faxing her paperwork for financial assistance directly to [facility] program.  Confident she will be approved for this once paperwork is all in.

## 2021-09-12 NOTE — Telephone Encounter (Signed)
Can we check in with patient and see how she is doing? Doesn't look like this was addressed on Monday

## 2021-09-12 NOTE — Telephone Encounter (Signed)
Last ov: 03/31/2021 Next ov: 06/01/2022 Last refill: 08/17/2021 quantity: 60 refills: 0

## 2021-09-21 ENCOUNTER — Telehealth: Payer: Self-pay

## 2021-09-21 NOTE — Telephone Encounter (Signed)
Patient Name: Katherine Foley Gender: Female DOB: [birth year] Age: 71 Y 3 M 11 D Return Phone Number: [phone] (Primary) Address: City/ State/ Zip: [name]  [address] Client [place] [facility] [place] Client [facility] [place] [facility] [place] [facility] Provider Garret Reddish- MD Contact Type Call Who Is Calling Patient / Member / Family / Caregiver Call Type Triage / Clinical Relationship To Patient Self Return Phone Number [phone] (Primary) Chief Complaint BREATHING - shortness of breath or sounds breathless Reason for Call Symptomatic / Request for [place] states she is having low 02. It is currently at 85 but keeps dropping below 85. Translation No Nurse Assessment Nurse: Raenette Rover, RN, Zella Ball Date/Time Eilene Ghazi Time): 09/21/2021 2:24:28 PM Confirm and document reason for call. If symptomatic, describe symptoms. ---caller state her o2 sat has dropped as low as 85 this morning was 92 last she checked it. Doesn't have monitor with her now at work. Has recently changed inhalers last week. Currently at work and states gets sob when walking around. Does the patient have any new or worsening symptoms? ---Yes Will a triage be completed? ---Yes Related visit to physician within the last 2 weeks? ---Yes Does the PT have any chronic conditions? (i.e. diabetes, asthma, this includes High risk factors for pregnancy, etc.) ---Yes List chronic conditions. ---asthma Is this a behavioral health or substance abuse call? ---No Guidelines Guideline Title Affirmed Question Affirmed Notes Nurse Date/Time (Eastern Time) COVID-19 - Diagnosed or Suspected MODERATE difficulty breathing (e.g., speaks in phrases, SOB even at rest, pulse 100-120) Raenette Rover, RN, Zella Ball 09/21/2021 2:27:33 Disp. Time Eilene Ghazi Time) Disposition Final User 09/21/2021 2:21:28 PM Send to Urgent Queue Jamal Maes 09/21/2021 2:31:43 PM Go  to ED Now Yes Raenette Rover, RN, Herbert Deaner Disagree/Comply Comply Caller Understands Yes PreDisposition InappropriateToAsk Care Advice Given Per Guideline GO TO ED NOW: * Leave now. Drive carefully. ANOTHER ADULT SHOULD DRIVE: * Lips or face turns blue * Severe difficulty breathing occurs * Confusion occurs. CALL EMS 911 IF: CARE ADVICE given per COVID-19 - DIAGNOSED OR SUSPECTED (Adult) guideline. Comments User: Wilson Singer, RN Date/Time Eilene Ghazi Time): 09/21/2021 2:28:59 PM Copd history. User: Wilson Singer, RN Date/Time Eilene Ghazi Time): 09/21/2021 2:36:32 PM Caller states has been sick for a couple of weeks and progressing. Referrals [facility] - ED

## 2021-09-21 NOTE — Telephone Encounter (Signed)
Does not look like pt went to ER, ok for OV here?

## 2021-09-21 NOTE — Telephone Encounter (Signed)
Please call and check on her-if functional's are still low would refer to ER-if they have improved we can try to work her in for a visit but not sure of timing.  I cannot recommend a visit with another provider other than myself if she declines ER and oxygen is low

## 2021-09-21 NOTE — Telephone Encounter (Signed)
Patient has called in stating she has chest congestion and a oxygen level of 92 that keeps dropping to 85.  Patient was requesting OV for tomorrow 1/20.  I have sent to triage.

## 2021-09-22 NOTE — Telephone Encounter (Signed)
Called and spoke with pt and she states she did not go to the ER and her vitals have come up. She will cb if she feels she needs to be seen.

## 2021-10-06 ENCOUNTER — Encounter: Payer: Self-pay | Admitting: Family Medicine

## 2021-10-06 NOTE — Telephone Encounter (Signed)
See attached pictures.

## 2021-10-12 ENCOUNTER — Telehealth: Payer: Self-pay | Admitting: Pharmacist

## 2021-10-12 NOTE — Progress Notes (Signed)
° ° °  Chronic Care Management Pharmacy Assistant   Name: Katherine Foley  MRN: [id number] DOB: 08/14/1951   Reason for Encounter: Patient Assistance Documentation   Received letter from [place] stating:  It has been determined that patient may be eligible for medication through [place]. Patient will need to reach out to Social Security at [phone] to determine their eligibility. If they are denied eligibility through [place], if either you or your patient would send [place] the denieal letter, we will re-consider your patient's enrollment in our Patient Assistance Program.  Patient is aware of this information.  Also during our call patient states she feels as if her inhaler Incruse is not working well for her. She would prefer to go back to Spiriva. - I sent a message to her PCP Dr. Yong Channel.  [name], [place] Pharmacist Assistant [phone]

## 2021-10-13 ENCOUNTER — Telehealth: Payer: Self-pay

## 2021-10-13 ENCOUNTER — Other Ambulatory Visit: Payer: Self-pay

## 2021-10-13 MED ORDER — SPIRIVA HANDIHALER 18 MCG IN CAPS: 18.0000 ug | ORAL_CAPSULE | Freq: Two times a day (BID) | RESPIRATORY_TRACT | 2 refills | Status: DC

## 2021-10-13 NOTE — Telephone Encounter (Signed)
-----   Message from Marin Olp, MD sent at 10/12/2021  7:38 PM EST ----- Regarding: FW: Medication Question You may discontinue her Incruse inhaler and send in prior Spiriva inhaler.  Please convert this to a phone note  Thanks, Garret Reddish ----- Message ----- From: Theador Hawthorne Sent: 10/12/2021   2:08 PM EST To: Marin Olp, MD Subject: Medication Question                            Patient states Incruse inhaler does not work well for her. She prefers to go back to Spiriva. Please advise.   Thank you,  [name], [place] Pharmacist Assistant [phone]

## 2021-10-13 NOTE — Telephone Encounter (Signed)
Spiriva sent in.

## 2021-10-16 ENCOUNTER — Telehealth: Payer: Self-pay | Admitting: Family Medicine

## 2021-10-16 NOTE — Telephone Encounter (Signed)
Christian (our pharmacist) called and made the clarification with [facility].

## 2021-10-16 NOTE — Telephone Encounter (Signed)
Chasity from [facility] needing dosing clarification on a prescription. [phone]  MEDICATION: SPIRIVA HANDIHALER 18 MCG inhalation capsule

## 2021-10-17 NOTE — Telephone Encounter (Signed)
Amy for [facility] called regarding pt's medication. She is needing clarification and would like to speak with CMA.

## 2021-10-17 NOTE — Telephone Encounter (Signed)
Returned Amy call and clarified that the Spiriva should be just once a day.

## 2021-10-23 DIAGNOSIS — B353 Tinea pedis: Secondary | ICD-10-CM | POA: Diagnosis not present

## 2021-10-23 DIAGNOSIS — L408 Other psoriasis: Secondary | ICD-10-CM | POA: Diagnosis not present

## 2021-10-23 DIAGNOSIS — L309 Dermatitis, unspecified: Secondary | ICD-10-CM | POA: Diagnosis not present

## 2021-10-23 DIAGNOSIS — L218 Other seborrheic dermatitis: Secondary | ICD-10-CM | POA: Diagnosis not present

## 2021-10-23 DIAGNOSIS — D485 Neoplasm of uncertain behavior of skin: Secondary | ICD-10-CM | POA: Diagnosis not present

## 2021-10-23 DIAGNOSIS — B354 Tinea corporis: Secondary | ICD-10-CM | POA: Diagnosis not present

## 2021-10-23 DIAGNOSIS — L570 Actinic keratosis: Secondary | ICD-10-CM | POA: Diagnosis not present

## 2021-11-13 ENCOUNTER — Other Ambulatory Visit: Payer: Self-pay

## 2021-11-13 ENCOUNTER — Telehealth: Payer: Self-pay | Admitting: Pharmacist

## 2021-11-13 MED ORDER — GABAPENTIN 300 MG PO CAPS: ORAL_CAPSULE | ORAL | 3 refills | Status: DC

## 2021-11-13 MED ORDER — PROPRANOLOL HCL 20 MG PO TABS: 20.0000 mg | ORAL_TABLET | Freq: Two times a day (BID) | ORAL | 3 refills | Status: DC

## 2021-11-13 MED ORDER — TRAMADOL HCL 50 MG PO TABS: 50.0000 mg | ORAL_TABLET | Freq: Four times a day (QID) | ORAL | 0 refills | Status: DC | PRN

## 2021-11-13 NOTE — Telephone Encounter (Signed)
-----   Message from [name] sent at 11/13/2021  4:07 PM EDT ----- ?Regarding: Rx refill request ?[facility] upcoming [facility]. She will need a 90 DS of the following medications to complete her order: ?Gabapentin 300 mg Take one capsule by mouth everyday at bedtime ?Propranolol 20 mg tab?Take one tab by mouth twice daily? ?Lorazepam 0.5 mg twice daily as needed ? ?-Please send refills to [facility]. ? ?Thank you, ? ?[name], [place] ?Clinical Pharmacist Assistant ?[phone]  ? ?

## 2021-11-13 NOTE — Progress Notes (Signed)
? ? ?Chronic Care Management ?Pharmacy Assistant  ? ?Name: Katherine Foley  MRN: [id number] DOB: Apr 19, 1951 ? ? ?Reason for Encounter: Medication Coordination Call ?  ? ?Recent office visits:  ?None ? ?Recent consult visits:  ?None ? ?Hospital visits:  ?None in previous 6 months ? ?Medications: ?Outpatient Encounter Medications as of 11/13/2021  ?Medication Sig  ? Albuterol Sulfate (PROAIR RESPICLICK) 371 (90 Base) MCG/ACT AEPB Inhale 1 puff into the lungs every 6 (six) hours as needed (wheezing or shortness of breath).  ? amLODipine (NORVASC) 5 MG tablet Take 1 tablet (5 mg total) by mouth daily.  ? aspirin EC 81 MG tablet Take 1 tablet (81 mg total) by mouth daily.  ? buPROPion (WELLBUTRIN XL) 300 MG 24 hr tablet Take 1 tablet (300 mg total) by mouth daily.  ? Calcium Carb-Cholecalciferol (CALCIUM 500/D PO) Take 2 tablets by mouth daily. Mag and zinc added  ? cetirizine (ZYRTEC) 10 MG tablet Take 10 mg by mouth at bedtime.  ? desvenlafaxine (PRISTIQ) 100 MG 24 hr tablet TAKE ONE TABLET BY MOUTH EVERY MORNING  ? dexlansoprazole (DEXILANT) 60 MG capsule Take 1 capsule (60 mg total) by mouth daily.  ? ezetimibe (ZETIA) 10 MG tablet TAKE ONE TABLET BY MOUTH ONCE DAILY  ? fluticasone (FLONASE) 50 MCG/ACT nasal spray Place 2 sprays into both nostrils as needed for allergies or rhinitis.  ? gabapentin (NEURONTIN) 300 MG capsule TAKE ONE CAPSULE BY MOUTH EVERYDAY AT BEDTIME  ? LORazepam (ATIVAN) 0.5 MG tablet TAKE ONE TABLET BY MOUTH twice daily AS NEEDED FOR ANXIETY do not dive 8 hours after using  ? Multiple Vitamins-Minerals (MULTIVITAMIN ADULT PO) Take 1 tablet by mouth daily.   ? nitroGLYCERIN (NITROSTAT) 0.4 MG SL tablet Place 1 tablet (0.4 mg total) under the tongue every 5 (five) minutes as needed for chest pain (if pain does not resolve with 2 doses, call 911).  ? propranolol (INDERAL) 20 MG tablet Take 1 tablet (20 mg total) by mouth 2 (two) times daily.  ? rosuvastatin (CRESTOR) 40 MG tablet Take 1 tablet (40  mg total) by mouth daily.  ? SPIRIVA HANDIHALER 18 MCG inhalation capsule Place 1 capsule (18 mcg total) into inhaler and inhale 2 (two) times daily.  ? SYMBICORT 160-4.5 MCG/ACT inhaler Inhale 2 puffs into the lungs 2 (two) times daily.  ? traMADol (ULTRAM) 50 MG tablet Take 1 tablet (50 mg total) by mouth every 6 (six) hours as needed.  ? valACYclovir (VALTREX) 1000 MG tablet Take 1,000 mg by mouth as needed (for fever blisters).  ? [DISCONTINUED] Aclidinium Bromide (TUDORZA PRESSAIR) 400 MCG/ACT AEPB Inhale 1 puff into the lungs 2 (two) times daily.  ? ?No facility-administered encounter medications on file as of 11/13/2021.  ? ?Reviewed chart for medication changes ahead of medication coordination call. ? ?No OVs, Consults, or hospital visits since last care coordination call/Pharmacist visit. ? ?No medication changes indicated. ? ?BP Readings from Last 3 Encounters:  ?06/21/21 (!) 110/50  ?05/28/21 128/79  ?02/14/21 (!) 106/58  ?  ?Lab Results  ?Component Value Date  ? HGBA1C 6.2 (H) 04/27/2011  ?  ? ?Patient obtains medications through Vials  90 Days  ? ?Last adherence delivery included:  ?Gabapentin 300 mg Take one capsule by mouth everyday at bedtime ?Ezetimibe 10 mg Tab Take one tab by mouth once daily ?Dexlansoprazole 60 mg Cap Take one capsule once daily ?Desvenlafaxine 100 mg-Take one tab by mouth once daily ?Amlodipine 5 mg once daily ?Propranolol 20 mg tab  Take one tab by mouth twice daily  ?Rosuvastatin 40 mg- Take one tab by mouth once daily ?Bupropion HCL XL 300 mg - once daily ?Lorazepam 0.5 mg twice daily as needed ? ?Patient is due for next adherence delivery on: 11/23/2021. ?Called patient and reviewed medications and coordinated delivery. ? ?This delivery to include: ?Gabapentin 300 mg Take one capsule by mouth everyday at bedtime ?Ezetimibe 10 mg Tab Take one tab by mouth once daily ?Dexlansoprazole 60 mg Cap Take one capsule once daily ?Desvenlafaxine 100 mg-Take one tab by mouth once  daily ?Amlodipine 5 mg once daily ?Propranolol 20 mg tab Take one tab by mouth twice daily  ?Rosuvastatin 40 mg- Take one tab by mouth once daily ?Bupropion HCL XL 300 mg - once daily ?Lorazepam 0.5 mg twice daily as needed ?Spiriva Handihaler  ? ?Patient needs refills for: ?Gabapentin 300 mg Take one capsule by mouth everyday at bedtime ?Propranolol 20 mg tab Take one tab by mouth twice daily  ?Lorazepam 0.5 mg twice daily as needed ?-Sent refill request to [facility] [place] ? ?Confirmed delivery date of 11/23/2021, advised patient that pharmacy will contact them the morning of delivery.  ? ?Care Gaps: ?Medicare Annual Wellness: Completed 05/2021 ?Hemoglobin A1C: none available ?Colonoscopy: Next due on 07/14/2024 ?Dexa Scan: Completed ?Mammogram: Ordered on 05/19/2021 ? ?Future Appointments  ?Date Time Provider [place]  ?06/01/2022  1:45 PM [facility] HEALTH COACH [facility]  ? ?[name], [place] ?Clinical Pharmacist Assistant ?[phone]  ?

## 2021-11-20 ENCOUNTER — Other Ambulatory Visit: Payer: Self-pay | Admitting: Family Medicine

## 2021-11-21 ENCOUNTER — Telehealth: Payer: Self-pay | Admitting: Pharmacist

## 2021-11-21 NOTE — Progress Notes (Signed)
PA For Symbicort has been denied. Patient must try and fail Atrovent and Incruse before this medication will be approved. Patient has failed Incruse as she states this inhaler does not work well for her. She has not tried Atrovent. ? ?[name], [place] ?Clinical Pharmacist Assistant ?[phone]  ? ?

## 2021-11-22 ENCOUNTER — Telehealth: Payer: Self-pay

## 2021-11-22 MED ORDER — FLUTICASONE PROPIONATE 50 MCG/ACT NA SUSP: 2.0000 | NASAL | 3 refills | Status: DC | PRN

## 2021-11-22 NOTE — Telephone Encounter (Signed)
-----   Message from [name] sent at 11/22/2021 10:50 AM EDT ----- ?Regarding: Rx refill request ?Patient has an upcoming delivery tomorrow with [facility]. She needs refills sent in for Fluticasone nasal spray please! ? ?Thank you, ? ?[name], [employer] ?Clinical Pharmacist Assistant ?[phone]  ? ?

## 2021-12-13 ENCOUNTER — Telehealth: Payer: Self-pay | Admitting: Pharmacist

## 2021-12-13 NOTE — Progress Notes (Signed)
? ? ?Chronic Care Management ?Pharmacy Assistant  ? ?Name: Katherine Foley  MRN: [id number] DOB: 01/30/1951 ? ?Reason for Encounter: Medication Coordination Call ?  ? ?Recent office visits:  ?None ? ?Recent consult visits:  ?None ? ?Hospital visits:  ?None in previous 6 months ? ?Medications: ?Outpatient Encounter Medications as of 12/13/2021  ?Medication Sig  ? Albuterol Sulfate (PROAIR RESPICLICK) 409 (90 Base) MCG/ACT AEPB Inhale 1 puff into the lungs every 6 (six) hours as needed (wheezing or shortness of breath).  ? amLODipine (NORVASC) 5 MG tablet Take 1 tablet (5 mg total) by mouth daily.  ? aspirin EC 81 MG tablet Take 1 tablet (81 mg total) by mouth daily.  ? buPROPion (WELLBUTRIN XL) 300 MG 24 hr tablet Take 1 tablet (300 mg total) by mouth daily.  ? Calcium Carb-Cholecalciferol (CALCIUM 500/D PO) Take 2 tablets by mouth daily. Mag and zinc added  ? cetirizine (ZYRTEC) 10 MG tablet Take 10 mg by mouth at bedtime.  ? desvenlafaxine (PRISTIQ) 100 MG 24 hr tablet TAKE ONE TABLET BY MOUTH EVERY MORNING  ? dexlansoprazole (DEXILANT) 60 MG capsule Take 1 capsule (60 mg total) by mouth daily.  ? ezetimibe (ZETIA) 10 MG tablet TAKE ONE TABLET BY MOUTH ONCE DAILY  ? fluticasone (FLONASE) 50 MCG/ACT nasal spray Place 2 sprays into both nostrils as needed for allergies or rhinitis.  ? gabapentin (NEURONTIN) 300 MG capsule TAKE ONE CAPSULE BY MOUTH EVERYDAY AT BEDTIME  ? LORazepam (ATIVAN) 0.5 MG tablet TAKE ONE TABLET BY MOUTH twice daily AS NEEDED FOR ANXIETY do not dive 8 hours after using  ? Multiple Vitamins-Minerals (MULTIVITAMIN ADULT PO) Take 1 tablet by mouth daily.   ? nitroGLYCERIN (NITROSTAT) 0.4 MG SL tablet Place 1 tablet (0.4 mg total) under the tongue every 5 (five) minutes as needed for chest pain (if pain does not resolve with 2 doses, call 911).  ? propranolol (INDERAL) 20 MG tablet Take 1 tablet (20 mg total) by mouth 2 (two) times daily.  ? rosuvastatin (CRESTOR) 40 MG tablet Take 1 tablet (40  mg total) by mouth daily.  ? SPIRIVA HANDIHALER 18 MCG inhalation capsule Place 1 capsule (18 mcg total) into inhaler and inhale 2 (two) times daily.  ? SYMBICORT 160-4.5 MCG/ACT inhaler Inhale 2 puffs into the lungs 2 (two) times daily.  ? traMADol (ULTRAM) 50 MG tablet Take 1 tablet (50 mg total) by mouth every 6 (six) hours as needed.  ? valACYclovir (VALTREX) 1000 MG tablet Take 1,000 mg by mouth as needed (for fever blisters).  ? [DISCONTINUED] Aclidinium Bromide (TUDORZA PRESSAIR) 400 MCG/ACT AEPB Inhale 1 puff into the lungs 2 (two) times daily.  ? ?No facility-administered encounter medications on file as of 12/13/2021.  ? ?Reviewed chart for medication changes ahead of medication coordination call. ? ?No OVs, Consults, or hospital visits since last care coordination call/Pharmacist visit. (If appropriate, list visit date, provider name) ? ?No medication changes indicated OR if recent visit, treatment plan here. ? ?BP Readings from Last 3 Encounters:  ?06/21/21 (!) 110/50  ?05/28/21 128/79  ?02/14/21 (!) 106/58  ?  ?Lab Results  ?Component Value Date  ? HGBA1C 6.2 (H) 04/27/2011  ?  ? ?Coordinated acute fill for Lorazepam and Flonase to be delivered 12/22/2021 ? ?Confirmed delivery date of 12/22/2021, advised patient that pharmacy will contact them the morning of delivery. ? ? ?Care Gaps: ?Medicare Annual Wellness: Completed 05/2021 ?Hemoglobin A1C: none available ?Colonoscopy: Next due on 07/14/2024 ?Dexa Scan: Completed ?Mammogram: Ordered  on 05/19/2021 ? ?Future Appointments  ?Date Time Provider [place]  ?06/01/2022  1:45 PM [facility] HEALTH COACH [facility]  ? ?[name], [place] ?Clinical Pharmacist Assistant ?[phone] ?

## 2021-12-14 ENCOUNTER — Telehealth: Payer: Self-pay

## 2021-12-14 MED ORDER — ALBUTEROL SULFATE HFA 108 (90 BASE) MCG/ACT IN AERS: 2.0000 | INHALATION_SPRAY | Freq: Four times a day (QID) | RESPIRATORY_TRACT | 3 refills | Status: AC | PRN

## 2021-12-14 NOTE — Telephone Encounter (Signed)
-----   Message from [name] sent at 12/14/2021 12:07 PM EDT ----- ?Regarding: Medication Question ?Patient's insurance will not cover ProAir Respiclick but will cover generic Albuterol HFA. Patient states albuterol HFA works well for her as well. Can you please send Rx for Al[facility]erol HFA to [facility] for an upcoming delivery. ? ?Thank you, ? ?[name], [place] ?Clinical Pharmacist Assistant ?[phone] ? ? ?

## 2022-01-10 ENCOUNTER — Other Ambulatory Visit: Payer: Self-pay | Admitting: Family Medicine

## 2022-01-10 DIAGNOSIS — L2089 Other atopic dermatitis: Secondary | ICD-10-CM | POA: Diagnosis not present

## 2022-01-10 DIAGNOSIS — D225 Melanocytic nevi of trunk: Secondary | ICD-10-CM | POA: Diagnosis not present

## 2022-01-10 DIAGNOSIS — L570 Actinic keratosis: Secondary | ICD-10-CM | POA: Diagnosis not present

## 2022-01-10 DIAGNOSIS — L218 Other seborrheic dermatitis: Secondary | ICD-10-CM | POA: Diagnosis not present

## 2022-01-10 DIAGNOSIS — L821 Other seborrheic keratosis: Secondary | ICD-10-CM | POA: Diagnosis not present

## 2022-01-10 DIAGNOSIS — D485 Neoplasm of uncertain behavior of skin: Secondary | ICD-10-CM | POA: Diagnosis not present

## 2022-01-10 DIAGNOSIS — L814 Other melanin hyperpigmentation: Secondary | ICD-10-CM | POA: Diagnosis not present

## 2022-02-08 ENCOUNTER — Other Ambulatory Visit: Payer: Self-pay | Admitting: Family Medicine

## 2022-02-12 ENCOUNTER — Telehealth: Payer: Self-pay | Admitting: Pharmacist

## 2022-02-12 NOTE — Progress Notes (Signed)
Chronic Care Management Pharmacy Assistant   Name: Katherine Foley  MRN: [id number] DOB: June 01, 1951   Reason for Encounter: Medication Coordination Call    Recent office visits:  None  Recent consult visits:  None  Hospital visits:  None in previous 6 months  Medications: Outpatient Encounter Medications as of 02/12/2022  Medication Sig   LORazepam (ATIVAN) 0.5 MG tablet TAKE ONE TABLET BY MOUTH twice daily AS NEEDED FOR ANXIETY. Do not dive 8 hours after using.   albuterol (VENTOLIN HFA) 108 (90 Base) MCG/ACT inhaler Inhale 2 puffs into the lungs every 6 (six) hours as needed for wheezing or shortness of breath.   amLODipine (NORVASC) 5 MG tablet Take 1 tablet (5 mg total) by mouth daily.   aspirin EC 81 MG tablet Take 1 tablet (81 mg total) by mouth daily.   buPROPion (WELLBUTRIN XL) 300 MG 24 hr tablet TAKE ONE TABLET BY MOUTH ONCE DAILY   Calcium Carb-Cholecalciferol (CALCIUM 500/D PO) Take 2 tablets by mouth daily. Mag and zinc added   cetirizine (ZYRTEC) 10 MG tablet Take 10 mg by mouth at bedtime.   desvenlafaxine (PRISTIQ) 100 MG 24 hr tablet TAKE ONE TABLET BY MOUTH EVERY MORNING   dexlansoprazole (DEXILANT) 60 MG capsule Take 1 capsule (60 mg total) by mouth daily.   ezetimibe (ZETIA) 10 MG tablet TAKE ONE TABLET BY MOUTH ONCE DAILY   fluticasone (FLONASE) 50 MCG/ACT nasal spray Place 2 sprays into both nostrils as needed for allergies or rhinitis.   gabapentin (NEURONTIN) 300 MG capsule TAKE ONE CAPSULE BY MOUTH EVERYDAY AT BEDTIME   Multiple Vitamins-Minerals (MULTIVITAMIN ADULT PO) Take 1 tablet by mouth daily.    nitroGLYCERIN (NITROSTAT) 0.4 MG SL tablet Place 1 tablet (0.4 mg total) under the tongue every 5 (five) minutes as needed for chest pain (if pain does not resolve with 2 doses, call 911).   propranolol (INDERAL) 20 MG tablet Take 1 tablet (20 mg total) by mouth 2 (two) times daily.   rosuvastatin (CRESTOR) 40 MG tablet Take 1 tablet (40 mg total) by  mouth daily.   SPIRIVA HANDIHALER 18 MCG inhalation capsule Place 1 capsule (18 mcg total) into inhaler and inhale 2 (two) times daily.   SYMBICORT 160-4.5 MCG/ACT inhaler Inhale 2 puffs into the lungs 2 (two) times daily.   traMADol (ULTRAM) 50 MG tablet Take 1 tablet (50 mg total) by mouth every 6 (six) hours as needed.   valACYclovir (VALTREX) 1000 MG tablet Take 1,000 mg by mouth as needed (for fever blisters).   [DISCONTINUED] Aclidinium Bromide (TUDORZA PRESSAIR) 400 MCG/ACT AEPB Inhale 1 puff into the lungs 2 (two) times daily.   No facility-administered encounter medications on file as of 02/12/2022.   Reviewed chart for medication changes ahead of medication coordination call.  No OVs, Consults, or hospital visits since last care coordination call/Pharmacist visit.  No medication changes indicated.  BP Readings from Last 3 Encounters:  06/21/21 (!) 110/50  05/28/21 128/79  02/14/21 (!) 106/58    Lab Results  Component Value Date   HGBA1C 6.2 (H) 04/27/2011     Patient obtains medications through Vials  90 Days   Last adherence delivery included:  Gabapentin 300 mg Take one capsule by mouth everyday at bedtime Ezetimibe 10 mg Tab Take one tab by mouth once daily Dexlansoprazole 60 mg Cap Take one capsule once daily Desvenlafaxine 100 mg-Take one tab by mouth once daily Amlodipine 5 mg once daily Propranolol 20 mg tab Take one  tab by mouth twice daily  Rosuvastatin 40 mg- Take one tab by mouth once daily Bupropion HCL XL 300 mg - once daily Lorazepam 0.5 mg twice daily as needed Spiriva Handihaler   Patient is due for next adherence delivery on: 02/21/2022. Called patient and reviewed medications and coordinated delivery.  This delivery to include: Gabapentin 300 mg Take one capsule by mouth everyday at bedtime Ezetimibe 10 mg Tab Take one tab by mouth once daily Dexlansoprazole 60 mg Cap Take one capsule once daily Desvenlafaxine 100 mg-Take one tab by mouth once  daily Amlodipine 5 mg once daily Propranolol 20 mg tab Take one tab by mouth twice daily  Rosuvastatin 40 mg- Take one tab by mouth once daily Bupropion HCL XL 300 mg - once daily Lorazepam 0.5 mg twice daily as needed  Coordinated acute fill for Spiriva to be delivered 02/12/2022. Patient states she is completely out of this medication and would like it delivered today.   Confirmed delivery date of 02/21/2022, advised patient that pharmacy will contact them the morning of delivery.  Care Gaps: Medicare Annual Wellness: Completed 05/2021 Hemoglobin A1C: 6.2% on 04/27/2011 Colonoscopy: Next due on 07/14/2024 Dexa Scan: Completed Mammogram: Ordered on 05/19/2021  Future Appointments  Date Time Provider [place]  03/19/2022  8:30 AM [facility] PHARMACIST [facility]  06/01/2022  1:45 PM [facility] HEALTH COACH [facility]   [name], [place] Pharmacist Assistant [phone]

## 2022-02-26 DIAGNOSIS — I2510 Atherosclerotic heart disease of native coronary artery without angina pectoris: Secondary | ICD-10-CM | POA: Diagnosis not present

## 2022-02-26 DIAGNOSIS — L409 Psoriasis, unspecified: Secondary | ICD-10-CM | POA: Diagnosis not present

## 2022-02-26 DIAGNOSIS — I10 Essential (primary) hypertension: Secondary | ICD-10-CM | POA: Diagnosis not present

## 2022-02-26 DIAGNOSIS — Z008 Encounter for other general examination: Secondary | ICD-10-CM | POA: Diagnosis not present

## 2022-02-26 DIAGNOSIS — J449 Chronic obstructive pulmonary disease, unspecified: Secondary | ICD-10-CM | POA: Diagnosis not present

## 2022-02-26 DIAGNOSIS — I252 Old myocardial infarction: Secondary | ICD-10-CM | POA: Diagnosis not present

## 2022-02-26 DIAGNOSIS — L309 Dermatitis, unspecified: Secondary | ICD-10-CM | POA: Diagnosis not present

## 2022-02-26 DIAGNOSIS — J309 Allergic rhinitis, unspecified: Secondary | ICD-10-CM | POA: Diagnosis not present

## 2022-02-26 DIAGNOSIS — K219 Gastro-esophageal reflux disease without esophagitis: Secondary | ICD-10-CM | POA: Diagnosis not present

## 2022-02-26 DIAGNOSIS — R69 Illness, unspecified: Secondary | ICD-10-CM | POA: Diagnosis not present

## 2022-02-26 DIAGNOSIS — E785 Hyperlipidemia, unspecified: Secondary | ICD-10-CM | POA: Diagnosis not present

## 2022-02-26 DIAGNOSIS — G2581 Restless legs syndrome: Secondary | ICD-10-CM | POA: Diagnosis not present

## 2022-03-05 ENCOUNTER — Encounter: Payer: Self-pay | Admitting: Family Medicine

## 2022-03-05 ENCOUNTER — Ambulatory Visit (INDEPENDENT_AMBULATORY_CARE_PROVIDER_SITE_OTHER): Payer: Medicare HMO | Admitting: Family Medicine

## 2022-03-05 VITALS — BP 120/82 | HR 60 | Temp 97.8°F | Ht 62.5 in | Wt 126.4 lb

## 2022-03-05 DIAGNOSIS — I10 Essential (primary) hypertension: Secondary | ICD-10-CM | POA: Diagnosis not present

## 2022-03-05 DIAGNOSIS — I700 Atherosclerosis of aorta: Secondary | ICD-10-CM

## 2022-03-05 DIAGNOSIS — F3341 Major depressive disorder, recurrent, in partial remission: Secondary | ICD-10-CM

## 2022-03-05 DIAGNOSIS — J449 Chronic obstructive pulmonary disease, unspecified: Secondary | ICD-10-CM | POA: Diagnosis not present

## 2022-03-05 DIAGNOSIS — D692 Other nonthrombocytopenic purpura: Secondary | ICD-10-CM | POA: Insufficient documentation

## 2022-03-05 DIAGNOSIS — R69 Illness, unspecified: Secondary | ICD-10-CM | POA: Diagnosis not present

## 2022-03-05 DIAGNOSIS — I2510 Atherosclerotic heart disease of native coronary artery without angina pectoris: Secondary | ICD-10-CM

## 2022-03-05 DIAGNOSIS — E785 Hyperlipidemia, unspecified: Secondary | ICD-10-CM | POA: Diagnosis not present

## 2022-03-05 LAB — POC URINALSYSI DIPSTICK (AUTOMATED)
Bilirubin, UA: NEGATIVE
Blood, UA: NEGATIVE
Glucose, UA: NEGATIVE
Ketones, UA: POSITIVE
Nitrite, UA: NEGATIVE
Protein, UA: NEGATIVE
Spec Grav, UA: 1.01 (ref 1.010–1.025)
Urobilinogen, UA: 0.2 E.U./dL
pH, UA: 7 (ref 5.0–8.0)

## 2022-03-05 LAB — LIPID PANEL
Cholesterol: 112 mg/dL (ref 0–200)
HDL: 51.2 mg/dL (ref >39.00)
LDL Cholesterol: 44 mg/dL (ref 0–99)
NonHDL: 61.25
Total CHOL/HDL Ratio: 2
Triglycerides: 86 mg/dL (ref 0.0–149.0)
VLDL: 17.2 mg/dL (ref 0.0–40.0)

## 2022-03-05 LAB — COMPREHENSIVE METABOLIC PANEL
ALT: 32 U/L (ref 0–35)
AST: 35 U/L (ref 0–37)
Albumin: 4.2 g/dL (ref 3.5–5.2)
Alkaline Phosphatase: 48 U/L (ref 39–117)
BUN: 13 mg/dL (ref 6–23)
CO2: 31 mEq/L (ref 19–32)
Calcium: 9.7 mg/dL (ref 8.4–10.5)
Chloride: 99 mEq/L (ref 96–112)
Creatinine, Ser: 0.71 mg/dL (ref 0.40–1.20)
GFR: 84.66 mL/min (ref >60.00)
Glucose, Bld: 97 mg/dL (ref 70–99)
Potassium: 4.4 mEq/L (ref 3.5–5.1)
Sodium: 136 mEq/L (ref 135–145)
Total Bilirubin: 0.4 mg/dL (ref 0.2–1.2)
Total Protein: 6.3 g/dL (ref 6.0–8.3)

## 2022-03-05 LAB — CBC WITH DIFFERENTIAL/PLATELET
Basophils Absolute: 0 10*3/uL (ref 0.0–0.1)
Basophils Relative: 0.4 % (ref 0.0–3.0)
Eosinophils Absolute: 0.1 10*3/uL (ref 0.0–0.7)
Eosinophils Relative: 1.6 % (ref 0.0–5.0)
HCT: 36.1 % (ref 36.0–46.0)
Hemoglobin: 11.9 g/dL — ABNORMAL LOW (ref 12.0–15.0)
Lymphocytes Relative: 21.3 % (ref 12.0–46.0)
Lymphs Abs: 0.8 10*3/uL (ref 0.7–4.0)
MCHC: 33 g/dL (ref 30.0–36.0)
MCV: 95 fl (ref 78.0–100.0)
Monocytes Absolute: 0.5 10*3/uL (ref 0.1–1.0)
Monocytes Relative: 12.3 % — ABNORMAL HIGH (ref 3.0–12.0)
Neutro Abs: 2.5 10*3/uL (ref 1.4–7.7)
Neutrophils Relative %: 64.4 % (ref 43.0–77.0)
Platelets: 161 10*3/uL (ref 150.0–400.0)
RBC: 3.8 Mil/uL — ABNORMAL LOW (ref 3.87–5.11)
RDW: 14.3 % (ref 11.5–15.5)
WBC: 3.9 10*3/uL — ABNORMAL LOW (ref 4.0–10.5)

## 2022-03-05 NOTE — Progress Notes (Signed)
Phone [phone] In person visit   Subjective:   Katherine Foley is a 71 y.o. year old very pleasant female patient who presents for/with See problem oriented charting Chief Complaint  Patient presents with   Follow-up   Depression   COPD   arm bruising    Pt has bilateral arm bruising that she would like you to look at.   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    Priority: High   Depression 09/22/2015    Priority: High   Former smoker 09/22/2015    Priority: High   S/P AVR (aortic valve replacement) 11/06/2013    Priority: High   Aortic stenosis 07/07/2010    Priority: High   CAD (coronary artery disease) 05/17/2010    Priority: High   Chronic obstructive pulmonary disease ([place]) 07/23/2007    Priority: High   Aortic atherosclerosis ([place]) 03/10/2020    Priority: Medium    Restless legs 08/25/2019    Priority: Medium    Hot flashes 03/22/2016    Priority: Medium    Diastolic dysfunction 13/04/6577    Priority: Medium    GERD (gastroesophageal reflux disease) 06/04/2014    Priority: Medium    Carotid stenosis 05/17/2010    Priority: Medium    HLD (hyperlipidemia) 08/29/2009    Priority: Medium    Essential hypertension 04/14/2007    Priority: Medium    Osteopenia 04/14/2007    Priority: Medium    Senile purpura ([place]) 03/05/2022    Priority: Low   Genetic testing 02/10/2018    Priority: Low   Family history of breast cancer     Priority: Low   Allergic rhinitis 09/22/2015    Priority: Low   Adenomatous colon polyp     Priority: Low   Herpes simplex virus (HSV) infection 01/16/2010    Priority: Low    Medications- reviewed and updated Current Outpatient Medications  Medication Sig Dispense Refill   albuterol (VENTOLIN HFA) 108 (90 Base) MCG/ACT inhaler Inhale 2 puffs into the lungs every 6 (six) hours as needed for wheezing or  shortness of breath. 8 g 3   amLODipine (NORVASC) 5 MG tablet Take 1 tablet (5 mg total) by mouth daily. 90 tablet 3   aspirin EC 81 MG tablet Take 1 tablet (81 mg total) by mouth daily.     buPROPion (WELLBUTRIN XL) 300 MG 24 hr tablet TAKE ONE TABLET BY MOUTH ONCE DAILY 30 tablet 5   Calcium Carb-Cholecalciferol (CALCIUM 500/D PO) Take 2 tablets by mouth daily. Mag and zinc added     cetirizine (ZYRTEC) 10 MG tablet Take 10 mg by mouth at bedtime.     desvenlafaxine (PRISTIQ) 100 MG 24 hr tablet TAKE ONE TABLET BY MOUTH EVERY MORNING 90 tablet 2   dexlansoprazole (DEXILANT) 60 MG capsule Take 1 capsule (60 mg total) by mouth daily. 90 capsule 3   ezetimibe (ZETIA) 10 MG tablet TAKE ONE TABLET BY MOUTH ONCE DAILY 90 tablet 3   fluticasone (FLONASE) 50 MCG/ACT nasal spray Place 2 sprays into both nostrils as needed for allergies or rhinitis. 16 g 3   gabapentin (NEURONTIN) 300 MG capsule TAKE ONE CAPSULE BY MOUTH EVERYDAY AT BEDTIME 90 capsule 3   LORazepam (ATIVAN) 0.5 MG tablet TAKE ONE TABLET BY MOUTH twice daily AS NEEDED FOR ANXIETY. Do not dive 8 hours after using. 60 tablet  1   Multiple Vitamins-Minerals (MULTIVITAMIN ADULT PO) Take 1 tablet by mouth daily.      nitroGLYCERIN (NITROSTAT) 0.4 MG SL tablet Place 1 tablet (0.4 mg total) under the tongue every 5 (five) minutes as needed for chest pain (if pain does not resolve with 2 doses, call 911). 30 tablet 5   propranolol (INDERAL) 20 MG tablet Take 1 tablet (20 mg total) by mouth 2 (two) times daily. 180 tablet 3   rosuvastatin (CRESTOR) 40 MG tablet Take 1 tablet (40 mg total) by mouth daily. 90 tablet 2   SPIRIVA HANDIHALER 18 MCG inhalation capsule Place 1 capsule (18 mcg total) into inhaler and inhale 2 (two) times daily. 90 capsule 2   SYMBICORT 160-4.5 MCG/ACT inhaler Inhale 2 puffs into the lungs 2 (two) times daily. 11 g 3   traMADol (ULTRAM) 50 MG tablet Take 1 tablet (50 mg total) by mouth every 6 (six) hours as needed. 15 tablet  0   valACYclovir (VALTREX) 1000 MG tablet Take 1,000 mg by mouth as needed (for fever blisters).     No current facility-administered medications for this visit.     Objective:  BP 120/82   Pulse 60   Temp 97.8 F (36.6 C)   Ht 5' 2.5" (1.588 m)   Wt 126 lb 6.4 oz (57.3 kg)   SpO2 95%   BMI 22.75 kg/m  Gen: NAD, resting comfortably CV: RRR no murmurs rubs or gallops Lungs: CTAB no crackles, wheeze, rhonchi Ext: no edema Skin: warm, dry    Assessment and Plan   #social update- stress with trying to find place to live on social security  #CAD-Status post prior PCI to the RCA and RI and subsequent CABG in 2012.  Cardiac catheterization in 2019 demonstrated patent stents in the RCA and RI, atretic LIMA-LAD and mild nonobstructive disease in the LAD.  #hyperlipidemia with aortic atherosclerosis S: Medication:Aspirin 81 mg, Zetia 10 mg, rosuvastatin 40 mg - no exertional chest pain. stable shortness of breath. Occasional pain when thinking of stressors like living situation but not worsening A/P: CAD-overall stable no exertional pain. [place] SOB Hyperlipidemia-well controleld last year - update lipids today  Aortic atherosclerosis- continue risk factor modification   % Aortic valve disease status post aortic valve replacement-follows with cardiology including echocardiograms and has SBE prophylaxis    #hypertension S: medication: Amlodipine 5 mg, propranolol 10 mg twice daily A/P: Controlled. Continue current medications.   # Depression  # Anxiety S:Medication: Pristiq 100 mg, Wellbutrin 300 mg extended release -still doing hospice grief counseling  -Contributing factors-also fianc of 22 years at age 46 in 2020 and previously lost her best friend/daughter Angie in 2019    03/05/2022    8:26 AM 05/19/2021    1:47 PM 03/31/2021    9:09 AM  Depression screen PHQ 2/9  Decreased Interest 2 0 3  Down, Depressed, Hopeless '3 1 3  '$ PHQ - 2 Score '5 1 6  '$ Altered sleeping 3  0   Tired, decreased energy 3  1  Change in appetite 0  0  Feeling bad or failure about yourself  0  3  Trouble concentrating 0  3  Moving slowly or fidgety/restless 0  1  Suicidal thoughts 0  0  PHQ-9 Score 11  14  Difficult doing work/chores Somewhat difficult  Somewhat difficult  A/P: depression imperfectly controlled. On good rx- she does not want to see psychiatry at this time. Building community would be very helpful. Glad she is  working with hospice grief counseling. Her church may be starting a grief group   #COPD/former smoker S: Medication:Spiriva and Symbicort. Albuterol once a mont -Prior lung cancer screening program but quit smoking in 2007 - stable SOB and cough (but variable) A/P: Controlled. Continue current medications.    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 Stable. Check cbc at least annually  #GERD_ still on dexilant- consider reduction at next visit  #Hot flashes- on gabapentin '300mg'$  at bedtime- also helps with restless legs  Recommended follow up: Return in about 6 months (around 09/05/2022) for physical or sooner if needed.Schedule b4 you leave. Future Appointments  Date Time Provider [place]  03/19/2022  8:30 AM [facility] CCM PHARMACIST [facility]  06/01/2022  1:45 PM [facility] HEALTH COACH [facility]  08/10/2022  3:00 PM Marin Olp, MD [facility]   Lab/Order associations: FASTING   ICD-10-CM   1. Coronary artery disease involving native coronary artery of native heart without angina pectoris  I25.10     2. Chronic obstructive pulmonary disease, unspecified COPD type ([place])  J44.9     3. Recurrent major depressive disorder, in partial remission ([place])  F33.41     4. Hyperlipidemia, unspecified hyperlipidemia type  E78.5 CBC with Differential/Platelet    Comprehensive metabolic panel    Lipid panel    POCT Urinalysis Dipstick  (Automated)    5. Essential hypertension  I10 CBC with Differential/Platelet    Comprehensive metabolic panel    Lipid panel    POCT Urinalysis Dipstick (Automated)    6. Aortic atherosclerosis (H[facility])  I70.0     7. Senile purpura (H[facility])  D69.2       No orders of the defined types were placed in this encounter.   Return precautions advised.  Garret Reddish, MD

## 2022-03-05 NOTE — Patient Instructions (Addendum)
Please stop by lab before you go If you have mychart- we will send your results within 3 business days of [place] receiving them.  If you do not have mychart- we will call you about results within 5 business days of [place] receiving them.  *please also note that you will see labs on mychart as soon as they post. I will later go in and write notes on them- will say "notes from Dr. Yong Channel"   Recommended follow up: Return in about 6 months (around 09/05/2022) for physical or sooner if needed.Schedule b4 you leave.

## 2022-03-07 NOTE — Progress Notes (Signed)
Chronic Care Management Pharmacy Note  SUMMARY: FU visit with PharmD.  Patient has started grief counseling at her church.  She is worried about her housing situation.  Currently living with a friend but that may not be available for long term.  This is causing her stress.  She has good days and bad days.  Recommendations: Continue grief counseling, continue exercise Set up with social worker for housing options  FU 6 months CMA to check in 3 months  03/19/2022 Name:  Katherine Foley MRN:  [id number] DOB:  20-Jan-1951  Subjective: Katherine Foley is an 71 y.o. year old female who is a primary patient of Hunter, Brayton Mars, MD.  The CCM team was consulted for assistance with disease management and care coordination needs.    Engaged with patient by telephone for follow up visit in response to provider referral for pharmacy case management and/or care coordination services.   Consent to Services:  The patient was given information about Chronic Care Management services, agreed to services, and gave verbal consent prior to initiation of services.  Please see initial visit note for detailed documentation.   Patient Care Team: Marin Olp, MD as PCP - General (Family Medicine) Sherren Mocha, MD as PCP - Cardiology (Cardiology) Bensimhon, Shaune Pascal, MD (Cardiology) Tanda Rockers, MD as Consulting Physician (Pulmonary Disease) Jamesetta Geralds, [place] (Chiropractic Medicine) Leta Baptist, MD as Consulting Physician (Otolaryngology) Sharmon Revere as Physician Assistant (Cardiology) Edythe Clarity, [facility] (Pharmacist)  Recent office visits:  None   Recent consult visits:  None   Hospital visits:  None in previous 6 months  Objective:  Lab Results  Component Value Date   CREATININE 0.71 03/05/2022   CREATININE 0.52 03/09/2020   CREATININE 0.62 11/09/2019    Lab Results  Component Value Date   HGBA1C 6.2 (H) 04/27/2011   Last diabetic Eye exam: No results  found for: "HMDIABEYEEXA"  Last diabetic Foot exam: No results found for: "HMDIABFOOTEX"      Component Value Date/Time   CHOL 112 03/05/2022 0912   CHOL 131 12/27/2020 1542   TRIG 86.0 03/05/2022 0912   HDL 51.20 03/05/2022 0912   HDL 68 12/27/2020 1542   CHOLHDL 2 03/05/2022 0912   VLDL 17.2 03/05/2022 0912   LDLCALC 44 03/05/2022 0912   LDLCALC 47 12/27/2020 1542   LDLDIRECT 65.0 06/19/2017 0901       Latest Ref Rng & Units 03/05/2022    9:12 AM 12/27/2020    3:42 PM 09/26/2020    9:25 AM  Hepatic Function  Total Protein 6.0 - 8.3 g/dL 6.3  6.1  6.3   Albumin 3.5 - 5.2 g/dL 4.2  4.3  4.5   AST 0 - 37 U/L 35  33  18   ALT 0 - 35 U/L 32  39  14   Alk Phosphatase 39 - 117 U/L 48  71  65   Total Bilirubin 0.2 - 1.2 mg/dL 0.4  0.3  0.3   Bilirubin, Direct 0.00 - 0.40 mg/dL  <0.10  <0.10     Lab Results  Component Value Date/Time   TSH 0.55 02/01/2017 09:24 AM   TSH 0.384 (L) 12/12/2016 04:15 PM       Latest Ref Rng & Units 03/05/2022    9:12 AM 03/09/2020   11:51 AM 11/09/2019   10:48 AM  CBC  WBC 4.0 - 10.5 K/uL 3.9  5.7  6.0   Hemoglobin 12.0 - 15.0 g/dL 11.9  12.7  12.8   Hematocrit 36.0 - 46.0 % 36.1  38.6  39.1   Platelets 150.0 - 400.0 K/uL 161.0  196.0  193.0     No results found for: "VD25OH"  Clinical ASCVD: Yes  The ASCVD Risk score (Arnett DK, et al., 2019) failed to calculate for the following reasons:   The valid total cholesterol range is 130 to 320 mg/dL    Other: (CHADS2VASc if Afib, PHQ9 if depression, MMRC or CAT for COPD, ACT, DEXA)  Social History   Tobacco Use  Smoking Status Former   Packs/day: 1.50   Years: 44.00   Total pack years: 66.00   Types: Cigarettes   Quit date: 09/03/2005   Years since quitting: 16.5  Smokeless Tobacco Never   BP Readings from Last 3 Encounters:  03/05/22 120/82  06/21/21 (!) 110/50  05/28/21 128/79   Pulse Readings from Last 3 Encounters:  03/05/22 60  06/21/21 62  05/28/21 65   Wt Readings from Last  3 Encounters:  03/05/22 126 lb 6.4 oz (57.3 kg)  06/21/21 120 lb 3.2 oz (54.5 kg)  05/27/21 116 lb (52.6 kg)    Assessment: Review of patient past medical history, allergies, medications, health status, including review of consultants reports, laboratory and other test data, was performed as part of comprehensive evaluation and provision of chronic care management services.   SDOH:  (Social Determinants of Health) assessments and interventions performed: No, assessed within the last year  Financial Resource Strain: Low Risk  (05/19/2021)   Overall Financial Resource Strain (CARDIA)    Difficulty of Paying Living Expenses: Not hard at all   Food Insecurity: No Food Insecurity (05/19/2021)   Hunger Vital Sign    Worried About Running Out of Food in the Last Year: Never true    Ran Out of Food in the Last Year: Never true      CCM Care Plan  Allergies  Allergen Reactions   Codeine Sulfate Itching and Rash   Hydrocodone-Acetaminophen Itching and Rash    Medications Reviewed Today     Reviewed by Edythe Clarity, [facility] (Pharmacist) on 03/19/22 at [place] List Status: <None>   Medication Order Taking? Sig Documenting Provider Last Dose Status Informant    Discontinued 11/09/11 1414   albuterol (VENTOLIN HFA) 108 (90 Base) MCG/ACT inhaler [id number] Yes Inhale 2 puffs into the lungs every 6 (six) hours as needed for wheezing or shortness of breath. Marin Olp, MD Taking Active   amLODipine (NORVASC) 5 MG tablet [id number] Yes Take 1 tablet (5 mg total) by mouth daily. Marin Olp, MD Taking Active   aspirin EC 81 MG tablet [id number] Yes Take 1 tablet (81 mg total) by mouth daily. Richardson Dopp T, PA-C Taking Active   buPROPion (WELLBUTRIN XL) 300 MG 24 hr tablet [id number] Yes TAKE ONE TABLET BY MOUTH ONCE DAILY Marin Olp, MD Taking Active   Calcium Carb-Cholecalciferol (CALCIUM 500/D PO) [id number] Yes Take 2 tablets by mouth daily. Mag and zinc added [provider] Taking Active   cetirizine (ZYRTEC) 10 MG tablet [id number] Yes Take 10 mg by mouth at bedtime. [provider] Taking Active Self  desvenlafaxine (PRISTIQ) 100 MG 24 hr tablet [id number] Yes TAKE ONE TABLET BY MOUTH EVERY MORNING Marin Olp, MD Taking Active   dexlansoprazole (DEXILANT) 60 MG capsule [id number] Yes Take 1 capsule (60 mg total) by mouth daily. Marin Olp, MD Taking Active   ezetimibe (ZETIA) 10 MG tablet  [id number] Yes TAKE ONE TABLET BY MOUTH ONCE DAILY Richardson Dopp T, PA-C Taking Active   fluticasone (FLONASE) 50 MCG/ACT nasal spray [id number] Yes Place 2 sprays into both nostrils as needed for allergies or rhinitis. Marin Olp, MD Taking Active   gabapentin (NEURONTIN) 300 MG capsule [id number] Yes TAKE ONE CAPSULE BY MOUTH EVERYDAY AT BEDTIME Marin Olp, MD Taking Active   LORazepam (ATIVAN) 0.5 MG tablet [id number] Yes TAKE ONE TABLET BY MOUTH twice daily AS NEEDED FOR ANXIETY. Do not dive 8 hours after using. Marin Olp, MD Taking Active   Multiple Vitamins-Minerals (MULTIVITAMIN ADULT PO) [id number] Yes Take 1 tablet by mouth daily.  [provider] Taking Active Self  nitroGLYCERIN (NITROSTAT) 0.4 MG SL tablet [id number] Yes Place 1 tablet (0.4 mg total) under the tongue every 5 (five) minutes as needed for chest pain (if pain does not resolve with 2 doses, call 911). Marin Olp, MD Taking Active   propranolol (INDERAL) 20 MG tablet [id number] Yes Take 1 tablet (20 mg total) by mouth 2 (two) times daily. Marin Olp, MD Taking Active   rosuvastatin (CRESTOR) 40 MG tablet [id number] Yes Take 1 tablet (40 mg total) by mouth daily. Marin Olp, MD Taking Active   [facility] HANDIHALER 18 MCG inhalation capsule [id number] Yes Place 1 capsule (18 mcg total) into inhaler and inhale 2 (two) times daily. Marin Olp, MD Taking Active   SYMBICORT 160-4.5 MCG/ACT inhaler [id number] Yes Inhale 2 puffs into the  lungs 2 (two) times daily. Marin Olp, MD Taking Active   traMADol Veatrice [place]) 50 MG tablet [id number] Yes Take 1 tablet (50 mg total) by mouth every 6 (six) hours as needed. Marin Olp, MD Taking Active   valACYclovir (VALTREX) 1000 MG tablet [id number] Yes Take 1,000 mg by mouth as needed (for fever blisters). 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   Hot flashes 03/22/2016   Allergic rhinitis 09/22/2015   Depression 09/22/2015   Former smoker 09/22/2015   Adenomatous colon polyp    Diastolic dysfunction 93/79/0240   GERD (gastroesophageal reflux disease) 06/04/2014   S/P AVR (aortic valve replacement) 11/06/2013   Aortic stenosis 07/07/2010   CAD (coronary artery disease) 05/17/2010   Carotid stenosis 05/17/2010   Herpes simplex virus (HSV) infection 01/16/2010   HLD (hyperlipidemia) 08/29/2009   Chronic obstructive pulmonary disease ([place]) 07/23/2007   Essential hypertension 04/14/2007   Osteopenia 04/14/2007    Immunization History  Administered Date(s) Administered   Fluad Quad(high Dose 65+) 07/14/2020   Influenza Split 06/19/2011, 07/02/2012   Influenza Whole 08/03/2009   Influenza, High Dose Seasonal PF 05/20/2015, 06/04/2019, 06/04/2019   Influenza,inj,Quad PF,6+ Mos 05/21/2013, 05/14/2014, 06/17/2018   Influenza-Unspecified 08/17/2016, 06/19/2017, 06/17/2018   PFIZER(Purple Top)SARS-COV-2 Vaccination 10/20/2019, 11/09/2019, 06/30/2020   Pneumococcal Conjugate-13 06/14/2014   Pneumococcal Polysaccharide-23 03/22/2016   Td 08/29/2009, 03/17/2020   Zoster Recombinat (Shingrix) 03/10/2018, 07/15/2018   Zoster, Live 01/21/2016    Conditions to be  addressed/monitored: CAD, HTN, HLD, COPD, Depression and GERD  Care Plan : [place]  Updates made by Edythe Clarity, RPH since 03/19/2022 12:00 AM  Problem: CAD, HTN, HLD, COPD, Depression and GERD   Priority: High     Long-Range Goal: Disease Management   Start Date: 12/05/2020  Expected End Date: 12/05/2021  Recent Progress: On track  Priority: High  Note:   Cur Current Barriers:  Possible worsening depression  Pharmacist Clinical Goal(s):  Patient will verbalize ability to afford treatment regimen through collaboration with PharmD and provider.   Interventions: 1:1 collaboration with Marin Olp, MD regarding development and update of comprehensive plan of care as evidenced by provider attestation and co-signature Inter-disciplinary care team collaboration (see longitudinal plan of care) Comprehensive medication review performed; medication list updated in electronic medical record No medication changes  COPD (Goal: control symptoms and prevent exacerbations) 03/19/22 -Not ideally controlled -Current treatment  Symbicort 160-4.5 mcg/act 2 puffs into the lungs two times daily Appropriate, Effective, Safe, Accessible Spiriva 1 capsule into the inhaler and inhale two times daily Appropriate, Effective, Safe, Accessible Albuterol HFA 83mg prn Appropriate, Effective, Safe, Accessible -Gold Grade: Gold 2 (FEV1 50-79%) -Pulmonary function testing: Pulmonary Functions Testing Results:  TLC  Date Value Ref Range Status  03/08/2015 5.24 L Final   -Exacerbations requiring treatment in last 6 months: none -Patient reports consistent use of maintenance inhaler Both inhalers she is now receiving free.  She is using them correctly and rarely uses her albuterol.  Denies any recent wheezing.  Continue to use maintenance inhalers daily.  Continue exercise as current!  Depression/Anxiety (Goal: minimize recurring symptoms, ensure medication therapy)  03/19/22 -Not ideally controlled -Current treatment: Pristiq 100 mg once daily Appropriate, Query effective, ,  Wellbutrin XL 3059mAppropriate, Query effective, ,  -Educated on Benefits of medication for symptom control -Recommended to continue current medication -She has good days and bad days.  She has started going to grief counseling at her church.  She went for the first time and woke up feeling great and then later in the day she became overwhelmed with grief and was crying most of the day.  Encouraged her to continue to attend these sessions.  She is stressed out about her living situation at the moment.  She is living with a friend and cannot afford a place on her own.  Will put her in contact with social worker to assess options.  She does not need any help with food at the moment. Continue to walk outside.  She promised to call me or PCP with any signs of worsening depression or suicidal ideation. No changes to meds at this time.  Patient Goals/Self-Care Activities Patient will:  - take medications as prescribed  Medication Assistance: Utilizing [facility]  [facility] [facility]  [facility] [place] [address] [place]: [phone]: [phone] Up:  Patient agrees to Care Plan and Follow-up. Plan: FU 6 months.     Compliance/Adherence/Medication fill history: Care Gaps: None  Star-Rating Drugs: Rosuvastatin 407m6/15/23 90ds  Future Appointments  Date Time Provider DepGardena/03/2022  2:00 PM [facility] LAB [facility]  06/01/2022  1:45 PM [facility] HEALTH COACH [facility]  08/10/2022  3:00 PM Hunter, SteBrayton MarsD [facility] PECMechanicsvilleharmD Clinical Pharmacist  [phone]

## 2022-03-09 ENCOUNTER — Other Ambulatory Visit: Payer: Self-pay

## 2022-03-09 DIAGNOSIS — D649 Anemia, unspecified: Secondary | ICD-10-CM

## 2022-03-19 ENCOUNTER — Ambulatory Visit: Payer: PPO | Admitting: Pharmacist

## 2022-03-19 ENCOUNTER — Telehealth: Payer: Self-pay

## 2022-03-19 DIAGNOSIS — J449 Chronic obstructive pulmonary disease, unspecified: Secondary | ICD-10-CM

## 2022-03-19 DIAGNOSIS — F3341 Major depressive disorder, recurrent, in partial remission: Secondary | ICD-10-CM

## 2022-03-19 NOTE — Telephone Encounter (Signed)
   Telephone encounter was:  Successful.  03/19/2022 Name: Katherine Foley MRN: [id number] DOB: 1950-11-22  Katherine Foley is a 71 y.o. year old female who is a primary care patient of Yong Channel, Brayton Mars, MD . The community resource team was consulted for assistance with  housing  Care guide performed the following interventions: Patient provided with information about care guide support team and interviewed to confirm resource needs.Patients boyfriend passed away and she is losing her home to his children. She is needing a pet friendly place to live.   Follow Up Plan:  Care guide will follow up with patient by phone over the next two weeks    [facility]  [phone] [address], [place], [place] [address] Phone: [phone] Email: [email or web address]

## 2022-03-19 NOTE — Patient Instructions (Addendum)
Visit Information   Goals Addressed             This Visit's Progress    Track and Manage My Symptoms-Depression       Timeframe:  Long-Range Goal Priority:  High Start Date:  03/19/22                           Expected End Date: 10/20/21                      Follow Up Date 06/19/22    - avoid negative self-talk - exercise at least 2 to 3 times per week - have a plan for how to handle bad days - spend time or talk with others at least 2 to 3 times per week    Why is this important?   Keeping track of your progress will help your treatment team find the right mix of medicine and therapy for you.  Write in your journal every day.  Day-to-day changes in depression symptoms are normal. It may be more helpful to check your progress at the end of each week instead of every day.     Notes:        Patient Care Plan: CCM Pharmacy Care Plan     Problem Identified: CAD, HTN, HLD, COPD, Depression and GERD   Priority: High     Long-Range Goal: Disease Management   Start Date: 12/05/2020  Expected End Date: 12/05/2021  Recent Progress: On track  Priority: High  Note:   Cur Current Barriers:  Possible worsening depression  Pharmacist Clinical Goal(s):  Patient will verbalize ability to afford treatment regimen through collaboration with PharmD and provider.   Interventions: 1:1 collaboration with Marin Olp, MD regarding development and update of comprehensive plan of care as evidenced by provider attestation and co-signature Inter-disciplinary care team collaboration (see longitudinal plan of care) Comprehensive medication review performed; medication list updated in electronic medical record No medication changes  COPD (Goal: control symptoms and prevent exacerbations) 03/19/22 -Not ideally controlled -Current treatment  Symbicort 160-4.5 mcg/act 2 puffs into the lungs two times daily Appropriate, Effective, Safe, Accessible Spiriva 1 capsule into the inhaler and  inhale two times daily Appropriate, Effective, Safe, Accessible Albuterol HFA 47mg prn Appropriate, Effective, Safe, Accessible -Gold Grade: Gold 2 (FEV1 50-79%) -Pulmonary function testing: Pulmonary Functions Testing Results:  TLC  Date Value Ref Range Status  03/08/2015 5.24 L Final   -Exacerbations requiring treatment in last 6 months: none -Patient reports consistent use of maintenance inhaler Both inhalers she is now receiving free.  She is using them correctly and rarely uses her albuterol.  Denies any recent wheezing.  Continue to use maintenance inhalers daily.  Continue exercise as current!  Depression/Anxiety (Goal: minimize recurring symptoms, ensure medication therapy) 03/19/22 -Not ideally controlled -Current treatment: Pristiq 100 mg once daily Appropriate, Query effective, ,  Wellbutrin XL '300mg'$  Appropriate, Query effective, ,  -Educated on Benefits of medication for symptom control -Recommended to continue current medication -She has good days and bad days.  She has started going to grief counseling at her church.  She went for the first time and woke up feeling great and then later in the day she became overwhelmed with grief and was crying most of the day.  Encouraged her to continue to attend these sessions.  She is stressed out about her living situation at the moment.  She is living with a  friend and cannot afford a place on her own.  Will put her in contact with social worker to assess options.  She does not need any help with food at the moment. Continue to walk outside.  She promised to call me or PCP with any signs of worsening depression or suicidal ideation. No changes to meds at this time.  Patient Goals/Self-Care Activities Patient will:  - take medications as prescribed  Medication Assistance: [facility]izing [facility]  Patient's preferred pharmacy is:  [facility] - [place], [place] - [address]  [address] [place] [address] Phone: [phone] Fax: [phone]  Follow Up:  Patient agrees to Care Plan and Follow-up. Plan: FU 6 months.        The patient verbalized understanding of instructions, educational materials, and care plan provided today and DECLINED offer to receive copy of patient instructions, educational materials, and care plan.  Telephone follow up appointment with pharmacy team member scheduled for: 6 months  Edythe Clarity, [place], PharmD Clinical Pharmacist  [facility] [phone]

## 2022-03-19 NOTE — Telephone Encounter (Signed)
   Telephone encounter was:  Unsuccessful.  03/19/2022 Name: Arloa Prak MRN: [id number] DOB: 05-Dec-1950  Unsuccessful outbound call made today to assist with:   housing  Outreach Attempt:  1st Attempt  A HIPAA compliant voice message was left requesting a return call.  Instructed patient to call back at earliest convenience.   [place], Care Management  [phone] [address], [place], [place] [address] Phone: [phone] Email: [email or web address]

## 2022-03-20 ENCOUNTER — Telehealth: Payer: Self-pay

## 2022-03-20 NOTE — Telephone Encounter (Signed)
Mailing letter    [place], Care Management  [phone] [address], [place], [place] [address] Phone: [phone] Email: [email or web address]

## 2022-04-02 ENCOUNTER — Telehealth: Payer: Self-pay

## 2022-04-02 NOTE — Telephone Encounter (Signed)
   Telephone encounter was:  Successful.  04/02/2022 Name: Katherine Foley MRN: [id number] DOB: September 13, 1950  Katherine Foley is a 71 y.o. year old female who is a primary care patient of Yong Channel, Brayton Mars, MD . The community resource team was consulted for assistance with  housing  Care guide performed the following interventions: Patient provided with information about care guide support team and interviewed to confirm resource needs.Follow up for mailed resources, the Patient did receive the resources   Follow Up Plan:  No further follow up planned at this time. The patient has been provided with needed resources.    [place], Care Management  [phone] [address], [place],  [address] Phone: [phone] Email: [email or web address]

## 2022-04-02 NOTE — Progress Notes (Signed)
I usually just refer to CCM social work and they handle the calls.  You can specify what the needs are in the referral and they place it according to the needs.  [id number] is the referral number.  Beverly Milch, PharmD Clinical Pharmacist  [facility] [phone]

## 2022-04-09 ENCOUNTER — Other Ambulatory Visit (INDEPENDENT_AMBULATORY_CARE_PROVIDER_SITE_OTHER): Payer: Medicare HMO

## 2022-04-09 DIAGNOSIS — D649 Anemia, unspecified: Secondary | ICD-10-CM | POA: Diagnosis not present

## 2022-04-09 LAB — CBC WITH DIFFERENTIAL/PLATELET
Basophils Absolute: 0 10*3/uL (ref 0.0–0.1)
Basophils Relative: 0.3 % (ref 0.0–3.0)
Eosinophils Absolute: 0.1 10*3/uL (ref 0.0–0.7)
Eosinophils Relative: 1.2 % (ref 0.0–5.0)
HCT: 37.3 % (ref 36.0–46.0)
Hemoglobin: 12.1 g/dL (ref 12.0–15.0)
Lymphocytes Relative: 16.8 % (ref 12.0–46.0)
Lymphs Abs: 1.2 10*3/uL (ref 0.7–4.0)
MCHC: 32.5 g/dL (ref 30.0–36.0)
MCV: 97.1 fl (ref 78.0–100.0)
Monocytes Absolute: 0.7 10*3/uL (ref 0.1–1.0)
Monocytes Relative: 9.8 % (ref 3.0–12.0)
Neutro Abs: 5.1 10*3/uL (ref 1.4–7.7)
Neutrophils Relative %: 71.9 % (ref 43.0–77.0)
Platelets: 182 10*3/uL (ref 150.0–400.0)
RBC: 3.84 Mil/uL — ABNORMAL LOW (ref 3.87–5.11)
RDW: 14.5 % (ref 11.5–15.5)
WBC: 7 10*3/uL (ref 4.0–10.5)

## 2022-04-10 ENCOUNTER — Other Ambulatory Visit: Payer: Self-pay | Admitting: Family Medicine

## 2022-04-18 ENCOUNTER — Other Ambulatory Visit: Payer: Self-pay | Admitting: *Deleted

## 2022-04-18 NOTE — Patient Outreach (Signed)
  Care Coordination   04/18/2022 Name: Katherine Foley MRN: [id number] DOB: 1950/09/26   Care Coordination Outreach Attempts:  An unsuccessful telephone outreach was attempted today to offer the patient information about available care coordination services as a benefit of their health plan.   Follow Up Plan:  Additional outreach attempts will be made to offer the patient care coordination information and services.   Encounter Outcome:  No Answer  Care Coordination Interventions Activated:  No   Care Coordination Interventions:  No, not indicated    Raina Mina, RN Care Management Coordinator [place] Office [phone]

## 2022-04-26 ENCOUNTER — Other Ambulatory Visit: Payer: Self-pay | Admitting: *Deleted

## 2022-04-26 NOTE — Patient Outreach (Signed)
  Care Coordination   04/26/2022 Name: Katherine Foley MRN: [id number] DOB: 1950-11-13   Care Coordination Outreach Attempts:  A second unsuccessful outreach was attempted today to offer the patient with information about available care coordination services as a benefit of their health plan.     Follow Up Plan:  Additional outreach attempts will be made to offer the patient care coordination information and services.   Encounter Outcome:  No Answer  Care Coordination Interventions Activated:  No   Care Coordination Interventions:  No, not indicated    Raina Mina, RN Care Management Coordinator [place] Office [phone]

## 2022-05-09 ENCOUNTER — Telehealth: Payer: Self-pay

## 2022-05-09 ENCOUNTER — Ambulatory Visit: Payer: Self-pay

## 2022-05-09 NOTE — Patient Instructions (Signed)
Visit Information  Thank you for taking time to visit with me today. Please don't hesitate to contact me if I can be of assistance to you.   Following are the goals we discussed today:   Goals Addressed             This Visit's Progress    COMPLETED: Care Coordination Activities       Care Coordination Interventions: SDoH screening performed - no acute resource challenges identified at this time Determined the patient does not have concerns with medication costs and/or adherence at this time Discussed the patient does have an advance directive - copy requested providing education to the patient on the importance of having a copy readily available in her chart Education on the role of the care coordination team - no follow up desired at this time Instructed the patient to contact her primary care provider as needed        Please call the care guide team at [phone] if you need to schedule an appointment with our care coordination team  If you are experiencing a Mental Health or [place] or need someone to talk to, please call 1-800-273-TALK (toll free, 24 hour hotline)  Patient verbalizes understanding of instructions and care plan provided today and agrees to view in [place]. Active MyChart status and patient understanding of how to access instructions and care plan via MyChart confirmed with patient.     No further follow up required: Please contact your primary care provider as needed  Daneen Schick, BSW, CDP Social Worker, Certified Dementia Practitioner Care Coordination [phone]

## 2022-05-09 NOTE — Patient Outreach (Signed)
  Care Coordination   05/09/2022 Name: Katherine Foley MRN: [id number] DOB: [birth year]   Care Coordination Outreach Attempts:  A third unsuccessful outreach was attempted today to offer the patient with information about available care coordination services as a benefit of their health plan.   Follow Up Plan:  No further outreach attempts will be made at this time. We have been unable to contact the patient to offer or enroll patient in care coordination services  Encounter Outcome:  No Answer  Care Coordination Interventions Activated:  No   Care Coordination Interventions:  No, not indicated    Daneen Schick, BSW, CDP Social Worker, Certified Dementia Practitioner Care Coordination [phone]

## 2022-05-09 NOTE — Patient Outreach (Signed)
  Care Coordination   Initial Visit Note   05/09/2022 Name: Zakara Parkey MRN: [id number] DOB: [birth year]  Elianny Buxbaum is a 71 y.o. year old female who sees Yong Channel, Brayton Mars, MD for primary care. I spoke with  [name] by phone today.  What matters to the patients health and wellness today?  No concerns at this time, doing well    Goals Addressed             This Visit's Progress    COMPLETED: Care Coordination Activities       Care Coordination Interventions: SDoH screening performed - no acute resource challenges identified at this time Determined the patient does not have concerns with medication costs and/or adherence at this time Discussed the patient does have an advance directive - copy requested providing education to the patient on the importance of having a copy readily available in her chart Education on the role of the care coordination team - no follow up desired at this time Instructed the patient to contact her primary care provider as needed        SDOH assessments and interventions completed:  Yes  SDOH Interventions Today    Flowsheet Row Most Recent Value  SDOH Interventions   Food Insecurity Interventions Intervention Not Indicated  Housing Interventions Intervention Not Indicated  Transportation Interventions Intervention Not Indicated  Utilities Interventions Intervention Not Indicated  Financial Strain Interventions Intervention Not Indicated        Care Coordination Interventions Activated:  Yes  Care Coordination Interventions:  Yes, provided   Follow up plan: No further intervention required.   Encounter Outcome:  Pt. Visit Completed   Daneen Schick, BSW, CDP Social Worker, Certified Dementia Practitioner Care Coordination [phone]

## 2022-05-10 ENCOUNTER — Telehealth: Payer: Self-pay | Admitting: Pharmacist

## 2022-05-10 NOTE — Progress Notes (Signed)
Chronic Care Management Pharmacy Assistant   Name: Katherine Foley  MRN: [id number] DOB: [birth year]   Reason for Encounter: Medication Coordination Call    Recent office visits:  None  Recent consult visits:  None  Hospital visits:  None in previous 6 months  Medications: Outpatient Encounter Medications as of 05/10/2022  Medication Sig   albuterol (VENTOLIN HFA) 108 (90 Base) MCG/ACT inhaler Inhale 2 puffs into the lungs every 6 (six) hours as needed for wheezing or shortness of breath.   amLODipine (NORVASC) 5 MG tablet Take 1 tablet (5 mg total) by mouth daily.   aspirin EC 81 MG tablet Take 1 tablet (81 mg total) by mouth daily.   buPROPion (WELLBUTRIN XL) 300 MG 24 hr tablet TAKE ONE TABLET BY MOUTH ONCE DAILY   Calcium Carb-Cholecalciferol (CALCIUM 500/D PO) Take 2 tablets by mouth daily. Mag and zinc added   cetirizine (ZYRTEC) 10 MG tablet Take 10 mg by mouth at bedtime.   desvenlafaxine (PRISTIQ) 100 MG 24 hr tablet TAKE ONE TABLET BY MOUTH EVERY MORNING   dexlansoprazole (DEXILANT) 60 MG capsule Take 1 capsule (60 mg total) by mouth daily.   ezetimibe (ZETIA) 10 MG tablet TAKE ONE TABLET BY MOUTH ONCE DAILY   fluticasone (FLONASE) 50 MCG/ACT nasal spray Place 2 sprays into both nostrils as needed for allergies or rhinitis.   gabapentin (NEURONTIN) 300 MG capsule TAKE ONE CAPSULE BY MOUTH EVERYDAY AT BEDTIME   LORazepam (ATIVAN) 0.5 MG tablet TAKE ONE TABLET BY MOUTH twice daily AS NEEDED FOR ANXIETY. Do not drive 8 hours after using.   Multiple Vitamins-Minerals (MULTIVITAMIN ADULT PO) Take 1 tablet by mouth daily.    nitroGLYCERIN (NITROSTAT) 0.4 MG SL tablet Place 1 tablet (0.4 mg total) under the tongue every 5 (five) minutes as needed for chest pain (if pain does not resolve with 2 doses, call 911).   propranolol (INDERAL) 20 MG tablet Take 1 tablet (20 mg total) by mouth 2 (two) times daily.   rosuvastatin (CRESTOR) 40 MG tablet Take 1 tablet (40 mg total) by  mouth daily.   SPIRIVA HANDIHALER 18 MCG inhalation capsule Place 1 capsule (18 mcg total) into inhaler and inhale 2 (two) times daily.   SYMBICORT 160-4.5 MCG/ACT inhaler Inhale 2 puffs into the lungs 2 (two) times daily.   traMADol (ULTRAM) 50 MG tablet Take 1 tablet (50 mg total) by mouth every 6 (six) hours as needed.   valACYclovir (VALTREX) 1000 MG tablet Take 1,000 mg by mouth as needed (for fever blisters).   [DISCONTINUED] Aclidinium Bromide (TUDORZA PRESSAIR) 400 MCG/ACT AEPB Inhale 1 puff into the lungs 2 (two) times daily.   No facility-administered encounter medications on file as of 05/10/2022.   Reviewed chart for medication changes ahead of medication coordination call.  No OVs, Consults, or hospital visits since last care coordination call/Pharmacist visit.  No medication changes indicated.  BP Readings from Last 3 Encounters:  03/05/22 120/82  06/21/21 (!) 110/50  05/28/21 128/79    Lab Results  Component Value Date   HGBA1C 6.2 (H) 04/27/2011     Patient obtains medications through Vials  90 Days   Last adherence delivery included:  Gabapentin 300 mg Take one capsule by mouth everyday at bedtime Ezetimibe 10 mg Tab Take one tab by mouth once daily Dexlansoprazole 60 mg Cap Take one capsule once daily Desvenlafaxine 100 mg-Take one tab by mouth once daily Amlodipine 5 mg once daily Propranolol 20 mg tab Take one tab  by mouth twice daily  Rosuvastatin 40 mg- Take one tab by mouth once daily Bupropion HCL XL 300 mg - once daily Lorazepam 0.5 mg twice daily as needed  Patient is due for next adherence delivery on: 05/22/2022. Called patient and reviewed medications and coordinated delivery.  This delivery to include: Gabapentin 300 mg Take one capsule by mouth everyday at bedtime Ezetimibe 10 mg Tab Take one tab by mouth once daily Dexlansoprazole 60 mg Cap Take one capsule once daily Desvenlafaxine 100 mg-Take one tab by mouth once daily Amlodipine 5 mg once  daily Propranolol 20 mg tab Take one tab by mouth twice daily  Rosuvastatin 40 mg- Take one tab by mouth once daily Bupropion HCL XL 300 mg - once daily Lorazepam 0.5 mg twice daily as needed  Patient needs refills for Rosuvastatin 40 mg once a day  Confirmed delivery date of 05/22/2022, advised patient that pharmacy will contact them the morning of delivery.  Care Gaps: Medicare Annual Wellness: Completed 05/2021 Hemoglobin A1C: 6.2% on 04/27/2011 Colonoscopy: Next due on 07/14/2024 Dexa Scan: Completed Mammogram: Ordered on 05/19/2021  Future Appointments  Date Time Provider [place]  06/01/2022  1:45 PM [facility] HEALTH COACH [facility]  08/10/2022  3:00 PM Marin Olp, MD [facility]  09/24/2022  2:00 PM [facility] PHARMACIST [facility]   [name], [place] Pharmacist Assistant [phone]

## 2022-05-11 ENCOUNTER — Telehealth: Payer: Self-pay

## 2022-05-11 MED ORDER — ROSUVASTATIN CALCIUM 40 MG PO TABS: 40.0000 mg | ORAL_TABLET | Freq: Every day | ORAL | 2 refills | Status: DC

## 2022-05-11 NOTE — Telephone Encounter (Signed)
-----   Message from [name] sent at 05/10/2022  4:41 PM EDT ----- Regarding: Rx refill request Patient has an upcoming delivery with [facility]. She will need refills on Rosuvastatin to complete her order. Please send refills to [facility].  Thank you,  [name], [place] Pharmacist Assistant [phone]

## 2022-05-17 ENCOUNTER — Encounter (HOSPITAL_COMMUNITY): Payer: Self-pay | Admitting: Physician Assistant

## 2022-05-24 ENCOUNTER — Telehealth (HOSPITAL_COMMUNITY): Payer: Self-pay | Admitting: Physician Assistant

## 2022-05-24 NOTE — Telephone Encounter (Signed)
Just an FYI. We have made several attempts to contact this patient including sending a letter to schedule or reschedule their echocardiogram. We will be removing the patient from the echo [place].   05/17/22 MAILED LETTER [facility]  05/15/22 [facility] to schedule @ 11:20/[facility]  05/08/22 [facility] to schedule @ 2:10/[facility]  04/30/22 [facility] to schedule @ 3:08/[facility]    Thank you

## 2022-05-25 NOTE — Telephone Encounter (Signed)
[place], [place]    05/25/2022 11:51 AM

## 2022-05-28 ENCOUNTER — Encounter: Payer: Self-pay | Admitting: *Deleted

## 2022-06-01 ENCOUNTER — Ambulatory Visit: Payer: PPO

## 2022-06-07 ENCOUNTER — Ambulatory Visit (INDEPENDENT_AMBULATORY_CARE_PROVIDER_SITE_OTHER): Payer: PPO

## 2022-06-07 DIAGNOSIS — Z0000 Encounter for general adult medical examination without abnormal findings: Secondary | ICD-10-CM

## 2022-06-07 NOTE — Progress Notes (Signed)
Virtual Visit via Telephone Note  I connected with  Katherine Foley on 06/07/22 at  8:30 AM EDT by telephone and verified that I am speaking with the correct person using two identifiers.  Medicare Annual Wellness visit completed telephonically due to Covid-19 pandemic.   Persons participating in this call: This Health Coach and this patient.   Location: Patient: home  Provider: office    I discussed the limitations, risks, security and privacy concerns of performing an evaluation and management service by telephone and the availability of in person appointments. The patient expressed understanding and agreed to proceed.  Unable to perform video visit due to video visit attempted and failed and/or patient does not have video capability.   Some vital signs may be absent or patient reported.   Katherine Brace, LPN   Subjective:   Katherine Foley is a 71 y.o. female who presents for Medicare Annual (Subsequent) preventive examination.  Review of Systems     Cardiac Risk Factors include: advanced age (>84mn, >>73women);hypertension;dyslipidemia     Objective:    There were no vitals filed for this visit. There is no height or weight on file to calculate BMI.     06/07/2022    8:25 AM 05/09/2022   11:53 AM 05/27/2021   11:29 PM 05/19/2021    1:49 PM 02/14/2021    8:42 AM 02/06/2021   11:30 AM 04/21/2020    9:47 AM  Advanced Directives  Does Patient Have a Medical Advance Directive? Yes Yes No Yes No No Yes  Type of [place] will Healthcare Power of [place]sof [place]renaLiving will  Does patient want to make changes to medical advance directive?  No - Patient declined       Copy of HLaduein Chart? No - copy requested No - copy requested  No - copy requested   No - copy requested  Would patient like information on creating a medical advance directive?     No  - Patient declined No - Patient declined     Current Medications (verified) Outpatient Encounter Medications as of 06/07/2022  Medication Sig   albuterol (VENTOLIN HFA) 108 (90 Base) MCG/ACT inhaler Inhale 2 puffs into the lungs every 6 (six) hours as needed for wheezing or shortness of breath.   amLODipine (NORVASC) 5 MG tablet Take 1 tablet (5 mg total) by mouth daily.   aspirin EC 81 MG tablet Take 1 tablet (81 mg total) by mouth daily.   buPROPion (WELLBUTRIN XL) 300 MG 24 hr tablet TAKE ONE TABLET BY MOUTH ONCE DAILY   Calcium Carb-Cholecalciferol (CALCIUM 500/D PO) Take 2 tablets by mouth daily. Mag and zinc added   cetirizine (ZYRTEC) 10 MG tablet Take 10 mg by mouth at bedtime.   desvenlafaxine (PRISTIQ) 100 MG 24 hr tablet TAKE ONE TABLET BY MOUTH EVERY MORNING   dexlansoprazole (DEXILANT) 60 MG capsule Take 1 capsule (60 mg total) by mouth daily.   ezetimibe (ZETIA) 10 MG tablet TAKE ONE TABLET BY MOUTH ONCE DAILY   fluticasone (FLONASE) 50 MCG/ACT nasal spray Place 2 sprays into both nostrils as needed for allergies or rhinitis.   gabapentin (NEURONTIN) 300 MG capsule TAKE ONE CAPSULE BY MOUTH EVERYDAY AT BEDTIME   LORazepam (ATIVAN) 0.5 MG tablet TAKE ONE TABLET BY MOUTH twice daily AS NEEDED FOR ANXIETY. Do not drive 8 hours after using.   Multiple Vitamins-Minerals (MULTIVITAMIN  ADULT PO) Take 1 tablet by mouth daily.    nitroGLYCERIN (NITROSTAT) 0.4 MG SL tablet Place 1 tablet (0.4 mg total) under the tongue every 5 (five) minutes as needed for chest pain (if pain does not resolve with 2 doses, call 911).   propranolol (INDERAL) 20 MG tablet Take 1 tablet (20 mg total) by mouth 2 (two) times daily.   rosuvastatin (CRESTOR) 40 MG tablet Take 1 tablet (40 mg total) by mouth daily.   SPIRIVA HANDIHALER 18 MCG inhalation capsule Place 1 capsule (18 mcg total) into inhaler and inhale 2 (two) times daily.   SYMBICORT 160-4.5 MCG/ACT inhaler Inhale 2 puffs into the lungs 2 (two) times  daily.   valACYclovir (VALTREX) 1000 MG tablet Take 1,000 mg by mouth as needed (for fever blisters).   [DISCONTINUED] Aclidinium Bromide (TUDORZA PRESSAIR) 400 MCG/ACT AEPB Inhale 1 puff into the lungs 2 (two) times daily.   [DISCONTINUED] traMADol (ULTRAM) 50 MG tablet Take 1 tablet (50 mg total) by mouth every 6 (six) hours as needed.   No facility-administered encounter medications on file as of 06/07/2022.    Allergies (verified) Codeine sulfate and Hydrocodone-acetaminophen   History: Past Medical History:  Diagnosis Date   Adenomatous colon polyp    Anxiety    Anxiety/Depression 02/15/2009   Aortic stenosis s/p bioprosthetic AVR 07/07/2010   Echo 9/19:  Mild conc LVH, EF 60-65, no RWMA, Gr 1 DD, AVR ok (mean 10, peak 21), trivial MR, normal RVSF, mild TR // Echo 10/22: EF 60-65, no RWMA, normal RVSF, AVR with increased gradients (mean 24 increased from 10 in 2019)   Barrett's esophagus    04/2007; 2000   Carotid stenosis 05/17/2010   a. dopplers 97/94: RICA 80-16%; LICA 5-53% (follow up due 11/12)   COPD 07/23/2007   Coronary Artery Disease 05/17/2010   NSTEMI 8/11 - BMS to RCA;  cath 4/12: dLAD 50%, RI 80% (PCI), AVCFX 30%, pRCA 30%, stent ok, 40-50, then 50% dist RCA;  c. s/p Promus DES to RI 12/25/10;  d. 04/2011 CABG x 1 (LIMA->LAD) @ time of AVR // Cath in 9/19: RI and RCA stents ok; L-LAD atretic, mild disease in mLAD >> med Rx   Family history of breast cancer    2 daughters; PALB2 mutation in family   Genetic testing 02/10/2018   PALB2 analysis at [place] - Negative for familial mutation   GERD    H/O hiatal hernia    Hemorrhoid thrombosis    "had it lanced" (03/25/2013)   Herpes simplex without mention of complication 74/82/7078   History of blood transfusion    'w/OHS" (03/25/2013)   Hyperlipidemia 08/29/2009   Hypertension 04/14/2007   Migraine HAs 04/20/2008   Myocardial infarction [facility]) 2011   "during catherization" (03/25/2013)   OSTEOPENIA 04/14/2007    Ovarian cyst    Parotid mass    left   Pneumonia    "multiple times" (03/25/2013)   Urine, incontinence, stress female    Past Surgical History:  Procedure Laterality Date   AORTIC VALVE REPLACEMENT  2012   APPENDECTOMY     BLEPHAROPLASTY Bilateral ~ Boley N/A 07/06/2016   Procedure: Left Heart Cath and Cors/Grafts Angiography;  Surgeon: Sherren Mocha, MD;  Location: [place] CV [facility];  Service: Cardiovascular;  Laterality: N/A;   CHOLECYSTECTOMY N/A 03/25/2013   Procedure: LAPAROSCOPIC CHOLECYSTECTOMY WITH INTRAOPERATIVE CHOLANGIOGRAM;  Surgeon: Imogene Burn. Georgette Dover, MD;  Location: [place];  Service: General;  Laterality: N/A;   CORONARY ANGIOGRAPHY  N/A 05/26/2018   Procedure: CORONARY ANGIOGRAPHY (CATH LAB);  Surgeon: Jettie Booze, MD;  Location: [place] [facility];  Service: Cardiovascular;  Laterality: N/A;   CORONARY ANGIOPLASTY WITH STENT PLACEMENT  2010   "I have 2 stents" (03/25/2013)   [place]; 1998   "cust in maxillary sinus; put a stent in then removed it" (03/25/2013)   ORIF ANKLE FRACTURE Right 02/14/2021   Procedure: OPEN TREATMENT OF RIGHT TRIMALLEOLAR ANKLE FRACTURE WITHOUT POSTERIOR FIXATION, SYNDESMOSIS;  Surgeon: Erle Crocker, MD;  Location: [place];  Service: Orthopedics;  Laterality: Right;   OVARIAN CYST REMOVAL Right 2011   "size of a soccer ball" (03/25/2013)   PAROTIDECTOMY Left 04/28/2019   Procedure: LEFT TOTAL PAROTIDECTOMY;  Surgeon: Leta Baptist, MD;  Location: [place];  Service: ENT;  Laterality: Left;   thrombosed hemorrohid lanced     TONSILLECTOMY AND ADENOIDECTOMY     TOTAL ABDOMINAL HYSTERECTOMY     Family History  Problem Relation Age of Onset   Coronary artery disease Other    Heart disease Other        6 stents   Heart disease Mother    COPD Father        smoked   Heart disease Father    Rheum arthritis Father    Heart disease Maternal Uncle    Breast cancer  Daughter        currently 62   Asthma Son        as a child    Breast cancer Daughter 28       currently 64; PALB2 mutation   Cancer Other        very distant paternal cousin; pancreatic ca   Cirrhosis Sister    Stroke Brother    Colon cancer Neg Hx    Colon polyps Neg Hx    Social History   Socioeconomic History   Marital status: Single    Spouse name: Not on file   Number of children: 3   Years of education: Not on file   Highest education level: Not on file  Occupational History   Occupation: Glass blower/designer: [employer]   Occupation: retired  Tobacco Use   Smoking status: Former    Packs/day: 1.50    Years: 44.00    Total pack years: 66.00    Types: Cigarettes    Quit date: 09/03/2005    Years since quitting: 16.7   Smokeless tobacco: Never  Vaping Use   Vaping Use: Never used  Substance and Sexual Activity   Alcohol use: Not Currently    Alcohol/week: 0.0 standard drinks of alcohol    Comment: social   Drug use: No   Sexual activity: Not Currently    Birth control/protection: Post-menopausal, Surgical  Other Topics Concern   Not on file  Social History Narrative   Lost fiancee of 22 years in 2022. 3 children (2 living). 6 grandkids. 2 greatgrandkids. Keeps greatgrandson on tuesdays- loves her so much.       Retired from [employer] (parttime- Dec 12)   Faith is very important to her      Hobbies: reading, cross stich, sew. Wants to learn to knit.       Social Determinants of Health   Financial Resource Strain: Low Risk  (06/07/2022)   Overall Financial Resource Strain (CARDIA)    Difficulty of Paying Living Expenses: Not very hard  Food Insecurity: No Food Insecurity (06/07/2022)  Hunger Vital Sign    Worried About Running Out of Food in the Last Year: Never true    Ran Out of Food in the Last Year: Never true  Transportation Needs: No Transportation Needs (06/07/2022)   PRAPARE - Hydrologist (Medical): No    Lack of  Transportation (Non-Medical): No  Physical Activity: Sufficiently Active (06/07/2022)   Exercise Vital Sign    Days of Exercise per Week: 5 days    Minutes of Exercise per Session: 30 min  Stress: Stress Concern Present (06/07/2022)   [place]    Feeling of Stress : To some extent  Social Connections: Moderately Isolated (06/07/2022)   Social Connection and Isolation Panel [NHANES]    Frequency of Communication with Friends and Family: Once a week    Frequency of Social Gatherings with Friends and Family: More than three times a week    Attends Religious Services: More than 4 times per year    Active Member of [employer] or Organizations: No    Attends Music therapist: Never    Marital Status: Never married    Tobacco Counseling Counseling given: Not Answered   Clinical Intake:  Pre-visit preparation completed: Yes  Pain : No/denies pain     Nutritional Risks: None Diabetes: No  How often do you need to have someone help you when you read instructions, pamphlets, or other written materials from your doctor or pharmacy?: 1 - Never  Diabetic?no  Interpreter Needed?: No  Information entered by :: Charlott Rakes, LPN   Activities of Daily Living    06/07/2022    8:27 AM  In your present state of health, do you have any difficulty performing the following activities:  Hearing? 0  Vision? 0  Difficulty concentrating or making decisions? 0  Walking or climbing stairs? 0  Dressing or bathing? 0  Doing errands, shopping? 0  Preparing Food and eating ? N  Using the Toilet? N  In the past six months, have you accidently leaked urine? Y  Comment at times  Do you have problems with loss of bowel control? Y  Comment at times  Managing your Medications? N  Managing your Finances? N  Housekeeping or managing your Housekeeping? N    Patient Care Team: Marin Olp, MD as PCP - General (Family  Medicine) Sherren Mocha, MD as PCP - Cardiology (Cardiology) Bensimhon, Shaune Pascal, MD (Cardiology) Tanda Rockers, MD as Consulting Physician (Pulmonary Disease) Jamesetta Geralds, [place] (Chiropractic Medicine) Leta Baptist, MD as Consulting Physician (Otolaryngology) Sharmon Revere as Physician Assistant (Cardiology) Edythe Clarity, [facility] (Pharmacist) Daneen Schick as Talent any recent Medical Services you may have received from other than [facility] providers in the past year (date may be approximate).     Assessment:   This is a routine wellness examination for [place].  Hearing/Vision screen Hearing Screening - Comments:: Pt denies any hearing issues  Vision Screening - Comments:: Pt follows up with Dr Phineas Douglas for annul eye exams   Dietary issues and exercise activities discussed: Current Exercise Habits: Home exercise routine, Type of exercise: walking, Time (Minutes): 30, Frequency (Times/Week): 7, Weekly Exercise (Minutes/Week): 210   Goals Addressed             This Visit's Progress    Patient Stated       Maintain health        Depression Screen  06/07/2022    8:24 AM 03/05/2022    8:26 AM 05/19/2021    1:47 PM 03/31/2021    9:09 AM 01/20/2021   10:08 AM 12/23/2020   10:08 AM 11/11/2020   11:49 AM  PHQ 2/9 Scores  PHQ - 2 Score 5 5 1 6 4 5 5   PHQ- 9 Score 11 11  14 14 21 21     Fall Risk    06/07/2022    8:26 AM 05/19/2021    1:50 PM 10/28/2020    3:52 PM 04/21/2020    9:48 AM 04/16/2019    4:04 PM  Fall Risk   Falls in the past year? 1 1 0 0 1  Number falls in past yr: 1 1 0 1 1  Injury with Fall? 0 1 0 0 0  Comment  ankle broken     Risk for fall due to : Impaired vision;Impaired balance/gait Impaired vision  History of fall(s);Impaired vision   Follow up Falls prevention discussed Falls prevention discussed   Education provided    FALL RISK PREVENTION PERTAINING TO THE HOME:  Any stairs in or around the home? No   If so, are there any without handrails? No  Home free of loose throw rugs in walkways, pet beds, electrical cords, etc? Yes  Adequate lighting in your home to reduce risk of falls? Yes   ASSISTIVE DEVICES UTILIZED TO PREVENT FALLS:  Life alert? No  Use of a cane, walker or w/c? No  Grab bars in the bathroom? Yes  Shower chair or bench in shower? Yes  Elevated toilet seat or a handicapped toilet? No   TIMED UP AND GO:  Was the test performed? No .   Cognitive Function:        06/07/2022    8:28 AM 05/19/2021    1:51 PM 04/21/2020    9:50 AM 04/04/2018    1:29 PM  6CIT Screen  What Year? 0 points 0 points 0 points 0 points  What month? 0 points 0 points 0 points 0 points  What time? 0 points 0 points  0 points  Count back from 20 0 points 0 points 0 points 0 points  Months in reverse 0 points 0 points 0 points 0 points  Repeat phrase 0 points 0 points 0 points   Total Score 0 points 0 points      Immunizations Immunization History  Administered Date(s) Administered   Fluad Quad(high Dose 65+) 07/14/2020   Influenza Split 06/19/2011, 07/02/2012   Influenza Whole 08/03/2009   Influenza, High Dose Seasonal PF 05/20/2015, 06/04/2019, 06/04/2019   Influenza,inj,Quad PF,6+ Mos 05/21/2013, 05/14/2014, 06/17/2018   Influenza-Unspecified 08/17/2016, 06/19/2017, 06/17/2018   PFIZER(Purple Top)SARS-COV-2 Vaccination 10/20/2019, 11/09/2019, 06/30/2020   Pneumococcal Conjugate-13 06/14/2014   Pneumococcal Polysaccharide-23 03/22/2016   Td 08/29/2009, 03/17/2020   Zoster Recombinat (Shingrix) 03/10/2018, 07/15/2018   Zoster, Live 01/21/2016    TDAP status: Up to date  Flu Vaccine status: Due, Education has been provided regarding the importance of this vaccine. Advised may receive this vaccine at local pharmacy or Health Dept. Aware to provide a copy of the vaccination record if obtained from local pharmacy or Health Dept. Verbalized acceptance and understanding.  Pneumococcal  vaccine status: Up to date  Covid-19 vaccine status: Completed vaccines  Qualifies for Shingles Vaccine? Yes   Zostavax completed Yes   Shingrix Completed?: Yes  Screening Tests Health Maintenance  Topic Date Due   COVID-19 Vaccine (4 - Pfizer risk series) 08/25/2020   INFLUENZA  VACCINE  04/03/2022   MAMMOGRAM  06/16/2022   COLONOSCOPY (Pts 45-58yr Insurance coverage will need to be confirmed)  07/14/2024   Pneumonia Vaccine 71 Years old  Completed   DEXA SCAN  Completed   Hepatitis C Screening  Completed   Zoster Vaccines- Shingrix  Completed   HPV VACCINES  Aged Out   TETANUS/TDAP  Discontinued    Health Maintenance  Health Maintenance Due  Topic Date Due   COVID-19 Vaccine (4 - Pfizer risk series) 08/25/2020   INFLUENZA VACCINE  04/03/2022    Colorectal cancer screening: Type of screening: Colonoscopy. Completed 07/14/14. Repeat every 10 years  Mammogram status: Completed 06/16/20. Repeat every year  Bone Density status: Completed 03/09/20. Results reflect: Bone density results: OSTEOPENIA. Repeat every 2 years.   Additional Screening:  Hepatitis C Screening: Completed 12/21/15  Vision Screening: Recommended annual ophthalmology exams for early detection of glaucoma and other disorders of the eye. Is the patient up to date with their annual eye exam?  Yes  Who is the provider or what is the name of the office in which the patient attends annual eye exams? Dr PPhineas Douglas If pt is not established with a provider, would they like to be referred to a provider to establish care? No .   Dental Screening: Recommended annual dental exams for proper oral hygiene  Community Resource Referral / Chronic Care Management: CRR required this visit?  No   CCM required this visit?  No      Plan:     I have personally reviewed and noted the following in the patient's chart:   Medical and social history Use of alcohol, tobacco or illicit drugs  Current medications and  supplements including opioid prescriptions. Patient is not currently taking opioid prescriptions. Functional ability and status Nutritional status Physical activity Advanced directives List of other physicians Hospitalizations, surgeries, and ER visits in previous 12 months Vitals Screenings to include cognitive, depression, and falls Referrals and appointments  In addition, I have reviewed and discussed with patient certain preventive protocols, quality metrics, and best practice recommendations. A written personalized care plan for preventive services as well as general preventive health recommendations were provided to patient.     TWillette Brace LPN   [id number]  Nurse Notes: none

## 2022-06-07 NOTE — Patient Instructions (Signed)
Katherine Foley , Thank you for taking time to come for your Medicare Wellness Visit. I appreciate your ongoing commitment to your health goals. Please review the following plan we discussed and let me know if I can assist you in the future.   These are the goals we discussed:  Goals       Patient Stated (pt-stated)      Find something to focus on during grieving period that provided joy       Patient Stated      Stay health and  keep walking      Patient Stated      None at this time      Patient Stated      Maintain health       PharmD f/u telephone visit      Katherine Foley (see longitudinal plan of care for additional care plan information)  Current Barriers:  Chronic Disease Management support, education, and care coordination needs related to Hypertension, Hyperlipidemia, Diabetes, and COPD   Hypertension BP Readings from Last 3 Encounters:  06/24/20 122/74  03/09/20 118/62  11/09/19 120/72  Pharmacist Clinical Goal(s): Over the next 365 days, patient will work with PharmD and providers to maintain BP goal <130/80 Current regimen:  Amlodipine 5 mg once daily Propanolol 20 mg twice daily  Interventions: We discussed diet/exercise - Maintain a healthy weight and exercise regularly, as directed by your health care provider. Eat healthy foods, such as: Lean proteins, complex carbohydrates, fresh fruits and vegetables, low-fat dairy products, healthy fats. Patient self care activities - Over the next 365 days, patient will: Check BP at least once every 1-2 weeks, document, and provide at future appointments Ensure daily salt intake < 2300 mg/day  Hyperlipidemia Lab Results  Component Value Date/Time   LDLCALC 77 03/09/2020 11:51 AM   LDLCALC 68 07/01/2019 10:19 AM   LDLDIRECT 65.0 06/19/2017 09:01 AM  Pharmacist Clinical Goal(s): Over the next 180 days, patient will work with PharmD and providers to achieve LDL goal < 70 Current regimen:  Rosuvastatin 40 mg once  daily Interventions: Reviewed side effects and cholesterol goals - no side effects noted. Patient self care activities - Over the next 180 days, patient will: Continue with diet/exercise Reach out with any questions Depression Pharmacist Clinical Goal(s) Over the next 365 days, patient will work with PharmD and providers to ensure medication safety and minimize symptoms of depression. 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 Patient self care activities - Over the next 90 days, patient will: Complete patient section of patient assistance application, return to [place] once complete.  Medication management Pharmacist Clinical Goal(s): Over the next 365 days, patient will work with PharmD and providers to achieve optimal medication adherence Current pharmacy: [facility] Interventions Comprehensive medication review performed.  Continue current medication management strategy Patient self care activities - Over the next 365 days, patient will: Focus on medication adherence by taking medications as prescribed Report any questions or concerns to PharmD and/or provider(s) Initial goal documentation.       Track and Manage My Symptoms-Depression      Timeframe:  Long-Range Goal Priority:  High Start Date:  03/19/22                           Expected End Date: 10/20/21                      Follow Up Date 06/19/22    - avoid negative self-talk - exercise at least 2 to 3 times per week - have a plan for how to handle bad days - spend time or talk with others at least 2 to 3 times per week    Why is this important?   Keeping track of your progress will help your treatment team find the right mix of medicine and therapy for you.  Write in your journal every day.  Day-to-day changes in depression symptoms are normal. It may be more helpful to check your progress at the end of each week instead of every day.     Notes:       Weight (lb) < 145 lb (65.8 kg)      Lose weight by watching caloric intake.         This is a list of the screening recommended for you and due dates:  Health Maintenance  Topic Date Due   COVID-19 Vaccine (4 - Pfizer risk series) 08/25/2020   Flu Shot  04/03/2022   Mammogram  06/16/2022   Colon Cancer Screening  07/14/2024   Pneumonia Vaccine  Completed   DEXA scan (bone density measurement)  Completed   Hepatitis C Screening: USPSTF Recommendation to screen - Ages 29-79 yo.  Completed   Zoster (Shingles) Vaccine  Completed   HPV Vaccine  Aged Out   Tetanus Vaccine  Discontinued    Advanced directives: Advance directive discussed with you today. I have provided a copy for you to complete at home and have notarized. Once this is complete please bring a copy in to our office so we can scan it into your chart.  Conditions/risks identified: maintain health   Next appointment: Follow up in one year for your annual wellness visit    Preventive Care 65 Years and Older, Female Preventive care refers to lifestyle choices and visits with your health care provider that can promote health and wellness. What does preventive care include? A yearly physical exam. This  is also called an annual well check. Dental exams once or twice a year. Routine eye exams. Ask your health care provider how often you should have your eyes checked. Personal lifestyle choices, including: Daily care of your teeth and gums. Regular physical activity. Eating a healthy diet. Avoiding tobacco and drug use. Limiting alcohol use. Practicing safe sex. Taking low-dose aspirin every day. Taking vitamin and mineral supplements as recommended by your health care provider. What happens during an annual well check? The services and screenings done by your health care provider during your annual well check will depend on your age, overall health, lifestyle risk factors, and family history of disease. Counseling  Your health care provider may ask you questions about your: Alcohol use. Tobacco use. Drug use. Emotional well-being. Home and relationship  well-being. Sexual activity. Eating habits. History of falls. Memory and ability to understand (cognition). Work and work Statistician. Reproductive health. Screening  You may have the following tests or measurements: Height, weight, and BMI. Blood pressure. Lipid and cholesterol levels. These may be checked every 5 years, or more frequently if you are over 46 years old. Skin check. Lung cancer screening. You may have this screening every year starting at age 94 if you have a 30-pack-year history of smoking and currently smoke or have quit within the past 15 years. Fecal occult blood test (FOBT) of the stool. You may have this test every year starting at age 83. Flexible sigmoidoscopy or colonoscopy. You may have a sigmoidoscopy every 5 years or a colonoscopy every 10 years starting at age 72. Hepatitis C blood test. Hepatitis B blood test. Sexually transmitted disease (STD) testing. Diabetes screening. This is done by checking your blood sugar (glucose) after you have not eaten for a while (fasting). You may have this done every  1-3 years. Bone density scan. This is done to screen for osteoporosis. You may have this done starting at age 23. Mammogram. This may be done every 1-2 years. Talk to your health care provider about how often you should have regular mammograms. Talk with your health care provider about your test results, treatment options, and if necessary, the need for more tests. Vaccines  Your health care provider may recommend certain vaccines, such as: Influenza vaccine. This is recommended every year. Tetanus, diphtheria, and acellular pertussis (Tdap, Td) vaccine. You may need a Td booster every 10 years. Zoster vaccine. You may need this after age 62. Pneumococcal 13-valent conjugate (PCV13) vaccine. One dose is recommended after age 74. Pneumococcal polysaccharide (PPSV23) vaccine. One dose is recommended after age 58. Talk to your health care provider about which screenings and vaccines you need and how often you need them. This information is not intended to replace advice given to you by your health care provider. Make sure you discuss any questions you have with your health care provider. Document Released: 09/16/2015 Document Revised: 05/09/2016 Document Reviewed: 06/21/2015 Elsevier Interactive Patient Education  2017 [employer] Prevention in the Home Falls can cause injuries. They can happen to people of all ages. There are many things you can do to make your home safe and to help prevent falls. What can I do on the outside of my home? Regularly fix the edges of walkways and driveways and fix any cracks. Remove anything that might make you trip as you walk through a door, such as a raised step or threshold. Trim any bushes or trees on the path to your home. Use bright outdoor lighting. Clear any walking paths of anything that might make someone trip, such as rocks or tools. Regularly check to see if handrails are loose or broken. Make sure that both sides of any steps have  handrails. Any raised decks and porches should have guardrails on the edges. Have any leaves, snow, or ice cleared regularly. Use sand or salt on walking paths during winter. Clean up any spills in your garage right away. This includes oil or grease spills. What can I do in the bathroom? Use night lights. Install grab bars by the toilet and in the tub and shower. Do not use towel bars as grab bars. Use non-skid mats or decals in the tub or shower. If you need to sit down in the shower, use a plastic, non-slip stool. Keep the floor dry. Clean  up any water that spills on the floor as soon as it happens. Remove soap buildup in the tub or shower regularly. Attach bath mats securely with double-sided non-slip rug tape. Do not have throw rugs and other things on the floor that can make you trip. What can I do in the bedroom? Use night lights. Make sure that you have a light by your bed that is easy to reach. Do not use any sheets or blankets that are too big for your bed. They should not hang down onto the floor. Have a firm chair that has side arms. You can use this for support while you get dressed. Do not have throw rugs and other things on the floor that can make you trip. What can I do in the kitchen? Clean up any spills right away. Avoid walking on wet floors. Keep items that you use a lot in easy-to-reach places. If you need to reach something above you, use a strong step stool that has a grab bar. Keep electrical cords out of the way. Do not use floor polish or wax that makes floors slippery. If you must use wax, use non-skid floor wax. Do not have throw rugs and other things on the floor that can make you trip. What can I do with my stairs? Do not leave any items on the stairs. Make sure that there are handrails on both sides of the stairs and use them. Fix handrails that are broken or loose. Make sure that handrails are as long as the stairways. Check any carpeting to make sure  that it is firmly attached to the stairs. Fix any carpet that is loose or worn. Avoid having throw rugs at the top or bottom of the stairs. If you do have throw rugs, attach them to the floor with carpet tape. Make sure that you have a light switch at the top of the stairs and the bottom of the stairs. If you do not have them, ask someone to add them for you. What else can I do to help prevent falls? Wear shoes that: Do not have high heels. Have rubber bottoms. Are comfortable and fit you well. Are closed at the toe. Do not wear sandals. If you use a stepladder: Make sure that it is fully opened. Do not climb a closed stepladder. Make sure that both sides of the stepladder are locked into place. Ask someone to hold it for you, if possible. Clearly mark and make sure that you can see: Any grab bars or handrails. First and last steps. Where the edge of each step is. Use tools that help you move around (mobility aids) if they are needed. These include: Canes. Walkers. Scooters. Crutches. Turn on the lights when you go into a dark area. Replace any light bulbs as soon as they burn out. Set up your furniture so you have a clear path. Avoid moving your furniture around. If any of your floors are uneven, fix them. If there are any pets around you, be aware of where they are. Review your medicines with your doctor. Some medicines can make you feel dizzy. This can increase your chance of falling. Ask your doctor what other things that you can do to help prevent falls. This information is not intended to replace advice given to you by your health care provider. Make sure you discuss any questions you have with your health care provider. Document Released: 06/16/2009 Document Revised: 01/26/2016 Document Reviewed: 09/24/2014 Elsevier Interactive Patient Education  2017  [employer].

## 2022-06-08 ENCOUNTER — Ambulatory Visit: Payer: PPO

## 2022-06-11 ENCOUNTER — Encounter: Payer: Self-pay | Admitting: Physician Assistant

## 2022-06-11 ENCOUNTER — Ambulatory Visit (HOSPITAL_COMMUNITY): Payer: Medicare HMO | Attending: Physician Assistant

## 2022-06-11 DIAGNOSIS — Z952 Presence of prosthetic heart valve: Secondary | ICD-10-CM

## 2022-06-11 LAB — ECHOCARDIOGRAM COMPLETE
AV Mean grad: 17.3 mmHg
AV Peak grad: 32 mmHg
Ao pk vel: 2.83 m/s
Area-P 1/2: 2.83 cm2
S' Lateral: 2.4 cm

## 2022-07-10 ENCOUNTER — Telehealth: Payer: Self-pay | Admitting: Physician Assistant

## 2022-07-10 NOTE — H&P (View-Only) (Signed)
Cardiology Office Note:    Date:  07/11/2022   ID:  Katherine Foley, DOB Jun 08, 1951, MRN [id number]  PCP:  Marin Olp, [place] Prov[facility] Cardiologist:  Sherren Mocha, MD Cardiology APP:  Sh[facility]on [facility]     Referring MD: Marin Olp, MD   Chief Complaint:  F/u for CAD, hx of AVR and Chest Pain    Patient Profile: Coronary artery disease  NSTEMI in 2011 >> BMS to RCA  S/p DES to RI in 2012 S/p CABG in 2012 (L-LAD) + AVR Cath in 9/19: RI and RCA stents ok; L-LAD atretic, mild disease in mLAD >> med Rx Aortic valve Dz s/p bioprosthetic AVR in 2012 Echocardiogram 10/22: EF 60-65, AVR gradient 24 mmHg (? from 10 mmHg in 2019) Echo 06/11/2022: EF 60-65, no RWMA, mild reduced RVSF, s/p AVR with normal function (mean gradient 17.3 mmHg), RAP 3 Asthma/COPD  Hypertension  Hyperlipidemia  L parotidectomy (path negative) Carotid artery disease [place] 8/21: Bilateral ICA 1-39; R subclavian stenosis  Cardiac Studies & Procedures   CARDIAC CATHETERIZATION  CARDIAC CATHETERIZATION 05/26/2018  Narrative  Prox RCA to Mid RCA lesion is 25% stenosed.  Ost RCA to Prox RCA lesion is 30% stenosed.  Dist RCA lesion is 25% stenosed.  Previously placed Ramus stent (unknown type) is widely patent.  Mid Cx lesion is 25% stenosed.  Mid LAD lesion is 30% stenosed.  Patent stents.  No significnat change from 2017 angiogram.  Continue aggressive secondary prevention.  Findings Coronary Findings Diagnostic  Dominance: Right  Left Anterior Descending Mid LAD lesion is 30% stenosed.  Ramus Intermedius Vessel is large. Previously placed Ramus stent (unknown type) is widely patent.  Left Circumflex Mid Cx lesion is 25% stenosed.  Right Coronary Artery Ost RCA to Prox RCA lesion is 30% stenosed. Prox RCA to Mid RCA lesion is 25% stenosed. The lesion was previously treated. Dist RCA lesion is 25% stenosed.  Intervention  No interventions have been  documented.   CARDIAC CATHETERIZATION  CARDIAC CATHETERIZATION 07/06/2016  Narrative 1. Mildly dilated aorta with no angiographic evidence of bioprosthetic aortic valve insufficiency 2. Widely patent coronary arteries with patent stents in the ramus intermedius and RCA 3. Atretic LIMA-LAD graft because of normal native LAD flow  Suspect noncardiac symptoms. Would be reasonable to follow with annual echo studies since the mean gradient across the aortic bioprosthesis is mildly elevated compared with her previous echo  Findings Coronary Findings Diagnostic  Dominance: Right  Left Main Vessel is angiographically normal.  Left Anterior Descending  Ramus Intermedius Ramus lesion with no stenosis was previously treated.  Left Circumflex  Right Coronary Artery There is mild the vessel. The lesion was previously treated. Mild diffuse in-stent restenosis without change from previous cath studies  LIMA LIMA Graft To Dist LAD LIMA and is small. The graft is atretic.  The LIMA-LAD graft is small and atretic  Intervention  No interventions have been documented.   STRESS TESTS  MYOCARDIAL PERFUSION IMAGING 12/02/2015  Narrative  The left ventricular ejection fraction is hyperdynamic (>65%).  Nuclear stress EF: 70%.  There was no ST segment deviation noted during stress.  The study is normal.  This is a low risk study.  Normal pharmacologic nuclear stress test with no evidence of prior infarct or ischemia.   ECHOCARDIOGRAM  ECHOCARDIOGRAM COMPLETE 06/11/2022  Narrative ECHOCARDIOGRAM REPORT    [name] Date of Exam: 06/11/2022 Medical Rec #:  [id number]  Height:       62.5 in Accession #:    [id number]           Weight:       126.4 lb Date of Birth:  [birth year]             BSA:          1.582 m Patient Age:    71 years             BP:           120/82 mmHg Patient Gender: F                    HR:           66 bpm. Exam Location:   [facility]  Procedure: 2D Echo, Cardiac Doppler and Color Doppler  Indications:    Z95.2 Aortic valve replacement  History:        Patient has prior history of Echocardiogram examinations, most recent 06/12/2021. CAD and Previous Myocardial Infarction, Prior CABG, COPD; Risk Factors:Hypertension, Dyslipidemia and Former Smoker. Aortic stenosis. Aortic atherosclerosis. Aortic Valve: bioprosthetic valve is present in the aortic position.  Sonographer:    Diamond Nickel RCS Referring Phys: White   1. Left ventricular ejection fraction, by estimation, is 60 to 65%. The left ventricle has normal function. The left ventricle has no regional wall motion abnormalities. Left ventricular diastolic parameters are indeterminate. 2. Right ventricular systolic function is mildly reduced. The right ventricular size is normal. 3. The mitral valve is normal in structure. No evidence of mitral valve regurgitation. No evidence of mitral stenosis. 4. The aortic valve has been repaired/replaced. Aortic valve regurgitation is not visualized. No aortic stenosis is present. There is a bioprosthetic valve present in the aortic position. 5. The inferior vena cava is normal in size with greater than 50% respiratory variability, suggesting right atrial pressure of 3 mmHg.  FINDINGS Left Ventricle: Left ventricular ejection fraction, by estimation, is 60 to 65%. The left ventricle has normal function. The left ventricle has no regional wall motion abnormalities. The left ventricular internal cavity size was normal in size. There is no left ventricular hypertrophy. Left ventricular diastolic parameters are indeterminate.  Right Ventricle: The right ventricular size is normal. No increase in right ventricular wall thickness. Right ventricular systolic function is mildly reduced.  Left Atrium: Left atrial size was normal in size.  Right Atrium: Right atrial size was normal in  size.  Pericardium: There is no evidence of pericardial effusion.  Mitral Valve: The mitral valve is normal in structure. No evidence of mitral valve regurgitation. No evidence of mitral valve stenosis.  Tricuspid Valve: The tricuspid valve is normal in structure. Tricuspid valve regurgitation is not demonstrated. No evidence of tricuspid stenosis.  Aortic Valve: The aortic valve has been repaired/replaced. Aortic valve regurgitation is not visualized. No aortic stenosis is present. Aortic valve mean gradient measures 17.3 mmHg. Aortic valve peak gradient measures 32.0 mmHg. There is a bioprosthetic valve present in the aortic position.  Pulmonic Valve: The pulmonic valve was normal in structure. Pulmonic valve regurgitation is not visualized. No evidence of pulmonic stenosis.  Aorta: The aortic root is normal in size and structure.  Venous: The inferior vena cava is normal in size with greater than 50% respiratory variability, suggesting right atrial pressure of 3 mmHg.  IAS/Shunts: No atrial level shunt detected by color flow Doppler.   LEFT VENTRICLE PLAX 2D LVIDd:  4.60 cm Diastology LVIDs:         2.40 cm LV e' medial:    7.83 cm/s LV PW:         0.90 cm LV E/e' medial:  11.1 LV IVS:        0.90 cm LV e' lateral:   7.28 cm/s LV E/e' lateral: 12.0   RIGHT VENTRICLE RV Basal diam:  2.70 cm RV S prime:     7.65 cm/s TAPSE (M-mode): 1.4 cm  LEFT ATRIUM             Index        RIGHT ATRIUM          Index LA diam:        3.70 cm 2.34 cm/m   RA Area:     9.46 cm LA Vol (A2C):   35.0 ml 22.12 ml/m  RA Volume:   16.80 ml 10.62 ml/m LA Vol (A4C):   28.9 ml 18.26 ml/m LA Biplane Vol: 32.0 ml 20.22 ml/m AORTIC VALVE AV Vmax:           282.67 cm/s AV Vmean:          191.667 cm/s AV VTI:            0.681 m AV Peak Grad:      32.0 mmHg AV Mean Grad:      17.3 mmHg LVOT Vmax:         126.00 cm/s LVOT Vmean:        84.300 cm/s LVOT VTI:          0.295 m LVOT/AV VTI  ratio: 0.43  MITRAL VALVE MV Area (PHT): 2.83 cm    SHUNTS MV Decel Time: 268 msec    Systemic VTI: 0.30 m MV E velocity: 87.00 cm/s MV A velocity: 88.00 cm/s MV E/A ratio:  0.99  Kardie Tobb DO Electronically signed by Berniece Salines DO Signature Date/Time: 06/11/2022/6:24:12 PM    Final              History of Present Illness:   Ratasha Fabre is a 71 y.o. female with the above problem list.  She was last seen in October 2022.  She returns for follow-up.  Of note, recent follow-up echocardiogram demonstrated normal LV function and stable gradients across her bioprosthetic AVR.  She is here alone today. She notes dyspnea on exertion over the past several weeks. She has also been experiencing exertional chest pain described as pressure. She will have symptoms while walking her dog and has to stop to get relief. Her shortness of breath seems to be getting worse. She has not tried NTG. She has not had any assoc radiating symptoms, diaphoresis. She has been nauseated. She is under a lot of stress. She does feel that her symptoms remind her of her symptoms leading up to her MI. She does have Asthma/COPD and has noted improvement in her breathing with her rescue inhaler. She has not had orthopnea, leg edema, syncope.      Past Medical History:  Diagnosis Date   Adenomatous colon polyp    Anxiety    Anxiety/Depression 02/15/2009   Aortic stenosis s/p bioprosthetic AVR 07/07/2010   Echo 9/19:  Mild conc LVH, EF 60-65, no RWMA, Gr 1 DD, AVR ok (mean 10, peak 21), trivial MR, normal RVSF, mild TR // Echo 10/22: EF 60-65, no RWMA, normal RVSF, AVR with increased gradients (mean 24 increased from 10 in 2019) // Echocardiogram 10/23: EF 60-65,  no RWMA, mildly reduced RVSF, AVR w mean gradient 17.3 mmHg   Barrett's esophagus    04/2007; 2000   Carotid stenosis 05/17/2010   a. dopplers 78/24: RICA 23-53%; LICA 6-14% (follow up due 11/12)   COPD 07/23/2007   Coronary Artery Disease 05/17/2010    NSTEMI 8/11 - BMS to RCA;  cath 4/12: dLAD 50%, RI 80% (PCI), AVCFX 30%, pRCA 30%, stent ok, 40-50, then 50% dist RCA;  c. s/p Promus DES to RI 12/25/10;  d. 04/2011 CABG x 1 (LIMA->LAD) @ time of AVR // Cath in 9/19: RI and RCA stents ok; L-LAD atretic, mild disease in mLAD >> med Rx   Family history of breast cancer    2 daughters; PALB2 mutation in family   Genetic testing 02/10/2018   PALB2 analysis at [facility] - Negative for familial mutation   GERD    H/O hiatal hernia    Hemorrhoid thrombosis    "had it lanced" (03/25/2013)   Herpes simplex without mention of complication 43/15/4008   History of blood transfusion    'w/OHS" (03/25/2013)   Hyperlipidemia 08/29/2009   Hypertension 04/14/2007   Migraine HAs 04/20/2008   Myocardial infarction [facility]) 2011   "during catherization" (03/25/2013)   OSTEOPENIA 04/14/2007   Ovarian cyst    Parotid mass    left   Pneumonia    "multiple times" (03/25/2013)   Urine, incontinence, stress female    Current Medications: Current Meds  Medication Sig   albuterol (VENTOLIN HFA) 108 (90 Base) MCG/ACT inhaler Inhale 2 puffs into the lungs every 6 (six) hours as needed for wheezing or shortness of breath.   amLODipine (NORVASC) 5 MG tablet Take 1 tablet (5 mg total) by mouth daily.   aspirin EC 81 MG tablet Take 1 tablet (81 mg total) by mouth daily.   buPROPion (WELLBUTRIN XL) 300 MG 24 hr tablet TAKE ONE TABLET BY MOUTH ONCE DAILY   Calcium Carb-Cholecalciferol (CALCIUM 500/D PO) Take 2 tablets by mouth daily. Mag and zinc added   cetirizine (ZYRTEC) 10 MG tablet Take 10 mg by mouth at bedtime.   desvenlafaxine (PRISTIQ) 100 MG 24 hr tablet TAKE ONE TABLET BY MOUTH EVERY MORNING   dexlansoprazole (DEXILANT) 60 MG capsule Take 1 capsule (60 mg total) by mouth daily.   ezetimibe (ZETIA) 10 MG tablet TAKE ONE TABLET BY MOUTH ONCE DAILY   fluticasone (FLONASE) 50 MCG/ACT nasal spray Place 2 sprays into both nostrils as needed for allergies or rhinitis.    gabapentin (NEURONTIN) 300 MG capsule TAKE ONE CAPSULE BY MOUTH EVERYDAY AT BEDTIME   isosorbide mononitrate (IMDUR) 30 MG 24 hr tablet Take 0.5 tablets (15 mg total) by mouth daily.   LORazepam (ATIVAN) 0.5 MG tablet TAKE ONE TABLET BY MOUTH twice daily AS NEEDED FOR ANXIETY. Do not drive 8 hours after using.   Multiple Vitamins-Minerals (MULTIVITAMIN ADULT PO) Take 1 tablet by mouth daily.    propranolol (INDERAL) 20 MG tablet Take 1 tablet (20 mg total) by mouth 2 (two) times daily.   rosuvastatin (CRESTOR) 40 MG tablet Take 1 tablet (40 mg total) by mouth daily.   SPIRIVA HANDIHALER 18 MCG inhalation capsule Place 1 capsule (18 mcg total) into inhaler and inhale 2 (two) times daily.   SYMBICORT 160-4.5 MCG/ACT inhaler Inhale 2 puffs into the lungs 2 (two) times daily.   valACYclovir (VALTREX) 1000 MG tablet Take 1,000 mg by mouth as needed (for fever blisters).   [DISCONTINUED] nitroGLYCERIN (NITROSTAT) 0.4 MG SL tablet Place  1 tablet (0.4 mg total) under the tongue every 5 (five) minutes as needed for chest pain (if pain does not resolve with 2 doses, call 911).    Allergies:   Codeine sulfate and Hydrocodone-acetaminophen   Social History   Tobacco Use   Smoking status: Former    Packs/day: 1.50    Years: 44.00    Total pack years: 66.00    Types: Cigarettes    Quit date: 09/03/2005    Years since quitting: 16.8   Smokeless tobacco: Never  Vaping Use   Vaping Use: Never used  Substance Use Topics   Alcohol use: Not Currently    Alcohol/week: 0.0 standard drinks of alcohol    Comment: social   Drug use: No    Family Hx: The patient's family history includes Asthma in her son; Breast cancer in her daughter; Breast cancer (age of onset: 45) in her daughter; COPD in her father; Cancer in an other family member; Cirrhosis in her sister; Coronary artery disease in an other family member; Heart disease in her father, maternal uncle, mother, and another family member; Rheum arthritis  in her father; Stroke in her brother. There is no history of Colon cancer or Colon polyps.  Review of Systems  Constitutional: Negative for fever.  Respiratory:  Positive for cough (chronic) and wheezing (chronic).   Gastrointestinal:  Negative for hematochezia.  Genitourinary:  Negative for hematuria.     EKGs/Labs/Other Test Reviewed:    EKG:  EKG is   ordered today.  The ekg ordered today demonstrates NSR, HR 64, normal axis, non-specific ST-TW changes, QTc 427 ms    Recent Labs: 03/05/2022: ALT 32; BUN 13; Creatinine, Ser 0.71; Potassium 4.4; Sodium 136 04/09/2022: Hemoglobin 12.1; Platelets 182.0   Recent Lipid Panel Recent Labs    03/05/22 0912  CHOL 112  TRIG 86.0  HDL 51.20  VLDL 17.2  LDLCALC 44      Risk Assessment/Calculations/Metrics:              Physical Exam:    VS:  BP 110/60   Pulse 64   Ht _0  (1.575 m)   Wt 129 lb 6.4 oz (58.7 kg)   SpO2 93%   BMI 23.67 kg/m     Wt Readings from Last 3 Encounters:  07/11/22 129 lb 6.4 oz (58.7 kg)  03/05/22 126 lb 6.4 oz (57.3 kg)  06/21/21 120 lb 3.2 oz (54.5 kg)    Constitutional:      Appearance: Healthy appearance. Not in distress.  Neck:     Vascular: JVD normal.  Pulmonary:     Effort: Pulmonary effort is normal.     Breath sounds: No wheezing. No rales.  Cardiovascular:     Normal rate. Regular rhythm. Normal S1. Normal S2.      Murmurs: There is a grade 1/6 systolic murmur at the URSB.  Edema:    Peripheral edema absent.  Abdominal:     Palpations: Abdomen is soft.  Skin:    General: Skin is warm and dry.  Neurological:     Mental Status: Alert and oriented to person, place and time.          ASSESSMENT & PLAN:   Coronary artery disease involving native coronary artery with angina pectoris ([place]) Hx of NSTEMI in 2011 tx with BMS to RCA and subsequent DES to RI in 2012 and CABG (L-LAD) in 2012 at the time of her AVR. Cardiac catheterization in 2019 demonstrated patent stents in RI  and  RCA, atretic LIMA, patent LAD and mild non-obstructive disease elsewhere. She now presents with several weeks of exertional chest pain and shortness of breath. She notes symptoms reminiscent of her prior angina. EKG does not demonstrate acute changes. We discussed rationale for further testing including stress testing vs cardiac catheterization. Given her symptoms, I favor cardiac catheterization. With shared decision making, the patient agreed to proceed. I d/w Dr. Curt Bears (attending MD) who agreed.  Arrange R and L cardiac catheterization  BMET, BNP, CBC Imdur 15 mg once daily F/u after cardiac catheterization   Aortic stenosis S/p AVR in 2012. Recent echocardiogram with stable AVR. Continue SBE prophylaxis.   Chronic obstructive pulmonary disease (HCC) If coronary anatomy is stable and no evidence of CHF, would recommend f/u with primary care for management of COPD.  Essential hypertension The patient's blood pressure is controlled on her current regimen.  Continue current therapy.    HLD (hyperlipidemia) LDL in July 2023 was optimal. Continue Zetia 10 mg once daily, Crestor 40 mg once daily.  Aortic atherosclerosis (HCC) Continue ASA, statin Rx.          Shared Decision Making/Informed Consent The risks [stroke (1 in 1000), death (1 in 1000), kidney failure [usually temporary] (1 in 500), bleeding (1 in 200), allergic reaction [possibly serious] (1 in 200)], benefits (diagnostic support and management of coronary artery disease) and alternatives of a cardiac catheterization were discussed in detail with Ms. Ayars and she is willing to proceed.   Dispo:  Return for Post Procedure Follow Up, w/ Dr. Burt Knack, or Richardson Dopp, PA-C.   Medication Adjustments/Labs and Tests Ordered: Current medicines are reviewed at length with the patient today.  Concerns regarding medicines are outlined above.  Tests Ordered: Orders Placed This Encounter  Procedures   Basic metabolic panel   CBC    Pro b natriuretic peptide (BNP)   EKG 12-Lead   Medication Changes: Meds ordered this encounter  Medications   isosorbide mononitrate (IMDUR) 30 MG 24 hr tablet    Sig: Take 0.5 tablets (15 mg total) by mouth daily.    Dispense:  45 tablet    Refill:  3   nitroGLYCERIN (NITROSTAT) 0.4 MG SL tablet    Sig: Place 1 tablet (0.4 mg total) under the tongue every 5 (five) minutes as needed for chest pain (if pain does not resolve with 2 doses, call 911).    Dispense:  30 tablet    Refill:  5   Signed, Richardson Dopp, PA-C  07/11/2022 1:18 PM    [place], [place], [place]  [address] Phone: [phone]; Fax: [phone]

## 2022-07-10 NOTE — Progress Notes (Unsigned)
Cardiology Office Note:    Date:  07/11/2022   ID:  Katherine Foley, DOB 1951-02-18, MRN [id number]  PCP:  Marin Olp, [facility] Providers Cardiologist:  Sherren Mocha, MD Cardiology APP:  [facility]     Referring MD: Marin Olp, MD   Chief Complaint:  F/u for CAD, hx of AVR and Chest Pain    Patient Profile: Coronary artery disease  NSTEMI in 2011 >> BMS to RCA  S/p DES to RI in 2012 S/p CABG in 2012 (L-LAD) + AVR Cath in 9/19: RI and RCA stents ok; L-LAD atretic, mild disease in mLAD >> med Rx Aortic valve Dz s/p bioprosthetic AVR in 2012 Echocardiogram 10/22: EF 60-65, AVR gradient 24 mmHg (? from 10 mmHg in 2019) Echo 06/11/2022: EF 60-65, no RWMA, mild reduced RVSF, s/p AVR with normal function (mean gradient 17.3 mmHg), RAP 3 Asthma/COPD  Hypertension  Hyperlipidemia  L parotidectomy (path negative) Carotid artery disease [place] 8/21: Bilateral ICA 1-39; R subclavian stenosis  Cardiac Studies & Procedures   CARDIAC CATHETERIZATION  CARDIAC CATHETERIZATION 05/26/2018  Narrative  Prox RCA to Mid RCA lesion is 25% stenosed.  Ost RCA to Prox RCA lesion is 30% stenosed.  Dist RCA lesion is 25% stenosed.  Previously placed Ramus stent (unknown type) is widely patent.  Mid Cx lesion is 25% stenosed.  Mid LAD lesion is 30% stenosed.  Patent stents.  No significnat change from 2017 angiogram.  Continue aggressive secondary prevention.  Findings Coronary Findings Diagnostic  Dominance: Right  Left Anterior Descending Mid LAD lesion is 30% stenosed.  Ramus Intermedius Vessel is large. Previously placed Ramus stent (unknown type) is widely patent.  Left Circumflex Mid Cx lesion is 25% stenosed.  Right Coronary Artery Ost RCA to Prox RCA lesion is 30% stenosed. Prox RCA to Mid RCA lesion is 25% stenosed. The lesion was previously treated. Dist RCA lesion is 25% stenosed.  Intervention  No interventions have been  documented.   CARDIAC CATHETERIZATION  CARDIAC CATHETERIZATION 07/06/2016  Narrative 1. Mildly dilated aorta with no angiographic evidence of bioprosthetic aortic valve insufficiency 2. Widely patent coronary arteries with patent stents in the ramus intermedius and RCA 3. Atretic LIMA-LAD graft because of normal native LAD flow  Suspect noncardiac symptoms. Would be reasonable to follow with annual echo studies since the mean gradient across the aortic bioprosthesis is mildly elevated compared with her previous echo  Findings Coronary Findings Diagnostic  Dominance: Right  Left Main Vessel is angiographically normal.  Left Anterior Descending  Ramus Intermedius Ramus lesion with no stenosis was previously treated.  Left Circumflex  Right Coronary Artery There is mild the vessel. The lesion was previously treated. Mild diffuse in-stent restenosis without change from previous cath studies  LIMA LIMA Graft To Dist LAD LIMA and is small. The graft is atretic.  The LIMA-LAD graft is small and atretic  Intervention  No interventions have been documented.   STRESS TESTS  MYOCARDIAL PERFUSION IMAGING 12/02/2015  Narrative  The left ventricular ejection fraction is hyperdynamic (>65%).  Nuclear stress EF: 70%.  There was no ST segment deviation noted during stress.  The study is normal.  This is a low risk study.  Normal pharmacologic nuclear stress test with no evidence of prior infarct or ischemia.   ECHOCARDIOGRAM  ECHOCARDIOGRAM COMPLETE 06/11/2022  Narrative ECHOCARDIOGRAM REPORT    [name] Schaller Date of Exam: 06/11/2022 Medical Rec #:  [id number]  Height:       62.5 in Accession #:    [id number]           Weight:       126.4 lb Date of Birth:  Sep 24, 1950             BSA:          1.582 m Patient Age:    71 years             BP:           120/82 mmHg Patient Gender: F                    HR:           66 bpm. Exam Location:   [facility]  Procedure: 2D Echo, Cardiac Doppler and Color Doppler  Indications:    Z95.2 Aortic valve replacement  History:        Patient has prior history of Echocardiogram examinations, most recent 06/12/2021. CAD and Previous Myocardial Infarction, Prior CABG, COPD; Risk Factors:Hypertension, Dyslipidemia and Former Smoker. Aortic stenosis. Aortic atherosclerosis. Aortic Valve: bioprosthetic valve is present in the aortic position.  Sonographer:    Diamond Nickel RCS Referring Phys: [place]   1. Left ventricular ejection fraction, by estimation, is 60 to 65%. The left ventricle has normal function. The left ventricle has no regional wall motion abnormalities. Left ventricular diastolic parameters are indeterminate. 2. Right ventricular systolic function is mildly reduced. The right ventricular size is normal. 3. The mitral valve is normal in structure. No evidence of mitral valve regurgitation. No evidence of mitral stenosis. 4. The aortic valve has been repaired/replaced. Aortic valve regurgitation is not visualized. No aortic stenosis is present. There is a bioprosthetic valve present in the aortic position. 5. The inferior vena cava is normal in size with greater than 50% respiratory variability, suggesting right atrial pressure of 3 mmHg.  FINDINGS Left Ventricle: Left ventricular ejection fraction, by estimation, is 60 to 65%. The left ventricle has normal function. The left ventricle has no regional wall motion abnormalities. The left ventricular internal cavity size was normal in size. There is no left ventricular hypertrophy. Left ventricular diastolic parameters are indeterminate.  Right Ventricle: The right ventricular size is normal. No increase in right ventricular wall thickness. Right ventricular systolic function is mildly reduced.  Left Atrium: Left atrial size was normal in size.  Right Atrium: Right atrial size was normal in  size.  Pericardium: There is no evidence of pericardial effusion.  Mitral Valve: The mitral valve is normal in structure. No evidence of mitral valve regurgitation. No evidence of mitral valve stenosis.  Tricuspid Valve: The tricuspid valve is normal in structure. Tricuspid valve regurgitation is not demonstrated. No evidence of tricuspid stenosis.  Aortic Valve: The aortic valve has been repaired/replaced. Aortic valve regurgitation is not visualized. No aortic stenosis is present. Aortic valve mean gradient measures 17.3 mmHg. Aortic valve peak gradient measures 32.0 mmHg. There is a bioprosthetic valve present in the aortic position.  Pulmonic Valve: The pulmonic valve was normal in structure. Pulmonic valve regurgitation is not visualized. No evidence of pulmonic stenosis.  Aorta: The aortic root is normal in size and structure.  Venous: The inferior vena cava is normal in size with greater than 50% respiratory variability, suggesting right atrial pressure of 3 mmHg.  IAS/Shunts: No atrial level shunt detected by color flow Doppler.   LEFT VENTRICLE PLAX 2D LVIDd:  4.60 cm Diastology LVIDs:         2.40 cm LV e' medial:    7.83 cm/s LV PW:         0.90 cm LV E/e' medial:  11.1 LV IVS:        0.90 cm LV e' lateral:   7.28 cm/s LV E/e' lateral: 12.0   RIGHT VENTRICLE RV Basal diam:  2.70 cm RV S prime:     7.65 cm/s TAPSE (M-mode): 1.4 cm  LEFT ATRIUM             Index        RIGHT ATRIUM          Index LA diam:        3.70 cm 2.34 cm/m   RA Area:     9.46 cm LA Vol (A2C):   35.0 ml 22.12 ml/m  RA Volume:   16.80 ml 10.62 ml/m LA Vol (A4C):   28.9 ml 18.26 ml/m LA Biplane Vol: 32.0 ml 20.22 ml/m AORTIC VALVE AV Vmax:           282.67 cm/s AV Vmean:          191.667 cm/s AV VTI:            0.681 m AV Peak Grad:      32.0 mmHg AV Mean Grad:      17.3 mmHg LVOT Vmax:         126.00 cm/s LVOT Vmean:        84.300 cm/s LVOT VTI:          0.295 m LVOT/AV VTI  ratio: 0.43  MITRAL VALVE MV Area (PHT): 2.83 cm    SHUNTS MV Decel Time: 268 msec    Systemic VTI: 0.30 m MV E velocity: 87.00 cm/s MV A velocity: 88.00 cm/s MV E/A ratio:  0.99  Kardie Tobb DO Electronically signed by Berniece Salines DO Signature Date/Time: 06/11/2022/6:24:12 PM    Final              History of Present Illness:   Katherine Foley is a 71 y.o. female with the above problem list.  She was last seen in October 2022.  She returns for follow-up.  Of note, recent follow-up echocardiogram demonstrated normal LV function and stable gradients across her bioprosthetic AVR.  She is here alone today. She notes dyspnea on exertion over the past several weeks. She has also been experiencing exertional chest pain described as pressure. She will have symptoms while walking her dog and has to stop to get relief. Her shortness of breath seems to be getting worse. She has not tried NTG. She has not had any assoc radiating symptoms, diaphoresis. She has been nauseated. She is under a lot of stress. She does feel that her symptoms remind her of her symptoms leading up to her MI. She does have Asthma/COPD and has noted improvement in her breathing with her rescue inhaler. She has not had orthopnea, leg edema, syncope.      Past Medical History:  Diagnosis Date   Adenomatous colon polyp    Anxiety    Anxiety/Depression 02/15/2009   Aortic stenosis s/p bioprosthetic AVR 07/07/2010   Echo 9/19:  Mild conc LVH, EF 60-65, no RWMA, Gr 1 DD, AVR ok (mean 10, peak 21), trivial MR, normal RVSF, mild TR // Echo 10/22: EF 60-65, no RWMA, normal RVSF, AVR with increased gradients (mean 24 increased from 10 in 2019) // Echocardiogram 10/23: EF 60-65,  no RWMA, mildly reduced RVSF, AVR w mean gradient 17.3 mmHg   Barrett's esophagus    04/2007; 2000   Carotid stenosis 05/17/2010   a. dopplers 00/17: RICA 49-44%; LICA 9-67% (follow up due 11/12)   COPD 07/23/2007   Coronary Artery Disease 05/17/2010    NSTEMI 8/11 - BMS to RCA;  cath 4/12: dLAD 50%, RI 80% (PCI), AVCFX 30%, pRCA 30%, stent ok, 40-50, then 50% dist RCA;  c. s/p Promus DES to RI 12/25/10;  d. 04/2011 CABG x 1 (LIMA->LAD) @ time of AVR // Cath in 9/19: RI and RCA stents ok; L-LAD atretic, mild disease in mLAD >> med Rx   Family history of breast cancer    2 daughters; PALB2 mutation in family   Genetic testing 02/10/2018   PALB2 analysis at [facility] - Negative for familial mutation   GERD    H/O hiatal hernia    Hemorrhoid thrombosis    "had it lanced" (03/25/2013)   Herpes simplex without mention of complication 59/16/3846   History of blood transfusion    'w/OHS" (03/25/2013)   Hyperlipidemia 08/29/2009   Hypertension 04/14/2007   Migraine HAs 04/20/2008   Myocardial infarction [facility]) 2011   "during catherization" (03/25/2013)   OSTEOPENIA 04/14/2007   Ovarian cyst    Parotid mass    left   Pneumonia    "multiple times" (03/25/2013)   Urine, incontinence, stress female    Current Medications: Current Meds  Medication Sig   albuterol (VENTOLIN HFA) 108 (90 Base) MCG/ACT inhaler Inhale 2 puffs into the lungs every 6 (six) hours as needed for wheezing or shortness of breath.   amLODipine (NORVASC) 5 MG tablet Take 1 tablet (5 mg total) by mouth daily.   aspirin EC 81 MG tablet Take 1 tablet (81 mg total) by mouth daily.   buPROPion (WELLBUTRIN XL) 300 MG 24 hr tablet TAKE ONE TABLET BY MOUTH ONCE DAILY   Calcium Carb-Cholecalciferol (CALCIUM 500/D PO) Take 2 tablets by mouth daily. Mag and zinc added   cetirizine (ZYRTEC) 10 MG tablet Take 10 mg by mouth at bedtime.   desvenlafaxine (PRISTIQ) 100 MG 24 hr tablet TAKE ONE TABLET BY MOUTH EVERY MORNING   dexlansoprazole (DEXILANT) 60 MG capsule Take 1 capsule (60 mg total) by mouth daily.   ezetimibe (ZETIA) 10 MG tablet TAKE ONE TABLET BY MOUTH ONCE DAILY   fluticasone (FLONASE) 50 MCG/ACT nasal spray Place 2 sprays into both nostrils as needed for allergies or rhinitis.    gabapentin (NEURONTIN) 300 MG capsule TAKE ONE CAPSULE BY MOUTH EVERYDAY AT BEDTIME   isosorbide mononitrate (IMDUR) 30 MG 24 hr tablet Take 0.5 tablets (15 mg total) by mouth daily.   LORazepam (ATIVAN) 0.5 MG tablet TAKE ONE TABLET BY MOUTH twice daily AS NEEDED FOR ANXIETY. Do not drive 8 hours after using.   Multiple Vitamins-Minerals (MULTIVITAMIN ADULT PO) Take 1 tablet by mouth daily.    propranolol (INDERAL) 20 MG tablet Take 1 tablet (20 mg total) by mouth 2 (two) times daily.   rosuvastatin (CRESTOR) 40 MG tablet Take 1 tablet (40 mg total) by mouth daily.   SPIRIVA HANDIHALER 18 MCG inhalation capsule Place 1 capsule (18 mcg total) into inhaler and inhale 2 (two) times daily.   SYMBICORT 160-4.5 MCG/ACT inhaler Inhale 2 puffs into the lungs 2 (two) times daily.   valACYclovir (VALTREX) 1000 MG tablet Take 1,000 mg by mouth as needed (for fever blisters).   [DISCONTINUED] nitroGLYCERIN (NITROSTAT) 0.4 MG SL tablet Place  1 tablet (0.4 mg total) under the tongue every 5 (five) minutes as needed for chest pain (if pain does not resolve with 2 doses, call 911).    Allergies:   Codeine sulfate and Hydrocodone-acetaminophen   Social History   Tobacco Use   Smoking status: Former    Packs/day: 1.50    Years: 44.00    Total pack years: 66.00    Types: Cigarettes    Quit date: 09/03/2005    Years since quitting: 16.8   Smokeless tobacco: Never  Vaping Use   Vaping Use: Never used  Substance Use Topics   Alcohol use: Not Currently    Alcohol/week: 0.0 standard drinks of alcohol    Comment: social   Drug use: No    Family Hx: The patient's family history includes Asthma in her son; Breast cancer in her daughter; Breast cancer (age of onset: 19) in her daughter; COPD in her father; Cancer in an other family member; Cirrhosis in her sister; Coronary artery disease in an other family member; Heart disease in her father, maternal uncle, mother, and another family member; Rheum arthritis  in her father; Stroke in her brother. There is no history of Colon cancer or Colon polyps.  Review of Systems  Constitutional: Negative for fever.  Respiratory:  Positive for cough (chronic) and wheezing (chronic).   Gastrointestinal:  Negative for hematochezia.  Genitourinary:  Negative for hematuria.     EKGs/Labs/Other Test Reviewed:    EKG:  EKG is   ordered today.  The ekg ordered today demonstrates NSR, HR 64, normal axis, non-specific ST-TW changes, QTc 427 ms    Recent Labs: 03/05/2022: ALT 32; BUN 13; Creatinine, Ser 0.71; Potassium 4.4; Sodium 136 04/09/2022: Hemoglobin 12.1; Platelets 182.0   Recent Lipid Panel Recent Labs    03/05/22 0912  CHOL 112  TRIG 86.0  HDL 51.20  VLDL 17.2  LDLCALC 44      Risk Assessment/Calculations/Metrics:              Physical Exam:    VS:  BP 110/60   Pulse 64   Ht _0  (1.575 m)   Wt 129 lb 6.4 oz (58.7 kg)   SpO2 93%   BMI 23.67 kg/m     Wt Readings from Last 3 Encounters:  07/11/22 129 lb 6.4 oz (58.7 kg)  03/05/22 126 lb 6.4 oz (57.3 kg)  06/21/21 120 lb 3.2 oz (54.5 kg)    Constitutional:      Appearance: Healthy appearance. Not in distress.  Neck:     Vascular: JVD normal.  Pulmonary:     Effort: Pulmonary effort is normal.     Breath sounds: No wheezing. No rales.  Cardiovascular:     Normal rate. Regular rhythm. Normal S1. Normal S2.      Murmurs: There is a grade 1/6 systolic murmur at the URSB.  Edema:    Peripheral edema absent.  Abdominal:     Palpations: Abdomen is soft.  Skin:    General: Skin is warm and dry.  Neurological:     Mental Status: Alert and oriented to person, place and time.          ASSESSMENT & PLAN:   Coronary artery disease involving native coronary artery with angina pectoris ([place]) Hx of NSTEMI in 2011 tx with BMS to RCA and subsequent DES to RI in 2012 and CABG (L-LAD) in 2012 at the time of her AVR. Cardiac catheterization in 2019 demonstrated patent stents in RI  and  RCA, atretic LIMA, patent LAD and mild non-obstructive disease elsewhere. She now presents with several weeks of exertional chest pain and shortness of breath. She notes symptoms reminiscent of her prior angina. EKG does not demonstrate acute changes. We discussed rationale for further testing including stress testing vs cardiac catheterization. Given her symptoms, I favor cardiac catheterization. With shared decision making, the patient agreed to proceed. I d/w Dr. Curt Bears (attending MD) who agreed.  Arrange R and L cardiac catheterization  BMET, BNP, CBC Imdur 15 mg once daily F/u after cardiac catheterization   Aortic stenosis S/p AVR in 2012. Recent echocardiogram with stable AVR. Continue SBE prophylaxis.   Chronic obstructive pulmonary disease (HCC) If coronary anatomy is stable and no evidence of CHF, would recommend f/u with primary care for management of COPD.  Essential hypertension The patient's blood pressure is controlled on her current regimen.  Continue current therapy.    HLD (hyperlipidemia) LDL in July 2023 was optimal. Continue Zetia 10 mg once daily, Crestor 40 mg once daily.  Aortic atherosclerosis (HCC) Continue ASA, statin Rx.          Shared Decision Making/Informed Consent The risks [stroke (1 in 1000), death (1 in 1000), kidney failure [usually temporary] (1 in 500), bleeding (1 in 200), allergic reaction [possibly serious] (1 in 200)], benefits (diagnostic support and management of coronary artery disease) and alternatives of a cardiac catheterization were discussed in detail with Ms. Shuffield and she is willing to proceed.   Dispo:  Return for Post Procedure Follow Up, w/ Dr. Burt Knack, or Richardson Dopp, PA-C.   Medication Adjustments/Labs and Tests Ordered: Current medicines are reviewed at length with the patient today.  Concerns regarding medicines are outlined above.  Tests Ordered: Orders Placed This Encounter  Procedures   Basic metabolic panel   CBC    Pro b natriuretic peptide (BNP)   EKG 12-Lead   Medication Changes: Meds ordered this encounter  Medications   isosorbide mononitrate (IMDUR) 30 MG 24 hr tablet    Sig: Take 0.5 tablets (15 mg total) by mouth daily.    Dispense:  45 tablet    Refill:  3   nitroGLYCERIN (NITROSTAT) 0.4 MG SL tablet    Sig: Place 1 tablet (0.4 mg total) under the tongue every 5 (five) minutes as needed for chest pain (if pain does not resolve with 2 doses, call 911).    Dispense:  30 tablet    Refill:  5   Signed, Richardson Dopp, PA-C  07/11/2022 1:18 PM    [address], [place], [place]  [address] Phone: [phone]; Fax: [phone]

## 2022-07-10 NOTE — Telephone Encounter (Signed)
Scheduled patient to see Richardson Dopp tomorrow at 8:50 am

## 2022-07-11 ENCOUNTER — Ambulatory Visit: Payer: Medicare HMO | Attending: Physician Assistant | Admitting: Physician Assistant

## 2022-07-11 ENCOUNTER — Encounter: Payer: Self-pay | Admitting: Physician Assistant

## 2022-07-11 VITALS — BP 110/60 | HR 64 | Ht 62.0 in | Wt 129.4 lb

## 2022-07-11 DIAGNOSIS — I350 Nonrheumatic aortic (valve) stenosis: Secondary | ICD-10-CM

## 2022-07-11 DIAGNOSIS — I10 Essential (primary) hypertension: Secondary | ICD-10-CM | POA: Diagnosis not present

## 2022-07-11 DIAGNOSIS — E7800 Pure hypercholesterolemia, unspecified: Secondary | ICD-10-CM | POA: Diagnosis not present

## 2022-07-11 DIAGNOSIS — R0602 Shortness of breath: Secondary | ICD-10-CM | POA: Diagnosis not present

## 2022-07-11 DIAGNOSIS — I700 Atherosclerosis of aorta: Secondary | ICD-10-CM | POA: Diagnosis not present

## 2022-07-11 DIAGNOSIS — J449 Chronic obstructive pulmonary disease, unspecified: Secondary | ICD-10-CM

## 2022-07-11 DIAGNOSIS — R079 Chest pain, unspecified: Secondary | ICD-10-CM | POA: Diagnosis not present

## 2022-07-11 DIAGNOSIS — I2510 Atherosclerotic heart disease of native coronary artery without angina pectoris: Secondary | ICD-10-CM | POA: Diagnosis not present

## 2022-07-11 DIAGNOSIS — I25119 Atherosclerotic heart disease of native coronary artery with unspecified angina pectoris: Secondary | ICD-10-CM

## 2022-07-11 MED ORDER — ISOSORBIDE MONONITRATE ER 30 MG PO TB24: 15.0000 mg | ORAL_TABLET | Freq: Every day | ORAL | 3 refills | Status: DC

## 2022-07-11 MED ORDER — NITROGLYCERIN 0.4 MG SL SUBL: 0.4000 mg | SUBLINGUAL_TABLET | SUBLINGUAL | 5 refills | Status: AC | PRN

## 2022-07-11 NOTE — Assessment & Plan Note (Signed)
Continue ASA, statin Rx.  

## 2022-07-11 NOTE — Patient Instructions (Signed)
Medication Instructions:  Your physician has recommended you make the following change in your medication:   START Imdur 30 mg taking 1/2 tablet daily   *If you need a refill on your cardiac medications before your next appointment, please call your pharmacy*   Lab Work: TODAY:  BMET, CBC, * PRO BNP  If you have labs (blood work) drawn today and your tests are completely normal, you will receive your results only by: [place] (if you have MyChart) OR A paper copy in the mail If you have any lab test that is abnormal or we need to change your treatment, we will call you to review the results.   Testing/Procedures: Your physician has requested that you have a cardiac catheterization. Cardiac catheterization is used to diagnose and/or treat various heart conditions. Doctors may recommend this procedure for a number of different reasons. The most common reason is to evaluate chest pain. Chest pain can be a symptom of coronary artery disease (CAD), and cardiac catheterization can show whether plaque is narrowing or blocking your heart's arteries. This procedure is also used to evaluate the valves, as well as measure the blood flow and oxygen levels in different parts of your heart. For further information please visit [email or web address]. Please follow instruction sheet, BELOW:        Cardiac/Peripheral Catheterization   You are scheduled for a Cardiac Catheterization on Friday, November 10 with Dr. Lenna Sciara.  1. Please arrive at the Main Entrance A at [facility]: [place], [place] [address] on November 10 at 7:00 AM (This time is two hours before your procedure to ensure your preparation). Free valet parking service is available. You will check in at ADMITTING. The support person will be asked to wait in the waiting room.  It is OK to have someone drop you off and come back when you are ready to be discharged.        Special note: Every effort is made to  have your procedure done on time. Please understand that emergencies sometimes delay scheduled procedures.   . 2. Diet: Do not eat solid foods after midnight.  You may have clear liquids until 5 AM the day of the procedure.  3. Labs: You will need to have blood drawn on TODAY  4. Medication instructions in preparation for your procedure:   Contrast Allergy: No  On the morning of your procedure, take Aspirin 81 mg and any morning medicines NOT listed above.  You may use sips of water.  5. Plan to go home the same day, you will only stay overnight if medically necessary. 6. You MUST have a responsible adult to drive you home. 7. An adult MUST be with you the first 24 hours after you arrive home. 8. Bring a current list of your medications, and the last time and date medication taken. 9. Bring ID and current insurance cards. 10.Please wear clothes that are easy to get on and off and wear slip-on shoes.  Thank you for allowing [place] to care for you!   --  Invasive Cardiovascular services     Follow-Up: At [facility], you and your health needs are our priority.  As part of our continuing mission to provide you with exceptional heart care, we have created designated Provider Care Teams.  These Care Teams include your primary Cardiologist (physician) and Advanced Practice Providers (APPs -  Physician Assistants and Nurse Practitioners) who all work together to provide you with the  care you need, when you need it.  We recommend signing up for the patient portal called "MyChart".  Sign up information is provided on this After Visit Summary.  MyChart is used to connect with patients for Virtual Visits (Telemedicine).  Patients are able to view lab/test results, encounter notes, upcoming appointments, etc.  Non-urgent messages can be sent to your provider as well.   To learn more about what you can do with MyChart, go to [email or web address].    Your next appointment:   2  week(s)  07/31/22 arrive at 10:40  The format for your next appointment:   In Person  Provider:   Richardson Dopp, PA-C         Other Instructions   Important Information About Sugar

## 2022-07-11 NOTE — Assessment & Plan Note (Signed)
If coronary anatomy is stable and no evidence of CHF, would recommend f/u with primary care for management of COPD.

## 2022-07-11 NOTE — Assessment & Plan Note (Signed)
The patient's blood pressure is controlled on her current regimen.  Continue current therapy.   

## 2022-07-11 NOTE — Assessment & Plan Note (Addendum)
Hx of NSTEMI in 2011 tx with BMS to RCA and subsequent DES to RI in 2012 and CABG (L-LAD) in 2012 at the time of her AVR. Cardiac catheterization in 2019 demonstrated patent stents in RI and RCA, atretic LIMA, patent LAD and mild non-obstructive disease elsewhere. She now presents with several weeks of exertional chest pain and shortness of breath. She notes symptoms reminiscent of her prior angina. EKG does not demonstrate acute changes. We discussed rationale for further testing including stress testing vs cardiac catheterization. Given her symptoms, I favor cardiac catheterization. With shared decision making, the patient agreed to proceed. I d/w Dr. Curt Bears (attending MD) who agreed.  Arrange R and L cardiac catheterization  BMET, BNP, CBC Imdur 15 mg once daily F/u after cardiac catheterization

## 2022-07-11 NOTE — Assessment & Plan Note (Signed)
S/p AVR in 2012. Recent echocardiogram with stable AVR. Continue SBE prophylaxis.

## 2022-07-11 NOTE — Assessment & Plan Note (Signed)
LDL in July 2023 was optimal. Continue Zetia 10 mg once daily, Crestor 40 mg once daily.

## 2022-07-12 ENCOUNTER — Telehealth: Payer: Self-pay | Admitting: Internal Medicine

## 2022-07-12 ENCOUNTER — Telehealth: Payer: Self-pay | Admitting: *Deleted

## 2022-07-12 LAB — CBC
Hematocrit: 38.2 % (ref 34.0–46.6)
Hemoglobin: 12.4 g/dL (ref 11.1–15.9)
MCH: 30.9 pg (ref 26.6–33.0)
MCHC: 32.5 g/dL (ref 31.5–35.7)
MCV: 95 fL (ref 79–97)
Platelets: 182 10*3/uL (ref 150–450)
RBC: 4.01 x10E6/uL (ref 3.77–5.28)
RDW: 12.5 % (ref 11.7–15.4)
WBC: 5.3 10*3/uL (ref 3.4–10.8)

## 2022-07-12 LAB — BASIC METABOLIC PANEL
BUN/Creatinine Ratio: 24 (ref 12–28)
BUN: 15 mg/dL (ref 8–27)
CO2: 27 mmol/L (ref 20–29)
Calcium: 9.5 mg/dL (ref 8.7–10.3)
Chloride: 101 mmol/L (ref 96–106)
Creatinine, Ser: 0.63 mg/dL (ref 0.57–1.00)
Glucose: 111 mg/dL — ABNORMAL HIGH (ref 70–99)
Potassium: 4.8 mmol/L (ref 3.5–5.2)
Sodium: 143 mmol/L (ref 134–144)
eGFR: 94 mL/min/{1.73_m2} (ref >59)

## 2022-07-12 LAB — PRO B NATRIURETIC PEPTIDE: NT-Pro BNP: 341 pg/mL — ABNORMAL HIGH (ref 0–301)

## 2022-07-12 NOTE — Telephone Encounter (Signed)
Patient states she will not have transportation for 11/10 heart cath. She would like a call back to reschedule.

## 2022-07-12 NOTE — Telephone Encounter (Signed)
Left message for patient to call back to review procedure instructions 

## 2022-07-12 NOTE — Telephone Encounter (Signed)
Left message for patient to call back to discuss.

## 2022-07-12 NOTE — Telephone Encounter (Signed)
I have again called patient to confirm cancelling cardiac cath scheduled tomorrow due to no transportation per pt message/request. I have been unable to speak with patient and have left several voicemail messages today asking her to call to confirm cancelling and discuss rescheduling. I have left another voicemail message letting her know that I have cancelled cath tomorrow at her request, will be glad to discuss rescheduling, asked her to call us to confirm and discuss.   I will also send patient MyChart message with this information.

## 2022-07-12 NOTE — Telephone Encounter (Signed)
Cardiac Catheterization scheduled at [facility] for: Friday July 13, 2022 9 AM Arrival time and place: [place] Entrance A at: 7 AM  Nothing to eat after midnight prior to procedure, clear liquids until 5 AM day of procedure.  Medication instructions: -Usual morning medications can be taken with sips of water including aspirin 81 mg.  Confirmed patient has responsible adult to drive home post procedure and be with patient first 24 hours after arriving home.  Patient reports no new symptoms concerning for COVID-19 in the past 10 days.  Left message for patient to call back to review procedure instructions

## 2022-07-12 NOTE — Telephone Encounter (Signed)
Voicemail message, no answer. 

## 2022-07-12 NOTE — Telephone Encounter (Signed)
I have again called patient to confirm cancelling cardiac cath scheduled tomorrow due to no transportation per pt message/request. I have been unable to speak with patient and have left several voicemail messages today asking her to call to confirm cancelling and discuss rescheduling. I have left another voicemail message letting her know that I have cancelled cath tomorrow at her request, will be glad to discuss rescheduling, asked her to call us to confirm and discuss.  I will also send patient MyChart message with this information.

## 2022-07-12 NOTE — Telephone Encounter (Signed)
Patient contacted me by phone after receiving My Chart message. Patient confirmed we have correct phone number for her-she said she was unsure why she had not received phone calls or messages at number listed.  At her request, Cardiac Cath has been rescheduled to 07/23/22 with Dr Angelena Form arrive 7 AM for 9 AM procedure-new instructions reviewed with patient and sent via [place].   Patient reports she has not started Imdur yet,but will plan to get prescription and start tomorrow. Patient will also get NTG prescription-we discussed instructions for using prn. Patient knows to seek medical care for any increasing or unrelieved cardiac symptoms.

## 2022-07-12 NOTE — Telephone Encounter (Signed)
See other phone note today

## 2022-07-12 NOTE — Telephone Encounter (Signed)
You are scheduled for a Cardiac Catheterization on Monday, November 20 with Dr. Lauree Chandler.   - Please arrive at the [facility] (Main Entrance A) at [facility]: [place], [place] [address] at 7:00 AM  (This time is two hours before your procedure to ensure your preparation). Free valet parking service is available.    - Diet: Do not eat solid foods after midnight. You may have clear liquids until 5am the day of the procedure.   - Medication instructions in preparation for your procedure:   On the morning of your procedure, take aspirin 81 mg and any of your usual morning medications. You may use sips of water.   -Plan to go home the same day-you will only stay overnight if medically necessary. -Bring a current list of your medications, ID, and current insurance cards. -You MUST have a responsible person to drive you home. -Someone MUST be with you the first 24 hours after you arrive home. -Please wear clothes that are easy to get on and off and wear slip-on shoes.   Thank you for allowing [place] to care for you!   --  Invasive Cardiovascular services

## 2022-07-12 NOTE — Telephone Encounter (Signed)
Thanks Energy manager. Do I need to change the orders that I put in yesterday? [address] [place], [place]    07/12/2022 9:39 PM

## 2022-07-13 ENCOUNTER — Encounter (HOSPITAL_COMMUNITY): Admission: RE | Payer: Self-pay | Source: Home / Self Care

## 2022-07-13 ENCOUNTER — Ambulatory Visit (HOSPITAL_COMMUNITY): Admission: RE | Admit: 2022-07-13 | Payer: Medicare HMO | Source: Home / Self Care | Admitting: Internal Medicine

## 2022-07-13 SURGERY — RIGHT/LEFT HEART CATH AND CORONARY ANGIOGRAPHY
Anesthesia: LOCAL

## 2022-07-13 NOTE — Telephone Encounter (Signed)
Scott-just need to change provider doing procedure from Dr Ali Lowe to Dr Angelena Form. Thank you. Katherine Foley.

## 2022-07-13 NOTE — Telephone Encounter (Signed)
Orders changed. Thanks! Richardson Dopp, PA-C    07/13/2022 1:31 PM

## 2022-07-22 ENCOUNTER — Other Ambulatory Visit: Payer: Self-pay | Admitting: Family Medicine

## 2022-07-23 ENCOUNTER — Encounter (HOSPITAL_COMMUNITY): Payer: Self-pay | Admitting: Cardiovascular Disease

## 2022-07-23 ENCOUNTER — Encounter (HOSPITAL_COMMUNITY)
Admission: RE | Disposition: A | Discharge status: Home / Self Care | Payer: Self-pay | Source: Home / Self Care | Attending: Cardiovascular Disease

## 2022-07-23 ENCOUNTER — Ambulatory Visit (HOSPITAL_COMMUNITY)
Admission: RE | Admit: 2022-07-23 | Discharge: 2022-07-23 | Disposition: A | Discharge status: Home / Self Care | Payer: Medicare HMO | Attending: Cardiovascular Disease | Admitting: Cardiovascular Disease

## 2022-07-23 ENCOUNTER — Other Ambulatory Visit: Payer: Self-pay

## 2022-07-23 DIAGNOSIS — J449 Chronic obstructive pulmonary disease, unspecified: Secondary | ICD-10-CM | POA: Insufficient documentation

## 2022-07-23 DIAGNOSIS — E785 Hyperlipidemia, unspecified: Secondary | ICD-10-CM | POA: Insufficient documentation

## 2022-07-23 DIAGNOSIS — Z955 Presence of coronary angioplasty implant and graft: Secondary | ICD-10-CM | POA: Insufficient documentation

## 2022-07-23 DIAGNOSIS — I25118 Atherosclerotic heart disease of native coronary artery with other forms of angina pectoris: Secondary | ICD-10-CM | POA: Diagnosis not present

## 2022-07-23 DIAGNOSIS — I252 Old myocardial infarction: Secondary | ICD-10-CM | POA: Insufficient documentation

## 2022-07-23 DIAGNOSIS — Z79899 Other long term (current) drug therapy: Secondary | ICD-10-CM | POA: Insufficient documentation

## 2022-07-23 DIAGNOSIS — Z951 Presence of aortocoronary bypass graft: Secondary | ICD-10-CM | POA: Diagnosis not present

## 2022-07-23 DIAGNOSIS — I700 Atherosclerosis of aorta: Secondary | ICD-10-CM | POA: Diagnosis not present

## 2022-07-23 DIAGNOSIS — I350 Nonrheumatic aortic (valve) stenosis: Secondary | ICD-10-CM | POA: Diagnosis not present

## 2022-07-23 DIAGNOSIS — I2510 Atherosclerotic heart disease of native coronary artery without angina pectoris: Secondary | ICD-10-CM

## 2022-07-23 DIAGNOSIS — I10 Essential (primary) hypertension: Secondary | ICD-10-CM | POA: Insufficient documentation

## 2022-07-23 DIAGNOSIS — Z87891 Personal history of nicotine dependence: Secondary | ICD-10-CM | POA: Diagnosis not present

## 2022-07-23 DIAGNOSIS — Z7982 Long term (current) use of aspirin: Secondary | ICD-10-CM | POA: Diagnosis not present

## 2022-07-23 HISTORY — PX: RIGHT HEART CATH AND CORONARY ANGIOGRAPHY: CATH118264

## 2022-07-23 LAB — POCT I-STAT EG7
Acid-Base Excess: 0 mmol/L (ref 0.0–2.0)
Bicarbonate: 25.8 mmol/L (ref 20.0–28.0)
Calcium, Ion: 1.06 mmol/L — ABNORMAL LOW (ref 1.15–1.40)
HCT: 31 % — ABNORMAL LOW (ref 36.0–46.0)
Hemoglobin: 10.5 g/dL — ABNORMAL LOW (ref 12.0–15.0)
O2 Saturation: 69 %
Potassium: 3.4 mmol/L — ABNORMAL LOW (ref 3.5–5.1)
Sodium: 142 mmol/L (ref 135–145)
TCO2: 27 mmol/L (ref 22–32)
pCO2, Ven: 45 mmHg (ref 44–60)
pH, Ven: 7.366 (ref 7.25–7.43)
pO2, Ven: 37 mmHg (ref 32–45)

## 2022-07-23 LAB — POCT I-STAT 7, (LYTES, BLD GAS, ICA,H+H)
Acid-base deficit: 3 mmol/L — ABNORMAL HIGH (ref 0.0–2.0)
Bicarbonate: 22.3 mmol/L (ref 20.0–28.0)
Calcium, Ion: 0.97 mmol/L — ABNORMAL LOW (ref 1.15–1.40)
HCT: 30 % — ABNORMAL LOW (ref 36.0–46.0)
Hemoglobin: 10.2 g/dL — ABNORMAL LOW (ref 12.0–15.0)
O2 Saturation: 92 %
Potassium: 3.2 mmol/L — ABNORMAL LOW (ref 3.5–5.1)
Sodium: 143 mmol/L (ref 135–145)
TCO2: 23 mmol/L (ref 22–32)
pCO2 arterial: 38.8 mmHg (ref 32–48)
pH, Arterial: 7.367 (ref 7.35–7.45)
pO2, Arterial: 67 mmHg — ABNORMAL LOW (ref 83–108)

## 2022-07-23 SURGERY — RIGHT HEART CATH AND CORONARY ANGIOGRAPHY
Anesthesia: LOCAL

## 2022-07-23 MED ORDER — ONDANSETRON HCL 4 MG/2ML IJ SOLN: 4.0000 mg | Freq: Four times a day (QID) | INTRAMUSCULAR | Status: DC | PRN

## 2022-07-23 MED ORDER — SODIUM CHLORIDE 0.9% FLUSH: 3.0000 mL | Freq: Two times a day (BID) | INTRAVENOUS | Status: DC

## 2022-07-23 MED ORDER — SODIUM CHLORIDE 0.9 % IV SOLN: 250.0000 mL | INTRAVENOUS | Status: DC | PRN

## 2022-07-23 MED ORDER — ASPIRIN 81 MG PO CHEW: 81.0000 mg | CHEWABLE_TABLET | ORAL | Status: DC

## 2022-07-23 MED ORDER — SODIUM CHLORIDE 0.9% FLUSH: 3.0000 mL | INTRAVENOUS | Status: DC | PRN

## 2022-07-23 MED ORDER — LIDOCAINE HCL (PF) 1 % IJ SOLN
INTRAMUSCULAR | Status: DC | PRN
  Administered 2022-07-23: 1 mL
  Administered 2022-07-23: 2 mL

## 2022-07-23 MED ORDER — SODIUM CHLORIDE 0.9 % WEIGHT BASED INFUSION
3.0000 mL/kg/h | INTRAVENOUS | Status: AC
  Administered 2022-07-23: 3 mL/kg/h via INTRAVENOUS

## 2022-07-23 MED ORDER — HYDRALAZINE HCL 20 MG/ML IJ SOLN: 10.0000 mg | INTRAMUSCULAR | Status: DC | PRN

## 2022-07-23 MED ORDER — SODIUM CHLORIDE 0.9 % WEIGHT BASED INFUSION: 1.0000 mL/kg/h | INTRAVENOUS | Status: DC

## 2022-07-23 MED ORDER — LIDOCAINE HCL (PF) 1 % IJ SOLN
INTRAMUSCULAR | Status: AC
  Filled 2022-07-23: qty 30

## 2022-07-23 MED ORDER — HEPARIN (PORCINE) IN NACL 1000-0.9 UT/500ML-% IV SOLN
INTRAVENOUS | Status: AC
  Filled 2022-07-23: qty 1000

## 2022-07-23 MED ORDER — MIDAZOLAM HCL 2 MG/2ML IJ SOLN
INTRAMUSCULAR | Status: AC
  Filled 2022-07-23: qty 2

## 2022-07-23 MED ORDER — FENTANYL CITRATE (PF) 100 MCG/2ML IJ SOLN
INTRAMUSCULAR | Status: DC | PRN
  Administered 2022-07-23: 25 ug via INTRAVENOUS

## 2022-07-23 MED ORDER — VERAPAMIL HCL 2.5 MG/ML IV SOLN
INTRAVENOUS | Status: DC | PRN
  Administered 2022-07-23: 10 mL via INTRA_ARTERIAL

## 2022-07-23 MED ORDER — MIDAZOLAM HCL 2 MG/2ML IJ SOLN
INTRAMUSCULAR | Status: DC | PRN
  Administered 2022-07-23: 1 mg via INTRAVENOUS

## 2022-07-23 MED ORDER — HEPARIN SODIUM (PORCINE) 1000 UNIT/ML IJ SOLN
INTRAMUSCULAR | Status: DC | PRN
  Administered 2022-07-23: 3000 [IU] via INTRAVENOUS

## 2022-07-23 MED ORDER — SODIUM CHLORIDE 0.9 % IV SOLN: INTRAVENOUS | Status: AC

## 2022-07-23 MED ORDER — HEPARIN SODIUM (PORCINE) 1000 UNIT/ML IJ SOLN
INTRAMUSCULAR | Status: AC
  Filled 2022-07-23: qty 10

## 2022-07-23 MED ORDER — FENTANYL CITRATE (PF) 100 MCG/2ML IJ SOLN
INTRAMUSCULAR | Status: AC
  Filled 2022-07-23: qty 2

## 2022-07-23 MED ORDER — IOHEXOL 350 MG/ML SOLN
INTRAVENOUS | Status: DC | PRN
  Administered 2022-07-23: 35 mL

## 2022-07-23 MED ORDER — HEPARIN (PORCINE) IN NACL 1000-0.9 UT/500ML-% IV SOLN
INTRAVENOUS | Status: DC | PRN
  Administered 2022-07-23 (×2): 500 mL

## 2022-07-23 MED ORDER — VERAPAMIL HCL 2.5 MG/ML IV SOLN
INTRAVENOUS | Status: AC
  Filled 2022-07-23: qty 2

## 2022-07-23 MED ORDER — LABETALOL HCL 5 MG/ML IV SOLN: 10.0000 mg | INTRAVENOUS | Status: DC | PRN

## 2022-07-23 SURGICAL SUPPLY — 12 items
CATH 5FR JL3.5 JR4 ANG PIG MP (CATHETERS) IMPLANT
CATH BALLN WEDGE 5F 110CM (CATHETERS) IMPLANT
DEVICE RAD COMP TR BAND LRG (VASCULAR PRODUCTS) IMPLANT
GLIDESHEATH SLEND SS 6F .021 (SHEATH) IMPLANT
GUIDEWIRE .025 260CM (WIRE) IMPLANT
GUIDEWIRE INQWIRE 1.5J.035X260 (WIRE) IMPLANT
INQWIRE 1.5J .035X260CM (WIRE) ×1
KIT HEART LEFT (KITS) ×2 IMPLANT
PACK CARDIAC CATHETERIZATION (CUSTOM PROCEDURE TRAY) ×2 IMPLANT
SHEATH GLIDE SLENDER 4/5FR (SHEATH) IMPLANT
TRANSDUCER W/STOPCOCK (MISCELLANEOUS) ×2 IMPLANT
TUBING CIL FLEX 10 FLL-RA (TUBING) ×2 IMPLANT

## 2022-07-23 NOTE — Interval H&P Note (Signed)
History and Physical Interval Note:  07/23/2022 7:35 AM  Katherine Foley  has presented today for surgery, with the diagnosis of aortic stenosis, chest pain.  The various methods of treatment have been discussed with the patient and family. After consideration of risks, benefits and other options for treatment, the patient has consented to  Procedure(s): RIGHT/LEFT HEART CATH AND CORONARY/GRAFT ANGIOGRAPHY (N/A) as a surgical intervention.  The patient's history has been reviewed, patient examined, no change in status, stable for surgery.  I have reviewed the patient's chart and labs.  Questions were answered to the patient's satisfaction.    Cath Lab Visit (complete for each Cath Lab visit)  Clinical Evaluation Leading to the Procedure:   ACS: No.  Non-ACS:    Anginal Classification: CCS III  Anti-ischemic medical therapy: Maximal Therapy (2 or more classes of medications)  Non-Invasive Test Results: No non-invasive testing performed  Prior CABG: Previous CABG        Lauree Chandler

## 2022-07-23 NOTE — Progress Notes (Signed)
Hematoma noted at TR band, pressure held for 53mn, hematoma resolved. Will resume air removal from TR band at 1135 will continue to monitor

## 2022-07-23 NOTE — Progress Notes (Signed)
Hematoma noted at TR band, pressure held for 93mn, hematoma resolved. Will resume air removal from TR band at 1115 will continue to monitor

## 2022-07-23 NOTE — Discharge Instructions (Signed)

## 2022-07-23 NOTE — Progress Notes (Signed)
TR BAND REMOVAL  LOCATION:    right radial  DEFLATED PER PROTOCOL:    Yes.    TIME BAND OFF / DRESSING APPLIED:    1208 gauze dressing applied   SITE UPON ARRIVAL:    Level 0  SITE AFTER BAND REMOVAL:    Level 0  CIRCULATION SENSATION AND MOVEMENT:    Within Normal Limits   Yes.    COMMENTS:   no new issues noted

## 2022-07-24 ENCOUNTER — Other Ambulatory Visit (HOSPITAL_BASED_OUTPATIENT_CLINIC_OR_DEPARTMENT_OTHER): Payer: Self-pay

## 2022-07-24 ENCOUNTER — Encounter: Payer: Self-pay | Admitting: Physician Assistant

## 2022-07-24 ENCOUNTER — Ambulatory Visit (INDEPENDENT_AMBULATORY_CARE_PROVIDER_SITE_OTHER): Payer: Medicare HMO | Admitting: Physician Assistant

## 2022-07-24 ENCOUNTER — Telehealth: Payer: Self-pay | Admitting: Family Medicine

## 2022-07-24 VITALS — BP 108/64 | HR 63 | Temp 100.0°F | Ht 62.0 in | Wt 129.5 lb

## 2022-07-24 DIAGNOSIS — J449 Chronic obstructive pulmonary disease, unspecified: Secondary | ICD-10-CM | POA: Diagnosis not present

## 2022-07-24 DIAGNOSIS — U071 COVID-19: Secondary | ICD-10-CM

## 2022-07-24 DIAGNOSIS — J020 Streptococcal pharyngitis: Secondary | ICD-10-CM | POA: Diagnosis not present

## 2022-07-24 LAB — POCT RAPID STREP A (OFFICE): Rapid Strep A Screen: POSITIVE — AB

## 2022-07-24 LAB — POC COVID19 BINAXNOW: SARS Coronavirus 2 Ag: POSITIVE — AB

## 2022-07-24 MED ORDER — AMOXICILLIN 500 MG PO CAPS: 500.0000 mg | ORAL_CAPSULE | Freq: Two times a day (BID) | ORAL | 0 refills | Status: AC

## 2022-07-24 MED ORDER — MOLNUPIRAVIR EUA 200MG CAPSULE: 4.0000 | ORAL_CAPSULE | Freq: Two times a day (BID) | ORAL | 0 refills | Status: DC

## 2022-07-24 MED ORDER — MOLNUPIRAVIR EUA 200MG CAPSULE
4.0000 | ORAL_CAPSULE | Freq: Two times a day (BID) | ORAL | 0 refills | Status: AC
  Filled 2022-07-24: qty 40, 5d supply, fill #0

## 2022-07-24 MED ORDER — PREDNISONE 20 MG PO TABS: 40.0000 mg | ORAL_TABLET | Freq: Every day | ORAL | 0 refills | Status: DC

## 2022-07-24 NOTE — Progress Notes (Signed)
Katherine Foley is a 71 y.o. female here for a new problem.  History of Present Illness:   Chief Complaint  Patient presents with   Sore Throat    Pt c/o sore throat and nasal drainage, started Saturday. Chills off and on.    HPI  Sore throat; Nasal congestion; Cough Patient is complaining of a sore throat and nasal drainage starting 3 days ago. She expresses that she experiences headaches and chills on and off. She had a cardiac catheterization yesterday and she reports that she was not tested for COVID prior to this. Her sore throat was most pronounced last night. Denies n/v/d, chest tightness, SOB.  COPD She is currently prescribed Symbicort 160 2 puffs BID, Spiriva 18 mcg daily, albuterol prn. She has not had to use the albuterol inhaler. She does not have severe SOB at this time.     Past Medical History:  Diagnosis Date   Adenomatous colon polyp    Anxiety    Anxiety/Depression 02/15/2009   Aortic stenosis s/p bioprosthetic AVR 07/07/2010   Echo 9/19:  Mild conc LVH, EF 60-65, no RWMA, Gr 1 DD, AVR ok (mean 10, peak 21), trivial MR, normal RVSF, mild TR // Echo 10/22: EF 60-65, no RWMA, normal RVSF, AVR with increased gradients (mean 24 increased from 10 in 2019) // Echocardiogram 10/23: EF 60-65, no RWMA, mildly reduced RVSF, AVR w mean gradient 17.3 mmHg   Barrett's esophagus    04/2007; 2000   Carotid stenosis 05/17/2010   a. dopplers 65/78: RICA 46-96%; LICA 2-95% (follow up due 11/12)   COPD 07/23/2007   Coronary Artery Disease 05/17/2010   NSTEMI 8/11 - BMS to RCA;  cath 4/12: dLAD 50%, RI 80% (PCI), AVCFX 30%, pRCA 30%, stent ok, 40-50, then 50% dist RCA;  c. s/p Promus DES to RI 12/25/10;  d. 04/2011 CABG x 1 (LIMA->LAD) @ time of AVR // Cath in 9/19: RI and RCA stents ok; L-LAD atretic, mild disease in mLAD >> med Rx   Family history of breast cancer    2 daughters; PALB2 mutation in family   Genetic testing 02/10/2018   PALB2 analysis at [facility] - Negative for  familial mutation   GERD    H/O hiatal hernia    Hemorrhoid thrombosis    "had it lanced" (03/25/2013)   Herpes simplex without mention of complication 28/41/3244   History of blood transfusion    'w/OHS" (03/25/2013)   Hyperlipidemia 08/29/2009   Hypertension 04/14/2007   Migraine HAs 04/20/2008   Myocardial infarction [facility]) 2011   "during catherization" (03/25/2013)   OSTEOPENIA 04/14/2007   Ovarian cyst    Parotid mass    left   Pneumonia    "multiple times" (03/25/2013)   Urine, incontinence, stress female      Social History   Tobacco Use   Smoking status: Former    Packs/day: 1.50    Years: 44.00    Total pack years: 66.00    Types: Cigarettes    Quit date: 09/03/2005    Years since quitting: 16.8   Smokeless tobacco: Never  Vaping Use   Vaping Use: Never used  Substance Use Topics   Alcohol use: Not Currently    Alcohol/week: 0.0 standard drinks of alcohol    Comment: social   Drug use: No    Past Surgical History:  Procedure Laterality Date   AORTIC VALVE REPLACEMENT  2012   APPENDECTOMY     BLEPHAROPLASTY Bilateral ~ [place]  N/A 07/06/2016   Procedure: Left Heart Cath and Cors/Grafts Angiography;  Surgeon: Sherren Mocha, MD;  Location: [place] [facility];  Service: Cardiovascular;  Laterality: N/A;   CHOLECYSTECTOMY N/A 03/25/2013   Procedure: LAPAROSCOPIC CHOLECYSTECTOMY WITH INTRAOPERATIVE CHOLANGIOGRAM;  Surgeon: Imogene Burn. Georgette Dover, MD;  Location: [place];  Service: General;  Laterality: N/A;   CORONARY ANGIOGRAPHY N/A 05/26/2018   Procedure: CORONARY ANGIOGRAPHY (CATH LAB);  Surgeon: Jettie Booze, MD;  Location: [place] [facility];  Service: Cardiovascular;  Laterality: N/A;   CORONARY ANGIOPLASTY WITH STENT PLACEMENT  2010   "I have 2 stents" (03/25/2013)   [place]; 1998   "cust in maxillary sinus; put a stent in then removed it" (03/25/2013)   ORIF ANKLE FRACTURE Right 02/14/2021   Procedure: OPEN TREATMENT OF  RIGHT TRIMALLEOLAR ANKLE FRACTURE WITHOUT POSTERIOR FIXATION, SYNDESMOSIS;  Surgeon: Erle Crocker, MD;  Location: [place];  Service: Orthopedics;  Laterality: Right;   OVARIAN CYST REMOVAL Right 2011   "size of a soccer ball" (03/25/2013)   PAROTIDECTOMY Left 04/28/2019   Procedure: LEFT TOTAL PAROTIDECTOMY;  Surgeon: Leta Baptist, MD;  Location: [place];  Service: ENT;  Laterality: Left;   RIGHT HEART CATH AND CORONARY ANGIOGRAPHY N/A 07/23/2022   Procedure: RIGHT HEART CATH AND CORONARY ANGIOGRAPHY;  Surgeon: Burnell Blanks, MD;  Location: [place] [facility];  Service: Cardiovascular;  Laterality: N/A;   thrombosed hemorrohid lanced     TONSILLECTOMY AND ADENOIDECTOMY     TOTAL ABDOMINAL HYSTERECTOMY      Family History  Problem Relation Age of Onset   Coronary artery disease Other    Heart disease Other        6 stents   Heart disease Mother    COPD Father        smoked   Heart disease Father    Rheum arthritis Father    Heart disease Maternal Uncle    Breast cancer Daughter        currently 47   Asthma Son        as a child    Breast cancer Daughter 59       currently 35; PALB2 mutation   Cancer Other        very distant paternal cousin; pancreatic ca   Cirrhosis Sister    Stroke Brother    Colon cancer Neg Hx    Colon polyps Neg Hx     Allergies  Allergen Reactions   Codeine Sulfate Itching and Rash   Hydrocodone-Acetaminophen Itching and Rash    Current Medications:   Current Outpatient Medications:    acetaminophen (TYLENOL) 650 MG CR tablet, Take 1,300 mg by mouth every 8 (eight) hours as needed for pain., Disp: , Rfl:    albuterol (VENTOLIN HFA) 108 (90 Base) MCG/ACT inhaler, Inhale 2 puffs into the lungs every 6 (six) hours as needed for wheezing or shortness of breath., Disp: 8 g, Rfl: 3   amLODipine (NORVASC) 5 MG tablet, Take 1 tablet (5 mg total) by mouth daily., Disp: 90 tablet, Rfl: 3   aspirin EC 81 MG  tablet, Take 1 tablet (81 mg total) by mouth daily., Disp:  , Rfl:    buPROPion (WELLBUTRIN XL) 300 MG 24 hr tablet, TAKE ONE TABLET BY MOUTH ONCE DAILY, Disp: 30 tablet, Rfl: 5   Calcium Carb-Cholecalciferol (CALCIUM 500/D PO), Take 2 tablets by mouth daily. Mag and zinc added, Disp: , Rfl:    cetirizine (  ZYRTEC) 10 MG tablet, Take 10 mg by mouth at bedtime., Disp: , Rfl:    desvenlafaxine (PRISTIQ) 100 MG 24 hr tablet, TAKE ONE TABLET BY MOUTH EVERY MORNING, Disp: 90 tablet, Rfl: 2   dexlansoprazole (DEXILANT) 60 MG capsule, Take 1 capsule (60 mg total) by mouth daily., Disp: 90 capsule, Rfl: 3   ezetimibe (ZETIA) 10 MG tablet, TAKE ONE TABLET BY MOUTH ONCE DAILY, Disp: 90 tablet, Rfl: 3   fluticasone (CUTIVATE) 0.05 % cream, Apply 1 Application topically 2 (two) times daily as needed (eczema)., Disp: , Rfl:    fluticasone (FLONASE) 50 MCG/ACT nasal spray, Place 2 sprays into both nostrils as needed for allergies or rhinitis., Disp: 16 g, Rfl: 3   gabapentin (NEURONTIN) 300 MG capsule, TAKE ONE CAPSULE BY MOUTH EVERYDAY AT BEDTIME, Disp: 90 capsule, Rfl: 3   isosorbide mononitrate (IMDUR) 30 MG 24 hr tablet, Take 0.5 tablets (15 mg total) by mouth daily., Disp: 45 tablet, Rfl: 3   LORazepam (ATIVAN) 0.5 MG tablet, TAKE ONE TABLET BY MOUTH twice daily AS NEEDED FOR ANXIETY. Do not drive 8 hours after using., Disp: 60 tablet, Rfl: 1   Multiple Vitamins-Minerals (MULTIVITAMIN ADULT PO), Take 1 tablet by mouth daily. , Disp: , Rfl:    nitroGLYCERIN (NITROSTAT) 0.4 MG SL tablet, Place 1 tablet (0.4 mg total) under the tongue every 5 (five) minutes as needed for chest pain (if pain does not resolve with 2 doses, call 911)., Disp: 30 tablet, Rfl: 5   propranolol (INDERAL) 20 MG tablet, Take 1 tablet (20 mg total) by mouth 2 (two) times daily., Disp: 180 tablet, Rfl: 3   rosuvastatin (CRESTOR) 40 MG tablet, Take 1 tablet (40 mg total) by mouth daily., Disp: 90 tablet, Rfl: 2   SPIRIVA HANDIHALER 18 MCG  inhalation capsule, place ONE capsule into inhaler AND inhale ONCE daily, Disp: 90 capsule, Rfl: 2   SYMBICORT 160-4.5 MCG/ACT inhaler, Inhale 2 puffs into the lungs 2 (two) times daily., Disp: 11 g, Rfl: 3   valACYclovir (VALTREX) 1000 MG tablet, Take 1,000 mg by mouth daily as needed (for fever blisters)., Disp: , Rfl:    Review of Systems:   Review of Systems  Constitutional:  Positive for chills.  HENT:  Positive for sore throat.   Neurological:  Positive for headaches.    Vitals:   Vitals:   07/24/22 1033  BP: 108/64  Pulse: 63  Temp: 100 F (37.8 C)  TempSrc: Temporal  SpO2: 94%  Weight: 129 lb 8 oz (58.7 kg)  Height: _0  (1.575 m)     Body mass index is 23.69 kg/m.  Physical Exam:   Physical Exam Vitals and nursing note reviewed.  Constitutional:      General: She is not in acute distress.    Appearance: Normal appearance. She is well-developed. She is not ill-appearing or toxic-appearing.  HENT:     Head: Normocephalic and atraumatic.     Right Ear: Tympanic membrane, ear canal and external ear normal. Tympanic membrane is not erythematous, retracted or bulging.     Left Ear: Tympanic membrane, ear canal and external ear normal. Tympanic membrane is not erythematous, retracted or bulging.     Nose: Nose normal.     Right Sinus: No maxillary sinus tenderness or frontal sinus tenderness.     Left Sinus: No maxillary sinus tenderness or frontal sinus tenderness.     Mouth/Throat:     Pharynx: Uvula midline. Posterior oropharyngeal erythema present.  Eyes:  General: Lids are normal.     Extraocular Movements: Extraocular movements intact.     Conjunctiva/sclera: Conjunctivae normal.     Pupils: Pupils are equal, round, and reactive to light.  Neck:     Trachea: Trachea normal.  Cardiovascular:     Rate and Rhythm: Normal rate and regular rhythm.     Heart sounds: Normal heart sounds, S1 normal and S2 normal. No murmur heard.    No gallop.  Pulmonary:      Effort: Pulmonary effort is normal. No respiratory distress.     Breath sounds: Normal breath sounds. No decreased breath sounds, wheezing, rhonchi or rales.  Lymphadenopathy:     Cervical: No cervical adenopathy.  Skin:    General: Skin is warm and dry.  Neurological:     Mental Status: She is alert and oriented to person, place, and time.  Psychiatric:        Speech: Speech normal.        Behavior: Behavior normal. Behavior is cooperative.        Judgment: Judgment normal.     Assessment and Plan:   Strep pharyngitis Strep positive Start amoxicillin 500 mg BID x 10 days Push fluids and rest Follow-up if new/worsening/lack of improvement  COVID-19 COVID positive Has had covid vaccines; this is her first dx of COVID Due to medication interactions, will prescribe molnupiravir for her symptoms Will also start oral steroids given COPD diagnosis Strict precautions to call us if feeling any worse or new sx Recommend close monitoring of pulse ox-- guidelines given on AVS  Chronic obstructive pulmonary disease, unspecified COPD type ([place]) Overall controlled Continue inhalers Start oral prednisone as prescribed  I,Verona Buck,acting as a scribe for [employer], PA.,have documented all relevant documentation on the behalf of Inda Coke, PA,as directed by  Inda Coke, PA while in the presence of Inda Coke, [place].  I, Inda Coke, [place], have reviewed all documentation for this visit. The documentation on 07/24/22 for the exam, diagnosis, procedures, and orders are all accurate and complete.  Inda Coke, PA-C

## 2022-07-24 NOTE — Telephone Encounter (Signed)
Spoke to pt told her the [facility] on [address] has the medication Molnupiravir and I have sent Rx there. Pt verbalized understanding.

## 2022-07-24 NOTE — Patient Instructions (Signed)
It was great to see you!  You have strep throat and COVID  Start molnupiravir for your COVID Start prednisone for your COPD Start amoxicillin for your sore throat  For current/suspected COVID symptoms: - Please watch closely for new onset shortness of breath, worsening shortness of breath, dizziness, confusion or any worsening symptoms. If any of these occur, please contact us during business hours, and if after business hours, please seek urgent care or go to the closest emergency room.  -Consider purchasing a pulse oximeter. If your levels are 94% or below persistently, please seek care at the hospital.   -If you test positive for COVID, everyone, regardless of vaccination status, should stay home for 5 days since symptom onset (or if asymptomatic, on day of positive test.) If you have no symptoms or your symptoms are resolving after 5 days, you can leave your house. Continue to wear a mask around others for 5 additional days. If you have a fever, continue to stay home until your fever resolves without use of medication.  -Please inform any contacts of your positive result so they can appropriately quarantine/test.  -Push fluids and try to eat small, frequent meals with protein to maintain your stamina.   Take care,  Inda Coke PA-C

## 2022-07-24 NOTE — Telephone Encounter (Signed)
Pt States: -seen today 07/24/22 by [facility]. -pharmacy does not have prescription. -Because of laryngitis can't call around to find a pharmacy that has supply.  Pt requests: -alternative prescription sent to pharmacy  molnupiravir EUA (LAGEVRIO) 200 mg CAPS capsule [[id number]]   [place], [place] [address] [place] [address] Phone: [phone]  Fax: [phone]

## 2022-07-30 NOTE — Progress Notes (Unsigned)
Cardiology Office Note:    Date:  07/31/2022   ID:  Katherine Foley, DOB May 04, 1951, MRN [id number]  PCP:  Marin Olp, [place] Providers Cardiologist:  Sherren Mocha, MD Cardiology APP:  Sharmon Revere    Referring MD: Marin Olp, MD   Chief Complaint:  Hospitalization Follow-up (F/u after cardiac catheterization )    Patient Profile: Coronary artery disease  NSTEMI in 2011 >> BMS to RCA  S/p DES to RI in 2012 S/p CABG in 2012 (L-LAD) + AVR Cath in 9/19: RI and RCA stents ok; L-LAD atretic, mild disease in mLAD >> med Rx [facility] 07/23/2022: mLAD 30; RI stent patent with 30 ISR; mLCx 25; pRCA 30, mRCA stent patent with 30 ISR, dRCA 40-med Rx Aortic valve Dz s/p bioprosthetic AVR in 2012 Echo 10/22: EF 60-65, AVR gradient 24 mmHg (? from 10 mmHg in 2019) Echo 06/11/2022: EF 60-65, no RWMA, mild reduced RVSF, s/p AVR with normal function (mean gradient 17.3 mmHg), RAP 3 Asthma/COPD  Hypertension  Hyperlipidemia  L parotidectomy (path negative) Carotid artery disease [place] 8/21: Bilateral ICA 1-39; R subclavian stenosis  Cardiac Studies & Procedures   CARDIAC CATHETERIZATION  CARDIAC CATHETERIZATION 07/23/2022  Narrative   Mid LAD lesion is 30% stenosed.   Mid Cx lesion is 25% stenosed.   Prox RCA to Mid RCA lesion is 30% stenosed.   Prox RCA lesion is 30% stenosed.   Dist RCA lesion is 40% stenosed.   Ost Ramus to Ramus lesion is 30% stenosed.  Mild plaque in the ostium of the left main Mild plaque in the mid LAD Mild plaque in the mid Circumflex Patent intermediate branch stent with minimal restenosis Large dominant RCA with patent mid vessel stent with minimal restenosis. Normal right heart pressures  Recommendations: Continue medical management of CAD.  Findings Coronary Findings Diagnostic  Dominance: Right  Left Anterior Descending Mid LAD lesion is 30% stenosed.  Ramus Intermedius Vessel is large. Ost Ramus to Ramus  lesion is 30% stenosed. The lesion was previously treated using a stent (unknown type) over 2 years ago.  Left Circumflex Mid Cx lesion is 25% stenosed.  Right Coronary Artery Prox RCA lesion is 30% stenosed. Prox RCA to Mid RCA lesion is 30% stenosed. The lesion was previously treated using a stent (unknown type) over 2 years ago. Dist RCA lesion is 40% stenosed.  Intervention  No interventions have been documented.   CARDIAC CATHETERIZATION  CARDIAC CATHETERIZATION 05/26/2018  Narrative  Prox RCA to Mid RCA lesion is 25% stenosed.  Ost RCA to Prox RCA lesion is 30% stenosed.  Dist RCA lesion is 25% stenosed.  Previously placed Ramus stent (unknown type) is widely patent.  Mid Cx lesion is 25% stenosed.  Mid LAD lesion is 30% stenosed.  Patent stents.  No significnat change from 2017 angiogram.  Continue aggressive secondary prevention.  Findings Coronary Findings Diagnostic  Dominance: Right  Left Anterior Descending Mid LAD lesion is 30% stenosed.  Ramus Intermedius Vessel is large. Previously placed Ramus stent (unknown type) is widely patent.  Left Circumflex Mid Cx lesion is 25% stenosed.  Right Coronary Artery Ost RCA to Prox RCA lesion is 30% stenosed. Prox RCA to Mid RCA lesion is 25% stenosed. The lesion was previously treated. Dist RCA lesion is 25% stenosed.  Intervention  No interventions have been documented.   STRESS TESTS  MYOCARDIAL PERFUSION IMAGING 12/02/2015  Narrative  The left ventricular ejection fraction is hyperdynamic (>65%).  Nuclear stress EF: 70%.  There was no ST segment deviation noted during stress.  The study is normal.  This is a low risk study.  Normal pharmacologic nuclear stress test with no evidence of prior infarct or ischemia.   ECHOCARDIOGRAM  ECHOCARDIOGRAM COMPLETE 06/11/2022  Narrative ECHOCARDIOGRAM REPORT    [name] Date of Exam: 06/11/2022 Medical Rec #:   [id number]            Height:       62.5 in Accession #:    [id number]           Weight:       126.4 lb Date of Birth:  02-Jun-1951             BSA:          1.582 m Patient Age:    71 years             BP:           120/82 mmHg Patient Gender: F                    HR:           66 bpm. Exam Location:  [place]  Procedure: 2D Echo, Cardiac Doppler and Color Doppler  Indications:    Z95.2 Aortic valve replacement  History:        Patient has prior history of Echocardiogram examinations, most recent 06/12/2021. CAD and Previous Myocardial Infarction, Prior CABG, COPD; Risk Factors:Hypertension, Dyslipidemia and Former Smoker. Aortic stenosis. Aortic atherosclerosis. Aortic Valve: bioprosthetic valve is present in the aortic position.  Sonographer:    Diamond Nickel RCS Referring Phys: [place]   1. Left ventricular ejection fraction, by estimation, is 60 to 65%. The left ventricle has normal function. The left ventricle has no regional wall motion abnormalities. Left ventricular diastolic parameters are indeterminate. 2. Right ventricular systolic function is mildly reduced. The right ventricular size is normal. 3. The mitral valve is normal in structure. No evidence of mitral valve regurgitation. No evidence of mitral stenosis. 4. The aortic valve has been repaired/replaced. Aortic valve regurgitation is not visualized. No aortic stenosis is present. There is a bioprosthetic valve present in the aortic position. 5. The inferior vena cava is normal in size with greater than 50% respiratory variability, suggesting right atrial pressure of 3 mmHg.  FINDINGS Left Ventricle: Left ventricular ejection fraction, by estimation, is 60 to 65%. The left ventricle has normal function. The left ventricle has no regional wall motion abnormalities. The left ventricular internal cavity size was normal in size. There is no left ventricular hypertrophy. Left ventricular  diastolic parameters are indeterminate.  Right Ventricle: The right ventricular size is normal. No increase in right ventricular wall thickness. Right ventricular systolic function is mildly reduced.  Left Atrium: Left atrial size was normal in size.  Right Atrium: Right atrial size was normal in size.  Pericardium: There is no evidence of pericardial effusion.  Mitral Valve: The mitral valve is normal in structure. No evidence of mitral valve regurgitation. No evidence of mitral valve stenosis.  Tricuspid Valve: The tricuspid valve is normal in structure. Tricuspid valve regurgitation is not demonstrated. No evidence of tricuspid stenosis.  Aortic Valve: The aortic valve has been repaired/replaced. Aortic valve regurgitation is not visualized. No aortic stenosis is present. Aortic valve mean gradient measures 17.3 mmHg. Aortic valve peak gradient measures 32.0 mmHg. There is a bioprosthetic  valve present in the aortic position.  Pulmonic Valve: The pulmonic valve was normal in structure. Pulmonic valve regurgitation is not visualized. No evidence of pulmonic stenosis.  Aorta: The aortic root is normal in size and structure.  Venous: The inferior vena cava is normal in size with greater than 50% respiratory variability, suggesting right atrial pressure of 3 mmHg.  IAS/Shunts: No atrial level shunt detected by color flow Doppler.   LEFT VENTRICLE PLAX 2D LVIDd:         4.60 cm Diastology LVIDs:         2.40 cm LV e' medial:    7.83 cm/s LV PW:         0.90 cm LV E/e' medial:  11.1 LV IVS:        0.90 cm LV e' lateral:   7.28 cm/s LV E/e' lateral: 12.0   RIGHT VENTRICLE RV Basal diam:  2.70 cm RV S prime:     7.65 cm/s TAPSE (M-mode): 1.4 cm  LEFT ATRIUM             Index        RIGHT ATRIUM          Index LA diam:        3.70 cm 2.34 cm/m   RA Area:     9.46 cm LA Vol (A2C):   35.0 ml 22.12 ml/m  RA Volume:   16.80 ml 10.62 ml/m LA Vol (A4C):   28.9 ml 18.26 ml/m LA  Biplane Vol: 32.0 ml 20.22 ml/m AORTIC VALVE AV Vmax:           282.67 cm/s AV Vmean:          191.667 cm/s AV VTI:            0.681 m AV Peak Grad:      32.0 mmHg AV Mean Grad:      17.3 mmHg LVOT Vmax:         126.00 cm/s LVOT Vmean:        84.300 cm/s LVOT VTI:          0.295 m LVOT/AV VTI ratio: 0.43  MITRAL VALVE MV Area (PHT): 2.83 cm    SHUNTS MV Decel Time: 268 msec    Systemic VTI: 0.30 m MV E velocity: 87.00 cm/s MV A velocity: 88.00 cm/s MV E/A ratio:  0.99  Kardie Tobb DO Electronically signed by Berniece Salines DO Signature Date/Time: 06/11/2022/6:24:12 PM    Final              History of Present Illness:   Nihira Puello is a 71 y.o. female with the above problem list.  She was last seen 07/11/22. She noted symptoms of exertional chest pain and shortness of breath.  Cardiac catheterization was arranged and demonstrated patent stents in the RI and RCA and mild non-obs CAD elsewhere. Med Rx was recommended. She was dx with COVID-19 and Strep throat 07/24/22 and tx with Molnupiravir, Amoxicillin and Prednisone.  She returns for f/u. She is here alone. She notes significant fatigue since being dx with COVID. She also notes her breathing is worse. Her cough is improved. She has not had significant sputum production. She continues to have chest pain from time to time. She thinks it is due to stress. She has tried antacids in the past without much improvement.      Past Medical History:  Diagnosis Date   Adenomatous colon polyp    Anxiety    Anxiety/Depression  02/15/2009   Aortic stenosis s/p bioprosthetic AVR 07/07/2010   Echo 9/19:  Mild conc LVH, EF 60-65, no RWMA, Gr 1 DD, AVR ok (mean 10, peak 21), trivial MR, normal RVSF, mild TR // Echo 10/22: EF 60-65, no RWMA, normal RVSF, AVR with increased gradients (mean 24 increased from 10 in 2019) // Echocardiogram 10/23: EF 60-65, no RWMA, mildly reduced RVSF, AVR w mean gradient 17.3 mmHg   Barrett's esophagus     04/2007; 2000   Carotid stenosis 05/17/2010   a. dopplers 29/47: RICA 65-46%; LICA 5-03% (follow up due 11/12)   COPD 07/23/2007   Coronary Artery Disease 05/17/2010   NSTEMI 8/11 - BMS to RCA;  cath 4/12: dLAD 50%, RI 80% (PCI), AVCFX 30%, pRCA 30%, stent ok, 40-50, then 50% dist RCA;  c. s/p Promus DES to RI 12/25/10;  d. 04/2011 CABG x 1 (LIMA->LAD) @ time of AVR // Cath in 9/19: RI and RCA stents ok; L-LAD atretic, mild disease in mLAD >> med Rx   Family history of breast cancer    2 daughters; PALB2 mutation in family   Genetic testing 02/10/2018   PALB2 analysis at [facility] - Negative for familial mutation   GERD    H/O hiatal hernia    Hemorrhoid thrombosis    "had it lanced" (03/25/2013)   Herpes simplex without mention of complication 54/65/6812   History of blood transfusion    'w/OHS" (03/25/2013)   Hyperlipidemia 08/29/2009   Hypertension 04/14/2007   Migraine HAs 04/20/2008   Myocardial infarction [facility]) 2011   "during catherization" (03/25/2013)   OSTEOPENIA 04/14/2007   Ovarian cyst    Parotid mass    left   Pneumonia    "multiple times" (03/25/2013)   Urine, incontinence, stress female    Current Medications: Current Meds  Medication Sig   acetaminophen (TYLENOL) 650 MG CR tablet Take 1,300 mg by mouth every 8 (eight) hours as needed for pain.   albuterol (VENTOLIN HFA) 108 (90 Base) MCG/ACT inhaler Inhale 2 puffs into the lungs every 6 (six) hours as needed for wheezing or shortness of breath.   amLODipine (NORVASC) 5 MG tablet Take 1 tablet (5 mg total) by mouth daily.   amoxicillin (AMOXIL) 500 MG capsule Take 1 capsule (500 mg total) by mouth 2 (two) times daily for 10 days.   aspirin EC 81 MG tablet Take 1 tablet (81 mg total) by mouth daily.   buPROPion (WELLBUTRIN XL) 300 MG 24 hr tablet TAKE ONE TABLET BY MOUTH ONCE DAILY   Calcium Carb-Cholecalciferol (CALCIUM 500/D PO) Take 2 tablets by mouth daily. Mag and zinc added   cetirizine (ZYRTEC) 10 MG tablet Take 10  mg by mouth at bedtime.   desvenlafaxine (PRISTIQ) 100 MG 24 hr tablet TAKE ONE TABLET BY MOUTH EVERY MORNING   dexlansoprazole (DEXILANT) 60 MG capsule Take 1 capsule (60 mg total) by mouth daily.   ezetimibe (ZETIA) 10 MG tablet TAKE ONE TABLET BY MOUTH ONCE DAILY   fluticasone (CUTIVATE) 0.05 % cream Apply 1 Application topically 2 (two) times daily as needed (eczema).   fluticasone (FLONASE) 50 MCG/ACT nasal spray Place 2 sprays into both nostrils as needed for allergies or rhinitis.   gabapentin (NEURONTIN) 300 MG capsule TAKE ONE CAPSULE BY MOUTH EVERYDAY AT BEDTIME   isosorbide mononitrate (IMDUR) 30 MG 24 hr tablet Take 0.5 tablets (15 mg total) by mouth daily.   LORazepam (ATIVAN) 0.5 MG tablet TAKE ONE TABLET BY MOUTH twice daily AS NEEDED FOR  ANXIETY. Do not drive 8 hours after using.   Multiple Vitamins-Minerals (MULTIVITAMIN ADULT PO) Take 1 tablet by mouth daily.    nitroGLYCERIN (NITROSTAT) 0.4 MG SL tablet Place 1 tablet (0.4 mg total) under the tongue every 5 (five) minutes as needed for chest pain (if pain does not resolve with 2 doses, call 911).   propranolol (INDERAL) 20 MG tablet Take 1 tablet (20 mg total) by mouth 2 (two) times daily.   rosuvastatin (CRESTOR) 40 MG tablet Take 1 tablet (40 mg total) by mouth daily.   SPIRIVA HANDIHALER 18 MCG inhalation capsule place ONE capsule into inhaler AND inhale ONCE daily   SYMBICORT 160-4.5 MCG/ACT inhaler Inhale 2 puffs into the lungs 2 (two) times daily.   valACYclovir (VALTREX) 1000 MG tablet Take 1,000 mg by mouth daily as needed (for fever blisters).    Allergies:   Codeine sulfate and Hydrocodone-acetaminophen   Social History   Occupational History   Occupation: Glass blower/designer: [employer]   Occupation: retired  Tobacco Use   Smoking status: Former    Packs/day: 1.50    Years: 44.00    Total pack years: 66.00    Types: Cigarettes    Quit date: 09/03/2005    Years since quitting: 16.9   Smokeless tobacco: Never   Vaping Use   Vaping Use: Never used  Substance and Sexual Activity   Alcohol use: Not Currently    Alcohol/week: 0.0 standard drinks of alcohol    Comment: social   Drug use: No   Sexual activity: Not Currently    Birth control/protection: Post-menopausal, Surgical    Family Hx: The patient's family history includes Asthma in her son; Breast cancer in her daughter; Breast cancer (age of onset: 35) in her daughter; COPD in her father; Cancer in an other family member; Cirrhosis in her sister; Coronary artery disease in an other family member; Heart disease in her father, maternal uncle, mother, and another family member; Rheum arthritis in her father; Stroke in her brother. There is no history of Colon cancer or Colon polyps.  Review of Systems  Constitutional: Negative for fever.  Respiratory:  Negative for sputum production.      EKGs/Labs/Other Test Reviewed:    EKG:  EKG is not ordered today.     Recent Labs: 03/05/2022: ALT 32 07/11/2022: BUN 15; Creatinine, Ser 0.63; NT-Pro BNP 341; Platelets 182 07/23/2022: Hemoglobin 10.5; Potassium 3.4; Sodium 142   Recent Lipid Panel Recent Labs    03/05/22 0912  CHOL 112  TRIG 86.0  HDL 51.20  VLDL 17.2  LDLCALC 44      Risk Assessment/Calculations/Metrics:              Physical Exam:    VS:  BP 110/60   Pulse 62   Ht 5' 2" (1.575 m)   Wt 133 lb (60.3 kg)   SpO2 91%   BMI 24.33 kg/m     Wt Readings from Last 3 Encounters:  07/31/22 133 lb (60.3 kg)  07/24/22 129 lb 8 oz (58.7 kg)  07/23/22 121 lb (54.9 kg)    Constitutional:      Appearance: Healthy appearance. Not in distress.  Neck:     Vascular: No JVR. JVD normal.  Pulmonary:     Breath sounds: No wheezing. No rales.  Cardiovascular:     Normal rate. Regular rhythm. Normal S1. Normal S2.      Murmurs: There is a grade 2/6 systolic murmur at the URSB.  Comments: R wrist with ecchymosis but no hematoma Edema:    Peripheral edema absent.  Abdominal:      Palpations: Abdomen is soft.  Skin:    General: Skin is warm and dry.  Neurological:     Mental Status: Alert and oriented to person, place and time.         ASSESSMENT & PLAN:   Coronary artery disease involving native coronary artery of native heart without angina pectoris History of non-STEMI in 2011 treated with a BMS to the RCA and subsequent DES to the RI in 2012.  She underwent CABG with LIMA-LAD in 2012 at the time of her aortic valve replacement.  She was recently seen with chest discomfort concerning for angina and set up for cardiac catheterization.  This demonstrated patent stent in the RI and patent stent in the RCA.  There was mild nonobstructive disease elsewhere.  Her LIMA-LAD is known to be atretic from prior cardiac catheterizations.  Medical therapy has been recommended.  A recent echocardiogram demonstrated a normally functioning bioprosthetic AVR.  A recent BNP was normal.  She continues to have chest symptoms and shortness of breath.  Her breathing has been somewhat worsened by COVID-19.  Her symptoms seem to be noncardiac.  At this time, I do not think she needs any further adjustments in her antianginals. Continue amlodipine 5 mg daily, aspirin 81 mg daily, Zetia 10 mg daily, Imdur 15 mg daily, propranolol 20 mg twice daily, Crestor 40 mg daily. Follow-up 6 months  Precordial chest pain As noted, she continues to have chest discomfort.  She does relate her symptoms to stress.  This is certainly a possible etiology.  Other etiologies include COPD/asthma as well as acid reflux.  I have asked her to follow-up with her pulmonologist initially.  If she is stable from a pulmonary standpoint, she may need referral back to gastroenterology.  Essential hypertension The patient's blood pressure is controlled on her current regimen.  Continue current therapy.   Aortic stenosis S/p AVR in 2012. Recent echocardiogram in October 2023 with stable AVR. Continue SBE prophylaxis.    Aortic atherosclerosis (HCC) Continue aspirin, statin therapy.  Chronic obstructive pulmonary disease ([place]) Follow-up with pulmonology as outlined.  COVID-19 She is fatigued as well as short of breath since being diagnosed with COVID-19 and strep throat.  She continues on amoxicillin.  She has finished molnupiravir.  She has a f/u appt 12/8. I have asked her to follow-up with primary care's sooner if her symptoms should continue or worsen.  Hypokalemia K+ was low at the time of her cath. Repeat BMET today.   Anemia Hgb was normal on precath labs and was 10.5 at the time of her cath. Will repeat CBC today.              Dispo:  Return in about 6 months (around 01/29/2023) for Routine Follow Up, w/ Dr. Burt Knack, or Richardson Dopp, PA-C.   Medication Adjustments/Labs and Tests Ordered: Current medicines are reviewed at length with the patient today.  Concerns regarding medicines are outlined above.  Tests Ordered: Orders Placed This Encounter  Procedures   Basic metabolic panel   CBC   Medication Changes: No orders of the defined types were placed in this encounter.  Signed, Richardson Dopp, PA-C  07/31/2022 12:07 PM    [address], [place], [place]  [address] Phone: [phone]; Fax: [phone]

## 2022-07-31 ENCOUNTER — Encounter: Payer: Self-pay | Admitting: Physician Assistant

## 2022-07-31 ENCOUNTER — Ambulatory Visit: Payer: Medicare HMO | Attending: Physician Assistant | Admitting: Physician Assistant

## 2022-07-31 VITALS — BP 110/60 | HR 62 | Ht 62.0 in | Wt 133.0 lb

## 2022-07-31 DIAGNOSIS — I10 Essential (primary) hypertension: Secondary | ICD-10-CM | POA: Diagnosis not present

## 2022-07-31 DIAGNOSIS — U071 COVID-19: Secondary | ICD-10-CM

## 2022-07-31 DIAGNOSIS — R072 Precordial pain: Secondary | ICD-10-CM

## 2022-07-31 DIAGNOSIS — I2510 Atherosclerotic heart disease of native coronary artery without angina pectoris: Secondary | ICD-10-CM | POA: Diagnosis not present

## 2022-07-31 DIAGNOSIS — D649 Anemia, unspecified: Secondary | ICD-10-CM | POA: Diagnosis not present

## 2022-07-31 DIAGNOSIS — I700 Atherosclerosis of aorta: Secondary | ICD-10-CM

## 2022-07-31 DIAGNOSIS — I350 Nonrheumatic aortic (valve) stenosis: Secondary | ICD-10-CM | POA: Diagnosis not present

## 2022-07-31 DIAGNOSIS — J449 Chronic obstructive pulmonary disease, unspecified: Secondary | ICD-10-CM

## 2022-07-31 DIAGNOSIS — E876 Hypokalemia: Secondary | ICD-10-CM

## 2022-07-31 NOTE — Assessment & Plan Note (Signed)
As noted, she continues to have chest discomfort.  She does relate her symptoms to stress.  This is certainly a possible etiology.  Other etiologies include COPD/asthma as well as acid reflux.  I have asked her to follow-up with her pulmonologist initially.  If she is stable from a pulmonary standpoint, she may need referral back to gastroenterology.

## 2022-07-31 NOTE — Patient Instructions (Addendum)
Medication Instructions:  Your physician recommends that you continue on your current medications as directed. Please refer to the Current Medication list given to you today.  *If you need a refill on your cardiac medications before your next appointment, please call your pharmacy*   Lab Work: TODAY:  BMET & CBC  If you have labs (blood work) drawn today and your tests are completely normal, you will receive your results only by: [place] (if you have MyChart) OR A paper copy in the mail If you have any lab test that is abnormal or we need to change your treatment, we will call you to review the results.   Testing/Procedures: None ordered   Follow-Up: At [facility], you and your health needs are our priority.  As part of our continuing mission to provide you with exceptional heart care, we have created designated Provider Care Teams.  These Care Teams include your primary Cardiologist (physician) and Advanced Practice Providers (APPs -  Physician Assistants and Nurse Practitioners) who all work together to provide you with the care you need, when you need it.  We recommend signing up for the patient portal called "MyChart".  Sign up information is provided on this After Visit Summary.  MyChart is used to connect with patients for Virtual Visits (Telemedicine).  Patients are able to view lab/test results, encounter notes, upcoming appointments, etc.  Non-urgent messages can be sent to your provider as well.   To learn more about what you can do with MyChart, go to [email or web address].    Your next appointment:   6 month(s)   01/25/23 ARRIVE AT 9:00   The format for your next appointment:   In Person  Provider:   Sherren Mocha, MD  or Richardson Dopp, PA-C         Other Instructions You should follow-up with your primary care dr for covid & pulmonary for copd/asthma  Important Information About Sugar

## 2022-07-31 NOTE — Assessment & Plan Note (Signed)
History of non-STEMI in 2011 treated with a BMS to the RCA and subsequent DES to the RI in 2012.  She underwent CABG with LIMA-LAD in 2012 at the time of her aortic valve replacement.  She was recently seen with chest discomfort concerning for angina and set up for cardiac catheterization.  This demonstrated patent stent in the RI and patent stent in the RCA.  There was mild nonobstructive disease elsewhere.  Her LIMA-LAD is known to be atretic from prior cardiac catheterizations.  Medical therapy has been recommended.  A recent echocardiogram demonstrated a normally functioning bioprosthetic AVR.  A recent BNP was normal.  She continues to have chest symptoms and shortness of breath.  Her breathing has been somewhat worsened by COVID-19.  Her symptoms seem to be noncardiac.  At this time, I do not think she needs any further adjustments in her antianginals. Continue amlodipine 5 mg daily, aspirin 81 mg daily, Zetia 10 mg daily, Imdur 15 mg daily, propranolol 20 mg twice daily, Crestor 40 mg daily. Follow-up 6 months

## 2022-07-31 NOTE — Assessment & Plan Note (Signed)
Hgb was normal on precath labs and was 10.5 at the time of her cath. Will repeat CBC today.

## 2022-07-31 NOTE — Assessment & Plan Note (Signed)
S/p AVR in 2012. Recent echocardiogram in October 2023 with stable AVR. Continue SBE prophylaxis.

## 2022-07-31 NOTE — Assessment & Plan Note (Signed)
K+ was low at the time of her cath. Repeat BMET today.

## 2022-07-31 NOTE — Assessment & Plan Note (Signed)
Follow-up with pulmonology as outlined.

## 2022-07-31 NOTE — Assessment & Plan Note (Signed)
The patient's blood pressure is controlled on her current regimen.  Continue current therapy.   

## 2022-07-31 NOTE — Assessment & Plan Note (Signed)
She is fatigued as well as short of breath since being diagnosed with COVID-19 and strep throat.  She continues on amoxicillin.  She has finished molnupiravir.  She has a f/u appt 12/8. I have asked her to follow-up with primary care's sooner if her symptoms should continue or worsen.

## 2022-07-31 NOTE — Assessment & Plan Note (Signed)
Continue aspirin, statin therapy 

## 2022-08-01 LAB — BASIC METABOLIC PANEL
BUN/Creatinine Ratio: 15 (ref 12–28)
BUN: 11 mg/dL (ref 8–27)
CO2: 25 mmol/L (ref 20–29)
Calcium: 8.7 mg/dL (ref 8.7–10.3)
Chloride: 97 mmol/L (ref 96–106)
Creatinine, Ser: 0.71 mg/dL (ref 0.57–1.00)
Glucose: 103 mg/dL — ABNORMAL HIGH (ref 70–99)
Potassium: 4.6 mmol/L (ref 3.5–5.2)
Sodium: 137 mmol/L (ref 134–144)
eGFR: 90 mL/min/{1.73_m2} (ref >59)

## 2022-08-01 LAB — CBC
Hematocrit: 36.6 % (ref 34.0–46.6)
Hemoglobin: 11.9 g/dL (ref 11.1–15.9)
MCH: 31 pg (ref 26.6–33.0)
MCHC: 32.5 g/dL (ref 31.5–35.7)
MCV: 95 fL (ref 79–97)
Platelets: 223 10*3/uL (ref 150–450)
RBC: 3.84 x10E6/uL (ref 3.77–5.28)
RDW: 12.7 % (ref 11.7–15.4)
WBC: 9.9 10*3/uL (ref 3.4–10.8)

## 2022-08-07 ENCOUNTER — Other Ambulatory Visit: Payer: Self-pay | Admitting: Physician Assistant

## 2022-08-07 ENCOUNTER — Other Ambulatory Visit: Payer: Self-pay | Admitting: Family Medicine

## 2022-08-08 ENCOUNTER — Telehealth: Payer: Self-pay | Admitting: Pharmacist

## 2022-08-08 NOTE — Progress Notes (Unsigned)
Chronic Care Management Pharmacy Assistant   Name: Katherine Foley  MRN: [id number] DOB: 07-28-1951   Reason for Encounter: Medication Coordination Call    Recent office visits:  07/24/2022 OV (Fam Med) Inda Coke, [place]; Start amoxicillin 500 mg BID x 10 days , Start oral prednisone as prescribed   Recent consult visits:  07/31/2022 OV (Cardiology) Richardson Dopp T, PA-C; no medication changes indicated.  07/11/2022 OV (Cardiology) Richardson Dopp T, PA-C; no medication changes indicated.  Hospital visits:  07/23/2022 Admission for Right Heart Cath  Medications: Outpatient Encounter Medications as of 08/08/2022  Medication Sig   acetaminophen (TYLENOL) 650 MG CR tablet Take 1,300 mg by mouth every 8 (eight) hours as needed for pain.   albuterol (VENTOLIN HFA) 108 (90 Base) MCG/ACT inhaler Inhale 2 puffs into the lungs every 6 (six) hours as needed for wheezing or shortness of breath.   amLODipine (NORVASC) 5 MG tablet TAKE ONE TABLET BY MOUTH ONCE DAILY   aspirin EC 81 MG tablet Take 1 tablet (81 mg total) by mouth daily.   buPROPion (WELLBUTRIN XL) 300 MG 24 hr tablet TAKE ONE TABLET BY MOUTH ONCE DAILY   Calcium Carb-Cholecalciferol (CALCIUM 500/D PO) Take 2 tablets by mouth daily. Mag and zinc added   cetirizine (ZYRTEC) 10 MG tablet Take 10 mg by mouth at bedtime.   desvenlafaxine (PRISTIQ) 100 MG 24 hr tablet TAKE ONE TABLET BY MOUTH EVERY MORNING   dexlansoprazole (DEXILANT) 60 MG capsule TAKE ONE CAPSULE BY MOUTH ONCE DAILY   ezetimibe (ZETIA) 10 MG tablet TAKE ONE TABLET BY MOUTH ONCE DAILY   fluticasone (CUTIVATE) 0.05 % cream Apply 1 Application topically 2 (two) times daily as needed (eczema).   fluticasone (FLONASE) 50 MCG/ACT nasal spray Place 2 sprays into both nostrils as needed for allergies or rhinitis.   gabapentin (NEURONTIN) 300 MG capsule TAKE ONE CAPSULE BY MOUTH EVERYDAY AT BEDTIME   isosorbide mononitrate (IMDUR) 30 MG 24 hr tablet Take 0.5 tablets  (15 mg total) by mouth daily.   LORazepam (ATIVAN) 0.5 MG tablet TAKE ONE TABLET BY MOUTH twice daily AS NEEDED FOR ANXIETY. Do not drive 8 hours after using.   Multiple Vitamins-Minerals (MULTIVITAMIN ADULT PO) Take 1 tablet by mouth daily.    nitroGLYCERIN (NITROSTAT) 0.4 MG SL tablet Place 1 tablet (0.4 mg total) under the tongue every 5 (five) minutes as needed for chest pain (if pain does not resolve with 2 doses, call 911).   propranolol (INDERAL) 20 MG tablet Take 1 tablet (20 mg total) by mouth 2 (two) times daily.   rosuvastatin (CRESTOR) 40 MG tablet Take 1 tablet (40 mg total) by mouth daily.   SPIRIVA HANDIHALER 18 MCG inhalation capsule place ONE capsule into inhaler AND inhale ONCE daily   SYMBICORT 160-4.5 MCG/ACT inhaler Inhale 2 puffs into the lungs 2 (two) times daily.   valACYclovir (VALTREX) 1000 MG tablet Take 1,000 mg by mouth daily as needed (for fever blisters).   [DISCONTINUED] Aclidinium Bromide (TUDORZA PRESSAIR) 400 MCG/ACT AEPB Inhale 1 puff into the lungs 2 (two) times daily.   No facility-administered encounter medications on file as of 08/08/2022.   Reviewed chart for medication changes ahead of medication coordination call.  No OVs, Consults, or hospital visits since last care coordination call/Pharmacist visit.   No medication changes indicated.  BP Readings from Last 3 Encounters:  07/31/22 110/60  07/24/22 108/64  07/23/22 136/64    Lab Results  Component Value Date   HGBA1C 6.2 (  H) 04/27/2011     Patient obtains medications through Vials  90 Days   Last adherence delivery included:  Gabapentin 300 mg Take one capsule by mouth everyday at bedtime Ezetimibe 10 mg Tab Take one tab by mouth once daily Dexlansoprazole 60 mg Cap Take one capsule once daily Desvenlafaxine 100 mg-Take one tab by mouth once daily Amlodipine 5 mg once daily Propranolol 20 mg tab Take one tab by mouth twice daily  Rosuvastatin 40 mg- Take one tab by mouth once  daily Bupropion HCL XL 300 mg - once daily Lorazepam 0.5 mg twice daily as needed   Patient is due for next adherence delivery on: 08/20/2022. Called patient and reviewed medications and coordinated delivery.  This delivery to include: Gabapentin 300 mg Take one capsule by mouth everyday at bedtime Ezetimibe 10 mg Tab Take one tab by mouth once daily Dexlansoprazole 60 mg Cap Take one capsule once daily Desvenlafaxine 100 mg-Take one tab by mouth once daily Amlodipine 5 mg once daily Propranolol 20 mg tab Take one tab by mouth twice daily  Rosuvastatin 40 mg- Take one tab by mouth once daily Bupropion HCL XL 300 mg - once daily Lorazepam 0.5 mg twice daily as needed Isosorbide 30 mg 1/2 tablet once daily  Patient needs refills for: Amlodipine 5 mg once daily Dexlansoprazole 60 mg Cap Take one capsule once daily  Confirmed delivery date of 08/20/2022, advised patient that pharmacy will contact them the morning of delivery.  Care Gaps: Medicare Annual Wellness: Completed 05/2021 Hemoglobin A1C: 6.2% on 04/27/2011 Colonoscopy: Next due on 07/14/2024 Dexa Scan: Completed Mammogram: Ordered on 05/19/2021  Future Appointments  Date Time Provider [place]  08/17/2022  8:20 AM Marin Olp, MD [facility]  09/24/2022  2:00 PM [facility] CCM PHARMACIST [facility]  12/14/2022  2:00 PM Marin Olp, MD [facility]  01/25/2023  9:20 AM Sherren Mocha, MD [facility]  06/10/2023  8:30 AM [facility] HEALTH COACH [facility]   Star Rating Drugs: Rosuvastatin 40 mg last filled 05/16/2022 90 DS  [name], [facility] Pharmacist Assistant [phone]

## 2022-08-10 ENCOUNTER — Encounter: Payer: PPO | Admitting: Family Medicine

## 2022-08-17 ENCOUNTER — Ambulatory Visit (INDEPENDENT_AMBULATORY_CARE_PROVIDER_SITE_OTHER): Payer: Medicare HMO | Admitting: Family Medicine

## 2022-08-17 ENCOUNTER — Encounter: Payer: Self-pay | Admitting: Family Medicine

## 2022-08-17 VITALS — BP 108/62 | HR 68 | Temp 97.2°F | Ht 62.0 in | Wt 127.2 lb

## 2022-08-17 DIAGNOSIS — I10 Essential (primary) hypertension: Secondary | ICD-10-CM | POA: Diagnosis not present

## 2022-08-17 DIAGNOSIS — R0600 Dyspnea, unspecified: Secondary | ICD-10-CM | POA: Diagnosis not present

## 2022-08-17 DIAGNOSIS — I25119 Atherosclerotic heart disease of native coronary artery with unspecified angina pectoris: Secondary | ICD-10-CM | POA: Diagnosis not present

## 2022-08-17 DIAGNOSIS — J449 Chronic obstructive pulmonary disease, unspecified: Secondary | ICD-10-CM

## 2022-08-17 DIAGNOSIS — Z59819 Housing instability, housed unspecified: Secondary | ICD-10-CM | POA: Diagnosis not present

## 2022-08-17 DIAGNOSIS — E7800 Pure hypercholesterolemia, unspecified: Secondary | ICD-10-CM

## 2022-08-17 MED ORDER — TRELEGY ELLIPTA 200-62.5-25 MCG/ACT IN AEPB: 1.0000 | INHALATION_SPRAY | Freq: Every day | RESPIRATORY_TRACT | 5 refills | Status: DC

## 2022-08-17 NOTE — Progress Notes (Signed)
Phone [phone] In person visit   Subjective:   Katherine Foley is a 71 y.o. year old very pleasant female patient who presents for/with See problem oriented charting Chief Complaint  Patient presents with   Referral    Pt would like to discuss referral to pulmonology, pt states she already has an appt on 12/21.    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    Priority: High   Depression 09/22/2015    Priority: High   Former smoker 09/22/2015    Priority: High   S/P AVR (aortic valve replacement) 11/06/2013    Priority: High   Aortic stenosis 07/07/2010    Priority: High   Coronary artery disease involving native coronary artery with angina pectoris ([place]) 05/17/2010    Priority: High   Chronic obstructive pulmonary disease ([place]) 07/23/2007    Priority: High   Aortic atherosclerosis ([place]) 03/10/2020    Priority: Medium    Restless legs 08/25/2019    Priority: Medium    Hot flashes 03/22/2016    Priority: Medium    Diastolic dysfunction 87/68/1157    Priority: Medium    GERD (gastroesophageal reflux disease) 06/04/2014    Priority: Medium    Carotid stenosis 05/17/2010    Priority: Medium    HLD (hyperlipidemia) 08/29/2009    Priority: Medium    Essential hypertension 04/14/2007    Priority: Medium    Osteopenia 04/14/2007    Priority: Medium    Senile purpura ([place]) 03/05/2022    Priority: Low   Genetic testing 02/10/2018    Priority: Low   Family history of breast cancer     Priority: Low   Allergic rhinitis 09/22/2015    Priority: Low   Adenomatous colon polyp     Priority: Low   Herpes simplex virus (HSV) infection 01/16/2010    Priority: Low   COVID-19 07/31/2022   Hypokalemia 07/31/2022   Anemia 07/31/2022   Coronary artery disease involving native coronary artery of native heart without angina pectoris 07/23/2022    Precordial chest pain 11/06/2013    Medications- reviewed and updated Current Outpatient Medications  Medication Sig Dispense Refill   acetaminophen (TYLENOL) 650 MG CR tablet Take 1,300 mg by mouth every 8 (eight) hours as needed for pain.     albuterol (VENTOLIN HFA) 108 (90 Base) MCG/ACT inhaler Inhale 2 puffs into the lungs every 6 (six) hours as needed for wheezing or shortness of breath. 8 g 3   amLODipine (NORVASC) 5 MG tablet TAKE ONE TABLET BY MOUTH ONCE DAILY 90 tablet 3   aspirin EC 81 MG tablet Take 1 tablet (81 mg total) by mouth daily.     buPROPion (WELLBUTRIN XL) 300 MG 24 hr tablet TAKE ONE TABLET BY MOUTH ONCE DAILY 30 tablet 5   Calcium Carb-Cholecalciferol (CALCIUM 500/D PO) Take 2 tablets by mouth daily. Mag and zinc added     cetirizine (ZYRTEC) 10 MG tablet Take 10 mg by mouth at bedtime.     desvenlafaxine (PRISTIQ) 100 MG 24 hr tablet TAKE ONE TABLET BY MOUTH EVERY MORNING 90 tablet 2   dexlansoprazole (DEXILANT) 60 MG capsule TAKE ONE CAPSULE BY MOUTH ONCE DAILY 90 capsule 3   ezetimibe (ZETIA) 10 MG tablet TAKE ONE TABLET BY MOUTH ONCE DAILY 90 tablet 1   fluticasone (CUTIVATE) 0.05 % cream Apply 1 Application topically 2 (two)  times daily as needed (eczema).     fluticasone (FLONASE) 50 MCG/ACT nasal spray Place 2 sprays into both nostrils as needed for allergies or rhinitis. 16 g 3   Fluticasone-Umeclidin-Vilant (TRELEGY ELLIPTA) 200-62.5-25 MCG/ACT AEPB Inhale 1 Inhalation into the lungs daily. 30 each 5   gabapentin (NEURONTIN) 300 MG capsule TAKE ONE CAPSULE BY MOUTH EVERYDAY AT BEDTIME 90 capsule 3   isosorbide mononitrate (IMDUR) 30 MG 24 hr tablet Take 0.5 tablets (15 mg total) by mouth daily. 45 tablet 3   LORazepam (ATIVAN) 0.5 MG tablet TAKE ONE TABLET BY MOUTH twice daily AS NEEDED FOR ANXIETY. Do not drive 8 hours after using. 60 tablet 1   Multiple Vitamins-Minerals (MULTIVITAMIN ADULT PO) Take 1 tablet by mouth daily.      nitroGLYCERIN (NITROSTAT) 0.4  MG SL tablet Place 1 tablet (0.4 mg total) under the tongue every 5 (five) minutes as needed for chest pain (if pain does not resolve with 2 doses, call 911). 30 tablet 5   rosuvastatin (CRESTOR) 40 MG tablet Take 1 tablet (40 mg total) by mouth daily. 90 tablet 2   valACYclovir (VALTREX) 1000 MG tablet Take 1,000 mg by mouth daily as needed (for fever blisters).     No current facility-administered medications for this visit.     Objective:  BP 108/62   Pulse 68   Temp (!) 97.2 F (36.2 C)   Ht '5\' 2"'$  (1.575 m)   Wt 127 lb 3.2 oz (57.7 kg)   SpO2 96%   BMI 23.27 kg/m  Gen: NAD, resting comfortably CV: RRR stable murmur Lungs: crackles left lung base otherwise largely clear Ext: no edema Skin: warm, dry     Assessment and Plan   #social update- trying to find a place to live but really hard to get set up. Person she is living with is selling her home.  -CCM referral under SDOH [id number] ordered today- patient agrees   #COPD/former smoker S: Medication:Spiriva and Symbicort. Also using albuterol 2-3 x a day.  -Prior lung cancer screening program but quit smoking in 2007 - feels like after having covid around thanksgiving that her breathing has worsened. Wheezing more in general. More short of breath. More cough but dry.  -when had covid was also treated with prednisone for copd- she did not feel like helped that much. Also had strep at same time and treated with amoxicillin and molnupiravir.  - denies recent chest pain A/P: COPD poor control apparently with increased wheeze and shortness of breath (denies chest pain) - We will stop her Spiriva and Symbicort and changed to Trelegy - Has close follow-up with pulmonology within the next week - Does have some crackles on the left lower side-ordered chest x-ray but she is not sure if she can do this today due to work   #CAD-Status post prior PCI to the RCA and RI and subsequent CABG in 2012.  Cardiac catheterization in 2019  demonstrated patent stents in the RCA and RI, atretic LIMA-LAD and mild nonobstructive disease in the LAD.  #hyperlipidemia with aortic atherosclerosis S: Medication:Aspirin 81 mg, Zetia 10 mg, rosuvastatin 40 mg -Also had cath on 07/23/2022-plan was medical management Lab Results  Component Value Date   CHOL 112 03/05/2022   HDL 51.20 03/05/2022   LDLCALC 44 03/05/2022   LDLDIRECT 65.0 06/19/2017   TRIG 86.0 03/05/2022   CHOLHDL 2 03/05/2022    A/P: CAD-patient did have some prior chest pain but may have been stress related-she is in  rather intense stress with her housing situation Hyperlipidemia-at goal-continue rosuvastatin and Zetia  #hypertension S: medication: Amlodipine 5 mg, propranolol 10 mg twice daily BP Readings from Last 3 Encounters:  08/17/22 108/62  07/31/22 110/60  07/24/22 108/64  A/P: stable- continue current medicines  Recommended follow up: Return in about 1 month (around 09/17/2022) for followup or sooner if needed.Schedule b4 you leave. Future Appointments  Date Time Provider [place]  08/23/2022  9:00 AM Juanito Doom, MD [facility] None  09/19/2022  9:20 AM Marin Olp, MD [facility]  09/24/2022  2:00 PM [facility] CCM PHARMACIST [facility]  12/14/2022  2:00 PM Marin Olp, MD [facility]  01/25/2023  9:20 AM Sherren Mocha, MD [facility]  06/10/2023  8:30 AM [facility] HEALTH COACH [facility]    Lab/Order associations:   ICD-10-CM   1. Chronic obstructive pulmonary disease, unspecified COPD type ([place])  J44.9 Ambulatory referral to Pulmonology    AMB Referral to [place] ([facility] Patients)    2. Dyspnea, unspecified type  R06.00 DG Chest 2 View    3. Housing instability  Z59.819 AMB Referral to [place] ([facility] Patients)    4. Coronary artery disease involving native coronary artery of native heart with angina pectoris ([place])  I25.119     5. Essential hypertension  I10     6.  Pure hypercholesterolemia  E78.00       Meds ordered this encounter  Medications   Fluticasone-Umeclidin-Vilant (TRELEGY ELLIPTA) 200-62.5-25 MCG/ACT AEPB    Sig: Inhale 1 Inhalation into the lungs daily.    Dispense:  30 each    Refill:  5   Return precautions advised.  Garret Reddish, MD

## 2022-08-17 NOTE — Patient Instructions (Addendum)
Get mammogram scheduled.  Once you get the Trelegy- switch to that and take just once a day  Do not take the spiriva and symbicort once you get new inhaler but continue until you get it.   I would hate to lose you but if you decide to transfer I think Dr. Randol Kern or Dr. Cherlynn Kaiser would take great care of you in our office.   Propranolol can affect breathing- you have been on this a long time- lets se if you do ok without it- take half tablet twice daily through weekend then can try off- check blood pressure and HR - want blood pressure <135/85 definitely and HR under 90 ideally  Please go to [facility]  central X-ray (updated 10/29/2019) - located [address] across the street from [place] - in the basement - Hours: 8:30-5:00 PM M-F (with lunch from 12:30- 1 PM). You do NOT need an appointment.    Recommended follow up: Return in about 1 month (around 09/17/2022) for followup or sooner if needed.Schedule b4 you leave.

## 2022-08-20 ENCOUNTER — Ambulatory Visit (INDEPENDENT_AMBULATORY_CARE_PROVIDER_SITE_OTHER)
Admission: RE | Admit: 2022-08-20 | Discharge: 2022-08-20 | Disposition: A | Discharge status: Home / Self Care | Payer: Medicare HMO | Source: Ambulatory Visit | Attending: Family Medicine | Admitting: Family Medicine

## 2022-08-20 DIAGNOSIS — R0600 Dyspnea, unspecified: Secondary | ICD-10-CM | POA: Diagnosis not present

## 2022-08-20 DIAGNOSIS — R0989 Other specified symptoms and signs involving the circulatory and respiratory systems: Secondary | ICD-10-CM | POA: Diagnosis not present

## 2022-08-23 ENCOUNTER — Ambulatory Visit: Payer: Medicare HMO | Admitting: Pulmonary Disease

## 2022-08-23 ENCOUNTER — Encounter: Payer: Self-pay | Admitting: Pulmonary Disease

## 2022-08-23 ENCOUNTER — Telehealth: Payer: Self-pay | Admitting: *Deleted

## 2022-08-23 VITALS — BP 128/62 | HR 87 | Temp 98.0°F | Ht 62.0 in | Wt 126.4 lb

## 2022-08-23 DIAGNOSIS — J441 Chronic obstructive pulmonary disease with (acute) exacerbation: Secondary | ICD-10-CM

## 2022-08-23 DIAGNOSIS — R0602 Shortness of breath: Secondary | ICD-10-CM

## 2022-08-23 LAB — CBC WITH DIFFERENTIAL/PLATELET
Basophils Absolute: 0 10*3/uL (ref 0.0–0.1)
Basophils Relative: 0.4 % (ref 0.0–3.0)
Eosinophils Absolute: 0.1 10*3/uL (ref 0.0–0.7)
Eosinophils Relative: 1.7 % (ref 0.0–5.0)
HCT: 37.2 % (ref 36.0–46.0)
Hemoglobin: 12.1 g/dL (ref 12.0–15.0)
Lymphocytes Relative: 25.5 % (ref 12.0–46.0)
Lymphs Abs: 1 10*3/uL (ref 0.7–4.0)
MCHC: 32.5 g/dL (ref 30.0–36.0)
MCV: 94.2 fl (ref 78.0–100.0)
Monocytes Absolute: 0.6 10*3/uL (ref 0.1–1.0)
Monocytes Relative: 17.2 % — ABNORMAL HIGH (ref 3.0–12.0)
Neutro Abs: 2.1 10*3/uL (ref 1.4–7.7)
Neutrophils Relative %: 55.2 % (ref 43.0–77.0)
Platelets: 177 10*3/uL (ref 150.0–400.0)
RBC: 3.95 Mil/uL (ref 3.87–5.11)
RDW: 14.4 % (ref 11.5–15.5)
WBC: 3.8 10*3/uL — ABNORMAL LOW (ref 4.0–10.5)

## 2022-08-23 MED ORDER — BENZONATATE 200 MG PO CAPS: 200.0000 mg | ORAL_CAPSULE | Freq: Three times a day (TID) | ORAL | 1 refills | Status: DC | PRN

## 2022-08-23 MED ORDER — DOXYCYCLINE HYCLATE 100 MG PO TABS: 100.0000 mg | ORAL_TABLET | Freq: Two times a day (BID) | ORAL | 0 refills | Status: DC

## 2022-08-23 MED ORDER — PREDNISONE 20 MG PO TABS: 20.0000 mg | ORAL_TABLET | Freq: Every day | ORAL | 0 refills | Status: DC

## 2022-08-23 MED ORDER — BREZTRI AEROSPHERE 160-9-4.8 MCG/ACT IN AERO: 2.0000 | INHALATION_SPRAY | Freq: Two times a day (BID) | RESPIRATORY_TRACT | 0 refills | Status: DC

## 2022-08-23 NOTE — Telephone Encounter (Signed)
   Telephone encounter was:  Unsuccessful.  08/23/2022 Name: Katherine Foley MRN: [id number] DOB: [birth year]  Unsuccessful outbound call made today to assist with:   Housing   Outreach Attempt:  1st Attempt  A HIPAA compliant voice message was left requesting a return call.  Instructed patient to call back at [phone].  [facility] [phone] [address] , [place] [address] Email : [email or web address]

## 2022-08-23 NOTE — Patient Instructions (Signed)
COPD with recurrent exacerbations: Stop Trelegy Start Breztri 2 sprays twice a day Will get your prescription for a nebulizer machine to use at home and DuoNeb to use every 6 hours as needed for shortness of breath Prednisone 20 mg daily x 5 days Doxycycline 100 mg twice daily x 5 days We need to figure out why this is happening: CBC with differential, IgE  Worsening shortness of breath: Lung function test High-resolution CT scan of the chest  Cough: Use Mucinex over-the-counter to help thin the mucus Tessalon 200 mg every 8 hours as needed cough  We will see you back in 2 weeks with a nurse practitioner to see how you are doing.

## 2022-08-23 NOTE — Progress Notes (Signed)
Synopsis: Referred in December 2023 for COPD Quit smoking 2007 after smoking 1.5ppd for teenage years through age 71 Had Aortic valve replacement 1610, complicated by left lung pleural effusion requiring   Subjective:   PATIENT ID: Katherine Foley GENDER: female DOB: 10/11/1950, MRN: [id number]   HPI  Chief Complaint  Patient presents with   Consult    SOB, cough and dyspnea x 2 weeks.  Dx COVID and strep throat 07/24/2022    Katherine Foley has COPD and is here to see me for the same > she had COVID not too long ago and she had increased chest congestion > she was told that she had fluid in her lungs on a recent chest x-ray > she had worsening dyspnea over the last few months > a year ago her symptoms were well controlled on spiriva and symbicort > No clear triggers why she started feeling short of breath prior to getting COVID, no environmental changes > had a heart cath this year that didn't  > She was given prednisone and molnupiravir when she had COVID November > at home she has a back up inhaler, no nebulizer  She has a hard time with Trelegy, often doesn't get it in.   Record review: Primary care notes from Dr. Garret Reddish reviewed where the patient was seen in the context of COPD.  Had worsening shortness of breath.  Changed from Spiriva and Symbicort to Trelegy.  Chest x-ray ordered due to crackles heard in the left lower lobe.  Past Medical History:  Diagnosis Date   Adenomatous colon polyp    Anxiety    Anxiety/Depression 02/15/2009   Aortic stenosis s/p bioprosthetic AVR 07/07/2010   Echo 9/19:  Mild conc LVH, EF 60-65, no RWMA, Gr 1 DD, AVR ok (mean 10, peak 21), trivial MR, normal RVSF, mild TR // Echo 10/22: EF 60-65, no RWMA, normal RVSF, AVR with increased gradients (mean 24 increased from 10 in 2019) // Echocardiogram 10/23: EF 60-65, no RWMA, mildly reduced RVSF, AVR w mean gradient 17.3 mmHg   Barrett's esophagus    04/2007; 2000   Carotid stenosis  05/17/2010   a. dopplers 11/91: RICA 47-82%; LICA 9-56% (follow up due 11/12)   COPD 07/23/2007   Coronary Artery Disease 05/17/2010   NSTEMI 8/11 - BMS to RCA;  cath 4/12: dLAD 50%, RI 80% (PCI), AVCFX 30%, pRCA 30%, stent ok, 40-50, then 50% dist RCA;  c. s/p Promus DES to RI 12/25/10;  d. 04/2011 CABG x 1 (LIMA->LAD) @ time of AVR // Cath in 9/19: RI and RCA stents ok; L-LAD atretic, mild disease in mLAD >> med Rx   Family history of breast cancer    2 daughters; PALB2 mutation in family   Genetic testing 02/10/2018   PALB2 analysis at [facility] - Negative for familial mutation   GERD    H/O hiatal hernia    Hemorrhoid thrombosis    "had it lanced" (03/25/2013)   Herpes simplex without mention of complication 21/30/8657   History of blood transfusion    'w/OHS" (03/25/2013)   Hyperlipidemia 08/29/2009   Hypertension 04/14/2007   Migraine HAs 04/20/2008   Myocardial infarction [facility]) 2011   "during catherization" (03/25/2013)   OSTEOPENIA 04/14/2007   Ovarian cyst    Parotid mass    left   Pneumonia    "multiple times" (03/25/2013)   Urine, incontinence, stress female      Family History  Problem Relation Age of Onset   Coronary artery disease  Other    Heart disease Other        6 stents   Heart disease Mother    COPD Father        smoked   Heart disease Father    Rheum arthritis Father    Heart disease Maternal Uncle    Breast cancer Daughter        currently 28   Asthma Son        as a child    Breast cancer Daughter 53       currently 31; PALB2 mutation   Cancer Other        very distant paternal cousin; pancreatic ca   Cirrhosis Sister    Stroke Brother    Colon cancer Neg Hx    Colon polyps Neg Hx      Social History   Socioeconomic History   Marital status: Single    Spouse name: Not on file   Number of children: 3   Years of education: Not on file   Highest education level: Not on file  Occupational History   Occupation: Glass blower/designer: [employer]    Occupation: retired  Tobacco Use   Smoking status: Former    Packs/day: 1.50    Years: 44.00    Total pack years: 66.00    Types: Cigarettes    Quit date: 09/03/2005    Years since quitting: 16.9   Smokeless tobacco: Never  Vaping Use   Vaping Use: Never used  Substance and Sexual Activity   Alcohol use: Not Currently    Alcohol/week: 0.0 standard drinks of alcohol    Comment: social   Drug use: No   Sexual activity: Not Currently    Birth control/protection: Post-menopausal, Surgical  Other Topics Concern   Not on file  Social History Narrative   Lost fiancee of 22 years in 2022. 3 children (2 living). 6 grandkids. 2 greatgrandkids. Keeps greatgrandson on tuesdays- loves her so much.       Retired from [employer] (parttime- Dec 12)   Faith is very important to her      Hobbies: reading, cross stich, sew. Wants to learn to knit.       Social Determinants of Health   Financial Resource Strain: Low Risk  (06/07/2022)   Overall Financial Resource Strain (CARDIA)    Difficulty of Paying Living Expenses: Not very hard  Food Insecurity: No Food Insecurity (06/07/2022)   Hunger Vital Sign    Worried About Running Out of Food in the Last Year: Never true    Ran Out of Food in the Last Year: Never true  Transportation Needs: No Transportation Needs (06/07/2022)   PRAPARE - Hydrologist (Medical): No    Lack of Transportation (Non-Medical): No  Physical Activity: Sufficiently Active (06/07/2022)   Exercise Vital Sign    Days of Exercise per Week: 5 days    Minutes of Exercise per Session: 30 min  Stress: Stress Concern Present (06/07/2022)   [place]    Feeling of Stress : To some extent  Social Connections: Moderately Isolated (06/07/2022)   Social Connection and Isolation Panel [NHANES]    Frequency of Communication with Friends and Family: Once a week    Frequency of Social Gatherings  with Friends and Family: More than three times a week    Attends Religious Services: More than 4 times per year    Active Member of  Clubs or Organizations: No    Attends Archivist Meetings: Never    Marital Status: Never married  Intimate Partner Violence: Not At Risk (06/07/2022)   Humiliation, Afraid, Rape, and Kick questionnaire    Fear of Current or Ex-Partner: No    Emotionally Abused: No    Physically Abused: No    Sexually Abused: No     Allergies  Allergen Reactions   Codeine Sulfate Itching and Rash   Hydrocodone-Acetaminophen Itching and Rash     Outpatient Medications Prior to Visit  Medication Sig Dispense Refill   acetaminophen (TYLENOL) 650 MG CR tablet Take 1,300 mg by mouth every 8 (eight) hours as needed for pain.     albuterol (VENTOLIN HFA) 108 (90 Base) MCG/ACT inhaler Inhale 2 puffs into the lungs every 6 (six) hours as needed for wheezing or shortness of breath. 8 g 3   amLODipine (NORVASC) 5 MG tablet TAKE ONE TABLET BY MOUTH ONCE DAILY 90 tablet 3   aspirin EC 81 MG tablet Take 1 tablet (81 mg total) by mouth daily.     buPROPion (WELLBUTRIN XL) 300 MG 24 hr tablet TAKE ONE TABLET BY MOUTH ONCE DAILY 30 tablet 5   Calcium Carb-Cholecalciferol (CALCIUM 500/D PO) Take 2 tablets by mouth daily. Mag and zinc added     cetirizine (ZYRTEC) 10 MG tablet Take 10 mg by mouth at bedtime.     desvenlafaxine (PRISTIQ) 100 MG 24 hr tablet TAKE ONE TABLET BY MOUTH EVERY MORNING 90 tablet 2   dexlansoprazole (DEXILANT) 60 MG capsule TAKE ONE CAPSULE BY MOUTH ONCE DAILY 90 capsule 3   ezetimibe (ZETIA) 10 MG tablet TAKE ONE TABLET BY MOUTH ONCE DAILY 90 tablet 1   fluticasone (CUTIVATE) 0.05 % cream Apply 1 Application topically 2 (two) times daily as needed (eczema).     fluticasone (FLONASE) 50 MCG/ACT nasal spray Place 2 sprays into both nostrils as needed for allergies or rhinitis. 16 g 3   Fluticasone-Umeclidin-Vilant (TRELEGY ELLIPTA) 200-62.5-25 MCG/ACT AEPB  Inhale 1 Inhalation into the lungs daily. 30 each 5   gabapentin (NEURONTIN) 300 MG capsule TAKE ONE CAPSULE BY MOUTH EVERYDAY AT BEDTIME 90 capsule 3   isosorbide mononitrate (IMDUR) 30 MG 24 hr tablet Take 0.5 tablets (15 mg total) by mouth daily. 45 tablet 3   LORazepam (ATIVAN) 0.5 MG tablet TAKE ONE TABLET BY MOUTH twice daily AS NEEDED FOR ANXIETY. Do not drive 8 hours after using. 60 tablet 1   Multiple Vitamins-Minerals (MULTIVITAMIN ADULT PO) Take 1 tablet by mouth daily.      nitroGLYCERIN (NITROSTAT) 0.4 MG SL tablet Place 1 tablet (0.4 mg total) under the tongue every 5 (five) minutes as needed for chest pain (if pain does not resolve with 2 doses, call 911). 30 tablet 5   rosuvastatin (CRESTOR) 40 MG tablet Take 1 tablet (40 mg total) by mouth daily. 90 tablet 2   valACYclovir (VALTREX) 1000 MG tablet Take 1,000 mg by mouth daily as needed (for fever blisters).     No facility-administered medications prior to visit.    Review of Systems  Constitutional:  Negative for chills, fever, malaise/fatigue and weight loss.  HENT:  Negative for congestion, nosebleeds, sinus pain and sore throat.   Eyes:  Negative for photophobia, pain and discharge.  Respiratory:  Positive for cough, sputum production, shortness of breath and wheezing. Negative for hemoptysis.   Cardiovascular:  Negative for chest pain, palpitations, orthopnea and leg swelling.  Gastrointestinal:  Negative for  abdominal pain, constipation, diarrhea, nausea and vomiting.  Genitourinary:  Negative for dysuria, frequency, hematuria and urgency.  Musculoskeletal:  Negative for back pain, joint pain, myalgias and neck pain.  Skin:  Negative for itching and rash.  Neurological:  Negative for tingling, tremors, sensory change, speech change, focal weakness, seizures, weakness and headaches.  Psychiatric/Behavioral:  Negative for memory loss, substance abuse and suicidal ideas. The patient is not nervous/anxious.        Objective:  Physical Exam   Vitals:   08/23/22 0910  BP: 128/62  Pulse: 87  Temp: 98 F (36.7 C)  TempSrc: Oral  SpO2: 92%  Weight: 126 lb 6.4 oz (57.3 kg)  Height: _0  (1.575 m)    Gen: Anxious, mildly ill appearing HENT: OP clear, neck supple PULM: Upper airway wheeze which is intermittent, no lower airway wheezinB, normal effort  CV: RRR, no mgr GI: BS+, soft, nontender Derm: no cyanosis or rash Psyche:Anxious   CBC    Component Value Date/Time   WBC 9.9 07/31/2022 1143   WBC 7.0 04/09/2022 1408   RBC 3.84 07/31/2022 1143   RBC 3.84 (L) 04/09/2022 1408   HGB 11.9 07/31/2022 1143   HCT 36.6 07/31/2022 1143   PLT 223 07/31/2022 1143   MCV 95 07/31/2022 1143   MCH 31.0 07/31/2022 1143   MCH 29.0 07/04/2016 1200   MCHC 32.5 07/31/2022 1143   MCHC 32.5 04/09/2022 1408   RDW 12.7 07/31/2022 1143   LYMPHSABS 1.2 04/09/2022 1408   MONOABS 0.7 04/09/2022 1408   EOSABS 0.1 04/09/2022 1408   BASOSABS 0.0 04/09/2022 1408     Chest imaging: 2018 lung cancer screening CT scan personally reviewed showing centrilobular emphysema, some blunting of the left hemidiaphragm December 2023 two-view chest x-ray images independently reviewed showing hyperinflation of the right, elevation of the right hemidiaphragm and blunting of the costophrenic angle similar to studies from 2021.  Sternal wires, prosthetic aortic valve  PFT: 2016 pulmonary function testing ratio 57%, FEV1 1.28 L 58% predicted, 9% change with bronchodilator, total lung capacity 5.24 L 110% predicted, residual volume 139% predicted, DLCO 15.67 72% predicted  Labs: November 2023 hemoglobin 11.9 g/dL  Path:  Echo:  Heart Catheterization:       Assessment & Plan:   No diagnosis found.  Discussion: Ms. Smolinski returns to our clinic for the first time in several years.  She quit smoking in 2007 after an extensive smoking history and was left with centrilobular emphysema and at least moderate  COPD based on lung function testing in 2016.  She has now been having recurrent episodes of COPD exacerbation since having COVID.  She also has significant upper airway wheezing which is variable and likely due to vocal cord dysfunction in the setting of significant stress at home.  We need to know why she is having recurrent exacerbations.  She needs to be treated for an exacerbation today.  She needs to have better rescue medicine at home.  She is not getting Trelegy into her lung because she coughs frequently when taking it (dry powder inhaler) so we need to switch her back to an aerosol device.  If no improvement with treatment of a COPD exacerbation this week she needs to go to the hospital.  The finding of the pleural effusion in her left lung is actually chronic and related to the 2011 aortic valve surgery which was complicated by left-sided pleural effusion requiring multiple thoracenteses.  No change on recent imaging.  Plan: COPD with  recurrent exacerbations: Stop Trelegy Start Breztri 2 sprays twice a day Will get your prescription for a nebulizer machine to use at home and DuoNeb to use every 6 hours as needed for shortness of breath Prednisone 20 mg daily x 5 days Doxycycline 100 mg twice daily x 5 days We need to figure out why this is happening: CBC with differential, IgE  Worsening shortness of breath: Lung function test High-resolution CT scan of the chest  Cough: Use Mucinex over-the-counter to help thin the mucus Tessalon 200 mg every 8 hours as needed cough  We will see you back in 2 weeks with a nurse practitioner to see how you are doing.  Immunizations: Immunization History  Administered Date(s) Administered   Fluad Quad(high Dose 65+) 07/14/2020   Influenza Split 06/19/2011, 07/02/2012   Influenza Whole 08/03/2009   Influenza, High Dose Seasonal PF 05/20/2015, 06/04/2019, 06/04/2019   Influenza,inj,Quad PF,6+ Mos 05/21/2013, 05/14/2014, 06/17/2018    Influenza-Unspecified 08/17/2016, 06/19/2017, 06/17/2018   PFIZER(Purple Top)SARS-COV-2 Vaccination 10/20/2019, 11/09/2019, 06/30/2020   Pneumococcal Conjugate-13 06/14/2014   Pneumococcal Polysaccharide-23 03/22/2016   Td 08/29/2009, 03/17/2020   Zoster Recombinat (Shingrix) 03/10/2018, 07/15/2018   Zoster, Live 01/21/2016     Current Outpatient Medications:    acetaminophen (TYLENOL) 650 MG CR tablet, Take 1,300 mg by mouth every 8 (eight) hours as needed for pain., Disp: , Rfl:    albuterol (VENTOLIN HFA) 108 (90 Base) MCG/ACT inhaler, Inhale 2 puffs into the lungs every 6 (six) hours as needed for wheezing or shortness of breath., Disp: 8 g, Rfl: 3   amLODipine (NORVASC) 5 MG tablet, TAKE ONE TABLET BY MOUTH ONCE DAILY, Disp: 90 tablet, Rfl: 3   aspirin EC 81 MG tablet, Take 1 tablet (81 mg total) by mouth daily., Disp:  , Rfl:    buPROPion (WELLBUTRIN XL) 300 MG 24 hr tablet, TAKE ONE TABLET BY MOUTH ONCE DAILY, Disp: 30 tablet, Rfl: 5   Calcium Carb-Cholecalciferol (CALCIUM 500/D PO), Take 2 tablets by mouth daily. Mag and zinc added, Disp: , Rfl:    cetirizine (ZYRTEC) 10 MG tablet, Take 10 mg by mouth at bedtime., Disp: , Rfl:    desvenlafaxine (PRISTIQ) 100 MG 24 hr tablet, TAKE ONE TABLET BY MOUTH EVERY MORNING, Disp: 90 tablet, Rfl: 2   dexlansoprazole (DEXILANT) 60 MG capsule, TAKE ONE CAPSULE BY MOUTH ONCE DAILY, Disp: 90 capsule, Rfl: 3   ezetimibe (ZETIA) 10 MG tablet, TAKE ONE TABLET BY MOUTH ONCE DAILY, Disp: 90 tablet, Rfl: 1   fluticasone (CUTIVATE) 0.05 % cream, Apply 1 Application topically 2 (two) times daily as needed (eczema)., Disp: , Rfl:    fluticasone (FLONASE) 50 MCG/ACT nasal spray, Place 2 sprays into both nostrils as needed for allergies or rhinitis., Disp: 16 g, Rfl: 3   Fluticasone-Umeclidin-Vilant (TRELEGY ELLIPTA) 200-62.5-25 MCG/ACT AEPB, Inhale 1 Inhalation into the lungs daily., Disp: 30 each, Rfl: 5   gabapentin (NEURONTIN) 300 MG capsule, TAKE ONE  CAPSULE BY MOUTH EVERYDAY AT BEDTIME, Disp: 90 capsule, Rfl: 3   isosorbide mononitrate (IMDUR) 30 MG 24 hr tablet, Take 0.5 tablets (15 mg total) by mouth daily., Disp: 45 tablet, Rfl: 3   LORazepam (ATIVAN) 0.5 MG tablet, TAKE ONE TABLET BY MOUTH twice daily AS NEEDED FOR ANXIETY. Do not drive 8 hours after using., Disp: 60 tablet, Rfl: 1   Multiple Vitamins-Minerals (MULTIVITAMIN ADULT PO), Take 1 tablet by mouth daily. , Disp: , Rfl:    nitroGLYCERIN (NITROSTAT) 0.4 MG SL tablet, Place 1 tablet (0.4  mg total) under the tongue every 5 (five) minutes as needed for chest pain (if pain does not resolve with 2 doses, call 911)., Disp: 30 tablet, Rfl: 5   rosuvastatin (CRESTOR) 40 MG tablet, Take 1 tablet (40 mg total) by mouth daily., Disp: 90 tablet, Rfl: 2   valACYclovir (VALTREX) 1000 MG tablet, Take 1,000 mg by mouth daily as needed (for fever blisters)., Disp: , Rfl:

## 2022-08-24 LAB — IGE: IgE (Immunoglobulin E), Serum: 6 kU/L (ref <=114)

## 2022-08-28 ENCOUNTER — Inpatient Hospital Stay (HOSPITAL_COMMUNITY)
Admission: EM | Admit: 2022-08-28 | Discharge: 2022-09-01 | DRG: 190 | Disposition: A | Discharge status: Home / Self Care | Payer: Medicare HMO | Attending: Family Medicine | Admitting: Family Medicine

## 2022-08-28 ENCOUNTER — Telehealth: Payer: Self-pay | Admitting: Pulmonary Disease

## 2022-08-28 ENCOUNTER — Emergency Department (HOSPITAL_COMMUNITY): Payer: Medicare HMO

## 2022-08-28 DIAGNOSIS — E7800 Pure hypercholesterolemia, unspecified: Secondary | ICD-10-CM | POA: Diagnosis not present

## 2022-08-28 DIAGNOSIS — J440 Chronic obstructive pulmonary disease with acute lower respiratory infection: Secondary | ICD-10-CM | POA: Diagnosis present

## 2022-08-28 DIAGNOSIS — Z8249 Family history of ischemic heart disease and other diseases of the circulatory system: Secondary | ICD-10-CM

## 2022-08-28 DIAGNOSIS — R0602 Shortness of breath: Secondary | ICD-10-CM | POA: Diagnosis not present

## 2022-08-28 DIAGNOSIS — Z953 Presence of xenogenic heart valve: Secondary | ICD-10-CM

## 2022-08-28 DIAGNOSIS — Z7951 Long term (current) use of inhaled steroids: Secondary | ICD-10-CM | POA: Diagnosis not present

## 2022-08-28 DIAGNOSIS — Z8616 Personal history of COVID-19: Secondary | ICD-10-CM

## 2022-08-28 DIAGNOSIS — F32A Depression, unspecified: Secondary | ICD-10-CM | POA: Diagnosis not present

## 2022-08-28 DIAGNOSIS — Z951 Presence of aortocoronary bypass graft: Secondary | ICD-10-CM

## 2022-08-28 DIAGNOSIS — Z8261 Family history of arthritis: Secondary | ICD-10-CM

## 2022-08-28 DIAGNOSIS — J441 Chronic obstructive pulmonary disease with (acute) exacerbation: Secondary | ICD-10-CM | POA: Diagnosis not present

## 2022-08-28 DIAGNOSIS — Z1152 Encounter for screening for COVID-19: Secondary | ICD-10-CM | POA: Diagnosis not present

## 2022-08-28 DIAGNOSIS — Z955 Presence of coronary angioplasty implant and graft: Secondary | ICD-10-CM | POA: Diagnosis not present

## 2022-08-28 DIAGNOSIS — J101 Influenza due to other identified influenza virus with other respiratory manifestations: Secondary | ICD-10-CM | POA: Diagnosis not present

## 2022-08-28 DIAGNOSIS — M8580 Other specified disorders of bone density and structure, unspecified site: Secondary | ICD-10-CM | POA: Diagnosis present

## 2022-08-28 DIAGNOSIS — J9601 Acute respiratory failure with hypoxia: Secondary | ICD-10-CM | POA: Diagnosis present

## 2022-08-28 DIAGNOSIS — Z79899 Other long term (current) drug therapy: Secondary | ICD-10-CM | POA: Diagnosis not present

## 2022-08-28 DIAGNOSIS — R0902 Hypoxemia: Secondary | ICD-10-CM | POA: Diagnosis not present

## 2022-08-28 DIAGNOSIS — Z7982 Long term (current) use of aspirin: Secondary | ICD-10-CM | POA: Diagnosis not present

## 2022-08-28 DIAGNOSIS — K219 Gastro-esophageal reflux disease without esophagitis: Secondary | ICD-10-CM | POA: Diagnosis present

## 2022-08-28 DIAGNOSIS — R69 Illness, unspecified: Secondary | ICD-10-CM | POA: Diagnosis not present

## 2022-08-28 DIAGNOSIS — Z885 Allergy status to narcotic agent status: Secondary | ICD-10-CM

## 2022-08-28 DIAGNOSIS — Z803 Family history of malignant neoplasm of breast: Secondary | ICD-10-CM

## 2022-08-28 DIAGNOSIS — Z87891 Personal history of nicotine dependence: Secondary | ICD-10-CM

## 2022-08-28 DIAGNOSIS — I252 Old myocardial infarction: Secondary | ICD-10-CM | POA: Diagnosis not present

## 2022-08-28 DIAGNOSIS — E785 Hyperlipidemia, unspecified: Secondary | ICD-10-CM | POA: Diagnosis not present

## 2022-08-28 DIAGNOSIS — I25119 Atherosclerotic heart disease of native coronary artery with unspecified angina pectoris: Secondary | ICD-10-CM | POA: Diagnosis present

## 2022-08-28 DIAGNOSIS — F419 Anxiety disorder, unspecified: Secondary | ICD-10-CM | POA: Diagnosis present

## 2022-08-28 DIAGNOSIS — J209 Acute bronchitis, unspecified: Secondary | ICD-10-CM | POA: Diagnosis not present

## 2022-08-28 DIAGNOSIS — I10 Essential (primary) hypertension: Secondary | ICD-10-CM | POA: Diagnosis present

## 2022-08-28 DIAGNOSIS — R059 Cough, unspecified: Secondary | ICD-10-CM | POA: Diagnosis not present

## 2022-08-28 DIAGNOSIS — Z823 Family history of stroke: Secondary | ICD-10-CM

## 2022-08-28 DIAGNOSIS — Z825 Family history of asthma and other chronic lower respiratory diseases: Secondary | ICD-10-CM

## 2022-08-28 DIAGNOSIS — J449 Chronic obstructive pulmonary disease, unspecified: Secondary | ICD-10-CM

## 2022-08-28 LAB — RESP PANEL BY RT-PCR (RSV, FLU A&B, COVID)  RVPGX2
Influenza A by PCR: POSITIVE — AB
Influenza B by PCR: NEGATIVE
Resp Syncytial Virus by PCR: NEGATIVE
SARS Coronavirus 2 by RT PCR: NEGATIVE

## 2022-08-28 LAB — CBC WITH DIFFERENTIAL/PLATELET
Abs Immature Granulocytes: 0.06 10*3/uL (ref 0.00–0.07)
Basophils Absolute: 0 10*3/uL (ref 0.0–0.1)
Basophils Relative: 0 %
Eosinophils Absolute: 0.1 10*3/uL (ref 0.0–0.5)
Eosinophils Relative: 1 %
HCT: 37.1 % (ref 36.0–46.0)
Hemoglobin: 12.3 g/dL (ref 12.0–15.0)
Immature Granulocytes: 1 %
Lymphocytes Relative: 7 %
Lymphs Abs: 0.7 10*3/uL (ref 0.7–4.0)
MCH: 31.2 pg (ref 26.0–34.0)
MCHC: 33.2 g/dL (ref 30.0–36.0)
MCV: 94.2 fL (ref 80.0–100.0)
Monocytes Absolute: 0.4 10*3/uL (ref 0.1–1.0)
Monocytes Relative: 5 %
Neutro Abs: 8.6 10*3/uL — ABNORMAL HIGH (ref 1.7–7.7)
Neutrophils Relative %: 86 %
Platelets: 240 10*3/uL (ref 150–400)
RBC: 3.94 MIL/uL (ref 3.87–5.11)
RDW: 13.6 % (ref 11.5–15.5)
WBC: 9.9 10*3/uL (ref 4.0–10.5)
nRBC: 0 % (ref 0.0–0.2)

## 2022-08-28 LAB — BASIC METABOLIC PANEL
Anion gap: 10 (ref 5–15)
BUN: 11 mg/dL (ref 8–23)
CO2: 25 mmol/L (ref 22–32)
Calcium: 9.1 mg/dL (ref 8.9–10.3)
Chloride: 101 mmol/L (ref 98–111)
Creatinine, Ser: 0.61 mg/dL (ref 0.44–1.00)
GFR, Estimated: >60 mL/min (ref >60)
Glucose, Bld: 121 mg/dL — ABNORMAL HIGH (ref 70–99)
Potassium: 4 mmol/L (ref 3.5–5.1)
Sodium: 136 mmol/L (ref 135–145)

## 2022-08-28 LAB — TROPONIN I (HIGH SENSITIVITY): Troponin I (High Sensitivity): 10 ng/L (ref <18)

## 2022-08-28 MED ORDER — METHYLPREDNISOLONE SODIUM SUCC 125 MG IJ SOLR
60.0000 mg | Freq: Every day | INTRAMUSCULAR | Status: DC
  Administered 2022-08-29 – 2022-09-01 (×4): 60 mg via INTRAVENOUS
  Filled 2022-08-28 (×4): qty 2

## 2022-08-28 MED ORDER — IPRATROPIUM-ALBUTEROL 0.5-2.5 (3) MG/3ML IN SOLN: 3.0000 mL | RESPIRATORY_TRACT | Status: DC | PRN

## 2022-08-28 MED ORDER — LACTATED RINGERS IV SOLN: INTRAVENOUS | Status: DC

## 2022-08-28 MED ORDER — IPRATROPIUM-ALBUTEROL 0.5-2.5 (3) MG/3ML IN SOLN
3.0000 mL | Freq: Once | RESPIRATORY_TRACT | Status: AC
  Administered 2022-08-28: 3 mL via RESPIRATORY_TRACT
  Filled 2022-08-28: qty 3

## 2022-08-28 MED ORDER — OSELTAMIVIR PHOSPHATE 30 MG PO CAPS
30.0000 mg | ORAL_CAPSULE | Freq: Two times a day (BID) | ORAL | Status: DC
  Administered 2022-08-28 – 2022-09-01 (×8): 30 mg via ORAL
  Filled 2022-08-28 (×10): qty 1

## 2022-08-28 MED ORDER — LORAZEPAM 0.5 MG PO TABS
0.5000 mg | ORAL_TABLET | Freq: Once | ORAL | Status: AC
  Administered 2022-08-29: 0.5 mg via ORAL
  Filled 2022-08-28: qty 1

## 2022-08-28 MED ORDER — MAGNESIUM SULFATE IN D5W 1-5 GM/100ML-% IV SOLN
1.0000 g | Freq: Once | INTRAVENOUS | Status: AC
  Administered 2022-08-28: 1 g via INTRAVENOUS
  Filled 2022-08-28: qty 100

## 2022-08-28 MED ORDER — ENOXAPARIN SODIUM 40 MG/0.4ML IJ SOSY
40.0000 mg | PREFILLED_SYRINGE | INTRAMUSCULAR | Status: DC
  Administered 2022-08-28 – 2022-08-31 (×4): 40 mg via SUBCUTANEOUS
  Filled 2022-08-28 (×4): qty 0.4

## 2022-08-28 MED ORDER — GUAIFENESIN-DM 100-10 MG/5ML PO SYRP
5.0000 mL | ORAL_SOLUTION | Freq: Every day | ORAL | Status: DC
  Administered 2022-08-28: 5 mL via ORAL
  Filled 2022-08-28: qty 5

## 2022-08-28 MED ORDER — METHYLPREDNISOLONE SODIUM SUCC 125 MG IJ SOLR
125.0000 mg | Freq: Once | INTRAMUSCULAR | Status: AC
  Administered 2022-08-28: 125 mg via INTRAVENOUS
  Filled 2022-08-28: qty 2

## 2022-08-28 NOTE — Telephone Encounter (Signed)
Juanito Doom, MD     When I saw her last week I recommended a hospitalization and she declined.   If she is still sick despite taking the prednisone and doxycycline then I recommend she go to the ER.    Called and spoke with pt letting her know recs per BQ and she verbalized understanding. Nothing further needed.

## 2022-08-28 NOTE — ED Provider Triage Note (Signed)
Emergency Medicine Provider Triage Evaluation Note  Lillyanna Glandon , a 71 y.o. female  was evaluated in triage.  Pt complains of shortness of breath which is been ongoing for over a week at this time.  Patient states he was evaluated primary care.  Notes showed diagnosis with COPD exacerbation at that time.  Patient states that she continues to feel unwell with chills, shortness of breath, cough.  She denies abdominal pain, nausea, vomiting.  She did feel like she needed to throw up but thinks it was due to an episode of coughing.  Review of Systems  Positive: As above Negative: As above  Physical Exam  BP (!) 153/139 (BP Location: Right Arm)   Pulse 90   Temp 98.4 F (36.9 C) (Oral)   Resp 19   SpO2 93%  Gen:   Awake, no distress   Resp:  Normal effort  MSK:   Moves extremities without difficulty  Other:    Medical Decision Making  Medically screening exam initiated at 2:36 PM.  Appropriate orders placed.  Raigan Baria was informed that the remainder of the evaluation will be completed by another provider, this initial triage assessment does not replace that evaluation, and the importance of remaining in the ED until their evaluation is complete.     Dorothyann Peng, PA-C 08/28/22 1437

## 2022-08-28 NOTE — Assessment & Plan Note (Signed)
-  continue statin and Zetia

## 2022-08-28 NOTE — Assessment & Plan Note (Addendum)
Continue Imdur and amlodipine

## 2022-08-28 NOTE — Assessment & Plan Note (Addendum)
Secondary to COPD exacerbation. Associated dyspnea on exertion. Attempted to wean oxygen, however, patient dropped to SpO2 of 84% with associated dyspnea. As hospitalization progressed, patient was successfully weaned to room air and did not qualify for oxygen during ambulatory oxygen testing.

## 2022-08-28 NOTE — ED Provider Notes (Signed)
[facility] EMERGENCY DEPARTMENT Provider Note   CSN: [id number] Arrival date & time: 08/28/22  1256     History  Chief Complaint  Patient presents with   Shortness of Breath   Cough    Katherine Foley is a 71 y.o. female.  HPI   Pt complains of shortness of breath which is been ongoing for over a week at this time.  Patient states he was evaluated primary care.  Notes showed diagnosis with COPD exacerbation at that time.  Patient states that she continues to feel unwell with chills, shortness of breath, cough.  She denies abdominal pain, nausea, vomiting.  She did feel like she needed to throw up but thinks it was due to an episode of coughing.   Patient had COVID and strep throat around Thanksgiving.  Symptoms did ultimately improve.  Current symptoms have been worsening for about a week and a half.  She was seen at pulmonology office and treated with a course of doxycycline and prednisone which she has just finished.  She continues to feel very short of breath with activity and has chest pain with cough.  No lower extremity swelling or calf pain.    Home Medications Prior to Admission medications   Medication Sig Start Date End Date Taking? Authorizing Provider  acetaminophen (TYLENOL) 650 MG CR tablet Take 1,300 mg by mouth every 8 (eight) hours as needed for pain.    [provider]  albuterol (VENTOLIN HFA) 108 (90 Base) MCG/ACT inhaler Inhale 2 puffs into the lungs every 6 (six) hours as needed for wheezing or shortness of breath. 12/14/21   Marin Olp, MD  amLODipine (NORVASC) 5 MG tablet TAKE ONE TABLET BY MOUTH ONCE DAILY 08/07/22   Marin Olp, MD  aspirin EC 81 MG tablet Take 1 tablet (81 mg total) by mouth daily. 06/17/19   Richardson Dopp T, PA-C  benzonatate (TESSALON) 200 MG capsule Take 1 capsule (200 mg total) by mouth 3 (three) times daily as needed for cough. 08/23/22   Juanito Doom, MD  Budeson-Glycopyrrol-Formoterol  (BREZTRI AEROSPHERE) 160-9-4.8 MCG/ACT AERO Inhale 2 puffs into the lungs in the morning and at bedtime. 08/23/22   Juanito Doom, MD  buPROPion (WELLBUTRIN XL) 300 MG 24 hr tablet TAKE ONE TABLET BY MOUTH ONCE DAILY 02/08/22   Marin Olp, MD  Calcium Carb-Cholecalciferol (CALCIUM 500/D PO) Take 2 tablets by mouth daily. Mag and zinc added    [provider]  cetirizine (ZYRTEC) 10 MG tablet Take 10 mg by mouth at bedtime.    [provider]  desvenlafaxine (PRISTIQ) 100 MG 24 hr tablet TAKE ONE TABLET BY MOUTH EVERY MORNING 02/08/22   Marin Olp, MD  dexlansoprazole (DEXILANT) 60 MG capsule TAKE ONE CAPSULE BY MOUTH ONCE DAILY 08/07/22   Marin Olp, MD  doxycycline (VIBRA-TABS) 100 MG tablet Take 1 tablet (100 mg total) by mouth 2 (two) times daily. 08/23/22   Juanito Doom, MD  ezetimibe (ZETIA) 10 MG tablet TAKE ONE TABLET BY MOUTH ONCE DAILY 08/07/22   Richardson Dopp T, PA-C  fluticasone (CUTIVATE) 0.05 % cream Apply 1 Application topically 2 (two) times daily as needed (eczema).    [provider]  fluticasone (FLONASE) 50 MCG/ACT nasal spray Place 2 sprays into both nostrils as needed for allergies or rhinitis. 11/22/21   Marin Olp, MD  gabapentin (NEURONTIN) 300 MG capsule TAKE ONE CAPSULE BY MOUTH EVERYDAY AT BEDTIME 11/13/21  Marin Olp, MD  isosorbide mononitrate (IMDUR) 30 MG 24 hr tablet Take 0.5 tablets (15 mg total) by mouth daily. 07/11/22   Weaver, Scott T, PA-C  LORazepam (ATIVAN) 0.5 MG tablet TAKE ONE TABLET BY MOUTH twice daily AS NEEDED FOR ANXIETY. Do not drive 8 hours after using. 04/10/22   Marin Olp, MD  Multiple Vitamins-Minerals (MULTIVITAMIN ADULT PO) Take 1 tablet by mouth daily.     [provider]  nitroGLYCERIN (NITROSTAT) 0.4 MG SL tablet Place 1 tablet (0.4 mg total) under the tongue every 5 (five) minutes as needed for chest pain (if pain does not resolve with 2 doses, call 911). 07/11/22    Richardson Dopp T, PA-C  predniSONE (DELTASONE) 20 MG tablet Take 1 tablet (20 mg total) by mouth daily with breakfast. Prednisone '20mg'$  x 5 days 08/23/22   Juanito Doom, MD  rosuvastatin (CRESTOR) 40 MG tablet Take 1 tablet (40 mg total) by mouth daily. 05/11/22   Marin Olp, MD  valACYclovir (VALTREX) 1000 MG tablet Take 1,000 mg by mouth daily as needed (for fever blisters).    [provider]  Aclidinium Bromide (TUDORZA PRESSAIR) 400 MCG/ACT AEPB Inhale 1 puff into the lungs 2 (two) times daily. 09/11/11 06/16/18  Ricard Dillon, MD      Allergies    Codeine sulfate and Hydrocodone-acetaminophen    Review of Systems   Review of Systems  Physical Exam Updated Vital Signs BP 127/81   Pulse 75   Temp 97.8 F (36.6 C) (Oral)   Resp 16   SpO2 93%  Physical Exam Constitutional:      Comments: Alert with clear mental status.  Mild/moderate increased work of breathing at rest.  Harsh paroxysmal cough.  HENT:     Mouth/Throat:     Mouth: Mucous membranes are dry.     Pharynx: Oropharynx is clear.  Eyes:     Extraocular Movements: Extraocular movements intact.  Cardiovascular:     Rate and Rhythm: Normal rate and regular rhythm.  Pulmonary:     Comments: Mild to moderate increased work of breathing at rest.  Sounds very soft at the bases.  Occasional crackle as well as wheeze. Abdominal:     General: There is no distension.     Palpations: Abdomen is soft.     Tenderness: There is no abdominal tenderness. There is no guarding.  Musculoskeletal:        General: No swelling or tenderness. Normal range of motion.     Right lower leg: No edema.     Left lower leg: No edema.  Skin:    General: Skin is warm and dry.  Neurological:     General: No focal deficit present.     Mental Status: She is oriented to person, place, and time.     Coordination: Coordination normal.  Psychiatric:        Mood and Affect: Mood normal.     ED Results / Procedures / Treatments    Labs (all labs ordered are listed, but only abnormal results are displayed) Labs Reviewed  RESP PANEL BY RT-PCR (RSV, FLU A&B, COVID)  RVPGX2 - Abnormal; Notable for the following components:      Result Value   Influenza A by PCR POSITIVE (*)    All other components within normal limits  BASIC METABOLIC PANEL - Abnormal; Notable for the following components:   Glucose, Bld 121 (*)    All other components within normal limits  CBC WITH DIFFERENTIAL/PLATELET - Abnormal; Notable for the following components:   Neutro Abs 8.6 (*)    All other components within normal limits  BRAIN NATRIURETIC PEPTIDE    EKG EKG Interpretation  Date/Time:  Tuesday August 28 2022 14:22:41 EST Ventricular Rate:  86 PR Interval:  164 QRS Duration: 74 QT Interval:  366 QTC Calculation: 437 R Axis:   79 Text Interpretation: Normal sinus rhythm T wave abnormality, consider lateral ischemia Abnormal ECG When compared with ECG of 31-May-2015 04:37, PREVIOUS ECG IS PRESENT agree, possible lateral ST depression compared to previous Confirmed by Charlesetta Shanks [phone]) on 08/28/2022 5:27:59 PM  Radiology DG Chest 2 View  Result Date: 08/28/2022 CLINICAL DATA:  Cough and shortness of breath for several weeks. EXAM: CHEST - 2 VIEW COMPARISON:  Chest x-ray dated August 20, 2022. FINDINGS: Normal heart size status post CABG and AVR. Normal mediastinal contours. Normal pulmonary vascularity. No focal consolidation, pleural effusion, or pneumothorax. Chronic scarring at the left costophrenic angle. No acute osseous abnormality. IMPRESSION: No active cardiopulmonary disease. Electronically Signed   By: Titus Dubin M.D.   On: 08/28/2022 15:29    Procedures Procedures    Medications Ordered in ED Medications  lactated ringers infusion ( Intravenous New Bag/Given 08/28/22 1801)  ipratropium-albuterol (DUONEB) 0.5-2.5 (3) MG/3ML nebulizer solution 3 mL (3 mLs Nebulization Given 08/28/22 1756)   methylPREDNISolone sodium succinate (SOLU-MEDROL) 125 mg/2 mL injection 125 mg (125 mg Intravenous Given 08/28/22 1754)    ED Course/ Medical Decision Making/ A&P                           Medical Decision Making Risk Prescription drug management. Decision regarding hospitalization.  Patient has had worsening cough over a week and a half duration now with significant shortness of breath.  She does not wear oxygen at home at baseline.  Patient is hypoxic to the low 90s with visible increased work of breathing.  She had outpatient treatment with doxycycline and prednisone by pulmonology.  Despite this, she is having worsening symptoms.  Review of EMR indicates severe baseline COPD.  Differential diagnosis includes respiratory viral illness\bacterial pneumonia\COPD exacerbation\pneumothorax\pulmonary embolus\ACS.  Will proceed with diagnostic workup and start nebulizer therapy and Solu-Medrol IV.  Chest x-ray reviewed by radiology no significant acute findings.  EKG reviewed by myself some questionable lateral ST depression.  Patient's presentation is atypical for ACS.  She does test positive for influenza A.  With some risk factors of age will add troponin to rule out ACS.  Blood pressures and heart rate are stable.  Persistent hypoxia and exacerbation of COPD with acute influenza A, will plan for admission.  Chat message to Dr. Lake Bells, patient's outpatient pulmonologist, he agrees with this plan.  Consult: Reviewed with Dr. Carleene Mains hospitalist for admission        Final Clinical Impression(s) / ED Diagnoses Final diagnoses:  COPD exacerbation ([place])  Hypoxia  Influenza A    Rx / DC Orders ED Discharge Orders     None         Charlesetta Shanks, MD 08/28/22 [phone]

## 2022-08-28 NOTE — Assessment & Plan Note (Addendum)
Continue aspirin and Imdur.

## 2022-08-28 NOTE — H&P (Signed)
History and Physical    Patient: Katherine Foley QZR:[id number] DOB: 11-Jan-1951 DOA: 08/28/2022 DOS: the patient was seen and examined on 08/28/2022 PCP: Marin Olp, MD  Patient coming from: Home  Chief Complaint:  Chief Complaint  Patient presents with   Shortness of Breath   Cough   HPI: Katherine Foley is a 71 y.o. female with medical history significant of CAD s/p PCI RCA and RI and subsequent CABG, Aortic stenosis s/p TAVR, HTN, HLD, COPD, anxiety and depression who presents with increasing shortness of breath.   She has been having worsening shortness of breath for last few months. Recently treated for COVID and strep throat around Thanksgiving. Also had RHC to evaluate her dyspnea which did not show any significant worsening of her known CAD and was advised medical management by cardiology. She presented to pulmonology Dr. Lake Bells on 08/23/22 and was started on prednisone and doxycycline for COPD exacerbation. Today she called pulmonology with persistent symptoms and advised to present to ED.   Has been feeling weak, short of breath and with frequent cough.   In the ED, she was afebrile, normotensive but tachypnic and started on 2L via [place]. CBC without leukocytosis or anemia. BMP unremarkable.  Flu A was positive.     Review of Systems: As mentioned in the history of present illness. All other systems reviewed and are negative. Past Medical History:  Diagnosis Date   Adenomatous colon polyp    Anxiety    Anxiety/Depression 02/15/2009   Aortic stenosis s/p bioprosthetic AVR 07/07/2010   Echo 9/19:  Mild conc LVH, EF 60-65, no RWMA, Gr 1 DD, AVR ok (mean 10, peak 21), trivial MR, normal RVSF, mild TR // Echo 10/22: EF 60-65, no RWMA, normal RVSF, AVR with increased gradients (mean 24 increased from 10 in 2019) // Echocardiogram 10/23: EF 60-65, no RWMA, mildly reduced RVSF, AVR w mean gradient 17.3 mmHg   Barrett's esophagus    04/2007; 2000   Carotid stenosis  05/17/2010   a. dopplers 35/45: RICA 62-56%; LICA 3-89% (follow up due 11/12)   COPD 07/23/2007   Coronary Artery Disease 05/17/2010   NSTEMI 8/11 - BMS to RCA;  cath 4/12: dLAD 50%, RI 80% (PCI), AVCFX 30%, pRCA 30%, stent ok, 40-50, then 50% dist RCA;  c. s/p Promus DES to RI 12/25/10;  d. 04/2011 CABG x 1 (LIMA->LAD) @ time of AVR // Cath in 9/19: RI and RCA stents ok; L-LAD atretic, mild disease in mLAD >> med Rx   Family history of breast cancer    2 daughters; PALB2 mutation in family   Genetic testing 02/10/2018   PALB2 analysis at [facility] - Negative for familial mutation   GERD    H/O hiatal hernia    Hemorrhoid thrombosis    "had it lanced" (03/25/2013)   Herpes simplex without mention of complication 37/34/2876   History of blood transfusion    'w/OHS" (03/25/2013)   Hyperlipidemia 08/29/2009   Hypertension 04/14/2007   Migraine HAs 04/20/2008   Myocardial infarction [facility]) 2011   "during catherization" (03/25/2013)   OSTEOPENIA 04/14/2007   Ovarian cyst    Parotid mass    left   Pneumonia    "multiple times" (03/25/2013)   Urine, incontinence, stress female    Past Surgical History:  Procedure Laterality Date   AORTIC VALVE REPLACEMENT  2012   APPENDECTOMY     BLEPHAROPLASTY Bilateral ~ [place] N/A 07/06/2016   Procedure: Left Heart Cath and  Cors/Grafts Angiography;  Surgeon: Sherren Mocha, MD;  Location: [place] CV LAB;  Service: Cardiovascular;  Laterality: N/A;   CHOLECYSTECTOMY N/A 03/25/2013   Procedure: LAPAROSCOPIC CHOLECYSTECTOMY WITH INTRAOPERATIVE CHOLANGIOGRAM;  Surgeon: Imogene Burn. Georgette Dover, MD;  Location: [place];  Service: General;  Laterality: N/A;   CORONARY ANGIOGRAPHY N/A 05/26/2018   Procedure: CORONARY ANGIOGRAPHY (CATH LAB);  Surgeon: Jettie Booze, MD;  Location: [place] [facility];  Service: Cardiovascular;  Laterality: N/A;   CORONARY ANGIOPLASTY WITH STENT PLACEMENT  2010   "I have 2 stents" (03/25/2013)   [place]; 1998   "cust in maxillary sinus; put a stent in then removed it" (03/25/2013)   ORIF ANKLE FRACTURE Right 02/14/2021   Procedure: OPEN TREATMENT OF RIGHT TRIMALLEOLAR ANKLE FRACTURE WITHOUT POSTERIOR FIXATION, SYNDESMOSIS;  Surgeon: Erle Crocker, MD;  Location: [place];  Service: Orthopedics;  Laterality: Right;   OVARIAN CYST REMOVAL Right 2011   "size of a soccer ball" (03/25/2013)   PAROTIDECTOMY Left 04/28/2019   Procedure: LEFT TOTAL PAROTIDECTOMY;  Surgeon: Leta Baptist, MD;  Location: [place];  Service: ENT;  Laterality: Left;   RIGHT HEART CATH AND CORONARY ANGIOGRAPHY N/A 07/23/2022   Procedure: RIGHT HEART CATH AND CORONARY ANGIOGRAPHY;  Surgeon: Burnell Blanks, MD;  Location: [place] [facility];  Service: Cardiovascular;  Laterality: N/A;   thrombosed hemorrohid lanced     TONSILLECTOMY AND ADENOIDECTOMY     TOTAL ABDOMINAL HYSTERECTOMY     Social History:  reports that she quit smoking about 16 years ago. Her smoking use included cigarettes. She has a 66.00 pack-year smoking history. She has never used smokeless tobacco. She reports that she does not currently use alcohol. She reports that she does not use drugs.  Allergies  Allergen Reactions   Codeine Sulfate Itching and Rash   Hydrocodone-Acetaminophen Itching and Rash    Family History  Problem Relation Age of Onset   Coronary artery disease Other    Heart disease Other        6 stents   Heart disease Mother    COPD Father        smoked   Heart disease Father    Rheum arthritis Father    Heart disease Maternal Uncle    Breast cancer Daughter        currently 30   Asthma Son        as a child    Breast cancer Daughter 15       currently 22; PALB2 mutation   Cancer Other        very distant paternal cousin; pancreatic ca   Cirrhosis Sister    Stroke Brother    Colon cancer Neg Hx    Colon polyps Neg Hx     Prior to Admission medications    Medication Sig Start Date End Date Taking? Authorizing Provider  acetaminophen (TYLENOL) 650 MG CR tablet Take 1,300 mg by mouth every 8 (eight) hours as needed for pain.    [provider]  albuterol (VENTOLIN HFA) 108 (90 Base) MCG/ACT inhaler Inhale 2 puffs into the lungs every 6 (six) hours as needed for wheezing or shortness of breath. 12/14/21   Marin Olp, MD  amLODipine (NORVASC) 5 MG tablet TAKE ONE TABLET BY MOUTH ONCE DAILY 08/07/22   Marin Olp, MD  aspirin EC 81 MG tablet Take 1 tablet (81 mg total) by mouth daily. 06/17/19   Liliane Shi,  PA-C  benzonatate (TESSALON) 200 MG capsule Take 1 capsule (200 mg total) by mouth 3 (three) times daily as needed for cough. 08/23/22   Juanito Doom, MD  Budeson-Glycopyrrol-Formoterol (BREZTRI AEROSPHERE) 160-9-4.8 MCG/ACT AERO Inhale 2 puffs into the lungs in the morning and at bedtime. 08/23/22   Juanito Doom, MD  buPROPion (WELLBUTRIN XL) 300 MG 24 hr tablet TAKE ONE TABLET BY MOUTH ONCE DAILY 02/08/22   Marin Olp, MD  Calcium Carb-Cholecalciferol (CALCIUM 500/D PO) Take 2 tablets by mouth daily. Mag and zinc added    [provider]  cetirizine (ZYRTEC) 10 MG tablet Take 10 mg by mouth at bedtime.    [provider]  desvenlafaxine (PRISTIQ) 100 MG 24 hr tablet TAKE ONE TABLET BY MOUTH EVERY MORNING 02/08/22   Marin Olp, MD  dexlansoprazole (DEXILANT) 60 MG capsule TAKE ONE CAPSULE BY MOUTH ONCE DAILY 08/07/22   Marin Olp, MD  doxycycline (VIBRA-TABS) 100 MG tablet Take 1 tablet (100 mg total) by mouth 2 (two) times daily. 08/23/22   Juanito Doom, MD  ezetimibe (ZETIA) 10 MG tablet TAKE ONE TABLET BY MOUTH ONCE DAILY 08/07/22   Richardson Dopp T, PA-C  fluticasone (CUTIVATE) 0.05 % cream Apply 1 Application topically 2 (two) times daily as needed (eczema).    [provider]  fluticasone (FLONASE) 50 MCG/ACT nasal spray Place 2 sprays into both nostrils as  needed for allergies or rhinitis. 11/22/21   Marin Olp, MD  gabapentin (NEURONTIN) 300 MG capsule TAKE ONE CAPSULE BY MOUTH EVERYDAY AT BEDTIME 11/13/21   Marin Olp, MD  isosorbide mononitrate (IMDUR) 30 MG 24 hr tablet Take 0.5 tablets (15 mg total) by mouth daily. 07/11/22   Weaver, Scott T, PA-C  LORazepam (ATIVAN) 0.5 MG tablet TAKE ONE TABLET BY MOUTH twice daily AS NEEDED FOR ANXIETY. Do not drive 8 hours after using. 04/10/22   Marin Olp, MD  Multiple Vitamins-Minerals (MULTIVITAMIN ADULT PO) Take 1 tablet by mouth daily.     [provider]  nitroGLYCERIN (NITROSTAT) 0.4 MG SL tablet Place 1 tablet (0.4 mg total) under the tongue every 5 (five) minutes as needed for chest pain (if pain does not resolve with 2 doses, call 911). 07/11/22   Richardson Dopp T, PA-C  predniSONE (DELTASONE) 20 MG tablet Take 1 tablet (20 mg total) by mouth daily with breakfast. Prednisone 63m x 5 days 08/23/22   MJuanito Doom MD  rosuvastatin (CRESTOR) 40 MG tablet Take 1 tablet (40 mg total) by mouth daily. 05/11/22   HMarin Olp MD  valACYclovir (VALTREX) 1000 MG tablet Take 1,000 mg by mouth daily as needed (for fever blisters).    [provider]  Aclidinium Bromide (TUDORZA PRESSAIR) 400 MCG/ACT AEPB Inhale 1 puff into the lungs 2 (two) times daily. 09/11/11 06/16/18  JRicard Dillon MD    Physical Exam: Vitals:   08/28/22 1745 08/28/22 1815 08/28/22 1830 08/28/22 1915  BP: 106/62 124/76 135/61 127/81  Pulse: 71 68 74 75  Resp: 13 (!) 31 (!) 21 16  Temp:      TempSrc:      SpO2: 94% 92% 91% 93%   Constitutional: NAD, calm, comfortable, thin elderly female laying in bed with labored respirations Eyes:  lids and conjunctivae normal ENMT: Mucous membranes are moist.  Neck: normal, supple Respiratory: Bibasilar crackles.  Normal respiratory effort on 2 L. No accessory muscle use.  Cardiovascular: Regular rate and rhythm, no murmurs /  rubs / gallops. No  extremity edema.  Abdomen: no tenderness,  Bowel sounds positive.  Musculoskeletal: no clubbing / cyanosis. No joint deformity upper and lower extremities. Good ROM, no contractures. Normal muscle tone.  Skin: no rashes, lesions, ulcers. No induration Neurologic: CN 2-12 grossly intact.  Strength 5/5 in all 4.  Psychiatric: Normal judgment and insight. Alert and oriented x 3. Normal mood. Data Reviewed:  See HPI  Assessment and Plan: * Acute respiratory failure with hypoxia ([place]) -Secondary to COPD exacerbation in the setting of influenza A -requiring 2L via [place]. Wean as tolerated with goal O2 >92% -Give IV Mag x 1 -PRN duoneb q4hr -daily IV solu medrol -pt had symptoms for more than a week but since she is high risk for complication will start oseltamivir    Coronary artery disease involving native coronary artery with angina pectoris (HCC) -continue aspirin, Imdur and antihypertensives  Essential hypertension -continue amlodipine  HLD (hyperlipidemia) -continue statin and Zetia      Advance Care Planning:   Code Status: Full Code   Consults: none  Family Communication: none at bedside  Severity of Illness: The appropriate patient status for this patient is INPATIENT. Inpatient status is judged to be reasonable and necessary in order to provide the required intensity of service to ensure the patient's safety. The patient's presenting symptoms, physical exam findings, and initial radiographic and laboratory data in the context of their chronic comorbidities is felt to place them at high risk for further clinical deterioration. Furthermore, it is not anticipated that the patient will be medically stable for discharge from the hospital within 2 midnights of admission.   * I certify that at the point of admission it is my clinical judgment that the patient will require inpatient hospital care spanning beyond 2 midnights from the point of admission due to high intensity of  service, high risk for further deterioration and high frequency of surveillance required.*  Author: Orene Desanctis, DO 08/28/2022 8:43 PM  For on call review [email or web address].

## 2022-08-28 NOTE — Telephone Encounter (Signed)
Called and spoke with pt who states she is not feeling any better after OV she had with BQ 12/21. States she will be finishing up both the doxy and pred today.  Pt said she is still coughing but not getting up any phlegm.  Pt has been taking mucinex and using her breztri inhaler as prescribed.  Asked pt if she had had to use her albuterol inhaler any and she said no as she stated that she did not know that she was allowed to use that inhaler. I made pt aware that her albuterol inhaler was her rescue inhaler that she could use every 6 hours as needed for SOB, wheezing, chest tightness.  Along with coughing, pt has had increased SOB, is also wheezing, and has had O2 sats ranging from 88-90%. Pt is not on any O2.  Dr. Lake Bells, please advise on this what you recommend.  **No openings any this week with any provider**

## 2022-08-28 NOTE — ED Triage Notes (Signed)
Pt reports SOB, cough, knot on right neck. Pt reports having covid and strep during Thanksgiving. Pt SOB without any exertion.

## 2022-08-29 ENCOUNTER — Encounter (HOSPITAL_COMMUNITY): Payer: Self-pay | Admitting: Family Medicine

## 2022-08-29 DIAGNOSIS — I10 Essential (primary) hypertension: Secondary | ICD-10-CM | POA: Diagnosis not present

## 2022-08-29 DIAGNOSIS — J9601 Acute respiratory failure with hypoxia: Secondary | ICD-10-CM | POA: Diagnosis not present

## 2022-08-29 DIAGNOSIS — J441 Chronic obstructive pulmonary disease with (acute) exacerbation: Secondary | ICD-10-CM | POA: Diagnosis not present

## 2022-08-29 DIAGNOSIS — E785 Hyperlipidemia, unspecified: Secondary | ICD-10-CM

## 2022-08-29 LAB — BASIC METABOLIC PANEL
Anion gap: 6 (ref 5–15)
BUN: 9 mg/dL (ref 8–23)
CO2: 28 mmol/L (ref 22–32)
Calcium: 8.7 mg/dL — ABNORMAL LOW (ref 8.9–10.3)
Chloride: 103 mmol/L (ref 98–111)
Creatinine, Ser: 0.58 mg/dL (ref 0.44–1.00)
GFR, Estimated: >60 mL/min (ref >60)
Glucose, Bld: 143 mg/dL — ABNORMAL HIGH (ref 70–99)
Potassium: 3.8 mmol/L (ref 3.5–5.1)
Sodium: 137 mmol/L (ref 135–145)

## 2022-08-29 LAB — TROPONIN I (HIGH SENSITIVITY): Troponin I (High Sensitivity): 7 ng/L (ref <18)

## 2022-08-29 LAB — BRAIN NATRIURETIC PEPTIDE: B Natriuretic Peptide: 114.7 pg/mL — ABNORMAL HIGH (ref 0.0–100.0)

## 2022-08-29 MED ORDER — LORATADINE 10 MG PO TABS
10.0000 mg | ORAL_TABLET | Freq: Every day | ORAL | Status: DC
  Administered 2022-08-29 – 2022-09-01 (×4): 10 mg via ORAL
  Filled 2022-08-29 (×4): qty 1

## 2022-08-29 MED ORDER — UMECLIDINIUM BROMIDE 62.5 MCG/ACT IN AEPB
1.0000 | INHALATION_SPRAY | Freq: Every day | RESPIRATORY_TRACT | Status: DC
  Administered 2022-08-29 – 2022-09-01 (×4): 1 via RESPIRATORY_TRACT
  Filled 2022-08-29: qty 7

## 2022-08-29 MED ORDER — TRAMADOL HCL 50 MG PO TABS: 50.0000 mg | ORAL_TABLET | Freq: Two times a day (BID) | ORAL | Status: DC | PRN

## 2022-08-29 MED ORDER — ROSUVASTATIN CALCIUM 20 MG PO TABS
40.0000 mg | ORAL_TABLET | Freq: Every day | ORAL | Status: DC
  Administered 2022-08-29 – 2022-09-01 (×4): 40 mg via ORAL
  Filled 2022-08-29 (×4): qty 2

## 2022-08-29 MED ORDER — PANTOPRAZOLE SODIUM 40 MG PO TBEC
40.0000 mg | DELAYED_RELEASE_TABLET | Freq: Every day | ORAL | Status: DC
  Administered 2022-08-29 – 2022-09-01 (×4): 40 mg via ORAL
  Filled 2022-08-29 (×4): qty 1

## 2022-08-29 MED ORDER — ASPIRIN 81 MG PO TBEC
81.0000 mg | DELAYED_RELEASE_TABLET | Freq: Every day | ORAL | Status: DC
  Administered 2022-08-29 – 2022-09-01 (×4): 81 mg via ORAL
  Filled 2022-08-29 (×4): qty 1

## 2022-08-29 MED ORDER — BUDESON-GLYCOPYRROL-FORMOTEROL 160-9-4.8 MCG/ACT IN AERO: 2.0000 | INHALATION_SPRAY | Freq: Two times a day (BID) | RESPIRATORY_TRACT | Status: DC

## 2022-08-29 MED ORDER — MOMETASONE FURO-FORMOTEROL FUM 200-5 MCG/ACT IN AERO
2.0000 | INHALATION_SPRAY | Freq: Two times a day (BID) | RESPIRATORY_TRACT | Status: DC
  Administered 2022-08-29 – 2022-09-01 (×7): 2 via RESPIRATORY_TRACT
  Filled 2022-08-29: qty 8.8

## 2022-08-29 MED ORDER — ACETAMINOPHEN 325 MG PO TABS
650.0000 mg | ORAL_TABLET | ORAL | Status: DC | PRN
  Administered 2022-08-29 – 2022-08-31 (×3): 650 mg via ORAL
  Filled 2022-08-29 (×3): qty 2

## 2022-08-29 MED ORDER — ISOSORBIDE MONONITRATE ER 30 MG PO TB24
15.0000 mg | ORAL_TABLET | Freq: Every day | ORAL | Status: DC
  Administered 2022-08-29 – 2022-09-01 (×4): 15 mg via ORAL
  Filled 2022-08-29 (×4): qty 1

## 2022-08-29 MED ORDER — DOXYCYCLINE HYCLATE 100 MG PO TABS
100.0000 mg | ORAL_TABLET | Freq: Two times a day (BID) | ORAL | Status: DC
  Administered 2022-08-29 – 2022-09-01 (×6): 100 mg via ORAL
  Filled 2022-08-29 (×6): qty 1

## 2022-08-29 MED ORDER — AMLODIPINE BESYLATE 5 MG PO TABS
5.0000 mg | ORAL_TABLET | Freq: Every day | ORAL | Status: DC
  Administered 2022-08-29 – 2022-09-01 (×3): 5 mg via ORAL
  Filled 2022-08-29 (×4): qty 1

## 2022-08-29 MED ORDER — GUAIFENESIN-DM 100-10 MG/5ML PO SYRP
10.0000 mL | ORAL_SOLUTION | Freq: Every day | ORAL | Status: DC
  Administered 2022-08-29 – 2022-08-31 (×3): 10 mL via ORAL
  Filled 2022-08-29 (×3): qty 10

## 2022-08-29 MED ORDER — VENLAFAXINE HCL ER 75 MG PO CP24
150.0000 mg | ORAL_CAPSULE | Freq: Every day | ORAL | Status: DC
  Administered 2022-08-29 – 2022-09-01 (×4): 150 mg via ORAL
  Filled 2022-08-29 (×4): qty 2

## 2022-08-29 MED ORDER — BUPROPION HCL ER (XL) 300 MG PO TB24
300.0000 mg | ORAL_TABLET | Freq: Every day | ORAL | Status: DC
  Administered 2022-08-29 – 2022-09-01 (×4): 300 mg via ORAL
  Filled 2022-08-29 (×4): qty 1

## 2022-08-29 MED ORDER — EZETIMIBE 10 MG PO TABS
10.0000 mg | ORAL_TABLET | Freq: Every day | ORAL | Status: DC
  Administered 2022-08-29 – 2022-09-01 (×4): 10 mg via ORAL
  Filled 2022-08-29 (×4): qty 1

## 2022-08-29 MED ORDER — GABAPENTIN 300 MG PO CAPS
300.0000 mg | ORAL_CAPSULE | Freq: Every day | ORAL | Status: DC
  Administered 2022-08-29 – 2022-08-31 (×3): 300 mg via ORAL
  Filled 2022-08-29 (×3): qty 1

## 2022-08-29 MED ORDER — LORAZEPAM 0.5 MG PO TABS
0.5000 mg | ORAL_TABLET | Freq: Once | ORAL | Status: AC | PRN
  Administered 2022-08-29: 0.5 mg via ORAL
  Filled 2022-08-29: qty 1

## 2022-08-29 NOTE — ED Notes (Signed)
Patient is resting comfortably. 

## 2022-08-29 NOTE — ED Notes (Signed)
Pt ambulated to BR with steady gait.

## 2022-08-29 NOTE — Progress Notes (Signed)
  Transition of Care [facility]) Screening Note   Patient Details  Name: Loma Dubuque Date of Birth: 1951/06/29   Transition of Care [facility]) CM/SW Contact:    Cyndi Bender, RN Phone Number: 08/29/2022, 9:31 AM    Transition of Care Department [facility]) has reviewed patient and no TOC needs have been identified at this time. We will continue to monitor patient advancement through interdisciplinary progression rounds. If new patient transition needs arise, please place a TOC consult.

## 2022-08-29 NOTE — Progress Notes (Signed)
[facility]                                                                               Katherine Foley, is a 71 y.o. female, DOB - 1950-12-25, BWI:[id number] Admit date - 08/28/2022    Outpatient Primary MD for the patient is Marin Olp, MD  LOS - 1  days    Brief summary   Katherine Foley is a 71 y.o. female with medical history significant of CAD s/p PCI RCA and RI and subsequent CABG, Aortic stenosis s/p TAVR, HTN, HLD, COPD, anxiety and depression who presents with increasing shortness of breath.    She has been having worsening shortness of breath for last few months. Recently treated for COVID and strep throat around Thanksgiving. Also had [facility] to evaluate her dyspnea which did not show any significant worsening of her known CAD and was advised medical management by cardiology. She presented to pulmonology Dr. Lake Bells on 08/23/22 and was started on prednisone and doxycycline for COPD exacerbation. Today she called pulmonology with persistent symptoms and advised to present to ED.    Assessment & Plan    Assessment and Plan: * Acute respiratory failure with hypoxia (HCC) Probably secondary to acute copd exacerbation and recent influenza A infection.  Wbc count wnl. CXR does not show any pneumonia.  [place] oxygen to keep sats greater than 90%.  Continue with duonebs and IV solumedrol. Wheezing has improved.  Tamiflu for influenza a infection.  Continue with dulera and incruse.  Doxycycline for acute bronchitis.  Symptomatic management with cough meds .      Coronary artery disease involving native coronary artery with angina pectoris ([place]) -continue aspirin, Imdur and antihypertensives She denies any chest pain. Troponins negative.   Essential hypertension - well controlled.  -continue amlodipine  HLD (hyperlipidemia) -continue statin and Zetia  Therapy evaluations ordered.    Estimated body mass index is 23.12 kg/m as calculated from the  following:   Height as of 08/23/22: '5\' 2"'$  (1.575 m).   Weight as of 08/23/22: 57.3 kg.  Code Status: full code.  DVT Prophylaxis:  enoxaparin (LOVENOX) injection 40 mg Start: 08/28/22 2030   Level of Care: Level of care: Telemetry Medical Family Communication: none at bedside.   Disposition Plan:     Remains inpatient appropriate:  IV steroids,   Procedures:  None.   Consultants:   None.   Antimicrobials:   Anti-infectives (From admission, onward)    Start     Dose/Rate Route Frequency Ordered Stop   08/28/22 2200  oseltamivir (TAMIFLU) capsule 30 mg        30 mg Oral 2 times daily 08/28/22 1952 09/02/22 2159        Medications  Scheduled Meds:  amLODipine  5 mg Oral Daily   aspirin EC  81 mg Oral Daily   buPROPion  300 mg Oral Daily   enoxaparin (LOVENOX) injection  40 mg Subcutaneous Q24H   ezetimibe  10 mg Oral Daily   gabapentin  300 mg Oral QHS   guaiFENesin-dextromethorphan  10 mL Oral QHS   isosorbide mononitrate  15 mg Oral Daily   loratadine  10 mg  Oral Daily   methylPREDNISolone (SOLU-MEDROL) injection  60 mg Intravenous Daily   mometasone-formoterol  2 puff Inhalation BID   And   umeclidinium bromide  1 puff Inhalation Daily   oseltamivir  30 mg Oral BID   pantoprazole  40 mg Oral Daily   rosuvastatin  40 mg Oral Daily   venlafaxine XR  150 mg Oral Q breakfast   Continuous Infusions: PRN Meds:.acetaminophen, ipratropium-albuterol, traMADol    Subjective:   Katherine Foley was seen and examined today. Breathing is the same, denies any chest pain .    Objective:   Vitals:   08/29/22 0600 08/29/22 0800 08/29/22 0841 08/29/22 0900  BP: 120/67 134/75 138/83 (!) 121/57  Pulse: 66 73 78 71  Resp: '15 16 13 16  '$ Temp:   98.2 F (36.8 C)   TempSrc:   Oral   SpO2: 95% 93% 95% 97%    Intake/Output Summary (Last 24 hours) at 08/29/2022 1607 Last data filed at 08/28/2022 2328 Gross per 24 hour  Intake --  Output 800 ml  Net -800 ml   There were  no vitals filed for this visit.   Exam General: Alert and oriented x 3, NAD Cardiovascular:  S1s2, RRR, no JVD.  Respiratory: bilateral scattered wheezing both anteriorly and posteriorly.  Gastrointestinal: Soft, nontender, nondistended, + bowel sounds Ext: no pedal edema bilaterally Neuro: AAOx3, Cr N's II- XII.  Skin: No rashes Psych: Normal affect    Data Reviewed:  I have personally reviewed following labs and imaging studies   CBC Lab Results  Component Value Date   WBC 9.9 08/28/2022   RBC 3.94 08/28/2022   HGB 12.3 08/28/2022   HCT 37.1 08/28/2022   MCV 94.2 08/28/2022   MCH 31.2 08/28/2022   PLT 240 08/28/2022   MCHC 33.2 08/28/2022   RDW 13.6 08/28/2022   LYMPHSABS 0.7 08/28/2022   MONOABS 0.4 08/28/2022   EOSABS 0.1 08/28/2022   BASOSABS 0.0 60/45/4098     Last metabolic panel Lab Results  Component Value Date   NA 137 08/29/2022   K 3.8 08/29/2022   CL 103 08/29/2022   CO2 28 08/29/2022   BUN 9 08/29/2022   CREATININE 0.58 08/29/2022   GLUCOSE 143 (H) 08/29/2022   GFRNONAA >60 08/29/2022   GFRAA 109 07/01/2019   CALCIUM 8.7 (L) 08/29/2022   PROT 6.3 03/05/2022   ALBUMIN 4.2 03/05/2022   LABGLOB 2.3 07/01/2019   AGRATIO 1.8 07/01/2019   BILITOT 0.4 03/05/2022   ALKPHOS 48 03/05/2022   AST 35 03/05/2022   ALT 32 03/05/2022   ANIONGAP 6 08/29/2022    CBG (last 3)  No results for input(s): "GLUCAP" in the last 72 hours.    Coagulation Profile: No results for input(s): "INR", "PROTIME" in the last 168 hours.   Radiology Studies: DG Chest 2 View  Result Date: 08/28/2022 CLINICAL DATA:  Cough and shortness of breath for several weeks. EXAM: CHEST - 2 VIEW COMPARISON:  Chest x-ray dated August 20, 2022. FINDINGS: Normal heart size status post CABG and AVR. Normal mediastinal contours. Normal pulmonary vascularity. No focal consolidation, pleural effusion, or pneumothorax. Chronic scarring at the left costophrenic angle. No acute osseous  abnormality. IMPRESSION: No active cardiopulmonary disease. Electronically Signed   By: Titus Dubin M.D.   On: 08/28/2022 15:29       Hosie Poisson M.D. [facility] 08/29/2022, 4:07 PM  Available via Epic secure chat 7am-7pm After 7 pm, please refer to night coverage provider listed on  amion.

## 2022-08-30 DIAGNOSIS — I10 Essential (primary) hypertension: Secondary | ICD-10-CM | POA: Diagnosis not present

## 2022-08-30 DIAGNOSIS — I25119 Atherosclerotic heart disease of native coronary artery with unspecified angina pectoris: Secondary | ICD-10-CM | POA: Diagnosis not present

## 2022-08-30 DIAGNOSIS — J9601 Acute respiratory failure with hypoxia: Secondary | ICD-10-CM | POA: Diagnosis not present

## 2022-08-30 DIAGNOSIS — J441 Chronic obstructive pulmonary disease with (acute) exacerbation: Secondary | ICD-10-CM | POA: Diagnosis not present

## 2022-08-30 LAB — STREP PNEUMONIAE URINARY ANTIGEN: Strep Pneumo Urinary Antigen: NEGATIVE

## 2022-08-30 MED ORDER — ENSURE ENLIVE PO LIQD
237.0000 mL | Freq: Two times a day (BID) | ORAL | Status: DC
  Administered 2022-08-30: 237 mL via ORAL

## 2022-08-30 MED ORDER — ADULT MULTIVITAMIN W/MINERALS CH
1.0000 | ORAL_TABLET | Freq: Every day | ORAL | Status: DC
  Administered 2022-08-30 – 2022-09-01 (×3): 1 via ORAL
  Filled 2022-08-30 (×3): qty 1

## 2022-08-30 MED ORDER — LORAZEPAM 0.5 MG PO TABS
0.5000 mg | ORAL_TABLET | Freq: Two times a day (BID) | ORAL | Status: DC | PRN
  Administered 2022-08-30 – 2022-09-01 (×3): 0.5 mg via ORAL
  Filled 2022-08-30 (×3): qty 1

## 2022-08-30 NOTE — Progress Notes (Signed)
OT Cancellation Note  Patient Details Name: Katherine Foley MRN: [id number] DOB: 1951-04-22   Cancelled Treatment:    Reason Eval/Treat Not Completed: Other (comment).  Patient currently eating, OT to stop back in 15-20 min  Lafayette Dunlevy D [name] 08/30/2022, 1:59 PM 08/30/2022  RP, OTR/L  Acute Rehabilitation Services  Office:  [phone]

## 2022-08-30 NOTE — Evaluation (Signed)
Occupational Therapy Evaluation Patient Details Name: Katherine Foley MRN: [id number] DOB: Jul 06, 1951 Today's Date: 08/30/2022   History of Present Illness Pt is 71 year old presented to [facility] on  08/28/22 with weakness and SOB. Pt found to have acute respiratory failure with hypoxia secondary to COPD exacerbation in the setting of influenza A. PMH - CAD, PCI, CABG, aortic stenosis, TAVR, HTN, COPD, anxiety/depression.   Clinical Impression   Patient admitted for the diagnosis above.  PTA she lives with a friend, needs no assist with ADL/iADL or mobility, and continues to work part time.  Deficit is difficulty maintaining O2 saturations on RA.  Patient left on 3L with sats consistently at 95%  No acute OT is indicated, and no post acute OT is anticipated.        Recommendations for follow up therapy are one component of a multi-disciplinary discharge planning process, led by the attending physician.  Recommendations may be updated based on patient status, additional functional criteria and insurance authorization.   Follow Up Recommendations  No OT follow up     Assistance Recommended at Discharge None  Patient can return home with the following Assist for transportation    Functional Status Assessment  Patient has not had a recent decline in their functional status  Equipment Recommendations  None recommended by OT    Recommendations for Other Services       Precautions / Restrictions Precautions Precaution Comments: O2 sats Restrictions Weight Bearing Restrictions: No      Mobility Bed Mobility Overal bed mobility: Independent                  Transfers Overall transfer level: Independent                        Balance Overall balance assessment: No apparent balance deficits (not formally assessed)                                         ADL either performed or assessed with clinical judgement   ADL Overall ADL's : At  baseline                                       General ADL Comments: Min cues for rest breaks     Vision Baseline Vision/History: 0 No visual deficits       Perception     Praxis      Pertinent Vitals/Pain Pain Assessment Pain Assessment: No/denies pain     Hand Dominance Right   Extremity/Trunk Assessment Upper Extremity Assessment Upper Extremity Assessment: Overall WFL for tasks assessed   Lower Extremity Assessment Lower Extremity Assessment: Defer to PT evaluation   Cervical / Trunk Assessment Cervical / Trunk Assessment: Normal   Communication Communication Communication: No difficulties   Cognition Arousal/Alertness: Awake/alert Behavior During Therapy: WFL for tasks assessed/performed Overall Cognitive Status: Within Functional Limits for tasks assessed                                       General Comments   VSS on 3L    Exercises     Shoulder Instructions      Home Living Family/patient expects to be  discharged to:: Private residence Living Arrangements: Non-relatives/Friends Available Help at Discharge: Friend(s) Type of Home: House Home Access: Level entry     [place]: One level     Bathroom Shower/Tub: Tub/shower unit;Walk-in shower   Bathroom Toilet: Standard     Home Equipment: None          Prior Functioning/Environment Prior Level of Function : Independent/Modified Independent;Working/employed;Driving               ADLs Comments: Works 3 days/wk at [employer].  Ind with ADL and iADL        OT Problem List: Decreased activity tolerance      OT Treatment/Interventions:      OT Goals(Current goals can be found in the care plan section) Acute Rehab OT Goals Patient Stated Goal: Go home without O2 OT Goal Formulation: With patient Time For Goal Achievement: 09/07/22 Potential to Achieve Goals: Good  OT Frequency:      Co-evaluation              AM-PAC OT "6 Clicks"  Daily Activity     Outcome Measure Help from another person eating meals?: None Help from another person taking care of personal grooming?: None Help from another person toileting, which includes using toliet, bedpan, or urinal?: None Help from another person bathing (including washing, rinsing, drying)?: None Help from another person to put on and taking off regular upper body clothing?: None Help from another person to put on and taking off regular lower body clothing?: None 6 Click Score: 24   End of Session Equipment Utilized During Treatment: Oxygen Nurse Communication: Mobility status  Activity Tolerance: Patient tolerated treatment well Patient left: in chair;with call bell/phone within reach  OT Visit Diagnosis: Other (comment) (O2 dependence)                Time: 5681-2751 OT Time Calculation (min): 15 min Charges:  OT General Charges $OT Visit: 1 Visit OT Evaluation $OT Eval Low Complexity: 1 Low  08/30/2022  RP, OTR/L  Acute Rehabilitation Services  Office:  [phone]   Metta Clines 08/30/2022, 2:57 PM

## 2022-08-30 NOTE — Progress Notes (Signed)
Received a call from bedside RN regarding the patient requesting her home dose of Ativan be restarted.  The patient states she takes Ativan 0.5 mg BID PRN for anxiety.

## 2022-08-30 NOTE — Progress Notes (Signed)
Initial Nutrition Assessment  DOCUMENTATION CODES:   Not applicable  INTERVENTION:   Multivitamin w/ minerals daily Ensure Enlive po BID, each supplement provides 350 kcal and 20 grams of protein. Recommend obtaining new weight.   NUTRITION DIAGNOSIS:   Increased nutrient needs related to chronic illness (COPD) as evidenced by estimated needs.  GOAL:   Patient will meet greater than or equal to 90% of their needs  MONITOR:   PO intake, Supplement acceptance, Labs, Weight trends  REASON FOR ASSESSMENT:   Consult Assessment of nutrition requirement/status  ASSESSMENT:   71 y.o. female presented to the ED with shortness of breath. PMH includes CAD s/p CABG, HLD, COPD, GERD, and HTN. Pt admitted with acute respiratory failure with hypoxia secondary to COPD exacerbation.   Pt unavailable at time of RD visit. Pt currently on a regular diet, no meal intakes have been recorded.  No new weight this admission. RD reached out to RN to obtain. Per EMR, pt weight has appeared to increase slightly over the past year.   RD to order oral nutritional supplements.   Medications reviewed and include: Doxycycline, Solu-Medrol, Protonix Labs reviewed.   NUTRITION - FOCUSED PHYSICAL EXAM:  Deferred to follow-up.  Diet Order:   Diet Order             Diet regular Room service appropriate? Yes; Fluid consistency: Thin  Diet effective now                   EDUCATION NEEDS:   No education needs have been identified at this time  Skin:  Skin Assessment: Reviewed RN Assessment  Last BM:  12/27  Height:   Ht Readings from Last 1 Encounters:  08/23/22 '5\' 2"'$  (1.575 m)    Weight:   Wt Readings from Last 1 Encounters:  08/23/22 57.3 kg    Ideal Body Weight:  50 kg  BMI:  There is no height or weight on file to calculate BMI.  Estimated Nutritional Needs:   Kcal:  1600-1800  Protein:  75-90 grams  Fluid:  >/= 1.6 L    Hermina Barters RD, LDN Clinical  Dietitian See [facility] for contact information.

## 2022-08-30 NOTE — Progress Notes (Addendum)
PROGRESS NOTE    Katherine Foley  ATF:[id number] DOB: 23-Jul-1951 DOA: 08/28/2022 PCP: Marin Olp, MD   Brief Narrative: Katherine Foley is a 71 y.o. female with a history of CAD s/p PCI, CABG, aortic stenosis s/p TAVR, hypertension, COPD, anxiety, depression. Patient presented secondary to shortness of breath and found to have evidence of acute respiratory failure, COPD exacerbation and influenza A infection. Tamiflu, Solu-medrol, bronchodilators and supplemental oxygen provided.   Assessment and Plan: * Acute respiratory failure with hypoxia ([place]) Secondary to COPD exacerbation. Associated dyspnea on exertion. Attempted to wean oxygen, however, patient dropped to SpO2 of 84% with associated dyspnea.  -Wean to room air as able -Ambulatory pulse ox, daily  Influenza A Patient started on Tamiflu. -Continue Tamiflu x5 day total course  COPD with acute exacerbation ([place]) Likely secondary to influenza infection. -Continue Solu-medrol and Duonebs -Add scheduled Duonebs -Continue doxycycline  Coronary artery disease involving native coronary artery with angina pectoris (HCC) -Continue aspirin and Imdur  Essential hypertension -Continue Imdur and amlodipine  HLD (hyperlipidemia) -Continue Crestor and Zetia    DVT prophylaxis: Lovenox Code Status:   Code Status: Full Code Family Communication: None at bedside Disposition Plan: Discharge home in 1-2 days pending ability to wean oxygen to room air if able, and improvement of functional capacity   Consultants:  None  Procedures:  None  Antimicrobials: Doxycycline    Subjective: Patient reports no dyspnea at rest but continued dyspnea with ambulation. She reports some continued productive coughing, although sputum amount is improved. No chest pain.  Objective: BP 109/67   Pulse 71   Temp 98 F (36.7 C) (Oral)   Resp 16   SpO2 95%   Examination:  General exam: Appears calm and  comfortable Respiratory system: Mild end-expiratory wheezing with bilateral bibasilar rales. Respiratory effort normal. Cardiovascular system: S1 & S2 heard, RRR. Gastrointestinal system: Abdomen is nondistended, soft and nontender. No organomegaly or masses felt. Normal bowel sounds heard. Central nervous system: Alert and oriented. No focal neurological deficits. Musculoskeletal: No edema. No calf tenderness Skin: No cyanosis. No rashes Psychiatry: Judgement and insight appear normal. Mood & affect appropriate.    Data Reviewed: I have personally reviewed following labs and imaging studies  CBC Lab Results  Component Value Date   WBC 9.9 08/28/2022   RBC 3.94 08/28/2022   HGB 12.3 08/28/2022   HCT 37.1 08/28/2022   MCV 94.2 08/28/2022   MCH 31.2 08/28/2022   PLT 240 08/28/2022   MCHC 33.2 08/28/2022   RDW 13.6 08/28/2022   LYMPHSABS 0.7 08/28/2022   MONOABS 0.4 08/28/2022   EOSABS 0.1 08/28/2022   BASOSABS 0.0 27/02/2375     Last metabolic panel Lab Results  Component Value Date   NA 137 08/29/2022   K 3.8 08/29/2022   CL 103 08/29/2022   CO2 28 08/29/2022   BUN 9 08/29/2022   CREATININE 0.58 08/29/2022   GLUCOSE 143 (H) 08/29/2022   GFRNONAA >60 08/29/2022   GFRAA 109 07/01/2019   CALCIUM 8.7 (L) 08/29/2022   PROT 6.3 03/05/2022   ALBUMIN 4.2 03/05/2022   LABGLOB 2.3 07/01/2019   AGRATIO 1.8 07/01/2019   BILITOT 0.4 03/05/2022   ALKPHOS 48 03/05/2022   AST 35 03/05/2022   ALT 32 03/05/2022   ANIONGAP 6 08/29/2022    GFR: Estimated Creatinine Clearance: 49.5 mL/min (by C-G formula based on SCr of 0.58 mg/dL).  Recent Results (from the past 240 hour(s))  Resp panel by RT-PCR (RSV, Flu A&B,  Covid) Anterior Nasal Swab     Status: Abnormal   Collection Time: 08/28/22  2:30 PM   Specimen: Anterior Nasal Swab  Result Value Ref Range Status   SARS Coronavirus 2 by RT PCR NEGATIVE NEGATIVE Final    Comment: (NOTE) SARS-CoV-2 target nucleic acids are NOT  DETECTED.  The SARS-CoV-2 RNA is generally detectable in upper respiratory specimens during the acute phase of infection. The lowest concentration of SARS-CoV-2 viral copies this assay can detect is 138 copies/mL. A negative result does not preclude SARS-Cov-2 infection and should not be used as the sole basis for treatment or other patient management decisions. A negative result may occur with  improper specimen collection/handling, submission of specimen other than nasopharyngeal swab, presence of viral mutation(s) within the areas targeted by this assay, and inadequate number of viral copies(<138 copies/mL). A negative result must be combined with clinical observations, patient history, and epidemiological information. The expected result is Negative.  Fact Sheet for Patients:  EntrepreneurPulse.com.au  Fact Sheet for Healthcare Providers:  IncredibleEmployment.be  This test is no t yet approved or cleared by the [place] FDA and  has been authorized for detection and/or diagnosis of SARS-CoV-2 by FDA under an Emergency Use Authorization (EUA). This EUA will remain  in effect (meaning this test can be used) for the duration of the COVID-19 declaration under Section 564(b)(1) of the Act, 21 U.S.C.section 360bbb-3(b)(1), unless the authorization is terminated  or revoked sooner.       Influenza A by PCR POSITIVE (A) NEGATIVE Final   Influenza B by PCR NEGATIVE NEGATIVE Final    Comment: (NOTE) The Xpert Xpress SARS-CoV-2/FLU/RSV plus assay is intended as an aid in the diagnosis of influenza from Nasopharyngeal swab specimens and should not be used as a sole basis for treatment. Nasal washings and aspirates are unacceptable for Xpert Xpress SARS-CoV-2/FLU/RSV testing.  Fact Sheet for Patients: EntrepreneurPulse.com.au  Fact Sheet for Healthcare Providers: IncredibleEmployment.be  This test is not  yet approved or cleared by the [place] FDA and has been authorized for detection and/or diagnosis of SARS-CoV-2 by FDA under an Emergency Use Authorization (EUA). This EUA will remain in effect (meaning this test can be used) for the duration of the COVID-19 declaration under Section 564(b)(1) of the Act, 21 U.S.C. section 360bbb-3(b)(1), unless the authorization is terminated or revoked.     Resp Syncytial Virus by PCR NEGATIVE NEGATIVE Final    Comment: (NOTE) Fact Sheet for Patients: EntrepreneurPulse.com.au  Fact Sheet for Healthcare Providers: IncredibleEmployment.be  This test is not yet approved or cleared by the [place] FDA and has been authorized for detection and/or diagnosis of SARS-CoV-2 by FDA under an Emergency Use Authorization (EUA). This EUA will remain in effect (meaning this test can be used) for the duration of the COVID-19 declaration under Section 564(b)(1) of the Act, 21 U.S.C. section 360bbb-3(b)(1), unless the authorization is terminated or revoked.  Performed at [facility], [place] [address] [place], [place] [address]       Radiology Studies: DG Chest 2 View  Result Date: 08/28/2022 CLINICAL DATA:  Cough and shortness of breath for several weeks. EXAM: CHEST - 2 VIEW COMPARISON:  Chest x-ray dated August 20, 2022. FINDINGS: Normal heart size status post CABG and AVR. Normal mediastinal contours. Normal pulmonary vascularity. No focal consolidation, pleural effusion, or pneumothorax. Chronic scarring at the left costophrenic angle. No acute osseous abnormality. IMPRESSION: No active cardiopulmonary disease. Electronically Signed   By: Orville Govern.D.  On: 08/28/2022 15:29      LOS: 2 days    Cordelia Poche, MD [facility] 08/30/2022, 9:01 AM   If 7PM-7AM, please contact night-coverage [email or web address]

## 2022-08-30 NOTE — Progress Notes (Signed)
SATURATION QUALIFICATIONS: (This note is used to comply with regulatory documentation for home oxygen)  Patient Saturations on Room Air at Rest = 90%  Patient Saturations on Room Air while Ambulating = 86%  Patient Saturations on 2 Liters of oxygen while Ambulating = 92%  Please briefly explain why patient needs home oxygen:Unable to maintain adequate level oxygenation with activity without supplemental [place] Office [phone]

## 2022-08-30 NOTE — Assessment & Plan Note (Addendum)
Likely secondary to influenza infection. Patient managed on Solu-medrol IV, bronchodilators and doxycycline. Cefdinir added prior to discharge. Patient discharged with antibiotics and prednisone with short taper.

## 2022-08-30 NOTE — Assessment & Plan Note (Addendum)
Patient started on Tamiflu. Supportive care. Discharged to complete a 5 day course of Tamiflu.

## 2022-08-30 NOTE — Progress Notes (Signed)
RN attempted to walk the pt for Ambulation Sat, pt walked to bathroom on room air and she started having SOB, SPO2 was 84%. RN place her on 2L O2 and she is currently on 91-92%. Will attempt to walk with her again later. MD made-aware.

## 2022-08-30 NOTE — Evaluation (Signed)
Physical Therapy Evaluation Patient Details Name: Katherine Foley MRN: [id number] DOB: 02-16-1951 Today's Date: 08/30/2022  History of Present Illness  Pt is 71 year old presented to [facility] on  08/28/22 with weakness and SOB. Pt found to have acute respiratory failure with hypoxia secondary to COPD exacerbation in the setting of influenza A. PMH - CAD, PCI, CABG, aortic stenosis, TAVR, HTN, COPD, anxiety/depression.  Clinical Impression  Pt doing well with mobility and no further PT needed.  Did require supplemental O2 at 2L to maintain SpO2 >88% when ambulating.         Recommendations for follow up therapy are one component of a multi-disciplinary discharge planning process, led by the attending physician.  Recommendations may be updated based on patient status, additional functional criteria and insurance authorization.  Follow Up Recommendations No PT follow up      Assistance Recommended at Discharge None  Patient can return home with the following       Equipment Recommendations None recommended by PT  Recommendations for Other Services       Functional Status Assessment Patient has not had a recent decline in their functional status     Precautions / Restrictions Precautions Precautions: Other (comment) Precaution Comments: O2 sats Restrictions Weight Bearing Restrictions: No      Mobility  Bed Mobility               General bed mobility comments: Pt up in chair    Transfers Overall transfer level: Independent                      Ambulation/Gait Ambulation/Gait assistance: Modified independent (Device/Increase time) Gait Distance (Feet): 250 Feet Assistive device: None Gait Pattern/deviations: WFL(Within Functional Limits) Gait velocity: decr Gait velocity interpretation: >2.62 ft/sec, indicative of community ambulatory   General Gait Details: Steady gait  Stairs            Wheelchair Mobility    Modified Rankin (Stroke  Patients Only)       Balance Overall balance assessment: No apparent balance deficits (not formally assessed)                                           Pertinent Vitals/Pain Pain Assessment Pain Assessment: No/denies pain    Home Living Family/patient expects to be discharged to:: Private residence Living Arrangements: Non-relatives/Friends Available Help at Discharge: Friend(s) Type of Home: House Home Access: Level entry       Home Layout: One level Home Equipment: None      Prior Function Prior Level of Function : Independent/Modified Independent;Working/employed;Driving             Mobility Comments: No assistive device ADLs Comments: Works 3 days/wk at [employer].  Ind with ADL and iADL     Hand Dominance   Dominant Hand: Right    Extremity/Trunk Assessment   Upper Extremity Assessment Upper Extremity Assessment: Defer to OT evaluation    Lower Extremity Assessment Lower Extremity Assessment: Overall WFL for tasks assessed    Cervical / Trunk Assessment Cervical / Trunk Assessment: Normal  Communication   Communication: No difficulties  Cognition Arousal/Alertness: Awake/alert Behavior During Therapy: WFL for tasks assessed/performed Overall Cognitive Status: Within Functional Limits for tasks assessed  General Comments General comments (skin integrity, edema, etc.): SpO2 86% amb on RA. 92% amb on 2L O2.    Exercises     Assessment/Plan    PT Assessment Patient does not need any further PT services  PT Problem List         PT Treatment Interventions      PT Goals (Current goals can be found in the Care Plan section)  Acute Rehab PT Goals PT Goal Formulation: All assessment and education complete, DC therapy    Frequency       Co-evaluation               AM-PAC PT "6 Clicks" Mobility  Outcome Measure Help needed turning from your back to your  side while in a flat bed without using bedrails?: None Help needed moving from lying on your back to sitting on the side of a flat bed without using bedrails?: None Help needed moving to and from a bed to a chair (including a wheelchair)?: None Help needed standing up from a chair using your arms (e.g., wheelchair or bedside chair)?: None Help needed to walk in hospital room?: None Help needed climbing 3-5 steps with a railing? : None 6 Click Score: 24    End of Session Equipment Utilized During Treatment: Oxygen Activity Tolerance: Patient tolerated treatment well Patient left: in chair;with call bell/phone within reach   PT Visit Diagnosis: Other (comment) (decr activity tolerance)    Time: 1856-3149 PT Time Calculation (min) (ACUTE ONLY): 17 min   Charges:   PT Evaluation $PT Eval Low Complexity: [place] [facility] [place] 08/30/2022, 3:45 PM

## 2022-08-30 NOTE — Progress Notes (Addendum)
SATURATION QUALIFICATIONS: Patient Saturations on Room Air at Rest = 90%  Patient Saturations on [employer] while Ambulating = 84%  Patient Saturations on 4 Liters of oxygen while Ambulating = 91%  Please briefly explain why patient needs home oxygen: Pt was able to ambulate independently on Room Air around the nursing station but she desat to 84%, O2 was started and was on 4L of O2 to reach 91% SPO2. Pt was having shortness of breath while walking and proper breathing was instructed, but pt believe wearing the mask made it more difficult the breathe when ambulating, and having dry cough too. Left the pt in the room with OT for therapy with 4L Oxygen.

## 2022-08-30 NOTE — Hospital Course (Addendum)
Katherine Foley is a 71 y.o. female with a history of CAD s/p PCI, CABG, aortic stenosis s/p TAVR, hypertension, COPD, anxiety, depression. Patient presented secondary to shortness of breath and found to have evidence of acute respiratory failure, COPD exacerbation and influenza A infection. Tamiflu, Solu-medrol, bronchodilators and supplemental oxygen provided with gradual improvement. Patient underwent ambulatory oxygen testing and did not qualify for oxygen. Patient felt that she was functionally able to manage her ADLs at home on discharge. Patient discharged on room air.

## 2022-08-31 ENCOUNTER — Telehealth: Payer: Self-pay | Admitting: Pulmonary Disease

## 2022-08-31 DIAGNOSIS — J441 Chronic obstructive pulmonary disease with (acute) exacerbation: Secondary | ICD-10-CM | POA: Diagnosis not present

## 2022-08-31 DIAGNOSIS — J9601 Acute respiratory failure with hypoxia: Secondary | ICD-10-CM | POA: Diagnosis not present

## 2022-08-31 DIAGNOSIS — I10 Essential (primary) hypertension: Secondary | ICD-10-CM | POA: Diagnosis not present

## 2022-08-31 DIAGNOSIS — I25119 Atherosclerotic heart disease of native coronary artery with unspecified angina pectoris: Secondary | ICD-10-CM | POA: Diagnosis not present

## 2022-08-31 MED ORDER — IPRATROPIUM-ALBUTEROL 0.5-2.5 (3) MG/3ML IN SOLN: 3.0000 mL | Freq: Four times a day (QID) | RESPIRATORY_TRACT | 4 refills | Status: AC | PRN

## 2022-08-31 NOTE — Progress Notes (Signed)
Nurse requested Mobility Specialist to perform oxygen saturation test with pt which includes removing pt from oxygen both at rest and while ambulating.  Below are the results from that testing.    Patient Saturations on Room Air at Rest = spO2 94%  Patient Saturations on Room Air while Ambulating = sp02 88% .  Rested and performed pursed lip breathing for 1 minute with sp02 at 91%.  At end of testing pt left in room on Room Air.  Reported results to nurse.

## 2022-08-31 NOTE — Progress Notes (Signed)
PROGRESS NOTE    Katherine Foley  LXB:[id number] DOB: 1951/07/22 DOA: 08/28/2022 PCP: Marin Olp, MD   Brief Narrative: Katherine Foley is a 71 y.o. female with a history of CAD s/p PCI, CABG, aortic stenosis s/p TAVR, hypertension, COPD, anxiety, depression. Patient presented secondary to shortness of breath and found to have evidence of acute respiratory failure, COPD exacerbation and influenza A infection. Tamiflu, Solu-medrol, bronchodilators and supplemental oxygen provided.   Assessment and Plan: * Acute respiratory failure with hypoxia ([place]) Secondary to COPD exacerbation. Associated dyspnea on exertion. Attempted to wean oxygen, however, patient dropped to SpO2 of 84% with associated dyspnea.  -Wean to room air as able -Ambulatory pulse ox, daily  Influenza A Patient started on Tamiflu. -Continue Tamiflu x5 day total course  COPD with acute exacerbation ([place]) Likely secondary to influenza infection. -Continue Solu-medrol and Duonebs -Add scheduled Duonebs -Continue doxycycline  Coronary artery disease involving native coronary artery with angina pectoris (HCC) -Continue aspirin and Imdur  Essential hypertension -Continue Imdur and amlodipine  HLD (hyperlipidemia) -Continue Crestor and Zetia    DVT prophylaxis: Lovenox Code Status:   Code Status: Full Code Family Communication: None at bedside Disposition Plan: Discharge home in 1-2 days pending ability to wean oxygen to room air if able, and improvement of functional capacity   Consultants:  None  Procedures:  None  Antimicrobials: Doxycycline    Subjective: Some improvement in dyspnea. Patient with continued significant dyspnea on exertion with associated hypoxia; functional capacity not at baseline  Objective: BP 100/68 (BP Location: Left Arm)   Pulse 81   Temp 98 F (36.7 C) (Oral)   Resp 16   Wt 55.8 kg   SpO2 90%   BMI 22.52 kg/m   Examination:  General exam:  Appears calm and comfortable Respiratory system: Mild diffuse wheezing with bilateral lower lobe rales. Respiratory effort normal. Cardiovascular system: S1 & S2 heard, RRR. No murmurs, rubs, gallops or clicks. Gastrointestinal system: Abdomen is nondistended, soft and nontender. Normal bowel sounds heard. Central nervous system: Alert and oriented. No focal neurological deficits. Musculoskeletal: No edema. No calf tenderness Skin: No cyanosis. No rashes Psychiatry: Judgement and insight appear normal. Mood & affect appropriate.    Data Reviewed: I have personally reviewed following labs and imaging studies  CBC Lab Results  Component Value Date   WBC 9.9 08/28/2022   RBC 3.94 08/28/2022   HGB 12.3 08/28/2022   HCT 37.1 08/28/2022   MCV 94.2 08/28/2022   MCH 31.2 08/28/2022   PLT 240 08/28/2022   MCHC 33.2 08/28/2022   RDW 13.6 08/28/2022   LYMPHSABS 0.7 08/28/2022   MONOABS 0.4 08/28/2022   EOSABS 0.1 08/28/2022   BASOSABS 0.0 41/63/8453     Last metabolic panel Lab Results  Component Value Date   NA 137 08/29/2022   K 3.8 08/29/2022   CL 103 08/29/2022   CO2 28 08/29/2022   BUN 9 08/29/2022   CREATININE 0.58 08/29/2022   GLUCOSE 143 (H) 08/29/2022   GFRNONAA >60 08/29/2022   GFRAA 109 07/01/2019   CALCIUM 8.7 (L) 08/29/2022   PROT 6.3 03/05/2022   ALBUMIN 4.2 03/05/2022   LABGLOB 2.3 07/01/2019   AGRATIO 1.8 07/01/2019   BILITOT 0.4 03/05/2022   ALKPHOS 48 03/05/2022   AST 35 03/05/2022   ALT 32 03/05/2022   ANIONGAP 6 08/29/2022    GFR: Estimated Creatinine Clearance: 49.5 mL/min (by C-G formula based on SCr of 0.58 mg/dL).  Recent Results (from the past  240 hour(s))  Resp panel by RT-PCR (RSV, Flu A&B, Covid) Anterior Nasal Swab     Status: Abnormal   Collection Time: 08/28/22  2:30 PM   Specimen: Anterior Nasal Swab  Result Value Ref Range Status   SARS Coronavirus 2 by RT PCR NEGATIVE NEGATIVE Final    Comment: (NOTE) SARS-CoV-2 target nucleic  acids are NOT DETECTED.  The SARS-CoV-2 RNA is generally detectable in upper respiratory specimens during the acute phase of infection. The lowest concentration of SARS-CoV-2 viral copies this assay can detect is 138 copies/mL. A negative result does not preclude SARS-Cov-2 infection and should not be used as the sole basis for treatment or other patient management decisions. A negative result may occur with  improper specimen collection/handling, submission of specimen other than nasopharyngeal swab, presence of viral mutation(s) within the areas targeted by this assay, and inadequate number of viral copies(<138 copies/mL). A negative result must be combined with clinical observations, patient history, and epidemiological information. The expected result is Negative.  Fact Sheet for Patients:  EntrepreneurPulse.com.au  Fact Sheet for Healthcare Providers:  IncredibleEmployment.be  This test is no t yet approved or cleared by the [place] FDA and  has been authorized for detection and/or diagnosis of SARS-CoV-2 by FDA under an Emergency Use Authorization (EUA). This EUA will remain  in effect (meaning this test can be used) for the duration of the COVID-19 declaration under Section 564(b)(1) of the Act, 21 U.S.C.section 360bbb-3(b)(1), unless the authorization is terminated  or revoked sooner.       Influenza A by PCR POSITIVE (A) NEGATIVE Final   Influenza B by PCR NEGATIVE NEGATIVE Final    Comment: (NOTE) The Xpert Xpress SARS-CoV-2/FLU/RSV plus assay is intended as an aid in the diagnosis of influenza from Nasopharyngeal swab specimens and should not be used as a sole basis for treatment. Nasal washings and aspirates are unacceptable for Xpert Xpress SARS-CoV-2/FLU/RSV testing.  Fact Sheet for Patients: EntrepreneurPulse.com.au  Fact Sheet for Healthcare  Providers: IncredibleEmployment.be  This test is not yet approved or cleared by the [place] FDA and has been authorized for detection and/or diagnosis of SARS-CoV-2 by FDA under an Emergency Use Authorization (EUA). This EUA will remain in effect (meaning this test can be used) for the duration of the COVID-19 declaration under Section 564(b)(1) of the Act, 21 U.S.C. section 360bbb-3(b)(1), unless the authorization is terminated or revoked.     Resp Syncytial Virus by PCR NEGATIVE NEGATIVE Final    Comment: (NOTE) Fact Sheet for Patients: EntrepreneurPulse.com.au  Fact Sheet for Healthcare Providers: IncredibleEmployment.be  This test is not yet approved or cleared by the [place] FDA and has been authorized for detection and/or diagnosis of SARS-CoV-2 by FDA under an Emergency Use Authorization (EUA). This EUA will remain in effect (meaning this test can be used) for the duration of the COVID-19 declaration under Section 564(b)(1) of the Act, 21 U.S.C. section 360bbb-3(b)(1), unless the authorization is terminated or revoked.  Performed at [facility], [place] [address] [place], [place] [address]       Radiology Studies: No results found.    LOS: 3 days    Cordelia Poche, MD [facility] 08/31/2022, 5:22 PM   If 7PM-7AM, please contact night-coverage [email or web address]

## 2022-08-31 NOTE — Care Management Important Message (Signed)
Important Message  Patient Details  Name: Teal Raben MRN: [id number] Date of Birth: November 03, 1950   Medicare Important Message Given:  Yes  Due to contact precaution order could not go into the patient room patient advised me to send copy to her home address.   Kairee Kozma 08/31/2022, 3:40 PM

## 2022-08-31 NOTE — Telephone Encounter (Signed)
Called and spoke to patient and let her know that I looked at last AVS and that I will get her Duoneb treatments sent into [facility] as she requested. She stated she understand. Nothing further needed

## 2022-08-31 NOTE — Progress Notes (Signed)
Mobility Specialist Progress Note:   08/31/22 1508  Mobility  Activity Ambulated independently in hallway  Level of Assistance Independent  Assistive Device None  Distance Ambulated (ft) 500 ft  Activity Response Tolerated well  Mobility Referral Yes  $Mobility charge 1 Mobility   Pre-mobility: 94% SpO2 on RA During mobility: 88% SpO2 on RA Post-mobility: 94% SpO2 on RA  Pt received in chair and agreeable. Performed O2 sat test per request of RN. C/o coughing fits but no complaints of SOB. Pt left in chair with all needs met, call bell in reach, and on RA.   Andrey Campanile Mobility Specialist Please contact via SecureChat or  Rehab office at [phone]

## 2022-09-01 ENCOUNTER — Inpatient Hospital Stay (HOSPITAL_COMMUNITY): Payer: Medicare HMO

## 2022-09-01 DIAGNOSIS — J9601 Acute respiratory failure with hypoxia: Secondary | ICD-10-CM | POA: Diagnosis not present

## 2022-09-01 DIAGNOSIS — J9690 Respiratory failure, unspecified, unspecified whether with hypoxia or hypercapnia: Secondary | ICD-10-CM | POA: Diagnosis not present

## 2022-09-01 DIAGNOSIS — R0902 Hypoxemia: Secondary | ICD-10-CM | POA: Diagnosis not present

## 2022-09-01 MED ORDER — GUAIFENESIN ER 600 MG PO TB12: 1200.0000 mg | ORAL_TABLET | Freq: Two times a day (BID) | ORAL | Status: DC

## 2022-09-01 MED ORDER — BENZONATATE 100 MG PO CAPS
200.0000 mg | ORAL_CAPSULE | Freq: Three times a day (TID) | ORAL | Status: DC
  Administered 2022-09-01: 200 mg via ORAL
  Filled 2022-09-01: qty 2

## 2022-09-01 MED ORDER — IPRATROPIUM-ALBUTEROL 0.5-2.5 (3) MG/3ML IN SOLN
3.0000 mL | Freq: Three times a day (TID) | RESPIRATORY_TRACT | Status: DC
  Administered 2022-09-01: 3 mL via RESPIRATORY_TRACT
  Filled 2022-09-01 (×2): qty 3

## 2022-09-01 MED ORDER — CEFDINIR 300 MG PO CAPS: 300.0000 mg | ORAL_CAPSULE | Freq: Two times a day (BID) | ORAL | 0 refills | Status: AC

## 2022-09-01 MED ORDER — GUAIFENESIN-DM 100-10 MG/5ML PO SYRP: 10.0000 mL | ORAL_SOLUTION | ORAL | Status: DC | PRN

## 2022-09-01 MED ORDER — PREDNISONE 10 MG PO TABS: ORAL_TABLET | ORAL | 0 refills | Status: AC

## 2022-09-01 MED ORDER — OSELTAMIVIR PHOSPHATE 30 MG PO CAPS: 30.0000 mg | ORAL_CAPSULE | Freq: Two times a day (BID) | ORAL | 0 refills | Status: AC

## 2022-09-01 MED ORDER — ENSURE ENLIVE PO LIQD: 237.0000 mL | Freq: Two times a day (BID) | ORAL | Status: DC

## 2022-09-01 MED ORDER — CEFDINIR 300 MG PO CAPS
300.0000 mg | ORAL_CAPSULE | Freq: Two times a day (BID) | ORAL | Status: DC
  Administered 2022-09-01: 300 mg via ORAL
  Filled 2022-09-01: qty 1

## 2022-09-01 MED ORDER — BENZONATATE 200 MG PO CAPS: 200.0000 mg | ORAL_CAPSULE | Freq: Three times a day (TID) | ORAL | 0 refills | Status: DC

## 2022-09-01 MED ORDER — DOXYCYCLINE HYCLATE 100 MG PO TABS: 100.0000 mg | ORAL_TABLET | Freq: Two times a day (BID) | ORAL | 0 refills | Status: AC

## 2022-09-01 NOTE — Discharge Summary (Signed)
Physician Discharge Summary   Patient: Katherine Foley MRN: [id number] DOB: [birth year]  Admit date:     08/28/2022  Discharge date: 09/03/22  Discharge Physician: Jacquelin Hawking, MD   PCP: Shelva Majestic, MD   Recommendations at discharge:  Hospital follow-up with PCP  Discharge Diagnoses: Active Problems:   COPD with acute exacerbation (HCC)   Influenza A   Essential hypertension   Coronary artery disease involving native coronary artery with angina pectoris (HCC)   HLD (hyperlipidemia)  Principal Problem (Resolved):   Acute respiratory failure with hypoxia [facility])  Hospital Course: Katherine Foley is a 71 y.o. female with a history of CAD s/p PCI, CABG, aortic stenosis s/p TAVR, hypertension, COPD, anxiety, depression. Patient presented secondary to shortness of breath and found to have evidence of acute respiratory failure, COPD exacerbation and influenza A infection. Tamiflu, Solu-medrol, bronchodilators and supplemental oxygen provided with gradual improvement. Patient underwent ambulatory oxygen testing and did not qualify for oxygen. Patient felt that she was functionally able to manage her ADLs at home on discharge. Patient discharged on room air.  Assessment and Plan: * Acute respiratory failure with hypoxia (HCC)-resolved as of 09/03/2022 Secondary to COPD exacerbation. Associated dyspnea on exertion. Attempted to wean oxygen, however, patient dropped to SpO2 of 84% with associated dyspnea. As hospitalization progressed, patient was successfully weaned to room air and did not qualify for oxygen during ambulatory oxygen testing.  COPD with acute exacerbation (HCC) Likely secondary to influenza infection. Patient managed on Solu-medrol IV, bronchodilators and doxycycline. Cefdinir added prior to discharge. Patient discharged with antibiotics and prednisone with short taper.  Influenza A Patient started on Tamiflu. Supportive care. Discharged to complete a 5 day course  of Tamiflu.  Coronary artery disease involving native coronary artery with angina pectoris (HCC) Continue aspirin and Imdur.  Essential hypertension Continue Imdur and amlodipine  HLD (hyperlipidemia) Continue Crestor and Zetia    Consultants: None Procedures performed: None  Disposition: Home Diet recommendation: Cardiac diet   DISCHARGE MEDICATION: Allergies as of 09/01/2022       Reactions   Codeine Sulfate Itching, Rash   Hydrocodone-acetaminophen Itching, Rash        Medication List     TAKE these medications    acetaminophen 650 MG CR tablet Commonly known as: TYLENOL Take 1,300 mg by mouth every 8 (eight) hours as needed for pain.   albuterol 108 (90 Base) MCG/ACT inhaler Commonly known as: VENTOLIN HFA Inhale 2 puffs into the lungs every 6 (six) hours as needed for wheezing or shortness of breath.   amLODipine 5 MG tablet Commonly known as: NORVASC TAKE ONE TABLET BY MOUTH ONCE DAILY   aspirin EC 81 MG tablet Take 1 tablet (81 mg total) by mouth daily.   benzonatate 200 MG capsule Commonly known as: TESSALON Take 1 capsule (200 mg total) by mouth 3 (three) times daily. What changed:  when to take this reasons to take this   Breztri Aerosphere 160-9-4.8 MCG/ACT Aero Generic drug: Budeson-Glycopyrrol-Formoterol Inhale 2 puffs into the lungs in the morning and at bedtime.   buPROPion 300 MG 24 hr tablet Commonly known as: WELLBUTRIN XL TAKE ONE TABLET BY MOUTH ONCE DAILY   CALCIUM 500/D PO Take 2 tablets by mouth daily. Mag and zinc added   cefdinir 300 MG capsule Commonly known as: OMNICEF Take 1 capsule (300 mg total) by mouth every 12 (twelve) hours for 3 days.   cetirizine 10 MG tablet Commonly known as: ZYRTEC Take 10 mg by  mouth at bedtime.   desvenlafaxine 100 MG 24 hr tablet Commonly known as: PRISTIQ TAKE ONE TABLET BY MOUTH EVERY MORNING What changed: when to take this   dexlansoprazole 60 MG capsule Commonly known as:  DEXILANT TAKE ONE CAPSULE BY MOUTH ONCE DAILY   doxycycline 100 MG tablet Commonly known as: VIBRA-TABS Take 1 tablet (100 mg total) by mouth 2 (two) times daily for 3 days. Take 2 hours before or 4 hours after iron. What changed: additional instructions   ezetimibe 10 MG tablet Commonly known as: ZETIA TAKE ONE TABLET BY MOUTH ONCE DAILY   feeding supplement Liqd Take 237 mLs by mouth 2 (two) times daily between meals.   fluticasone 0.05 % cream Commonly known as: CUTIVATE Apply 1 Application topically 2 (two) times daily as needed (eczema).   fluticasone 50 MCG/ACT nasal spray Commonly known as: FLONASE Place 2 sprays into both nostrils as needed for allergies or rhinitis.   gabapentin 300 MG capsule Commonly known as: NEURONTIN TAKE ONE CAPSULE BY MOUTH EVERYDAY AT BEDTIME What changed:  how much to take how to take this when to take this additional instructions   ipratropium-albuterol 0.5-2.5 (3) MG/3ML Soln Commonly known as: DUONEB Take 3 mLs by nebulization every 6 (six) hours as needed.   isosorbide mononitrate 30 MG 24 hr tablet Commonly known as: IMDUR Take 0.5 tablets (15 mg total) by mouth daily.   LORazepam 0.5 MG tablet Commonly known as: ATIVAN TAKE ONE TABLET BY MOUTH twice daily AS NEEDED FOR ANXIETY. Do not drive 8 hours after using.   MULTIVITAMIN ADULT PO Take 1 tablet by mouth daily.   nitroGLYCERIN 0.4 MG SL tablet Commonly known as: NITROSTAT Place 1 tablet (0.4 mg total) under the tongue every 5 (five) minutes as needed for chest pain (if pain does not resolve with 2 doses, call 911).   predniSONE 10 MG tablet Commonly known as: DELTASONE Take 4 tablets (40 mg total) by mouth daily with breakfast for 2 days, THEN 2 tablets (20 mg total) daily with breakfast for 3 days, THEN 1 tablet (10 mg total) daily with breakfast for 3 days. Start taking on: September 02, 2022 What changed:  medication strength See the new instructions.    rosuvastatin 40 MG tablet Commonly known as: CRESTOR Take 1 tablet (40 mg total) by mouth daily.   Valtrex 1000 MG tablet Generic drug: valACYclovir Take 1,000 mg by mouth daily as needed (for fever blisters).       ASK your doctor about these medications    oseltamivir 30 MG capsule Commonly known as: TAMIFLU Take 1 capsule (30 mg total) by mouth 2 (two) times daily for 2 doses. Ask about: Should I take this medication?        Discharge Exam: BP 112/71 (BP Location: Right Arm)   Pulse 78   Temp 98.1 F (36.7 C) (Oral)   Resp 20   Wt 55.8 kg   SpO2 94%   BMI 22.52 kg/m   General exam: Appears calm and comfortable Respiratory system: Mild wheezing. Respiratory effort normal. Cardiovascular system: S1 & S2 heard. Gastrointestinal system: Abdomen is nondistended, soft and nontender. Normal bowel sounds heard. Central nervous system: Alert and oriented. No focal neurological deficits. Psychiatry: Judgement and insight appear normal. Mood & affect appropriate.   Condition at discharge: stable  The results of significant diagnostics from this hospitalization (including imaging, microbiology, ancillary and laboratory) are listed below for reference.   Imaging Studies: DG CHEST PORT 1 VIEW  Result Date: 09/01/2022 CLINICAL DATA:  Acute respiratory failure with hypoxia. EXAM: PORTABLE CHEST 1 VIEW COMPARISON:  08/28/2022 FINDINGS: Status post median sternotomy. Heart size is normal. There is minimal LEFT basilar pleural thickening or small pleural effusion, showing long-term stability. Lungs are clear otherwise. No edema. IMPRESSION: Minimal LEFT basilar pleural thickening or small pleural effusion. Electronically Signed   By: Norva Pavlov M.D.   On: 09/01/2022 07:17   DG Chest 2 View  Result Date: 08/28/2022 CLINICAL DATA:  Cough and shortness of breath for several weeks. EXAM: CHEST - 2 VIEW COMPARISON:  Chest x-ray dated August 20, 2022. FINDINGS: Normal heart  size status post CABG and AVR. Normal mediastinal contours. Normal pulmonary vascularity. No focal consolidation, pleural effusion, or pneumothorax. Chronic scarring at the left costophrenic angle. No acute osseous abnormality. IMPRESSION: No active cardiopulmonary disease. Electronically Signed   By: Obie Dredge M.D.   On: 08/28/2022 15:29   DG Chest 2 View  Result Date: 08/20/2022 CLINICAL DATA:  Abnormal breath sounds EXAM: CHEST - 2 VIEW COMPARISON:  03/09/2020. FINDINGS: Cardiac silhouette unremarkable. Stable pleural thickening left base. Lungs are otherwise clear. No pneumothorax. Calcified aorta. Normal pulmonary vasculature. Median sternotomy wires. Prosthetic aortic valve IMPRESSION: Minimal left basilar pleural effusion or pleural thickening which is unchanged. Otherwise no acute cardiopulmonary process identified. Electronically Signed   By: Layla Maw M.D.   On: 08/20/2022 10:37    Microbiology: Results for orders placed or performed during the hospital encounter of 08/28/22  Resp panel by RT-PCR (RSV, Flu A&B, Covid) Anterior Nasal Swab     Status: Abnormal   Collection Time: 08/28/22  2:30 PM   Specimen: Anterior Nasal Swab  Result Value Ref Range Status   SARS Coronavirus 2 by RT PCR NEGATIVE NEGATIVE Final    Comment: (NOTE) SARS-CoV-2 target nucleic acids are NOT DETECTED.  The SARS-CoV-2 RNA is generally detectable in upper respiratory specimens during the acute phase of infection. The lowest concentration of SARS-CoV-2 viral copies this assay can detect is 138 copies/mL. A negative result does not preclude SARS-Cov-2 infection and should not be used as the sole basis for treatment or other patient management decisions. A negative result may occur with  improper specimen collection/handling, submission of specimen other than nasopharyngeal swab, presence of viral mutation(s) within the areas targeted by this assay, and inadequate number of viral copies(<138  copies/mL). A negative result must be combined with clinical observations, patient history, and epidemiological information. The expected result is Negative.  Fact Sheet for Patients:  BloggerCourse.com  Fact Sheet for Healthcare Providers:  SeriousBroker.it  This test is no t yet approved or cleared by the [place] FDA and  has been authorized for detection and/or diagnosis of SARS-CoV-2 by FDA under an Emergency Use Authorization (EUA). This EUA will remain  in effect (meaning this test can be used) for the duration of the COVID-19 declaration under Section 564(b)(1) of the Act, 21 U.S.C.section 360bbb-3(b)(1), unless the authorization is terminated  or revoked sooner.       Influenza A by PCR POSITIVE (A) NEGATIVE Final   Influenza B by PCR NEGATIVE NEGATIVE Final    Comment: (NOTE) The Xpert Xpress SARS-CoV-2/FLU/RSV plus assay is intended as an aid in the diagnosis of influenza from Nasopharyngeal swab specimens and should not be used as a sole basis for treatment. Nasal washings and aspirates are unacceptable for Xpert Xpress SARS-CoV-2/FLU/RSV testing.  Fact Sheet for Patients: BloggerCourse.com  Fact Sheet for Healthcare Providers: SeriousBroker.it  This  test is not yet approved or cleared by the [place] and has been authorized for detection and/or diagnosis of SARS-CoV-2 by FDA under an Emergency Use Authorization (EUA). This EUA will remain in effect (meaning this test can be used) for the duration of the COVID-19 declaration under Section 564(b)(1) of the Act, 21 U.S.C. section 360bbb-3(b)(1), unless the authorization is terminated or revoked.     Resp Syncytial Virus by PCR NEGATIVE NEGATIVE Final    Comment: (NOTE) Fact Sheet for Patients: BloggerCourse.com  Fact Sheet for Healthcare  Providers: SeriousBroker.it  This test is not yet approved or cleared by the [place] FDA and has been authorized for detection and/or diagnosis of SARS-CoV-2 by FDA under an Emergency Use Authorization (EUA). This EUA will remain in effect (meaning this test can be used) for the duration of the COVID-19 declaration under Section 564(b)(1) of the Act, 21 U.S.C. section 360bbb-3(b)(1), unless the authorization is terminated or revoked.  Performed at [facility], [address] [place], [place] [address]     Labs: CBC: Recent Labs  Lab 08/28/22 1445  WBC 9.9  NEUTROABS 8.6*  HGB 12.3  HCT 37.1  MCV 94.2  PLT 240   Basic Metabolic Panel: Recent Labs  Lab 08/28/22 1445 08/29/22 0404  NA 136 137  K 4.0 3.8  CL 101 103  CO2 25 28  GLUCOSE 121* 143*  BUN 11 9  CREATININE 0.61 0.58  CALCIUM 9.1 8.7*   Discharge time spent: 35 minutes.  Signed: Jacquelin Hawking, MD [facility] 09/01/2022

## 2022-09-01 NOTE — Progress Notes (Signed)
Mobility Specialist Progress Note:   09/01/22 1057  Mobility  Activity Ambulated independently in hallway  Level of Assistance Independent  Assistive Device None  Distance Ambulated (ft) 500 ft  Activity Response Tolerated well  Mobility Referral Yes  $Mobility charge 1 Mobility   Pre-mobility: 92% SpO2 on RA During mobility: 88% SpO2 on RA Post-mobility: 91% SpO2 on RA  Pt received in chair and agreeable. Performed ambulating O2 sat test per RN request. C/o coughing fits, no c/o pain or SOB. Experienced 1x temporary LOB, self corrected. Pt left in chair with all needs met and call bell in reach.   Andrey Campanile Mobility Specialist Please contact via SecureChat or  Rehab office at [phone]
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
## 2022-09-01 NOTE — Progress Notes (Incomplete)
PROGRESS NOTE    Katherine Foley  QJF:[id number] DOB: [birth year] DOA: 08/28/2022 PCP: Marin Olp, MD   Brief Narrative: Katherine Foley is a 71 y.o. female with a history of CAD s/p PCI, CABG, aortic stenosis s/p TAVR, hypertension, COPD, anxiety, depression. Patient presented secondary to shortness of breath and found to have evidence of acute respiratory failure, COPD exacerbation and influenza A infection. Tamiflu, Solu-medrol, bronchodilators and supplemental oxygen provided.   Assessment and Plan: * Acute respiratory failure with hypoxia ([place]) Secondary to COPD exacerbation. Associated dyspnea on exertion. Attempted to wean oxygen, however, patient dropped to SpO2 of 84% with associated dyspnea.  -Wean to room air as able -Ambulatory pulse ox, daily  Influenza A Patient started on Tamiflu. -Continue Tamiflu x5 day total course  COPD with acute exacerbation ([place]) Likely secondary to influenza infection. -Continue Solu-medrol and Duonebs -Add scheduled Duonebs -Continue doxycycline  Coronary artery disease involving native coronary artery with angina pectoris (HCC) -Continue aspirin and Imdur  Essential hypertension -Continue Imdur and amlodipine  HLD (hyperlipidemia) -Continue Crestor and Zetia    DVT prophylaxis: Lovenox Code Status:   Code Status: Full Code Family Communication: None at bedside Disposition Plan: Discharge home in 1-2 days pending ability to wean oxygen to room air if able, and improvement of functional capacity   Consultants:  None  Procedures:  None  Antimicrobials: Doxycycline    Subjective: Some improvement in dyspnea. Patient with continued significant dyspnea on exertion with associated hypoxia; functional capacity not at baseline  Objective: BP 109/74 (BP Location: Left Arm)   Pulse 82   Temp 98.2 F (36.8 C) (Oral)   Resp 16   Wt 55.8 kg   SpO2 92%   BMI 22.52 kg/m   Examination:  General exam:  Appears calm and comfortable Respiratory system: Mild diffuse wheezing with bilateral lower lobe rales. Respiratory effort normal. Cardiovascular system: S1 & S2 heard, RRR. No murmurs, rubs, gallops or clicks. Gastrointestinal system: Abdomen is nondistended, soft and nontender. Normal bowel sounds heard. Central nervous system: Alert and oriented. No focal neurological deficits. Musculoskeletal: No edema. No calf tenderness Skin: No cyanosis. No rashes Psychiatry: Judgement and insight appear normal. Mood & affect appropriate.    Data Reviewed: I have personally reviewed following labs and imaging studies  CBC Lab Results  Component Value Date   WBC 9.9 08/28/2022   RBC 3.94 08/28/2022   HGB 12.3 08/28/2022   HCT 37.1 08/28/2022   MCV 94.2 08/28/2022   MCH 31.2 08/28/2022   PLT 240 08/28/2022   MCHC 33.2 08/28/2022   RDW 13.6 08/28/2022   LYMPHSABS 0.7 08/28/2022   MONOABS 0.4 08/28/2022   EOSABS 0.1 08/28/2022   BASOSABS 0.0 89/37/3428     Last metabolic panel Lab Results  Component Value Date   NA 137 08/29/2022   K 3.8 08/29/2022   CL 103 08/29/2022   CO2 28 08/29/2022   BUN 9 08/29/2022   CREATININE 0.58 08/29/2022   GLUCOSE 143 (H) 08/29/2022   GFRNONAA >60 08/29/2022   GFRAA 109 07/01/2019   CALCIUM 8.7 (L) 08/29/2022   PROT 6.3 03/05/2022   ALBUMIN 4.2 03/05/2022   LABGLOB 2.3 07/01/2019   AGRATIO 1.8 07/01/2019   BILITOT 0.4 03/05/2022   ALKPHOS 48 03/05/2022   AST 35 03/05/2022   ALT 32 03/05/2022   ANIONGAP 6 08/29/2022    GFR: Estimated Creatinine Clearance: 49.5 mL/min (by C-G formula based on SCr of 0.58 mg/dL).  Recent Results (from the past  240 hour(s))  Resp panel by RT-PCR (RSV, Flu A&B, Covid) Anterior Nasal Swab     Status: Abnormal   Collection Time: 08/28/22  2:30 PM   Specimen: Anterior Nasal Swab  Result Value Ref Range Status   SARS Coronavirus 2 by RT PCR NEGATIVE NEGATIVE Final    Comment: (NOTE) SARS-CoV-2 target nucleic  acids are NOT DETECTED.  The SARS-CoV-2 RNA is generally detectable in upper respiratory specimens during the acute phase of infection. The lowest concentration of SARS-CoV-2 viral copies this assay can detect is 138 copies/mL. A negative result does not preclude SARS-Cov-2 infection and should not be used as the sole basis for treatment or other patient management decisions. A negative result may occur with  improper specimen collection/handling, submission of specimen other than nasopharyngeal swab, presence of viral mutation(s) within the areas targeted by this assay, and inadequate number of viral copies(<138 copies/mL). A negative result must be combined with clinical observations, patient history, and epidemiological information. The expected result is Negative.  Fact Sheet for Patients:  EntrepreneurPulse.com.au  Fact Sheet for Healthcare Providers:  IncredibleEmployment.be  This test is no t yet approved or cleared by the [place] FDA and  has been authorized for detection and/or diagnosis of SARS-CoV-2 by FDA under an Emergency Use Authorization (EUA). This EUA will remain  in effect (meaning this test can be used) for the duration of the COVID-19 declaration under Section 564(b)(1) of the Act, 21 U.S.C.section 360bbb-3(b)(1), unless the authorization is terminated  or revoked sooner.       Influenza A by PCR POSITIVE (A) NEGATIVE Final   Influenza B by PCR NEGATIVE NEGATIVE Final    Comment: (NOTE) The Xpert Xpress SARS-CoV-2/FLU/RSV plus assay is intended as an aid in the diagnosis of influenza from Nasopharyngeal swab specimens and should not be used as a sole basis for treatment. Nasal washings and aspirates are unacceptable for Xpert Xpress SARS-CoV-2/FLU/RSV testing.  Fact Sheet for Patients: EntrepreneurPulse.com.au  Fact Sheet for Healthcare  Providers: IncredibleEmployment.be  This test is not yet approved or cleared by the [place] FDA and has been authorized for detection and/or diagnosis of SARS-CoV-2 by FDA under an Emergency Use Authorization (EUA). This EUA will remain in effect (meaning this test can be used) for the duration of the COVID-19 declaration under Section 564(b)(1) of the Act, 21 U.S.C. section 360bbb-3(b)(1), unless the authorization is terminated or revoked.     Resp Syncytial Virus by PCR NEGATIVE NEGATIVE Final    Comment: (NOTE) Fact Sheet for Patients: EntrepreneurPulse.com.au  Fact Sheet for Healthcare Providers: IncredibleEmployment.be  This test is not yet approved or cleared by the [place] FDA and has been authorized for detection and/or diagnosis of SARS-CoV-2 by FDA under an Emergency Use Authorization (EUA). This EUA will remain in effect (meaning this test can be used) for the duration of the COVID-19 declaration under Section 564(b)(1) of the Act, 21 U.S.C. section 360bbb-3(b)(1), unless the authorization is terminated or revoked.  Performed at [facility], [address] [place] [place], [place] [address]       Radiology Studies: DG CHEST PORT 1 VIEW  Result Date: 09/01/2022 CLINICAL DATA:  Acute respiratory failure with hypoxia. EXAM: PORTABLE CHEST 1 VIEW COMPARISON:  08/28/2022 FINDINGS: Status post median sternotomy. Heart size is normal. There is minimal LEFT basilar pleural thickening or small pleural effusion, showing long-term stability. Lungs are clear otherwise. No edema. IMPRESSION: Minimal LEFT basilar pleural thickening or small pleural effusion. Electronically Signed   By:  Nolon Nations M.D.   On: 09/01/2022 07:17      LOS: 4 days    Cordelia Poche, MD [facility] 09/01/2022, 2:07 PM   If 7PM-7AM, please contact night-coverage [email or web address]

## 2022-09-01 NOTE — Discharge Instructions (Signed)
Katherine Foley,  You were in the hospital with influenza and a COPD exacerbation. You have improved to the point of not needing oxygen to function. Please continue your Tamiflu and medication for your COPD exacerbation. I have added two antibiotics to help with your exacerbation, in addition to steroids. Please follow-up with your PCP.

## 2022-09-01 NOTE — Progress Notes (Signed)
Nurse requested Mobility Specialist to perform oxygen saturation test with pt which includes removing pt from oxygen both at rest and while ambulating.  Below are the results from that testing.    Patient Saturations on Room Air at Rest = spO2 92%  Patient Saturations on Room Air while Ambulating = sp02 88% .  Rested and performed pursed lip breathing for 1 minute with sp02 at 90%.  At end of testing pt left in room on Room Air.  Reported results to nurse.

## 2022-09-03 LAB — LEGIONELLA PNEUMOPHILA SEROGP 1 UR AG: L. pneumophila Serogp 1 Ur Ag: NEGATIVE

## 2022-09-04 ENCOUNTER — Telehealth: Payer: Self-pay

## 2022-09-04 ENCOUNTER — Telehealth: Payer: Self-pay | Admitting: Pulmonary Disease

## 2022-09-04 ENCOUNTER — Telehealth: Payer: Self-pay | Admitting: *Deleted

## 2022-09-04 ENCOUNTER — Telehealth: Payer: Self-pay | Admitting: Family Medicine

## 2022-09-04 MED ORDER — BREZTRI AEROSPHERE 160-9-4.8 MCG/ACT IN AERO: 2.0000 | INHALATION_SPRAY | Freq: Two times a day (BID) | RESPIRATORY_TRACT | 6 refills | Status: DC

## 2022-09-04 NOTE — Telephone Encounter (Signed)
   Telephone encounter was:  Successful.  09/04/2022 Name: Katherine Foley MRN: [id number] DOB: [birth year]  Katherine Foley is a 72 y.o. year old female who is a primary care patient of Yong Channel, Brayton Mars, MD . The community resource team was consulted for assistance with  [place] guide performed the following interventions: Patient provided with information about care guide support team and interviewed to confirm resource needs.Patient needs housing ASAP will pay up to 800 works at [employer] and also [employer] losing room at the end of the month   Follow Up Plan:  Care guide will follow up with patient by phone over the next days [address] [address] , [place] [address] Email : Ashby Dawes. [email or web address]

## 2022-09-04 NOTE — Telephone Encounter (Signed)
See below

## 2022-09-04 NOTE — Telephone Encounter (Signed)
Patient states: - She was admitted to the hospital on 08/28/22 and discharged on 09/01/22 - Upon discharge was told to follow up with PCP within a week   PCP's next opening isn't until 09/18/22. Is there any way pt can be worked in sooner? Please Advise.

## 2022-09-04 NOTE — Telephone Encounter (Signed)
Could do 4 20 on Thursday 09/06/22- let her know we are already booked and may be wait and may have to come back for labs another day but wanted to prioritize seeing her

## 2022-09-04 NOTE — Telephone Encounter (Signed)
Called patient and verified that she was wanting Breztri sent into her [facility]. She even asked about her Duoneb treatments and I verified that they were sent in last week on 12/29. Nothing further needed

## 2022-09-04 NOTE — Patient Outreach (Addendum)
  Care Coordination TOC Note Transition Care Management Follow-up Telephone Call Date of discharge and from where: 09/01/22[facility] Dx: "COPD exacerbation" How have you been since you were released from the hospital? Patient pleased to report she is "breathing much better and feeling better." She has been able to resume pre-hospitalization activities without any issues. She still has a dry cough at time. Any questions or concerns? Yes-Patient states she did not get Duo-Nebs at times of discharge-only got machine. She has already called MD office and medicine was sent to [facility] and to be delivered today. Patient reports that her pharmacy was out of Tamiflu and med to be in stock today for her to pickup. She is ware to call RN CM back if she does not get meds today as planned.  Items Reviewed: Did the pt receive and understand the discharge instructions provided? Yes  Medications obtained and verified? Yes  Other? No  Any new allergies since your discharge? No  Dietary orders reviewed? Yes Do you have support at home? Yes   Home Care and Equipment/Supplies: Were home health services ordered? not applicable If so, what is the name of the agency? N/A  Has the agency set up a time to come to the patient's home? not applicable Were any new equipment or medical supplies ordered?  No What is the name of the medical supply agency? N/A Were you able to get the supplies/equipment? not applicable Do you have any questions related to the use of the equipment or supplies? No  Functional Questionnaire: (I = Independent and D = Dependent) ADLs: I  Bathing/Dressing- I  Meal Prep- I  Eating- I  Maintaining continence- I  Transferring/Ambulation- I  Managing Meds- I  Follow up appointments reviewed:  PCP Hospital f/u appt confirmed?  Patient has already called PCP office this morning to schedule an appt. MD has no openings soon-office working on it and will call her back   . [facility] f/u appt confirmed?  N/A  . Are transportation arrangements needed? No  If their condition worsens, is the pt aware to call PCP or go to the Emergency Dept.? Yes Was the patient provided with contact information for the PCP's office or ED? Yes Was to pt encouraged to call back with questions or concerns? Yes  SDOH assessments and interventions completed:   Yes SDOH Interventions Today    Flowsheet Row Most Recent Value  SDOH Interventions   Food Insecurity Interventions Intervention Not Indicated  Housing Interventions --  [place] guide has attempted to reach pt-provided pt with contact info to follow back up]  Transportation Interventions Intervention Not Indicated     Patient reports she is staying with a friend currently. However, friend will be moving to a new location soon and she will not be able to move with friend. She is looking for housing resources. Advised that care guide had tried to reach her with no response. Provided with contact info to call.  Care Coordination Interventions:  Education provided    Encounter Outcome:  Pt. Visit Completed    Enzo Montgomery, RN,BSN,CCM [facility] Telephonic Care Management Coordinator Direct Phone: [phone] Toll Free: [phone] Fax: [phone]

## 2022-09-04 NOTE — Telephone Encounter (Signed)
   Telephone encounter was:  Successful.  09/04/2022 Name: Katherine Foley MRN: [id number] DOB: [birth year]  Katherine Foley is a 72 y.o. year old female who is a primary care patient of Yong Channel, Brayton Mars, MD . The community resource team was consulted for assistance with  Housing   Care guide performed the following interventions: Patient provided with information about care guide support team and interviewed to confirm resource needs.  Follow Up Plan:  Care guide will follow up with patient by phone over the next days  [place] [phone] [address] , [place] [address] Email : Ashby Dawes. [email or web address] [email or web address]

## 2022-09-05 ENCOUNTER — Other Ambulatory Visit: Payer: Self-pay | Admitting: Family Medicine

## 2022-09-06 ENCOUNTER — Ambulatory Visit
Admission: RE | Admit: 2022-09-06 | Discharge: 2022-09-06 | Disposition: A | Discharge status: Home / Self Care | Payer: Medicare HMO | Source: Ambulatory Visit | Attending: Pulmonary Disease | Admitting: Pulmonary Disease

## 2022-09-06 ENCOUNTER — Encounter: Payer: Self-pay | Admitting: Family Medicine

## 2022-09-06 ENCOUNTER — Ambulatory Visit (INDEPENDENT_AMBULATORY_CARE_PROVIDER_SITE_OTHER): Payer: Medicare HMO | Admitting: Family Medicine

## 2022-09-06 VITALS — BP 124/60 | HR 77 | Temp 97.2°F | Ht 62.0 in | Wt 127.8 lb

## 2022-09-06 DIAGNOSIS — F3341 Major depressive disorder, recurrent, in partial remission: Secondary | ICD-10-CM | POA: Insufficient documentation

## 2022-09-06 DIAGNOSIS — I712 Thoracic aortic aneurysm, without rupture, unspecified: Secondary | ICD-10-CM | POA: Diagnosis not present

## 2022-09-06 DIAGNOSIS — I25119 Atherosclerotic heart disease of native coronary artery with unspecified angina pectoris: Secondary | ICD-10-CM

## 2022-09-06 DIAGNOSIS — I700 Atherosclerosis of aorta: Secondary | ICD-10-CM

## 2022-09-06 DIAGNOSIS — J441 Chronic obstructive pulmonary disease with (acute) exacerbation: Secondary | ICD-10-CM

## 2022-09-06 DIAGNOSIS — R69 Illness, unspecified: Secondary | ICD-10-CM | POA: Diagnosis not present

## 2022-09-06 DIAGNOSIS — J984 Other disorders of lung: Secondary | ICD-10-CM | POA: Diagnosis not present

## 2022-09-06 DIAGNOSIS — F3342 Major depressive disorder, recurrent, in full remission: Secondary | ICD-10-CM | POA: Diagnosis not present

## 2022-09-06 DIAGNOSIS — J398 Other specified diseases of upper respiratory tract: Secondary | ICD-10-CM | POA: Diagnosis not present

## 2022-09-06 NOTE — Progress Notes (Signed)
Phone [phone]   Subjective:  Katherine Foley is a 72 y.o. year old very pleasant female patient who presents for transitional care management and hospital follow up for COPD exacerbation/influenza. Patient was hospitalized from 08/28/2022 to 09/03/2022. A TCM phone call was completed on 09/04/22. Medical complexity moderate - Of note given patient substantial improvement does not meet qualification for TCM  Patient presented to the hospital with shortness of breath and found to be in acute respiratory failure due to combination of COPD exacerbation and influenza A infection.  For acute respiratory failure with hypoxia-thankfully resolved by September 03, 2022-thought secondary to COPD exacerbation.  Initially required oxygen-but Before discharge patient had ambulatory oxygen testing did not qualify for oxygen  For  COPD with exacerbation-likely triggered by influenza-patient was managed with Solu-Medrol IV, bronchodilators and doxycycline.  She was also given cefdinir prior to discharge and sent home with this along with short steroid taper  -She was discharged on Breztri-had recently been changed to this by pulmonology  For influenza A patient was treated with Tamiflu due to severity of illness  Patient with underlying chronic medical conditions: Coronary artery disease-maintained on aspirin and Imdur as well as statin and Zetia Hypertension-maintained on Imdur and amlodipine 5 mg Hyperlipidemia maintained on Crestor and Zetia  Notes tremendous improvement at home- did not know how sick she was until she improved. Had barely been able to walk her dog and now can without significant difficulty. Up to walking 30 minutes. Breathing treatments once or twice a day now. Is slightly shaky on prednisone.      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    Priority: High   Depression 09/22/2015    Priority: High   Former smoker 09/22/2015    Priority: High   S/P AVR (aortic valve replacement) 11/06/2013    Priority: High   Aortic stenosis 07/07/2010    Priority: High   Coronary artery disease involving native coronary artery with angina pectoris ([place]) 05/17/2010    Priority: High   COPD with acute exacerbation ([place]) 07/23/2007    Priority: High   Aortic atherosclerosis ([place]) 03/10/2020    Priority: Medium    Restless legs 08/25/2019    Priority: Medium    Hot flashes 03/22/2016    Priority: Medium    Diastolic dysfunction 57/26/2035    Priority: Medium    GERD (gastroesophageal reflux disease) 06/04/2014    Priority: Medium    Carotid stenosis 05/17/2010    Priority: Medium    HLD (hyperlipidemia) 08/29/2009    Priority: Medium    Essential hypertension 04/14/2007    Priority: Medium    Osteopenia 04/14/2007    Priority: Medium    Senile purpura ([place]) 03/05/2022    Priority: Low   Genetic testing 02/10/2018    Priority: Low   Family history of breast cancer     Priority: Low   Allergic rhinitis 09/22/2015    Priority: Low   Adenomatous colon polyp     Priority: Low   Herpes simplex virus (HSV) infection 01/16/2010    Priority: Low   Recurrent major depressive disorder, in partial remission ([place]) 09/06/2022   Influenza A 08/28/2022   COVID-19 07/31/2022   Hypokalemia 07/31/2022   Anemia 07/31/2022   Coronary artery disease involving native coronary artery of native heart without angina  pectoris 07/23/2022   Precordial chest pain 11/06/2013    Medications- reviewed and updated  A medical reconciliation was performed comparing current medicines to hospital discharge medications. Current Outpatient Medications  Medication Sig Dispense Refill   acetaminophen (TYLENOL) 650 MG CR tablet Take 1,300 mg by mouth every 8 (eight) hours as needed for pain.     albuterol (VENTOLIN HFA) 108 (90 Base)  MCG/ACT inhaler Inhale 2 puffs into the lungs every 6 (six) hours as needed for wheezing or shortness of breath. 8 g 3   amLODipine (NORVASC) 5 MG tablet TAKE ONE TABLET BY MOUTH ONCE DAILY 90 tablet 3   aspirin EC 81 MG tablet Take 1 tablet (81 mg total) by mouth daily.     benzonatate (TESSALON) 200 MG capsule Take 1 capsule (200 mg total) by mouth 3 (three) times daily. 20 capsule 0   Budeson-Glycopyrrol-Formoterol (BREZTRI AEROSPHERE) 160-9-4.8 MCG/ACT AERO Inhale 2 puffs into the lungs in the morning and at bedtime. 1 each 6   buPROPion (WELLBUTRIN XL) 300 MG 24 hr tablet TAKE ONE TABLET BY MOUTH ONCE DAILY (Patient taking differently: Take 300 mg by mouth daily.) 30 tablet 5   Calcium Carb-Cholecalciferol (CALCIUM 500/D PO) Take 2 tablets by mouth daily. Mag and zinc added     cetirizine (ZYRTEC) 10 MG tablet Take 10 mg by mouth at bedtime.     desvenlafaxine (PRISTIQ) 100 MG 24 hr tablet TAKE ONE TABLET BY MOUTH EVERY MORNING (Patient taking differently: Take 100 mg by mouth at bedtime.) 90 tablet 2   dexlansoprazole (DEXILANT) 60 MG capsule TAKE ONE CAPSULE BY MOUTH ONCE DAILY (Patient taking differently: Take 60 mg by mouth daily.) 90 capsule 3   ezetimibe (ZETIA) 10 MG tablet TAKE ONE TABLET BY MOUTH ONCE DAILY 90 tablet 1   feeding supplement (ENSURE ENLIVE / ENSURE PLUS) LIQD Take 237 mLs by mouth 2 (two) times daily between meals.     fluticasone (CUTIVATE) 0.05 % cream Apply 1 Application topically 2 (two) times daily as needed (eczema).     fluticasone (FLONASE) 50 MCG/ACT nasal spray Place 2 sprays into both nostrils as needed for allergies or rhinitis. 16 g 3   gabapentin (NEURONTIN) 300 MG capsule TAKE ONE CAPSULE BY MOUTH EVERYDAY AT BEDTIME (Patient taking differently: Take 300 mg by mouth at bedtime.) 90 capsule 3   ipratropium-albuterol (DUONEB) 0.5-2.5 (3) MG/3ML SOLN Take 3 mLs by nebulization every 6 (six) hours as needed. 360 mL 4   isosorbide mononitrate (IMDUR) 30 MG 24 hr  tablet Take 0.5 tablets (15 mg total) by mouth daily. 45 tablet 3   LORazepam (ATIVAN) 0.5 MG tablet TAKE ONE TABLET BY MOUTH twice daily AS NEEDED FOR ANXIETY. Do not drive 8 hours after using. 60 tablet 1   Multiple Vitamins-Minerals (MULTIVITAMIN ADULT PO) Take 1 tablet by mouth daily.     nitroGLYCERIN (NITROSTAT) 0.4 MG SL tablet Place 1 tablet (0.4 mg total) under the tongue every 5 (five) minutes as needed for chest pain (if pain does not resolve with 2 doses, call 911). 30 tablet 5   predniSONE (DELTASONE) 10 MG tablet Take 4 tablets (40 mg total) by mouth daily with breakfast for 2 days, THEN 2 tablets (20 mg total) daily with breakfast for 3 days, THEN 1 tablet (10 mg total) daily with breakfast for 3 days. 17 tablet 0   rosuvastatin (CRESTOR) 40 MG tablet Take 1 tablet (40 mg total) by mouth daily. 90 tablet 2   valACYclovir (VALTREX) 1000  MG tablet Take 1,000 mg by mouth daily as needed (for fever blisters).     No current facility-administered medications for this visit.   Objective  Objective:  BP 124/60   Pulse 77   Temp (!) 97.2 F (36.2 C)   Ht '5\' 2"'$  (1.575 m)   Wt 127 lb 12.8 oz (58 kg)   SpO2 97%   BMI 23.37 kg/m  Gen: NAD, resting comfortably CV: RRR stable murmur Lungs: CTAB no crackles, wheeze, rhonchi Ext: no edema Skin: warm, dry    Assessment and Plan:   #COPD/former smoker-sees Dr. [place] Bells S: Medication:breztri -Significant hospitalization December 2023 with influenza A causing respiratory failure A/P: Patient thankfully has recovered quite well-she still has prednisone to complete but has finished her antibiotic course.  She will maintain on breztri for maintenance - She agrees to schedule follow-up if new or worsening symptoms  #CAD-Status post prior PCI to the RCA and RI and subsequent CABG in 2012.  Cardiac catheterization in 2019 demonstrated patent stents in the RCA and RI, atretic LIMA-LAD and mild nonobstructive disease in the LAD.  -Also had  cath on 07/23/2022-plan was medical management #hyperlipidemia with aortic atherosclerosis S: Medication:Aspirin 81 mg, Zetia 10 mg, rosuvastatin 40 mg.  No chest pain.  Improving shortness of breath Lab Results  Component Value Date   CHOL 112 03/05/2022   HDL 51.20 03/05/2022   LDLCALC 44 03/05/2022   LDLDIRECT 65.0 06/19/2017   TRIG 86.0 03/05/2022   CHOLHDL 2 03/05/2022    A/P: CAD-without chest pain and shortness of breath improving likely related to COPD-continue current medication Hyperlipidemia-LDL below 70 and even below more ideal goal of 55 or less-continue current medication Aortic atherosclerosis-at ideal goal-continue current medication  % Aortic valve disease status post aortic valve replacement-follows with cardiology including echocardiograms and has SBE prophylaxis-murmur noted and stable today.  Most recent echo 06/11/2022   #hypertension S: medication: Amlodipine 5 mg, Imdur 30 mg A/P: Controlled. Continue current medications.  # Depression # Anxiety S:Medication: Pristiq 100 mg, Wellbutrin 300 mg extended release    09/06/2022    4:00 PM 06/07/2022    8:24 AM 03/05/2022    8:26 AM  Depression screen PHQ 2/9  Decreased Interest '1 2 2  '$ Down, Depressed, Hopeless '1 3 3  '$ PHQ - 2 Score '2 5 5  '$ Altered sleeping 0 3 3  Tired, decreased energy 0 3 3  Change in appetite 0 0 0  Feeling bad or failure about yourself  0 0 0  Trouble concentrating 0 0 0  Moving slowly or fidgety/restless 0 0 0  Suicidal thoughts 0 0 0  PHQ-9 Score '2 11 11  '$ Difficult doing work/chores Not difficult at all  Somewhat difficult  A/P: Was tough as she had to miss any family gatherings or gatherings with friends due to illness over the holidays but particularly in light of that [place] 2 is very well-controlled-plan was to continue current medication  Recommended follow up: Keep April visit or sooner if needed Future Appointments  Date Time Provider [place]  09/21/2022 10:00 AM [facility]  [facility] None  09/21/2022 11:00 AM Martyn Ehrich, NP [facility] None  09/24/2022  2:00 PM [facility] CCM PHARMACIST [facility]  12/14/2022  2:00 PM Marin Olp, MD [facility]  01/25/2023  9:20 AM Sherren Mocha, MD [facility]  06/10/2023  8:30 AM [facility] HEALTH COACH [facility]    Lab/Order associations:   ICD-10-CM   1. COPD with acute exacerbation (  [place])  J44.1     2. Coronary artery disease involving native coronary artery of native heart with angina pectoris ([place])  I25.119     3. Recurrent major depressive disorder, in full remission ([place])  F33.42     4. Aortic atherosclerosis ([facility]) Chronic I70.0      Return precautions advised.  Garret Reddish, MD

## 2022-09-06 NOTE — Patient Instructions (Addendum)
Thrilled you are doing so well! No changes today- finish the prednisone  Recommended follow up: keep April 12th visit with me but cancel visit on January 17th since you are so much better!

## 2022-09-14 ENCOUNTER — Other Ambulatory Visit: Payer: Self-pay | Admitting: Family Medicine

## 2022-09-18 NOTE — Progress Notes (Signed)
Care Management & Coordination Services Pharmacy Note  09/24/2022 Name:  Katherine Foley MRN:  [id number] DOB:  1951/07/22  Summary: PharmD FU visit.  Patient doing well considering recent COPD exacerbation.  Reports consistent Breztri use with minimal rescue inhaler usage.  Only if she really over exerts herself.  Copay manageable, please reach out if any copay concerns.  Really concerned about her living situation and is causing stress. Living with her friend but friend is putting house on market.  Previously in contact with social work but did not work out any plans - will try to reconnect.  Recommendations/Changes made from today's visit: Continue Breztri use daily Reach out with housing concerns  Follow up plan: FU 6 months CMA to FU on housing in 1 month   Subjective: Katherine Foley is an 72 y.o. year old female who is a primary patient of Yong Channel, Brayton Mars, MD.  The care coordination team was consulted for assistance with disease management and care coordination needs.    Engaged with patient by telephone for follow up visit.  Recent office visits:  07/24/2022 OV (Fam Med) Inda Coke, [place]; Start amoxicillin 500 mg BID x 10 days , Start oral prednisone as prescribed    Recent consult visits:  07/31/2022 OV (Cardiology) Richardson Dopp T, PA-C; no medication changes indicated.   07/11/2022 OV (Cardiology) Richardson Dopp T, PA-C; no medication changes indicated.   Hospital visits:  07/23/2022 Admission for Right Heart Cath   Objective:  Lab Results  Component Value Date   CREATININE 0.58 08/29/2022   BUN 9 08/29/2022   GFR 84.66 03/05/2022   EGFR 90 07/31/2022   GFRNONAA >60 08/29/2022   GFRAA 109 07/01/2019   NA 137 08/29/2022   K 3.8 08/29/2022   CALCIUM 8.7 (L) 08/29/2022   CO2 28 08/29/2022   GLUCOSE 143 (H) 08/29/2022    Lab Results  Component Value Date/Time   HGBA1C 6.2 (H) 04/27/2011 11:59 AM   GFR 84.66 03/05/2022 09:12 AM   GFR 116.27  03/09/2020 11:51 AM    Last diabetic Eye exam: No results found for: "HMDIABEYEEXA"  Last diabetic Foot exam: No results found for: "HMDIABFOOTEX"   Lab Results  Component Value Date   CHOL 112 03/05/2022   HDL 51.20 03/05/2022   LDLCALC 44 03/05/2022   LDLDIRECT 65.0 06/19/2017   TRIG 86.0 03/05/2022   CHOLHDL 2 03/05/2022       Latest Ref Rng & Units 03/05/2022    9:12 AM 12/27/2020    3:42 PM 09/26/2020    9:25 AM  Hepatic Function  Total Protein 6.0 - 8.3 g/dL 6.3  6.1  6.3   Albumin 3.5 - 5.2 g/dL 4.2  4.3  4.5   AST 0 - 37 U/L 35  33  18   ALT 0 - 35 U/L 32  39  14   Alk Phosphatase 39 - 117 U/L 48  71  65   Total Bilirubin 0.2 - 1.2 mg/dL 0.4  0.3  0.3   Bilirubin, Direct 0.00 - 0.40 mg/dL  <0.10  <0.10     Lab Results  Component Value Date/Time   TSH 0.55 02/01/2017 09:24 AM   TSH 0.384 (L) 12/12/2016 04:15 PM       Latest Ref Rng & Units 08/28/2022    2:45 PM 08/23/2022    9:57 AM 07/31/2022   11:43 AM  CBC  WBC 4.0 - 10.5 K/uL 9.9  3.8  9.9   Hemoglobin 12.0 - 15.0  g/dL 12.3  12.1  11.9   Hematocrit 36.0 - 46.0 % 37.1  37.2  36.6   Platelets 150 - 400 K/uL 240  177.0  223     Lab Results  Component Value Date/Time   VITAMINB12 525 03/09/2020 11:51 AM   VITAMINB12 550 06/22/2016 01:29 PM    Clinical ASCVD: Yes  The ASCVD Risk score (Arnett DK, et al., 2019) failed to calculate for the following reasons:   The patient has a prior MI or stroke diagnosis         09/06/2022    4:00 PM 06/07/2022    8:24 AM 03/05/2022    8:26 AM  Depression screen PHQ 2/9  Decreased Interest '1 2 2  '$ Down, Depressed, Hopeless '1 3 3  '$ PHQ - 2 Score '2 5 5  '$ Altered sleeping 0 3 3  Tired, decreased energy 0 3 3  Change in appetite 0 0 0  Feeling bad or failure about yourself  0 0 0  Trouble concentrating 0 0 0  Moving slowly or fidgety/restless 0 0 0  Suicidal thoughts 0 0 0  PHQ-9 Score '2 11 11  '$ Difficult doing work/chores Not difficult at all  Somewhat difficult      Social History   Tobacco Use  Smoking Status Former   Packs/day: 1.50   Years: 44.00   Total pack years: 66.00   Types: Cigarettes   Quit date: 09/03/2005   Years since quitting: 17.0  Smokeless Tobacco Never   BP Readings from Last 3 Encounters:  09/21/22 118/60  09/06/22 124/60  09/01/22 112/71   Pulse Readings from Last 3 Encounters:  09/21/22 73  09/06/22 77  09/01/22 78   Wt Readings from Last 3 Encounters:  09/21/22 128 lb (58.1 kg)  09/06/22 127 lb 12.8 oz (58 kg)  08/30/22 123 lb 1.6 oz (55.8 kg)   BMI Readings from Last 3 Encounters:  09/21/22 23.41 kg/m  09/06/22 23.37 kg/m  08/30/22 22.52 kg/m    Allergies  Allergen Reactions   Codeine Sulfate Itching and Rash   Hydrocodone-Acetaminophen Itching and Rash    Medications Reviewed Today     Reviewed by June Leap, CMA (Certified Medical Assistant) on 09/21/22 at 1126  Med List Status: <None>   Medication Order Taking? Sig Documenting Provider Last Dose Status Informant  acetaminophen (TYLENOL) 650 MG CR tablet [id number] Yes Take 1,300 mg by mouth every 8 (eight) hours as needed for pain. [provider] Taking Active Self    Discontinued 11/09/11 1414   albuterol (VENTOLIN HFA) 108 (90 Base) MCG/ACT inhaler [id number] Yes Inhale 2 puffs into the lungs every 6 (six) hours as needed for wheezing or shortness of breath. Marin Olp, MD Taking Active Self  amLODipine (NORVASC) 5 MG tablet [id number] Yes TAKE ONE TABLET BY MOUTH ONCE DAILY Marin Olp, MD Taking Active Self  aspirin EC 81 MG tablet [id number] Yes Take 1 tablet (81 mg total) by mouth daily. Richardson Dopp T, PA-C Taking Active Self  benzonatate (TESSALON) 200 MG capsule [id number] No Take 1 capsule (200 mg total) by mouth 3 (three) times daily.  Patient not taking: Reported on 09/21/2022   Mariel Aloe, MD Not Taking Active   Budeson-Glycopyrrol-Formoterol [facility] AEROSPHERE) 160-9-4.8 MCG/ACT AERO [id number] Yes  Inhale 2 puffs into the lungs in the morning and at bedtime. Juanito Doom, MD Taking Active   buPROPion (WELLBUTRIN XL) 300 MG 24 hr tablet [id number] Yes TAKE ONE TABLET BY MOUTH ONCE  DAILY Marin Olp, MD Taking Active   Calcium Carb-Cholecalciferol (CALCIUM 500/D PO) [id number] Yes Take 2 tablets by mouth daily. Mag and zinc added [provider] Taking Active Self  cetirizine (ZYRTEC) 10 MG tablet [id number] Yes Take 10 mg by mouth at bedtime. [provider] Taking Active Self  desvenlafaxine (PRISTIQ) 100 MG 24 hr tablet [id number] Yes TAKE ONE TABLET BY MOUTH EVERY MORNING  Patient taking differently: Take 100 mg by mouth at bedtime.   Marin Olp, MD Taking Active Self  dexlansoprazole (DEXILANT) 60 MG capsule [id number] Yes TAKE ONE CAPSULE BY MOUTH ONCE DAILY  Patient taking differently: Take 60 mg by mouth daily.   Marin Olp, MD Taking Active Self  ezetimibe (ZETIA) 10 MG tablet [id number] Yes TAKE ONE TABLET BY MOUTH ONCE DAILY Richardson Dopp T, PA-C Taking Active Self  feeding supplement (ENSURE ENLIVE / ENSURE PLUS) LIQD [id number] Yes Take 237 mLs by mouth 2 (two) times daily between meals. Mariel Aloe, MD Taking Active   fluticasone (CUTIVATE) 0.05 % cream [id number] Yes Apply 1 Application topically 2 (two) times daily as needed (eczema). [provider] Taking Active Self  fluticasone (FLONASE) 50 MCG/ACT nasal spray [id number] Yes Place 2 sprays into both nostrils as needed for allergies or rhinitis. Marin Olp, MD Taking Active Self  gabapentin (NEURONTIN) 300 MG capsule [id number] Yes TAKE ONE CAPSULE BY MOUTH EVERYDAY AT BEDTIME  Patient taking differently: Take 300 mg by mouth at bedtime.   Marin Olp, MD Taking Active Self  ipratropium-albuterol (DUONEB) 0.5-2.5 (3) MG/3ML SOLN [id number] Yes Take 3 mLs by nebulization every 6 (six) hours as needed. Juanito Doom, MD Taking Active   isosorbide mononitrate  (IMDUR) 30 MG 24 hr tablet [id number] Yes Take 0.5 tablets (15 mg total) by mouth daily. Richardson Dopp T, PA-C Taking Active Self  LORazepam (ATIVAN) 0.5 MG tablet [id number] Yes TAKE ONE TABLET BY MOUTH twice daily AS NEEDED FOR ANXIETY. Do not drive 8 hours after using. Marin Olp, MD Taking Active   Multiple Vitamins-Minerals (MULTIVITAMIN ADULT PO) [id number] Yes Take 1 tablet by mouth daily. [provider] Taking Active Self  nitroGLYCERIN (NITROSTAT) 0.4 MG SL tablet [id number] Yes Place 1 tablet (0.4 mg total) under the tongue every 5 (five) minutes as needed for chest pain (if pain does not resolve with 2 doses, call 911). Richardson Dopp T, PA-C Taking Active Self  rosuvastatin (CRESTOR) 40 MG tablet [id number] Yes Take 1 tablet (40 mg total) by mouth daily. Marin Olp, MD Taking Active Self  valACYclovir (VALTREX) 1000 MG tablet [id number] Yes Take 1,000 mg by mouth daily as needed (for fever blisters). [provider] Taking Active Self            SDOH:  (Social Determinants of Health) assessments and interventions performed: No, done within year Financial Resource Strain: Low Risk  (06/07/2022)   Overall Financial Resource Strain (CARDIA)    Difficulty of Paying Living Expenses: Not very hard    SDOH Interventions    Flowsheet Row Office Visit from 09/06/2022 in [place] Telephone from 09/04/2022 in [place] from 06/07/2022 in [place] from 05/09/2022 in [place] Visit from 03/05/2022 in [place] Video Visit from 03/31/2021 in [place]  SDOH Interventions        Food Insecurity Interventions -- Intervention Not Indicated Intervention  Not Indicated Intervention Not Indicated -- --  Housing Interventions -- --  Dossie Arbour guide has  attempted to reach pt-provided pt with contact info to follow back up] Intervention Not Indicated Intervention Not Indicated -- --  Transportation Interventions -- Intervention Not Indicated Intervention Not Indicated Intervention Not Indicated -- --  Utilities Interventions -- -- -- Intervention Not Indicated -- --  Depression Interventions/Treatment  PHQ2-9 Score <4 Follow-up Not Indicated -- Counseling -- Counseling Medication, Counseling  Financial Strain Interventions -- -- Intervention Not Indicated Intervention Not Indicated -- --  Physical Activity Interventions -- -- Intervention Not Indicated -- -- --  Stress Interventions -- -- Intervention Not Indicated -- -- --  Social Connections Interventions -- -- Intervention Not Indicated -- -- --      SDOH Screenings   Food Insecurity: No Food Insecurity (09/04/2022)  Housing: High Risk (09/04/2022)  Transportation Needs: No Transportation Needs (09/04/2022)  Utilities: Not At Risk (05/09/2022)  Depression (PHQ2-9): Low Risk  (09/06/2022)  Financial Resource Strain: Low Risk  (06/07/2022)  Physical Activity: Sufficiently Active (06/07/2022)  Social Connections: Moderately Isolated (06/07/2022)  Stress: Stress Concern Present (06/07/2022)  Tobacco Use: Medium Risk (09/21/2022)    Medication Assistance: None required.  Patient affirms current coverage meets needs.  Medication Access: Within the past 30 days, how often has patient missed a dose of medication? 0 Is a pillbox or other method used to improve adherence? Yes  Factors that may affect medication adherence? no barriers identified Are meds synced by current pharmacy? Yes  Are meds delivered by current pharmacy? Yes  Does patient experience delays in picking up medications due to transportation concerns? No   [facility] Reviewed: Is patient disadvantaged to use [facility]?: No  Current Rx insurance plan: [employer] Name and location of Current pharmacy:  [facility] -  [place], [place] - [address] [place] [address] Phone: [phone] Fax: [phone]  [facility] [place], [place] [address] Phone: [phone] Fax: [phone]  [place] [address] Phone: [phone] Fax: [phone]  [facility] services reviewed with patient today?: No  Patient requests to transfer care to [facility]?: No  Reason patient declined to change pharmacies: Patient is already actively enrolled with [facility]  Compliance/Adherence/Medication fill history: Care Gaps: None  Star-Rating Drugs: Rosuvastatin '40mg'$  09/14/22 30ds   Assessment/Plan      COPD (Goal: control symptoms and prevent exacerbations) 09/24/22 -Controlled, per symptoms -Current treatment  Breztri 160-9-4.8 mcg 2 puffs twice daily Appropriate, Effective, Safe, Accessible Albuterol HFA 54mg prn Appropriate, Effective, Safe, Accessible -Gold Grade: Gold 2 (FEV1 50-79%) -Pulmonary function testing: Pulmonary Functions Testing Results:  Pulmonary Functions Testing Results:  TLC  Date Value Ref Range Status  09/21/2022 5.67 L Preliminary   -Exacerbations requiring treatment in last 6 months: one - December 2023 -Patient reports consistent use of maintenance inhaler -[facility]  Reports correct administration with limited use of rescue inhaler.  [place]. No changes at this time please reach out with copay barriers.  Depression/Anxiety (Goal: minimize recurring symptoms, ensure medication therapy) 09/24/22 -Not ideally controlled -Current treatment: Pristiq 100 mg once daily Appropriate, Effective, safe, Accessible  Wellbutrin XL '300mg'$  Appropriate,  Effective, safe, Accessible  -Educated on Benefits of medication  for symptom control -Recommended to continue current medication -Continues to do grief counseling, continues same living situation but her friend is putting the house up for sale at the end of the month.  This continues to worry her.  She did not receive any information from social work on housing options per her reports.  Has had a few options but they were upstairs which was not feasible for her.  No changes to meds at this time.  Try to re-connect with social work for housing options.   Patient Goals/Self-Care Activities Patient will:  - take medications as prescribed  Medication Assistance: Utilizing [facility] through [facility].  Patient's preferred pharmacy is:  [facility] - [place], [place] - [address] [place] [address] Phone: [phone] Fax: [phone]  Follow Up:  Patient agrees to Care Plan and Follow-up. Plan: FU 6 months.        Beverly Milch, PharmD Clinical Pharmacist  [facility] [phone]

## 2022-09-19 ENCOUNTER — Ambulatory Visit: Payer: Medicare HMO | Admitting: Family Medicine

## 2022-09-20 ENCOUNTER — Telehealth: Payer: Self-pay | Admitting: *Deleted

## 2022-09-20 NOTE — Telephone Encounter (Signed)
   Telephone encounter was:  Unsuccessful.  09/20/2022 Name: Katherine Foley MRN: [id number] DOB: 04-09-1951  Unsuccessful outbound call made today to assist with:   housing   Outreach Attempt:  3rd Attempt.  Referral closed unable to contact patient. Patient looking for housing have been unable to find any availability in her area   [place] [address] , [place] [address] Email : [email or web address]

## 2022-09-21 ENCOUNTER — Ambulatory Visit: Payer: Medicare HMO | Admitting: Emergency Medicine

## 2022-09-21 ENCOUNTER — Ambulatory Visit: Payer: Medicare HMO | Admitting: Primary Care

## 2022-09-21 ENCOUNTER — Encounter: Payer: Self-pay | Admitting: Primary Care

## 2022-09-21 ENCOUNTER — Telehealth: Payer: Self-pay | Admitting: Pharmacist

## 2022-09-21 VITALS — BP 118/60 | HR 73 | Ht 62.0 in | Wt 128.0 lb

## 2022-09-21 DIAGNOSIS — J441 Chronic obstructive pulmonary disease with (acute) exacerbation: Secondary | ICD-10-CM

## 2022-09-21 DIAGNOSIS — J309 Allergic rhinitis, unspecified: Secondary | ICD-10-CM | POA: Diagnosis not present

## 2022-09-21 DIAGNOSIS — J449 Chronic obstructive pulmonary disease, unspecified: Secondary | ICD-10-CM

## 2022-09-21 LAB — PULMONARY FUNCTION TEST
DL/VA % pred: 70 %
DL/VA: 2.94 ml/min/mmHg/L
DLCO cor % pred: 58 %
DLCO cor: 10.56 ml/min/mmHg
DLCO unc % pred: 56 %
DLCO unc: 10.18 ml/min/mmHg
FEF 25-75 Post: 0.48 L/sec
FEF 25-75 Pre: 0.51 L/sec
FEF2575-%Change-Post: -5 %
FEF2575-%Pred-Post: 29 %
FEF2575-%Pred-Pre: 31 %
FEV1-%Change-Post: -1 %
FEV1-%Pred-Post: 55 %
FEV1-%Pred-Pre: 56 %
FEV1-Post: 1.11 L
FEV1-Pre: 1.13 L
FEV1FVC-%Change-Post: -2 %
FEV1FVC-%Pred-Pre: 79 %
FEV6-%Change-Post: -1 %
FEV6-%Pred-Post: 74 %
FEV6-%Pred-Pre: 75 %
FEV6-Post: 1.87 L
FEV6-Pre: 1.9 L
FEV6FVC-%Change-Post: -1 %
FEV6FVC-%Pred-Post: 103 %
FEV6FVC-%Pred-Pre: 105 %
FVC-%Change-Post: 0 %
FVC-%Pred-Post: 72 %
FVC-%Pred-Pre: 71 %
FVC-Post: 1.91 L
FVC-Pre: 1.9 L
Post FEV1/FVC ratio: 58 %
Post FEV6/FVC ratio: 98 %
Pre FEV1/FVC ratio: 60 %
Pre FEV6/FVC Ratio: 100 %
RV % pred: 168 %
RV: 3.63 L
TLC % pred: 119 %
TLC: 5.67 L

## 2022-09-21 MED ORDER — IPRATROPIUM BROMIDE 0.03 % NA SOLN: 2.0000 | Freq: Two times a day (BID) | NASAL | 2 refills | Status: DC

## 2022-09-21 NOTE — Progress Notes (Signed)
Care Management & Coordination Services Pharmacy Team  Reason for Encounter: Appointment Reminder   Patient contacted to confirm telephone appointment with Leata Mouse PharmD, on 09/24/2022 at 2:00 PM.   Star Rating Drugs:  Rosuvastatin 40 mg last filled 09/14/2022 30 DS  Care Gaps: Annual wellness visit in last year? Yes  [name], [place] Pharmacist Assistant [phone]

## 2022-09-21 NOTE — Progress Notes (Signed)
$'@Patient'I$  ID: Katherine Foley, female    DOB: 1951/01/28, 72 y.o.   MRN: [id number]  Chief Complaint  Patient presents with   Follow-up    PFT:09/21/2022 Breathing is ok  Cough dry and mucus(yellow)    Referring provider: Marin Olp, MD  HPI: 72 year old female. PMH significant for hypertension, COPD, allergic rhinitis, GERD, hyperlipidemia, anemia, osteopenia.  Previous [facility] pulmonary encounter: 08/23/22- Consult, Katherine Foley  Chief Complaint  Patient presents with   Consult    SOB, cough and dyspnea x 2 weeks.  Dx COVID and strep throat 07/24/2022   Katherine Foley has COPD and is here to see me for the same > she had COVID not too long ago and she had increased chest congestion > she was told that she had fluid in her lungs on a recent chest x-ray > she had worsening dyspnea over the last few months > a year ago her symptoms were well controlled on spiriva and symbicort > No clear triggers why she started feeling short of breath prior to getting COVID, no environmental changes > had a heart cath this year that didn't  > She was given prednisone and molnupiravir when she had COVID November > at home she has a back up inhaler, no nebulizer  She has a hard time with Trelegy, often doesn't get it in.   Discussion: Katherine Foley returns to our clinic for the first time in several years.  She quit smoking in 2007 after an extensive smoking history and was left with centrilobular emphysema and at least moderate COPD based on lung function testing in 2016.  She has now been having recurrent episodes of COPD exacerbation since having COVID.  She also has significant upper airway wheezing which is variable and likely due to vocal cord dysfunction in the setting of significant stress at home.  We need to know why she is having recurrent exacerbations.  She needs to be treated for an exacerbation today.  She needs to have better rescue medicine at home.  She is not getting Trelegy into her  lung because she coughs frequently when taking it (dry powder inhaler) so we need to switch her back to an aerosol device.  If no improvement with treatment of a COPD exacerbation this week she needs to go to the hospital.  The finding of the pleural effusion in her left lung is actually chronic and related to the 2011 aortic valve surgery which was complicated by left-sided pleural effusion requiring multiple thoracenteses.  No change on recent imaging.  Plan: COPD with recurrent exacerbations: Stop Trelegy Start Breztri 2 sprays twice a day Will get your prescription for a nebulizer machine to use at home and DuoNeb to use every 6 hours as needed for shortness of breath Prednisone 20 mg daily x 5 days Doxycycline 100 mg twice daily x 5 days We need to figure out why this is happening: CBC with differential, IgE  Worsening shortness of breath: Lung function test High-resolution CT scan of the chest  Cough: Use Mucinex over-the-counter to help thin the mucus Tessalon 200 mg every 8 hours as needed cough  We will see you back in 2 weeks with a nurse practitioner to see how you are doing.  09/21/2022 Patient presents today follow-up/PFTs  New inhaler Katherine Foley has helped shortness of breath She has not required rescue inhaler, previously using 4 times a week Main issue is cough. She has coughing fits. Starts out dry cough then eventually becomes productive  Reflux controlled Sleeping alright at night  Tessalone perles do not help    Chest imaging: 2018 lung cancer screening CT scan personally reviewed showing centrilobular emphysema, some blunting of the left hemidiaphragm December 2023 two-view chest x-ray images independently reviewed showing hyperinflation of the right, elevation of the right hemidiaphragm and blunting of the costophrenic angle similar to studies from 2021.  Sternal wires, prosthetic aortic valve  PFT: 2016 pulmonary function testing ratio 57%, FEV1 1.28 L 58%  predicted, 9% change with bronchodilator, total lung capacity 5.24 L 110% predicted, residual volume 139% predicted, DLCO 15.67 72% predicted   Allergies  Allergen Reactions   Codeine Sulfate Itching and Rash   Hydrocodone-Acetaminophen Itching and Rash    Immunization History  Administered Date(s) Administered   Fluad Quad(high Dose 65+) 07/14/2020   Influenza Split 06/19/2011, 07/02/2012   Influenza Whole 08/03/2009   Influenza, High Dose Seasonal PF 05/20/2015, 06/04/2019, 06/04/2019   Influenza,inj,Quad PF,6+ Mos 05/21/2013, 05/14/2014, 06/17/2018   Influenza-Unspecified 08/17/2016, 06/19/2017, 06/17/2018   PFIZER(Purple Top)SARS-COV-2 Vaccination 10/20/2019, 11/09/2019, 06/30/2020   Pneumococcal Conjugate-13 06/14/2014   Pneumococcal Polysaccharide-23 03/22/2016   Td 08/29/2009, 03/17/2020   Zoster Recombinat (Shingrix) 03/10/2018, 07/15/2018   Zoster, Live 01/21/2016    Past Medical History:  Diagnosis Date   Adenomatous colon polyp    Anxiety    Anxiety/Depression 02/15/2009   Aortic stenosis s/p bioprosthetic AVR 07/07/2010   Echo 9/19:  Mild conc LVH, EF 60-65, no RWMA, Gr 1 DD, AVR ok (mean 10, peak 21), trivial MR, normal RVSF, mild TR // Echo 10/22: EF 60-65, no RWMA, normal RVSF, AVR with increased gradients (mean 24 increased from 10 in 2019) // Echocardiogram 10/23: EF 60-65, no RWMA, mildly reduced RVSF, AVR w mean gradient 17.3 mmHg   Barrett's esophagus    04/2007; 2000   Carotid stenosis 05/17/2010   a. dopplers 67/61: RICA 95-09%; LICA 3-26% (follow up due 11/12)   COPD 07/23/2007   Coronary Artery Disease 05/17/2010   NSTEMI 8/11 - BMS to RCA;  cath 4/12: dLAD 50%, RI 80% (PCI), AVCFX 30%, pRCA 30%, stent ok, 40-50, then 50% dist RCA;  c. s/p Promus DES to RI 12/25/10;  d. 04/2011 CABG x 1 (LIMA->LAD) @ time of AVR // Cath in 9/19: RI and RCA stents ok; L-LAD atretic, mild disease in mLAD >> med Rx   Family history of breast cancer    2 daughters; PALB2  mutation in family   Genetic testing 02/10/2018   PALB2 analysis at [facility] - Negative for familial mutation   GERD    H/O hiatal hernia    Hemorrhoid thrombosis    "had it lanced" (03/25/2013)   Herpes simplex without mention of complication 71/24/5809   History of blood transfusion    'w/OHS" (03/25/2013)   Hyperlipidemia 08/29/2009   Hypertension 04/14/2007   Migraine HAs 04/20/2008   Myocardial infarction [facility]) 2011   "during catherization" (03/25/2013)   OSTEOPENIA 04/14/2007   Ovarian cyst    Parotid mass    left   Pneumonia    "multiple times" (03/25/2013)   Urine, incontinence, stress female     Tobacco History: Social History   Tobacco Use  Smoking Status Former   Packs/day: 1.50   Years: 44.00   Total pack years: 66.00   Types: Cigarettes   Quit date: 09/03/2005   Years since quitting: 17.0  Smokeless Tobacco Never   Counseling given: Not Answered   Outpatient Medications Prior to Visit  Medication Sig Dispense Refill  acetaminophen (TYLENOL) 650 MG CR tablet Take 1,300 mg by mouth every 8 (eight) hours as needed for pain.     albuterol (VENTOLIN HFA) 108 (90 Base) MCG/ACT inhaler Inhale 2 puffs into the lungs every 6 (six) hours as needed for wheezing or shortness of breath. 8 g 3   amLODipine (NORVASC) 5 MG tablet TAKE ONE TABLET BY MOUTH ONCE DAILY 90 tablet 3   aspirin EC 81 MG tablet Take 1 tablet (81 mg total) by mouth daily.     Budeson-Glycopyrrol-Formoterol (BREZTRI AEROSPHERE) 160-9-4.8 MCG/ACT AERO Inhale 2 puffs into the lungs in the morning and at bedtime. 1 each 6   buPROPion (WELLBUTRIN XL) 300 MG 24 hr tablet TAKE ONE TABLET BY MOUTH ONCE DAILY 30 tablet 5   Calcium Carb-Cholecalciferol (CALCIUM 500/D PO) Take 2 tablets by mouth daily. Mag and zinc added     cetirizine (ZYRTEC) 10 MG tablet Take 10 mg by mouth at bedtime.     desvenlafaxine (PRISTIQ) 100 MG 24 hr tablet TAKE ONE TABLET BY MOUTH EVERY MORNING (Patient taking differently: Take 100  mg by mouth at bedtime.) 90 tablet 2   dexlansoprazole (DEXILANT) 60 MG capsule TAKE ONE CAPSULE BY MOUTH ONCE DAILY (Patient taking differently: Take 60 mg by mouth daily.) 90 capsule 3   ezetimibe (ZETIA) 10 MG tablet TAKE ONE TABLET BY MOUTH ONCE DAILY 90 tablet 1   feeding supplement (ENSURE ENLIVE / ENSURE PLUS) LIQD Take 237 mLs by mouth 2 (two) times daily between meals.     fluticasone (CUTIVATE) 0.05 % cream Apply 1 Application topically 2 (two) times daily as needed (eczema).     fluticasone (FLONASE) 50 MCG/ACT nasal spray Place 2 sprays into both nostrils as needed for allergies or rhinitis. 16 g 3   gabapentin (NEURONTIN) 300 MG capsule TAKE ONE CAPSULE BY MOUTH EVERYDAY AT BEDTIME (Patient taking differently: Take 300 mg by mouth at bedtime.) 90 capsule 3   ipratropium-albuterol (DUONEB) 0.5-2.5 (3) MG/3ML SOLN Take 3 mLs by nebulization every 6 (six) hours as needed. 360 mL 4   isosorbide mononitrate (IMDUR) 30 MG 24 hr tablet Take 0.5 tablets (15 mg total) by mouth daily. 45 tablet 3   LORazepam (ATIVAN) 0.5 MG tablet TAKE ONE TABLET BY MOUTH twice daily AS NEEDED FOR ANXIETY. Do not drive 8 hours after using. 60 tablet 1   Multiple Vitamins-Minerals (MULTIVITAMIN ADULT PO) Take 1 tablet by mouth daily.     nitroGLYCERIN (NITROSTAT) 0.4 MG SL tablet Place 1 tablet (0.4 mg total) under the tongue every 5 (five) minutes as needed for chest pain (if pain does not resolve with 2 doses, call 911). 30 tablet 5   rosuvastatin (CRESTOR) 40 MG tablet Take 1 tablet (40 mg total) by mouth daily. 90 tablet 2   valACYclovir (VALTREX) 1000 MG tablet Take 1,000 mg by mouth daily as needed (for fever blisters).     benzonatate (TESSALON) 200 MG capsule Take 1 capsule (200 mg total) by mouth 3 (three) times daily. (Patient not taking: Reported on 09/21/2022) 20 capsule 0   No facility-administered medications prior to visit.      Review of Systems  Review of Systems  Constitutional: Negative.    HENT: Negative.    Respiratory:  Positive for cough. Negative for shortness of breath.   Cardiovascular: Negative.      Physical Exam  BP 118/60 (BP Location: Left Arm, Patient Position: Sitting, Cuff Size: Normal)   Pulse 73   Ht '5\' 2"'$  (1.575  m)   Wt 128 lb (58.1 kg)   SpO2 94%   BMI 23.41 kg/m  Physical Exam Constitutional:      Appearance: Normal appearance.  HENT:     Head: Normocephalic and atraumatic.     Mouth/Throat:     Mouth: Mucous membranes are moist.     Pharynx: Oropharynx is clear.  Cardiovascular:     Rate and Rhythm: Normal rate and regular rhythm.  Pulmonary:     Effort: Pulmonary effort is normal.     Breath sounds: Normal breath sounds.  Musculoskeletal:        General: Normal range of motion.  Skin:    General: Skin is warm and dry.  Neurological:     General: No focal deficit present.     Mental Status: She is alert and oriented to person, place, and time. Mental status is at baseline.  Psychiatric:        Mood and Affect: Mood normal.        Behavior: Behavior normal.        Thought Content: Thought content normal.        Judgment: Judgment normal.      Lab Results:  CBC    Component Value Date/Time   WBC 9.9 08/28/2022 1445   RBC 3.94 08/28/2022 1445   HGB 12.3 08/28/2022 1445   HGB 11.9 07/31/2022 1143   HCT 37.1 08/28/2022 1445   HCT 36.6 07/31/2022 1143   PLT 240 08/28/2022 1445   PLT 223 07/31/2022 1143   MCV 94.2 08/28/2022 1445   MCV 95 07/31/2022 1143   MCH 31.2 08/28/2022 1445   MCHC 33.2 08/28/2022 1445   RDW 13.6 08/28/2022 1445   RDW 12.7 07/31/2022 1143   LYMPHSABS 0.7 08/28/2022 1445   MONOABS 0.4 08/28/2022 1445   EOSABS 0.1 08/28/2022 1445   BASOSABS 0.0 08/28/2022 1445    BMET    Component Value Date/Time   NA 137 08/29/2022 0404   NA 137 07/31/2022 1143   K 3.8 08/29/2022 0404   CL 103 08/29/2022 0404   CO2 28 08/29/2022 0404   GLUCOSE 143 (H) 08/29/2022 0404   BUN 9 08/29/2022 0404   BUN 11  07/31/2022 1143   CREATININE 0.58 08/29/2022 0404   CREATININE 0.70 07/04/2016 1200   CALCIUM 8.7 (L) 08/29/2022 0404   GFRNONAA >60 08/29/2022 0404   GFRAA 109 07/01/2019 1019    BNP    Component Value Date/Time   BNP 114.7 (H) 08/28/2022 1445    ProBNP    Component Value Date/Time   PROBNP 341 (H) 07/11/2022 0958   PROBNP 140.5 (H) 11/05/2013 1308    Imaging: CT Chest High Resolution  Result Date: 09/07/2022 CLINICAL DATA:  72 year old female with history of dyspnea on exertion. Prior history of aortic valve replacement. Evaluate for interstitial lung disease. EXAM: CT CHEST WITHOUT CONTRAST TECHNIQUE: Multidetector CT imaging of the chest was performed following the standard protocol without intravenous contrast. High resolution imaging of the lungs, as well as inspiratory and expiratory imaging, was performed. RADIATION DOSE REDUCTION: This exam was performed according to the departmental dose-optimization program which includes automated exposure control, adjustment of the mA and/or kV according to patient size and/or use of iterative reconstruction technique. COMPARISON:  Low-dose lung cancer screening chest CT 04/03/2017. FINDINGS: Cardiovascular: Heart size is normal. There is no significant pericardial fluid, thickening or pericardial calcification. There is aortic atherosclerosis, as well as atherosclerosis of the great vessels of the mediastinum and the coronary  arteries, including calcified atherosclerotic plaque in the left main, left anterior descending, left circumflex and right coronary arteries. Status post median sternotomy for CABG including LIMA to the LAD. Patient is also status post aortic valve replacement with what appears to be a stented bioprosthesis. 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 Mediastinum/Nodes: No pathologically enlarged mediastinal or hilar lymph nodes. Please note that accurate exclusion of hilar adenopathy is limited on noncontrast  CT scans. Esophagus is unremarkable in appearance. No axillary lymphadenopathy. Lungs/Pleura: Minimal linear scarring noted in the lung bases, likely sequela of remote infection or inflammation. High-resolution images demonstrate no generalized regions of ground-glass attenuation, septal thickening, subpleural reticulation, traction bronchiectasis or honeycombing to indicate interstitial lung disease. Inspiratory and expiratory imaging demonstrates partial collapse of the trachea during expiration, indicative of tracheomalacia. No acute consolidative airspace disease. No pleural effusions. No definite suspicious appearing pulmonary nodules or masses are noted. Upper Abdomen: Aortic atherosclerosis. Musculoskeletal: Median sternotomy wires. There are no aggressive appearing lytic or blastic lesions noted in the visualized portions of the skeleton. IMPRESSION: 1. No findings to suggest interstitial lung disease. 2. Mild tracheomalacia. 3. Aortic atherosclerosis, in addition to left main and three-vessel coronary artery disease. Status post median sternotomy for CABG including LIMA to the LAD. 4. 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 Recommend annual imaging followup by CTA or MRA. This recommendation follows 2010 ACCF/AHA/AATS/ACR/ASA/SCA/SCAI/SIR/STS/SVM Guidelines for the Diagnosis and Management of Patients with Thoracic Aortic Disease. Circulation. 2010; 121: I502-D741. Aortic aneurysm NOS (ICD10-I71.9) Aortic Atherosclerosis (ICD10-I70.0). Electronically Signed   By: Vinnie Langton M.D.   On: 09/07/2022 08:08   DG CHEST PORT 1 VIEW  Result Date: 09/01/2022 CLINICAL DATA:  Acute respiratory failure with hypoxia. EXAM: PORTABLE CHEST 1 VIEW COMPARISON:  08/28/2022 FINDINGS: Status post median sternotomy. Heart size is normal. There is minimal LEFT basilar pleural thickening or small pleural effusion, showing long-term stability. Lungs are clear otherwise. No edema. IMPRESSION: Minimal  LEFT basilar pleural thickening or small pleural effusion. Electronically Signed   By: Nolon Nations M.D.   On: 09/01/2022 07:17   DG Chest 2 View  Result Date: 08/28/2022 CLINICAL DATA:  Cough and shortness of breath for several weeks. EXAM: CHEST - 2 VIEW COMPARISON:  Chest x-ray dated August 20, 2022. FINDINGS: Normal heart size status post CABG and AVR. Normal mediastinal contours. Normal pulmonary vascularity. No focal consolidation, pleural effusion, or pneumothorax. Chronic scarring at the left costophrenic angle. No acute osseous abnormality. IMPRESSION: No active cardiopulmonary disease. Electronically Signed   By: Titus Dubin M.D.   On: 08/28/2022 15:29     Assessment & Plan:   COPD (chronic obstructive pulmonary disease) ([place]) SOB is better with Breztri. Main issue is cough PFTs with severe obstruction, appears stable from previous testing in 2016   Allergic rhinitis PND likely contributing to cough Adding Atrovent nasal spray      Martyn Ehrich, NP 09/24/2022

## 2022-09-21 NOTE — Patient Instructions (Addendum)
Pulmonary function testing today continues to show known obstructive lung disease, stable compared to last breathing test in 2016  Imaging of your lungs showed Minimal linear scarring. No evidence of fibrosis/interstitial lung disease. Mild tracheomalacia   Recommendations Continue Breztri Aerosphere 2 puffs morning at bedtime Continue Zyrtec 10 mg daily Continue Flonase nasal spray as directed Add Atrovent (ipratropium) nasal spray twice daily for PND Continue reflux medication   Follow-up: 3 months with Katherine Foley

## 2022-09-24 ENCOUNTER — Ambulatory Visit: Payer: Medicare HMO | Admitting: Pharmacist

## 2022-09-24 NOTE — Assessment & Plan Note (Signed)
PND likely contributing to cough Adding Atrovent nasal spray

## 2022-09-24 NOTE — Assessment & Plan Note (Signed)
SOB is better with Breztri. Main issue is cough PFTs with severe obstruction, appears stable from previous testing in 2016

## 2022-09-25 ENCOUNTER — Telehealth: Payer: Self-pay | Admitting: *Deleted

## 2022-09-25 NOTE — Progress Notes (Signed)
  Care Coordination  Outreach Note  09/25/2022 Name: Katherine Foley MRN: [id number] DOB: May 26, 1951   Care Coordination Outreach Attempts: An unsuccessful telephone outreach was attempted today to offer the patient information about available care coordination services as a benefit of their health plan.   Follow Up Plan:  Additional outreach attempts will be made to offer the patient care coordination information and services.   Encounter Outcome:  No Answer  [place]  Direct Dial: [phone]

## 2022-09-25 NOTE — Telephone Encounter (Signed)
   Telephone encounter was:  Unsuccessful.  09/25/2022 Name: Katherine Foley MRN: [id number] DOB: 08/18/1951  Unsuccessful outbound call made today to assist with:  [place] reached out about patient not having a place to live soon , had previously followed up with patient and she had refused the options I could provide as she had already reached out to apartments in the area she wishes to stay , wont be a roomate again. Left message for her to return the call to see if I have any other resources for her     A HIPAA compliant voice message was left requesting a return call.  Instructed patient to call back at [phone].  [place] [phone] [address] , [place] [address] Email : [email or web address]

## 2022-09-25 NOTE — Progress Notes (Signed)
  Care Coordination   Note   09/25/2022 Name: Katherine Foley MRN: [id number] DOB: 10-15-1950  Katherine Foley is a 72 y.o. year old female who sees Yong Channel, Brayton Mars, MD for primary care. I reached out to [name] by phone today to offer care coordination services.  [name] was given information about Care Coordination services today including:   The Care Coordination services include support from the care team which includes your Nurse Coordinator, Clinical Social Worker, or Pharmacist.  The Care Coordination team is here to help remove barriers to the health concerns and goals most important to you. Care Coordination services are voluntary, and the patient may decline or stop services at any time by request to their care team member.   Care Coordination Consent Status: Patient agreed to services and verbal consent obtained.   Follow up plan:  Telephone appointment with care coordination team member scheduled for:  10/03/22  Encounter Outcome:  Pt. Scheduled  [place]  Direct Dial: [phone]

## 2022-09-26 ENCOUNTER — Encounter: Payer: Self-pay | Admitting: Family Medicine

## 2022-09-26 ENCOUNTER — Ambulatory Visit (INDEPENDENT_AMBULATORY_CARE_PROVIDER_SITE_OTHER): Payer: Medicare HMO | Admitting: Family Medicine

## 2022-09-26 VITALS — BP 120/70 | HR 77 | Temp 97.0°F | Ht 62.0 in | Wt 126.8 lb

## 2022-09-26 DIAGNOSIS — R040 Epistaxis: Secondary | ICD-10-CM | POA: Diagnosis not present

## 2022-09-26 DIAGNOSIS — I700 Atherosclerosis of aorta: Secondary | ICD-10-CM | POA: Diagnosis not present

## 2022-09-26 DIAGNOSIS — I25119 Atherosclerotic heart disease of native coronary artery with unspecified angina pectoris: Secondary | ICD-10-CM

## 2022-09-26 DIAGNOSIS — E785 Hyperlipidemia, unspecified: Secondary | ICD-10-CM

## 2022-09-26 DIAGNOSIS — I10 Essential (primary) hypertension: Secondary | ICD-10-CM | POA: Diagnosis not present

## 2022-09-26 NOTE — Progress Notes (Signed)
Phone [phone] In person visit   Subjective:   Katherine Foley is a 72 y.o. year old very pleasant female patient who presents for/with See problem oriented charting Chief Complaint  Patient presents with   nose bleeds    Pt c/o bad nose bleeds that started Saturday morning after she got up that's constant.   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    Priority: High   Depression 09/22/2015    Priority: High   Former smoker 09/22/2015    Priority: High   S/P AVR (aortic valve replacement) 11/06/2013    Priority: High   Aortic stenosis 07/07/2010    Priority: High   Coronary artery disease involving native coronary artery with angina pectoris ([place]) 05/17/2010    Priority: High   COPD (chronic obstructive pulmonary disease) ([place]) 07/23/2007    Priority: High   Aortic atherosclerosis ([place]) 03/10/2020    Priority: Medium    Restless legs 08/25/2019    Priority: Medium    Hot flashes 03/22/2016    Priority: Medium    Diastolic dysfunction 19/50/9326    Priority: Medium    GERD (gastroesophageal reflux disease) 06/04/2014    Priority: Medium    Carotid stenosis 05/17/2010    Priority: Medium    HLD (hyperlipidemia) 08/29/2009    Priority: Medium    Essential hypertension 04/14/2007    Priority: Medium    Osteopenia 04/14/2007    Priority: Medium    Senile purpura ([place]) 03/05/2022    Priority: Low   Genetic testing 02/10/2018    Priority: Low   Family history of breast cancer     Priority: Low   Allergic rhinitis 09/22/2015    Priority: Low   Adenomatous colon polyp     Priority: Low   Herpes simplex virus (HSV) infection 01/16/2010    Priority: Low   Recurrent major depressive disorder, in partial remission ([place]) 09/06/2022   Influenza A 08/28/2022   COVID-19 07/31/2022   Hypokalemia 07/31/2022   Anemia 07/31/2022   Coronary artery  disease involving native coronary artery of native heart without angina pectoris 07/23/2022   Precordial chest pain 11/06/2013    Medications- reviewed and updated Current Outpatient Medications  Medication Sig Dispense Refill   acetaminophen (TYLENOL) 650 MG CR tablet Take 1,300 mg by mouth every 8 (eight) hours as needed for pain.     albuterol (VENTOLIN HFA) 108 (90 Base) MCG/ACT inhaler Inhale 2 puffs into the lungs every 6 (six) hours as needed for wheezing or shortness of breath. 8 g 3   amLODipine (NORVASC) 5 MG tablet TAKE ONE TABLET BY MOUTH ONCE DAILY 90 tablet 3   aspirin EC 81 MG tablet Take 1 tablet (81 mg total) by mouth daily.     Budeson-Glycopyrrol-Formoterol (BREZTRI AEROSPHERE) 160-9-4.8 MCG/ACT AERO Inhale 2 puffs into the lungs in the morning and at bedtime. 1 each 6   buPROPion (WELLBUTRIN XL) 300 MG 24 hr tablet TAKE ONE TABLET BY MOUTH ONCE DAILY 30 tablet 5   Calcium Carb-Cholecalciferol (CALCIUM 500/D PO) Take 2 tablets by mouth daily. Mag and zinc added     cetirizine (ZYRTEC) 10 MG tablet Take 10 mg by mouth at bedtime.     desvenlafaxine (PRISTIQ) 100 MG 24 hr tablet TAKE ONE TABLET BY MOUTH EVERY MORNING (Patient taking differently: Take 100 mg by mouth at bedtime.) 90 tablet  2   dexlansoprazole (DEXILANT) 60 MG capsule TAKE ONE CAPSULE BY MOUTH ONCE DAILY (Patient taking differently: Take 60 mg by mouth daily.) 90 capsule 3   ezetimibe (ZETIA) 10 MG tablet TAKE ONE TABLET BY MOUTH ONCE DAILY 90 tablet 1   feeding supplement (ENSURE ENLIVE / ENSURE PLUS) LIQD Take 237 mLs by mouth 2 (two) times daily between meals.     fluticasone (CUTIVATE) 0.05 % cream Apply 1 Application topically 2 (two) times daily as needed (eczema).     fluticasone (FLONASE) 50 MCG/ACT nasal spray Place 2 sprays into both nostrils as needed for allergies or rhinitis. 16 g 3   gabapentin (NEURONTIN) 300 MG capsule TAKE ONE CAPSULE BY MOUTH EVERYDAY AT BEDTIME (Patient taking differently: Take  300 mg by mouth at bedtime.) 90 capsule 3   ipratropium (ATROVENT) 0.03 % nasal spray Place 2 sprays into both nostrils every 12 (twelve) hours. 30 mL 2   ipratropium-albuterol (DUONEB) 0.5-2.5 (3) MG/3ML SOLN Take 3 mLs by nebulization every 6 (six) hours as needed. 360 mL 4   isosorbide mononitrate (IMDUR) 30 MG 24 hr tablet Take 0.5 tablets (15 mg total) by mouth daily. 45 tablet 3   LORazepam (ATIVAN) 0.5 MG tablet TAKE ONE TABLET BY MOUTH twice daily AS NEEDED FOR ANXIETY. Do not drive 8 hours after using. 60 tablet 1   Multiple Vitamins-Minerals (MULTIVITAMIN ADULT PO) Take 1 tablet by mouth daily.     nitroGLYCERIN (NITROSTAT) 0.4 MG SL tablet Place 1 tablet (0.4 mg total) under the tongue every 5 (five) minutes as needed for chest pain (if pain does not resolve with 2 doses, call 911). 30 tablet 5   rosuvastatin (CRESTOR) 40 MG tablet Take 1 tablet (40 mg total) by mouth daily. 90 tablet 2   valACYclovir (VALTREX) 1000 MG tablet Take 1,000 mg by mouth daily as needed (for fever blisters).     benzonatate (TESSALON) 200 MG capsule Take 1 capsule (200 mg total) by mouth 3 (three) times daily. (Patient not taking: Reported on 09/26/2022) 20 capsule 0   No current facility-administered medications for this visit.     Objective:  BP 120/70   Pulse 77   Temp (!) 97 F (36.1 C)   Ht '5\' 2"'$  (1.575 m)   Wt 126 lb 12.8 oz (57.5 kg)   SpO2 95%   BMI 23.19 kg/m  Gen: NAD, resting comfortably Tympanic membranes normal bilaterally.  Pharynx with some signs of drainage.  Left nasal turbinate normal, right nasal turbinate with no active bleeding but do note residual still red blood in her nostril that appears relatively fresh-used a Q-tip to remove some of this but still appears to have some deeper in the nostril-do not see evidence/source of bleeding CV: RRR baseline murmur Lungs: CTAB no crackles, wheeze, rhonchi Ext: no edema Skin: warm, dry     Assessment and Plan   #  Nosebleeds S:started on Saturday morning when she woke up.  Pinched bridge of nose- and took 35-45 minutes to calm down. Recurred the following morning- took less time to stop it that time. Then bled again Monday morning and night, then last night and this morning- was able to stop in under 10 minutes each of these occasionas.  -patient is on aspirin 81 mg for CAD -flonase is on her list and she was using until this started but has stopped -has not blown her nose with this.  -never had nosebleeds - keeps house at [address]- heat has been on  obviously with how cold its been -has cool mist humidifier  -being sent atrovent by pulmonology- in mail today A/P: Epistaxis that started while patient was on Flonase but fortunately she has discontinued this and agrees to remain off for now.  I mentioned that the Atrovent that was sent in also carries some risk of nosebleeds about 6-9% per up-to-date so I do not strongly recommend that.  She has seen Dr. Benjamine Mola before with ENT-refer back to him From AVS "  Patient Instructions  Remain off flonase -I think the atrovent that they are sending could cause nosebleeds as well -I would lean more towards holding for 5-15 minute an dif doesn't resolve use afrin nasal spray 2 sprays in right nostril (does have some risk to your heart and blood pressure but we also need to stop the bleeding!)- if you feel off with afrin do not reuse -if can't stop bleeding after a 30 minute hold beyond that seek care- may need packing or other treatments -I would restart cool mist humidifier overnight and perhaps an hour before bed even start it  We will call you within two weeks about your referral to Dr. Benjamine Mola. If you do not hear within 2 weeks, give [place] a call.   Can hold aspirin for 5 days as well  Recommended follow up: Return for as needed for new, worsening, persistent symptoms. "  #CAD-Status post prior PCI to the RCA and RI and subsequent CABG in 2012.  Cardiac catheterization in  2019 demonstrated patent stents in the RCA and RI, atretic LIMA-LAD and mild nonobstructive disease in the LAD.  -Also had cath on 07/23/2022-plan was medical management #hyperlipidemia with aortic atherosclerosis S: Medication:Aspirin 81 mg, Zetia 10 mg, rosuvastatin 40 mg, Imdur 30 mg -No chest pain reported.  Ongoing shortness of breath with COPD Lab Results  Component Value Date   CHOL 112 03/05/2022   HDL 51.20 03/05/2022   LDLCALC 44 03/05/2022   LDLDIRECT 65.0 06/19/2017   TRIG 86.0 03/05/2022   CHOLHDL 2 03/05/2022   A/P: CAD-appears stable-I do think very short-term.  Off of aspirin will be reasonable and unfortunately even though it can raise blood pressure and increase cardiac risk I think short-term Afrin if needed would be helpful-see above Hyperlipidemia-lipids very well-controlled-continue current medication Aortic atherosclerosis (presumed stable)- LDL goal ideally <70-at goal-continue current medication  #hypertension S: medication: Amlodipine 5 mg, Imdur 30 mg BP Readings from Last 3 Encounters:  09/26/22 120/70  09/21/22 118/60  09/06/22 124/60   A/P: Blood pressure is well-controlled-I think she can tolerate a single dose of Afrin if needed per day if recurrence of nosebleeds  Recommended follow up: Return for as needed for new, worsening, persistent symptoms. Future Appointments  Date Time Provider [place]  10/03/2022 11:15 AM Daneen Schick [facility] None  12/14/2022  2:00 PM Marin Olp, MD [facility]  01/25/2023  9:20 AM Sherren Mocha, MD [facility]  06/10/2023  8:30 AM [facility] HEALTH COACH [facility]    Lab/Order associations:   ICD-10-CM   1. Epistaxis  R04.0 Ambulatory referral to ENT    2. Coronary artery disease involving native coronary artery of native heart with angina pectoris ([place])  I25.119     3. Aortic atherosclerosis (H[facility])  I70.0     4. Essential hypertension  I10     5. Hyperlipidemia, unspecified  hyperlipidemia type  E78.5      Return precautions advised.  Garret Reddish, MD

## 2022-09-26 NOTE — Patient Instructions (Addendum)
Remain off flonase -I think the atrovent that they are sending could cause nosebleeds as well -I would lean more towards holding for 5-15 minute an dif doesn't resolve use afrin nasal spray 2 sprays in right nostril (does have some risk to your heart and blood pressure but we also need to stop the bleeding!)- if you feel off with afrin do not reuse -if can't stop bleeding after a 30 minute hold beyond that seek care- may need packing or other treatments -I would restart cool mist humidifier overnight and perhaps an hour before bed even start it  We will call you within two weeks about your referral to Dr. Benjamine Mola. If you do not hear within 2 weeks, give [place] a call.   Can hold aspirin for 5 days as well  Recommended follow up: Return for as needed for new, worsening, persistent symptoms.

## 2022-10-03 ENCOUNTER — Ambulatory Visit: Payer: Self-pay

## 2022-10-03 ENCOUNTER — Telehealth: Payer: Self-pay

## 2022-10-03 NOTE — Patient Instructions (Signed)
Visit Information  Thank you for taking time to visit with me today. Please don't hesitate to contact me if I can be of assistance to you.   Following are the goals we discussed today:   Goals Addressed             This Visit's Progress    COMPLETED: Care Coordination Activities       Care Coordination Interventions: Referral received to assist patient with housing resources Determined the patient is currently living at a friends house and will have to move out "by next weekend" as her friend plans to put the home on the market. Patient states "I can't be on her couch when the home is on the market and being shown" Reviewed challenges with finding housing in less that one week that will be within patients budget. Patient does report she is working part-time to supplement income and does have some funds in savings. Patient would not disclose savings amount Performed chart review to note patient has been outreached in the past by community resource care guides to assist with housing resource needs Patient reports she has applied to several apartment complexes and is on waiting lists. Patient did locate a one bedroom at [facility]" but reports she felt it was too small for all her belongings in storage.  Assessed for patient ability to downsize belongings in order to move into a one bedroom apartment and move items out of storage. Patient reports she is not willing to downsize Discussed opportunity to look into renting a room, extended stay hotels, and possibly a shelter considering the patient has a limited timeframe to move. Patient responded by stating "I guess I will go buy a tent" Patient refuses resources this SW is able to provide indicating she does not want to rent a room due to having a dog and is not interested in an extended stay hotel as she would not be able to utilize items in storage Extensive education on what living in a shelter would look like including having to leave the  shelter during the day, possible limitations of allowing her dog into the building, living with a community of other individuals, and inability to leave belongings while not in the shelter. Patient remains adamant she is not interested in other housing options Patient reports she has money in savings but would not disclose amount. Patient declines resource assistance Encouraged the patient to contact SW should she change her mind      Patient Stated          If you are experiencing a Mental Health or [place] or need someone to talk to, please call 911  Patient verbalizes understanding of instructions and care plan provided today and agrees to view in [place]. Active MyChart status and patient understanding of how to access instructions and care plan via MyChart confirmed with patient.     No further follow up required: Please contact your primary care provider as needed.  Daneen Schick, BSW, CDP Social Worker, Certified Dementia Practitioner [facility]  Care Coordination [phone]

## 2022-10-03 NOTE — Patient Outreach (Signed)
  Care Coordination   10/03/2022 Name: Katherine Foley MRN: [id number] DOB: 1950/12/23   Care Coordination Outreach Attempts:  An unsuccessful telephone outreach was attempted for a scheduled appointment today.  Follow Up Plan:  Additional outreach attempts will be made to offer the patient care coordination information and services.   Encounter Outcome:  No Answer   Care Coordination Interventions:  No, not indicated    Daneen Schick, BSW, CDP Social Worker, Certified Dementia Practitioner [name]  Care Coordination [phone]

## 2022-10-03 NOTE — Patient Outreach (Signed)
  Care Coordination   Follow Up Visit Note   10/03/2022 Name: Katherine Foley MRN: [id number] DOB: February 25, 1951  Katherine Foley is a 72 y.o. year old female who sees Yong Channel, Brayton Mars, MD for primary care. I spoke with  Katherine Foley by phone today.  What matters to the patients health and wellness today?  I need to move by next weekend    Goals Addressed             This Visit's Progress    COMPLETED: Care Coordination Activities       Care Coordination Interventions: Referral received to assist patient with housing resources Determined the patient is currently living at a friends house and will have to move out "by next weekend" as her friend plans to put the home on the market. Patient states "I can't be on her couch when the home is on the market and being shown" Reviewed challenges with finding housing in less that one week that will be within patients budget. Patient does report she is working part-time to supplement income and does have some funds in savings. Patient would not disclose savings amount Performed chart review to note patient has been outreached in the past by community resource care guides to assist with housing resource needs Patient reports she has applied to several apartment complexes and is on waiting lists. Patient did locate a one bedroom at [facility] [employer]" but reports she felt it was too small for all her belongings in storage.  Assessed for patient ability to downsize belongings in order to move into a one bedroom apartment and move items out of storage. Patient reports she is not willing to downsize Discussed opportunity to look into renting a room, extended stay hotels, and possibly a shelter considering the patient has a limited timeframe to move. Patient responded by stating "I guess I will go buy a tent" Patient refuses resources this SW is able to provide indicating she does not want to rent a room due to having a dog and is not interested in  an extended stay hotel as she would not be able to utilize items in storage Extensive education on what living in a shelter would look like including having to leave the shelter during the day, possible limitations of allowing her dog into the building, living with a community of other individuals, and inability to leave belongings while not in the shelter. Patient remains adamant she is not interested in other housing options Patient reports she has money in savings but would not disclose amount. Patient declines resource assistance Encouraged the patient to contact SW should she change her mind      Patient Stated          SDOH assessments and interventions completed:  Yes  SDOH Interventions Today    Flowsheet Row Most Recent Value  SDOH Interventions   Housing Interventions Patient Refused        Care Coordination Interventions:  Yes, provided resource education. Patient refused further intervention.   Follow up plan: No further intervention required.   Encounter Outcome:  Pt. Visit Completed   Daneen Schick, BSW, CDP Social Worker, Certified Dementia Practitioner [facility]  Care Coordination [phone]

## 2022-10-08 ENCOUNTER — Telehealth: Payer: Self-pay | Admitting: Pharmacist

## 2022-10-08 NOTE — Progress Notes (Unsigned)
Care Management & Coordination Services Pharmacy Team  Reason for Encounter: Medication coordination and delivery  Contacted patient to discuss medications and coordinate delivery from [facility]. {US HC Outreach:28874} Cycle dispensing form sent to *** for review.   Last adherence delivery date: 08/20/2022      Patient is due for next adherence delivery on: 10/18/2022  This delivery to include: Vials  30 Days  Gabapentin 300 mg Take one capsule by mouth everyday at bedtime Ezetimibe 10 mg Tab Take one tab by mouth once daily Dexlansoprazole 60 mg Cap Take one capsule once daily Desvenlafaxine 100 mg-Take one tab by mouth once daily Amlodipine 5 mg once daily Propranolol 20 mg tab Take one tab by mouth twice daily  Rosuvastatin 40 mg- Take one tab by mouth once daily Bupropion HCL XL 300 mg - once daily Lorazepam 0.5 mg twice daily as needed Isosorbide 30 mg 1/2 tablet once daily  Patient declined the following medications this month: ***  Refills requested from providers include: Bupropion HCL XL 300 mg - once daily  {Delivery date:25786}   Any concerns about your medications? {yes/no:20286}  How often do you forget or accidentally miss a dose? {Missed doses:25554}  Do you use a pillbox? {yes/no:20286}  Is patient in packaging {yes/no:20286}  If yes  What is the date on your next pill pack?  Any concerns or issues with your packaging?   Recent blood pressure readings are as follows:***  Recent blood glucose readings are as follows:***   Chart review: Recent office visits:  09/26/2022 [facility] (PCP) Marin Olp, MD; Remain off flonase, -I think the atrovent that they are sending could cause nosebleeds as well    Recent consult visits:  None  Hospital visits:  None in previous 6 months  Medications: Outpatient Encounter Medications as of 10/08/2022  Medication Sig   acetaminophen (TYLENOL) 650 MG CR tablet Take 1,300 mg by mouth every 8 (eight) hours  as needed for pain.   albuterol (VENTOLIN HFA) 108 (90 Base) MCG/ACT inhaler Inhale 2 puffs into the lungs every 6 (six) hours as needed for wheezing or shortness of breath.   amLODipine (NORVASC) 5 MG tablet TAKE ONE TABLET BY MOUTH ONCE DAILY   aspirin EC 81 MG tablet Take 1 tablet (81 mg total) by mouth daily.   benzonatate (TESSALON) 200 MG capsule Take 1 capsule (200 mg total) by mouth 3 (three) times daily. (Patient not taking: Reported on 09/26/2022)   Budeson-Glycopyrrol-Formoterol (BREZTRI AEROSPHERE) 160-9-4.8 MCG/ACT AERO Inhale 2 puffs into the lungs in the morning and at bedtime.   buPROPion (WELLBUTRIN XL) 300 MG 24 hr tablet TAKE ONE TABLET BY MOUTH ONCE DAILY   Calcium Carb-Cholecalciferol (CALCIUM 500/D PO) Take 2 tablets by mouth daily. Mag and zinc added   cetirizine (ZYRTEC) 10 MG tablet Take 10 mg by mouth at bedtime.   desvenlafaxine (PRISTIQ) 100 MG 24 hr tablet TAKE ONE TABLET BY MOUTH EVERY MORNING (Patient taking differently: Take 100 mg by mouth at bedtime.)   dexlansoprazole (DEXILANT) 60 MG capsule TAKE ONE CAPSULE BY MOUTH ONCE DAILY (Patient taking differently: Take 60 mg by mouth daily.)   ezetimibe (ZETIA) 10 MG tablet TAKE ONE TABLET BY MOUTH ONCE DAILY   feeding supplement (ENSURE ENLIVE / ENSURE PLUS) LIQD Take 237 mLs by mouth 2 (two) times daily between meals.   fluticasone (CUTIVATE) 0.05 % cream Apply 1 Application topically 2 (two) times daily as needed (eczema).   fluticasone (FLONASE) 50 MCG/ACT nasal spray Place 2  sprays into both nostrils as needed for allergies or rhinitis.   gabapentin (NEURONTIN) 300 MG capsule TAKE ONE CAPSULE BY MOUTH EVERYDAY AT BEDTIME (Patient taking differently: Take 300 mg by mouth at bedtime.)   ipratropium (ATROVENT) 0.03 % nasal spray Place 2 sprays into both nostrils every 12 (twelve) hours.   ipratropium-albuterol (DUONEB) 0.5-2.5 (3) MG/3ML SOLN Take 3 mLs by nebulization every 6 (six) hours as needed.   isosorbide  mononitrate (IMDUR) 30 MG 24 hr tablet Take 0.5 tablets (15 mg total) by mouth daily.   LORazepam (ATIVAN) 0.5 MG tablet TAKE ONE TABLET BY MOUTH twice daily AS NEEDED FOR ANXIETY. Do not drive 8 hours after using.   Multiple Vitamins-Minerals (MULTIVITAMIN ADULT PO) Take 1 tablet by mouth daily.   nitroGLYCERIN (NITROSTAT) 0.4 MG SL tablet Place 1 tablet (0.4 mg total) under the tongue every 5 (five) minutes as needed for chest pain (if pain does not resolve with 2 doses, call 911).   rosuvastatin (CRESTOR) 40 MG tablet Take 1 tablet (40 mg total) by mouth daily.   valACYclovir (VALTREX) 1000 MG tablet Take 1,000 mg by mouth daily as needed (for fever blisters).   [DISCONTINUED] Aclidinium Bromide (TUDORZA PRESSAIR) 400 MCG/ACT AEPB Inhale 1 puff into the lungs 2 (two) times daily.   No facility-administered encounter medications on file as of 10/08/2022.   BP Readings from Last 3 Encounters:  09/26/22 120/70  09/21/22 118/60  09/06/22 124/60    Pulse Readings from Last 3 Encounters:  09/26/22 77  09/21/22 73  09/06/22 77    Lab Results  Component Value Date/Time   HGBA1C 6.2 (H) 04/27/2011 11:59 AM   Lab Results  Component Value Date   CREATININE 0.58 08/29/2022   BUN 9 08/29/2022   GFR 84.66 03/05/2022   GFRNONAA >60 08/29/2022   GFRAA 109 07/01/2019   NA 137 08/29/2022   K 3.8 08/29/2022   CALCIUM 8.7 (L) 08/29/2022   CO2 28 08/29/2022    Future Appointments  Date Time Provider [place]  12/14/2022  2:00 PM Marin Olp, MD [facility]  01/25/2023  9:20 AM Sherren Mocha, MD [facility]  06/10/2023  8:30 AM [facility] HEALTH COACH [facility]   [name], [place] Pharmacist Assistant [phone]

## 2022-10-10 ENCOUNTER — Telehealth: Payer: Self-pay

## 2022-10-10 MED ORDER — BUPROPION HCL ER (XL) 300 MG PO TB24: 300.0000 mg | ORAL_TABLET | Freq: Every day | ORAL | 3 refills | Status: DC

## 2022-10-10 NOTE — Telephone Encounter (Signed)
-----   Message from [name] sent at 10/09/2022  4:38 PM EST ----- Regarding: Rx refill request Patient has an upcoming delivery with [facility]. She will need refills on Bupropion 300 mg daily to complete her order. Please send refills to [facility].  Thank you,  [name], [place] Pharmacist Assistant [phone]

## 2022-10-30 ENCOUNTER — Telehealth: Payer: Self-pay | Admitting: Pharmacist

## 2022-10-30 NOTE — Progress Notes (Cosign Needed)
Patient reports new address:  [address]  [place], [address]  [facility] has been updated with patients new address. Patient states she has moved into an apartment. She is adjusting well.  [name], [place] Pharmacist Assistant [phone]

## 2022-11-07 ENCOUNTER — Telehealth: Payer: Self-pay | Admitting: Pharmacist

## 2022-11-07 NOTE — Progress Notes (Signed)
Care Management & Coordination Services Pharmacy Team  Reason for Encounter: Medication coordination and delivery  Contacted patient to discuss medications and coordinate delivery from [facility]. {US HC Outreach:28874} Cycle dispensing form sent to *** for review.   Last adherence delivery date: 08/20/2022      Patient is due for next adherence delivery on: 11/19/2022  This delivery to include: Vials  30 Days  Gabapentin 300 mg Take one capsule by mouth everyday at bedtime Ezetimibe 10 mg Tab Take one tab by mouth once daily Dexlansoprazole 60 mg Cap Take one capsule once daily Desvenlafaxine 100 mg-Take one tab by mouth once daily Amlodipine 5 mg once daily Rosuvastatin 40 mg- Take one tab by mouth once daily Bupropion HCL XL 300 mg - once daily Lorazepam 0.5 mg twice daily as needed Isosorbide 30 mg 1/2 tablet once daily Beztri inhaler  Patient declined the following medications this month: ***  Refills requested from providers include: Desvenlafaxine 100 mg-Take one tab by mouth once daily Gabapentin 300 mg Take one capsule by mouth everyday at bedtime Lorazepam 0.5 mg twice daily as needed  {Delivery date:25786}   Any concerns about your medications? {yes/no:20286}  How often do you forget or accidentally miss a dose? {Missed doses:25554}  Do you use a pillbox? {yes/no:20286}  Is patient in packaging {yes/no:20286}  If yes  What is the date on your next pill pack?  Any concerns or issues with your packaging?   Recent blood pressure readings are as follows:***  Recent blood glucose readings are as follows:***   Chart review: Recent office visits:  None since last medication coordination call  Recent consult visits:  None since last medication coordination call  Hospital visits:  None since last medication coordination call  Medications: Outpatient Encounter Medications as of 11/07/2022  Medication Sig   acetaminophen (TYLENOL) 650 MG CR tablet  Take 1,300 mg by mouth every 8 (eight) hours as needed for pain.   albuterol (VENTOLIN HFA) 108 (90 Base) MCG/ACT inhaler Inhale 2 puffs into the lungs every 6 (six) hours as needed for wheezing or shortness of breath.   amLODipine (NORVASC) 5 MG tablet TAKE ONE TABLET BY MOUTH ONCE DAILY   aspirin EC 81 MG tablet Take 1 tablet (81 mg total) by mouth daily.   benzonatate (TESSALON) 200 MG capsule Take 1 capsule (200 mg total) by mouth 3 (three) times daily. (Patient not taking: Reported on 09/26/2022)   Budeson-Glycopyrrol-Formoterol (BREZTRI AEROSPHERE) 160-9-4.8 MCG/ACT AERO Inhale 2 puffs into the lungs in the morning and at bedtime.   buPROPion (WELLBUTRIN XL) 300 MG 24 hr tablet Take 1 tablet (300 mg total) by mouth daily.   Calcium Carb-Cholecalciferol (CALCIUM 500/D PO) Take 2 tablets by mouth daily. Mag and zinc added   cetirizine (ZYRTEC) 10 MG tablet Take 10 mg by mouth at bedtime.   desvenlafaxine (PRISTIQ) 100 MG 24 hr tablet TAKE ONE TABLET BY MOUTH EVERY MORNING (Patient taking differently: Take 100 mg by mouth at bedtime.)   dexlansoprazole (DEXILANT) 60 MG capsule TAKE ONE CAPSULE BY MOUTH ONCE DAILY (Patient taking differently: Take 60 mg by mouth daily.)   ezetimibe (ZETIA) 10 MG tablet TAKE ONE TABLET BY MOUTH ONCE DAILY   feeding supplement (ENSURE ENLIVE / ENSURE PLUS) LIQD Take 237 mLs by mouth 2 (two) times daily between meals.   fluticasone (CUTIVATE) 0.05 % cream Apply 1 Application topically 2 (two) times daily as needed (eczema).   fluticasone (FLONASE) 50 MCG/ACT nasal spray Place 2 sprays into  both nostrils as needed for allergies or rhinitis.   gabapentin (NEURONTIN) 300 MG capsule TAKE ONE CAPSULE BY MOUTH EVERYDAY AT BEDTIME (Patient taking differently: Take 300 mg by mouth at bedtime.)   ipratropium (ATROVENT) 0.03 % nasal spray Place 2 sprays into both nostrils every 12 (twelve) hours.   ipratropium-albuterol (DUONEB) 0.5-2.5 (3) MG/3ML SOLN Take 3 mLs by  nebulization every 6 (six) hours as needed.   isosorbide mononitrate (IMDUR) 30 MG 24 hr tablet Take 0.5 tablets (15 mg total) by mouth daily.   LORazepam (ATIVAN) 0.5 MG tablet TAKE ONE TABLET BY MOUTH twice daily AS NEEDED FOR ANXIETY. Do not drive 8 hours after using.   Multiple Vitamins-Minerals (MULTIVITAMIN ADULT PO) Take 1 tablet by mouth daily.   nitroGLYCERIN (NITROSTAT) 0.4 MG SL tablet Place 1 tablet (0.4 mg total) under the tongue every 5 (five) minutes as needed for chest pain (if pain does not resolve with 2 doses, call 911).   rosuvastatin (CRESTOR) 40 MG tablet Take 1 tablet (40 mg total) by mouth daily.   valACYclovir (VALTREX) 1000 MG tablet Take 1,000 mg by mouth daily as needed (for fever blisters).   [DISCONTINUED] Aclidinium Bromide (TUDORZA PRESSAIR) 400 MCG/ACT AEPB Inhale 1 puff into the lungs 2 (two) times daily.   No facility-administered encounter medications on file as of 11/07/2022.   BP Readings from Last 3 Encounters:  09/26/22 120/70  09/21/22 118/60  09/06/22 124/60    Pulse Readings from Last 3 Encounters:  09/26/22 77  09/21/22 73  09/06/22 77    Lab Results  Component Value Date/Time   HGBA1C 6.2 (H) 04/27/2011 11:59 AM   Lab Results  Component Value Date   CREATININE 0.58 08/29/2022   BUN 9 08/29/2022   GFR 84.66 03/05/2022   GFRNONAA >60 08/29/2022   GFRAA 109 07/01/2019   NA 137 08/29/2022   K 3.8 08/29/2022   CALCIUM 8.7 (L) 08/29/2022   CO2 28 08/29/2022     Future Appointments  Date Time Provider [place]  12/14/2022  2:00 PM Marin Olp, MD [facility]  01/25/2023  9:20 AM Sherren Mocha, MD [facility]  06/10/2023  8:30 AM [facility] HEALTH COACH [facility]   [name], [place] Pharmacist Assistant [phone]

## 2022-11-08 ENCOUNTER — Other Ambulatory Visit: Payer: Self-pay

## 2022-11-08 MED ORDER — LORAZEPAM 0.5 MG PO TABS: ORAL_TABLET | ORAL | 1 refills | Status: DC

## 2022-11-08 MED ORDER — DESVENLAFAXINE SUCCINATE ER 100 MG PO TB24: 100.0000 mg | ORAL_TABLET | Freq: Every morning | ORAL | 2 refills | Status: DC

## 2022-11-08 MED ORDER — GABAPENTIN 300 MG PO CAPS: ORAL_CAPSULE | ORAL | 3 refills | Status: DC

## 2022-11-08 NOTE — Telephone Encounter (Signed)
-----   Message from [name] sent at 11/08/2022  8:57 AM EST ----- Regarding: Rx refill request Patient has an upcoming delivery with [facility]. She will need refills on the following medications to complete her order:  Desvenlafaxine 100 mg-Take one tab by mouth once daily Gabapentin 300 mg Take one capsule by mouth everyday at bedtime Lorazepam 0.5 mg twice daily as needed  Please send refills to [facility].  Thank you,  [name], [place] Pharmacist Assistant [phone]

## 2022-12-06 ENCOUNTER — Telehealth: Payer: Self-pay | Admitting: Pharmacist

## 2022-12-06 NOTE — Progress Notes (Signed)
Care Management & Coordination Services Pharmacy Team   Reason for Encounter: Medication coordination and delivery   Contacted patient to discuss medications and coordinate delivery from [facility]. Unsuccessful outreach. Left voicemail for patient to return call. Patient returned call and verified medications and delivery date and time.  Cycle dispensing form sent to Erskine Emeryhris Davis, CPP for review.   Last adherence delivery date: 11/19/22      Patient is due for next adherence delivery on: 12/18/22  This delivery to include: Vials  30 Days   Gabapentin 300 mg Take one capsule by mouth everyday at bedtime Ezetimibe 10 mg Tab Take one tab by mouth once daily Dexlansoprazole 60 mg Cap Take one capsule once daily Desvenlafaxine 100 mg-Take one tab by mouth once daily Amlodipine 5 mg once daily Rosuvastatin 40 mg- Take one tab by mouth once daily Bupropion HCL XL 300 mg - once daily Lorazepam 0.5 mg twice daily as needed Isosorbide 30 mg 1/2 tablet once daily Beztri inhaler   Patient declined the following medications this month: Albuterol inhaler (has plenty on hand)  No refill request needed.  Confirmed delivery date of 12/18/22, advised patient that pharmacy will contact them the morning of delivery.   Any concerns about your medications? No  How often do you forget or accidentally miss a dose? Rarely  Do you use a pillbox? No  Is patient in packaging No  If yes  What is the date on your next pill pack?  Any concerns or issues with your packaging?   Medications: Outpatient Encounter Medications as of 12/06/2022  Medication Sig   acetaminophen (TYLENOL) 650 MG CR tablet Take 1,300 mg by mouth every 8 (eight) hours as needed for pain.   albuterol (VENTOLIN HFA) 108 (90 Base) MCG/ACT inhaler Inhale 2 puffs into the lungs every 6 (six) hours as needed for wheezing or shortness of breath.   amLODipine (NORVASC) 5 MG tablet TAKE ONE TABLET BY MOUTH ONCE DAILY   aspirin  EC 81 MG tablet Take 1 tablet (81 mg total) by mouth daily.   benzonatate (TESSALON) 200 MG capsule Take 1 capsule (200 mg total) by mouth 3 (three) times daily. (Patient not taking: Reported on 09/26/2022)   Budeson-Glycopyrrol-Formoterol (BREZTRI AEROSPHERE) 160-9-4.8 MCG/ACT AERO Inhale 2 puffs into the lungs in the morning and at bedtime.   buPROPion (WELLBUTRIN XL) 300 MG 24 hr tablet Take 1 tablet (300 mg total) by mouth daily.   Calcium Carb-Cholecalciferol (CALCIUM 500/D PO) Take 2 tablets by mouth daily. Mag and zinc added   cetirizine (ZYRTEC) 10 MG tablet Take 10 mg by mouth at bedtime.   desvenlafaxine (PRISTIQ) 100 MG 24 hr tablet Take 1 tablet (100 mg total) by mouth every morning.   dexlansoprazole (DEXILANT) 60 MG capsule TAKE ONE CAPSULE BY MOUTH ONCE DAILY (Patient taking differently: Take 60 mg by mouth daily.)   ezetimibe (ZETIA) 10 MG tablet TAKE ONE TABLET BY MOUTH ONCE DAILY   feeding supplement (ENSURE ENLIVE / ENSURE PLUS) LIQD Take 237 mLs by mouth 2 (two) times daily between meals.   fluticasone (CUTIVATE) 0.05 % cream Apply 1 Application topically 2 (two) times daily as needed (eczema).   fluticasone (FLONASE) 50 MCG/ACT nasal spray Place 2 sprays into both nostrils as needed for allergies or rhinitis.   gabapentin (NEURONTIN) 300 MG capsule TAKE ONE CAPSULE BY MOUTH EVERYDAY AT BEDTIME   ipratropium (ATROVENT) 0.03 % nasal spray Place 2 sprays into both nostrils every 12 (twelve) hours.   ipratropium-albuterol (  DUONEB) 0.5-2.5 (3) MG/3ML SOLN Take 3 mLs by nebulization every 6 (six) hours as needed.   isosorbide mononitrate (IMDUR) 30 MG 24 hr tablet Take 0.5 tablets (15 mg total) by mouth daily.   LORazepam (ATIVAN) 0.5 MG tablet TAKE ONE TABLET BY MOUTH twice daily AS NEEDED FOR ANXIETY. Do not drive 8 hours after using.   Multiple Vitamins-Minerals (MULTIVITAMIN ADULT PO) Take 1 tablet by mouth daily.   nitroGLYCERIN (NITROSTAT) 0.4 MG SL tablet Place 1 tablet (0.4 mg  total) under the tongue every 5 (five) minutes as needed for chest pain (if pain does not resolve with 2 doses, call 911).   rosuvastatin (CRESTOR) 40 MG tablet Take 1 tablet (40 mg total) by mouth daily.   valACYclovir (VALTREX) 1000 MG tablet Take 1,000 mg by mouth daily as needed (for fever blisters).   [DISCONTINUED] Aclidinium Bromide (TUDORZA PRESSAIR) 400 MCG/ACT AEPB Inhale 1 puff into the lungs 2 (two) times daily.   No facility-administered encounter medications on file as of 12/06/2022.   BP Readings from Last 3 Encounters:  09/26/22 120/70  09/21/22 118/60  09/06/22 124/60    Pulse Readings from Last 3 Encounters:  09/26/22 77  09/21/22 73  09/06/22 77    Lab Results  Component Value Date/Time   HGBA1C 6.2 (H) 04/27/2011 11:59 AM   Lab Results  Component Value Date   CREATININE 0.58 08/29/2022   BUN 9 08/29/2022   GFR 84.66 03/05/2022   GFRNONAA >60 08/29/2022   GFRAA 109 07/01/2019   NA 137 08/29/2022   K 3.8 08/29/2022   CALCIUM 8.7 (L) 08/29/2022   CO2 28 08/29/2022     Future Appointments  Date Time Provider Department Center  12/14/2022  2:00 PM Shelva MajesticHunter, Stephen O, [facility]  01/25/2023  9:20 AM Tonny Bollmanooper, Michael, MD [facility]  06/10/2023  8:30 AM [facility] ANNUAL WELLNESS VISIT 1 [facility]    [facility], [address]

## 2022-12-14 ENCOUNTER — Encounter: Payer: Self-pay | Admitting: Family Medicine

## 2022-12-14 ENCOUNTER — Ambulatory Visit (INDEPENDENT_AMBULATORY_CARE_PROVIDER_SITE_OTHER): Payer: Medicare HMO | Admitting: Family Medicine

## 2022-12-14 ENCOUNTER — Other Ambulatory Visit: Payer: Self-pay

## 2022-12-14 VITALS — BP 128/62 | HR 68 | Temp 97.2°F | Ht 62.0 in | Wt 127.6 lb

## 2022-12-14 DIAGNOSIS — Z87891 Personal history of nicotine dependence: Secondary | ICD-10-CM

## 2022-12-14 DIAGNOSIS — R739 Hyperglycemia, unspecified: Secondary | ICD-10-CM | POA: Diagnosis not present

## 2022-12-14 DIAGNOSIS — M8588 Other specified disorders of bone density and structure, other site: Secondary | ICD-10-CM | POA: Diagnosis not present

## 2022-12-14 DIAGNOSIS — Z0000 Encounter for general adult medical examination without abnormal findings: Secondary | ICD-10-CM | POA: Diagnosis not present

## 2022-12-14 DIAGNOSIS — E785 Hyperlipidemia, unspecified: Secondary | ICD-10-CM | POA: Diagnosis not present

## 2022-12-14 DIAGNOSIS — Z131 Encounter for screening for diabetes mellitus: Secondary | ICD-10-CM | POA: Diagnosis not present

## 2022-12-14 LAB — CBC WITH DIFFERENTIAL/PLATELET
Eosinophils Absolute: 41 cells/uL (ref 15–500)
HCT: 35.4 % (ref 35.0–45.0)
MCH: 28.4 pg (ref 27.0–33.0)
MCHC: 31.6 g/dL — ABNORMAL LOW (ref 32.0–36.0)
MCV: 89.6 fL (ref 80.0–100.0)
Neutro Abs: 2878 cells/uL (ref 1500–7800)
Neutrophils Relative %: 70.2 %
Platelets: 170 10*3/uL (ref 140–400)

## 2022-12-14 MED ORDER — LORAZEPAM 0.5 MG PO TABS: ORAL_TABLET | ORAL | 1 refills | Status: DC

## 2022-12-14 MED ORDER — VALACYCLOVIR HCL 1 G PO TABS: ORAL_TABLET | ORAL | 1 refills | Status: DC

## 2022-12-14 NOTE — Addendum Note (Signed)
Addended by: Lorn Junes on: 12/14/2022 03:13 PM   Modules accepted: Orders

## 2022-12-14 NOTE — Patient Instructions (Addendum)
Really appreciate you calling the breast center soon to get scheduled for mammogram [facility] Schedule an appointment by calling [phone].  Please stop by lab before you go If you have mychart- we will send your results within 3 business days of [place] receiving them.  If you do not have mychart- we will call you about results within 5 business days of [place] receiving them.  *please also note that you will see labs on mychart as soon as they post. I will later go in and write notes on them- will say "notes from Dr. Durene Cal"   Recommended follow up: Return in about 3 months (around 03/15/2023) for followup or sooner if needed.Schedule b4 you leave.

## 2022-12-14 NOTE — Progress Notes (Signed)
Phone [phone]   Subjective:  Patient presents today for their annual physical. Chief complaint-noted.   See problem oriented charting- ROS- full  review of systems was completed and negative except for: allergies- ear pressure, post nasal drip, runny nose, sinus pressure, sneezing, eye itching, shortness of breath and wheezing from copd, cold intolerance, joint pain and back pain, tremors- worse after December copd hospitalization, bruise easily, agitation, decreased concentration, sad mood, anxiety, sleep issues , mild ankle edema- can get some tingling  The following were reviewed and entered/updated in epic: Past Medical History:  Diagnosis Date   Adenomatous colon polyp    Anxiety    Anxiety/Depression 02/15/2009   Aortic stenosis s/p bioprosthetic AVR 07/07/2010   Echo 9/19:  Mild conc LVH, EF 60-65, no RWMA, Gr 1 DD, AVR ok (mean 10, peak 21), trivial MR, normal RVSF, mild TR // Echo 10/22: EF 60-65, no RWMA, normal RVSF, AVR with increased gradients (mean 24 increased from 10 in 2019) // Echocardiogram 10/23: EF 60-65, no RWMA, mildly reduced RVSF, AVR w mean gradient 17.3 mmHg   Barrett's esophagus    04/2007; 2000   Carotid stenosis 05/17/2010   a. dopplers 11/11: RICA 40-50%; LICA 0-39% (follow up due 11/12)   COPD 07/23/2007   Coronary Artery Disease 05/17/2010   NSTEMI 8/11 - BMS to RCA;  cath 4/12: dLAD 50%, RI 80% (PCI), AVCFX 30%, pRCA 30%, stent ok, 40-50, then 50% dist RCA;  c. s/p Promus DES to RI 12/25/10;  d. 04/2011 CABG x 1 (LIMA->LAD) @ time of AVR // Cath in 9/19: RI and RCA stents ok; L-LAD atretic, mild disease in mLAD >> med Rx   Family history of breast cancer    2 daughters; PALB2 mutation in family   Genetic testing 02/10/2018   PALB2 analysis at [facility] - Negative for familial mutation   GERD    H/O hiatal hernia    Hemorrhoid thrombosis    "had it lanced" (03/25/2013)   Herpes simplex without mention of complication 01/16/2010   History of blood  transfusion    'w/OHS" (03/25/2013)   Hyperlipidemia 08/29/2009   Hypertension 04/14/2007   Migraine HAs 04/20/2008   Myocardial infarction 2011   "during catherization" (03/25/2013)   OSTEOPENIA 04/14/2007   Ovarian cyst    Parotid mass    left   Pneumonia    "multiple times" (03/25/2013)   Urine, incontinence, stress female    Patient Active Problem List   Diagnosis Date Noted   H/O parotidectomy 04/28/2019    Priority: High   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    Priority: High   Depression 09/22/2015    Priority: High   Former smoker 09/22/2015    Priority: High   S/P AVR (aortic valve replacement) 11/06/2013    Priority: High   Aortic stenosis 07/07/2010    Priority: High   Coronary artery disease involving native coronary artery with angina pectoris 05/17/2010    Priority: High   COPD (chronic obstructive pulmonary disease) 07/23/2007    Priority: High   Aortic atherosclerosis 03/10/2020    Priority: Medium    Restless legs 08/25/2019    Priority: Medium    Hot flashes 03/22/2016    Priority: Medium    Diastolic dysfunction 05/31/2015    Priority: Medium    GERD (gastroesophageal reflux disease) 06/04/2014    Priority: Medium    Carotid stenosis 05/17/2010    Priority: Medium    HLD (hyperlipidemia) 08/29/2009    Priority: Medium  Essential hypertension 04/14/2007    Priority: Medium    Osteopenia 04/14/2007    Priority: Medium    Genetic testing 02/10/2018    Priority: Low   Family history of breast cancer     Priority: Low   Allergic rhinitis 09/22/2015    Priority: Low   Adenomatous colon polyp     Priority: Low   Herpes simplex virus (HSV) infection 01/16/2010    Priority: Low   Recurrent major depressive disorder, in partial remission 09/06/2022   Influenza A 08/28/2022   COVID-19 07/31/2022   Hypokalemia 07/31/2022   Anemia 07/31/2022   Coronary artery disease involving native coronary artery of native heart without angina pectoris  07/23/2022   Precordial chest pain 11/06/2013   Past Surgical History:  Procedure Laterality Date   AORTIC VALVE REPLACEMENT  2012   APPENDECTOMY     BLEPHAROPLASTY Bilateral ~ 1998   CARDIAC CATHETERIZATION N/A 07/06/2016   Procedure: Left Heart Cath and Cors/Grafts Angiography;  Surgeon: Tonny Bollman, MD;  Location: [facility] INVASIVE CV LAB;  Service: Cardiovascular;  Laterality: N/A;   CHOLECYSTECTOMY N/A 03/25/2013   Procedure: LAPAROSCOPIC CHOLECYSTECTOMY WITH INTRAOPERATIVE CHOLANGIOGRAM;  Surgeon: Wilmon Arms. Corliss Skains, MD;  Location: [facility] OR;  Service: General;  Laterality: N/A;   CORONARY ANGIOGRAPHY N/A 05/26/2018   Procedure: CORONARY ANGIOGRAPHY (CATH LAB);  Surgeon: Corky Crafts, MD;  Location: [facility] INVASIVE CV LAB;  Service: Cardiovascular;  Laterality: N/A;   CORONARY ANGIOPLASTY WITH STENT PLACEMENT  2010   "I have 2 stents" (03/25/2013)   NASAL SINUS SURGERY  1997; 1998   "cust in maxillary sinus; put a stent in then removed it" (03/25/2013)   ORIF ANKLE FRACTURE Right 02/14/2021   Procedure: OPEN TREATMENT OF RIGHT TRIMALLEOLAR ANKLE FRACTURE WITHOUT POSTERIOR FIXATION, SYNDESMOSIS;  Surgeon: Terance Hart, MD;  Location: [facility];  Service: Orthopedics;  Laterality: Right;   OVARIAN CYST REMOVAL Right 2011   "size of a soccer ball" (03/25/2013)   PAROTIDECTOMY Left 04/28/2019   Procedure: LEFT TOTAL PAROTIDECTOMY;  Surgeon: Newman Pies, MD;  Location: [facility];  Service: ENT;  Laterality: Left;   RIGHT HEART CATH AND CORONARY ANGIOGRAPHY N/A 07/23/2022   Procedure: RIGHT HEART CATH AND CORONARY ANGIOGRAPHY;  Surgeon: Kathleene Hazel, MD;  Location: [facility] INVASIVE CV LAB;  Service: Cardiovascular;  Laterality: N/A;   thrombosed hemorrohid lanced     TONSILLECTOMY AND ADENOIDECTOMY     TOTAL ABDOMINAL HYSTERECTOMY      Family History  Problem Relation Age of Onset   Coronary artery disease Other    Heart disease Other        6 stents    Heart disease Mother    COPD Father        smoked   Heart disease Father    Rheum arthritis Father    Heart disease Maternal Uncle    Breast cancer Daughter        currently 87   Asthma Son        as a child    Breast cancer Daughter 50       currently 59; PALB2 mutation   Cancer Other        very distant paternal cousin; pancreatic ca   Cirrhosis Sister    Stroke Brother    Colon cancer Neg Hx    Colon polyps Neg Hx     Medications- reviewed and updated Current Outpatient Medications  Medication Sig Dispense Refill   acetaminophen (TYLENOL) 650 MG  CR tablet Take 1,300 mg by mouth every 8 (eight) hours as needed for pain.     albuterol (VENTOLIN HFA) 108 (90 Base) MCG/ACT inhaler Inhale 2 puffs into the lungs every 6 (six) hours as needed for wheezing or shortness of breath. 8 g 3   amLODipine (NORVASC) 5 MG tablet TAKE ONE TABLET BY MOUTH ONCE DAILY 90 tablet 3   aspirin EC 81 MG tablet Take 1 tablet (81 mg total) by mouth daily.     Budeson-Glycopyrrol-Formoterol (BREZTRI AEROSPHERE) 160-9-4.8 MCG/ACT AERO Inhale 2 puffs into the lungs in the morning and at bedtime. 1 each 6   buPROPion (WELLBUTRIN XL) 300 MG 24 hr tablet Take 1 tablet (300 mg total) by mouth daily. 90 tablet 3   Calcium Carb-Cholecalciferol (CALCIUM 500/D PO) Take 2 tablets by mouth daily. Mag and zinc added     cetirizine (ZYRTEC) 10 MG tablet Take 10 mg by mouth at bedtime.     desvenlafaxine (PRISTIQ) 100 MG 24 hr tablet Take 1 tablet (100 mg total) by mouth every morning. 90 tablet 2   dexlansoprazole (DEXILANT) 60 MG capsule TAKE ONE CAPSULE BY MOUTH ONCE DAILY (Patient taking differently: Take 60 mg by mouth daily.) 90 capsule 3   ezetimibe (ZETIA) 10 MG tablet TAKE ONE TABLET BY MOUTH ONCE DAILY 90 tablet 1   feeding supplement (ENSURE ENLIVE / ENSURE PLUS) LIQD Take 237 mLs by mouth 2 (two) times daily between meals.     fluticasone (CUTIVATE) 0.05 % cream Apply 1 Application topically 2 (two) times  daily as needed (eczema).     fluticasone (FLONASE) 50 MCG/ACT nasal spray Place 2 sprays into both nostrils as needed for allergies or rhinitis. 16 g 3   gabapentin (NEURONTIN) 300 MG capsule TAKE ONE CAPSULE BY MOUTH EVERYDAY AT BEDTIME 90 capsule 3   ipratropium (ATROVENT) 0.03 % nasal spray Place 2 sprays into both nostrils every 12 (twelve) hours. 30 mL 2   ipratropium-albuterol (DUONEB) 0.5-2.5 (3) MG/3ML SOLN Take 3 mLs by nebulization every 6 (six) hours as needed. 360 mL 4   isosorbide mononitrate (IMDUR) 30 MG 24 hr tablet Take 0.5 tablets (15 mg total) by mouth daily. 45 tablet 3   Multiple Vitamins-Minerals (MULTIVITAMIN ADULT PO) Take 1 tablet by mouth daily.     nitroGLYCERIN (NITROSTAT) 0.4 MG SL tablet Place 1 tablet (0.4 mg total) under the tongue every 5 (five) minutes as needed for chest pain (if pain does not resolve with 2 doses, call 911). 30 tablet 5   rosuvastatin (CRESTOR) 40 MG tablet Take 1 tablet (40 mg total) by mouth daily. 90 tablet 2   valACYclovir (VALTREX) 1000 MG tablet Take 2 pills twice a day for 1 day at first sign of cold sore 30 tablet 1   LORazepam (ATIVAN) 0.5 MG tablet TAKE ONE TABLET BY MOUTH twice daily AS NEEDED FOR ANXIETY. Do not drive 8 hours after using. 60 tablet 1   No current facility-administered medications for this visit.    Allergies-reviewed and updated Allergies  Allergen Reactions   Codeine Sulfate Itching and Rash   Hydrocodone-Acetaminophen Itching and Rash    Social History   Social History Narrative   Lost fiancee of 22 years in 2022. 3 children (2 living). 6 grandkids. 2 greatgrandkids. Keeps greatgrandson on tuesdays- loves her so much.       Retired from [employer] (parttime- Dec 12)   Faith is very important to her      Hobbies: reading, cross stich,  sew. Wants to learn to knit.       Objective  Objective:  BP 128/62   Pulse 68   Temp (!) 97.2 F (36.2 C)   Ht 5\' 2"  (1.575 m)   Wt 127 lb 9.6 oz (57.9 kg)   SpO2  95%   BMI 23.34 kg/m  Gen: NAD, resting comfortably HEENT: Mucous membranes are moist. Oropharynx normal Neck: no thyromegaly CV: RRR stable murmur- radiates to carotids Lungs: CTAB no crackles, wheeze, rhonchi Abdomen: soft/nontender/nondistended/normal bowel sounds. No rebound or guarding.  Ext: trace edema Skin: warm, dry Neuro: grossly normal, moves all extremities, PERRLA   Assessment and Plan   72 y.o. female presenting for annual physical.  Health Maintenance counseling: 1. Anticipatory guidance: Patient counseled regarding regular dental exams - dentures in place, eye exams - at least yearly,  avoiding smoking and second hand smoke, limiting alcohol to 1 beverage per day- doesn't drink , no illicit drugs.   2. Risk factor reduction:  Advised patient of need for regular exercise and diet rich and fruits and vegetables to reduce risk of heart attack and stroke.  Exercise- walking daily with her dog.  Diet/weight management-thankfully has gained weight from last year- stay stable at this poitn.  Wt Readings from Last 3 Encounters:  12/14/22 127 lb 9.6 oz (57.9 kg)  09/26/22 126 lb 12.8 oz (57.5 kg)  09/21/22 128 lb (58.1 kg)  3. Immunizations/screenings/ancillary studies-declines COVID-19 vaccination  Immunization History  Administered Date(s) Administered   Fluad Quad(high Dose 65+) 07/14/2020   Influenza Split 06/19/2011, 07/02/2012   Influenza Whole 08/03/2009   Influenza, High Dose Seasonal PF 05/20/2015, 06/04/2019, 06/04/2019   Influenza,inj,Quad PF,6+ Mos 05/21/2013, 05/14/2014, 06/17/2018   Influenza-Unspecified 08/17/2016, 06/19/2017, 06/17/2018   PFIZER(Purple Top)SARS-COV-2 Vaccination 10/20/2019, 11/09/2019, 06/30/2020   Pneumococcal Conjugate-13 06/14/2014   Pneumococcal Polysaccharide-23 03/22/2016   Td 08/29/2009, 03/17/2020   Zoster Recombinat (Shingrix) 03/10/2018, 07/15/2018   Zoster, Live 01/21/2016  4. Cervical cancer screening- past age based  screening recommendations plus total hysterectomy 5. Breast cancer screening-declines mammogram-we discussed potential to miss breast cancer- she agrees to consider- gave her # 6. Colon cancer screening - 07/14/2014 with 10-year repeat-will be due next year  7. Skin cancer screening- dermatology yearly- [facility]. advised regular sunscreen use. Denies worrisome, changing, or new skin lesions.  8. Birth control/STD check- postmenopausal/not sexually active- does not really imagine dating again at this point  69. Osteoporosis screening at 65- 03/09/2020-discussed repeat at this time- she declines for now- plan for next year 10. Smoking associated screening -former smoker-quit in 2007-past screening guidelines for lung cancer screening  Status of chronic or acute concerns   #social update- ended up getting her own place- big stressor off her shoulders  #cataract surgery likely - 23rd of this month for evaluation  -also has stye and using warm compresses   #CAD-Status post prior PCI to the RCA and RI and subsequent CABG in 2012.  Cardiac catheterization in 2019 demonstrated patent stents in the RCA and RI, atretic LIMA-LAD and mild nonobstructive disease in the LAD.  -Also had cath on 07/23/2022-plan was medical management #hyperlipidemia with aortic atherosclerosis S: Medication:Aspirin 81 mg, Zetia 10 mg, rosuvastatin 40 mg, Imdur 30 mg. No chest pain. Stable shortness of breath  Lab Results  Component Value Date   CHOL 112 03/05/2022   HDL 51.20 03/05/2022   LDLCALC 44 03/05/2022   LDLDIRECT 65.0 06/19/2017   TRIG 86.0 03/05/2022   CHOLHDL 2 03/05/2022  A/P: CAD-asymptomatic -  shortness of breath likely from copd- continue current medications  Hyperlipidemia-at goal last visit and not quite due- hold of for now  Aortic atherosclerosis (presumed stable)- LDL goal ideally <70 - continue current medications   % Aortic valve disease status post aortic valve replacement-follows with  cardiology including echocardiograms and has SBE prophylaxis    #hypertension S: medication: Amlodipine 5 mg, Imdur 30 mg BP Readings from Last 3 Encounters:  12/14/22 128/62  09/26/22 120/70  09/21/22 118/60  A/P: stable- continue current medicines   # Depression # Anxiety S:Medication: Pristiq 100 mg, Wellbutrin 300 mg extended release, lorazepam 0.5 mg twice daily as needed -Contributing factors-also fianc of 22 years at age 53 in 2020 and previously lost her best friend/daughter Angie in 2019    12/14/2022    1:55 PM 09/06/2022    4:00 PM 06/07/2022    8:24 AM  Depression screen PHQ 2/9  Decreased Interest Down, Depressed, Hopeless PHQ - 2 Score Altered sleeping 3 0 3  Tired, decreased energy 3 0 3  Change in appetite 0 0 0  Feeling bad or failure about yourself  1 0 0  Trouble concentrating 2 0 0  Moving slowly or fidgety/restless 0 0 0  Suicidal thoughts 0 0 0  PHQ-9 Score Difficult doing work/chores Somewhat difficult Not difficult at all   A/P: depression she reports under partial remission- some ups and downs- recent stressors with [employer]- that's weighed on her recently- wants to hold steady on meds   #COPD/former smoker-saw Dr. 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 Future Appointments  Date Time Provider Department Center  01/25/2023  9:20 AM Tonny Bollman, MD [facility]  06/10/2023  8:30 AM [facility] ANNUAL WELLNESS VISIT 1 [facility]   Lab/Order associations: fasting   ICD-10-CM   1. Preventative health care  Z00.00     2. Former smoker  Z87.891 Urinalysis, Routine w reflex microscopic    3. Hyperlipidemia, unspecified hyperlipidemia type  E78.5 Comprehensive metabolic panel    CBC with Differential/Platelet    TSH    4. Screening for diabetes mellitus  Z13.1 Hemoglobin A1c    5. Osteopenia of lumbar spine  M85.88     6. Hyperglycemia  R73.9 Hemoglobin A1c     Meds ordered this encounter  Medications   LORazepam (ATIVAN) 0.5 MG tablet    Sig: TAKE ONE TABLET BY MOUTH twice daily AS NEEDED FOR ANXIETY. Do not drive 8 hours after using.    Dispense:  60 tablet    Refill:  1    This prescription was filled on 08/16/2022. Any refills authorized will be placed on file.   valACYclovir (VALTREX) 1000 MG tablet  Sig: Take 2 pills twice a day for 1 day at first sign of cold sore    Dispense:  30 tablet    Refill:  1    Return precautions advised.  Tana Conch, MD

## 2022-12-15 LAB — URINALYSIS, ROUTINE W REFLEX MICROSCOPIC
Bilirubin Urine: NEGATIVE
Glucose, UA: NEGATIVE
Hgb urine dipstick: NEGATIVE
Ketones, ur: NEGATIVE
Leukocytes,Ua: NEGATIVE
Nitrite: NEGATIVE
Protein, ur: NEGATIVE
Specific Gravity, Urine: 1.006 (ref 1.001–1.035)
pH: 6.5 (ref 5.0–8.0)

## 2022-12-15 LAB — COMPREHENSIVE METABOLIC PANEL
AG Ratio: 2.3 (calc) (ref 1.0–2.5)
ALT: 23 U/L (ref 6–29)
AST: 31 U/L (ref 10–35)
Albumin: 4.1 g/dL (ref 3.6–5.1)
Alkaline phosphatase (APISO): 58 U/L (ref 37–153)
BUN: 11 mg/dL (ref 7–25)
CO2: 27 mmol/L (ref 20–32)
Calcium: 8.6 mg/dL (ref 8.6–10.4)
Chloride: 103 mmol/L (ref 98–110)
Creat: 0.6 mg/dL (ref 0.60–1.00)
Globulin: 1.8 g/dL (calc) — ABNORMAL LOW (ref 1.9–3.7)
Glucose, Bld: 87 mg/dL (ref 65–99)
Potassium: 4.4 mmol/L (ref 3.5–5.3)
Sodium: 139 mmol/L (ref 135–146)
Total Bilirubin: 0.3 mg/dL (ref 0.2–1.2)
Total Protein: 5.9 g/dL — ABNORMAL LOW (ref 6.1–8.1)

## 2022-12-15 LAB — CBC WITH DIFFERENTIAL/PLATELET
Absolute Monocytes: 435 cells/uL (ref 200–950)
Basophils Absolute: 29 cells/uL (ref 0–200)
Basophils Relative: 0.7 %
Eosinophils Relative: 1 %
Hemoglobin: 11.2 g/dL — ABNORMAL LOW (ref 11.7–15.5)
Lymphs Abs: 718 cells/uL — ABNORMAL LOW (ref 850–3900)
MPV: 11.2 fL (ref 7.5–12.5)
Monocytes Relative: 10.6 %
RBC: 3.95 10*6/uL (ref 3.80–5.10)
RDW: 13.1 % (ref 11.0–15.0)
Total Lymphocyte: 17.5 %
WBC: 4.1 10*3/uL (ref 3.8–10.8)

## 2022-12-15 LAB — HEMOGLOBIN A1C
Hgb A1c MFr Bld: 6.2 % of total Hgb — ABNORMAL HIGH (ref <5.7)
Mean Plasma Glucose: 131 mg/dL
eAG (mmol/L): 7.3 mmol/L

## 2022-12-15 LAB — TSH: TSH: 0.34 mIU/L — ABNORMAL LOW (ref 0.40–4.50)

## 2022-12-18 ENCOUNTER — Other Ambulatory Visit: Payer: Self-pay

## 2022-12-18 DIAGNOSIS — E785 Hyperlipidemia, unspecified: Secondary | ICD-10-CM

## 2022-12-18 DIAGNOSIS — D649 Anemia, unspecified: Secondary | ICD-10-CM

## 2022-12-21 DIAGNOSIS — J342 Deviated nasal septum: Secondary | ICD-10-CM | POA: Diagnosis not present

## 2022-12-21 DIAGNOSIS — H903 Sensorineural hearing loss, bilateral: Secondary | ICD-10-CM | POA: Diagnosis not present

## 2022-12-21 DIAGNOSIS — J343 Hypertrophy of nasal turbinates: Secondary | ICD-10-CM | POA: Diagnosis not present

## 2022-12-21 DIAGNOSIS — R0982 Postnasal drip: Secondary | ICD-10-CM | POA: Diagnosis not present

## 2022-12-21 DIAGNOSIS — J310 Chronic rhinitis: Secondary | ICD-10-CM | POA: Diagnosis not present

## 2022-12-21 DIAGNOSIS — H9202 Otalgia, left ear: Secondary | ICD-10-CM | POA: Diagnosis not present

## 2022-12-25 DIAGNOSIS — Z01818 Encounter for other preprocedural examination: Secondary | ICD-10-CM | POA: Diagnosis not present

## 2022-12-25 DIAGNOSIS — H25811 Combined forms of age-related cataract, right eye: Secondary | ICD-10-CM | POA: Diagnosis not present

## 2022-12-25 DIAGNOSIS — H40013 Open angle with borderline findings, low risk, bilateral: Secondary | ICD-10-CM | POA: Diagnosis not present

## 2022-12-25 DIAGNOSIS — H25812 Combined forms of age-related cataract, left eye: Secondary | ICD-10-CM | POA: Diagnosis not present

## 2023-01-01 ENCOUNTER — Other Ambulatory Visit: Payer: Medicare HMO

## 2023-01-03 DIAGNOSIS — H25811 Combined forms of age-related cataract, right eye: Secondary | ICD-10-CM | POA: Diagnosis not present

## 2023-01-03 DIAGNOSIS — H269 Unspecified cataract: Secondary | ICD-10-CM | POA: Diagnosis not present

## 2023-01-03 DIAGNOSIS — H2511 Age-related nuclear cataract, right eye: Secondary | ICD-10-CM | POA: Diagnosis not present

## 2023-01-04 ENCOUNTER — Telehealth: Payer: Self-pay | Admitting: Pharmacist

## 2023-01-04 NOTE — Progress Notes (Signed)
Care Management & Coordination Services Pharmacy Team  Reason for Encounter: Medication coordination and delivery  Contacted patient to discuss medications and coordinate delivery from [facility]. Spoke with patient on 01/04/2023  Cycle dispensing form sent to [facility] for review.   Last adherence delivery date: 12/18/2022      Patient is due for next adherence delivery on: 01/16/2023  This delivery to include: Vials  30 Days  Gabapentin 300 mg Take one capsule by mouth everyday at bedtime Ezetimibe 10 mg Tab Take one tab by mouth once daily Dexlansoprazole 60 mg Cap Take one capsule once daily Desvenlafaxine 100 mg-Take one tab by mouth once daily Amlodipine 5 mg once daily Rosuvastatin 40 mg- Take one tab by mouth once daily Bupropion HCL XL 300 mg - once daily Lorazepam 0.5 mg twice daily as needed Isosorbide 30 mg 1/2 tablet once daily  Patient declined the following medications this month: Beztri inhaler  No refill request needed.  Confirmed delivery date of 12/18/2022, advised patient that pharmacy will contact them the morning of delivery.   Any concerns about your medications? No  How often do you forget or accidentally miss a dose? Rarely  Is patient in packaging No  If yes  What is the date on your next pill pack?  Any concerns or issues with your packaging?   Chart review: Recent office visits:  12/14/2022 [facility] (PCP) Shelva Majestic, MD; no medication changes indicated.  Recent consult visits:  None  Hospital visits:  None in previous 6 months  Medications: Outpatient Encounter Medications as of 01/04/2023  Medication Sig   acetaminophen (TYLENOL) 650 MG CR tablet Take 1,300 mg by mouth every 8 (eight) hours as needed for pain.   albuterol (VENTOLIN HFA) 108 (90 Base) MCG/ACT inhaler Inhale 2 puffs into the lungs every 6 (six) hours as needed for wheezing or shortness of breath.   amLODipine (NORVASC) 5 MG tablet TAKE ONE TABLET BY MOUTH ONCE  DAILY   aspirin EC 81 MG tablet Take 1 tablet (81 mg total) by mouth daily.   Budeson-Glycopyrrol-Formoterol (BREZTRI AEROSPHERE) 160-9-4.8 MCG/ACT AERO Inhale 2 puffs into the lungs in the morning and at bedtime.   buPROPion (WELLBUTRIN XL) 300 MG 24 hr tablet Take 1 tablet (300 mg total) by mouth daily.   Calcium Carb-Cholecalciferol (CALCIUM 500/D PO) Take 2 tablets by mouth daily. Mag and zinc added   cetirizine (ZYRTEC) 10 MG tablet Take 10 mg by mouth at bedtime.   desvenlafaxine (PRISTIQ) 100 MG 24 hr tablet Take 1 tablet (100 mg total) by mouth every morning.   dexlansoprazole (DEXILANT) 60 MG capsule TAKE ONE CAPSULE BY MOUTH ONCE DAILY (Patient taking differently: Take 60 mg by mouth daily.)   ezetimibe (ZETIA) 10 MG tablet TAKE ONE TABLET BY MOUTH ONCE DAILY   feeding supplement (ENSURE ENLIVE / ENSURE PLUS) LIQD Take 237 mLs by mouth 2 (two) times daily between meals.   fluticasone (CUTIVATE) 0.05 % cream Apply 1 Application topically 2 (two) times daily as needed (eczema).   fluticasone (FLONASE) 50 MCG/ACT nasal spray Place 2 sprays into both nostrils as needed for allergies or rhinitis.   gabapentin (NEURONTIN) 300 MG capsule TAKE ONE CAPSULE BY MOUTH EVERYDAY AT BEDTIME   ipratropium (ATROVENT) 0.03 % nasal spray Place 2 sprays into both nostrils every 12 (twelve) hours.   ipratropium-albuterol (DUONEB) 0.5-2.5 (3) MG/3ML SOLN Take 3 mLs by nebulization every 6 (six) hours as needed.   isosorbide mononitrate (IMDUR) 30 MG 24 hr  tablet Take 0.5 tablets (15 mg total) by mouth daily.   LORazepam (ATIVAN) 0.5 MG tablet TAKE ONE TABLET BY MOUTH twice daily AS NEEDED FOR ANXIETY. Do not drive 8 hours after using.   Multiple Vitamins-Minerals (MULTIVITAMIN ADULT PO) Take 1 tablet by mouth daily.   nitroGLYCERIN (NITROSTAT) 0.4 MG SL tablet Place 1 tablet (0.4 mg total) under the tongue every 5 (five) minutes as needed for chest pain (if pain does not resolve with 2 doses, call 911).    rosuvastatin (CRESTOR) 40 MG tablet Take 1 tablet (40 mg total) by mouth daily.   valACYclovir (VALTREX) 1000 MG tablet Take 2 pills twice a day for 1 day at first sign of cold sore   [DISCONTINUED] Aclidinium Bromide (TUDORZA PRESSAIR) 400 MCG/ACT AEPB Inhale 1 puff into the lungs 2 (two) times daily.   No facility-administered encounter medications on file as of 01/04/2023.   BP Readings from Last 3 Encounters:  12/14/22 128/62  09/26/22 120/70  09/21/22 118/60    Pulse Readings from Last 3 Encounters:  12/14/22 68  09/26/22 77  09/21/22 73    Lab Results  Component Value Date/Time   HGBA1C 6.2 (H) 12/14/2022 03:14 PM   HGBA1C 6.2 (H) 04/27/2011 11:59 AM   Lab Results  Component Value Date   CREATININE 0.60 12/14/2022   BUN 11 12/14/2022   GFR 84.66 03/05/2022   GFRNONAA >60 08/29/2022   GFRAA 109 07/01/2019   NA 139 12/14/2022   K 4.4 12/14/2022   CALCIUM 8.6 12/14/2022   CO2 27 12/14/2022     Future Appointments  Date Time Provider Department Center  01/09/2023  8:45 AM [facility] LAB [facility]  01/25/2023  9:20 AM Tonny Bollman, MD [facility]  06/10/2023  8:30 AM [facility] ANNUAL WELLNESS VISIT 1 [facility]   [name], [facility] Clinical Pharmacist Assistant [phone]

## 2023-01-09 ENCOUNTER — Other Ambulatory Visit (INDEPENDENT_AMBULATORY_CARE_PROVIDER_SITE_OTHER): Payer: Medicare HMO

## 2023-01-09 DIAGNOSIS — E785 Hyperlipidemia, unspecified: Secondary | ICD-10-CM | POA: Diagnosis not present

## 2023-01-09 DIAGNOSIS — D649 Anemia, unspecified: Secondary | ICD-10-CM | POA: Diagnosis not present

## 2023-01-09 LAB — CBC WITH DIFFERENTIAL/PLATELET
Basophils Absolute: 0 10*3/uL (ref 0.0–0.1)
Basophils Relative: 0.6 % (ref 0.0–3.0)
Eosinophils Absolute: 0 10*3/uL (ref 0.0–0.7)
Eosinophils Relative: 1 % (ref 0.0–5.0)
HCT: 33.3 % — ABNORMAL LOW (ref 36.0–46.0)
Hemoglobin: 10.7 g/dL — ABNORMAL LOW (ref 12.0–15.0)
Lymphocytes Relative: 21.7 % (ref 12.0–46.0)
Lymphs Abs: 0.9 10*3/uL (ref 0.7–4.0)
MCHC: 32 g/dL (ref 30.0–36.0)
MCV: 87.6 fl (ref 78.0–100.0)
Monocytes Absolute: 0.4 10*3/uL (ref 0.1–1.0)
Monocytes Relative: 9 % (ref 3.0–12.0)
Neutro Abs: 2.7 10*3/uL (ref 1.4–7.7)
Neutrophils Relative %: 67.7 % (ref 43.0–77.0)
Platelets: 152 10*3/uL (ref 150.0–400.0)
RBC: 3.81 Mil/uL — ABNORMAL LOW (ref 3.87–5.11)
RDW: 15.7 % — ABNORMAL HIGH (ref 11.5–15.5)
WBC: 4 10*3/uL (ref 4.0–10.5)

## 2023-01-09 LAB — T4, FREE: Free T4: 0.75 ng/dL (ref 0.60–1.60)

## 2023-01-09 LAB — T3, FREE: T3, Free: 2.6 pg/mL (ref 2.3–4.2)

## 2023-01-09 LAB — TSH: TSH: 0.46 u[IU]/mL (ref 0.35–5.50)

## 2023-01-17 ENCOUNTER — Other Ambulatory Visit: Payer: Self-pay

## 2023-01-17 DIAGNOSIS — H25812 Combined forms of age-related cataract, left eye: Secondary | ICD-10-CM | POA: Diagnosis not present

## 2023-01-17 DIAGNOSIS — D649 Anemia, unspecified: Secondary | ICD-10-CM

## 2023-01-24 DIAGNOSIS — H2512 Age-related nuclear cataract, left eye: Secondary | ICD-10-CM | POA: Diagnosis not present

## 2023-01-25 ENCOUNTER — Encounter: Payer: Self-pay | Admitting: Cardiovascular Disease

## 2023-01-25 ENCOUNTER — Ambulatory Visit: Payer: Medicare HMO | Attending: Cardiovascular Disease | Admitting: Cardiovascular Disease

## 2023-01-25 VITALS — BP 126/64 | HR 62 | Ht 62.0 in | Wt 125.0 lb

## 2023-01-25 DIAGNOSIS — I700 Atherosclerosis of aorta: Secondary | ICD-10-CM

## 2023-01-25 DIAGNOSIS — Z952 Presence of prosthetic heart valve: Secondary | ICD-10-CM | POA: Diagnosis not present

## 2023-01-25 DIAGNOSIS — E785 Hyperlipidemia, unspecified: Secondary | ICD-10-CM

## 2023-01-25 DIAGNOSIS — I2510 Atherosclerotic heart disease of native coronary artery without angina pectoris: Secondary | ICD-10-CM | POA: Diagnosis not present

## 2023-01-25 MED ORDER — AMLODIPINE BESYLATE 2.5 MG PO TABS: 2.5000 mg | ORAL_TABLET | Freq: Every day | ORAL | 3 refills | Status: DC

## 2023-01-25 NOTE — Patient Instructions (Signed)
Medication Instructions:  Your physician has recommended you make the following change in your medication:  1-DECREASE Amlodipine 2.5 mg by mouth daily.  *If you need a refill on your cardiac medications before your next appointment, please call your pharmacy*   Lab Work: If you have labs (blood work) drawn today and your tests are completely normal, you will receive your results only by: MyChart Message (if you have MyChart) OR A paper copy in the mail If you have any lab test that is abnormal or we need to change your treatment, we will call you to review the results.  Follow-Up: At [facility], you and your health needs are our priority.  As part of our continuing mission to provide you with exceptional heart care, we have created designated Provider Care Teams.  These Care Teams include your primary Cardiologist (physician) and Advanced Practice Providers (APPs -  Physician Assistants and Nurse Practitioners) who all work together to provide you with the care you need, when you need it.  We recommend signing up for the patient portal called "MyChart".  Sign up information is provided on this After Visit Summary.  MyChart is used to connect with patients for Virtual Visits (Telemedicine).  Patients are able to view lab/test results, encounter notes, upcoming appointments, etc.  Non-urgent messages can be sent to your provider as well.   To learn more about what you can do with MyChart, go to [email or web address].    Your next appointment:   6 month(s)  Provider:   Eligha Bridegroom, NP     Then, Tonny Bollman, MD will plan to see you again in 1 year(s).

## 2023-01-25 NOTE — Progress Notes (Signed)
Cardiology Office Note:    Date:  01/25/2023   ID:  Katherine Foley, DOB [birth year], MRN [id number]  PCP:  Shelva Majestic, MD   [facility] Providers Cardiologist:  Tonny Bollman, MD Cardiology APP:  Kennon Rounds     Referring MD: Shelva Majestic, MD   Chief Complaint  Patient presents with   Coronary Artery Disease    History of Present Illness:    Katherine Foley is a 72 y.o. female with a hx of: Coronary artery disease  NSTEMI in 2011 >> BMS to RCA  S/p DES to RI in 2012 S/p CABG in 2012 (L-LAD) + AVR Cath in 9/19: RI and RCA stents ok; L-LAD atretic, mild disease in mLAD >> med Rx LHC 07/23/2022: mLAD 30; RI stent patent with 30 ISR; mLCx 25; pRCA 30, mRCA stent patent with 30 ISR, dRCA 40-med Rx Aortic valve Dz s/p bioprosthetic AVR in 2012 Echo 10/22: EF 60-65, AVR gradient 24 mmHg (? from 10 mmHg in 2019) Echo 06/11/2022: EF 60-65, no RWMA, mild reduced RVSF, s/p AVR with normal function (mean gradient 17.3 mmHg), RAP 3 Asthma/COPD  Hypertension  Hyperlipidemia  L parotidectomy (path negative) Carotid artery disease [place] 8/21: Bilateral ICA 1-39; R subclavian stenosis  The patient is here alone today.  She reports occasional chest pain but attributes this to high stress.  She has no exertional symptoms.  No dyspnea, heart palpitations, or lightheadedness.  She underwent cardiac catheterization in 2023 demonstrating no obstructive CAD.  She was reassured with these findings and has done okay from a cardiac perspective since that time.  She complains of some lower extremity edema.  Past Medical History:  Diagnosis Date   Adenomatous colon polyp    Anxiety    Anxiety/Depression 02/15/2009   Aortic stenosis s/p bioprosthetic AVR 07/07/2010   Echo 9/19:  Mild conc LVH, EF 60-65, no RWMA, Gr 1 DD, AVR ok (mean 10, peak 21), trivial MR, normal RVSF, mild TR // Echo 10/22: EF 60-65, no RWMA, normal RVSF, AVR with increased gradients (mean 24  increased from 10 in 2019) // Echocardiogram 10/23: EF 60-65, no RWMA, mildly reduced RVSF, AVR w mean gradient 17.3 mmHg   Barrett's esophagus    04/2007; 2000   Carotid stenosis 05/17/2010   a. dopplers 11/11: RICA 40-50%; LICA 0-39% (follow up due 11/12)   COPD 07/23/2007   Coronary Artery Disease 05/17/2010   NSTEMI 8/11 - BMS to RCA;  cath 4/12: dLAD 50%, RI 80% (PCI), AVCFX 30%, pRCA 30%, stent ok, 40-50, then 50% dist RCA;  c. s/p Promus DES to RI 12/25/10;  d. 04/2011 CABG x 1 (LIMA->LAD) @ time of AVR // Cath in 9/19: RI and RCA stents ok; L-LAD atretic, mild disease in mLAD >> med Rx   Family history of breast cancer    2 daughters; PALB2 mutation in family   Genetic testing 02/10/2018   PALB2 analysis at [facility] - Negative for familial mutation   GERD    H/O hiatal hernia    Hemorrhoid thrombosis    "had it lanced" (03/25/2013)   Herpes simplex without mention of complication 01/16/2010   History of blood transfusion    'w/OHS" (03/25/2013)   Hyperlipidemia 08/29/2009   Hypertension 04/14/2007   Migraine HAs 04/20/2008   Myocardial infarction [facility]) 2011   "during catherization" (03/25/2013)   OSTEOPENIA 04/14/2007   Ovarian cyst    Parotid mass    left   Pneumonia    "  multiple times" (03/25/2013)   Urine, incontinence, stress female     Past Surgical History:  Procedure Laterality Date   AORTIC VALVE REPLACEMENT  2012   APPENDECTOMY     BLEPHAROPLASTY Bilateral ~ 1998   CARDIAC CATHETERIZATION N/A 07/06/2016   Procedure: Left Heart Cath and Cors/Grafts Angiography;  Surgeon: Tonny Bollman, MD;  Location: [facility] INVASIVE CV LAB;  Service: Cardiovascular;  Laterality: N/A;   CHOLECYSTECTOMY N/A 03/25/2013   Procedure: LAPAROSCOPIC CHOLECYSTECTOMY WITH INTRAOPERATIVE CHOLANGIOGRAM;  Surgeon: Wilmon Arms. Corliss Skains, MD;  Location: [facility] OR;  Service: General;  Laterality: N/A;   CORONARY ANGIOGRAPHY N/A 05/26/2018   Procedure: CORONARY ANGIOGRAPHY (CATH LAB);  Surgeon: Corky Crafts, MD;  Location[facility] INVASIVE CV LAB;  Service: Cardiovascular;  Laterality: N/A;   CORONARY ANGIOPLASTY WITH STENT PLACEMENT  2010   "I have 2 stents" (03/25/2013)   NASAL SINUS SURGERY  1997; 1998   "cust in maxillary sinus; put a stent in then removed it" (03/25/2013)   ORIF ANKLE FRACTURE Right 02/14/2021   Procedure: OPEN TREATMENT OF RIGHT TRIMALLEOLAR ANKLE FRACTURE WITHOUT POSTERIOR FIXATION, SYNDESMOSIS;  Surgeon: Terance Hart, MD;  Location: [facility];  Service: Orthopedics;  Laterality: Right;   OVARIAN CYST REMOVAL Right 2011   "size of a soccer ball" (03/25/2013)   PAROTIDECTOMY Left 04/28/2019   Procedure: LEFT TOTAL PAROTIDECTOMY;  Surgeon: Newman Pies, MD;  Location: [facility];  Service: ENT;  Laterality: Left;   RIGHT HEART CATH AND CORONARY ANGIOGRAPHY N/A 07/23/2022   Procedure: RIGHT HEART CATH AND CORONARY ANGIOGRAPHY;  Surgeon: Kathleene Hazel, MD;  Location: [facility] INVASIVE CV LAB;  Service: Cardiovascular;  Laterality: N/A;   thrombosed hemorrohid lanced     TONSILLECTOMY AND ADENOIDECTOMY     TOTAL ABDOMINAL HYSTERECTOMY      Current Medications: Current Meds  Medication Sig   acetaminophen (TYLENOL) 650 MG CR tablet Take 1,300 mg by mouth every 8 (eight) hours as needed for pain.   albuterol (VENTOLIN HFA) 108 (90 Base) MCG/ACT inhaler Inhale 2 puffs into the lungs every 6 (six) hours as needed for wheezing or shortness of breath.   aspirin EC 81 MG tablet Take 1 tablet (81 mg total) by mouth daily.   Budeson-Glycopyrrol-Formoterol (BREZTRI AEROSPHERE) 160-9-4.8 MCG/ACT AERO Inhale 2 puffs into the lungs in the morning and at bedtime.   buPROPion (WELLBUTRIN XL) 300 MG 24 hr tablet Take 1 tablet (300 mg total) by mouth daily.   Calcium Carb-Cholecalciferol (CALCIUM 500/D PO) Take 2 tablets by mouth daily. Mag and zinc added   cetirizine (ZYRTEC) 10 MG tablet Take 10 mg by mouth at bedtime.   desvenlafaxine (PRISTIQ) 100 MG 24  hr tablet Take 1 tablet (100 mg total) by mouth every morning.   dexlansoprazole (DEXILANT) 60 MG capsule TAKE ONE CAPSULE BY MOUTH ONCE DAILY (Patient taking differently: Take 60 mg by mouth daily.)   ezetimibe (ZETIA) 10 MG tablet TAKE ONE TABLET BY MOUTH ONCE DAILY   fluticasone (CUTIVATE) 0.05 % cream Apply 1 Application topically 2 (two) times daily as needed (eczema).   fluticasone (FLONASE) 50 MCG/ACT nasal spray Place 2 sprays into both nostrils as needed for allergies or rhinitis.   gabapentin (NEURONTIN) 300 MG capsule TAKE ONE CAPSULE BY MOUTH EVERYDAY AT BEDTIME   ipratropium (ATROVENT) 0.03 % nasal spray Place 2 sprays into both nostrils every 12 (twelve) hours.   ipratropium-albuterol (DUONEB) 0.5-2.5 (3) MG/3ML SOLN Take 3 mLs by nebulization every 6 (six) hours as  needed.   isosorbide mononitrate (IMDUR) 30 MG 24 hr tablet Take 0.5 tablets (15 mg total) by mouth daily.   LORazepam (ATIVAN) 0.5 MG tablet TAKE ONE TABLET BY MOUTH twice daily AS NEEDED FOR ANXIETY. Do not drive 8 hours after using.   Multiple Vitamins-Minerals (MULTIVITAMIN ADULT PO) Take 1 tablet by mouth daily.   nitroGLYCERIN (NITROSTAT) 0.4 MG SL tablet Place 1 tablet (0.4 mg total) under the tongue every 5 (five) minutes as needed for chest pain (if pain does not resolve with 2 doses, call 911).   rosuvastatin (CRESTOR) 40 MG tablet Take 1 tablet (40 mg total) by mouth daily.   valACYclovir (VALTREX) 1000 MG tablet Take 2 pills twice a day for 1 day at first sign of cold sore   [DISCONTINUED] amLODipine (NORVASC) 5 MG tablet TAKE ONE TABLET BY MOUTH ONCE DAILY     Allergies:   Codeine sulfate and Hydrocodone-acetaminophen   Social History   Socioeconomic History   Marital status: Single    Spouse name: Not on file   Number of children: 3   Years of education: Not on file   Highest education level: Not on file  Occupational History   Occupation: Holiday representative: [employer]   Occupation: retired   Tobacco Use   Smoking status: Former    Packs/day: 1.50    Years: 44.00    Additional pack years: 0.00    Total pack years: 66.00    Types: Cigarettes    Quit date: 09/03/2005    Years since quitting: 17.4   Smokeless tobacco: Never  Vaping Use   Vaping Use: Never used  Substance and Sexual Activity   Alcohol use: Not Currently    Alcohol/week: 0.0 standard drinks of alcohol    Comment: social   Drug use: No   Sexual activity: Not Currently    Birth control/protection: Post-menopausal, Surgical  Other Topics Concern   Not on file  Social History Narrative   Lost fiancee of 22 years in 2022. 3 children (2 living). 6 grandkids. 2 greatgrandkids. Keeps greatgrandson on tuesdays- loves her so much.       Retired from [employer] (parttime- Dec 12)   Faith is very important to her      Hobbies: reading, cross stich, sew. Wants to learn to knit.       Social Determinants of Health   Financial Resource Strain: Low Risk  (06/07/2022)   Overall Financial Resource Strain (CARDIA)    Difficulty of Paying Living Expenses: Not very hard  Food Insecurity: No Food Insecurity (09/04/2022)   Hunger Vital Sign    Worried About Running Out of Food in the Last Year: Never true    Ran Out of Food in the Last Year: Never true  Transportation Needs: No Transportation Needs (09/04/2022)   PRAPARE - Administrator, Civil Service (Medical): No    Lack of Transportation (Non-Medical): No  Physical Activity: Sufficiently Active (06/07/2022)   Exercise Vital Sign    Days of Exercise per Week: 5 days    Minutes of Exercise per Session: 30 min  Stress: Stress Concern Present (06/07/2022)   [employer] of Occupational Health - Occupational Stress Questionnaire    Feeling of Stress : To some extent  Social Connections: Moderately Isolated (06/07/2022)   Social Connection and Isolation Panel [NHANES]    Frequency of Communication with Friends and Family: Once a week    Frequency of Social  Gatherings with Friends and Family:  More than three times a week    Attends Religious Services: More than 4 times per year    Active Member of Clubs or Organizations: No    Attends Banker Meetings: Never    Marital Status: Never married     Family History: The patient's family history includes Asthma in her son; Breast cancer in her daughter; Breast cancer (age of onset: 52) in her daughter; COPD in her father; Cancer in an other family member; Cirrhosis in her sister; Coronary artery disease in an other family member; Heart disease in her father, maternal uncle, mother, and another family member; Rheum arthritis in her father; Stroke in her brother. There is no history of Colon cancer or Colon polyps.  ROS:   Please see the history of present illness.    All other systems reviewed and are negative.  EKGs/Labs/Other Studies Reviewed:    The following studies were reviewed today: Cardiac Studies & Procedures   CARDIAC CATHETERIZATION  CARDIAC CATHETERIZATION 07/23/2022  Narrative   Mid LAD lesion is 30% stenosed.   Mid Cx lesion is 25% stenosed.   Prox RCA to Mid RCA lesion is 30% stenosed.   Prox RCA lesion is 30% stenosed.   Dist RCA lesion is 40% stenosed.   Ost Ramus to Ramus lesion is 30% stenosed.  Mild plaque in the ostium of the left main Mild plaque in the mid LAD Mild plaque in the mid Circumflex Patent intermediate branch stent with minimal restenosis Large dominant RCA with patent mid vessel stent with minimal restenosis. Normal right heart pressures  Recommendations: Continue medical management of CAD.  Findings Coronary Findings Diagnostic  Dominance: Right  Left Anterior Descending Mid LAD lesion is 30% stenosed.  Ramus Intermedius Vessel is large. Ost Ramus to Ramus lesion is 30% stenosed. The lesion was previously treated using a stent (unknown type) over 2 years ago.  Left Circumflex Mid Cx lesion is 25% stenosed.  Right Coronary  Artery Prox RCA lesion is 30% stenosed. Prox RCA to Mid RCA lesion is 30% stenosed. The lesion was previously treated using a stent (unknown type) over 2 years ago. Dist RCA lesion is 40% stenosed.  Intervention  No interventions have been documented.   CARDIAC CATHETERIZATION  CARDIAC CATHETERIZATION 05/26/2018  Narrative  Prox RCA to Mid RCA lesion is 25% stenosed.  Ost RCA to Prox RCA lesion is 30% stenosed.  Dist RCA lesion is 25% stenosed.  Previously placed Ramus stent (unknown type) is widely patent.  Mid Cx lesion is 25% stenosed.  Mid LAD lesion is 30% stenosed.  Patent stents.  No significnat change from 2017 angiogram.  Continue aggressive secondary prevention.  Findings Coronary Findings Diagnostic  Dominance: Right  Left Anterior Descending Mid LAD lesion is 30% stenosed.  Ramus Intermedius Vessel is large. Previously placed Ramus stent (unknown type) is widely patent.  Left Circumflex Mid Cx lesion is 25% stenosed.  Right Coronary Artery Ost RCA to Prox RCA lesion is 30% stenosed. Prox RCA to Mid RCA lesion is 25% stenosed. The lesion was previously treated. Dist RCA lesion is 25% stenosed.  Intervention  No interventions have been documented.   STRESS TESTS  MYOCARDIAL PERFUSION IMAGING 12/02/2015  Narrative  The left ventricular ejection fraction is hyperdynamic (>65%).  Nuclear stress EF: 70%.  There was no ST segment deviation noted during stress.  The study is normal.  This is a low risk study.  Normal pharmacologic nuclear stress test with no evidence of prior infarct or  ischemia.   ECHOCARDIOGRAM  ECHOCARDIOGRAM COMPLETE 06/11/2022  Narrative ECHOCARDIOGRAM REPORT    Patient Name:   MARTIZA RASK Damaso Date of Exam: 06/11/2022 Medical Rec #:  [id number]            Height:       62.5 in Accession #:    [id number]           Weight:       126.4 lb Date of Birth:  [birth year]             BSA:          1.582 m Patient Age:     71 years             BP:           120/82 mmHg Patient Gender: F                    HR:           66 bpm. Exam Location:  [address]  Procedure: 2D Echo, Cardiac Doppler and Color Doppler  Indications:    Z95.2 Aortic valve replacement  History:        Patient has prior history of Echocardiogram examinations, most recent 06/12/2021. CAD and Previous Myocardial Infarction, Prior CABG, COPD; Risk Factors:Hypertension, Dyslipidemia and Former Smoker. Aortic stenosis. Aortic atherosclerosis. Aortic Valve: bioprosthetic valve is present in the aortic position.  Sonographer:    Cathie Beams RCS Referring Phys: [id number] Evern Bio WEAVER  IMPRESSIONS   1. Left ventricular ejection fraction, by estimation, is 60 to 65%. The left ventricle has normal function. The left ventricle has no regional wall motion abnormalities. Left ventricular diastolic parameters are indeterminate. 2. Right ventricular systolic function is mildly reduced. The right ventricular size is normal. 3. The mitral valve is normal in structure. No evidence of mitral valve regurgitation. No evidence of mitral stenosis. 4. The aortic valve has been repaired/replaced. Aortic valve regurgitation is not visualized. No aortic stenosis is present. There is a bioprosthetic valve present in the aortic position. 5. The inferior Katherine cava is normal in size with greater than 50% respiratory variability, suggesting right atrial pressure of 3 mmHg.  FINDINGS Left Ventricle: Left ventricular ejection fraction, by estimation, is 60 to 65%. The left ventricle has normal function. The left ventricle has no regional wall motion abnormalities. The left ventricular internal cavity size was normal in size. There is no left ventricular hypertrophy. Left ventricular diastolic parameters are indeterminate.  Right Ventricle: The right ventricular size is normal. No increase in right ventricular wall thickness. Right ventricular systolic function is  mildly reduced.  Left Atrium: Left atrial size was normal in size.  Right Atrium: Right atrial size was normal in size.  Pericardium: There is no evidence of pericardial effusion.  Mitral Valve: The mitral valve is normal in structure. No evidence of mitral valve regurgitation. No evidence of mitral valve stenosis.  Tricuspid Valve: The tricuspid valve is normal in structure. Tricuspid valve regurgitation is not demonstrated. No evidence of tricuspid stenosis.  Aortic Valve: The aortic valve has been repaired/replaced. Aortic valve regurgitation is not visualized. No aortic stenosis is present. Aortic valve mean gradient measures 17.3 mmHg. Aortic valve peak gradient measures 32.0 mmHg. There is a bioprosthetic valve present in the aortic position.  Pulmonic Valve: The pulmonic valve was normal in structure. Pulmonic valve regurgitation is not visualized. No evidence of pulmonic stenosis.  Aorta: The aortic root is normal in size and structure.  Venous: The inferior Katherine cava is normal in size with greater than 50% respiratory variability, suggesting right atrial pressure of 3 mmHg.  IAS/Shunts: No atrial level shunt detected by color flow Doppler.   LEFT VENTRICLE PLAX 2D LVIDd:         4.60 cm Diastology LVIDs:         2.40 cm LV e' medial:    7.83 cm/s LV PW:         0.90 cm LV E/e' medial:  11.1 LV IVS:        0.90 cm LV e' lateral:   7.28 cm/s LV E/e' lateral: 12.0   RIGHT VENTRICLE RV Basal diam:  2.70 cm RV S prime:     7.65 cm/s TAPSE (M-mode): 1.4 cm  LEFT ATRIUM             Index        RIGHT ATRIUM          Index LA diam:        3.70 cm 2.34 cm/m   RA Area:     9.46 cm LA Vol (A2C):   35.0 ml 22.12 ml/m  RA Volume:   16.80 ml 10.62 ml/m LA Vol (A4C):   28.9 ml 18.26 ml/m LA Biplane Vol: 32.0 ml 20.22 ml/m AORTIC VALVE AV Vmax:           282.67 cm/s AV Vmean:          191.667 cm/s AV VTI:            0.681 m AV Peak Grad:      32.0 mmHg AV Mean Grad:       17.3 mmHg LVOT Vmax:         126.00 cm/s LVOT Vmean:        84.300 cm/s LVOT VTI:          0.295 m LVOT/AV VTI ratio: 0.43  MITRAL VALVE MV Area (PHT): 2.83 cm    SHUNTS MV Decel Time: 268 msec    Systemic VTI: 0.30 m MV E velocity: 87.00 cm/s MV A velocity: 88.00 cm/s MV E/A ratio:  0.99  Kardie Tobb DO Electronically signed by Thomasene Ripple DO Signature Date/Time: 06/11/2022/6:24:12 PM    Final              EKG:  EKG is not ordered today.    Recent Labs: 07/11/2022: NT-Pro BNP 341 08/28/2022: B Natriuretic Peptide 114.7 12/14/2022: ALT 23; BUN 11; Creat 0.60; Potassium 4.4; Sodium 139 01/09/2023: Hemoglobin 10.7; Platelets 152.0; TSH 0.46  Recent Lipid Panel    Component Value Date/Time   CHOL 112 03/05/2022 0912   CHOL 131 12/27/2020 1542   TRIG 86.0 03/05/2022 0912   HDL 51.20 03/05/2022 0912   HDL 68 12/27/2020 1542   CHOLHDL 2 03/05/2022 0912   VLDL 17.2 03/05/2022 0912   LDLCALC 44 03/05/2022 0912   LDLCALC 47 12/27/2020 1542   LDLDIRECT 65.0 06/19/2017 0901     Risk Assessment/Calculations:                Physical Exam:    VS:  BP 126/64   Pulse 62   Ht 5\' 2"  (1.575 m)   Wt 125 lb (56.7 kg)   SpO2 94%   BMI 22.86 kg/m     Wt Readings from Last 3 Encounters:  01/25/23 125 lb (56.7 kg)  12/14/22 127 lb 9.6 oz (57.9 kg)  09/26/22 126 lb 12.8 oz (57.5 kg)  GEN: Well nourished, well developed in no acute distress HEENT: Normal NECK: No JVD; No carotid bruits LYMPHATICS: No lymphadenopathy CARDIAC: RRR, 2/6 ejection murmur at the right upper sternal border RESPIRATORY:  Clear to auscultation without rales, wheezing or rhonchi  ABDOMEN: Soft, non-tender, non-distended MUSCULOSKELETAL:  No edema; No deformity  SKIN: Warm and dry NEUROLOGIC:  Alert and oriented x 3 PSYCHIATRIC:  Normal affect   ASSESSMENT:    1. S/P AVR (aortic valve replacement)   2. Aortic atherosclerosis (HCC)   3. Coronary artery disease involving native coronary  artery of native heart without angina pectoris   4. Hyperlipidemia LDL goal <70    PLAN:    In order of problems listed above:  Recent echo reviewed. LVEF is normal.  The transaortic gradient measured 17 mmHg with a peak gradient of 32 mmHg.  Repeat echo next year recommended.  Otherwise continue observation. Continue statin drug and aspirin for antiplatelet therapy Stable without recurrent angina.  Recent cath reviewed with no obstructive CAD Continue rosuvastatin 40 mg daily.  Continue ezetimibe 10 mg daily.  Last lipids with a cholesterol 112 and LDL 44. Blood pressure well-controlled.  Reduce amlodipine to 2.5 g daily to help with lower extremity edema.     Medication Adjustments/Labs and Tests Ordered: Current medicines are reviewed at length with the patient today.  Concerns regarding medicines are outlined above.  No orders of the defined types were placed in this encounter.  Meds ordered this encounter  Medications   amLODipine (NORVASC) 2.5 MG tablet    Sig: Take 1 tablet (2.5 mg total) by mouth daily.    Dispense:  90 tablet    Refill:  3    Patient Instructions  Medication Instructions:  Your physician has recommended you make the following change in your medication:  1-DECREASE Amlodipine 2.5 mg by mouth daily.  *If you need a refill on your cardiac medications before your next appointment, please call your pharmacy*   Lab Work: If you have labs (blood work) drawn today and your tests are completely normal, you will receive your results only by: MyChart Message (if you have MyChart) OR A paper copy in the mail If you have any lab test that is abnormal or we need to change your treatment, we will call you to review the results.  Follow-Up: At [facility], you and your health needs are our priority.  As part of our continuing mission to provide you with exceptional heart care, we have created designated Provider Care Teams.  These Care Teams include your  primary Cardiologist (physician) and Advanced Practice Providers (APPs -  Physician Assistants and Nurse Practitioners) who all work together to provide you with the care you need, when you need it.  We recommend signing up for the patient portal called "MyChart".  Sign up information is provided on this After Visit Summary.  MyChart is used to connect with patients for Virtual Visits (Telemedicine).  Patients are able to view lab/test results, encounter notes, upcoming appointments, etc.  Non-urgent messages can be sent to your provider as well.   To learn more about what you can do with MyChart, go to [email or web address].    Your next appointment:   6 month(s)  Provider:   Eligha Bridegroom, NP     Then, Tonny Bollman, MD will plan to see you again in 1 year(s).      Signed, Tonny Bollman, MD  01/25/2023 6:06 PM    [facility]

## 2023-02-03 ENCOUNTER — Other Ambulatory Visit: Payer: Self-pay | Admitting: Physician Assistant

## 2023-02-03 ENCOUNTER — Other Ambulatory Visit: Payer: Self-pay | Admitting: Family Medicine

## 2023-02-05 ENCOUNTER — Telehealth: Payer: Self-pay | Admitting: Pharmacist

## 2023-02-05 NOTE — Progress Notes (Signed)
Care Management & Coordination Services Pharmacy Team  Reason for Encounter: Medication coordination and delivery  Contacted patient to discuss medications and coordinate delivery from [facility]. Spoke with patient on 02/05/2023  Cycle dispensing form sent to [facility] for review.   Last adherence delivery date: 01/16/2023      Patient is due for next adherence delivery on: 02/15/2023  This delivery to include: Vials  30 Days  Gabapentin 300 mg Take one capsule by mouth everyday at bedtime Ezetimibe 10 mg Tab Take one tab by mouth once daily Dexlansoprazole 60 mg Cap Take one capsule once daily Desvenlafaxine 100 mg-Take one tab by mouth once daily Amlodipine 2.5 mg once daily Rosuvastatin 40 mg- Take one tab by mouth once daily Bupropion HCL XL 300 mg - once daily Lorazepam 0.5 mg twice daily as needed Isosorbide 30 mg 1/2 tablet once daily  No refill request needed.  Confirmed delivery date of 02/15/2023, advised patient that pharmacy will contact them the morning of delivery.   Any concerns about your medications? No  How often do you forget or accidentally miss a dose? Rarely  Is patient in packaging No  If yes  What is the date on your next pill pack?  Any concerns or issues with your packaging?   Chart review: Recent office visits:  None  Recent consult visits:  01/25/2023 [facility] (Cardiology) Tonny Bollman, MD; Reduce amlodipine to 2.5 g daily to help with lower extremity edema.   Hospital visits:  None in previous 6 months  Medications: Outpatient Encounter Medications as of 02/05/2023  Medication Sig   acetaminophen (TYLENOL) 650 MG CR tablet Take 1,300 mg by mouth every 8 (eight) hours as needed for pain.   albuterol (VENTOLIN HFA) 108 (90 Base) MCG/ACT inhaler Inhale 2 puffs into the lungs every 6 (six) hours as needed for wheezing or shortness of breath.   amLODipine (NORVASC) 2.5 MG tablet Take 1 tablet (2.5 mg total) by mouth daily.   aspirin EC  81 MG tablet Take 1 tablet (81 mg total) by mouth daily.   Budeson-Glycopyrrol-Formoterol (BREZTRI AEROSPHERE) 160-9-4.8 MCG/ACT AERO Inhale 2 puffs into the lungs in the morning and at bedtime.   buPROPion (WELLBUTRIN XL) 300 MG 24 hr tablet Take 1 tablet (300 mg total) by mouth daily.   Calcium Carb-Cholecalciferol (CALCIUM 500/D PO) Take 2 tablets by mouth daily. Mag and zinc added   cetirizine (ZYRTEC) 10 MG tablet Take 10 mg by mouth at bedtime.   desvenlafaxine (PRISTIQ) 100 MG 24 hr tablet Take 1 tablet (100 mg total) by mouth every morning.   dexlansoprazole (DEXILANT) 60 MG capsule TAKE ONE CAPSULE BY MOUTH ONCE DAILY (Patient taking differently: Take 60 mg by mouth daily.)   ezetimibe (ZETIA) 10 MG tablet TAKE ONE TABLET BY MOUTH ONCE DAILY   fluticasone (CUTIVATE) 0.05 % cream Apply 1 Application topically 2 (two) times daily as needed (eczema).   fluticasone (FLONASE) 50 MCG/ACT nasal spray Place 2 sprays into both nostrils as needed for allergies or rhinitis.   gabapentin (NEURONTIN) 300 MG capsule TAKE ONE CAPSULE BY MOUTH EVERYDAY AT BEDTIME   ipratropium (ATROVENT) 0.03 % nasal spray Place 2 sprays into both nostrils every 12 (twelve) hours.   ipratropium-albuterol (DUONEB) 0.5-2.5 (3) MG/3ML SOLN Take 3 mLs by nebulization every 6 (six) hours as needed.   isosorbide mononitrate (IMDUR) 30 MG 24 hr tablet Take 0.5 tablets (15 mg total) by mouth daily.   LORazepam (ATIVAN) 0.5 MG tablet TAKE ONE TABLET BY  MOUTH twice daily AS NEEDED FOR ANXIETY. Do not drive 8 hours after using.   Multiple Vitamins-Minerals (MULTIVITAMIN ADULT PO) Take 1 tablet by mouth daily.   nitroGLYCERIN (NITROSTAT) 0.4 MG SL tablet Place 1 tablet (0.4 mg total) under the tongue every 5 (five) minutes as needed for chest pain (if pain does not resolve with 2 doses, call 911).   rosuvastatin (CRESTOR) 40 MG tablet TAKE ONE TABLET BY MOUTH ONCE DAILY   valACYclovir (VALTREX) 1000 MG tablet Take 2 pills twice a day  for 1 day at first sign of cold sore   [DISCONTINUED] Aclidinium Bromide (TUDORZA PRESSAIR) 400 MCG/ACT AEPB Inhale 1 puff into the lungs 2 (two) times daily.   No facility-administered encounter medications on file as of 02/05/2023.   BP Readings from Last 3 Encounters:  01/25/23 126/64  12/14/22 128/62  09/26/22 120/70    Pulse Readings from Last 3 Encounters:  01/25/23 62  12/14/22 68  09/26/22 77    Lab Results  Component Value Date/Time   HGBA1C 6.2 (H) 12/14/2022 03:14 PM   HGBA1C 6.2 (H) 04/27/2011 11:59 AM   Lab Results  Component Value Date   CREATININE 0.60 12/14/2022   BUN 11 12/14/2022   GFR 84.66 03/05/2022   GFRNONAA >60 08/29/2022   GFRAA 109 07/01/2019   NA 139 12/14/2022   K 4.4 12/14/2022   CALCIUM 8.6 12/14/2022   CO2 27 12/14/2022     Future Appointments  Date Time Provider Department Center  06/10/2023  8:30 AM [facility] ANNUAL WELLNESS VISIT 1 [facility]   [name], [facility] Clinical Pharmacist Assistant [phone]

## 2023-03-05 ENCOUNTER — Other Ambulatory Visit: Payer: Self-pay | Admitting: Family Medicine

## 2023-03-06 DIAGNOSIS — M25511 Pain in right shoulder: Secondary | ICD-10-CM | POA: Diagnosis not present

## 2023-03-07 NOTE — Progress Notes (Unsigned)
Patient ID: Katherine Foley, female    DOB: 1951/04/30, 72 y.o.   MRN: [id number]  Chief Complaint  Patient presents with   Fatigue    Pt c/o ongoing fatigue for a while. Pt states it could also e here depression.      HPI:      Depression/fatigue:  feels depressed, sees a therapist regularly, has friends, dtr & son and grandchildren that she sees regularly.  Taking Pristiq 100 and Wellbutrin 300 but still feeling down, depressed most days. Used to take Zoloft for years but thinks it stopped working and why she started [employer] & wellbutrin. Reports taking her meds at night and also has trouble sleeping. Works 4d per week at [employer] a, states she would like to work 3d but needs the money.    Assessment & Plan:     Subjective:    Outpatient Medications Prior to Visit  Medication Sig Dispense Refill   acetaminophen (TYLENOL) 650 MG CR tablet Take 1,300 mg by mouth every 8 (eight) hours as needed for pain.     albuterol (VENTOLIN HFA) 108 (90 Base) MCG/ACT inhaler Inhale 2 puffs into the lungs every 6 (six) hours as needed for wheezing or shortness of breath. 8 g 3   amLODipine (NORVASC) 2.5 MG tablet Take 1 tablet (2.5 mg total) by mouth daily. 90 tablet 3   aspirin EC 81 MG tablet Take 1 tablet (81 mg total) by mouth daily.     Budeson-Glycopyrrol-Formoterol (BREZTRI AEROSPHERE) 160-9-4.8 MCG/ACT AERO Inhale 2 puffs into the lungs in the morning and at bedtime. 1 each 6   buPROPion (WELLBUTRIN XL) 300 MG 24 hr tablet Take 1 tablet (300 mg total) by mouth daily. 90 tablet 3   Calcium Carb-Cholecalciferol (CALCIUM 500/D PO) Take 2 tablets by mouth daily. Mag and zinc added     cetirizine (ZYRTEC) 10 MG tablet Take 10 mg by mouth at bedtime.     desvenlafaxine (PRISTIQ) 100 MG 24 hr tablet Take 1 tablet (100 mg total) by mouth every morning. 90 tablet 2   dexlansoprazole (DEXILANT) 60 MG capsule TAKE ONE CAPSULE BY MOUTH ONCE DAILY (Patient taking differently: Take 60 mg by mouth  daily.) 90 capsule 3   ezetimibe (ZETIA) 10 MG tablet TAKE ONE TABLET BY MOUTH ONCE DAILY 90 tablet 3   fluticasone (CUTIVATE) 0.05 % cream Apply 1 Application topically 2 (two) times daily as needed (eczema).     fluticasone (FLONASE) 50 MCG/ACT nasal spray Place 2 sprays into both nostrils as needed for allergies or rhinitis. 16 g 3   gabapentin (NEURONTIN) 300 MG capsule TAKE ONE CAPSULE BY MOUTH EVERYDAY AT BEDTIME 90 capsule 3   ipratropium (ATROVENT) 0.03 % nasal spray Place 2 sprays into both nostrils every 12 (twelve) hours. 30 mL 2   ipratropium-albuterol (DUONEB) 0.5-2.5 (3) MG/3ML SOLN Take 3 mLs by nebulization every 6 (six) hours as needed. 360 mL 4   isosorbide mononitrate (IMDUR) 30 MG 24 hr tablet Take 0.5 tablets (15 mg total) by mouth daily. 45 tablet 3   LORazepam (ATIVAN) 0.5 MG tablet TAKE ONE TABLET BY MOUTH twice daily AS NEEDED FOR ANXIETY. Do not drive 8 hours after using. 60 tablet 1   Multiple Vitamins-Minerals (MULTIVITAMIN ADULT PO) Take 1 tablet by mouth daily.     nitroGLYCERIN (NITROSTAT) 0.4 MG SL tablet Place 1 tablet (0.4 mg total) under the tongue every 5 (five) minutes as needed for chest pain (if pain does not resolve with  2 doses, call 911). 30 tablet 5   rosuvastatin (CRESTOR) 40 MG tablet TAKE ONE TABLET BY MOUTH ONCE DAILY 90 tablet 2   valACYclovir (VALTREX) 1000 MG tablet Take 2 pills twice a day for 1 day at first sign of cold sore 30 tablet 1   No facility-administered medications prior to visit.   Past Medical History:  Diagnosis Date   Adenomatous colon polyp    Anxiety    Anxiety/Depression 02/15/2009   Aortic stenosis s/p bioprosthetic AVR 07/07/2010   Echo 9/19:  Mild conc LVH, EF 60-65, no RWMA, Gr 1 DD, AVR ok (mean 10, peak 21), trivial MR, normal RVSF, mild TR // Echo 10/22: EF 60-65, no RWMA, normal RVSF, AVR with increased gradients (mean 24 increased from 10 in 2019) // Echocardiogram 10/23: EF 60-65, no RWMA, mildly reduced RVSF, AVR w  mean gradient 17.3 mmHg   Barrett's esophagus    04/2007; 2000   Carotid stenosis 05/17/2010   a. dopplers 11/11: RICA 40-50%; LICA 0-39% (follow up due 11/12)   COPD 07/23/2007   Coronary Artery Disease 05/17/2010   NSTEMI 8/11 - BMS to RCA;  cath 4/12: dLAD 50%, RI 80% (PCI), AVCFX 30%, pRCA 30%, stent ok, 40-50, then 50% dist RCA;  c. s/p Promus DES to RI 12/25/10;  d. 04/2011 CABG x 1 (LIMA->LAD) @ time of AVR // Cath in 9/19: RI and RCA stents ok; L-LAD atretic, mild disease in mLAD >> med Rx   Family history of breast cancer    2 daughters; PALB2 mutation in family   Genetic testing 02/10/2018   PALB2 analysis at [facility] - Negative for familial mutation   GERD    H/O hiatal hernia    Hemorrhoid thrombosis    "had it lanced" (03/25/2013)   Herpes simplex without mention of complication 01/16/2010   History of blood transfusion    'w/OHS" (03/25/2013)   Hyperlipidemia 08/29/2009   Hypertension 04/14/2007   Migraine HAs 04/20/2008   Myocardial infarction [facility]) 2011   "during catherization" (03/25/2013)   OSTEOPENIA 04/14/2007   Ovarian cyst    Parotid mass    left   Pneumonia    "multiple times" (03/25/2013)   Urine, incontinence, stress female    Past Surgical History:  Procedure Laterality Date   AORTIC VALVE REPLACEMENT  2012   APPENDECTOMY     BLEPHAROPLASTY Bilateral ~ 1998   CARDIAC CATHETERIZATION N/A 07/06/2016   Procedure: Left Heart Cath and Cors/Grafts Angiography;  Surgeon: Tonny Bollman, MD;  Location: [facility] INVASIVE CV LAB;  Service: Cardiovascular;  Laterality: N/A;   CHOLECYSTECTOMY N/A 03/25/2013   Procedure: LAPAROSCOPIC CHOLECYSTECTOMY WITH INTRAOPERATIVE CHOLANGIOGRAM;  Surgeon: Wilmon Arms. Corliss Skains, MD;  Location: [facility] OR;  Service: General;  Laterality: N/A;   CORONARY ANGIOGRAPHY N/A 05/26/2018   Procedure: CORONARY ANGIOGRAPHY (CATH LAB);  Surgeon: Corky Crafts, MD;  Location: [facility] INVASIVE CV LAB;  Service: Cardiovascular;  Laterality: N/A;   CORONARY  ANGIOPLASTY WITH STENT PLACEMENT  2010   "I have 2 stents" (03/25/2013)   NASAL SINUS SURGERY  1997; 1998   "cust in maxillary sinus; put a stent in then removed it" (03/25/2013)   ORIF ANKLE FRACTURE Right 02/14/2021   Procedure: OPEN TREATMENT OF RIGHT TRIMALLEOLAR ANKLE FRACTURE WITHOUT POSTERIOR FIXATION, SYNDESMOSIS;  Surgeon: Terance Hart, MD;  Location: [facility];  Service: Orthopedics;  Laterality: Right;   OVARIAN CYST REMOVAL Right 2011   "size of a soccer ball" (03/25/2013)   PAROTIDECTOMY Left 04/28/2019  Procedure: LEFT TOTAL PAROTIDECTOMY;  Surgeon: Newman Pies, MD;  Location: [facility];  Service: ENT;  Laterality: Left;   RIGHT HEART CATH AND CORONARY ANGIOGRAPHY N/A 07/23/2022   Procedure: RIGHT HEART CATH AND CORONARY ANGIOGRAPHY;  Surgeon: Kathleene Hazel, MD;  Location: [facility] INVASIVE CV LAB;  Service: Cardiovascular;  Laterality: N/A;   thrombosed hemorrohid lanced     TONSILLECTOMY AND ADENOIDECTOMY     TOTAL ABDOMINAL HYSTERECTOMY     Allergies  Allergen Reactions   Codeine Sulfate Itching and Rash   Hydrocodone-Acetaminophen Itching and Rash      Objective:    Physical Exam Vitals and nursing note reviewed.  Constitutional:      Appearance: Normal appearance.  Cardiovascular:     Rate and Rhythm: Normal rate and regular rhythm.  Pulmonary:     Effort: Pulmonary effort is normal.     Breath sounds: Normal breath sounds.  Musculoskeletal:        General: Normal range of motion.  Skin:    General: Skin is warm and dry.  Neurological:     Mental Status: She is alert.  Psychiatric:        Mood and Affect: Mood normal.        Behavior: Behavior normal.    BP (!) 143/75   Pulse 70   Temp (!) 97.3 F (36.3 C) (Temporal)   Ht 5\' 2"  (1.575 m)   Wt 123 lb (55.8 kg)   SpO2 94%   BMI 22.50 kg/m  Wt Readings from Last 3 Encounters:  03/08/23 123 lb (55.8 kg)  01/25/23 125 lb (56.7 kg)  12/14/22 127 lb 9.6 oz (57.9  kg)       Dulce Sellar, NP

## 2023-03-08 ENCOUNTER — Ambulatory Visit (INDEPENDENT_AMBULATORY_CARE_PROVIDER_SITE_OTHER): Payer: Medicare HMO | Admitting: Family

## 2023-03-08 VITALS — BP 143/75 | HR 70 | Temp 97.3°F | Ht 62.0 in | Wt 123.0 lb

## 2023-03-08 DIAGNOSIS — F3341 Major depressive disorder, recurrent, in partial remission: Secondary | ICD-10-CM | POA: Diagnosis not present

## 2023-03-09 NOTE — Assessment & Plan Note (Addendum)
PHQ 16 taking Pristiq 100 Wellbutrin 300 taking both at bedtime, difficulty sleeping  advised to switch to taking in am starting tomorrow encouraged pt to continue individual & group therapy, to reach out to friends, family, stay engaged f/u w/PCP if still no difference in sx after another 1-2w

## 2023-03-13 DIAGNOSIS — R0982 Postnasal drip: Secondary | ICD-10-CM | POA: Diagnosis not present

## 2023-03-13 DIAGNOSIS — J310 Chronic rhinitis: Secondary | ICD-10-CM | POA: Diagnosis not present

## 2023-03-13 DIAGNOSIS — J342 Deviated nasal septum: Secondary | ICD-10-CM | POA: Diagnosis not present

## 2023-03-13 DIAGNOSIS — J343 Hypertrophy of nasal turbinates: Secondary | ICD-10-CM | POA: Diagnosis not present

## 2023-04-03 DIAGNOSIS — M25511 Pain in right shoulder: Secondary | ICD-10-CM | POA: Diagnosis not present

## 2023-04-05 ENCOUNTER — Ambulatory Visit: Payer: Medicare HMO | Admitting: Family

## 2023-04-21 ENCOUNTER — Ambulatory Visit (HOSPITAL_COMMUNITY)
Admission: EM | Admit: 2023-04-21 | Discharge: 2023-04-21 | Disposition: A | Discharge status: Home / Self Care | Payer: Medicare HMO | Attending: Family Medicine | Admitting: Family Medicine

## 2023-04-21 ENCOUNTER — Other Ambulatory Visit: Payer: Self-pay

## 2023-04-21 ENCOUNTER — Encounter (HOSPITAL_COMMUNITY): Payer: Self-pay | Admitting: *Deleted

## 2023-04-21 DIAGNOSIS — K146 Glossodynia: Secondary | ICD-10-CM

## 2023-04-21 DIAGNOSIS — R030 Elevated blood-pressure reading, without diagnosis of hypertension: Secondary | ICD-10-CM | POA: Diagnosis not present

## 2023-04-21 DIAGNOSIS — K148 Other diseases of tongue: Secondary | ICD-10-CM | POA: Diagnosis not present

## 2023-04-21 MED ORDER — DOXYCYCLINE HYCLATE 100 MG PO CAPS: 100.0000 mg | ORAL_CAPSULE | Freq: Two times a day (BID) | ORAL | 0 refills | Status: DC

## 2023-04-21 MED ORDER — DOXYCYCLINE HYCLATE 100 MG PO CAPS: 100.0000 mg | ORAL_CAPSULE | Freq: Two times a day (BID) | ORAL | 0 refills | Status: AC

## 2023-04-21 MED ORDER — LIDOCAINE VISCOUS HCL 2 % MT SOLN: 5.0000 mL | Freq: Three times a day (TID) | OROMUCOSAL | 0 refills | Status: DC | PRN

## 2023-04-21 NOTE — ED Provider Notes (Signed)
[facility]    CSN: [id number] Arrival date & time: 04/21/23  1342      History   Chief Complaint Chief Complaint  Patient presents with   Mouth Lesions    HPI Katherine Foley is a 72 y.o. female.    Mouth Lesions The patient presents with pain underneath her tongue x 2 months. Pain is worse with eating. Her pain is about 10/10 in severity. She has a denture in place. No other symptoms.   Past Medical History:  Diagnosis Date   Adenomatous colon polyp    Anxiety    Anxiety/Depression 02/15/2009   Aortic stenosis s/p bioprosthetic AVR 07/07/2010   Echo 9/19:  Mild conc LVH, EF 60-65, no RWMA, Gr 1 DD, AVR ok (mean 10, peak 21), trivial MR, normal RVSF, mild TR // Echo 10/22: EF 60-65, no RWMA, normal RVSF, AVR with increased gradients (mean 24 increased from 10 in 2019) // Echocardiogram 10/23: EF 60-65, no RWMA, mildly reduced RVSF, AVR w mean gradient 17.3 mmHg   Barrett's esophagus    04/2007; 2000   Carotid stenosis 05/17/2010   a. dopplers 11/11: RICA 40-50%; LICA 0-39% (follow up due 11/12)   COPD 07/23/2007   Coronary Artery Disease 05/17/2010   NSTEMI 8/11 - BMS to RCA;  cath 4/12: dLAD 50%, RI 80% (PCI), AVCFX 30%, pRCA 30%, stent ok, 40-50, then 50% dist RCA;  c. s/p Promus DES to RI 12/25/10;  d. 04/2011 CABG x 1 (LIMA->LAD) @ time of AVR // Cath in 9/19: RI and RCA stents ok; L-LAD atretic, mild disease in mLAD >> med Rx   Family history of breast cancer    2 daughters; PALB2 mutation in family   Genetic testing 02/10/2018   PALB2 analysis at [facility] - Negative for familial mutation   GERD    H/O hiatal hernia    Hemorrhoid thrombosis    "had it lanced" (03/25/2013)   Herpes simplex without mention of complication 01/16/2010   History of blood transfusion    'w/OHS" (03/25/2013)   Hyperlipidemia 08/29/2009   Hypertension 04/14/2007   Migraine HAs 04/20/2008   Myocardial infarction [facility]) 2011   "during catherization" (03/25/2013)   OSTEOPENIA  04/14/2007   Ovarian cyst    Parotid mass    left   Pneumonia    "multiple times" (03/25/2013)   Urine, incontinence, stress female     Patient Active Problem List   Diagnosis Date Noted   Recurrent major depressive disorder, in partial remission (HCC) 09/06/2022   Influenza A 08/28/2022   COVID-19 07/31/2022   Hypokalemia 07/31/2022   Anemia 07/31/2022   Coronary artery disease involving native coronary artery of native heart without angina pectoris 07/23/2022   Aortic atherosclerosis (HCC) 03/10/2020   Restless legs 08/25/2019   H/O parotidectomy 04/28/2019   Genetic testing 02/10/2018   Family history of breast cancer    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   Hot flashes 03/22/2016   Allergic rhinitis 09/22/2015   Depression 09/22/2015   Former smoker 09/22/2015   Adenomatous colon polyp    Diastolic dysfunction 05/31/2015   GERD (gastroesophageal reflux disease) 06/04/2014   S/P AVR (aortic valve replacement) 11/06/2013   Precordial chest pain 11/06/2013   Aortic stenosis 07/07/2010   Coronary artery disease involving native coronary artery with angina pectoris (HCC) 05/17/2010   Carotid stenosis 05/17/2010   Herpes simplex virus (HSV) infection 01/16/2010   HLD (hyperlipidemia) 08/29/2009   COPD (chronic obstructive pulmonary disease) (HCC) 07/23/2007  Essential hypertension 04/14/2007   Osteopenia 04/14/2007    Past Surgical History:  Procedure Laterality Date   AORTIC VALVE REPLACEMENT  2012   APPENDECTOMY     BLEPHAROPLASTY Bilateral ~ 1998   CARDIAC CATHETERIZATION N/A 07/06/2016   Procedure: Left Heart Cath and Cors/Grafts Angiography;  Surgeon: Tonny Bollman, MD;  Location: [facility] INVASIVE CV LAB;  Service: Cardiovascular;  Laterality: N/A;   CHOLECYSTECTOMY N/A 03/25/2013   Procedure: LAPAROSCOPIC CHOLECYSTECTOMY WITH INTRAOPERATIVE CHOLANGIOGRAM;  Surgeon: Wilmon Arms. Corliss Skains, MD;  Location: [facility];  Service: General;  Laterality: N/A;   CORONARY ANGIOGRAPHY N/A  05/26/2018   Procedure: CORONARY ANGIOGRAPHY (CATH LAB);  Surgeon: Corky Crafts, MD;  Location: [facility] INVASIVE CV LAB;  Service: Cardiovascular;  Laterality: N/A;   CORONARY ANGIOPLASTY WITH STENT PLACEMENT  2010   "I have 2 stents" (03/25/2013)   NASAL SINUS SURGERY  1997; 1998   "cust in maxillary sinus; put a stent in then removed it" (03/25/2013)   ORIF ANKLE FRACTURE Right 02/14/2021   Procedure: OPEN TREATMENT OF RIGHT TRIMALLEOLAR ANKLE FRACTURE WITHOUT POSTERIOR FIXATION, SYNDESMOSIS;  Surgeon: Terance Hart, MD;  Location: [facility];  Service: Orthopedics;  Laterality: Right;   OVARIAN CYST REMOVAL Right 2011   "size of a soccer ball" (03/25/2013)   PAROTIDECTOMY Left 04/28/2019   Procedure: LEFT TOTAL PAROTIDECTOMY;  Surgeon: Newman Pies, MD;  Location: [facility];  Service: ENT;  Laterality: Left;   RIGHT HEART CATH AND CORONARY ANGIOGRAPHY N/A 07/23/2022   Procedure: RIGHT HEART CATH AND CORONARY ANGIOGRAPHY;  Surgeon: Kathleene Hazel, MD;  Location: [facility];  Service: Cardiovascular;  Laterality: N/A;   thrombosed hemorrohid lanced     TONSILLECTOMY AND ADENOIDECTOMY     TOTAL ABDOMINAL HYSTERECTOMY      OB History   No obstetric history on file.      Home Medications    Prior to Admission medications   Medication Sig Start Date End Date Taking? Authorizing Provider  acetaminophen (TYLENOL) 650 MG CR tablet Take 1,300 mg by mouth every 8 (eight) hours as needed for pain.   Yes [provider]  albuterol (VENTOLIN HFA) 108 (90 Base) MCG/ACT inhaler Inhale 2 puffs into the lungs every 6 (six) hours as needed for wheezing or shortness of breath. 12/14/21  Yes Shelva Majestic, MD  amLODipine (NORVASC) 2.5 MG tablet Take 1 tablet (2.5 mg total) by mouth daily. 01/25/23  Yes Tonny Bollman, MD  aspirin EC 81 MG tablet Take 1 tablet (81 mg total) by mouth daily. 06/17/19  Yes Weaver, Scott T, PA-C   Budeson-Glycopyrrol-Formoterol (BREZTRI AEROSPHERE) 160-9-4.8 MCG/ACT AERO Inhale 2 puffs into the lungs in the morning and at bedtime. 09/04/22  Yes Lupita Leash, MD  buPROPion (WELLBUTRIN XL) 300 MG 24 hr tablet Take 1 tablet (300 mg total) by mouth daily. 10/10/22  Yes Shelva Majestic, MD  Calcium Carb-Cholecalciferol (CALCIUM 500/D PO) Take 2 tablets by mouth daily. Mag and zinc added   Yes [provider]  cetirizine (ZYRTEC) 10 MG tablet Take 10 mg by mouth at bedtime.   Yes [provider]  desvenlafaxine (PRISTIQ) 100 MG 24 hr tablet Take 1 tablet (100 mg total) by mouth every morning. 11/08/22  Yes Shelva Majestic, MD  dexlansoprazole (DEXILANT) 60 MG capsule TAKE ONE CAPSULE BY MOUTH ONCE DAILY Patient taking differently: Take 60 mg by mouth daily. 08/07/22  Yes Shelva Majestic, MD  doxycycline (VIBRAMYCIN) 100 MG capsule Take  1 capsule (100 mg total) by mouth 2 (two) times daily for 5 days. 04/21/23 04/26/23 Yes Doreene Eland, MD  ezetimibe (ZETIA) 10 MG tablet TAKE ONE TABLET BY MOUTH ONCE DAILY 02/04/23  Yes Tonny Bollman, MD  fluticasone (CUTIVATE) 0.05 % cream Apply 1 Application topically 2 (two) times daily as needed (eczema).   Yes [provider]  fluticasone (FLONASE) 50 MCG/ACT nasal spray Place 2 sprays into both nostrils as needed for allergies or rhinitis. 11/22/21  Yes Shelva Majestic, MD  gabapentin (NEURONTIN) 300 MG capsule TAKE ONE CAPSULE BY MOUTH EVERYDAY AT BEDTIME 11/08/22  Yes Shelva Majestic, MD  ipratropium (ATROVENT) 0.03 % nasal spray Place 2 sprays into both nostrils every 12 (twelve) hours. 09/21/22  Yes Glenford Bayley, NP  ipratropium-albuterol (DUONEB) 0.5-2.5 (3) MG/3ML SOLN Take 3 mLs by nebulization every 6 (six) hours as needed. 08/31/22  Yes Lupita Leash, MD  isosorbide mononitrate (IMDUR) 30 MG 24 hr tablet Take 0.5 tablets (15 mg total) by mouth daily. 07/11/22  Yes Weaver, Scott T, PA-C  LORazepam (ATIVAN)  0.5 MG tablet TAKE ONE TABLET BY MOUTH twice daily AS NEEDED FOR ANXIETY. Do not drive 8 hours after using. 03/05/23  Yes Shelva Majestic, MD  magic mouthwash (lidocaine, diphenhydrAMINE, alum & mag hydroxide) suspension Swish and spit 5 mLs 3 (three) times daily as needed for mouth pain. 04/21/23  Yes Doreene Eland, MD  Multiple Vitamins-Minerals (MULTIVITAMIN ADULT PO) Take 1 tablet by mouth daily.   Yes [provider]  nitroGLYCERIN (NITROSTAT) 0.4 MG SL tablet Place 1 tablet (0.4 mg total) under the tongue every 5 (five) minutes as needed for chest pain (if pain does not resolve with 2 doses, call 911). 07/11/22  Yes Weaver, Scott T, PA-C  rosuvastatin (CRESTOR) 40 MG tablet TAKE ONE TABLET BY MOUTH ONCE DAILY 02/04/23  Yes Shelva Majestic, MD  valACYclovir (VALTREX) 1000 MG tablet Take 2 pills twice a day for 1 day at first sign of cold sore 12/14/22  Yes Shelva Majestic, MD  Aclidinium Bromide (TUDORZA PRESSAIR) 400 MCG/ACT AEPB Inhale 1 puff into the lungs 2 (two) times daily. 09/11/11 06/16/18  Stacie Glaze, MD    Family History Family History  Problem Relation Age of Onset   Coronary artery disease Other    Heart disease Other        6 stents   Heart disease Mother    COPD Father        smoked   Heart disease Father    Rheum arthritis Father    Heart disease Maternal Uncle    Breast cancer Daughter        currently 54   Asthma Son        as a child    Breast cancer Daughter 48       currently 34; PALB2 mutation   Cancer Other        very distant paternal cousin; pancreatic ca   Cirrhosis Sister    Stroke Brother    Colon cancer Neg Hx    Colon polyps Neg Hx     Social History Social History   Tobacco Use   Smoking status: Former    Current packs/day: 0.00    Average packs/day: 1.5 packs/day for 44.0 years (66.0 ttl pk-yrs)    Types: Cigarettes    Start date: 09/03/1961    Quit date: 09/03/2005    Years since quitting: 17.6   Smokeless tobacco:  Never   Vaping Use   Vaping status: Never Used  Substance Use Topics   Alcohol use: Not Currently    Alcohol/week: 0.0 standard drinks of alcohol    Comment: social   Drug use: No     Allergies   Codeine sulfate and Hydrocodone-acetaminophen   Review of Systems Review of Systems  HENT:  Positive for mouth sores.   All other systems reviewed and are negative.    Physical Exam Triage Vital Signs ED Triage Vitals  Encounter Vitals Group     BP 04/21/23 1445 (!) 146/78     Systolic BP Percentile --      Diastolic BP Percentile --      Pulse Rate 04/21/23 1445 67     Resp 04/21/23 1445 18     Temp 04/21/23 1445 97.7 F (36.5 C)     Temp src --      SpO2 04/21/23 1445 96 %     Weight --      Height --      Head Circumference --      Peak Flow --      Pain Score 04/21/23 1442 8     Pain Loc --      Pain Education --      Exclude from Growth Chart --    No data found.  Updated Vital Signs BP (!) 146/78   Pulse 67   Temp 97.7 F (36.5 C)   Resp 18   SpO2 96%   Visual Acuity Right Eye Distance:   Left Eye Distance:   Bilateral Distance:    Right Eye Near:   Left Eye Near:    Bilateral Near:     Physical Exam Vitals and nursing note reviewed.  Constitutional:      Appearance: Normal appearance.  HENT:     Mouth/Throat:     Comments: Shallow, slightly erythematous ulcer at the base of the right side of her tongue. Cardiovascular:     Rate and Rhythm: Normal rate and regular rhythm.     Heart sounds: Normal heart sounds.  Pulmonary:     Effort: Pulmonary effort is normal.     Breath sounds: Normal breath sounds. No wheezing.      UC Treatments / Results  Labs (all labs ordered are listed, but only abnormal results are displayed) Labs Reviewed - No data to display  EKG   Radiology No results found.  Procedures Procedures (including critical care time)  Medications Ordered in UC Medications - No data to display  Initial Impression /  Assessment and Plan / UC Course  I have reviewed the triage vital signs and the nursing notes.  Pertinent labs & imaging results that were available during my care of the patient were reviewed by me and considered in my medical decision making (see chart for details).   Final Clinical Impressions(s) / UC Diagnoses   Final diagnoses:  Tongue lesion  Tongue pain  Elevated blood pressure reading     Discharge Instructions      It was nice seeing you.  You do have an inflamed ulcer at the base of your tongue. Use magic mouth wash prn pain and start antibiotic as ordered. I however want you to see your ENT/Dr Christain Sacramento this coming week for potential biopsy since this has been ongoing x 2 months. Follow up with [place] as needed     ED Prescriptions     Medication Sig Dispense Auth. Provider   magic mouthwash (  lidocaine, diphenhydrAMINE, alum & mag hydroxide) suspension Swish and spit 5 mLs 3 (three) times daily as needed for mouth pain. 360 mL Janit Pagan T, MD   doxycycline (VIBRAMYCIN) 100 MG capsule  (Status: Discontinued) Take 1 capsule (100 mg total) by mouth 2 (two) times daily for 10 days. 20 capsule Janit Pagan T, MD   doxycycline (VIBRAMYCIN) 100 MG capsule Take 1 capsule (100 mg total) by mouth 2 (two) times daily for 5 days. 10 capsule Doreene Eland, MD      PDMP not reviewed this encounter.   Doreene Eland, MD 04/21/23 1515

## 2023-04-21 NOTE — Discharge Instructions (Signed)
It was nice seeing you.  You do have an inflamed ulcer at the base of your tongue. Use magic mouth wash prn pain and start antibiotic as ordered. I however want you to see your ENT/Dr Christain Sacramento this coming week for potential biopsy since this has been ongoing x 2 months. Follow up with [place] as needed

## 2023-04-21 NOTE — ED Triage Notes (Signed)
Pt reports a sore under the tongue on the Rt and on the gum next to it. Pt reports sore has been there fo r2 months.

## 2023-04-22 ENCOUNTER — Encounter: Payer: Self-pay | Admitting: Family Medicine

## 2023-04-22 NOTE — Telephone Encounter (Signed)
See below

## 2023-04-29 DIAGNOSIS — M25511 Pain in right shoulder: Secondary | ICD-10-CM | POA: Diagnosis not present

## 2023-04-30 ENCOUNTER — Other Ambulatory Visit: Payer: Self-pay | Admitting: Family Medicine

## 2023-05-01 DIAGNOSIS — F4381 Prolonged grief disorder: Secondary | ICD-10-CM | POA: Diagnosis not present

## 2023-05-03 ENCOUNTER — Other Ambulatory Visit: Payer: Self-pay | Admitting: Physician Assistant

## 2023-05-04 DIAGNOSIS — M25511 Pain in right shoulder: Secondary | ICD-10-CM | POA: Diagnosis not present

## 2023-05-20 DIAGNOSIS — M25511 Pain in right shoulder: Secondary | ICD-10-CM | POA: Diagnosis not present

## 2023-05-24 ENCOUNTER — Telehealth: Payer: Self-pay | Admitting: Cardiovascular Disease

## 2023-05-24 NOTE — Telephone Encounter (Signed)
Pre-operative Risk Assessment    Patient Name: Katherine Foley  DOB: 31-Jan-1951 MRN: [id number]      Request for Surgical Clearance    Procedure:   Right reverse total shoulder arthroplasty  Date of Surgery:  Clearance - TBD                                 Surgeon:  Dr. Claretha Cooper Group or Practice Name:  [facility]  Phone number:  [phone]  Fax number:  [phone]   Type of Clearance Requested:   - Medical  - Pharmacy:  Hold        Type of Anesthesia:   Choice   Additional requests/questions:   Kristen Cardinal stated patient will need medical and pharmacy clearance.   Signed, Annetta Maw   05/24/2023, 10:48 AM

## 2023-05-27 ENCOUNTER — Telehealth: Payer: Self-pay

## 2023-05-27 NOTE — Telephone Encounter (Signed)
Called and scheduled patient with Dr. Jimmey Ralph on 05/28/23 2 10:40 am for surgical clearance.

## 2023-05-27 NOTE — Telephone Encounter (Signed)
Please schedule pt surgery clearance ov with available provider, request received from [facility].

## 2023-05-27 NOTE — Telephone Encounter (Signed)
Name: Katherine Foley  DOB: 1951-04-24  MRN: [id number]  Primary Cardiologist: Tonny Bollman, MD  Chart reviewed as part of pre-operative protocol coverage. Because of Katherine Foley's past medical history and time since last visit, she will require a follow-up in-office visit in order to better assess preoperative cardiovascular risk.  Patient has an office visit scheduled on 07/30/2023 with Tereso Newcomer, PA-C. Appointment notes have been updated to reflect need for pre-op evaluation.   Pre-op covering staff:  - Please contact requesting surgeon's office via preferred method (i.e, phone, fax) to inform them of need for appointment prior to surgery.   Carlos Levering, NP  05/27/2023, 11:37 AM

## 2023-05-27 NOTE — Telephone Encounter (Signed)
FYI, Surgical clearance forms are on my desk.

## 2023-05-28 ENCOUNTER — Ambulatory Visit (INDEPENDENT_AMBULATORY_CARE_PROVIDER_SITE_OTHER): Payer: Medicare HMO | Admitting: Family Medicine

## 2023-05-28 ENCOUNTER — Encounter: Payer: Self-pay | Admitting: Family Medicine

## 2023-05-28 VITALS — BP 120/62 | HR 72 | Temp 97.8°F | Ht 62.0 in | Wt 126.0 lb

## 2023-05-28 DIAGNOSIS — I25119 Atherosclerotic heart disease of native coronary artery with unspecified angina pectoris: Secondary | ICD-10-CM | POA: Diagnosis not present

## 2023-05-28 DIAGNOSIS — I10 Essential (primary) hypertension: Secondary | ICD-10-CM

## 2023-05-28 DIAGNOSIS — F3341 Major depressive disorder, recurrent, in partial remission: Secondary | ICD-10-CM | POA: Diagnosis not present

## 2023-05-28 DIAGNOSIS — J449 Chronic obstructive pulmonary disease, unspecified: Secondary | ICD-10-CM | POA: Diagnosis not present

## 2023-05-28 DIAGNOSIS — Z01818 Encounter for other preprocedural examination: Secondary | ICD-10-CM

## 2023-05-28 LAB — BASIC METABOLIC PANEL
BUN: 11 mg/dL (ref 6–23)
CO2: 31 mEq/L (ref 19–32)
Calcium: 8.9 mg/dL (ref 8.4–10.5)
Chloride: 101 mEq/L (ref 96–112)
Creatinine, Ser: 0.65 mg/dL (ref 0.40–1.20)
GFR: 86.91 mL/min (ref >60.00)
Glucose, Bld: 103 mg/dL — ABNORMAL HIGH (ref 70–99)
Potassium: 4 mEq/L (ref 3.5–5.1)
Sodium: 138 mEq/L (ref 135–145)

## 2023-05-28 LAB — CBC
HCT: 37.6 % (ref 36.0–46.0)
Hemoglobin: 12 g/dL (ref 12.0–15.0)
MCHC: 31.9 g/dL (ref 30.0–36.0)
MCV: 92.6 fl (ref 78.0–100.0)
Platelets: 168 10*3/uL (ref 150.0–400.0)
RBC: 4.06 Mil/uL (ref 3.87–5.11)
RDW: 15.5 % (ref 11.5–15.5)
WBC: 4.7 10*3/uL (ref 4.0–10.5)

## 2023-05-28 LAB — HEMOGLOBIN A1C: Hgb A1c MFr Bld: 6.5 % (ref 4.6–6.5)

## 2023-05-28 MED ORDER — LORAZEPAM 0.5 MG PO TABS: ORAL_TABLET | ORAL | 1 refills | Status: DC

## 2023-05-28 NOTE — Addendum Note (Signed)
Addended by: Shelva Majestic on: 05/28/2023 11:59 AM   Modules accepted: Orders

## 2023-05-28 NOTE — Telephone Encounter (Signed)
Form received Waiting for lab results

## 2023-05-28 NOTE — Patient Instructions (Signed)
It was very nice to see you today!  We will check blood work today.  If this is all normal we will fax a clearance letter to your surgeons office.  No follow-ups on file.   Take care, Dr Jimmey Ralph  PLEASE NOTE:  If you had any lab tests, please let us know if you have not heard back within a few days. You may see your results on mychart before we have a chance to review them but we will give you a call once they are reviewed by [place].   If we ordered any referrals today, please let us know if you have not heard from their office within the next week.   If you had any urgent prescriptions sent in today, please check with the pharmacy within an hour of our visit to make sure the prescription was transmitted appropriately.   Please try these tips to maintain a healthy lifestyle:  Eat at least 3 REAL meals and 1-2 snacks per day.  Aim for no more than 5 hours between eating.  If you eat breakfast, please do so within one hour of getting up.   Each meal should contain half fruits/vegetables, one quarter protein, and one quarter carbs (no bigger than a computer mouse)  Cut down on sweet beverages. This includes juice, soda, and sweet tea.   Drink at least 1 glass of water with each meal and aim for at least 8 glasses per day  Exercise at least 150 minutes every week.

## 2023-05-28 NOTE — Progress Notes (Signed)
   Katherine Foley is a 72 y.o. female who presents today for an office visit.  Assessment/Plan:  New/Acute Problems: Encounter for Surgical Clearance Will complete surgical clearance form from medical standpoint and check requested labs today.  Her cardiac clearance is pending.  Assuming labs are normal will be cleared from a medical standpoint.  Right Shoulder Pain Following with Orthopedics and has upcoming right shoulder replacement.  We completed the surgical clearance form as above.  Chronic Problems Addressed Today: Essential Hypertension  At goal today on amlodipine 2.5 mg daily and imdur 15 mg daily  COPD Stable.  No recent flares.  Follows with pulmonology.  She is on Breztri daily and albuterol as needed.  CAD Following with cardiology.  Currently on aspirin 81 mg daily, Crestor 40 mg daily, Zetia 10 mg daily, Imdur 15 mg daily.  Tolerating his regimen well.  Cardiac clearance is pending as above.  Anxiety/depression Patient under little more stress and recently she is upcoming on the 5-year anniversary of her daughter's passing.  She is currently prescribed Wellbutrin 300 mg daily, Pristiq 100 mg daily, and Ativan 0.5 mg twice daily as needed.  Overall these are managing reasonably well though does need a refill on Ativan today.  Will forward this to her PCP.        Subjective:  HPI:  Patient here today for surgical clearance at the request of Dr Ave Filter for right reverse total shoulder arthroplasty.  She has been having issues with persistent shoulder pain since fracturing her right humeral neck a couple of years ago. She has been following with orthopedics for this and they have tried managing conservatively including exercises and steroid injections. None of these helped significantly.  She is elected to go forward with shoulder replacement.  Hopefully she will be to get this done in the next several weeks.  Overall is doing well.  She does follow with cardiology  for coronary artery disease and has cardiac clearance pending.  Does not currently have any chest pain or shortness of breath.  Has excellent functional status.       Objective:  Physical Exam: BP 120/62   Pulse 72   Temp 97.8 F (36.6 C) (Temporal)   Ht 5\' 2"  (1.575 m)   Wt 126 lb (57.2 kg)   SpO2 98%   BMI 23.05 kg/m   Gen: No acute distress, resting comfortably CV: Regular rate and rhythm with no murmurs appreciated Pulm: Normal work of breathing, clear to auscultation bilaterally with no crackles, wheezes, or rhonchi Neuro: Grossly normal, moves all extremities Psych: Normal affect and thought content      Katherine Foley M. Jimmey Ralph, MD 05/28/2023 10:55 AM

## 2023-05-30 NOTE — Progress Notes (Signed)
Her A1c is in the borderline diabetic range but about the same as it was several months ago.  The rest of her labs are all stable.  We can fax over a surgical clearance form to her surgeon's office.  Recommend that she schedule appointment with me or Dr Durene Cal to discuss treatment options for her blood sugar.

## 2023-06-10 ENCOUNTER — Ambulatory Visit (INDEPENDENT_AMBULATORY_CARE_PROVIDER_SITE_OTHER): Payer: Medicare HMO

## 2023-06-10 VITALS — Wt 126.0 lb

## 2023-06-10 DIAGNOSIS — Z0000 Encounter for general adult medical examination without abnormal findings: Secondary | ICD-10-CM

## 2023-06-10 NOTE — Progress Notes (Signed)
Subjective:   Katherine Foley is a 72 y.o. female who presents for Medicare Annual (Subsequent) preventive examination.  Visit Complete: Virtual  I connected with  [name] on 06/10/23 by a audio enabled telemedicine application and verified that I am speaking with the correct person using two identifiers.  Patient Location: Home  Provider Location: Office/Clinic  I discussed the limitations of evaluation and management by telemedicine. The patient expressed understanding and agreed to proceed.  Patient Medicare AWV questionnaire was completed by the patient on 06/10/23; I have confirmed that all information answered by patient is correct and no changes since this date.  Because this visit was a virtual/telehealth visit, some criteria may be missing or patient reported. Any vitals not documented were not able to be obtained and vitals that have been documented are patient reported.    Cardiac Risk Factors include: advanced age (>88men, >44 women);dyslipidemia;hypertension     Objective:    Today's Vitals   06/10/23 0831  Weight: 126 lb (57.2 kg)   Body mass index is 23.05 kg/m.     06/10/2023    8:40 AM 07/23/2022    7:56 AM 06/07/2022    8:25 AM 05/09/2022   11:53 AM 05/27/2021   11:29 PM 05/19/2021    1:49 PM 02/14/2021    8:42 AM  Advanced Directives  Does Patient Have a Medical Advance Directive? No No Yes Yes No Yes No  Type of Best boy of [place];Living will Healthcare Power of [employer] Power of Attorney   Does patient want to make changes to medical advance directive?    No - Patient declined     Copy of Healthcare Power of Attorney in Chart?   No - copy requested No - copy requested  No - copy requested   Would patient like information on creating a medical advance directive? No - Patient declined No - Patient declined     No - Patient declined    Current Medications (verified) Outpatient Encounter  Medications as of 06/10/2023  Medication Sig   acetaminophen (TYLENOL) 650 MG CR tablet Take 1,300 mg by mouth every 8 (eight) hours as needed for pain.   albuterol (VENTOLIN HFA) 108 (90 Base) MCG/ACT inhaler Inhale 2 puffs into the lungs every 6 (six) hours as needed for wheezing or shortness of breath.   amLODipine (NORVASC) 2.5 MG tablet Take 1 tablet (2.5 mg total) by mouth daily.   aspirin EC 81 MG tablet Take 1 tablet (81 mg total) by mouth daily.   Budeson-Glycopyrrol-Formoterol (BREZTRI AEROSPHERE) 160-9-4.8 MCG/ACT AERO Inhale 2 puffs into the lungs in the morning and at bedtime.   buPROPion (WELLBUTRIN XL) 300 MG 24 hr tablet Take 1 tablet (300 mg total) by mouth daily.   Calcium Carb-Cholecalciferol (CALCIUM 500/D PO) Take 2 tablets by mouth daily. Mag and zinc added   cetirizine (ZYRTEC) 10 MG tablet Take 10 mg by mouth at bedtime.   desvenlafaxine (PRISTIQ) 100 MG 24 hr tablet Take 1 tablet (100 mg total) by mouth every morning.   dexlansoprazole (DEXILANT) 60 MG capsule TAKE ONE CAPSULE BY MOUTH ONCE DAILY (Patient taking differently: Take 60 mg by mouth daily.)   ezetimibe (ZETIA) 10 MG tablet TAKE ONE TABLET BY MOUTH ONCE DAILY   fluticasone (CUTIVATE) 0.05 % cream Apply 1 Application topically 2 (two) times daily as needed (eczema).   fluticasone (FLONASE) 50 MCG/ACT nasal spray Place 2 sprays into both nostrils as needed for allergies  or rhinitis.   gabapentin (NEURONTIN) 300 MG capsule TAKE ONE CAPSULE BY MOUTH EVERYDAY AT BEDTIME   ipratropium (ATROVENT) 0.03 % nasal spray Place 2 sprays into both nostrils every 12 (twelve) hours.   ipratropium-albuterol (DUONEB) 0.5-2.5 (3) MG/3ML SOLN Take 3 mLs by nebulization every 6 (six) hours as needed.   isosorbide mononitrate (IMDUR) 30 MG 24 hr tablet TAKE 1/2 TABLET BY MOUTH ONCE DAILY   LORazepam (ATIVAN) 0.5 MG tablet TAKE ONE TABLET BY MOUTH twice daily AS NEEDED FOR ANXIETY. Do not drive 8 hours after using.   Multiple  Vitamins-Minerals (MULTIVITAMIN ADULT PO) Take 1 tablet by mouth daily.   nitroGLYCERIN (NITROSTAT) 0.4 MG SL tablet Place 1 tablet (0.4 mg total) under the tongue every 5 (five) minutes as needed for chest pain (if pain does not resolve with 2 doses, call 911).   rosuvastatin (CRESTOR) 40 MG tablet TAKE ONE TABLET BY MOUTH ONCE DAILY   valACYclovir (VALTREX) 1000 MG tablet TAKE 2 TABLETS BY MOUTH TWICE DAILY FOR 1 DAY AT FIRST SIGN OF COLD SORE *REFILL REQUEST*   [DISCONTINUED] Aclidinium Bromide (TUDORZA PRESSAIR) 400 MCG/ACT AEPB Inhale 1 puff into the lungs 2 (two) times daily.   No facility-administered encounter medications on file as of 06/10/2023.    Allergies (verified) Codeine sulfate and Hydrocodone-acetaminophen   History: Past Medical History:  Diagnosis Date   Adenomatous colon polyp    Anxiety    Anxiety/Depression 02/15/2009   Aortic stenosis s/p bioprosthetic AVR 07/07/2010   Echo 9/19:  Mild conc LVH, EF 60-65, no RWMA, Gr 1 DD, AVR ok (mean 10, peak 21), trivial MR, normal RVSF, mild TR // Echo 10/22: EF 60-65, no RWMA, normal RVSF, AVR with increased gradients (mean 24 increased from 10 in 2019) // Echocardiogram 10/23: EF 60-65, no RWMA, mildly reduced RVSF, AVR w mean gradient 17.3 mmHg   Barrett's esophagus    04/2007; 2000   Carotid stenosis 05/17/2010   a. dopplers 11/11: RICA 40-50%; LICA 0-39% (follow up due 11/12)   COPD 07/23/2007   Coronary Artery Disease 05/17/2010   NSTEMI 8/11 - BMS to RCA;  cath 4/12: dLAD 50%, RI 80% (PCI), AVCFX 30%, pRCA 30%, stent ok, 40-50, then 50% dist RCA;  c. s/p Promus DES to RI 12/25/10;  d. 04/2011 CABG x 1 (LIMA->LAD) @ time of AVR // Cath in 9/19: RI and RCA stents ok; L-LAD atretic, mild disease in mLAD >> med Rx   Family history of breast cancer    2 daughters; PALB2 mutation in family   Genetic testing 02/10/2018   PALB2 analysis at [facility] - Negative for familial mutation   GERD    H/O hiatal hernia    Hemorrhoid  thrombosis    "had it lanced" (03/25/2013)   Herpes simplex without mention of complication 01/16/2010   History of blood transfusion    'w/OHS" (03/25/2013)   Hyperlipidemia 08/29/2009   Hypertension 04/14/2007   Migraine HAs 04/20/2008   Myocardial infarction [facility]) 2011   "during catherization" (03/25/2013)   OSTEOPENIA 04/14/2007   Ovarian cyst    Parotid mass    left   Pneumonia    "multiple times" (03/25/2013)   Urine, incontinence, stress female    Past Surgical History:  Procedure Laterality Date   AORTIC VALVE REPLACEMENT  2012   APPENDECTOMY     BLEPHAROPLASTY Bilateral ~ 1998   CARDIAC CATHETERIZATION N/A 07/06/2016   Procedure: Left Heart Cath and Cors/Grafts Angiography;  Surgeon: Tonny Bollman, MD;  Location:  [facility] INVASIVE CV [facility];  Service: Cardiovascular;  Laterality: N/A;   CHOLECYSTECTOMY N/A 03/25/2013   Procedure: LAPAROSCOPIC CHOLECYSTECTOMY WITH INTRAOPERATIVE CHOLANGIOGRAM;  Surgeon: Wilmon Arms. Corliss Skains, MD;  Location: [facility] OR;  Service: General;  Laterality: N/A;   CORONARY ANGIOGRAPHY N/A 05/26/2018   Procedure: CORONARY ANGIOGRAPHY (CATH LAB);  Surgeon: Corky Crafts, MD;  [facility];  Service: Cardiovascular;  Laterality: N/A;   CORONARY ANGIOPLASTY WITH STENT PLACEMENT  2010   "I have 2 stents" (03/25/2013)   NASAL SINUS SURGERY  1997; 1998   "cust in maxillary sinus; put a stent in then removed it" (03/25/2013)   ORIF ANKLE FRACTURE Right 02/14/2021   Procedure: OPEN TREATMENT OF RIGHT TRIMALLEOLAR ANKLE FRACTURE WITHOUT POSTERIOR FIXATION, SYNDESMOSIS;  Surgeon: Terance Hart, MD;  Location: [facility];  Service: Orthopedics;  Laterality: Right;   OVARIAN CYST REMOVAL Right 2011   "size of a soccer ball" (03/25/2013)   PAROTIDECTOMY Left 04/28/2019   Procedure: LEFT TOTAL PAROTIDECTOMY;  Surgeon: Newman Pies, MD;  Location: [facility];  Service: ENT;  Laterality: Left;   RIGHT HEART CATH AND CORONARY  ANGIOGRAPHY N/A 07/23/2022   Procedure: RIGHT HEART CATH AND CORONARY ANGIOGRAPHY;  Surgeon: Kathleene Hazel, MD;  Location: [facility];  Service: Cardiovascular;  Laterality: N/A;   thrombosed hemorrohid lanced     TONSILLECTOMY AND ADENOIDECTOMY     TOTAL ABDOMINAL HYSTERECTOMY     Family History  Problem Relation Age of Onset   Coronary artery disease Other    Heart disease Other        6 stents   Heart disease Mother    COPD Father        smoked   Heart disease Father    Rheum arthritis Father    Heart disease Maternal Uncle    Breast cancer Daughter        currently 59   Asthma Son        as a child    Breast cancer Daughter 55       currently 57; PALB2 mutation   Cancer Other        very distant paternal cousin; pancreatic ca   Cirrhosis Sister    Stroke Brother    Colon cancer Neg Hx    Colon polyps Neg Hx    Social History   Socioeconomic History   Marital status: Single    Spouse name: Not on file   Number of children: 3   Years of education: Not on file   Highest education level: Not on file  Occupational History   Occupation: Holiday representative: [employer]   Occupation: retired  Tobacco Use   Smoking status: Former    Current packs/day: 0.00    Average packs/day: 1.5 packs/day for 44.0 years (66.0 ttl pk-yrs)    Types: Cigarettes    Start date: 09/03/1961    Quit date: 09/03/2005    Years since quitting: 17.7   Smokeless tobacco: Never  Vaping Use   Vaping status: Never Used  Substance and Sexual Activity   Alcohol use: Not Currently    Alcohol/week: 0.0 standard drinks of alcohol    Comment: social   Drug use: No   Sexual activity: Not Currently    Birth control/protection: Post-menopausal, Surgical  Other Topics Concern   Not on file  Social History Narrative   Lost fiancee of 22 years in 2022. 3 children (2 living). 6 grandkids. 2 greatgrandkids.  Keeps greatgrandson on tuesdays- loves her so much.       Retired from [employer]  (parttime- Dec 12)   Faith is very important to her      Hobbies: reading, cross stich, sew. Wants to learn to knit.       Social Determinants of Health   Financial Resource Strain: High Risk (06/10/2023)   Overall Financial Resource Strain (CARDIA)    Difficulty of Paying Living Expenses: Very hard  Food Insecurity: Food Insecurity Present (06/10/2023)   Hunger Vital Sign    Worried About Running Out of Food in the Last Year: Sometimes true    Ran Out of Food in the Last Year: Never true  Transportation Needs: No Transportation Needs (06/10/2023)   PRAPARE - Administrator, Civil Service (Medical): No    Lack of Transportation (Non-Medical): No  Physical Activity: Sufficiently Active (06/10/2023)   Exercise Vital Sign    Days of Exercise per Week: 7 days    Minutes of Exercise per Session: 30 min  Stress: Stress Concern Present (06/10/2023)   [employer] of Occupational Health - Occupational Stress Questionnaire    Feeling of Stress : Very much  Social Connections: Moderately Integrated (06/10/2023)   Social Connection and Isolation Panel [NHANES]    Frequency of Communication with Friends and Family: Three times a week    Frequency of Social Gatherings with Friends and Family: Once a week    Attends Religious Services: More than 4 times per year    Active Member of [employer] or Organizations: Yes    Attends Engineer, structural: More than 4 times per year    Marital Status: Divorced    Tobacco Counseling Counseling given: Not Answered   Clinical Intake:  Pre-visit preparation completed: Yes  Pain : No/denies pain     BMI - recorded: 23.05 Nutritional Status: BMI of 19-24  Normal Nutritional Risks: None Diabetes: No  How often do you need to have someone help you when you read instructions, pamphlets, or other written materials from your doctor or pharmacy?: 1 - Never  Interpreter Needed?: No  Information entered by :: Lanier Ensign,  LPN   Activities of Daily Living    06/10/2023    8:27 AM  In your present state of health, do you have any difficulty performing the following activities:  Hearing? 0  Vision? 0  Difficulty concentrating or making decisions? 0  Walking or climbing stairs? 0  Dressing or bathing? 0  Doing errands, shopping? 0  Preparing Food and eating ? N  Using the Toilet? N  In the past six months, have you accidently leaked urine? Y  Do you have problems with loss of bowel control? N  Managing your Medications? N  Managing your Finances? N  Housekeeping or managing your Housekeeping? N    Patient Care Team: Shelva Majestic, MD as PCP - General (Family Medicine) Tonny Bollman, MD as PCP - Cardiology (Cardiology) Bensimhon, Bevelyn Buckles, MD (Cardiology) Nyoka Cowden, MD as Consulting Physician (Pulmonary Disease) Alan Mulder, DC (Chiropractic Medicine) Newman Pies, MD as Consulting Physician (Otolaryngology) Kennon Rounds as Physician Assistant (Cardiology) Erroll Luna, [facility] (Inactive) (Pharmacist)  Indicate any recent Medical Services you may have received from other than [facility] providers in the past year (date may be approximate).     Assessment:   This is a routine wellness examination for [place].  Hearing/Vision screen Hearing Screening - Comments:: Pt denies any hearing issues  Vision  Screening - Comments:: Pt follows up with Dr Rubye Oaks for annual eye exams    Goals Addressed   None    Depression Screen    06/10/2023    8:36 AM 05/28/2023   10:38 AM 03/08/2023    8:10 AM 12/14/2022    1:55 PM 09/06/2022    4:00 PM 06/07/2022    8:24 AM 03/05/2022    8:26 AM  PHQ 2/9 Scores  PHQ - 2 Score 2 0 6 3 2 5 5   PHQ- 9 Score 11  16 12 2 11 11     Fall Risk    06/10/2023    8:27 AM 05/28/2023   10:38 AM 06/07/2022    8:26 AM 05/19/2021    1:50 PM 10/28/2020    3:52 PM  Fall Risk   Falls in the past year? 1 0 1 1 0  Number falls in past yr: 1 0 1 1 0  Injury with Fall? 0  0 0 1 0  Comment    ankle broken   Risk for fall due to : Impaired vision No Fall Risks Impaired vision;Impaired balance/gait Impaired vision   Follow up Falls prevention discussed  Falls prevention discussed Falls prevention discussed     MEDICARE RISK AT HOME: Medicare Risk at Home Any stairs in or around the home?: Yes If so, are there any without handrails?: No Home free of loose throw rugs in walkways, pet beds, electrical cords, etc?: Yes Adequate lighting in your home to reduce risk of falls?: Yes Life alert?: No Use of a cane, walker or w/c?: No Grab bars in the bathroom?: Yes Shower chair or bench in shower?: No Elevated toilet seat or a handicapped toilet?: No  TIMED UP AND GO:  Was the test performed?  No    Cognitive Function:        06/10/2023    8:41 AM 06/07/2022    8:28 AM 05/19/2021    1:51 PM 04/21/2020    9:50 AM 04/04/2018    1:29 PM  6CIT Screen  What Year? 0 points 0 points 0 points 0 points 0 points  What month? 0 points 0 points 0 points 0 points 0 points  What time? 0 points 0 points 0 points  0 points  Count back from 20 0 points 0 points 0 points 0 points 0 points  Months in reverse 0 points 0 points 0 points 0 points 0 points  Repeat phrase 0 points 0 points 0 points 0 points   Total Score 0 points 0 points 0 points      Immunizations Immunization History  Administered Date(s) Administered   Fluad Quad(high Dose 65+) 07/14/2020   Influenza Split 06/19/2011, 07/02/2012   Influenza Whole 08/03/2009   Influenza, High Dose Seasonal PF 05/20/2015, 06/04/2019, 06/04/2019   Influenza,inj,Quad PF,6+ Mos 05/21/2013, 05/14/2014, 06/17/2018   Influenza-Unspecified 08/17/2016, 06/19/2017, 06/17/2018   PFIZER(Purple Top)SARS-COV-2 Vaccination 10/20/2019, 11/09/2019, 06/30/2020   Pneumococcal Conjugate-13 06/14/2014   Pneumococcal Polysaccharide-23 03/22/2016   Td 08/29/2009, 03/17/2020   Zoster Recombinant(Shingrix) 03/10/2018, 07/15/2018   Zoster,  Live 01/21/2016    TDAP status: Up to date  Flu Vaccine status: Due, Education has been provided regarding the importance of this vaccine. Advised may receive this vaccine at local pharmacy or Health Dept. Aware to provide a copy of the vaccination record if obtained from local pharmacy or Health Dept. Verbalized acceptance and understanding.  Pneumococcal vaccine status: Up to date  Covid-19 vaccine status: Information provided on how to obtain  vaccines.   Qualifies for Shingles Vaccine? Yes   Zostavax completed Yes   Shingrix Completed?: Yes  Screening Tests Health Maintenance  Topic Date Due   COVID-19 Vaccine (4 - 2023-24 season) 06/13/2023 (Originally 05/05/2023)   MAMMOGRAM  08/18/2023 (Originally 06/16/2022)   INFLUENZA VACCINE  12/02/2023 (Originally 04/04/2023)   Medicare Annual Wellness (AWV)  06/09/2024   Colonoscopy  07/14/2024   DTaP/Tdap/Td (3 - Tdap) 03/17/2030   Pneumonia Vaccine 80+ Years old  Completed   DEXA SCAN  Completed   Hepatitis C Screening  Completed   Zoster Vaccines- Shingrix  Completed   HPV VACCINES  Aged Out    Health Maintenance  There are no preventive care reminders to display for this patient.   Colorectal cancer screening: Type of screening: Colonoscopy. Completed 07/14/14. Repeat every 10 years  Mammogram status: Completed 08/17/22. Repeat every year  Bone Density status: Completed 03/09/20. Results reflect: Bone density results: OSTEOPENIA. Repeat every 2 years.   Additional Screening:  Hepatitis C Screening: Completed 12/21/15  Vision Screening: Recommended annual ophthalmology exams for early detection of glaucoma and other disorders of the eye. Is the patient up to date with their annual eye exam?  Yes  Who is the provider or what is the name of the office in which the patient attends annual eye exams? Dr Rubye Oaks  If pt is not established with a provider, would they like to be referred to a provider to establish care? No .   Dental  Screening: Recommended annual dental exams for proper oral hygiene   Community Resource Referral / Chronic Care Management: CRR required this visit?  No   CCM required this visit?  No     Plan:     I have personally reviewed and noted the following in the patient's chart:   Medical and social history Use of alcohol, tobacco or illicit drugs  Current medications and supplements including opioid prescriptions. Patient is not currently taking opioid prescriptions. Functional ability and status Nutritional status Physical activity Advanced directives List of other physicians Hospitalizations, surgeries, and ER visits in previous 12 months Vitals Screenings to include cognitive, depression, and falls Referrals and appointments  In addition, I have reviewed and discussed with patient certain preventive protocols, quality metrics, and best practice recommendations. A written personalized care plan for preventive services as well as general preventive health recommendations were provided to patient.     Marzella Schlein, LPN   [id number]   After Visit Summary: (MyChart) Due to this being a telephonic visit, the after visit summary with patients personalized plan was offered to patient via MyChart   Nurse Notes: none

## 2023-06-10 NOTE — Patient Instructions (Addendum)
Ms. Harts , Thank you for taking time to come for your Medicare Wellness Visit. I appreciate your ongoing commitment to your health goals. Please review the following plan we discussed and let me know if I can assist you in the future.   Referrals/Orders/Follow-Ups/Clinician Recommendations: Aim for 30 minutes of exercise or brisk walking, 6-8 glasses of water, and 5 servings of fruits and vegetables each day.     This is a list of the screening recommended for you and due dates:  Health Maintenance  Topic Date Due   COVID-19 Vaccine (4 - 2023-24 season) 06/13/2023*   Mammogram  08/18/2023*   Flu Shot  12/02/2023*   Medicare Annual Wellness Visit  06/09/2024   Colon Cancer Screening  07/14/2024   DTaP/Tdap/Td vaccine (3 - Tdap) 03/17/2030   Pneumonia Vaccine  Completed   DEXA scan (bone density measurement)  Completed   Hepatitis C Screening  Completed   Zoster (Shingles) Vaccine  Completed   HPV Vaccine  Aged Out  *Topic was postponed. The date shown is not the original due date.    Advanced directives: (Declined) Advance directive discussed with you today. Even though you declined this today, please call our office should you change your mind, and we can give you the proper paperwork for you to fill out.  Next Medicare Annual Wellness Visit scheduled for next year: Yes

## 2023-06-19 DIAGNOSIS — F4381 Prolonged grief disorder: Secondary | ICD-10-CM | POA: Diagnosis not present

## 2023-06-27 ENCOUNTER — Other Ambulatory Visit: Payer: Self-pay | Admitting: Family Medicine

## 2023-06-27 ENCOUNTER — Encounter: Payer: Self-pay | Admitting: Family

## 2023-06-27 ENCOUNTER — Ambulatory Visit: Payer: Medicare HMO | Admitting: Family

## 2023-06-27 VITALS — BP 122/76 | HR 76 | Temp 97.8°F | Ht 62.0 in | Wt 126.4 lb

## 2023-06-27 DIAGNOSIS — J309 Allergic rhinitis, unspecified: Secondary | ICD-10-CM | POA: Diagnosis not present

## 2023-06-27 DIAGNOSIS — B9789 Other viral agents as the cause of diseases classified elsewhere: Secondary | ICD-10-CM

## 2023-06-27 LAB — POCT INFLUENZA A/B
Influenza A, POC: NEGATIVE
Influenza B, POC: NEGATIVE

## 2023-06-27 LAB — POC COVID19 BINAXNOW: SARS Coronavirus 2 Ag: NEGATIVE

## 2023-06-27 MED ORDER — TRIAMCINOLONE ACETONIDE 55 MCG/ACT NA AERO
1.0000 | INHALATION_SPRAY | Freq: Every day | NASAL | 2 refills | Status: DC
Start: 2023-06-27 — End: 2024-06-09

## 2023-06-27 NOTE — Progress Notes (Signed)
Patient ID: Katherine Foley, female    DOB: 24-Oct-1950, 72 y.o.   MRN: [id number]  Chief Complaint  Patient presents with  . Sinus Problem    Pt c/o of sinus concern, with drainage, runny nose, tired, body aches for 2 days. Pt has taken OTC Tylenol to help with body aches.    Discussed the use of AI scribe software for clinical note transcription with the patient, who gave verbal consent to proceed.  History of Present Illness   The patient presents with a several day history of head congestion and cough. She describes the discomfort as pressure in the head, particularly around the eyes and ears. She also reports a cough, which is not severe, and throat mucus. The mucus is described as initially creamy in color, but has since turned a brownish color, particularly noticeable in the mornings.  She denies any chest involvement or pain, but does report discomfort in the teeth and ears. 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 She has previously been prescribed Flonase , but has not been using this, and Atrovent nasal spray given by her ENT. She states she will see her ENT again in January for allergy testing.     Assessment & Plan:     Viral sinusitis - Symptoms of sinus pressure, cough, and brownish mucus production for the past few days, Most likely d/t possible microbleeds from frequent nose blowing and coughing. No fever or chest pain.  Pt has a soft lump just under & to the right of her right eye, which she has been told is a fat deposit.  -Resume steroid nasal spray daily, sending Nasacort to pharmacy. -Continue Atrovent daily for rhinorrhea. -Consider adding saline nasal spray tid prn for additional relief. -If no improvement by Monday, patient to contact office.  Influenza Vaccination Patient has not  received this year's flu shot. -Advised to get flu shot at next convenient opportunity, either at next office visit or at local pharmacy.     Subjective:    Outpatient Medications Prior to Visit  Medication Sig Dispense Refill  . acetaminophen (TYLENOL) 650 MG CR tablet Take 1,300 mg by mouth every 8 (eight) hours as needed for pain.    Marland Kitchen albuterol (VENTOLIN HFA) 108 (90 Base) MCG/ACT inhaler Inhale 2 puffs into the lungs every 6 (six) hours as needed for wheezing or shortness of breath. 8 g 3  . amLODipine (NORVASC) 2.5 MG tablet Take 1 tablet (2.5 mg total) by mouth daily. 90 tablet 3  . aspirin EC 81 MG tablet Take 1 tablet (81 mg total) by mouth daily.    . Budeson-Glycopyrrol-Formoterol (BREZTRI AEROSPHERE) 160-9-4.8 MCG/ACT AERO Inhale 2 puffs into the lungs in the morning and at bedtime. 1 each 6  . buPROPion (WELLBUTRIN XL) 300 MG 24 hr tablet Take 1 tablet (300 mg total) by mouth daily. 90 tablet 3  . Calcium Carb-Cholecalciferol (CALCIUM 500/D PO) Take 2 tablets by mouth daily. Mag and zinc added    . desvenlafaxine (PRISTIQ) 100 MG 24 hr tablet Take 1 tablet (100 mg total) by mouth every morning. 90 tablet 2  . dexlansoprazole (DEXILANT) 60 MG capsule TAKE ONE CAPSULE BY MOUTH ONCE DAILY (Patient taking differently: Take 60 mg by mouth daily.) 90 capsule 3  . ezetimibe (ZETIA) 10 MG tablet  TAKE ONE TABLET BY MOUTH ONCE DAILY 90 tablet 3  . fluticasone (CUTIVATE) 0.05 % cream Apply 1 Application topically 2 (two) times daily as needed (eczema).    . fluticasone (FLONASE) 50 MCG/ACT nasal spray Place 2 sprays into both nostrils as needed for allergies or rhinitis. 16 g 3  . gabapentin (NEURONTIN) 300 MG capsule TAKE ONE CAPSULE BY MOUTH EVERYDAY AT BEDTIME 90 capsule 3  . ipratropium (ATROVENT) 0.03 % nasal spray Place 2 sprays into both nostrils every 12 (twelve) hours. 30 mL 2  . ipratropium-albuterol (DUONEB) 0.5-2.5 (3) MG/3ML SOLN Take 3 mLs by nebulization every 6 (six) hours as  needed. 360 mL 4  . isosorbide mononitrate (IMDUR) 30 MG 24 hr tablet TAKE 1/2 TABLET BY MOUTH ONCE DAILY 45 tablet 3  . LORazepam (ATIVAN) 0.5 MG tablet TAKE ONE TABLET BY MOUTH twice daily AS NEEDED FOR ANXIETY. Do not drive 8 hours after using. 60 tablet 1  . Multiple Vitamins-Minerals (MULTIVITAMIN ADULT PO) Take 1 tablet by mouth daily.    . nitroGLYCERIN (NITROSTAT) 0.4 MG SL tablet Place 1 tablet (0.4 mg total) under the tongue every 5 (five) minutes as needed for chest pain (if pain does not resolve with 2 doses, call 911). 30 tablet 5  . rosuvastatin (CRESTOR) 40 MG tablet TAKE ONE TABLET BY MOUTH ONCE DAILY 90 tablet 2  . valACYclovir (VALTREX) 1000 MG tablet TAKE 2 TABLETS BY MOUTH TWICE DAILY FOR 1 DAY AT FIRST SIGN OF COLD SORE *REFILL REQUEST* 30 tablet 10  . cetirizine (ZYRTEC) 10 MG tablet Take 10 mg by mouth at bedtime.     No facility-administered medications prior to visit.   Past Medical History:  Diagnosis Date  . Adenomatous colon polyp   . Anxiety   . Anxiety/Depression 02/15/2009  . Aortic stenosis s/p bioprosthetic AVR 07/07/2010   Echo 9/19:  Mild conc LVH, EF 60-65, no RWMA, Gr 1 DD, AVR ok (mean 10, peak 21), trivial MR, normal RVSF, mild TR // Echo 10/22: EF 60-65, no RWMA, normal RVSF, AVR with increased gradients (mean 24 increased from 10 in 2019) // Echocardiogram 10/23: EF 60-65, no RWMA, mildly reduced RVSF, AVR w mean gradient 17.3 mmHg  . Barrett's esophagus    04/2007; 2000  . Carotid stenosis 05/17/2010   a. dopplers 11/11: RICA 40-50%; LICA 0-39% (follow up due 11/12)  . COPD 07/23/2007  . Coronary Artery Disease 05/17/2010   NSTEMI 8/11 - BMS to RCA;  cath 4/12: dLAD 50%, RI 80% (PCI), AVCFX 30%, pRCA 30%, stent ok, 40-50, then 50% dist RCA;  c. s/p Promus DES to RI 12/25/10;  d. 04/2011 CABG x 1 (LIMA->LAD) @ time of AVR // Cath in 9/19: RI and RCA stents ok; L-LAD atretic, mild disease in mLAD >> med Rx  . Family history of breast cancer    2  daughters; PALB2 mutation in family  . Genetic testing 02/10/2018   PALB2 analysis at [facility] - Negative for familial mutation  . GERD   . H/O hiatal hernia   . Hemorrhoid thrombosis    "had it lanced" (03/25/2013)  . Herpes simplex without mention of complication 01/16/2010  . History of blood transfusion    'w/OHS" (03/25/2013)  . Hyperlipidemia 08/29/2009  . Hypertension 04/14/2007  . Migraine HAs 04/20/2008  . Myocardial infarction [facility]) 2011   "during catherization" (03/25/2013)  . OSTEOPENIA 04/14/2007  . Ovarian cyst   . Parotid mass    left  . Pneumonia    "  multiple times" (03/25/2013)  . Urine, incontinence, stress female    Past Surgical History:  Procedure Laterality Date  . AORTIC VALVE REPLACEMENT  2012  . APPENDECTOMY    . BLEPHAROPLASTY Bilateral ~ 1998  . CARDIAC CATHETERIZATION N/A 07/06/2016   Procedure: Left Heart Cath and Cors/Grafts Angiography;  Surgeon: Tonny Bollman, MD;  Location: [facility] INVASIVE CV LAB;  Service: Cardiovascular;  Laterality: N/A;  . CHOLECYSTECTOMY N/A 03/25/2013   Procedure: LAPAROSCOPIC CHOLECYSTECTOMY WITH INTRAOPERATIVE CHOLANGIOGRAM;  Surgeon: Wilmon Arms. Corliss Skains, MD;  Location: [facility] OR;  Service: General;  Laterality: N/A;  . CORONARY ANGIOGRAPHY N/A 05/26/2018   Procedure: CORONARY ANGIOGRAPHY (CATH LAB);  Surgeon: Corky Crafts, MD;  Location: [facility] INVASIVE CV LAB;  Service: Cardiovascular;  Laterality: N/A;  . CORONARY ANGIOPLASTY WITH STENT PLACEMENT  2010   "I have 2 stents" (03/25/2013)  . NASAL SINUS SURGERY  1997; 1998   "cust in maxillary sinus; put a stent in then removed it" (03/25/2013)  . ORIF ANKLE FRACTURE Right 02/14/2021   Procedure: OPEN TREATMENT OF RIGHT TRIMALLEOLAR ANKLE FRACTURE WITHOUT POSTERIOR FIXATION, SYNDESMOSIS;  Surgeon: Terance Hart, MD;  Location: [facility];  Service: Orthopedics;  Laterality: Right;  . OVARIAN CYST REMOVAL Right 2011   "size of a soccer ball" (03/25/2013)  .  PAROTIDECTOMY Left 04/28/2019   Procedure: LEFT TOTAL PAROTIDECTOMY;  Surgeon: Newman Pies, MD;  Location: [facility];  Service: ENT;  Laterality: Left;  . RIGHT HEART CATH AND CORONARY ANGIOGRAPHY N/A 07/23/2022   Procedure: RIGHT HEART CATH AND CORONARY ANGIOGRAPHY;  Surgeon: Kathleene Hazel, MD;  Location: [facility] INVASIVE CV LAB;  Service: Cardiovascular;  Laterality: N/A;  . thrombosed hemorrohid lanced    . TONSILLECTOMY AND ADENOIDECTOMY    . TOTAL ABDOMINAL HYSTERECTOMY     Allergies  Allergen Reactions  . Codeine Sulfate Itching and Rash  . Hydrocodone-Acetaminophen Itching and Rash      Objective:    Physical Exam Vitals and nursing note reviewed.  Constitutional:      Appearance: Normal appearance. She is ill-appearing.     Interventions: Face mask in place.  HENT:     Right Ear: Tympanic membrane and ear canal normal.     Left Ear: Tympanic membrane and ear canal normal.     Nose: Rhinorrhea present.     Right Sinus: Frontal sinus tenderness (mild pressure) present.     Left Sinus: Frontal sinus tenderness (mild pressure) present.     Mouth/Throat:     Mouth: Mucous membranes are moist.     Pharynx: No pharyngeal swelling, oropharyngeal exudate, posterior oropharyngeal erythema or uvula swelling.     Tonsils: No tonsillar exudate or tonsillar abscesses.  Cardiovascular:     Rate and Rhythm: Normal rate and regular rhythm.  Pulmonary:     Effort: Pulmonary effort is normal.     Breath sounds: Normal breath sounds.  Musculoskeletal:        General: Normal range of motion.  Lymphadenopathy:     Head:     Right side of head: No preauricular or posterior auricular adenopathy.     Left side of head: No preauricular or posterior auricular adenopathy.     Cervical: No cervical adenopathy.  Skin:    General: Skin is warm and dry.  Neurological:     Mental Status: She is alert.  Psychiatric:        Mood and Affect: Mood normal.        Behavior:  Behavior  normal.   BP 122/76   Pulse 76   Temp 97.8 F (36.6 C)   Ht 5\' 2"  (1.575 m)   Wt 126 lb 6.4 oz (57.3 kg)   SpO2 96%   BMI 23.12 kg/m  Wt Readings from Last 3 Encounters:  06/27/23 126 lb 6.4 oz (57.3 kg)  06/10/23 126 lb (57.2 kg)  05/28/23 126 lb (57.2 kg)       Dulce Sellar, NP

## 2023-06-28 ENCOUNTER — Telehealth: Payer: Self-pay | Admitting: *Deleted

## 2023-06-28 ENCOUNTER — Other Ambulatory Visit: Payer: Self-pay | Admitting: *Deleted

## 2023-06-28 DIAGNOSIS — D649 Anemia, unspecified: Secondary | ICD-10-CM

## 2023-06-28 NOTE — Telephone Encounter (Signed)
ZOXWRUEA from [place] lab called to give critical lab result, positive IFOB test. Verbally told Dr. Ruthine Dose and sent note. Per Dr. Ruthine Dose: refer to GI. Referral placed. Left patient a detailed message.

## 2023-06-30 NOTE — Progress Notes (Addendum)
Cardiology Office Note:   Date:  07/01/2023  ID:  Katherine Foley, DOB 11/07/1950, MRN [id number] PCP: Shelva Majestic, MD  [facility] Providers Cardiologist:  Tonny Bollman, MD Cardiology APP:  Beatrice Lecher, PA-C    History of Present Illness:   Katherine Foley is a 72 y.o. female with a hx of: Coronary artery disease  NSTEMI in 2011 >> BMS to RCA  S/p DES to RI in 2012 S/p CABG in 2012 (L-LAD) + AVR Cath in 9/19: RI and RCA stents ok; L-LAD atretic, mild disease in mLAD >> med Rx LHC 07/23/2022: mLAD 30; RI stent patent with 30 ISR; mLCx 25; pRCA 30, mRCA stent patent with 30 ISR, dRCA 40-med Rx Aortic valve Dz s/p bioprosthetic AVR in 2012 Echo 10/22: EF 60-65, AVR gradient 24 mmHg (? from 10 mmHg in 2019) Echo 06/11/2022: EF 60-65, no RWMA, mild reduced RVSF, s/p AVR with normal function (mean gradient 17.3 mmHg), RAP 3 Asthma/COPD  Hypertension  Hyperlipidemia  L parotidectomy (path negative) Carotid artery disease [place] 8/21: Bilateral ICA 1-39; R subclavian stenosis  Discussed the use of AI scribe software for clinical note transcription with the patient, who gave verbal consent to proceed.  History of Present Illness   The patient, a 72 year old female with a history of coronary artery disease and aortic valve disease, presents for preoperative evaluation for a pending right reverse total shoulder arthroplasty. The patient reports occasional chest discomfort, which she attributes to emotional stress, particularly around the anniversaries of significant personal losses. The discomfort is not associated with exertion and the patient denies any shortness of breath.  The patient's medications include amlodipine, which was recently reduced to manage lower extremity swelling, a symptom that has since improved.  The patient denies any recent falls but acknowledges a need to be more intentional with movements due to age/some non-specific unsteadiness. She denies  any nocturnal shortness of breath/orthopnea, abdominal issues, or significant dizziness. The patient is active, working part-time at [employer] and walking her dog twice daily. She reports no chest pain or shortness of breath during these activities. Reports her blood pressure is typically in the 130/70 range.      Today patient denies chest pain with exertion (occurs with emotional distress only), shortness of breath, lower extremity edema, fatigue, palpitations, melena, hematuria, hemoptysis, diaphoresis, weakness, presyncope, syncope, orthopnea, and PND.   Studies Reviewed:    EKG:   EKG Interpretation Date/Time:  Monday July 01 2023 10:31:12 EDT Ventricular Rate:  69 PR Interval:  182 QRS Duration:  74 QT Interval:  414 QTC Calculation: 443 R Axis:   79  Text Interpretation: Normal sinus rhythm T wave abnormality, consider lateral ischemia When compared with ECG of 28-Aug-2022 14:22, No significant change was found Confirmed by Perlie Gold [id number]) on 07/01/2023 1:00:44 PM   07/23/22 LHC    Mid LAD lesion is 30% stenosed.   Mid Cx lesion is 25% stenosed.   Prox RCA to Mid RCA lesion is 30% stenosed.   Prox RCA lesion is 30% stenosed.   Dist RCA lesion is 40% stenosed.   Ost Ramus to Ramus lesion is 30% stenosed.   Mild plaque in the ostium of the left main Mild plaque in the mid LAD Mild plaque in the mid Circumflex  Patent intermediate branch stent with minimal restenosis Large dominant RCA with patent mid vessel stent with minimal restenosis.  Normal right heart pressures    Recommendations: Continue medical management of CAD.  06/11/22 TTE  IMPRESSIONS     1. Left ventricular ejection fraction, by estimation, is 60 to 65%. The  left ventricle has normal function. The left ventricle has no regional  wall motion abnormalities. Left ventricular diastolic parameters are  indeterminate.   2. Right ventricular systolic function is mildly reduced. The  right  ventricular size is normal.   3. The mitral valve is normal in structure. No evidence of mitral valve  regurgitation. No evidence of mitral stenosis.   4. The aortic valve has been repaired/replaced. Aortic valve  regurgitation is not visualized. No aortic stenosis is present. There is a  bioprosthetic valve present in the aortic position.   5. The inferior Katherine cava is normal in size with greater than 50%  respiratory variability, suggesting right atrial pressure of 3 mmHg.   FINDINGS   Left Ventricle: Left ventricular ejection fraction, by estimation, is 60  to 65%. The left ventricle has normal function. The left ventricle has no  regional wall motion abnormalities. The left ventricular internal cavity  size was normal in size. There is   no left ventricular hypertrophy. Left ventricular diastolic parameters  are indeterminate.   Right Ventricle: The right ventricular size is normal. No increase in  right ventricular wall thickness. Right ventricular systolic function is  mildly reduced.   Left Atrium: Left atrial size was normal in size.   Right Atrium: Right atrial size was normal in size.   Pericardium: There is no evidence of pericardial effusion.   Mitral Valve: The mitral valve is normal in structure. No evidence of  mitral valve regurgitation. No evidence of mitral valve stenosis.   Tricuspid Valve: The tricuspid valve is normal in structure. Tricuspid  valve regurgitation is not demonstrated. No evidence of tricuspid  stenosis.   Aortic Valve: The aortic valve has been repaired/replaced. Aortic valve  regurgitation is not visualized. No aortic stenosis is present. Aortic  valve mean gradient measures 17.3 mmHg. Aortic valve peak gradient  measures 32.0 mmHg. There is a bioprosthetic   valve present in the aortic position.   Pulmonic Valve: The pulmonic valve was normal in structure. Pulmonic valve  regurgitation is not visualized. No evidence of pulmonic  stenosis.   Aorta: The aortic root is normal in size and structure.   Venous: The inferior Katherine cava is normal in size with greater than 50%  respiratory variability, suggesting right atrial pressure of 3 mmHg.   IAS/Shunts: No atrial level shunt detected by color flow Doppler.   Risk Assessment/Calculations:              Physical Exam:   VS:  BP 130/68   Pulse 69   Ht 5' 2.5" (1.588 m)   Wt 125 lb (56.7 kg)   SpO2 94%   BMI 22.50 kg/m    Wt Readings from Last 3 Encounters:  07/01/23 125 lb (56.7 kg)  06/27/23 126 lb 6.4 oz (57.3 kg)  06/10/23 126 lb (57.2 kg)     Physical Exam Vitals reviewed.  Constitutional:      Appearance: Normal appearance.  HENT:     Head: Normocephalic.     Nose: Nose normal.  Eyes:     Pupils: Pupils are equal, round, and reactive to light.  Cardiovascular:     Rate and Rhythm: Normal rate and regular rhythm.     Pulses: Normal pulses.     Heart sounds: Murmur heard.     Systolic (RUSB) murmur is present with a grade of  2/6.     No friction rub. No gallop.  Pulmonary:     Effort: Pulmonary effort is normal.     Breath sounds: Normal breath sounds.  Abdominal:     General: Abdomen is flat.  Musculoskeletal:     Right lower leg: No edema.     Left lower leg: No edema.  Skin:    General: Skin is warm and dry.     Capillary Refill: Capillary refill takes less than 2 seconds.  Neurological:     General: No focal deficit present.     Mental Status: She is alert and oriented to person, place, and time.  Psychiatric:        Mood and Affect: Mood normal.        Behavior: Behavior normal.        Thought Content: Thought content normal.        Judgment: Judgment normal.    ASSESSMENT AND PLAN:     Assessment and Plan    Preoperative Evaluation for Right Reverse Total Shoulder Arthroplasty Patient is asymptomatic with no chest pain or shortness of breath with exertion. She has a history of coronary artery disease but is not  experiencing angina. According to the Revised Cardiac Risk Index (RCRI), her Perioperative Risk of Major Cardiac Event is (%): 0.9 Her Functional Capacity in METs is: 8.97 according to the Duke Activity Status Index (DASI). -Plan to repeat echocardiogram prior to surgery to ensure stability of the valve and gradients. If stable, will send note with risk stratification to surgeon. -Per protocol, can hold aspirin 5-7 days prior to surgery or as directed by the surgeon.  Addendum: Echocardiogram shows stable valve function. Therefore, based on ACC/AHA guidelines, patient would be at acceptable risk for the planned procedure without further cardiovascular testing. I will route this recommendation to the requesting party via Epic fax function.   CAD Patient stable without angina or exertional dyspnea. ECG with stable non-specific lateral lead TWI. 07/23/22 LHC with stable CAD. -Continue lipid management -Continue ASA 81mg  -Continue Imdur 15mg  daily.   Hyperlipidemia Last LDL 44 as of 03/05/22. Check LDL today, goal less than 55. -Continue Rosuvastatin 40mg  and Zetia 10mg .  Hypertension Blood pressure is borderline high at 130/68. Patient recently had amlodipine dose decreased due to lower extremity swelling, which has improved. -Recommend patient monitor blood pressure at home. Call office for BP consistently above 130/80. -Continue Amlodipine 2.5mg , Imdur 15mg  daily  S/P AVR 06/2022 echocardiogram showed a transaortic mean gradient of 17.3 mmHg with a peak gradient of . Bioprosthetic valve with stable function. -As above, repeat echocardiogram prior to surgery to ensure stability of the valve and gradients.  -Continue ASA 81mg              Signed, Perlie Gold, PA-C

## 2023-07-01 ENCOUNTER — Encounter: Payer: Self-pay | Admitting: Cardiology

## 2023-07-01 ENCOUNTER — Other Ambulatory Visit (INDEPENDENT_AMBULATORY_CARE_PROVIDER_SITE_OTHER): Payer: Medicare HMO

## 2023-07-01 ENCOUNTER — Encounter: Payer: Self-pay | Admitting: Gastroenterology

## 2023-07-01 ENCOUNTER — Ambulatory Visit: Payer: Medicare HMO | Attending: Physician Assistant | Admitting: Cardiology

## 2023-07-01 VITALS — BP 130/68 | HR 69 | Ht 62.5 in | Wt 125.0 lb

## 2023-07-01 DIAGNOSIS — Z952 Presence of prosthetic heart valve: Secondary | ICD-10-CM

## 2023-07-01 DIAGNOSIS — Z01810 Encounter for preprocedural cardiovascular examination: Secondary | ICD-10-CM

## 2023-07-01 DIAGNOSIS — I2510 Atherosclerotic heart disease of native coronary artery without angina pectoris: Secondary | ICD-10-CM

## 2023-07-01 DIAGNOSIS — J449 Chronic obstructive pulmonary disease, unspecified: Secondary | ICD-10-CM

## 2023-07-01 DIAGNOSIS — I700 Atherosclerosis of aorta: Secondary | ICD-10-CM | POA: Diagnosis not present

## 2023-07-01 DIAGNOSIS — D649 Anemia, unspecified: Secondary | ICD-10-CM | POA: Diagnosis not present

## 2023-07-01 DIAGNOSIS — E785 Hyperlipidemia, unspecified: Secondary | ICD-10-CM | POA: Diagnosis not present

## 2023-07-01 DIAGNOSIS — R072 Precordial pain: Secondary | ICD-10-CM | POA: Diagnosis not present

## 2023-07-01 LAB — FECAL OCCULT BLOOD, IMMUNOCHEMICAL: Fecal Occult Bld: POSITIVE — AB

## 2023-07-01 NOTE — Patient Instructions (Addendum)
Medication Instructions:  Your physician recommends that you continue on your current medications as directed. Please refer to the Current Medication list given to you today.  *If you need a refill on your cardiac medications before your next appointment, please call your pharmacy*   Lab Work: TODAY:  DIRECT LDL  If you have labs (blood work) drawn today and your tests are completely normal, you will receive your results only by: MyChart Message (if you have MyChart) OR A paper copy in the mail If you have any lab test that is abnormal or we need to change your treatment, we will call you to review the results.   Testing/Procedures: Your physician has requested that you have an echocardiogram. Echocardiography is a painless test that uses sound waves to create images of your heart. It provides your doctor with information about the size and shape of your heart and how well your heart's chambers and valves are working. This procedure takes approximately one hour. There are no restrictions for this procedure. Please do NOT wear cologne, perfume, aftershave, or lotions (deodorant is allowed). Please arrive 15 minutes prior to your appointment time.    Follow-Up: At [facility], you and your health needs are our priority.  As part of our continuing mission to provide you with exceptional heart care, we have created designated Provider Care Teams.  These Care Teams include your primary Cardiologist (physician) and Advanced Practice Providers (APPs -  Physician Assistants and Nurse Practitioners) who all work together to provide you with the care you need, when you need it.  We recommend signing up for the patient portal called "MyChart".  Sign up information is provided on this After Visit Summary.  MyChart is used to connect with patients for Virtual Visits (Telemedicine).  Patients are able to view lab/test results, encounter notes, upcoming appointments, etc.  Non-urgent messages can  be sent to your provider as well.   To learn more about what you can do with MyChart, go to [email or web address].    Your next appointment:   6 month(s)  Provider:   Tonny Bollman, MD  or Perlie Gold, PA-C         Other Instructions

## 2023-07-02 ENCOUNTER — Other Ambulatory Visit: Payer: Self-pay | Admitting: Family Medicine

## 2023-07-02 ENCOUNTER — Telehealth: Payer: Self-pay | Admitting: Family Medicine

## 2023-07-02 LAB — LDL CHOLESTEROL, DIRECT: LDL Direct: 39 mg/dL (ref 0–99)

## 2023-07-02 NOTE — Telephone Encounter (Signed)
Patient requests to be called to discuss Lab message received on MyChart

## 2023-07-03 NOTE — Telephone Encounter (Signed)
Called and lm for pt tcb. 

## 2023-07-05 ENCOUNTER — Other Ambulatory Visit: Payer: Self-pay

## 2023-07-05 DIAGNOSIS — R195 Other fecal abnormalities: Secondary | ICD-10-CM

## 2023-07-15 NOTE — Progress Notes (Signed)
Katherine Foley is a 72 y.o. female here for a new problem. History of Present Illness:  No chief complaint on file.  HPI Sore Throat: Complains of sore throat with associated drainage *** *** *** *** *** ***  *** *** *** *** *** Past Medical History:  Diagnosis Date   Adenomatous colon polyp    Anxiety    Anxiety/Depression 02/15/2009   Aortic stenosis s/p bioprosthetic AVR 07/07/2010   Echo 9/19:  Mild conc LVH, EF 60-65, no RWMA, Gr 1 DD, AVR ok (mean 10, peak 21), trivial MR, normal RVSF, mild TR // Echo 10/22: EF 60-65, no RWMA, normal RVSF, AVR with increased gradients (mean 24 increased from 10 in 2019) // Echocardiogram 10/23: EF 60-65, no RWMA, mildly reduced RVSF, AVR w mean gradient 17.3 mmHg   Barrett's esophagus    04/2007; 2000   Carotid stenosis 05/17/2010   a. dopplers 11/11: RICA 40-50%; LICA 0-39% (follow up due 11/12)   COPD 07/23/2007   Coronary Artery Disease 05/17/2010   NSTEMI 8/11 - BMS to RCA;  cath 4/12: dLAD 50%, RI 80% (PCI), AVCFX 30%, pRCA 30%, stent ok, 40-50, then 50% dist RCA;  c. s/p Promus DES to RI 12/25/10;  d. 04/2011 CABG x 1 (LIMA->LAD) @ time of AVR // Cath in 9/19: RI and RCA stents ok; L-LAD atretic, mild disease in mLAD >> med Rx   Family history of breast cancer    2 daughters; PALB2 mutation in family   Genetic testing 02/10/2018   PALB2 analysis at [facility] - Negative for familial mutation   GERD    H/O hiatal hernia    Hemorrhoid thrombosis    "had it lanced" (03/25/2013)   Herpes simplex without mention of complication 01/16/2010   History of blood transfusion    'w/OHS" (03/25/2013)   Hyperlipidemia 08/29/2009   Hypertension 04/14/2007   Migraine HAs 04/20/2008   Myocardial infarction [facility]) 2011   "during catherization" (03/25/2013)   OSTEOPENIA 04/14/2007   Ovarian cyst    Parotid mass    left   Pneumonia    "multiple times" (03/25/2013)   Urine, incontinence, stress female     Social History   Tobacco Use    Smoking status: Former    Current packs/day: 0.00    Average packs/day: 1.5 packs/day for 44.0 years (66.0 ttl pk-yrs)    Types: Cigarettes    Start date: 09/03/1961    Quit date: 09/03/2005    Years since quitting: 17.8   Smokeless tobacco: Never  Vaping Use   Vaping status: Never Used  Substance Use Topics   Alcohol use: Not Currently    Alcohol/week: 0.0 standard drinks of alcohol    Comment: social   Drug use: No   Past Surgical History:  Procedure Laterality Date   AORTIC VALVE REPLACEMENT  2012   APPENDECTOMY     BLEPHAROPLASTY Bilateral ~ 1998   CARDIAC CATHETERIZATION N/A 07/06/2016   Procedure: Left Heart Cath and Cors/Grafts Angiography;  Surgeon: Tonny Bollman, MD;  Location: [facility] INVASIVE CV LAB;  Service: Cardiovascular;  Laterality: N/A;   CHOLECYSTECTOMY N/A 03/25/2013   Procedure: LAPAROSCOPIC CHOLECYSTECTOMY WITH INTRAOPERATIVE CHOLANGIOGRAM;  Surgeon: Wilmon Arms. Corliss Skains, MD;  Location: [facility] OR;  Service: General;  Laterality: N/A;   CORONARY ANGIOGRAPHY N/A 05/26/2018   Procedure: CORONARY ANGIOGRAPHY (CATH LAB);  Surgeon: Corky Crafts, MD;  Location: [facility] INVASIVE CV LAB;  Service: Cardiovascular;  Laterality: N/A;   CORONARY ANGIOPLASTY WITH STENT PLACEMENT  2010   "I  have 2 stents" (03/25/2013)   NASAL SINUS SURGERY  1997; 1998   "cust in maxillary sinus; put a stent in then removed it" (03/25/2013)   ORIF ANKLE FRACTURE Right 02/14/2021   Procedure: OPEN TREATMENT OF RIGHT TRIMALLEOLAR ANKLE FRACTURE WITHOUT POSTERIOR FIXATION, SYNDESMOSIS;  Surgeon: Terance Hart, MD;  Location: [facility];  Service: Orthopedics;  Laterality: Right;   OVARIAN CYST REMOVAL Right 2011   "size of a soccer ball" (03/25/2013)   PAROTIDECTOMY Left 04/28/2019   Procedure: LEFT TOTAL PAROTIDECTOMY;  Surgeon: Newman Pies, MD;  Location: [facility];  Service: ENT;  Laterality: Left;   RIGHT HEART CATH AND CORONARY ANGIOGRAPHY N/A 07/23/2022   Procedure: RIGHT  HEART CATH AND CORONARY ANGIOGRAPHY;  Surgeon: Kathleene Hazel, MD;  Location: [facility] INVASIVE CV LAB;  Service: Cardiovascular;  Laterality: N/A;   thrombosed hemorrohid lanced     TONSILLECTOMY AND ADENOIDECTOMY     TOTAL ABDOMINAL HYSTERECTOMY     Family History  Problem Relation Age of Onset   Coronary artery disease Other    Heart disease Other        6 stents   Heart disease Mother    COPD Father        smoked   Heart disease Father    Rheum arthritis Father    Heart disease Maternal Uncle    Breast cancer Daughter        currently 73   Asthma Son        as a child    Breast cancer Daughter 47       currently 54; PALB2 mutation   Cancer Other        very distant paternal cousin; pancreatic ca   Cirrhosis Sister    Stroke Brother    Colon cancer Neg Hx    Colon polyps Neg Hx    Allergies  Allergen Reactions   Codeine Sulfate Itching and Rash   Hydrocodone-Acetaminophen Itching and Rash   Current Medications:   Current Outpatient Medications:    acetaminophen (TYLENOL) 650 MG CR tablet, Take 1,300 mg by mouth every 8 (eight) hours as needed for pain., Disp: , Rfl:    albuterol (VENTOLIN HFA) 108 (90 Base) MCG/ACT inhaler, Inhale 2 puffs into the lungs every 6 (six) hours as needed for wheezing or shortness of breath., Disp: 8 g, Rfl: 3   amLODipine (NORVASC) 2.5 MG tablet, Take 1 tablet (2.5 mg total) by mouth daily., Disp: 90 tablet, Rfl: 3   aspirin EC 81 MG tablet, Take 1 tablet (81 mg total) by mouth daily., Disp:  , Rfl:    Budeson-Glycopyrrol-Formoterol (BREZTRI AEROSPHERE) 160-9-4.8 MCG/ACT AERO, Inhale 2 puffs into the lungs in the morning and at bedtime., Disp: 1 each, Rfl: 6   buPROPion (WELLBUTRIN XL) 300 MG 24 hr tablet, Take 1 tablet (300 mg total) by mouth daily., Disp: 90 tablet, Rfl: 3   Calcium Carb-Cholecalciferol (CALCIUM 500/D PO), Take 2 tablets by mouth daily. Mag and zinc added, Disp: , Rfl:    desvenlafaxine (PRISTIQ) 100 MG 24 hr tablet,  TAKE 1 TABLET BY MOUTH EVERY MORNING, Disp: 30 tablet, Rfl: 10   dexlansoprazole (DEXILANT) 60 MG capsule, Take 1 capsule (60 mg total) by mouth daily., Disp: 90 capsule, Rfl: 3   ezetimibe (ZETIA) 10 MG tablet, TAKE ONE TABLET BY MOUTH ONCE DAILY, Disp: 90 tablet, Rfl: 3   fluticasone (CUTIVATE) 0.05 % cream, Apply 1 Application topically 2 (two) times daily as needed (eczema).,  Disp: , Rfl:    fluticasone (FLONASE) 50 MCG/ACT nasal spray, Place 2 sprays into both nostrils as needed for allergies or rhinitis., Disp: 16 g, Rfl: 3   gabapentin (NEURONTIN) 300 MG capsule, TAKE ONE CAPSULE BY MOUTH EVERYDAY AT BEDTIME, Disp: 90 capsule, Rfl: 3   ipratropium-albuterol (DUONEB) 0.5-2.5 (3) MG/3ML SOLN, Take 3 mLs by nebulization every 6 (six) hours as needed., Disp: 360 mL, Rfl: 4   isosorbide mononitrate (IMDUR) 30 MG 24 hr tablet, TAKE 1/2 TABLET BY MOUTH ONCE DAILY, Disp: 45 tablet, Rfl: 3   LORazepam (ATIVAN) 0.5 MG tablet, TAKE ONE (1) TABLET BY MOUTH TWICE DAILY AS NEEDED FOR ANXIETY *DO NOT DRIVE FOR 8 HOURS AFTER USING, Disp: 60 tablet, Rfl: 0   Multiple Vitamins-Minerals (MULTIVITAMIN ADULT PO), Take 1 tablet by mouth daily., Disp: , Rfl:    nitroGLYCERIN (NITROSTAT) 0.4 MG SL tablet, Place 1 tablet (0.4 mg total) under the tongue every 5 (five) minutes as needed for chest pain (if pain does not resolve with 2 doses, call 911)., Disp: 30 tablet, Rfl: 5   rosuvastatin (CRESTOR) 40 MG tablet, TAKE ONE TABLET BY MOUTH ONCE DAILY, Disp: 90 tablet, Rfl: 2   triamcinolone (NASACORT) 55 MCG/ACT AERO nasal inhaler, Place 1 spray into the nose daily. Start with 1 spray each side twice a day for 3 days, then reduce to daily., Disp: 1 each, Rfl: 2   valACYclovir (VALTREX) 1000 MG tablet, TAKE 2 TABLETS BY MOUTH TWICE DAILY FOR 1 DAY AT FIRST SIGN OF COLD SORE *REFILL REQUEST* (Patient taking differently: 1,000 mg as needed. 2 tabs am, 2 tabs pm), Disp: 30 tablet, Rfl: 10  Review of Systems:   ROS See  pertinent positives and negatives as per the HPI.  Vitals:   There were no vitals filed for this visit.   There is no height or weight on file to calculate BMI.  Physical Exam:   Physical Exam  Assessment and Plan:   There are no diagnoses linked to this encounter.            I,Emily Lagle,acting as a Neurosurgeon for [employer], PA.,have documented all relevant documentation on the behalf of Jarold Motto, PA,as directed by  Jarold Motto, PA while in the presence of Jarold Motto, [place].  *** (refresh reminder)  I, Jarold Motto, PA, have reviewed all documentation for this visit. The documentation on 07/15/23 for the exam, diagnosis, procedures, and orders are all accurate and complete.  Jarold Motto, PA-C

## 2023-07-16 ENCOUNTER — Ambulatory Visit (INDEPENDENT_AMBULATORY_CARE_PROVIDER_SITE_OTHER): Payer: Medicare HMO | Admitting: Physician Assistant

## 2023-07-16 VITALS — BP 130/76 | HR 66 | Temp 98.4°F | Ht 62.5 in | Wt 127.4 lb

## 2023-07-16 DIAGNOSIS — F4381 Prolonged grief disorder: Secondary | ICD-10-CM | POA: Diagnosis not present

## 2023-07-16 DIAGNOSIS — J029 Acute pharyngitis, unspecified: Secondary | ICD-10-CM | POA: Diagnosis not present

## 2023-07-16 DIAGNOSIS — J441 Chronic obstructive pulmonary disease with (acute) exacerbation: Secondary | ICD-10-CM | POA: Diagnosis not present

## 2023-07-16 LAB — POC COVID19 BINAXNOW: SARS Coronavirus 2 Ag: NEGATIVE

## 2023-07-16 LAB — POCT RAPID STREP A (OFFICE): Rapid Strep A Screen: NEGATIVE

## 2023-07-16 LAB — POC INFLUENZA A&B (BINAX/QUICKVUE)
Influenza A, POC: NEGATIVE
Influenza B, POC: NEGATIVE

## 2023-07-16 MED ORDER — AZITHROMYCIN 250 MG PO TABS
ORAL_TABLET | ORAL | 0 refills | Status: AC
Start: 2023-07-16 — End: 2023-07-21

## 2023-07-16 MED ORDER — PREDNISONE 50 MG PO TABS: ORAL_TABLET | ORAL | 0 refills | Status: DC

## 2023-07-16 NOTE — Patient Instructions (Signed)
It was great to see you!  Start oral prednisone and antibiotic(s) - azithromycin  Recommend OTC (available over the counter without a prescription) mucinex to help loosen up chest congestion  If any new/worsening symptom(s), please reach out to [place] as soon as possible for next steps  Take care,  Jarold Motto PA-C

## 2023-07-26 ENCOUNTER — Other Ambulatory Visit: Payer: Self-pay | Admitting: Family

## 2023-07-30 ENCOUNTER — Ambulatory Visit (HOSPITAL_COMMUNITY): Payer: Medicare HMO | Attending: Physician Assistant

## 2023-07-30 ENCOUNTER — Ambulatory Visit: Payer: Medicare HMO | Admitting: Physician Assistant

## 2023-07-31 ENCOUNTER — Other Ambulatory Visit: Payer: Self-pay | Admitting: Family

## 2023-08-06 ENCOUNTER — Ambulatory Visit (HOSPITAL_COMMUNITY)
Admission: RE | Admit: 2023-08-06 | Discharge: 2023-08-06 | Disposition: A | Discharge status: Home / Self Care | Payer: Medicare HMO | Source: Ambulatory Visit | Attending: Physician Assistant | Admitting: Physician Assistant

## 2023-08-06 DIAGNOSIS — Z952 Presence of prosthetic heart valve: Secondary | ICD-10-CM | POA: Diagnosis not present

## 2023-08-06 DIAGNOSIS — Z9889 Other specified postprocedural states: Secondary | ICD-10-CM | POA: Diagnosis not present

## 2023-08-06 LAB — ECHOCARDIOGRAM COMPLETE
AR max vel: 0.89 cm2
AV Area VTI: 0.74 cm2
AV Area mean vel: 0.84 cm2
AV Mean grad: 22 mm[Hg]
AV Peak grad: 34.8 mm[Hg]
Ao pk vel: 2.95 m/s
Area-P 1/2: 2.17 cm2
S' Lateral: 2.6 cm

## 2023-08-09 DIAGNOSIS — M25511 Pain in right shoulder: Secondary | ICD-10-CM | POA: Diagnosis not present

## 2023-08-13 DIAGNOSIS — F4381 Prolonged grief disorder: Secondary | ICD-10-CM | POA: Diagnosis not present

## 2023-08-16 ENCOUNTER — Telehealth: Payer: Self-pay | Admitting: Family Medicine

## 2023-08-16 NOTE — Telephone Encounter (Signed)
Sarah with [facility] on behalf of Patient requests to the following:  Prescription Request  08/16/2023  LOV: 12/14/2022  What is the name of the medication or equipment? LORazepam (ATIVAN) 0.5 MG tablet   Have you contacted your pharmacy to request a refill? Yes   Which pharmacy would you like this sent to?   [facility] - [address], [place] - [address] [place] [address] Phone: [phone] Fax: [phone]    Patient notified that their request is being sent to the clinical staff for review and that they should receive a response within 2 business days.   Please advise at [phone]

## 2023-08-16 NOTE — Telephone Encounter (Signed)
I do not mind refilling this but can we first get her set up for follow-up-send it back to me when she is scheduled

## 2023-08-19 NOTE — Telephone Encounter (Signed)
LVM to schedule a FU & then RX can be filled

## 2023-08-20 MED ORDER — LORAZEPAM 0.5 MG PO TABS: ORAL_TABLET | ORAL | 0 refills | Status: DC

## 2023-08-20 NOTE — Telephone Encounter (Signed)
Please let her know I sent prescription in and look forward to seeing her on January 6

## 2023-08-27 ENCOUNTER — Other Ambulatory Visit: Payer: Self-pay | Admitting: Family Medicine

## 2023-08-29 NOTE — Telephone Encounter (Signed)
Given 60 on 08/20/23- why is she requesting again 9 days later?

## 2023-09-02 ENCOUNTER — Telehealth: Payer: Self-pay

## 2023-09-02 ENCOUNTER — Ambulatory Visit (INDEPENDENT_AMBULATORY_CARE_PROVIDER_SITE_OTHER): Payer: Medicare HMO | Admitting: Family Medicine

## 2023-09-02 VITALS — BP 102/60 | HR 78 | Temp 97.3°F | Ht 62.5 in | Wt 124.4 lb

## 2023-09-02 DIAGNOSIS — I10 Essential (primary) hypertension: Secondary | ICD-10-CM | POA: Diagnosis not present

## 2023-09-02 DIAGNOSIS — J449 Chronic obstructive pulmonary disease, unspecified: Secondary | ICD-10-CM | POA: Diagnosis not present

## 2023-09-02 MED ORDER — AZITHROMYCIN 250 MG PO TABS: ORAL_TABLET | ORAL | 0 refills | Status: DC

## 2023-09-02 MED ORDER — BUPROPION HCL ER (XL) 150 MG PO TB24: 150.0000 mg | ORAL_TABLET | Freq: Every day | ORAL | 0 refills | Status: DC

## 2023-09-02 MED ORDER — PREDNISONE 50 MG PO TABS: ORAL_TABLET | ORAL | 0 refills | Status: DC

## 2023-09-02 NOTE — Patient Instructions (Addendum)
It was very nice to see you today!  Please start the prednisone and and azithromycin.  We will increase your Wellbutrin to 450 mg daily.  Let me know in a few weeks how you are doing.   Return if symptoms worsen or fail to improve.   Take care, Dr Jimmey Ralph  PLEASE NOTE:  If you had any lab tests, please let us know if you have not heard back within a few days. You may see your results on mychart before we have a chance to review them but we will give you a call once they are reviewed by [place].   If we ordered any referrals today, please let us know if you have not heard from their office within the next week.   If you had any urgent prescriptions sent in today, please check with the pharmacy within an hour of our visit to make sure the prescription was transmitted appropriately.   Please try these tips to maintain a healthy lifestyle:  Eat at least 3 REAL meals and 1-2 snacks per day.  Aim for no more than 5 hours between eating.  If you eat breakfast, please do so within one hour of getting up.   Each meal should contain half fruits/vegetables, one quarter protein, and one quarter carbs (no bigger than a computer mouse)  Cut down on sweet beverages. This includes juice, soda, and sweet tea.   Drink at least 1 glass of water with each meal and aim for at least 8 glasses per day  Exercise at least 150 minutes every week.

## 2023-09-02 NOTE — Telephone Encounter (Signed)
Pt states that she does not need a refill on any meds but the Breztri inhaler, Dr Kendrick Fries is the prescribing physician, when she called to request refills from that office she was told he no longer works there, pt is requesting for Dr Durene Cal to prescribe.

## 2023-09-02 NOTE — Telephone Encounter (Signed)
You may refill Breztri with current directions under my name

## 2023-09-02 NOTE — Progress Notes (Signed)
   Katherine Foley is a 72 y.o. female who presents today for an office visit.  Assessment/Plan:  Chronic Problems Addressed Today: COPD Patient with acute flare today.  No red flag signs or symptoms.  No signs of respiratory distress.  May be developing a sinus infection as well.  We will start prednisone and azithromycin.  This is worked well for previous COPD flares.  She can continue her maintenance medications as previously prescribed.  She will let us know if not improving in the next several days.  Depression  Not controlled.  Symptoms have worsened the last few weeks.  PHQ score today is 21.  No active SI or HI.  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  She is seeing a Veterinary surgeon which is working well.  She does have good support at home and is trying to stay busy with work.  We discussed medication adjustment options.  Will increase her Wellbutrin to 450 mg daily.  She can continue her Pristiq 100 mg daily.  She will follow-up with me or her PCP within the next couple of weeks.    Subjective:  HPI:  Patient here with cough and congestion for the last several days. She is concerned about a sinus infection.  No fevers or chills. Some wheezing and shortness of breath. Cough is non productive.   She does admit that her depression has been worse recently due to the hlidays. She is still freiving the loss of her daughter and this time of year is difficult. She does have a counselor that she is seeing once monthly. She is trying to stay busy with work which does help. She has also an emotional support animal which helps.      09/02/2023   11:32 AM 06/10/2023    8:36 AM 05/28/2023   10:38 AM 03/08/2023    8:10 AM 12/14/2022    1:55 PM  Depression screen PHQ 2/9  Decreased Interest 3 1 0 3 1  Down, Depressed, Hopeless 3 1 0 3 2  PHQ - 2 Score 6 2 0 6 3  Altered sleeping 3 3  3 3   Tired, decreased energy 3 3  3 3   Change in appetite 3 0  0 0  Feeling  bad or failure about yourself  2 3  3 1   Trouble concentrating 2 0  0 2  Moving slowly or fidgety/restless 1 0  0 0  Suicidal thoughts 1 0  1 0  PHQ-9 Score 21 11  16 12   Difficult doing work/chores    Somewhat difficult Somewhat difficult          Objective:  Physical Exam: BP 102/60   Pulse 78   Temp (!) 97.3 F (36.3 C) (Temporal)   Ht 5' 2.5" (1.588 m)   Wt 124 lb 6.4 oz (56.4 kg)   SpO2 93%   BMI 22.39 kg/m   Gen: No acute distress, resting comfortably HEENT: TMs clear.  OP clear. CV: Regular rate and rhythm with no murmurs appreciated Pulm: Normal work of breathing, occasional expiratory wheeze noted.  No rhonchi. Neuro: Grossly normal, moves all extremities Psych: Normal affect and thought content      Katherine Foley M. Jimmey Ralph, MD 09/02/2023 11:50 AM

## 2023-09-02 NOTE — Telephone Encounter (Signed)
Created new TE to cut down any further confusion on what medication pt is requesting. Pt states that she does not need a refill on any meds but the Breztri inhaler, Dr Kendrick Fries is the prescribing physician, when she called to request refills from that office she was told he no longer works there, pt is requesting for Dr Durene Cal to prescribe.

## 2023-09-03 MED ORDER — BREZTRI AEROSPHERE 160-9-4.8 MCG/ACT IN AERO: 2.0000 | INHALATION_SPRAY | Freq: Two times a day (BID) | RESPIRATORY_TRACT | 6 refills | Status: DC

## 2023-09-03 NOTE — Telephone Encounter (Signed)
Placed refill per Dr. Erasmo Leventhal approval and sent to patient's pharmacy.

## 2023-09-09 ENCOUNTER — Ambulatory Visit: Payer: Medicare HMO | Admitting: Family Medicine

## 2023-09-09 ENCOUNTER — Other Ambulatory Visit: Payer: Self-pay

## 2023-09-18 NOTE — Progress Notes (Signed)
Chief Complaint: Anemia, heme positive stool Primary GI Doctor: Dr. Myrtie Neither  HPI: Patient is a 73 year old female patient with past medical history of CAD, aortic valve disease, CABG (2012), hypertension, anxiety, depression, COPD, who was referred to me by Dr. Jeani Sow on 06/28/23 for a complaint of anemia and heme positive stool.    On 10/07/17 patient last seen in GI office by Dr. Myrtie Neither for GERD and emesis (intermittent). EGD was scheduled. -EGD 10/14/2017 was completely normal. -NM gastric emptying study 10/23/17 ordered-Normal gastric emptying study.  -Prescribed 4 week trial of Reglan. -EGD November 2015 revealed mild antral erythema, biopsy negative for H. pylori. No stricture was identified, a wire-guided dilation was empirically performed.  -No polyps seen on colonoscopy November 2015. Recall 10 years.  07/01/23 seen by cardiology for preop evaluation for rt reverse total shoulder arthroplasty. Recommendations: Continue medical management of CAD. Repeat echo. Echocardiogram shows stable valve function. (Reviewed note)  Interval History     Patient presents to discussed recent heme occult that was positive. She reports she was not having and overt bleeding. Patient has history of GERD and taking Dexilant 60 mg po daily which controls her symptoms. Patient denies dysphagia. Patient reports chronic intermittent episodes of nausea that recently have increased the past week or two. She has vomited a few times. Denies hematemesis. Patient takes ASA 81 mg po daily. Denies NSAID use. Denies alcohol use. Nonsmoker.    Patient reports she has bowel movement every 2-3 days and will have several bowel movements throughout the day. She will have 3-4 stools per day, semi formed. She reports when she passes gas or urinates she will sometimes pass stool. Some abdominal discomfort when she is more constipated that is relieved with defecation. Denies rectal bleeding. Admits fatigue and SOB with exertion.  Patient's family history includes daughter with breast CA who passed away.  Wt Readings from Last 3 Encounters:  09/19/23 126 lb (57.2 kg)  09/02/23 124 lb 6.4 oz (56.4 kg)  07/16/23 127 lb 6.1 oz (57.8 kg)    Past Medical History:  Diagnosis Date   Adenomatous colon polyp    Anxiety    Anxiety/Depression 02/15/2009   Aortic stenosis s/p bioprosthetic AVR 07/07/2010   Echo 9/19:  Mild conc LVH, EF 60-65, no RWMA, Gr 1 DD, AVR ok (mean 10, peak 21), trivial MR, normal RVSF, mild TR // Echo 10/22: EF 60-65, no RWMA, normal RVSF, AVR with increased gradients (mean 24 increased from 10 in 2019) // Echocardiogram 10/23: EF 60-65, no RWMA, mildly reduced RVSF, AVR w mean gradient 17.3 mmHg   Barrett's esophagus    04/2007; 2000   Carotid stenosis 05/17/2010   a. dopplers 11/11: RICA 40-50%; LICA 0-39% (follow up due 11/12)   COPD 07/23/2007   Coronary Artery Disease 05/17/2010   NSTEMI 8/11 - BMS to RCA;  cath 4/12: dLAD 50%, RI 80% (PCI), AVCFX 30%, pRCA 30%, stent ok, 40-50, then 50% dist RCA;  c. s/p Promus DES to RI 12/25/10;  d. 04/2011 CABG x 1 (LIMA->LAD) @ time of AVR // Cath in 9/19: RI and RCA stents ok; L-LAD atretic, mild disease in mLAD >> med Rx   Family history of breast cancer    2 daughters; PALB2 mutation in family   Genetic testing 02/10/2018   PALB2 analysis at [facility] - Negative for familial mutation   GERD    H/O hiatal hernia    Hemorrhoid thrombosis    "had it lanced" (03/25/2013)  Herpes simplex without mention of complication 01/16/2010   History of blood transfusion    'w/OHS" (03/25/2013)   Hyperlipidemia 08/29/2009   Hypertension 04/14/2007   Migraine HAs 04/20/2008   Myocardial infarction [facility]) 2011   "during catherization" (03/25/2013)   OSTEOPENIA 04/14/2007   Ovarian cyst    Parotid mass    left   Pneumonia    "multiple times" (03/25/2013)   Urine, incontinence, stress female    Past Surgical History:  Procedure Laterality Date   AORTIC VALVE  REPLACEMENT  2012   APPENDECTOMY     BLEPHAROPLASTY Bilateral ~ 1998   CARDIAC CATHETERIZATION N/A 07/06/2016   Procedure: Left Heart Cath and Cors/Grafts Angiography;  Surgeon: Tonny Bollman, MD;  Location: [facility] INVASIVE CV LAB;  Service: Cardiovascular;  Laterality: N/A;   CHOLECYSTECTOMY N/A 03/25/2013   Procedure: LAPAROSCOPIC CHOLECYSTECTOMY WITH INTRAOPERATIVE CHOLANGIOGRAM;  Surgeon: Wilmon Arms. Corliss Skains, MD;  Location: [facility] OR;  Service: General;  Laterality: N/A;   CORONARY ANGIOGRAPHY N/A 05/26/2018   Procedure: CORONARY ANGIOGRAPHY (CATH LAB);  Surgeon: Corky Crafts, MD;  Location: [facility] INVASIVE CV LAB;  Service: Cardiovascular;  Laterality: N/A;   CORONARY ANGIOPLASTY WITH STENT PLACEMENT  2010   "I have 2 stents" (03/25/2013)   NASAL SINUS SURGERY  1997; 1998   "cust in maxillary sinus; put a stent in then removed it" (03/25/2013)   ORIF ANKLE FRACTURE Right 02/14/2021   Procedure: OPEN TREATMENT OF RIGHT TRIMALLEOLAR ANKLE FRACTURE WITHOUT POSTERIOR FIXATION, SYNDESMOSIS;  Surgeon: Terance Hart, MD;  Location: [facility];  Service: Orthopedics;  Laterality: Right;   OVARIAN CYST REMOVAL Right 2011   "size of a soccer ball" (03/25/2013)   PAROTIDECTOMY Left 04/28/2019   Procedure: LEFT TOTAL PAROTIDECTOMY;  Surgeon: Newman Pies, MD;  Location: [facility];  Service: ENT;  Laterality: Left;   RIGHT HEART CATH AND CORONARY ANGIOGRAPHY N/A 07/23/2022   Procedure: RIGHT HEART CATH AND CORONARY ANGIOGRAPHY;  Surgeon: Kathleene Hazel, MD;  Location: [facility] INVASIVE CV LAB;  Service: Cardiovascular;  Laterality: N/A;   thrombosed hemorrohid lanced     TONSILLECTOMY AND ADENOIDECTOMY     TOTAL ABDOMINAL HYSTERECTOMY     Current Outpatient Medications  Medication Sig Dispense Refill   acetaminophen (TYLENOL) 650 MG CR tablet Take 1,300 mg by mouth every 8 (eight) hours as needed for pain.     albuterol (VENTOLIN HFA) 108 (90 Base) MCG/ACT inhaler Inhale 2  puffs into the lungs every 6 (six) hours as needed for wheezing or shortness of breath. 8 g 3   aspirin EC 81 MG tablet Take 1 tablet (81 mg total) by mouth daily.     Budeson-Glycopyrrol-Formoterol (BREZTRI AEROSPHERE) 160-9-4.8 MCG/ACT AERO Inhale 2 puffs into the lungs in the morning and at bedtime. 1 each 6   buPROPion (WELLBUTRIN XL) 300 MG 24 hr tablet TAKE 1 TABLET BY MOUTH ONCE DAILY 30 tablet 10   Calcium Carb-Cholecalciferol (CALCIUM 500/D PO) Take 2 tablets by mouth daily. Mag and zinc added     desvenlafaxine (PRISTIQ) 100 MG 24 hr tablet TAKE 1 TABLET BY MOUTH EVERY MORNING 30 tablet 10   dexlansoprazole (DEXILANT) 60 MG capsule Take 1 capsule (60 mg total) by mouth daily. 90 capsule 3   ezetimibe (ZETIA) 10 MG tablet TAKE ONE TABLET BY MOUTH ONCE DAILY 90 tablet 3   fluticasone (FLONASE) 50 MCG/ACT nasal spray Place 2 sprays into both nostrils as needed for allergies or rhinitis. 16 g 3   gabapentin (NEURONTIN)  300 MG capsule TAKE ONE CAPSULE BY MOUTH EVERYDAY AT BEDTIME 90 capsule 3   isosorbide mononitrate (IMDUR) 30 MG 24 hr tablet TAKE 1/2 TABLET BY MOUTH ONCE DAILY 45 tablet 3   LORazepam (ATIVAN) 0.5 MG tablet TAKE ONE (1) TABLET BY MOUTH TWICE DAILY AS NEEDED FOR ANXIETY *DO NOT DRIVE FOR 8 HOURS AFTER USING 60 tablet 0   Multiple Vitamins-Minerals (MULTIVITAMIN ADULT PO) Take 1 tablet by mouth daily.     rosuvastatin (CRESTOR) 40 MG tablet TAKE ONE TABLET BY MOUTH ONCE DAILY 90 tablet 2   triamcinolone (NASACORT) 55 MCG/ACT AERO nasal inhaler Place 1 spray into the nose daily. Start with 1 spray each side twice a day for 3 days, then reduce to daily. 1 each 2   azithromycin (ZITHROMAX) 250 MG tablet Take 2 tabs day 1, then 1 tab daily 6 each 0   buPROPion (WELLBUTRIN XL) 150 MG 24 hr tablet Take 1 tablet (150 mg total) by mouth daily. Take with 300 mg tablet for 450 mg total daily. 30 tablet 0   fluticasone (CUTIVATE) 0.05 % cream Apply 1 Application topically 2 (two) times  daily as needed (eczema). (Patient not taking: Reported on 09/19/2023)     ipratropium-albuterol (DUONEB) 0.5-2.5 (3) MG/3ML SOLN Take 3 mLs by nebulization every 6 (six) hours as needed. (Patient not taking: Reported on 09/19/2023) 360 mL 4   nitroGLYCERIN (NITROSTAT) 0.4 MG SL tablet Place 1 tablet (0.4 mg total) under the tongue every 5 (five) minutes as needed for chest pain (if pain does not resolve with 2 doses, call 911). (Patient not taking: Reported on 09/19/2023) 30 tablet 5   predniSONE (DELTASONE) 50 MG tablet Take 1 tablet daily x 5 days 5 tablet 0   valACYclovir (VALTREX) 1000 MG tablet TAKE 2 TABLETS BY MOUTH TWICE DAILY FOR 1 DAY AT FIRST SIGN OF COLD SORE *REFILL REQUEST* (Patient not taking: Reported on 09/19/2023) 30 tablet 10   No current facility-administered medications for this visit.   Allergies as of 09/19/2023 - Review Complete 09/19/2023  Allergen Reaction Noted   Codeine sulfate Itching and Rash 04/18/2011   Hydrocodone-acetaminophen Itching and Rash 04/18/2011   Family History  Problem Relation Age of Onset   Coronary artery disease Other    Heart disease Other        6 stents   Heart disease Mother    COPD Father        smoked   Heart disease Father    Rheum arthritis Father    Heart disease Maternal Uncle    Breast cancer Daughter        currently 15   Asthma Son        as a child    Breast cancer Daughter 42       currently 35; PALB2 mutation   Cancer Other        very distant paternal cousin; pancreatic ca   Cirrhosis Sister    Stroke Brother    Colon cancer Neg Hx    Colon polyps Neg Hx    Review of Systems:    Constitutional: No weight loss, fever, chills, weakness or fatigue HEENT: Eyes: No change in vision               Ears, Nose, Throat:  No change in hearing or congestion Skin: No rash or itching Cardiovascular: No chest pain, chest pressure or palpitations   Respiratory: No SOB or cough Gastrointestinal: See HPI and otherwise  negative Genitourinary:  No dysuria or change in urinary frequency Neurological: No headache, dizziness or syncope Musculoskeletal: No new muscle or joint pain Hematologic: No bleeding or bruising Psychiatric: No history of depression or anxiety   Physical Exam:  Vital signs: BP 110/72   Pulse 77   Ht 5' 2.5" (1.588 m)   Wt 126 lb (57.2 kg)   BMI 22.68 kg/m   Constitutional: Pleasant Caucasian female appears to be in NAD, Well developed, Well nourished, alert and cooperative Neck:  Supple Throat: Oral cavity and pharynx without inflammation, swelling or lesion.  Respiratory: Respirations even and unlabored. Lungs clear to auscultation bilaterally.   No wheezes, crackles, or rhonchi.  Cardiovascular: Normal S1, S2. Regular rate and rhythm. No peripheral edema, cyanosis or pallor.  Gastrointestinal:  Soft, nondistended, nontender. No rebound or guarding. Normal bowel sounds. No appreciable masses or hepatomegaly. Rectal:  Not performed.  Neurologic:  Alert and  oriented x4;  grossly normal neurologically.  Skin:   Dry and intact without significant lesions or rashes. Psychiatric: Oriented to person, place and time. Demonstrates good judgement and reason without abnormal affect or behaviors.  RELEVANT LABS AND IMAGING: CBC    Latest Ref Rng & Units 05/28/2023   11:28 AM 01/09/2023    8:51 AM 12/14/2022    3:14 PM  CBC  WBC 4.0 - 10.5 K/uL 4.7  4.0  4.1   Hemoglobin 12.0 - 15.0 g/dL 32.4  40.1  02.7   Hematocrit 36.0 - 46.0 % 37.6  33.3  35.4   Platelets 150.0 - 400.0 K/uL 168.0  152.0  170     CMP     Latest Ref Rng & Units 05/28/2023   11:28 AM 12/14/2022    3:14 PM 08/29/2022    4:04 AM  CMP  Glucose 70 - 99 mg/dL 253  87  664   BUN 6 - 23 mg/dL 11  11  9    Creatinine 0.40 - 1.20 mg/dL 4.03  4.74  2.59   Sodium 135 - 145 mEq/L 138  139  137   Potassium 3.5 - 5.1 mEq/L 4.0  4.4  3.8   Chloride 96 - 112 mEq/L 101  103  103   CO2 19 - 32 mEq/L 31  27  28    Calcium 8.4 -  10.5 mg/dL 8.9  8.6  8.7   Total Protein 6.1 - 8.1 g/dL  5.9    Total Bilirubin 0.2 - 1.2 mg/dL  0.3    AST 10 - 35 U/L  31    ALT 6 - 29 U/L  23      Lab Results  Component Value Date   TSH 0.46 01/09/2023    07/01/23 Fecal occult positive 08/06/23 echo- Left ventricular ejection fraction, by estimation, is 60 to 65%.  10/14/17 EGD Impression:  - Normal larynx.  - Normal esophagus.  - Normal stomach.  - Normal examined duodenum.  - No specimens collected.  07/14/2014 colon, recall 07/2024 ENDOSCOPIC IMPRESSION: Normal colonoscopy  Assessment: Encounter Diagnoses  Name Primary?   Nausea and vomiting, unspecified vomiting type Yes   Gastroesophageal reflux disease, unspecified whether esophagitis present    Positive fecal occult blood test    Chronic idiopathic constipation    Anemia, unspecified type    Abdominal discomfort    Fecal smearing     73 year old female patient who presents with positive heme occult and borderline anemia Hgb 10.7(5/24) --> 12 (9/24). I will recheck her CBC today. She is currently experiencing upper  GI symptoms with nausea and vomiting will go ahead and schedule upper GI endoscopy to rule out gastritis, PUD, or esophagitis. She is on a low dose ASA. We will also schedule colonoscopy to evaluate for source of bleeding. For the chronic constipation I will have her start Miralax po daily and incorporate more daily fiber into her diet. Her symptoms of stool incontinence are most consistent with overflow incontinence. If they do not improve with adding laxative and daily fiber we discussed pelvic floor therapy as an alternative option in the future.   Plan: - Continue Dexilant 60 mg po daily  - Add Miralax po daily -Increase overall fiber intake, Citrucel 1 tsp po daily. -Recheck CBC today. -Send Reglan 5mg  (2 doses) pre procedures to prevent nausea/vomiting. -Schedule Colonoscopy and EGD in [facility] with Dr. Myrtie Neither. -The risks and benefits of EGD with  possible biopsies and esophageal dilation were discussed with the patient who agrees to proceed. -The risks and benefits of colonoscopy with possible polypectomy / biopsies were discussed and the patient agrees to proceed.   Thank you for the courtesy of this consult. Please call me with any questions or concerns.   Deanna May, FNP-C [facility] 09/19/2023, 9:52 AM  The APP and I saw this patient together, with the APP acting as my scribe.  I have reviewed the above documentation for accuracy and completeness and I agree with the above documentation.  Cherylanne Ardelean L. Myrtie Neither, MD   Cc: Shelva Majestic, MD

## 2023-09-19 ENCOUNTER — Ambulatory Visit: Payer: Medicare HMO | Admitting: Gastroenterology

## 2023-09-19 ENCOUNTER — Telehealth (INDEPENDENT_AMBULATORY_CARE_PROVIDER_SITE_OTHER): Payer: Self-pay | Admitting: Otolaryngology

## 2023-09-19 ENCOUNTER — Encounter: Payer: Self-pay | Admitting: Gastroenterology

## 2023-09-19 ENCOUNTER — Other Ambulatory Visit (INDEPENDENT_AMBULATORY_CARE_PROVIDER_SITE_OTHER): Payer: Medicare HMO

## 2023-09-19 VITALS — BP 110/72 | HR 77 | Ht 62.5 in | Wt 126.0 lb

## 2023-09-19 DIAGNOSIS — K5904 Chronic idiopathic constipation: Secondary | ICD-10-CM | POA: Diagnosis not present

## 2023-09-19 DIAGNOSIS — K219 Gastro-esophageal reflux disease without esophagitis: Secondary | ICD-10-CM

## 2023-09-19 DIAGNOSIS — D649 Anemia, unspecified: Secondary | ICD-10-CM

## 2023-09-19 DIAGNOSIS — R109 Unspecified abdominal pain: Secondary | ICD-10-CM

## 2023-09-19 DIAGNOSIS — R112 Nausea with vomiting, unspecified: Secondary | ICD-10-CM | POA: Diagnosis not present

## 2023-09-19 DIAGNOSIS — R195 Other fecal abnormalities: Secondary | ICD-10-CM

## 2023-09-19 DIAGNOSIS — R151 Fecal smearing: Secondary | ICD-10-CM

## 2023-09-19 LAB — CBC WITH DIFFERENTIAL/PLATELET
Basophils Absolute: 0 10*3/uL (ref 0.0–0.1)
Basophils Relative: 0.6 % (ref 0.0–3.0)
Eosinophils Absolute: 0.1 10*3/uL (ref 0.0–0.7)
Eosinophils Relative: 1.3 % (ref 0.0–5.0)
HCT: 36.4 % (ref 36.0–46.0)
Hemoglobin: 11.6 g/dL — ABNORMAL LOW (ref 12.0–15.0)
Lymphocytes Relative: 16 % (ref 12.0–46.0)
Lymphs Abs: 0.8 10*3/uL (ref 0.7–4.0)
MCHC: 31.9 g/dL (ref 30.0–36.0)
MCV: 91.9 fL (ref 78.0–100.0)
Monocytes Absolute: 0.5 10*3/uL (ref 0.1–1.0)
Monocytes Relative: 11.2 % (ref 3.0–12.0)
Neutro Abs: 3.4 10*3/uL (ref 1.4–7.7)
Neutrophils Relative %: 70.9 % (ref 43.0–77.0)
Platelets: 160 10*3/uL (ref 150.0–400.0)
RBC: 3.96 Mil/uL (ref 3.87–5.11)
RDW: 14.7 % (ref 11.5–15.5)
WBC: 4.8 10*3/uL (ref 4.0–10.5)

## 2023-09-19 MED ORDER — NA SULFATE-K SULFATE-MG SULF 17.5-3.13-1.6 GM/177ML PO SOLN: 1.0000 | Freq: Once | ORAL | 0 refills | Status: AC

## 2023-09-19 MED ORDER — METOCLOPRAMIDE HCL 5 MG PO TABS: 5.0000 mg | ORAL_TABLET | Freq: Once | ORAL | 0 refills | Status: DC

## 2023-09-19 NOTE — Patient Instructions (Addendum)
Your provider has requested that you go to the basement level for lab work before leaving today. Press "B" on the elevator. The lab is located at the first door on the left as you exit the elevator.  You have been scheduled for a colonoscopy. Please follow written instructions given to you at your visit today.   Please pick up your prep supplies at the pharmacy within the next 1-3 days.  If you use inhalers (even only as needed), please bring them with you on the day of your procedure.  DO NOT TAKE 7 DAYS PRIOR TO TEST- Trulicity (dulaglutide) Ozempic, Wegovy (semaglutide) Mounjaro (tirzepatide) Bydureon Bcise (exanatide extended release)  DO NOT TAKE 1 DAY PRIOR TO YOUR TEST Rybelsus (semaglutide) Adlyxin (lixisenatide) Victoza (liraglutide) Byetta (exanatide) ___________________________________________________________________________  Please start Miralax and Citrucel daily   CONTINUE DEXILANT    _______________________________________________________  If your blood pressure at your visit was 140/90 or greater, please contact your primary care physician to follow up on this.  _______________________________________________________  If you are age 61 or older, your body mass index should be between 23-30. Your Body mass index is 22.68 kg/m. If this is out of the aforementioned range listed, please consider follow up with your Primary Care Provider.  If you are age 72 or younger, your body mass index should be between 19-25. Your Body mass index is 22.68 kg/m. If this is out of the aformentioned range listed, please consider follow up with your Primary Care Provider.   ________________________________________________________  The [facility] providers would like to encourage you to use [facility] to communicate with providers for non-urgent requests or questions.  Due to long hold times on the telephone, sending your provider a message by [facility] may be a faster and more efficient  way to get a response.  Please allow 48 business hours for a response.  Please remember that this is for non-urgent requests.  _______________________________________________________ It was a pleasure to see you today!  Thank you for trusting me with your gastrointestinal care!

## 2023-09-19 NOTE — Telephone Encounter (Signed)
Reminder Call: Date: 09/20/2023 Status: Sch  Time: 10:50 AM [id number] [place], [place] [address]  Left Voicemail

## 2023-09-20 ENCOUNTER — Ambulatory Visit (INDEPENDENT_AMBULATORY_CARE_PROVIDER_SITE_OTHER): Payer: Medicare HMO | Admitting: Family Medicine

## 2023-09-20 ENCOUNTER — Encounter: Payer: Self-pay | Admitting: Family Medicine

## 2023-09-20 ENCOUNTER — Ambulatory Visit (INDEPENDENT_AMBULATORY_CARE_PROVIDER_SITE_OTHER): Payer: Medicare HMO | Admitting: Otolaryngology

## 2023-09-20 VITALS — BP 120/64 | HR 74 | Temp 97.3°F | Ht 61.5 in | Wt 126.2 lb

## 2023-09-20 VITALS — BP 134/74 | Ht 61.5 in | Wt 124.0 lb

## 2023-09-20 DIAGNOSIS — J342 Deviated nasal septum: Secondary | ICD-10-CM

## 2023-09-20 DIAGNOSIS — R7303 Prediabetes: Secondary | ICD-10-CM | POA: Diagnosis not present

## 2023-09-20 DIAGNOSIS — J310 Chronic rhinitis: Secondary | ICD-10-CM

## 2023-09-20 DIAGNOSIS — R0982 Postnasal drip: Secondary | ICD-10-CM

## 2023-09-20 DIAGNOSIS — J449 Chronic obstructive pulmonary disease, unspecified: Secondary | ICD-10-CM | POA: Diagnosis not present

## 2023-09-20 DIAGNOSIS — F3341 Major depressive disorder, recurrent, in partial remission: Secondary | ICD-10-CM

## 2023-09-20 DIAGNOSIS — J343 Hypertrophy of nasal turbinates: Secondary | ICD-10-CM

## 2023-09-20 DIAGNOSIS — J301 Allergic rhinitis due to pollen: Secondary | ICD-10-CM

## 2023-09-20 DIAGNOSIS — R0981 Nasal congestion: Secondary | ICD-10-CM | POA: Diagnosis not present

## 2023-09-20 MED ORDER — BREZTRI AEROSPHERE 160-9-4.8 MCG/ACT IN AERO: 2.0000 | INHALATION_SPRAY | Freq: Two times a day (BID) | RESPIRATORY_TRACT | 11 refills | Status: AC

## 2023-09-20 MED ORDER — IPRATROPIUM BROMIDE 0.06 % NA SOLN: 2.0000 | Freq: Two times a day (BID) | NASAL | 10 refills | Status: AC | PRN

## 2023-09-20 MED ORDER — AZELASTINE HCL 0.1 % NA SOLN: 2.0000 | Freq: Two times a day (BID) | NASAL | 12 refills | Status: AC

## 2023-09-20 MED ORDER — LORAZEPAM 0.5 MG PO TABS: ORAL_TABLET | ORAL | 5 refills | Status: DC

## 2023-09-20 NOTE — Progress Notes (Signed)
Phone [phone] In person visit   Subjective:   Katherine Foley is a 73 y.o. 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    Priority: High   Depression 09/22/2015    Priority: High   Former smoker 09/22/2015    Priority: High   S/P AVR (aortic valve replacement) 11/06/2013    Priority: High   Aortic stenosis 07/07/2010    Priority: High   Coronary artery disease involving native coronary artery with angina pectoris (HCC) 05/17/2010    Priority: High   COPD (chronic obstructive pulmonary disease) (HCC) 07/23/2007    Priority: High   Aortic atherosclerosis (HCC) 03/10/2020    Priority: Medium    Restless legs 08/25/2019    Priority: Medium    Hot flashes 03/22/2016    Priority: Medium    Diastolic dysfunction 05/31/2015    Priority: Medium    GERD (gastroesophageal reflux disease) 06/04/2014    Priority: Medium    Carotid stenosis 05/17/2010    Priority: Medium    HLD (hyperlipidemia) 08/29/2009    Priority: Medium    Essential hypertension 04/14/2007    Priority: Medium    Osteopenia 04/14/2007    Priority: Medium    Genetic testing 02/10/2018    Priority: Low   Family history of breast cancer     Priority: Low   Allergic rhinitis 09/22/2015    Priority: Low   Adenomatous colon polyp     Priority: Low   Herpes simplex virus (HSV) infection 01/16/2010    Priority: Low   Recurrent major depressive disorder, in partial remission (HCC) 09/06/2022   Influenza A 08/28/2022   COVID-19 07/31/2022   Hypokalemia 07/31/2022   Anemia 07/31/2022   Coronary artery disease involving native coronary artery of native heart without angina pectoris 07/23/2022   Precordial chest pain 11/06/2013     Medications- reviewed and updated Current Outpatient Medications  Medication Sig Dispense Refill   acetaminophen (TYLENOL) 650 MG CR tablet Take 1,300 mg by mouth every 8 (eight) hours as needed for pain.     albuterol (VENTOLIN HFA) 108 (90 Base) MCG/ACT inhaler Inhale 2 puffs into the lungs every 6 (six) hours as needed for wheezing or shortness of breath. 8 g 3   aspirin EC 81 MG tablet Take 1 tablet (81 mg total) by mouth daily.     azelastine (ASTELIN) 0.1 % nasal spray Place 2 sprays into both nostrils 2 (two) times daily. Use in each nostril as directed 30 mL 12   buPROPion (WELLBUTRIN XL) 300 MG 24 hr tablet TAKE 1 TABLET BY MOUTH ONCE DAILY 30 tablet 10   Calcium Carb-Cholecalciferol (CALCIUM 500/D PO) Take 2 tablets by mouth daily. Mag and zinc added     desvenlafaxine (PRISTIQ) 100 MG 24 hr tablet TAKE 1 TABLET BY MOUTH EVERY MORNING 30 tablet 10   dexlansoprazole (DEXILANT) 60 MG capsule Take 1 capsule (60 mg total) by mouth daily. 90 capsule 3   ezetimibe (ZETIA) 10 MG tablet TAKE ONE TABLET BY MOUTH ONCE DAILY 90 tablet 3   fluticasone (CUTIVATE) 0.05 % cream Apply 1 Application topically 2 (two) times daily as needed (eczema).     fluticasone (FLONASE) 50 MCG/ACT nasal spray Place  2 sprays into both nostrils as needed for allergies or rhinitis. 16 g 3   gabapentin (NEURONTIN) 300 MG capsule TAKE ONE CAPSULE BY MOUTH EVERYDAY AT BEDTIME 90 capsule 3   ipratropium (ATROVENT) 0.06 % nasal spray Place 2 sprays into both nostrils 2 (two) times daily as needed for rhinitis (drainage). 15 mL 10   ipratropium-albuterol (DUONEB) 0.5-2.5 (3) MG/3ML SOLN Take 3 mLs by nebulization every 6 (six) hours as needed. 360 mL 4   isosorbide mononitrate (IMDUR) 30 MG 24 hr tablet TAKE 1/2 TABLET BY MOUTH ONCE DAILY 45 tablet 3   Multiple Vitamins-Minerals (MULTIVITAMIN ADULT PO) Take 1 tablet by mouth daily.     nitroGLYCERIN (NITROSTAT) 0.4 MG SL tablet Place 1 tablet (0.4 mg total) under the  tongue every 5 (five) minutes as needed for chest pain (if pain does not resolve with 2 doses, call 911). 30 tablet 5   rosuvastatin (CRESTOR) 40 MG tablet TAKE ONE TABLET BY MOUTH ONCE DAILY 90 tablet 2   triamcinolone (NASACORT) 55 MCG/ACT AERO nasal inhaler Place 1 spray into the nose daily. Start with 1 spray each side twice a day for 3 days, then reduce to daily. 1 each 2   valACYclovir (VALTREX) 1000 MG tablet TAKE 2 TABLETS BY MOUTH TWICE DAILY FOR 1 DAY AT FIRST SIGN OF COLD SORE *REFILL REQUEST* 30 tablet 10   Budeson-Glycopyrrol-Formoterol (BREZTRI AEROSPHERE) 160-9-4.8 MCG/ACT AERO Inhale 2 puffs into the lungs in the morning and at bedtime. 1 each 11   LORazepam (ATIVAN) 0.5 MG tablet TAKE ONE (1) TABLET BY MOUTH TWICE DAILY AS NEEDED FOR ANXIETY *DO NOT DRIVE FOR 8 HOURS AFTER USING 60 tablet 5   metoCLOPramide (REGLAN) 5 MG tablet Take 1 tablet (5 mg total) by mouth once for 1 dose. 2 tablet 0   No current facility-administered medications for this visit.     Objective:  BP 120/64   Pulse 74   Temp (!) 97.3 F (36.3 C)   Ht 5' 1.5" (1.562 m)   Wt 126 lb 3.2 oz (57.2 kg)   SpO2 96%   BMI 23.46 kg/m  Gen: NAD, resting comfortably CV: RRR no murmurs rubs or gallops Lungs: CTAB no crackles, wheeze, rhonchi Ext: no edema Skin: warm, dry     Assessment and Plan   # Anemia and positive fecal occult blood-seen by gastroenterology yesterday and plan for endoscopy and colonoscopy.  Hemoglobin was down to 11.6 g/dL with positive fecal occult blood (detected by colleague when I was out of the office)-she wonders if this could contribute to some fatigue and we discussed that certainly can   #hypertension S: medication: Amlodipine 5 mg, Imdur 30 mg BP Readings from Last 3 Encounters:  09/20/23 120/64  09/20/23 134/74  09/19/23 110/72  A/P: stable- continue current medicines   # Depression # Anxiety S:Medication: Pristiq 100 mg, Wellbutrin 300 mg extended release, lorazepam  0.5 mg twice daily as needed  -saw Dr. Jimmey Ralph and they had considered going up to 450 mg Wellbutrin- this was sent in but she later decided wanted to stay steady  -Contributing factors-also fianc of 22 years at age 76 in 80 and previously lost her best friend/daughter Angie in 2019 A/P: poor control.  phq9 at 16 today- slightly improved- with partial remission holidays were particularly challenging- prefers to hold steady on medications. We mentioned adjustments or psychiatry. She is still in therapy and wants to continue with that.  -states no thoughts of suicide- just that she is  ready to pass when the time comes with all she has been through -she has been    #COPD/former smoker-saw Dr. Kendrick Fries S: Medication:breztri 160 - 9 - 4 0.8 mcg - 2 puffs in the morning and at bedtime -had flare in December- doing much better after antibiotics and prednisone -is going to have some testing with Dr. Suszanne Conners for rhinorrhea A/P: Patient with recent COPD exacerbation but is feeling much better.  Encouraged her to continue the registry and follow-up if any worsening symptoms   # Hyperglycemia/insulin resistance/prediabetes- peak a1c 6.5 x1 S:  Medication: None      A/P: We went back over elevated A1c with Dr. Jimmey Ralph and the fact that if she has another number in this range we would need to diagnose diabetes-we discussed healthy eating/regular exercise and importance of checking this at next visit  Recommended follow up: Return in about 4 months (around 01/18/2024) for physical or sooner if needed.Schedule b4 you leave. Future Appointments  Date Time Provider Department Center  10/18/2023  2:30 PM Danis, Andreas Blower, MD [facility]  03/17/2024  3:20 PM Shelva Majestic, MD [facility]  03/20/2024 11:40 AM [facility] None  06/23/2024 11:20 AM [facility] ANNUAL WELLNESS VISIT 1 [facility]    Lab/Order associations:   ICD-10-CM   1. Chronic obstructive pulmonary disease, unspecified  COPD type (HCC)  J44.9     2. Recurrent major depressive disorder, in partial remission (HCC)  F33.41     3. Prediabetes  R73.03       Meds ordered this encounter  Medications   Budeson-Glycopyrrol-Formoterol (BREZTRI AEROSPHERE) 160-9-4.8 MCG/ACT AERO    Sig: Inhale 2 puffs into the lungs in the morning and at bedtime.    Dispense:  1 each    Refill:  11    Lot Number?:   [id number]   LORazepam (ATIVAN) 0.5 MG tablet    Sig: TAKE ONE (1) TABLET BY MOUTH TWICE DAILY AS NEEDED FOR ANXIETY *DO NOT DRIVE FOR 8 HOURS AFTER USING    Dispense:  60 tablet    Refill:  5    May fill monthly. Next delivery 09/27/23    Return precautions advised.  Tana Conch, MD

## 2023-09-20 NOTE — Patient Instructions (Addendum)
Glad you are feeling a little better and blood pressure and copd is better. Continue current medications and I will see you soon  Recommended follow up: Return in about 4 months (around 01/18/2024) for physical or sooner if needed.Schedule b4 you leave.

## 2023-09-20 NOTE — Progress Notes (Unsigned)
Patient ID: Katherine Foley, female   DOB: 11-01-1950, 73 y.o.   MRN: [id number]  Follow-up: Chronic nasal congestion, postnasal drainage  HPI: The patient is a 73 year old female who returns today for her follow-up evaluation.  The patient was previously seen for chronic nasal congestion and postnasal drainage.  At her last visit in July 2024, she was noted to have nasal mucosal congestion, nasal septal deviation, bilateral inferior turbinate hypertrophy, and postnasal drainage.  She was treated with Atrovent and nasal saline irrigation.  The patient returns today reporting improvement in her nasal congestion.  However, she continues to have frequent nasal drainage.  She denies any facial pain, fever, or visual change.  Exam: General: Communicates without difficulty, well nourished, no acute distress. Head: Normocephalic, no evidence injury, no tenderness, facial buttresses intact without stepoff. Face/sinus: No tenderness to palpation and percussion. Facial movement is normal and symmetric. Eyes: PERRL, EOMI. No scleral icterus, conjunctivae clear. Neuro: CN II exam reveals vision grossly intact.  No nystagmus at any point of gaze. Ears: Auricles well formed without lesions.  Ear canals are intact without mass or lesion.  No erythema or edema is appreciated.  The TMs are intact without fluid. Nose: External evaluation reveals normal support and skin without lesions.  Dorsum is intact.  Anterior rhinoscopy reveals congested mucosa over anterior aspect of inferior turbinates and deviated septum.  No purulence noted. Oral:  Oral cavity and oropharynx are intact, symmetric, without erythema or edema.  Mucosa is moist without lesions. Neck: Full range of motion without pain.  There is no significant lymphadenopathy.  No masses palpable.  Thyroid bed within normal limits to palpation.  Parotid glands and submandibular glands equal bilaterally without mass.  Trachea is midline. Neuro:  CN 2-12 grossly intact.    Assessment: 1.  Chronic rhinitis with nasal mucosal congestion, nasal septal deviation, and bilateral inferior turbinate hypertrophy. 2.  Chronic nasal drainage. 3.  No acute infection is noted today.  Plan: 1.  The physical exam findings are reviewed with the patient. 2.  The patient should continue with Atrovent nasal spray to treat the nasal drainage. 3.  In addition, she is started on azelastine nasal spray as needed. 4.  Allergy evaluation and treatment. 5.  The patient will return for reevaluation in 6 months, sooner if needed.

## 2023-09-22 DIAGNOSIS — R0982 Postnasal drip: Secondary | ICD-10-CM | POA: Insufficient documentation

## 2023-09-22 DIAGNOSIS — J342 Deviated nasal septum: Secondary | ICD-10-CM | POA: Insufficient documentation

## 2023-09-22 DIAGNOSIS — J343 Hypertrophy of nasal turbinates: Secondary | ICD-10-CM | POA: Insufficient documentation

## 2023-09-27 ENCOUNTER — Other Ambulatory Visit: Payer: Self-pay | Admitting: Family Medicine

## 2023-10-04 ENCOUNTER — Ambulatory Visit: Payer: Medicare HMO | Admitting: Allergy

## 2023-10-04 ENCOUNTER — Other Ambulatory Visit: Payer: Self-pay

## 2023-10-04 ENCOUNTER — Encounter: Payer: Self-pay | Admitting: Allergy

## 2023-10-04 VITALS — BP 114/70 | HR 73 | Temp 98.1°F | Resp 16 | Ht 60.0 in | Wt 122.1 lb

## 2023-10-04 DIAGNOSIS — J3089 Other allergic rhinitis: Secondary | ICD-10-CM | POA: Diagnosis not present

## 2023-10-04 DIAGNOSIS — L2089 Other atopic dermatitis: Secondary | ICD-10-CM

## 2023-10-04 DIAGNOSIS — J449 Chronic obstructive pulmonary disease, unspecified: Secondary | ICD-10-CM | POA: Diagnosis not present

## 2023-10-04 DIAGNOSIS — K219 Gastro-esophageal reflux disease without esophagitis: Secondary | ICD-10-CM | POA: Diagnosis not present

## 2023-10-04 NOTE — Patient Instructions (Addendum)
Rhinitis  Return for allergy skin testing. Will make additional recommendations based on results. Make sure you don't take any antihistamines for 3 days before the skin testing appointment. STOP azelastine nasal spray for 3 days before.  Okay to use Atrovent (ipratropium) nasal spray. Don't put any lotion on the back and arms on the day of testing.  Plan on being here for 30-60 minutes.   Continue nasal sprays as per Dr. Suszanne Conners for now.  COPD  Recommend to follow up with pulmonology for management.  Daily controller medication(s): continue Breztri 2 puffs twice a day rinse mouth afterwards. May use albuterol rescue inhaler 2 puffs or nebulizer every 4 to 6 hours as needed for shortness of breath, chest tightness, coughing, and wheezing. May use albuterol rescue inhaler 2 puffs 5 to 15 minutes prior to strenuous physical activities. Monitor frequency of use - if you need to use it more than twice per week on a consistent basis let us know.  Breathing control goals:  Full participation in all desired activities (may need albuterol before activity) Albuterol use two times or less a week on average (not counting use with activity) Cough interfering with sleep two times or less a month Oral steroids no more than once a year No hospitalizations   Reflux Continue lifestyle and dietary modifications. Continue Dexilant once day - nothing to eat or drink for 20-30 minutes afterwards.   Skin Continue management by derm. Keep track of rashes and take pictures. See below for proper skin care. Use fragrance free and dye free products. No dryer sheets or fabric softener.     Skin care recommendations  Bath time: Always use lukewarm water. AVOID very hot or cold water. Keep bathing time to 5-10 minutes. Do NOT use bubble bath. Use a mild soap and use just enough to wash the dirty areas. Do NOT scrub skin vigorously.  After bathing, pat dry your skin with a towel. Do NOT rub or scrub the  skin.  Moisturizers and prescriptions:  ALWAYS apply moisturizers immediately after bathing (within 3 minutes). This helps to lock-in moisture. Use the moisturizer several times a day over the whole body. Good summer moisturizers include: Aveeno, CeraVe, Cetaphil. Good winter moisturizers include: Aquaphor, Vaseline, Cerave, Cetaphil, Eucerin, Vanicream. When using moisturizers along with medications, the moisturizer should be applied about one hour after applying the medication to prevent diluting effect of the medication or moisturize around where you applied the medications. When not using medications, the moisturizer can be continued twice daily as maintenance.  Laundry and clothing: Avoid laundry products with added color or perfumes. Use unscented hypo-allergenic laundry products such as Tide free, Cheer free & gentle, and All free and clear.  If the skin still seems dry or sensitive, you can try double-rinsing the clothes. Avoid tight or scratchy clothing such as wool. Do not use fabric softeners or dyer sheets.

## 2023-10-04 NOTE — Progress Notes (Signed)
New Patient Note  RE: Katherine Foley MRN: [id number] DOB: 06-May-1951 Date of Office Visit: 10/04/2023  Consult requested by: Newman Pies, MD Primary care provider: Shelva Majestic, MD  Chief Complaint: Establish Care (Doesn't have any known allergies except to codeine. Has a runny nose constantly, sneezing, and coughing. Does have asthma-followed by a pulmonologist. )  History of Present Illness: I had the pleasure of seeing Katherine Foley for initial evaluation at the [facility] on 10/04/2023. She is a 73 y.o. female, who is referred here by Dr. Suszanne Conners (ENT) for the evaluation of allergic rhinitis.  Discussed the use of AI scribe software for clinical note transcription with the patient, who gave verbal consent to proceed.  She has experienced a runny nose and continuous nasal drip for approximately 30 years, accompanied by sinus pressure, postnasal drip, and occasional stuffiness. She also experiences sneezing, itchy nose, and itchy eyes. Symptoms are present year-round but worsen in the spring and fall. She has not undergone allergy testing previously.   She uses nasal sprays, including Atrovent (ipratropium) and azelastine, with limited relief. Atrovent is used two sprays per nostril one to two times per day, and azelastine one spray per nostril once a day. Despite these treatments, significant nasal symptoms persist, impacting her work at [employer], where she works part-time.  She has had two sinus infections in the past year and underwent sinus surgery in the past for a polyp in her ethmoid sinus, which included stent placement.   She has a history of COPD, for which she uses Breztri inhaler two puffs twice a day and an albuterol inhaler as needed, approximately three times a month. She was hospitalized in 2023 for pneumonia following COVID and flu infections. She has used steroids twice in the past year for breathing issues and sinus infections.  She experiences  eczema on her eyelids and around her ears, for which she uses fluticasone cream. She is allergic to codeine and hydrocodone, which cause itching. No food allergies or issues with insect stings.  She experiences heartburn and takes Dexilant for acid reflux, which usually helps. She is scheduled for a colonoscopy and endoscopy on October 18, 2023.      She reports symptoms of rhinorrhea, sinus pressure, sinus infections, PND, sneezing, itchy nose, itchy eyes. Symptoms have been going on for 30+ years. The symptoms are present all year around with worsening in spring and fall. Anosmia: no. Headache: sometimes. She has used Atrovent 0.6% 2 sprays per nostril 1-2 times per day, azelastine 1 spray per nostril once a day with minimal improvement in symptoms. Sinus infections: yes - 2. Previous work up includes: none. Previous ENT evaluation: yes, had sinus surgery in the past.  Previous sinus imaging: no. History of nasal polyps: yes. Last eye exam: had cataract surgery in May. History of reflux: takes Dexilant with some benefit.   09/20/2023 ENT visit: "Assessment: 1.  Chronic rhinitis with nasal mucosal congestion, nasal septal deviation, and bilateral inferior turbinate hypertrophy. 2.  Chronic nasal drainage. 3.  No acute infection is noted today.   Plan: 1.  The physical exam findings are reviewed with the patient. 2.  The patient should continue with Atrovent nasal spray to treat the nasal drainage. 3.  In addition, she is started on azelastine nasal spray as needed. 4.  Allergy evaluation and treatment. 5.  The patient will return for reevaluation in 6 months, sooner if needed."  Assessment and Plan: Katherine Foley is a 72 y.o.  female with: Other allergic rhinitis Persistent symptoms of runny nose, sneezing, and itchiness in the nose and eyes for approximately 30 years. Symptoms are year-round but worsen in spring and fall. Currently using Ipratropium and azelastine nasal sprays with minimal  relief. Return for allergy skin testing. Will make additional recommendations based on results. Make sure you don't take any antihistamines for 3 days before the skin testing appointment. STOP azelastine nasal spray for 3 days before.  Okay to use Atrovent (ipratropium) nasal spray. Continue nasal sprays as per Dr. Suszanne Conners for now.  Chronic obstructive pulmonary disease, unspecified COPD type (HCC) History of COPD with past hospitalization for COVID and flu. Currently using Breztri inhaler and Albuterol as needed. Follows with pulm. Today's spirometry showed moderate obstruction with minimal improvement in FEV1 post bronchodilator treatment. Clinically feeling unchanged.  Recommend to follow up with pulmonology for management.  Daily controller medication(s): continue Breztri 2 puffs twice a day rinse mouth afterwards. May use albuterol rescue inhaler 2 puffs or nebulizer every 4 to 6 hours as needed for shortness of breath, chest tightness, coughing, and wheezing. May use albuterol rescue inhaler 2 puffs 5 to 15 minutes prior to strenuous physical activities. Monitor frequency of use - if you need to use it more than twice per week on a consistent basis let us know.   Gastroesophageal reflux disease, unspecified whether esophagitis present Continue lifestyle and dietary modifications. Continue Dexilant once day - nothing to eat or drink for 20-30 minutes afterwards.   Other atopic dermatitis Eczema on eyelids and around ears, currently managed with Fluticasone cream. Managed by derm. Keep track of rashes and take pictures. See below for proper skin care. Use fragrance free and dye free products. No dryer sheets or fabric softener.    Return for Skin testing.  No orders of the defined types were placed in this encounter.  Lab Orders  No laboratory test(s) ordered today    Other allergy screening: COPD Followed by pulm in the past. Currently on Breztri 2 puffs twice a day and uses  albuterol 3 times per month. Hospitalized in 2023 for flu.  Used prednisone 2 times the past 12 months.   Food allergy: no Medication allergy: yes Hymenoptera allergy: no Urticaria: no Eczema: yes - on eyelids. Managed by dermatology.   History of recurrent infections suggestive of immunodeficency: no  Diagnostics: Spirometry:  Tracings reviewed. Her effort: It was hard to get consistent efforts and there is a question as to whether this reflects a maximal maneuver. FVC: 3.71L FEV1: 0.98L, 54% predicted FEV1/FVC ratio: 26% Interpretation: Spirometry consistent with moderate obstructive disease with 2% improvement in FEV1 post bronchodilator treatment. Clinically feeling unchanged.   Please see scanned spirometry results for details.  Results discussed with patient/family.   Past Medical History: Patient Active Problem List   Diagnosis Date Noted   Deviated nasal septum 09/22/2023   Hypertrophy of nasal turbinates 09/22/2023   Postnasal drip 09/22/2023   Recurrent major depressive disorder, in partial remission (HCC) 09/06/2022   Influenza A 08/28/2022   COVID-19 07/31/2022   Hypokalemia 07/31/2022   Anemia 07/31/2022   Coronary artery disease involving native coronary artery of native heart without angina pectoris 07/23/2022   Aortic atherosclerosis (HCC) 03/10/2020   Restless legs 08/25/2019   H/O parotidectomy 04/28/2019   Genetic testing 02/10/2018   Family history of breast cancer    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   Hot flashes 03/22/2016   Allergic rhinitis 09/22/2015   Depression 09/22/2015   Former  smoker 09/22/2015   Adenomatous colon polyp    Diastolic dysfunction 05/31/2015   GERD (gastroesophageal reflux disease) 06/04/2014   S/P AVR (aortic valve replacement) 11/06/2013   Precordial chest pain 11/06/2013   Aortic stenosis 07/07/2010   Coronary artery disease involving native coronary artery with angina pectoris (HCC) 05/17/2010   Carotid stenosis  05/17/2010   Herpes simplex virus (HSV) infection 01/16/2010   HLD (hyperlipidemia) 08/29/2009   COPD (chronic obstructive pulmonary disease) (HCC) 07/23/2007   Essential hypertension 04/14/2007   Osteopenia 04/14/2007   Past Medical History:  Diagnosis Date   Adenomatous colon polyp    Angio-edema    Anxiety    Anxiety/Depression 02/15/2009   Aortic stenosis s/p bioprosthetic AVR 07/07/2010   Echo 9/19:  Mild conc LVH, EF 60-65, no RWMA, Gr 1 DD, AVR ok (mean 10, peak 21), trivial MR, normal RVSF, mild TR // Echo 10/22: EF 60-65, no RWMA, normal RVSF, AVR with increased gradients (mean 24 increased from 10 in 2019) // Echocardiogram 10/23: EF 60-65, no RWMA, mildly reduced RVSF, AVR w mean gradient 17.3 mmHg   Asthma    Barrett's esophagus    04/2007; 2000   Carotid stenosis 05/17/2010   a. dopplers 11/11: RICA 40-50%; LICA 0-39% (follow up due 11/12)   COPD 07/23/2007   Coronary Artery Disease 05/17/2010   NSTEMI 8/11 - BMS to RCA;  cath 4/12: dLAD 50%, RI 80% (PCI), AVCFX 30%, pRCA 30%, stent ok, 40-50, then 50% dist RCA;  c. s/p Promus DES to RI 12/25/10;  d. 04/2011 CABG x 1 (LIMA->LAD) @ time of AVR // Cath in 9/19: RI and RCA stents ok; L-LAD atretic, mild disease in mLAD >> med Rx   Eczema    Family history of breast cancer    2 daughters; PALB2 mutation in family   Genetic testing 02/10/2018   PALB2 analysis at [facility] - Negative for familial mutation   GERD    H/O hiatal hernia    Hemorrhoid thrombosis    "had it lanced" (03/25/2013)   Herpes simplex without mention of complication 01/16/2010   History of blood transfusion    'w/OHS" (03/25/2013)   Hyperlipidemia 08/29/2009   Hypertension 04/14/2007   Migraine HAs 04/20/2008   Myocardial infarction [facility]) 2011   "during catherization" (03/25/2013)   OSTEOPENIA 04/14/2007   Ovarian cyst    Parotid mass    left   Pneumonia    "multiple times" (03/25/2013)   Recurrent upper respiratory infection (URI)    Urine,  incontinence, stress female    Past Surgical History: Past Surgical History:  Procedure Laterality Date   ADENOIDECTOMY     AORTIC VALVE REPLACEMENT  09/03/2010   APPENDECTOMY     BLEPHAROPLASTY Bilateral ~ 1998   CARDIAC CATHETERIZATION N/A 07/06/2016   Procedure: Left Heart Cath and Cors/Grafts Angiography;  Surgeon: Tonny Bollman, MD;  Location: [facility] INVASIVE CV LAB;  Service: Cardiovascular;  Laterality: N/A;   CHOLECYSTECTOMY N/A 03/25/2013   Procedure: LAPAROSCOPIC CHOLECYSTECTOMY WITH INTRAOPERATIVE CHOLANGIOGRAM;  Surgeon: Wilmon Arms. Corliss Skains, MD;  Location: [facility] OR;  Service: General;  Laterality: N/A;   CORONARY ANGIOGRAPHY N/A 05/26/2018   Procedure: CORONARY ANGIOGRAPHY (CATH LAB);  Surgeon: Corky Crafts, MD;  Location: [facility] INVASIVE CV LAB;  Service: Cardiovascular;  Laterality: N/A;   CORONARY ANGIOPLASTY WITH STENT PLACEMENT  09/03/2008   "I have 2 stents" (03/25/2013)   NASAL SINUS SURGERY  1997; 1998   "cust in maxillary sinus; put a stent in then removed it" (03/25/2013)  ORIF ANKLE FRACTURE Right 02/14/2021   Procedure: OPEN TREATMENT OF RIGHT TRIMALLEOLAR ANKLE FRACTURE WITHOUT POSTERIOR FIXATION, SYNDESMOSIS;  Surgeon: Terance Hart, MD;  Location: [facility];  Service: Orthopedics;  Laterality: Right;   OVARIAN CYST REMOVAL Right 09/03/2009   "size of a soccer ball" (03/25/2013)   PAROTIDECTOMY Left 04/28/2019   Procedure: LEFT TOTAL PAROTIDECTOMY;  Surgeon: Newman Pies, MD;  Location: [facility];  Service: ENT;  Laterality: Left;   RIGHT HEART CATH AND CORONARY ANGIOGRAPHY N/A 07/23/2022   Procedure: RIGHT HEART CATH AND CORONARY ANGIOGRAPHY;  Surgeon: Kathleene Hazel, MD;  Location: [facility] INVASIVE CV LAB;  Service: Cardiovascular;  Laterality: N/A;   thrombosed hemorrohid lanced     TONSILLECTOMY AND ADENOIDECTOMY     TOTAL ABDOMINAL HYSTERECTOMY     Medication List:  Current Outpatient Medications  Medication Sig Dispense  Refill   acetaminophen (TYLENOL) 650 MG CR tablet Take 1,300 mg by mouth every 8 (eight) hours as needed for pain.     albuterol (VENTOLIN HFA) 108 (90 Base) MCG/ACT inhaler Inhale 2 puffs into the lungs every 6 (six) hours as needed for wheezing or shortness of breath. 8 g 3   aspirin EC 81 MG tablet Take 1 tablet (81 mg total) by mouth daily.     azelastine (ASTELIN) 0.1 % nasal spray Place 2 sprays into both nostrils 2 (two) times daily. Use in each nostril as directed 30 mL 12   Budeson-Glycopyrrol-Formoterol (BREZTRI AEROSPHERE) 160-9-4.8 MCG/ACT AERO Inhale 2 puffs into the lungs in the morning and at bedtime. 1 each 11   buPROPion (WELLBUTRIN XL) 300 MG 24 hr tablet TAKE 1 TABLET BY MOUTH ONCE DAILY 30 tablet 10   Calcium Carb-Cholecalciferol (CALCIUM 500/D PO) Take 2 tablets by mouth daily. Mag and zinc added     carboxymethylcellulose (REFRESH PLUS) 0.5 % SOLN Place 1 drop into both eyes 3 (three) times daily as needed.     desvenlafaxine (PRISTIQ) 100 MG 24 hr tablet TAKE 1 TABLET BY MOUTH EVERY MORNING 30 tablet 10   dexlansoprazole (DEXILANT) 60 MG capsule Take 1 capsule (60 mg total) by mouth daily. 90 capsule 3   ezetimibe (ZETIA) 10 MG tablet TAKE ONE TABLET BY MOUTH ONCE DAILY 90 tablet 3   fluticasone (CUTIVATE) 0.05 % cream Apply 1 Application topically 2 (two) times daily as needed (eczema).     fluticasone (FLONASE) 50 MCG/ACT nasal spray Place 2 sprays into both nostrils as needed for allergies or rhinitis. 16 g 3   gabapentin (NEURONTIN) 300 MG capsule TAKE 1 CAPSULE BY MOUTH EVERY DAY AT BEDTIME 30 capsule 10   ipratropium (ATROVENT) 0.06 % nasal spray Place 2 sprays into both nostrils 2 (two) times daily as needed for rhinitis (drainage). 15 mL 10   ipratropium-albuterol (DUONEB) 0.5-2.5 (3) MG/3ML SOLN Take 3 mLs by nebulization every 6 (six) hours as needed. 360 mL 4   isosorbide mononitrate (IMDUR) 30 MG 24 hr tablet TAKE 1/2 TABLET BY MOUTH ONCE DAILY 45 tablet 3    LORazepam (ATIVAN) 0.5 MG tablet TAKE ONE (1) TABLET BY MOUTH TWICE DAILY AS NEEDED FOR ANXIETY *DO NOT DRIVE FOR 8 HOURS AFTER USING 60 tablet 5   Multiple Vitamins-Minerals (MULTIVITAMIN ADULT PO) Take 1 tablet by mouth daily.     nitroGLYCERIN (NITROSTAT) 0.4 MG SL tablet Place 1 tablet (0.4 mg total) under the tongue every 5 (five) minutes as needed for chest pain (if pain does not resolve with 2 doses,  call 911). 30 tablet 5   rosuvastatin (CRESTOR) 40 MG tablet TAKE 1 TABLET BY MOUTH ONCE DAILY 30 tablet 10   triamcinolone (NASACORT) 55 MCG/ACT AERO nasal inhaler Place 1 spray into the nose daily. Start with 1 spray each side twice a day for 3 days, then reduce to daily. 1 each 2   valACYclovir (VALTREX) 1000 MG tablet TAKE 2 TABLETS BY MOUTH TWICE DAILY FOR 1 DAY AT FIRST SIGN OF COLD SORE *REFILL REQUEST* 30 tablet 10   metoCLOPramide (REGLAN) 5 MG tablet Take 1 tablet (5 mg total) by mouth once for 1 dose. 2 tablet 0   No current facility-administered medications for this visit.   Allergies: Allergies  Allergen Reactions   Codeine Sulfate Itching and Rash   Hydrocodone-Acetaminophen Itching and Rash   Social History: Social History   Socioeconomic History   Marital status: Single    Spouse name: Not on file   Number of children: 3   Years of education: Not on file   Highest education level: Associate degree: occupational, Scientist, product/process development, or vocational program  Occupational History   Occupation: Holiday representative: [employer]   Occupation: retired   Occupation: Clinical biochemist  Tobacco Use   Smoking status: Former    Current packs/day: 0.00    Average packs/day: 1.5 packs/day for 44.0 years (66.0 ttl pk-yrs)    Types: Cigarettes    Start date: 09/03/1961    Quit date: 09/03/2005    Years since quitting: 18.0   Smokeless tobacco: Never  Vaping Use   Vaping status: Never Used  Substance and Sexual Activity   Alcohol use: Not Currently    Alcohol/week: 0.0 standard drinks of  alcohol    Comment: social   Drug use: No   Sexual activity: Not Currently    Birth control/protection: Post-menopausal, Surgical  Other Topics Concern   Not on file  Social History Narrative   Lost fiancee of 22 years in 2022. 3 children (2 living). 6 grandkids. 2 greatgrandkids. Keeps greatgrandson on tuesdays- loves her so much.       Retired from [employer] (parttime- Dec 12)   Faith is very important to her      Hobbies: reading, cross stich, sew. Wants to learn to knit.       Social Drivers of Health   Financial Resource Strain: High Risk (09/02/2023)   Overall Financial Resource Strain (CARDIA)    Difficulty of Paying Living Expenses: Hard  Food Insecurity: Food Insecurity Present (09/02/2023)   Hunger Vital Sign    Worried About Running Out of Food in the Last Year: Sometimes true    Ran Out of Food in the Last Year: Sometimes true  Transportation Needs: No Transportation Needs (09/02/2023)   PRAPARE - Administrator, Civil Service (Medical): No    Lack of Transportation (Non-Medical): No  Physical Activity: Sufficiently Active (09/02/2023)   Exercise Vital Sign    Days of Exercise per Week: 7 days    Minutes of Exercise per Session: 40 min  Stress: Stress Concern Present (09/02/2023)   [employer] of Occupational Health - Occupational Stress Questionnaire    Feeling of Stress : To some extent  Social Connections: Moderately Integrated (09/02/2023)   Social Connection and Isolation Panel [NHANES]    Frequency of Communication with Friends and Family: More than three times a week    Frequency of Social Gatherings with Friends and Family: Once a week    Attends Religious Services: More than  4 times per year    Active Member of Clubs or Organizations: Yes    Attends Banker Meetings: More than 4 times per year    Marital Status: Divorced   Lives in an apartment. Smoking: quit in 2000 Occupation: Veterinary surgeon History: Water  Damage/mildew in the house: not sure Carpet in the family room: yes Carpet in the bedroom: yes Heating: electric Cooling: central Pet: yes 1 dog x 5 yrs  Family History: Family History  Problem Relation Age of Onset   Heart disease Mother    COPD Father        smoked   Heart disease Father    Rheum arthritis Father    Allergic rhinitis Sister    Cirrhosis Sister    Stroke Brother    Heart disease Maternal Uncle    Breast cancer Daughter        currently 49   Breast cancer Daughter 45       currently 74; PALB2 mutation   Asthma Son        as a child    Coronary artery disease Other    Heart disease Other        6 stents   Cancer Other        very distant paternal cousin; pancreatic ca   Colon cancer Neg Hx    Colon polyps Neg Hx    Review of Systems  Constitutional:  Negative for appetite change, chills, fever and unexpected weight change.  HENT:  Positive for congestion, postnasal drip and rhinorrhea.   Eyes:  Negative for itching.  Respiratory:  Positive for cough, chest tightness, shortness of breath and wheezing.   Cardiovascular:  Negative for chest pain.  Gastrointestinal:  Negative for abdominal pain.  Genitourinary:  Negative for difficulty urinating.  Skin:  Negative for rash.  Neurological:  Positive for headaches.    Objective: BP 114/70 (BP Location: Left Arm, Patient Position: Sitting, Cuff Size: Normal)   Pulse 73   Temp 98.1 F (36.7 C) (Temporal)   Resp 16   Ht 5' (1.524 m)   Wt 122 lb 1.6 oz (55.4 kg)   SpO2 93%   BMI 23.85 kg/m  Body mass index is 23.85 kg/m. Physical Exam Vitals and nursing note reviewed.  Constitutional:      Appearance: Normal appearance. She is well-developed.  HENT:     Head: Normocephalic and atraumatic.     Right Ear: Tympanic membrane and external ear normal.     Left Ear: Tympanic membrane and external ear normal.     Nose: Nose normal.     Mouth/Throat:     Mouth: Mucous membranes are moist.     Pharynx:  Oropharynx is clear.  Eyes:     Conjunctiva/sclera: Conjunctivae normal.  Cardiovascular:     Rate and Rhythm: Normal rate and regular rhythm.     Heart sounds: Normal heart sounds. No murmur heard.    No friction rub. No gallop.  Pulmonary:     Effort: Pulmonary effort is normal.     Breath sounds: Normal breath sounds. No wheezing, rhonchi or rales.  Musculoskeletal:     Cervical back: Neck supple.  Skin:    General: Skin is warm.     Findings: No rash.  Neurological:     Mental Status: She is alert and oriented to person, place, and time.  Psychiatric:        Behavior: Behavior normal.   The plan was reviewed with  the patient/family, and all questions/concerned were addressed.  It was my pleasure to see Katherine Foley today and participate in her care. Please feel free to contact me with any questions or concerns.  Sincerely,  Wyline Mood, DO Allergy & Immunology  [facility] office: [phone] [facility] office: [phone]

## 2023-10-11 ENCOUNTER — Encounter: Payer: Self-pay | Admitting: Gastroenterology

## 2023-10-15 NOTE — Progress Notes (Unsigned)
Skin testing note  RE: Katherine Foley MRN: [id number] DOB: Oct 27, 1950 Date of Office Visit: 10/16/2023  Referring provider: Shelva Majestic, MD Primary care provider: Shelva Majestic, MD  Chief Complaint: skin testing  History of Present Illness: I had the pleasure of seeing Katherine Foley for a [facility]  on 10/16/20[facility]. She is a 73 y.o. female, who is being followed for allergic rhinitis, COPD, GERD, atopic dermatitis. Her previous allergy office visit was on 10/04/2023 with Dr. Selena Batten. Today is a skin testing visit.   Discussed the use of AI scribe software for clinical note transcription with the patient, who gave verbal consent to proceed.     She has been experiencing a chronic runny nose. She uses azelastine and Atrovent nasal sprays as previously prescribed.  She works at [employer], which she mentions can make managing her symptoms more challenging.     Assessment and Plan: Katherine Foley is a 73 y.o. female with: Chronic rhinitis Past history - persistent symptoms of runny nose, sneezing, and itchiness in the nose and eyes for approximately 30 years. Symptoms are year-round but worsen in spring and fall. Currently using Ipratropium and azelastine nasal sprays with minimal relief. Today's skin testing negative to indoor/outdoor allergens. You most likely have chronic non-allergic rhinitis.  Continue nasal sprays as per Dr. Suszanne Conners. Atrovent, azelastine Follow up with Dr. Suszanne Conners as scheduled.    Chronic obstructive pulmonary disease, unspecified COPD type (HCC) Past history - COPD with past hospitalization for COVID and flu. Currently using Breztri inhaler and Albuterol as needed. Follows with pulm. 2025 spirometry showed moderate obstruction with minimal improvement in FEV1 post bronchodilator treatment. Clinically feeling unchanged.  Recommend to follow up with pulmonology for management.  Daily controller medication(s): continue Breztri  2 puffs twice a day rinse mouth afterwards. May use albuterol rescue inhaler 2 puffs or nebulizer every 4 to 6 hours as needed for shortness of breath, chest tightness, coughing, and wheezing. May use albuterol rescue inhaler 2 puffs 5 to 15 minutes prior to strenuous physical activities. Monitor frequency of use - if you need to use it more than twice per week on a consistent basis let us know.    Gastroesophageal reflux disease, unspecified whether esophagitis present Continue lifestyle and dietary modifications. Continue Dexilant once day - nothing to eat or drink for 20-30 minutes afterwards.    Other atopic dermatitis Past history - eczema on eyelids and around ears, currently managed with Fluticasone cream. Managed by derm. Continue management by derm. Keep track of rashes and take pictures. Continue proper skin care.  Return if symptoms worsen or fail to improve.  No orders of the defined types were placed in this encounter.  Lab Orders  No laboratory test(s) ordered today    Diagnostics: Skin Testing: Environmental allergy panel. Today's skin testing negative to indoor/outdoor allergens. Results discussed with patient/family.  Airborne Adult Perc - 10/16/23 0859     Time Antigen Placed 0859    Allergen Manufacturer Waynette Buttery    Location Back    Number of Test 55    Panel 1 Select    1. Control-Buffer 50% Glycerol Negative    2. Control-Histamine 3+    3. Bahia Negative    4. French Southern Territories Negative    5. Johnson Negative    6. Kentucky Blue Negative    7. Meadow Fescue Negative    8. Perennial Rye Negative    9. Timothy Negative    10.  Ragweed Mix Negative    11. Cocklebur Negative    12. Plantain,  English Negative    13. Baccharis Negative    14. Dog Fennel Negative    15. Russian Thistle Negative    16. Lamb's Quarters Negative    17. Sheep Sorrell Negative    18. Rough Pigweed Negative    19. Marsh Elder, Rough Negative    20. Mugwort, Common Negative    21. Box,  Elder Negative    22. Cedar, red Negative    23. Sweet Gum Negative    24. Pecan Pollen Negative    25. Pine Mix Negative    26. Walnut, Black Pollen Negative    27. Red Mulberry Negative    28. Ash Mix Negative    29. Birch Mix Negative    30. Beech American Negative    31. Cottonwood, Guinea-Bissau Negative    32. Hickory, White Negative    33. Maple Mix Negative    34. Oak, Guinea-Bissau Mix Negative    35. Sycamore Eastern Negative    36. Alternaria Alternata Negative    37. Cladosporium Herbarum Negative    38. Aspergillus Mix Negative    39. Penicillium Mix Negative    40. Bipolaris Sorokiniana (Helminthosporium) Negative    41. Drechslera Spicifera (Curvularia) Negative    42. Mucor Plumbeus Negative    43. Fusarium Moniliforme Negative    44. Aureobasidium Pullulans (pullulara) Negative    45. Rhizopus Oryzae Negative    46. Botrytis Cinera Negative    47. Epicoccum Nigrum Negative    48. Phoma Betae Negative    49. Dust Mite Mix Negative    50. Cat Hair 10,000 BAU/ml Negative    51.  Dog Epithelia Negative    52. Mixed Feathers Negative    53. Horse Epithelia Negative    54. Cockroach, German Negative    55. Tobacco Leaf Negative             Intradermal - 10/16/23 0936     Time Antigen Placed 5366    Allergen Manufacturer Waynette Buttery    Location Arm    Number of Test 16    Control Negative    Bahia Negative    French Southern Territories Negative    Johnson Negative    7 Grass Negative    Ragweed Mix Negative    Weed Mix Negative    Tree Mix Negative    Mold 1 Negative    Mold 2 Negative    Mold 3 Negative    Mold 4 Negative    Mite Mix Negative    Cat Negative    Dog Negative    Cockroach Negative             Previous notes and tests were reviewed. The plan was reviewed with the patient/family, and all questions/concerned were addressed.  It was my pleasure to see Katherine Foley today and participate in her care. Please feel free to contact me with any questions or  concerns.  Sincerely,  Wyline Mood, DO Allergy & Immunology  [facility] office: [phone] [facility] office: [phone]

## 2023-10-16 ENCOUNTER — Ambulatory Visit: Payer: Medicare HMO | Admitting: Allergy

## 2023-10-16 ENCOUNTER — Encounter: Payer: Self-pay | Admitting: Allergy

## 2023-10-16 ENCOUNTER — Other Ambulatory Visit: Payer: Self-pay

## 2023-10-16 DIAGNOSIS — J310 Chronic rhinitis: Secondary | ICD-10-CM

## 2023-10-16 DIAGNOSIS — J449 Chronic obstructive pulmonary disease, unspecified: Secondary | ICD-10-CM | POA: Diagnosis not present

## 2023-10-16 DIAGNOSIS — L2089 Other atopic dermatitis: Secondary | ICD-10-CM

## 2023-10-16 DIAGNOSIS — J3089 Other allergic rhinitis: Secondary | ICD-10-CM

## 2023-10-16 DIAGNOSIS — K219 Gastro-esophageal reflux disease without esophagitis: Secondary | ICD-10-CM

## 2023-10-16 NOTE — Patient Instructions (Addendum)
Today's skin testing negative to indoor/outdoor allergens.  Results given.  You most likely have chronic non-allergic rhinitis.  Continue nasal sprays as per Dr. Suszanne Conners for now. Follow up with Dr. Suszanne Conners as scheduled.   COPD  Recommend to follow up with pulmonology for management.  Daily controller medication(s): continue Breztri 2 puffs twice a day rinse mouth afterwards. May use albuterol rescue inhaler 2 puffs or nebulizer every 4 to 6 hours as needed for shortness of breath, chest tightness, coughing, and wheezing. May use albuterol rescue inhaler 2 puffs 5 to 15 minutes prior to strenuous physical activities. Monitor frequency of use - if you need to use it more than twice per week on a consistent basis let us know.  Breathing control goals:  Full participation in all desired activities (may need albuterol before activity) Albuterol use two times or less a week on average (not counting use with activity) Cough interfering with sleep two times or less a month Oral steroids no more than once a year No hospitalizations   Reflux Continue lifestyle and dietary modifications. Continue Dexilant once day - nothing to eat or drink for 20-30 minutes afterwards.   Skin Continue management by derm. Keep track of rashes and take pictures. Continue proper skin care.  Follow up as needed.

## 2023-10-18 ENCOUNTER — Encounter: Payer: Self-pay | Admitting: Gastroenterology

## 2023-10-18 ENCOUNTER — Ambulatory Visit (AMBULATORY_SURGERY_CENTER): Payer: Medicare HMO | Admitting: Gastroenterology

## 2023-10-18 VITALS — BP 94/55 | HR 61 | Temp 97.2°F | Resp 13 | Ht 62.0 in | Wt 126.0 lb

## 2023-10-18 DIAGNOSIS — D122 Benign neoplasm of ascending colon: Secondary | ICD-10-CM

## 2023-10-18 DIAGNOSIS — K219 Gastro-esophageal reflux disease without esophagitis: Secondary | ICD-10-CM | POA: Diagnosis not present

## 2023-10-18 DIAGNOSIS — K5730 Diverticulosis of large intestine without perforation or abscess without bleeding: Secondary | ICD-10-CM | POA: Diagnosis not present

## 2023-10-18 DIAGNOSIS — K5909 Other constipation: Secondary | ICD-10-CM

## 2023-10-18 DIAGNOSIS — K5904 Chronic idiopathic constipation: Secondary | ICD-10-CM | POA: Diagnosis not present

## 2023-10-18 DIAGNOSIS — Z1211 Encounter for screening for malignant neoplasm of colon: Secondary | ICD-10-CM | POA: Diagnosis not present

## 2023-10-18 DIAGNOSIS — R112 Nausea with vomiting, unspecified: Secondary | ICD-10-CM | POA: Diagnosis not present

## 2023-10-18 DIAGNOSIS — D649 Anemia, unspecified: Secondary | ICD-10-CM

## 2023-10-18 DIAGNOSIS — D125 Benign neoplasm of sigmoid colon: Secondary | ICD-10-CM | POA: Diagnosis not present

## 2023-10-18 DIAGNOSIS — D123 Benign neoplasm of transverse colon: Secondary | ICD-10-CM

## 2023-10-18 DIAGNOSIS — R195 Other fecal abnormalities: Secondary | ICD-10-CM | POA: Diagnosis not present

## 2023-10-18 MED ORDER — PEG 3350-KCL-NA BICARB-NACL 420 G PO SOLR: 4000.0000 mL | Freq: Once | ORAL | 0 refills | Status: AC

## 2023-10-18 MED ORDER — SODIUM CHLORIDE 0.9 % IV SOLN: 500.0000 mL | Freq: Once | INTRAVENOUS | Status: DC

## 2023-10-18 NOTE — Progress Notes (Signed)
To pacu, VSS. Report to [email or web address].tb

## 2023-10-18 NOTE — Progress Notes (Signed)
Pt's states no medical or surgical changes since previsit or office visit.

## 2023-10-18 NOTE — Patient Instructions (Signed)
Handouts Provided:  Polyps  REPEAT Colonoscopy at next available appointment.  Please see scheduled procedure, instructions provided.  Use Miralax one capful in a glass of water daily, increasing if needed to twice daily.   YOU HAD AN ENDOSCOPIC PROCEDURE TODAY AT THE [facility]:   Refer to the procedure report that was given to you for any specific questions about what was found during the examination.  If the procedure report does not answer your questions, please call your gastroenterologist to clarify.  If you requested that your care partner not be given the details of your procedure findings, then the procedure report has been included in a sealed envelope for you to review at your convenience later.  YOU SHOULD EXPECT: Some feelings of bloating in the abdomen. Passage of more gas than usual.  Walking can help get rid of the air that was put into your GI tract during the procedure and reduce the bloating. If you had a lower endoscopy (such as a colonoscopy or flexible sigmoidoscopy) you may notice spotting of blood in your stool or on the toilet paper. If you underwent a bowel prep for your procedure, you may not have a normal bowel movement for a few days.  Please Note:  You might notice some irritation and congestion in your nose or some drainage.  This is from the oxygen used during your procedure.  There is no need for concern and it should clear up in a day or so.  SYMPTOMS TO REPORT IMMEDIATELY:  Following lower endoscopy (colonoscopy or flexible sigmoidoscopy):  Excessive amounts of blood in the stool  Significant tenderness or worsening of abdominal pains  Swelling of the abdomen that is new, acute  Fever of 100F or higher  Following upper endoscopy (EGD)  Vomiting of blood or coffee ground material  New chest pain or pain under the shoulder blades  Painful or persistently difficult swallowing  New shortness of breath  Fever of 100F or higher  Black,  tarry-looking stools  For urgent or emergent issues, a gastroenterologist can be reached at any hour by calling [phone]. Do not use MyChart messaging for urgent concerns.    DIET:  We do recommend a small meal at first, but then you may proceed to your regular diet.  Drink plenty of fluids but you should avoid alcoholic beverages for 24 hours.  ACTIVITY:  You should plan to take it easy for the rest of today and you should NOT DRIVE or use heavy machinery until tomorrow (because of the sedation medicines used during the test).    FOLLOW UP: Our staff will call the number listed on your records the next business day following your procedure.  We will call around 7:15- 8:00 am to check on you and address any questions or concerns that you may have regarding the information given to you following your procedure. If we do not reach you, we will leave a message.     If any biopsies were taken you will be contacted by phone or by letter within the next 1-3 weeks.  Please call us at [phone] if you have not heard about the biopsies in 3 weeks.    SIGNATURES/CONFIDENTIALITY: You and/or your care partner have signed paperwork which will be entered into your electronic medical record.  These signatures attest to the fact that that the information above on your After Visit Summary has been reviewed and is understood.  Full responsibility of the confidentiality of this discharge information  lies with you and/or your care-partner.

## 2023-10-18 NOTE — Progress Notes (Signed)
Called to room to assist during endoscopic procedure.  Patient ID and intended procedure confirmed with present staff. Received instructions for my participation in the procedure from the performing physician.

## 2023-10-18 NOTE — Op Note (Signed)
[facility] Patient Name: Katherine Foley Procedure Date: 10/18/2023 2:32 PM MRN: [id number] Endoscopist: Sherilyn Cooter L. Myrtie Neither , MD, [id number] Age: 73 Referring MD:  Date of Birth: 08-May-1951 Gender: Female Account #: [id number] Procedure:                Colonoscopy Indications:              Heme positive stool, Unexplained normocytic anemia                           clinical details in recent office consult note                           chronic altered bowel habits with constipation (no                            BM 3-4 days), then periods of diarrhea                           last colonoscopy 2015 Medicines:                Monitored Anesthesia Care Procedure:                Pre-Anesthesia Assessment:                           - Prior to the procedure, a History and Physical                            was performed, and patient medications and                            allergies were reviewed. The patient's tolerance of                            previous anesthesia was also reviewed. The risks                            and benefits of the procedure and the sedation                            options and risks were discussed with the patient.                            All questions were answered, and informed consent                            was obtained. Prior Anticoagulants: The patient has                            taken no anticoagulant or antiplatelet agents                            except for aspirin. ASA Grade Assessment: III - A  patient with severe systemic disease. After                            reviewing the risks and benefits, the patient was                            deemed in satisfactory condition to undergo the                            procedure.                           After obtaining informed consent, the colonoscope                            was passed under direct vision. Throughout the                             procedure, the patient's blood pressure, pulse, and                            oxygen saturations were monitored continuously. The                            Olympus Scope SN: [id number] was introduced through                            the anus and advanced to the the cecum, identified                            by appendiceal orifice and ileocecal valve. The                            colonoscopy was performed with difficulty due to                            multiple diverticula in the colon, poor bowel prep,                            significant looping and a tortuous colon.                            Successful completion of the procedure was aided by                            using manual pressure, straightening and shortening                            the scope to obtain bowel loop reduction and                            lavage. The patient tolerated the procedure well.  The quality of the bowel preparation was poor. The                            ileocecal valve, appendiceal orifice, and rectum                            were photographed. The bowel preparation used was                            SUPREP via split dose instruction. Scope In: 2:44:12 PM Scope Out: 3:08:21 PM Scope Withdrawal Time: 0 hours 14 minutes 16 seconds  Total Procedure Duration: 0 hours 24 minutes 9 seconds  Findings:                 The perianal and digital rectal examinations were                            normal.                           Repeat examination of right colon under NBI                            performed.                           Four sessile polyps were found in the sigmoid                            colon, transverse colon and ascending colon. The                            polyps were 4 to 10 mm in size. These polyps were                            removed with a cold snare. Resection and retrieval                            were complete.                            Copious quantities of semi-liquid stool was found                            in the entire colon, interfering with                            visualization. Lavage of the area was performed                            using a large amount, resulting in incomplete                            clearance with fair visualization at best in most  areas.                           Multiple diverticula were found in the left colon.                           The exam was otherwise without abnormality on                            direct and retroflexion views. Complications:            No immediate complications. Estimated Blood Loss:     Estimated blood loss was minimal. Impression:               - Preparation of the colon was poor.                           - Four 4 to 10 mm polyps in the sigmoid colon, in                            the transverse colon and in the ascending colon,                            removed with a cold snare. Resected and retrieved.                           - Stool in the entire examined colon.                           - Diverticulosis in the left colon.                           - The examination was otherwise normal on direct                            and retroflexion views.                           Notwithstanding poor bowel prep and limited                            visualization, it is not clear to what extent                            chronic GI blood loss is contributing to this                            patient's normocytic anemia. Anemia has occurred                            intermittently since at least 2023.                           Patient clearly has chronic constipation                            (  diverticulosis, tortuous left colon anatomy)                            explaining the reported bowel pattern. Recommendation:           - Patient has a contact number available for                            emergencies. The  signs and symptoms of potential                            delayed complications were discussed with the                            patient. Return to normal activities tomorrow.                            Written discharge instructions were provided to the                            patient.                           - Resume previous diet.                           - Continue present medications.                           - Await pathology results.                           - Miralax one capful in a glass of water daily,                            increasing if needed to twice daily.                           - See PCP re: anemia workup                           - Repeat colonoscopy at next available appointment                            (within 3 months). Ducolax and Nulytely prep for                            next exam (see above)                           - See the other procedure note for documentation of                            additional recommendations. Chantell Kunkler L. Myrtie Neither, MD 10/18/2023 3:31:10 PM This report has been signed electronically.

## 2023-10-18 NOTE — Progress Notes (Signed)
No significant changes to clinical history since GI office visit on 09/19/23.  The patient is appropriate for an endoscopic procedure in the ambulatory setting.  - Amada Jupiter, MD

## 2023-10-18 NOTE — Op Note (Signed)
[facility] Patient Name: Katherine Foley Procedure Date: 10/18/2023 2:31 PM MRN: [id number] Endoscopist: Sherilyn Cooter L. Myrtie Neither , MD, [id number] Age: 73 Referring MD:  Date of Birth: 04/03/1951 Gender: Female Account #: [id number] Procedure:                Upper GI endoscopy Indications:              Unexplained normocytic anemia, Heme positive stool Medicines:                Monitored Anesthesia Care Procedure:                Pre-Anesthesia Assessment:                           - Prior to the procedure, a History and Physical                            was performed, and patient medications and                            allergies were reviewed. The patient's tolerance of                            previous anesthesia was also reviewed. The risks                            and benefits of the procedure and the sedation                            options and risks were discussed with the patient.                            All questions were answered, and informed consent                            was obtained. Prior Anticoagulants: The patient has                            taken no anticoagulant or antiplatelet agents                            except for aspirin. ASA Grade Assessment: III - A                            patient with severe systemic disease. After                            reviewing the risks and benefits, the patient was                            deemed in satisfactory condition to undergo the                            procedure.  After obtaining informed consent, the endoscope was                            passed under direct vision. Throughout the                            procedure, the patient's blood pressure, pulse, and                            oxygen saturations were monitored continuously. The                            GIF [id number] #[id number] was introduced through the                            mouth, and advanced to the second  part of duodenum.                            The upper GI endoscopy was accomplished without                            difficulty. The patient tolerated the procedure                            well. Scope In: Scope Out: Findings:                 The larynx was normal.                           The esophagus was normal.                           The stomach was normal.                           The cardia and gastric fundus were normal on                            retroflexion.                           The examined duodenum was normal. Complications:            No immediate complications. Estimated Blood Loss:     Estimated blood loss: none. Impression:               - Normal larynx.                           - Normal esophagus.                           - Normal stomach.                           - Normal examined duodenum.                           -  No specimens collected. Recommendation:           - Patient has a contact number available for                            emergencies. The signs and symptoms of potential                            delayed complications were discussed with the                            patient. Return to normal activities tomorrow.                            Written discharge instructions were provided to the                            patient.                           - Resume previous diet.                           - Continue present medications.                           - See the other procedure note for documentation of                            additional recommendations.                           - See PCP for further w/u of anemia (Iron studies,                            B12, folate,etc, which have not been checked for                            years) Sherilyn Cooter L. Myrtie Neither, MD 10/18/2023 3:21:50 PM This report has been signed electronically.

## 2023-10-21 ENCOUNTER — Telehealth: Payer: Self-pay

## 2023-10-21 NOTE — Telephone Encounter (Signed)
 Follow up call to pt, lm for pt to call if having any difficulty with normal activities or eating and drinking.  Also to call if any other questions or concerns. And let pt know to call if any questions or concerns about her IV site.

## 2023-10-23 LAB — SURGICAL PATHOLOGY

## 2023-10-24 ENCOUNTER — Encounter: Payer: Self-pay | Admitting: Gastroenterology

## 2023-10-29 DIAGNOSIS — F4381 Prolonged grief disorder: Secondary | ICD-10-CM | POA: Diagnosis not present

## 2023-11-18 ENCOUNTER — Encounter: Payer: Self-pay | Admitting: Primary Care

## 2023-12-24 ENCOUNTER — Encounter: Payer: Self-pay | Admitting: Certified Registered Nurse Anesthetist

## 2023-12-24 ENCOUNTER — Encounter: Payer: Self-pay | Admitting: Gastroenterology

## 2023-12-24 DIAGNOSIS — F4381 Prolonged grief disorder: Secondary | ICD-10-CM | POA: Diagnosis not present

## 2023-12-25 ENCOUNTER — Other Ambulatory Visit: Payer: Self-pay | Admitting: Cardiovascular Disease

## 2023-12-27 ENCOUNTER — Ambulatory Visit: Payer: Medicare HMO | Admitting: Gastroenterology

## 2023-12-27 ENCOUNTER — Encounter: Payer: Self-pay | Admitting: Gastroenterology

## 2023-12-27 VITALS — BP 116/62 | HR 55 | Temp 97.9°F | Resp 16 | Ht 62.5 in | Wt 124.4 lb

## 2023-12-27 DIAGNOSIS — D649 Anemia, unspecified: Secondary | ICD-10-CM | POA: Diagnosis not present

## 2023-12-27 DIAGNOSIS — Z860101 Personal history of adenomatous and serrated colon polyps: Secondary | ICD-10-CM | POA: Diagnosis not present

## 2023-12-27 DIAGNOSIS — Q438 Other specified congenital malformations of intestine: Secondary | ICD-10-CM

## 2023-12-27 DIAGNOSIS — E785 Hyperlipidemia, unspecified: Secondary | ICD-10-CM | POA: Diagnosis not present

## 2023-12-27 DIAGNOSIS — I10 Essential (primary) hypertension: Secondary | ICD-10-CM | POA: Diagnosis not present

## 2023-12-27 DIAGNOSIS — F32A Depression, unspecified: Secondary | ICD-10-CM | POA: Diagnosis not present

## 2023-12-27 DIAGNOSIS — Q439 Congenital malformation of intestine, unspecified: Secondary | ICD-10-CM

## 2023-12-27 DIAGNOSIS — K5904 Chronic idiopathic constipation: Secondary | ICD-10-CM | POA: Diagnosis not present

## 2023-12-27 DIAGNOSIS — J449 Chronic obstructive pulmonary disease, unspecified: Secondary | ICD-10-CM | POA: Diagnosis not present

## 2023-12-27 DIAGNOSIS — R195 Other fecal abnormalities: Secondary | ICD-10-CM | POA: Diagnosis not present

## 2023-12-27 DIAGNOSIS — F418 Other specified anxiety disorders: Secondary | ICD-10-CM | POA: Diagnosis not present

## 2023-12-27 MED ORDER — SODIUM CHLORIDE 0.9 % IV SOLN: 500.0000 mL | INTRAVENOUS | Status: AC

## 2023-12-27 NOTE — Telephone Encounter (Signed)
 Pt of Dr. Arlester Ladd. Her Amlodipine  is not on her med list. Does Dr. Arlester Ladd want to refill? Please advise

## 2023-12-27 NOTE — Progress Notes (Signed)
 Pt's states no medical or surgical changes since previsit or office visit.

## 2023-12-27 NOTE — Progress Notes (Signed)
 History and Physical:  This patient presents for endoscopic testing for: Encounter Diagnoses  Name Primary?   Positive fecal occult blood test Yes   Chronic idiopathic constipation     73 year old woman here today for colonoscopy for evaluation of anemia and heme positive stool.  Further clinical details in H&P and colonoscopy report February 2025.  Incomplete bowel preparation on that exam, some adenomatous polyps removed but limited visualization.  She is here today for repeat colonoscopy after more extensive bowel preparation.  Patient is otherwise without complaints or active issues today.   Past Medical History: Past Medical History:  Diagnosis Date   Adenomatous colon polyp    Angio-edema    Anxiety    Anxiety/Depression 02/15/2009   Aortic stenosis s/p bioprosthetic AVR 07/07/2010   Echo 9/19:  Mild conc LVH, EF 60-65, no RWMA, Gr 1 DD, AVR ok (mean 10, peak 21), trivial MR, normal RVSF, mild TR // Echo 10/22: EF 60-65, no RWMA, normal RVSF, AVR with increased gradients (mean 24 increased from 10 in 2019) // Echocardiogram 10/23: EF 60-65, no RWMA, mildly reduced RVSF, AVR w mean gradient 17.3 mmHg   Asthma    Barrett's esophagus    04/2007; 2000   Carotid stenosis 05/17/2010   a. dopplers 11/11: RICA 40-50%; LICA 0-39% (follow up due 11/12)   COPD 07/23/2007   Coronary Artery Disease 05/17/2010   NSTEMI 8/11 - BMS to RCA;  cath 4/12: dLAD 50%, RI 80% (PCI), AVCFX 30%, pRCA 30%, stent ok, 40-50, then 50% dist RCA;  c. s/p Promus DES to RI 12/25/10;  d. 04/2011 CABG x 1 (LIMA->LAD) @ time of AVR // Cath in 9/19: RI and RCA stents ok; L-LAD atretic, mild disease in mLAD >> med Rx   Eczema    Family history of breast cancer    2 daughters; PALB2 mutation in family   Genetic testing 02/10/2018   PALB2 analysis at [facility] - Negative for familial mutation   GERD    H/O hiatal hernia    Hemorrhoid thrombosis    "had it lanced" (03/25/2013)   Herpes simplex without mention of  complication 01/16/2010   History of blood transfusion    'w/OHS" (03/25/2013)   Hyperlipidemia 08/29/2009   Hypertension 04/14/2007   Migraine HAs 04/20/2008   Myocardial infarction [facility]) 2011   "during catherization" (03/25/2013)   OSTEOPENIA 04/14/2007   Ovarian cyst    Parotid mass    left   Pneumonia    "multiple times" (03/25/2013)   Recurrent upper respiratory infection (URI)    Urine, incontinence, stress female      Past Surgical History: Past Surgical History:  Procedure Laterality Date   ADENOIDECTOMY     AORTIC VALVE REPLACEMENT  09/03/2010   APPENDECTOMY     BLEPHAROPLASTY Bilateral ~ 1998   CARDIAC CATHETERIZATION N/A 07/06/2016   Procedure: Left Heart Cath and Cors/Grafts Angiography;  Surgeon: Arnoldo Lapping, MD;  Location: [facility] INVASIVE CV LAB;  Service: Cardiovascular;  Laterality: N/A;   CHOLECYSTECTOMY N/A 03/25/2013   Procedure: LAPAROSCOPIC CHOLECYSTECTOMY WITH INTRAOPERATIVE CHOLANGIOGRAM;  Surgeon: Kari Otto. Eli Grizzle, MD;  Location: [facility] OR;  Service: General;  Laterality: N/A;   CORONARY ANGIOGRAPHY N/A 05/26/2018   Procedure: CORONARY ANGIOGRAPHY (CATH LAB);  Surgeon: Lucendia Rusk, MD;  Location: [facility] INVASIVE CV LAB;  Service: Cardiovascular;  Laterality: N/A;   CORONARY ANGIOPLASTY WITH STENT PLACEMENT  09/03/2008   "I have 2 stents" (03/25/2013)   NASAL SINUS SURGERY  1997; 1998   "cust in  maxillary sinus; put a stent in then removed it" (03/25/2013)   ORIF ANKLE FRACTURE Right 02/14/2021   Procedure: OPEN TREATMENT OF RIGHT TRIMALLEOLAR ANKLE FRACTURE WITHOUT POSTERIOR FIXATION, SYNDESMOSIS;  Surgeon: Donnamarie Gables, MD;  Location: [facility];  Service: Orthopedics;  Laterality: Right;   OVARIAN CYST REMOVAL Right 09/03/2009   "size of a soccer ball" (03/25/2013)   PAROTIDECTOMY Left 04/28/2019   Procedure: LEFT TOTAL PAROTIDECTOMY;  Surgeon: Reynold Caves, MD;  Location: [facility];  Service: ENT;  Laterality: Left;    RIGHT HEART CATH AND CORONARY ANGIOGRAPHY N/A 07/23/2022   Procedure: RIGHT HEART CATH AND CORONARY ANGIOGRAPHY;  Surgeon: Odie Benne, MD;  Location: [facility] INVASIVE CV LAB;  Service: Cardiovascular;  Laterality: N/A;   thrombosed hemorrohid lanced     TONSILLECTOMY AND ADENOIDECTOMY     TOTAL ABDOMINAL HYSTERECTOMY      Allergies: Allergies  Allergen Reactions   Codeine Sulfate Itching and Rash   Hydrocodone-Acetaminophen  Itching and Rash    Outpatient Meds: Current Outpatient Medications  Medication Sig Dispense Refill   acetaminophen  (TYLENOL ) 650 MG CR tablet Take 1,300 mg by mouth every 8 (eight) hours as needed for pain.     aspirin  EC 81 MG tablet Take 1 tablet (81 mg total) by mouth daily.     buPROPion  (WELLBUTRIN  XL) 300 MG 24 hr tablet TAKE 1 TABLET BY MOUTH ONCE DAILY 30 tablet 10   Calcium  Carb-Cholecalciferol (CALCIUM  500/D PO) Take 2 tablets by mouth daily. Mag and zinc added     desvenlafaxine  (PRISTIQ ) 100 MG 24 hr tablet TAKE 1 TABLET BY MOUTH EVERY MORNING 30 tablet 10   dexlansoprazole  (DEXILANT ) 60 MG capsule Take 1 capsule (60 mg total) by mouth daily. 90 capsule 3   ezetimibe  (ZETIA ) 10 MG tablet TAKE 1 TABLET BY MOUTH ONCE DAILY 90 tablet 1   gabapentin  (NEURONTIN ) 300 MG capsule TAKE 1 CAPSULE BY MOUTH EVERY DAY AT BEDTIME 30 capsule 10   isosorbide  mononitrate (IMDUR ) 30 MG 24 hr tablet TAKE 1/2 TABLET BY MOUTH ONCE DAILY 45 tablet 3   LORazepam  (ATIVAN ) 0.5 MG tablet TAKE ONE (1) TABLET BY MOUTH TWICE DAILY AS NEEDED FOR ANXIETY *DO NOT DRIVE FOR 8 HOURS AFTER USING 60 tablet 5   Multiple Vitamins-Minerals (MULTIVITAMIN ADULT PO) Take 1 tablet by mouth daily.     rosuvastatin  (CRESTOR ) 40 MG tablet TAKE 1 TABLET BY MOUTH ONCE DAILY 30 tablet 10   albuterol  (VENTOLIN  HFA) 108 (90 Base) MCG/ACT inhaler Inhale 2 puffs into the lungs every 6 (six) hours as needed for wheezing or shortness of breath. 8 g 3   azelastine  (ASTELIN ) 0.1 % nasal spray Place 2  sprays into both nostrils 2 (two) times daily. Use in each nostril as directed 30 mL 12   Budeson-Glycopyrrol-Formoterol  (BREZTRI  AEROSPHERE) 160-9-4.8 MCG/ACT AERO Inhale 2 puffs into the lungs in the morning and at bedtime. 1 each 11   carboxymethylcellulose (REFRESH PLUS) 0.5 % SOLN Place 1 drop into both eyes 3 (three) times daily as needed.     fluticasone  (CUTIVATE ) 0.05 % cream Apply 1 Application topically 2 (two) times daily as needed (eczema).     fluticasone  (FLONASE ) 50 MCG/ACT nasal spray Place 2 sprays into both nostrils as needed for allergies or rhinitis. (Patient not taking: Reported on 10/18/2023) 16 g 3   ipratropium (ATROVENT ) 0.06 % nasal spray Place 2 sprays into both nostrils 2 (two) times daily as needed for rhinitis (drainage). 15 mL 10  ipratropium-albuterol  (DUONEB) 0.5-2.5 (3) MG/3ML SOLN Take 3 mLs by nebulization every 6 (six) hours as needed. 360 mL 4   metoCLOPramide  (REGLAN ) 5 MG tablet Take 1 tablet (5 mg total) by mouth once for 1 dose. 2 tablet 0   nitroGLYCERIN  (NITROSTAT ) 0.4 MG SL tablet Place 1 tablet (0.4 mg total) under the tongue every 5 (five) minutes as needed for chest pain (if pain does not resolve with 2 doses, call 911). 30 tablet 5   triamcinolone  (NASACORT ) 55 MCG/ACT AERO nasal inhaler Place 1 spray into the nose daily. Start with 1 spray each side twice a day for 3 days, then reduce to daily. (Patient not taking: Reported on 10/18/2023) 1 each 2   valACYclovir  (VALTREX ) 1000 MG tablet TAKE 2 TABLETS BY MOUTH TWICE DAILY FOR 1 DAY AT FIRST SIGN OF COLD SORE *REFILL REQUEST* 30 tablet 10   Current Facility-Administered Medications  Medication Dose Route Frequency Provider Last Rate Last Admin   0.9 %  sodium chloride  infusion  500 mL Intravenous Continuous Danis, Roel Clarity III, MD          ___________________________________________________________________ Objective   Exam:  BP 130/77   Pulse 68   Temp 97.9 F (36.6 C)   Ht 5' 2.5" (1.588 m)    Wt 124 lb 6.4 oz (56.4 kg)   SpO2 95%   BMI 22.39 kg/m   CV: regular , S1/S2 Resp: clear to auscultation bilaterally, normal RR and effort noted GI: soft, no tenderness, with active bowel sounds.   Assessment: Encounter Diagnoses  Name Primary?   Positive fecal occult blood test Yes   Chronic idiopathic constipation      Plan: Colonoscopy  The benefits and risks of the planned procedure(s) were described in detail with the patient or (when appropriate) their health care proxy.  Risks were outlined as including, but not limited to, bleeding, infection, perforation, adverse medication reaction leading to cardiac or pulmonary decompensation, pancreatitis (if ERCP).  The limitation of incomplete mucosal visualization was also discussed.  No guarantees or warranties were given.  The patient is appropriate for an endoscopic procedure in the ambulatory setting.   - Lorella Roles, MD

## 2023-12-27 NOTE — Patient Instructions (Signed)
 YOU HAD AN ENDOSCOPIC PROCEDURE TODAY AT THE [facility]:   Refer to the procedure report that was given to you for any specific questions about what was found during the examination.  If the procedure report does not answer your questions, please call your gastroenterologist to clarify.  If you requested that your care partner not be given the details of your procedure findings, then the procedure report has been included in a sealed envelope for you to review at your convenience later.  YOU SHOULD EXPECT: Some feelings of bloating in the abdomen. Passage of more gas than usual.  Walking can help get rid of the air that was put into your GI tract during the procedure and reduce the bloating. If you had a lower endoscopy (such as a colonoscopy or flexible sigmoidoscopy) you may notice spotting of blood in your stool or on the toilet paper. If you underwent a bowel prep for your procedure, you may not have a normal bowel movement for a few days.  Please Note:  You might notice some irritation and congestion in your nose or some drainage.  This is from the oxygen used during your procedure.  There is no need for concern and it should clear up in a day or so.  SYMPTOMS TO REPORT IMMEDIATELY:  Following lower endoscopy (colonoscopy or flexible sigmoidoscopy):  Excessive amounts of blood in the stool  Significant tenderness or worsening of abdominal pains  Swelling of the abdomen that is new, acute  Fever of 100F or higher  For urgent or emergent issues, a gastroenterologist can be reached at any hour by calling [phone]. Do not use MyChart messaging for urgent concerns.    DIET:  We do recommend a small meal at first, but then you may proceed to your regular diet.  Drink plenty of fluids but you should avoid alcoholic beverages for 24 hours.  ACTIVITY:  You should plan to take it easy for the rest of today and you should NOT DRIVE or use heavy machinery until tomorrow (because of  the sedation medicines used during the test).    FOLLOW UP: Our staff will call the number listed on your records the next business day following your procedure.  We will call around 7:15- 8:00 am to check on you and address any questions or concerns that you may have regarding the information given to you following your procedure. If we do not reach you, we will leave a message.      SIGNATURES/CONFIDENTIALITY: You and/or your care partner have signed paperwork which will be entered into your electronic medical record.  These signatures attest to the fact that that the information above on your After Visit Summary has been reviewed and is understood.  Full responsibility of the confidentiality of this discharge information lies with you and/or your care-partner.

## 2023-12-27 NOTE — Progress Notes (Signed)
 Report given to PACU, vss

## 2023-12-27 NOTE — Op Note (Signed)
 [facility] Patient Name: Katherine Foley Procedure Date: 12/27/2023 10:49 AM MRN: [id number] Endoscopist: Ace Abu L. Dominic Friendly , MD, [id number] Age: 73 Referring MD:  Date of Birth: 28-Jun-1951 Gender: Female Account #: [id number] Procedure:                Colonoscopy Indications:              Heme positive stool, Unexplained normocytic anemia                           see details in last office note and Feb 2025                            EGD/colonoscopy reports                           poor prep and 4 TA polyps (4-74mm) removed Medicines:                Monitored Anesthesia Care Procedure:                Pre-Anesthesia Assessment:                           - Prior to the procedure, a History and Physical                            was performed, and patient medications and                            allergies were reviewed. The patient's tolerance of                            previous anesthesia was also reviewed. The risks                            and benefits of the procedure and the sedation                            options and risks were discussed with the patient.                            All questions were answered, and informed consent                            was obtained. Prior Anticoagulants: The patient has                            taken no anticoagulant or antiplatelet agents. ASA                            Grade Assessment: III - A patient with severe                            systemic disease. After reviewing the risks and  benefits, the patient was deemed in satisfactory                            condition to undergo the procedure.                           After obtaining informed consent, the colonoscope                            was passed under direct vision. Throughout the                            procedure, the patient's blood pressure, pulse, and                            oxygen saturations were monitored continuously.  The                            CF [id number] #[id number] was introduced through the anus                            and advanced to the the cecum, identified by                            appendiceal orifice and ileocecal valve. The                            colonoscopy was performed with difficulty due to a                            redundant colon, significant looping and a tortuous                            colon. Successful completion of the procedure was                            aided by using manual pressure, straightening and                            shortening the scope to obtain bowel loop reduction                            and lavage. The patient tolerated the procedure                            well. The quality of the bowel preparation was good                            with additional lavage. The ileocecal valve,                            appendiceal orifice, and rectum were photographed.  The bowel preparation used was GoLYTELY. Scope In: 11:12:04 AM Scope Out: 11:32:08 AM Scope Withdrawal Time: 0 hours 12 minutes 2 seconds  Total Procedure Duration: 0 hours 20 minutes 4 seconds  Findings:                 The perianal and digital rectal examinations were                            normal.                           The colon (entire examined portion) was                            significantly redundant and tortuous.                           The exam was otherwise without abnormality on                            direct and retroflexion views. Complications:            No immediate complications. Estimated Blood Loss:     Estimated blood loss: none. Impression:               - Redundant colon.                           - The examination was otherwise normal on direct                            and retroflexion views.                           - No specimens collected.                           As noted in Feb 2025 colonoscopy report, there  is                            no source for heme positive stool seen. It is not                            clear to what extent GI blood loss is contributing                            to the anemia this patient has had intermittently                            since at least 2023.                           Further workup (Iron studies, B12 , folate, etc) is                            recommended. Recommendation:           -  Patient has a contact number available for                            emergencies. The signs and symptoms of potential                            delayed complications were discussed with the                            patient. Return to normal activities tomorrow.                            Written discharge instructions were provided to the                            patient.                           - Resume previous diet.                           - Continue present medications.                           - Repeat colonoscopy in 3 years for polyp                            surveillance.                           - Return to primary care provider for anemia. Jordanna Hendrie L. Dominic Friendly, MD 12/27/2023 11:42:29 AM This report has been signed electronically.

## 2023-12-30 ENCOUNTER — Telehealth: Payer: Self-pay

## 2023-12-30 NOTE — Telephone Encounter (Signed)
 No answer, left message to call if having any issues or concerns, B.Vale Mousseau RN

## 2023-12-31 ENCOUNTER — Other Ambulatory Visit: Payer: Self-pay | Admitting: Cardiovascular Disease

## 2024-01-06 ENCOUNTER — Encounter: Payer: Self-pay | Admitting: Family Medicine

## 2024-01-06 ENCOUNTER — Ambulatory Visit (INDEPENDENT_AMBULATORY_CARE_PROVIDER_SITE_OTHER)

## 2024-01-06 ENCOUNTER — Ambulatory Visit: Payer: Self-pay

## 2024-01-06 ENCOUNTER — Ambulatory Visit (INDEPENDENT_AMBULATORY_CARE_PROVIDER_SITE_OTHER): Admitting: Family Medicine

## 2024-01-06 VITALS — BP 100/70 | HR 73 | Temp 97.4°F | Ht 62.5 in | Wt 126.8 lb

## 2024-01-06 DIAGNOSIS — R5383 Other fatigue: Secondary | ICD-10-CM | POA: Diagnosis not present

## 2024-01-06 DIAGNOSIS — R051 Acute cough: Secondary | ICD-10-CM

## 2024-01-06 DIAGNOSIS — J449 Chronic obstructive pulmonary disease, unspecified: Secondary | ICD-10-CM | POA: Diagnosis not present

## 2024-01-06 DIAGNOSIS — R0602 Shortness of breath: Secondary | ICD-10-CM

## 2024-01-06 DIAGNOSIS — R059 Cough, unspecified: Secondary | ICD-10-CM | POA: Diagnosis not present

## 2024-01-06 DIAGNOSIS — I25119 Atherosclerotic heart disease of native coronary artery with unspecified angina pectoris: Secondary | ICD-10-CM

## 2024-01-06 DIAGNOSIS — J029 Acute pharyngitis, unspecified: Secondary | ICD-10-CM | POA: Diagnosis not present

## 2024-01-06 DIAGNOSIS — I10 Essential (primary) hypertension: Secondary | ICD-10-CM

## 2024-01-06 DIAGNOSIS — J441 Chronic obstructive pulmonary disease with (acute) exacerbation: Secondary | ICD-10-CM | POA: Diagnosis not present

## 2024-01-06 DIAGNOSIS — R918 Other nonspecific abnormal finding of lung field: Secondary | ICD-10-CM | POA: Diagnosis not present

## 2024-01-06 LAB — POC COVID19 BINAXNOW: SARS Coronavirus 2 Ag: NEGATIVE

## 2024-01-06 LAB — POCT INFLUENZA A/B
Influenza A, POC: NEGATIVE
Influenza B, POC: NEGATIVE

## 2024-01-06 LAB — POCT RAPID STREP A (OFFICE): Rapid Strep A Screen: NEGATIVE

## 2024-01-06 MED ORDER — AMOXICILLIN-POT CLAVULANATE 875-125 MG PO TABS: 1.0000 | ORAL_TABLET | Freq: Two times a day (BID) | ORAL | 0 refills | Status: AC

## 2024-01-06 MED ORDER — METHYLPREDNISOLONE ACETATE 80 MG/ML IJ SUSP
80.0000 mg | Freq: Once | INTRAMUSCULAR | Status: AC
Start: 2024-01-06 — End: 2024-01-06
  Administered 2024-01-06: 80 mg via INTRAMUSCULAR

## 2024-01-06 MED ORDER — PREDNISONE 50 MG PO TABS: 50.0000 mg | ORAL_TABLET | Freq: Every day | ORAL | 0 refills | Status: DC

## 2024-01-06 NOTE — Telephone Encounter (Signed)
 Also asked for patient to be informed we may still have to refer to Emergency Department- with breathing issues would be ideal if someone else could bring her to the visit

## 2024-01-06 NOTE — Patient Instructions (Addendum)
 Depo medrol  80 mg injection today before you leave  Start prednisone  tomorrow morning  Start Augmentin  tonight  Please stop by lab before you go If you have mychart- we will send your results within 3 business days of us  receiving them.  If you do not have mychart- we will call you about results within 5 business days of us  receiving them.  *please also note that you will see labs on mychart as soon as they post. I will later go in and write notes on them- will say "notes from Dr. Arlene Ben"   X-ray as well  Verbally discussed holding amlodipine  until blood pressure improved (may be higher on prednisone )  Recommended follow up: Return for thursday 4 pm- schedule on way out. New or worsening symptoms or oxygen goes back below 88 at home- call 911 immediately or if worsening chest pain seek care as well- you declined significant workup today and Emergency Department visit for now

## 2024-01-06 NOTE — Telephone Encounter (Signed)
 Chief Complaint: Shortness of breath Symptoms: pulse oximeter readings of 85% and lower, shortness of breath Frequency: last couple of days Pertinent Negatives: Patient denies fever Disposition: [x] ED /[] Urgent Care (no appt availability in office) / [] Appointment(In office/virtual)/ []  [facility]/ [] Home Care/ [] Refused Recommended Disposition /[] [facility]/ []  Follow-up with PCP Additional Notes: patient called very short of breath with concerns for pulse oximeter readings of 85%. Patient reports readings are unable to get above 85%. Current pulse oximeter reading was 82%. Patient was audibly short of breath. Patient endorses cough along with nebulizer treatments not working at all. Per protocol, patient is recommended to the ED. Patient asked why she couldn't wait to see provider in office today. Patient was instructed that it was very important for her to see evaluated emergently in the ED. Patient does have a friend with her. Patient is recommended to call 911. Patient asked her if friend could take her. Patient was encouraged to call 911 but she stated she would rather her friend take her. Patient verbalized understanding of plan and all questions answered.    Copied from CRM [id number]. Topic: Clinical - Red Word Triage >> Jan 06, 2024 11:08 AM Jayson Michael wrote: [facility] that prompted transfer to Nurse Triage: low oxygen level can't get over 85%, persistent for a few days nebulizer is not helping. Patient is having difficulty breathing they are scheduling for an office visit today at 3:00 pm Reason for Disposition  [1] MODERATE difficulty breathing (e.g., speaks in phrases, SOB even at rest, pulse 100-120) AND [2] NEW-onset or WORSE than normal  Answer Assessment - Initial Assessment Questions 1. RESPIRATORY STATUS: "Describe your breathing?" (e.g., wheezing, shortness of breath, unable to speak, severe coughing)      Shortness of breath 2. ONSET: "When did this  breathing problem begin?"      Started a couple of days ago 3. PATTERN "Does the difficult breathing come and go, or has it been constant since it started?"      constant 4. SEVERITY: "How bad is your breathing?" (e.g., mild, moderate, severe)    - MILD: No SOB at rest, mild SOB with walking, speaks normally in sentences, can lie down, no retractions, pulse < 100.    - MODERATE: SOB at rest, SOB with minimal exertion and prefers to sit, cannot lie down flat, speaks in phrases, mild retractions, audible wheezing, pulse 100-120.    - SEVERE: Very SOB at rest, speaks in single words, struggling to breathe, sitting hunched forward, retractions, pulse > 120      Severe 5. RECURRENT SYMPTOM: "Have you had difficulty breathing before?" If Yes, ask: "When was the last time?" and "What happened that time?"      yes 6. CARDIAC HISTORY: "Do you have any history of heart disease?" (e.g., heart attack, angina, bypass surgery, angioplasty)      Heart problems 7. LUNG HISTORY: "Do you have any history of lung disease?"  (e.g., pulmonary embolus, asthma, emphysema)     COPD 8. CAUSE: "What do you think is causing the breathing problem?"      unsure 9. OTHER SYMPTOMS: "Do you have any other symptoms? (e.g., dizziness, runny nose, cough, chest pain, fever)     Chest pain 10. O2 SATURATION MONITOR:  "Do you use an oxygen saturation monitor (pulse oximeter) at home?" If Yes, ask: "What is your reading (oxygen level) today?" "What is your usual oxygen saturation reading?" (e.g., 95%)       82% 12.  TRAVEL: "Have you traveled out of the country in the last month?" (e.g., travel history, exposures)       no  Protocols used: Breathing Difficulty-A-AH

## 2024-01-06 NOTE — Progress Notes (Addendum)
 Phone [phone] In person visit   Subjective:   Katherine Foley is a 73 y.o. year old very pleasant female patient who presents for/with See problem oriented charting Chief Complaint  Patient presents with   Sore Throat    Pt c/o sore throat and body aches that started 4 days ago.   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    Priority: High   Depression 09/22/2015    Priority: High   Former smoker 09/22/2015    Priority: High   S/P AVR (aortic valve replacement) 11/06/2013    Priority: High   Aortic stenosis 07/07/2010    Priority: High   Coronary artery disease involving native coronary artery with angina pectoris (HCC) 05/17/2010    Priority: High   COPD (chronic obstructive pulmonary disease) (HCC) 07/23/2007    Priority: High   Aortic atherosclerosis (HCC) 03/10/2020    Priority: Medium    Restless legs 08/25/2019    Priority: Medium    Hot flashes 03/22/2016    Priority: Medium    Diastolic dysfunction 05/31/2015    Priority: Medium    GERD (gastroesophageal reflux disease) 06/04/2014    Priority: Medium    Carotid stenosis 05/17/2010    Priority: Medium    HLD (hyperlipidemia) 08/29/2009    Priority: Medium    Essential hypertension 04/14/2007    Priority: Medium    Osteopenia 04/14/2007    Priority: Medium    Genetic testing 02/10/2018    Priority: Low   Family history of breast cancer     Priority: Low   Allergic rhinitis 09/22/2015    Priority: Low   Adenomatous colon polyp     Priority: Low   Herpes simplex virus (HSV) infection 01/16/2010    Priority: Low   Deviated nasal septum 09/22/2023   Hypertrophy of nasal turbinates 09/22/2023   Postnasal drip 09/22/2023   Recurrent major depressive disorder, in partial remission (HCC) 09/06/2022   Influenza A 08/28/2022   COVID-19 07/31/2022   Hypokalemia 07/31/2022   Anemia  07/31/2022   Coronary artery disease involving native coronary artery of native heart without angina pectoris 07/23/2022   Precordial chest pain 11/06/2013    Medications- reviewed and updated Current Outpatient Medications  Medication Sig Dispense Refill   acetaminophen  (TYLENOL ) 650 MG CR tablet Take 1,300 mg by mouth every 8 (eight) hours as needed for pain.     albuterol  (VENTOLIN  HFA) 108 (90 Base) MCG/ACT inhaler Inhale 2 puffs into the lungs every 6 (six) hours as needed for wheezing or shortness of breath. 8 g 3   amLODipine  (NORVASC ) 2.5 MG tablet TAKE 1 TABLET BY MOUTH ONCE DAILY 90 tablet 3   amoxicillin -clavulanate (AUGMENTIN ) 875-125 MG tablet Take 1 tablet by mouth 2 (two) times daily for 7 days. 14 tablet 0   aspirin  EC 81 MG tablet Take 1 tablet (81 mg total) by mouth daily.     Budeson-Glycopyrrol-Formoterol  (BREZTRI  AEROSPHERE) 160-9-4.8 MCG/ACT AERO Inhale 2 puffs into the lungs in the morning and at bedtime. 1 each 11   buPROPion  (WELLBUTRIN  XL) 300 MG 24 hr tablet TAKE 1 TABLET BY MOUTH ONCE DAILY 30 tablet 10   Calcium  Carb-Cholecalciferol (CALCIUM  500/D PO) Take 2 tablets by mouth daily. Mag and zinc added     carboxymethylcellulose (REFRESH PLUS) 0.5 % SOLN Place 1 drop into both eyes 3 (three) times daily  as needed.     desvenlafaxine  (PRISTIQ ) 100 MG 24 hr tablet TAKE 1 TABLET BY MOUTH EVERY MORNING 30 tablet 10   dexlansoprazole  (DEXILANT ) 60 MG capsule Take 1 capsule (60 mg total) by mouth daily. 90 capsule 3   ezetimibe  (ZETIA ) 10 MG tablet TAKE 1 TABLET BY MOUTH ONCE DAILY 90 tablet 1   fluticasone  (CUTIVATE ) 0.05 % cream Apply 1 Application topically 2 (two) times daily as needed (eczema).     fluticasone  (FLONASE ) 50 MCG/ACT nasal spray Place 2 sprays into both nostrils as needed for allergies or rhinitis. 16 g 3   gabapentin  (NEURONTIN ) 300 MG capsule TAKE 1 CAPSULE BY MOUTH EVERY DAY AT BEDTIME 30 capsule 10   ipratropium-albuterol  (DUONEB) 0.5-2.5 (3) MG/3ML  SOLN Take 3 mLs by nebulization every 6 (six) hours as needed. 360 mL 4   isosorbide  mononitrate (IMDUR ) 30 MG 24 hr tablet TAKE 1/2 TABLET BY MOUTH ONCE DAILY 45 tablet 3   LORazepam  (ATIVAN ) 0.5 MG tablet TAKE ONE (1) TABLET BY MOUTH TWICE DAILY AS NEEDED FOR ANXIETY *DO NOT DRIVE FOR 8 HOURS AFTER USING 60 tablet 5   Multiple Vitamins-Minerals (MULTIVITAMIN ADULT PO) Take 1 tablet by mouth daily.     nitroGLYCERIN  (NITROSTAT ) 0.4 MG SL tablet Place 1 tablet (0.4 mg total) under the tongue every 5 (five) minutes as needed for chest pain (if pain does not resolve with 2 doses, call 911). 30 tablet 5   predniSONE  (DELTASONE ) 50 MG tablet Take 1 tablet (50 mg total) by mouth daily with breakfast. 5 tablet 0   rosuvastatin  (CRESTOR ) 40 MG tablet TAKE 1 TABLET BY MOUTH ONCE DAILY 30 tablet 10   triamcinolone  (NASACORT ) 55 MCG/ACT AERO nasal inhaler Place 1 spray into the nose daily. Start with 1 spray each side twice a day for 3 days, then reduce to daily. 1 each 2   valACYclovir  (VALTREX ) 1000 MG tablet TAKE 2 TABLETS BY MOUTH TWICE DAILY FOR 1 DAY AT FIRST SIGN OF COLD SORE *REFILL REQUEST* 30 tablet 10   azelastine  (ASTELIN ) 0.1 % nasal spray Place 2 sprays into both nostrils 2 (two) times daily. Use in each nostril as directed 30 mL 12   ipratropium (ATROVENT ) 0.06 % nasal spray Place 2 sprays into both nostrils 2 (two) times daily as needed for rhinitis (drainage). 15 mL 10   metoCLOPramide  (REGLAN ) 5 MG tablet Take 1 tablet (5 mg total) by mouth once for 1 dose. 2 tablet 0   No current facility-administered medications for this visit.     Objective:  BP 100/70   Pulse 73   Temp (!) 97.4 F (36.3 C)   Ht 5' 2.5" (1.588 m)   Wt 126 lb 12.8 oz (57.5 kg)   SpO2 90%   BMI 22.82 kg/m  Gen: NAD, resting comfortably CV: Normal heart rate.  Stable murmur. Lungs: CTAB no crackles, wheeze, rhonchi-DuoNeb use earlier in the day Ext: no edema-no calf pain or edema specifically Skin: warm,  dry  Results for orders placed or performed in visit on 01/06/24 (from the past 24 hours)  POC COVID-19     Status: None   Collection Time: 01/06/24  3:22 PM  Result Value Ref Range   SARS Coronavirus 2 Ag Negative Negative  POCT rapid strep A     Status: None   Collection Time: 01/06/24  3:22 PM  Result Value Ref Range   Rapid Strep A Screen Negative Negative  POCT Influenza A/B     Status:  None   Collection Time: 01/06/24  3:22 PM  Result Value Ref Range   Influenza A, POC Negative Negative   Influenza B, POC Negative Negative       Assessment and Plan   #COPD exacerbation concern S: Patient with history of Significant hospitalization December 2023 with influenza A causing respiratory failure.  Reports she was around someone sick at work within the last week.  Medication:still using Breztri  but hard to inhale, has DuoNebs available at home- not helping a ton but helps some with wheezing and shortness of breath-not using regularly but did use this morning -Reports increased dyspnea.  She also has a sore throat as well as sinus pressure and congestion - Increased cough and sputum production - Color changes sputum- some green and brown- has noted tinge of blood at times  -oxygen at home 88 or so  usually but can drop to 83.  -no leg swelling A/P: 73 year old female with history of COPD and CAD presents with shortness of breath, sinus pressure and congestion, cough, and decreased oxygen from her baseline without calf swelling or edema to suggest DVT/PE-concerning for significant COPD exacerbation.  COVID, flu, strep negative. - With home oxygen levels dropping in the mid 80s suggested emergency department but she declines this-had already declined by triage - Discussed EKG and troponins (could be high with COPD exacerbation anyway from demand ischemia) but she declines this today-she feels this is pulmonary and does not want to go to the hospital unless symptoms worsen.  Denies  exertional element of chest pain-more related to cough and chest congestion per her report.  She will continue her rosuvastatin  40 mg, Zetia , aspirin  81 mg - Chest x-ray to rule out pneumonia -Already starting Augmentin  for COPD exacerbation and would also cover sinusitis - Depo-Medrol  today and start prednisone  tomorrow morning 50 mg for 5 days -Encouraged DuoNebs every 4-6 hours at home including when she gets home as last usage was this morning -He also reports some fatigue in the weeks leading up to this and we will update blood work though discussed her acute illness could affect these results - Close follow-up on Thursday planned at 4 PM-sooner if needed -Also check A1c as has been close to diabetes range in the past to make sure A1c not significantly elevated which could contribute to fatigue -Even though she has initially declined ED visit she agrees to seek care if symptoms worsen any further or if she fails to improve  I was very blunt with patient that I am very concerned about her and that my preference would be for her to go to the emergency department-she continues to decline this option she is well aware of the risks such as as missing a heart attack as well as potential for more intensive interventions for her COPD as well as monitoring-she still declines.  She has a close friend with her today who recommended the same and to even take her to the emergency department but she declines  # Hypertension-blood pressure overcontrolled during acute illness-I told her she can hold her amlodipine  unless blood pressure rises when on prednisone -she will consider this  Recommended follow up: Return for thursday 4 pm- schedule on way out. Future Appointments  Date Time Provider Department Center  01/09/2024  4:00 PM Almira Jaeger, MD [facility]  03/17/2024  3:20 PM Almira Jaeger, MD [facility]  03/20/2024 11:40 AM [address] [facility] None  06/23/2024 11:20 AM [facility] ANNUAL  WELLNESS VISIT 1 [facility]  Lab/Order associations:   ICD-10-CM   1. COPD with acute exacerbation (HCC)  J44.1 methylPREDNISolone  acetate (DEPO-MEDROL ) injection 80 mg    2. Coronary artery disease involving native coronary artery of native heart with angina pectoris (HCC)  I25.119     3. Sore throat  J02.9 POC COVID-19    POCT rapid strep A    POCT Influenza A/B    4. Acute cough  R05.1 DG Chest 2 View    methylPREDNISolone  acetate (DEPO-MEDROL ) injection 80 mg    5. SOB (shortness of breath)  R06.02 DG Chest 2 View    Comprehensive metabolic panel with GFR    CBC with Differential/Platelet    6. Fatigue, unspecified type  R53.83 Comprehensive metabolic panel with GFR    CBC with Differential/Platelet    Hemoglobin A1c    TSH    7. Essential hypertension  I10       Meds ordered this encounter  Medications   predniSONE  (DELTASONE ) 50 MG tablet    Sig: Take 1 tablet (50 mg total) by mouth daily with breakfast.    Dispense:  5 tablet    Refill:  0   amoxicillin -clavulanate (AUGMENTIN ) 875-125 MG tablet    Sig: Take 1 tablet by mouth 2 (two) times daily for 7 days.    Dispense:  14 tablet    Refill:  0   methylPREDNISolone  acetate (DEPO-MEDROL ) injection 80 mg    Time Spent: 42 minutes of total time (3:24 PM- 4:06 PM) was spent on the date of the encounter performing the following actions: chart review prior to seeing the patient, obtaining history, performing a medically necessary exam, counseling on the importance of potential emergency department visit as well as with her declining this are outpatient treatment plan and still counseling on emergent precautions, placing orders, and documenting in our EHR.    Return precautions advised.  Clarisa Crooked, MD

## 2024-01-06 NOTE — Telephone Encounter (Signed)
 Called and lm for pt tcb. Dr. Arlene Ben willing to see pt if pt can get to the office between now and 2:00 pm OR 4pm. However there is a chance pt will still be sent to ER for further evaluation.

## 2024-01-07 ENCOUNTER — Encounter: Payer: Self-pay | Admitting: Family Medicine

## 2024-01-07 LAB — CBC WITH DIFFERENTIAL/PLATELET
Basophils Absolute: 0 10*3/uL (ref 0.0–0.1)
Basophils Relative: 0.5 % (ref 0.0–3.0)
Eosinophils Absolute: 0 10*3/uL (ref 0.0–0.7)
Eosinophils Relative: 0.3 % (ref 0.0–5.0)
HCT: 34.2 % — ABNORMAL LOW (ref 36.0–46.0)
Hemoglobin: 11 g/dL — ABNORMAL LOW (ref 12.0–15.0)
Lymphocytes Relative: 7.6 % — ABNORMAL LOW (ref 12.0–46.0)
Lymphs Abs: 0.6 10*3/uL — ABNORMAL LOW (ref 0.7–4.0)
MCHC: 32.2 g/dL (ref 30.0–36.0)
MCV: 91.4 fl (ref 78.0–100.0)
Monocytes Absolute: 1.2 10*3/uL — ABNORMAL HIGH (ref 0.1–1.0)
Monocytes Relative: 14.7 % — ABNORMAL HIGH (ref 3.0–12.0)
Neutro Abs: 6.1 10*3/uL (ref 1.4–7.7)
Neutrophils Relative %: 76.9 % (ref 43.0–77.0)
Platelets: 171 10*3/uL (ref 150.0–400.0)
RBC: 3.74 Mil/uL — ABNORMAL LOW (ref 3.87–5.11)
RDW: 15.5 % (ref 11.5–15.5)
WBC: 7.9 10*3/uL (ref 4.0–10.5)

## 2024-01-07 LAB — COMPREHENSIVE METABOLIC PANEL WITH GFR
ALT: 19 U/L (ref 0–35)
AST: 27 U/L (ref 0–37)
Albumin: 3.8 g/dL (ref 3.5–5.2)
Alkaline Phosphatase: 56 U/L (ref 39–117)
BUN: 9 mg/dL (ref 6–23)
CO2: 27 meq/L (ref 19–32)
Calcium: 8.7 mg/dL (ref 8.4–10.5)
Chloride: 92 meq/L — ABNORMAL LOW (ref 96–112)
Creatinine, Ser: 0.62 mg/dL (ref 0.40–1.20)
GFR: 87.53 mL/min (ref >60.00)
Glucose, Bld: 104 mg/dL — ABNORMAL HIGH (ref 70–99)
Potassium: 4 meq/L (ref 3.5–5.1)
Sodium: 128 meq/L — ABNORMAL LOW (ref 135–145)
Total Bilirubin: 0.5 mg/dL (ref 0.2–1.2)
Total Protein: 6.2 g/dL (ref 6.0–8.3)

## 2024-01-07 LAB — TSH: TSH: 0.46 u[IU]/mL (ref 0.35–5.50)

## 2024-01-07 LAB — HEMOGLOBIN A1C: Hgb A1c MFr Bld: 6.5 % (ref 4.6–6.5)

## 2024-01-09 ENCOUNTER — Ambulatory Visit (INDEPENDENT_AMBULATORY_CARE_PROVIDER_SITE_OTHER): Admitting: Family Medicine

## 2024-01-09 ENCOUNTER — Encounter: Payer: Self-pay | Admitting: Family Medicine

## 2024-01-09 VITALS — BP 124/70 | HR 69 | Temp 97.5°F | Ht 60.0 in | Wt 125.4 lb

## 2024-01-09 DIAGNOSIS — D649 Anemia, unspecified: Secondary | ICD-10-CM

## 2024-01-09 DIAGNOSIS — J449 Chronic obstructive pulmonary disease, unspecified: Secondary | ICD-10-CM

## 2024-01-09 DIAGNOSIS — E119 Type 2 diabetes mellitus without complications: Secondary | ICD-10-CM

## 2024-01-09 DIAGNOSIS — I10 Essential (primary) hypertension: Secondary | ICD-10-CM

## 2024-01-09 DIAGNOSIS — E871 Hypo-osmolality and hyponatremia: Secondary | ICD-10-CM

## 2024-01-09 NOTE — Patient Instructions (Addendum)
 Restart amlodipine  if home blood pressure gets above 135 again  Lets cut down on the sweets but eat good nutritious meals and stay hydrated  Please stop by lab before you go If you have mychart- we will send your results within 3 business days of us  receiving them.  If you do not have mychart- we will call you about results within 5 business days of us  receiving them.  *please also note that you will see labs on mychart as soon as they post. I will later go in and write notes on them- will say "notes from Dr. Arlene Ben"   Recommended follow up: Return for next already scheduled visit or sooner if needed.

## 2024-01-09 NOTE — Progress Notes (Signed)
 Phone [phone] In person visit   Subjective:   Katherine Foley is a 73 y.o. year old very pleasant female patient who presents for/with See problem oriented charting Chief Complaint  Patient presents with   Follow-up    Prednisone  is helping a little.     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    Priority: High   Depression 09/22/2015    Priority: High   Former smoker 09/22/2015    Priority: High   S/P AVR (aortic valve replacement) 11/06/2013    Priority: High   Aortic stenosis 07/07/2010    Priority: High   Coronary artery disease involving native coronary artery with angina pectoris (HCC) 05/17/2010    Priority: High   COPD (chronic obstructive pulmonary disease) (HCC) 07/23/2007    Priority: High   Aortic atherosclerosis (HCC) 03/10/2020    Priority: Medium    Restless legs 08/25/2019    Priority: Medium    Hot flashes 03/22/2016    Priority: Medium    Diastolic dysfunction 05/31/2015    Priority: Medium    GERD (gastroesophageal reflux disease) 06/04/2014    Priority: Medium    Carotid stenosis 05/17/2010    Priority: Medium    HLD (hyperlipidemia) 08/29/2009    Priority: Medium    Essential hypertension 04/14/2007    Priority: Medium    Osteopenia 04/14/2007    Priority: Medium    Genetic testing 02/10/2018    Priority: Low   Family history of breast cancer     Priority: Low   Allergic rhinitis 09/22/2015    Priority: Low   Adenomatous colon polyp     Priority: Low   Herpes simplex virus (HSV) infection 01/16/2010    Priority: Low   Deviated nasal septum 09/22/2023   Hypertrophy of nasal turbinates 09/22/2023   Postnasal drip 09/22/2023   Recurrent major depressive disorder, in partial remission (HCC) 09/06/2022   Influenza A 08/28/2022    COVID-19 07/31/2022   Hypokalemia 07/31/2022   Anemia 07/31/2022   Coronary artery disease involving native coronary artery of native heart without angina pectoris 07/23/2022   Precordial chest pain 11/06/2013    Medications- reviewed and updated Current Outpatient Medications  Medication Sig Dispense Refill   acetaminophen  (TYLENOL ) 650 MG CR tablet Take 1,300 mg by mouth every 8 (eight) hours as needed for pain.     albuterol  (VENTOLIN  HFA) 108 (90 Base) MCG/ACT inhaler Inhale 2 puffs into the lungs every 6 (six) hours as needed for wheezing or shortness of breath. 8 g 3   amoxicillin -clavulanate (AUGMENTIN ) 875-125 MG tablet Take 1 tablet by mouth 2 (two) times daily for 7 days. 14 tablet 0   aspirin  EC 81 MG tablet Take 1 tablet (81 mg total) by mouth daily.     Budeson-Glycopyrrol-Formoterol  (BREZTRI  AEROSPHERE) 160-9-4.8 MCG/ACT AERO Inhale 2 puffs into the lungs in the morning and at bedtime. 1 each 11   buPROPion  (WELLBUTRIN  XL) 300 MG 24 hr tablet TAKE 1 TABLET BY MOUTH ONCE DAILY 30 tablet 10   desvenlafaxine  (PRISTIQ ) 100 MG 24 hr tablet TAKE 1 TABLET BY MOUTH EVERY MORNING 30 tablet 10   dexlansoprazole  (DEXILANT ) 60 MG capsule Take 1 capsule (60 mg total) by mouth daily. 90 capsule 3   ezetimibe  (ZETIA ) 10 MG tablet TAKE 1  TABLET BY MOUTH ONCE DAILY 90 tablet 1   fluticasone  (CUTIVATE ) 0.05 % cream Apply 1 Application topically 2 (two) times daily as needed (eczema).     fluticasone  (FLONASE ) 50 MCG/ACT nasal spray Place 2 sprays into both nostrils as needed for allergies or rhinitis. 16 g 3   gabapentin  (NEURONTIN ) 300 MG capsule TAKE 1 CAPSULE BY MOUTH EVERY DAY AT BEDTIME 30 capsule 10   ipratropium-albuterol  (DUONEB) 0.5-2.5 (3) MG/3ML SOLN Take 3 mLs by nebulization every 6 (six) hours as needed. 360 mL 4   isosorbide  mononitrate (IMDUR ) 30 MG 24 hr tablet TAKE 1/2 TABLET BY MOUTH ONCE DAILY 45 tablet 3   LORazepam  (ATIVAN ) 0.5 MG tablet TAKE ONE (1) TABLET BY MOUTH TWICE  DAILY AS NEEDED FOR ANXIETY *DO NOT DRIVE FOR 8 HOURS AFTER USING 60 tablet 5   predniSONE  (DELTASONE ) 50 MG tablet Take 1 tablet (50 mg total) by mouth daily with breakfast. 5 tablet 0   rosuvastatin  (CRESTOR ) 40 MG tablet TAKE 1 TABLET BY MOUTH ONCE DAILY 30 tablet 10   triamcinolone  (NASACORT ) 55 MCG/ACT AERO nasal inhaler Place 1 spray into the nose daily. Start with 1 spray each side twice a day for 3 days, then reduce to daily. 1 each 2   valACYclovir  (VALTREX ) 1000 MG tablet TAKE 2 TABLETS BY MOUTH TWICE DAILY FOR 1 DAY AT FIRST SIGN OF COLD SORE *REFILL REQUEST* 30 tablet 10   amLODipine  (NORVASC ) 2.5 MG tablet TAKE 1 TABLET BY MOUTH ONCE DAILY (Patient not taking: Reported on 01/09/2024) 90 tablet 3   azelastine  (ASTELIN ) 0.1 % nasal spray Place 2 sprays into both nostrils 2 (two) times daily. Use in each nostril as directed 30 mL 12   Calcium  Carb-Cholecalciferol (CALCIUM  500/D PO) Take 2 tablets by mouth daily. Mag and zinc added (Patient not taking: Reported on 01/09/2024)     carboxymethylcellulose (REFRESH PLUS) 0.5 % SOLN Place 1 drop into both eyes 3 (three) times daily as needed. (Patient not taking: Reported on 01/09/2024)     ipratropium (ATROVENT ) 0.06 % nasal spray Place 2 sprays into both nostrils 2 (two) times daily as needed for rhinitis (drainage). 15 mL 10   metoCLOPramide  (REGLAN ) 5 MG tablet Take 1 tablet (5 mg total) by mouth once for 1 dose. 2 tablet 0   Multiple Vitamins-Minerals (MULTIVITAMIN ADULT PO) Take 1 tablet by mouth daily. (Patient not taking: Reported on 01/09/2024)     nitroGLYCERIN  (NITROSTAT ) 0.4 MG SL tablet Place 1 tablet (0.4 mg total) under the tongue every 5 (five) minutes as needed for chest pain (if pain does not resolve with 2 doses, call 911). (Patient not taking: Reported on 01/09/2024) 30 tablet 5   No current facility-administered medications for this visit.     Objective:  BP 124/70   Pulse 69   Temp (!) 97.5 F (36.4 C) (Temporal)   Ht 5' (1.524  m)   Wt 125 lb 6.4 oz (56.9 kg)   SpO2 90%   BMI 24.49 kg/m  Gen: Patient standing when I entered the room-she is able to walk around in the room without difficulty-appears much more comfortable than the prior visit CV: RRR stable murmur Lungs: CTAB no crackles, wheeze, rhonchi Abdomen: soft/nontender/nondistended/normal bowel sounds. No rebound or guarding.  Ext: no edema Skin: warm, dry     Assessment and Plan    # COPD exacerbation follow up  S:patient seen last visit with shortness of breath, sinus pressure, congestion, cough, decreased oxygen from baseline (88  when usually in low 90's) with negative COVID, flu, strep but concern for copd exacerbation -discussed ER option but she declined -she also declined EKG and troponin levels - on pneumonia on x-ray but suggestive of viral illness  We gave her depression medrol , started prednisone  and advised duonebs every 4-6 hours - she had some fatigue predating her symptoms and was found to have hyponatremia to 128- we wondered if this could be related to acute illness as well   Today reports oxygen at home up to 93- nothing down into low 80's like before. Was needing nebulizer more regularly at first - was using through yesterday evening but noted felt some nausea so stopped and hasn't used yet today- is feeling some wheezing. Still more winded than usual.  She has noted considerable improvement but is still not at her baseline nor close. A/P: COPD exacerbation improving on prednisone  50 mg and Augmentin  combination.  We have been very concerned she would need to go to the hospital but I think she is on the right track now-we did not schedule a subsequent follow-up but discussed importance of calling us  if fails to continue to improve  # Diabetes S: Medication: none  Exercise and diet- feels she could cut back on sweets- like [employer] bar Lab Results  Component Value Date   HGBA1C 6.5 01/06/2024   HGBA1C 6.5 05/28/2023   HGBA1C 6.2  (H) 12/14/2022    A/P: Discussed new diagnosis of diabetes-I think she can control this with diet and exercise-step 1 is certainly getting her through her current illness.  She feels she can cut down on sweets intake for instance but we can be more aggressive about control if needed in the future  #hypertension S: medication: She is still holding amlodipine  after last visit advised hold amlodipine .  Still on Imdur  15 mg BP Readings from Last 3 Encounters:  01/09/24 124/70  01/06/24 100/70  12/27/23 116/62  A/P: Blood pressure is well-controlled with Imdur  15 mg alone at this time-he agrees to restart if home blood pressure gets above 135   Recommended follow up: Return for next already scheduled visit or sooner if needed. Future Appointments  Date Time Provider Department Center  03/17/2024  3:20 PM Almira Jaeger, MD [facility]  03/20/2024 11:40 AM Reynold Caves, MD [facility] None  06/23/2024 11:20 AM [facility] ANNUAL WELLNESS VISIT 1 [facility]    Lab/Order associations:   ICD-10-CM   1. Hyponatremia  E87.1 Sodium, urine, random    Osmolality, urine    Osmolality    Comprehensive metabolic panel with GFR    2. Chronic obstructive pulmonary disease, unspecified COPD type (HCC)  J44.9     3. Anemia, unspecified type  D64.9 CBC with Differential/Platelet    4. Diabetes mellitus without complication (HCC)  E11.9 Microalbumin / creatinine urine ratio    5. Essential hypertension  I10       No orders of the defined types were placed in this encounter.   Return precautions advised.  Clarisa Crooked, MD

## 2024-01-10 ENCOUNTER — Encounter: Payer: Self-pay | Admitting: Family Medicine

## 2024-01-10 LAB — COMPREHENSIVE METABOLIC PANEL WITH GFR
ALT: 247 U/L — ABNORMAL HIGH (ref 0–35)
AST: 214 U/L — ABNORMAL HIGH (ref 0–37)
Albumin: 4 g/dL (ref 3.5–5.2)
Alkaline Phosphatase: 85 U/L (ref 39–117)
BUN: 11 mg/dL (ref 6–23)
CO2: 28 meq/L (ref 19–32)
Calcium: 9 mg/dL (ref 8.4–10.5)
Chloride: 96 meq/L (ref 96–112)
Creatinine, Ser: 0.55 mg/dL (ref 0.40–1.20)
GFR: 90.09 mL/min (ref >60.00)
Glucose, Bld: 119 mg/dL — ABNORMAL HIGH (ref 70–99)
Potassium: 4.4 meq/L (ref 3.5–5.1)
Sodium: 133 meq/L — ABNORMAL LOW (ref 135–145)
Total Bilirubin: 0.3 mg/dL (ref 0.2–1.2)
Total Protein: 6.6 g/dL (ref 6.0–8.3)

## 2024-01-10 LAB — CBC WITH DIFFERENTIAL/PLATELET
Basophils Absolute: 0 10*3/uL (ref 0.0–0.1)
Basophils Relative: 0.3 % (ref 0.0–3.0)
Eosinophils Absolute: 0 10*3/uL (ref 0.0–0.7)
Eosinophils Relative: 0.1 % (ref 0.0–5.0)
HCT: 34.9 % — ABNORMAL LOW (ref 36.0–46.0)
Hemoglobin: 11.3 g/dL — ABNORMAL LOW (ref 12.0–15.0)
Lymphocytes Relative: 5.2 % — ABNORMAL LOW (ref 12.0–46.0)
Lymphs Abs: 0.4 10*3/uL — ABNORMAL LOW (ref 0.7–4.0)
MCHC: 32.4 g/dL (ref 30.0–36.0)
MCV: 89.4 fl (ref 78.0–100.0)
Monocytes Absolute: 0.3 10*3/uL (ref 0.1–1.0)
Monocytes Relative: 3.1 % (ref 3.0–12.0)
Neutro Abs: 7.8 10*3/uL — ABNORMAL HIGH (ref 1.4–7.7)
Neutrophils Relative %: 91.3 % — ABNORMAL HIGH (ref 43.0–77.0)
Platelets: 251 10*3/uL (ref 150.0–400.0)
RBC: 3.91 Mil/uL (ref 3.87–5.11)
RDW: 14.8 % (ref 11.5–15.5)
WBC: 8.5 10*3/uL (ref 4.0–10.5)

## 2024-01-10 LAB — MICROALBUMIN / CREATININE URINE RATIO
Creatinine,U: 39.4 mg/dL
Microalb Creat Ratio: 22.3 mg/g (ref 0.0–30.0)
Microalb, Ur: 0.9 mg/dL (ref 0.0–1.9)

## 2024-01-11 LAB — OSMOLALITY: Osmolality: 272 mosm/kg — ABNORMAL LOW (ref 278–305)

## 2024-01-11 LAB — OSMOLALITY, URINE: Osmolality, Ur: 218 mosm/kg (ref 50–1200)

## 2024-01-11 LAB — SODIUM, URINE, RANDOM: Sodium, Ur: 12 mmol/L — ABNORMAL LOW (ref 28–272)

## 2024-01-13 ENCOUNTER — Other Ambulatory Visit: Payer: Self-pay

## 2024-01-13 DIAGNOSIS — R7401 Elevation of levels of liver transaminase levels: Secondary | ICD-10-CM

## 2024-01-20 ENCOUNTER — Ambulatory Visit (INDEPENDENT_AMBULATORY_CARE_PROVIDER_SITE_OTHER): Admitting: Family Medicine

## 2024-01-20 ENCOUNTER — Telehealth: Payer: Self-pay

## 2024-01-20 ENCOUNTER — Encounter: Payer: Self-pay | Admitting: Family Medicine

## 2024-01-20 VITALS — BP 120/72 | HR 74 | Temp 97.2°F | Ht 60.0 in | Wt 122.6 lb

## 2024-01-20 DIAGNOSIS — J449 Chronic obstructive pulmonary disease, unspecified: Secondary | ICD-10-CM

## 2024-01-20 DIAGNOSIS — B009 Herpesviral infection, unspecified: Secondary | ICD-10-CM

## 2024-01-20 DIAGNOSIS — J441 Chronic obstructive pulmonary disease with (acute) exacerbation: Secondary | ICD-10-CM

## 2024-01-20 MED ORDER — PREDNISONE 20 MG PO TABS: ORAL_TABLET | ORAL | 0 refills | Status: DC

## 2024-01-20 MED ORDER — AMOXICILLIN-POT CLAVULANATE 875-125 MG PO TABS: 1.0000 | ORAL_TABLET | Freq: Two times a day (BID) | ORAL | 0 refills | Status: DC

## 2024-01-20 NOTE — Patient Instructions (Addendum)
 Augmentin  for 10 days this time- your sinuses appear irritated in addition to your lungs Prednisone  to help calm down the copd flare up  We have placed a referral for you today to pulmonary - please call their # if you do not hear within a week (may be listed below or you may see mychart message within a few days with #).   Recommended follow up: Return for next already scheduled visit or sooner if needed.

## 2024-01-20 NOTE — Telephone Encounter (Signed)
 Lets see her at 420 to reevaluate today-thankfully her oxygen is staying above 88%

## 2024-01-20 NOTE — Telephone Encounter (Signed)
 Pt c/o coughing up and blowing out yellow that started yesterday. She states this morning her o2  was %92. Pt is unsure of what you would like her to do for treatment.   Copied from CRM [id number]. Topic: General - Other >> Jan 20, 2024  8:10 AM Lot[facility] [place] wrote: Reason for CRM: pt called in because she wanted to let Dr.Hunter know that she is not doing any better . She would like to know what to now . If there is anyway Dr.Hunter or his nurse can give a call would be great

## 2024-01-20 NOTE — Progress Notes (Signed)
 Phone [phone] In person visit   Subjective:   Katherine Foley is a 73 y.o. year old very pleasant female patient who presents for/with See problem oriented charting Chief Complaint  Patient presents with   Cough    Pt c/o continued chest congestion/productive cough with yellow mucous. Thought she was getting better and got worse Saturday night.   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    Priority: High   Depression 09/22/2015    Priority: High   Former smoker 09/22/2015    Priority: High   S/P AVR (aortic valve replacement) 11/06/2013    Priority: High   Aortic stenosis 07/07/2010    Priority: High   Coronary artery disease involving native coronary artery with angina pectoris (HCC) 05/17/2010    Priority: High   COPD (chronic obstructive pulmonary disease) (HCC) 07/23/2007    Priority: High   Aortic atherosclerosis (HCC) 03/10/2020    Priority: Medium    Restless legs 08/25/2019    Priority: Medium    Hot flashes 03/22/2016    Priority: Medium    Diastolic dysfunction 05/31/2015    Priority: Medium    GERD (gastroesophageal reflux disease) 06/04/2014    Priority: Medium    Carotid stenosis 05/17/2010    Priority: Medium    HLD (hyperlipidemia) 08/29/2009    Priority: Medium    Essential hypertension 04/14/2007    Priority: Medium    Osteopenia 04/14/2007    Priority: Medium    Genetic testing 02/10/2018    Priority: Low   Family history of breast cancer     Priority: Low   Allergic rhinitis 09/22/2015    Priority: Low   Adenomatous colon polyp     Priority: Low   Herpes simplex virus (HSV) infection 01/16/2010    Priority: Low   Deviated nasal septum 09/22/2023   Hypertrophy of  nasal turbinates 09/22/2023   Postnasal drip 09/22/2023   Recurrent major depressive disorder, in partial remission (HCC) 09/06/2022   Influenza A 08/28/2022   COVID-19 07/31/2022   Hypokalemia 07/31/2022   Anemia 07/31/2022   Coronary artery disease involving native coronary artery of native heart without angina pectoris 07/23/2022   Precordial chest pain 11/06/2013    Medications- reviewed and updated Current Outpatient Medications  Medication Sig Dispense Refill   amoxicillin -clavulanate (AUGMENTIN ) 875-125 MG tablet Take 1 tablet by mouth 2 (two) times daily for 10 days. 20 tablet 0   predniSONE  (DELTASONE ) 20 MG tablet Take 2 pills for 3 days, 1 pill for 4 days 10 tablet 0   acetaminophen  (TYLENOL ) 650 MG CR tablet Take 1,300 mg by mouth every 8 (eight) hours as needed for pain.     albuterol  (VENTOLIN  HFA) 108 (90 Base) MCG/ACT inhaler Inhale 2 puffs into the lungs every 6 (six) hours as needed for wheezing or shortness of breath. 8 g 3   amLODipine  (NORVASC ) 2.5 MG tablet TAKE 1 TABLET BY MOUTH ONCE DAILY (Patient not taking: Reported on 01/09/2024) 90 tablet 3   aspirin  EC 81 MG tablet Take 1 tablet (81 mg total) by mouth daily.     azelastine  (ASTELIN ) 0.1 % nasal spray Place 2 sprays into both nostrils 2 (two)  times daily. Use in each nostril as directed 30 mL 12   Budeson-Glycopyrrol-Formoterol  (BREZTRI  AEROSPHERE) 160-9-4.8 MCG/ACT AERO Inhale 2 puffs into the lungs in the morning and at bedtime. 1 each 11   buPROPion  (WELLBUTRIN  XL) 300 MG 24 hr tablet TAKE 1 TABLET BY MOUTH ONCE DAILY 30 tablet 10   Calcium  Carb-Cholecalciferol (CALCIUM  500/D PO) Take 2 tablets by mouth daily. Mag and zinc added (Patient not taking: Reported on 01/09/2024)     carboxymethylcellulose (REFRESH PLUS) 0.5 % SOLN Place 1 drop into both eyes 3 (three) times daily as needed. (Patient not taking: Reported on 01/09/2024)     desvenlafaxine  (PRISTIQ ) 100 MG 24 hr tablet TAKE 1 TABLET BY MOUTH EVERY MORNING 30  tablet 10   dexlansoprazole  (DEXILANT ) 60 MG capsule Take 1 capsule (60 mg total) by mouth daily. 90 capsule 3   ezetimibe  (ZETIA ) 10 MG tablet TAKE 1 TABLET BY MOUTH ONCE DAILY 90 tablet 1   fluticasone  (CUTIVATE ) 0.05 % cream Apply 1 Application topically 2 (two) times daily as needed (eczema).     fluticasone  (FLONASE ) 50 MCG/ACT nasal spray Place 2 sprays into both nostrils as needed for allergies or rhinitis. 16 g 3   gabapentin  (NEURONTIN ) 300 MG capsule TAKE 1 CAPSULE BY MOUTH EVERY DAY AT BEDTIME 30 capsule 10   ipratropium (ATROVENT ) 0.06 % nasal spray Place 2 sprays into both nostrils 2 (two) times daily as needed for rhinitis (drainage). 15 mL 10   ipratropium-albuterol  (DUONEB) 0.5-2.5 (3) MG/3ML SOLN Take 3 mLs by nebulization every 6 (six) hours as needed. 360 mL 4   isosorbide  mononitrate (IMDUR ) 30 MG 24 hr tablet TAKE 1/2 TABLET BY MOUTH ONCE DAILY 45 tablet 3   LORazepam  (ATIVAN ) 0.5 MG tablet TAKE ONE (1) TABLET BY MOUTH TWICE DAILY AS NEEDED FOR ANXIETY *DO NOT DRIVE FOR 8 HOURS AFTER USING 60 tablet 5   metoCLOPramide  (REGLAN ) 5 MG tablet Take 1 tablet (5 mg total) by mouth once for 1 dose. 2 tablet 0   Multiple Vitamins-Minerals (MULTIVITAMIN ADULT PO) Take 1 tablet by mouth daily. (Patient not taking: Reported on 01/09/2024)     nitroGLYCERIN  (NITROSTAT ) 0.4 MG SL tablet Place 1 tablet (0.4 mg total) under the tongue every 5 (five) minutes as needed for chest pain (if pain does not resolve with 2 doses, call 911). (Patient not taking: Reported on 01/09/2024) 30 tablet 5   rosuvastatin  (CRESTOR ) 40 MG tablet TAKE 1 TABLET BY MOUTH ONCE DAILY 30 tablet 10   triamcinolone  (NASACORT ) 55 MCG/ACT AERO nasal inhaler Place 1 spray into the nose daily. Start with 1 spray each side twice a day for 3 days, then reduce to daily. 1 each 2   valACYclovir  (VALTREX ) 1000 MG tablet TAKE 2 TABLETS BY MOUTH TWICE DAILY FOR 1 DAY AT FIRST SIGN OF COLD SORE *REFILL REQUEST* 30 tablet 10   No current  facility-administered medications for this visit.     Objective:  BP 120/72   Pulse 74   Temp (!) 97.2 F (36.2 C)   Ht 5' (1.524 m)   Wt 122 lb 9.6 oz (55.6 kg)   SpO2 94%   BMI 23.94 kg/m  Gen: NAD, coughs up yellow sputum during exam Maxillary and frontal sinus tenderness, nasal turbinates erythematous with yellow drainage CV: RRR no murmurs rubs or gallops Lungs: Diffuse wheeze and some rhonchi-no crackles Abdomen: soft/nontender/nondistended/normal bowel sounds. No rebound or guarding.  Ext: Minimal edema Skin: warm, dry     Assessment and  Plan   # COPD exacerbation follow-up S: Patient was last seen on 01/09/2024 after originally being seen on 01/06/2024 for COPD exacerbation-at that point she had significant improvement on prednisone  50 mg for 5 days as well as Augmentin .  Originally we are very concerned she may need to be hospitalized but she had rather significant improvement.  She felt like she was getting much better and then on Saturday night symptoms worsened again-she has noted more chest congestion as well as a productive cough with yellow mucus  More yellow sputum production, new sinus pressure and blowing out yellow as well. Oxygen levels thankfully are doing ok. Feels more winded and fatigued. Wheezing more again. No fever.  No chest pain other than chest wall pain when she coughs A/P: Patient with COPD exacerbation starting over 2 weeks ago with significant improvement with prednisone  50 mg x 5 days and Augmentin  for 7 days his symptoms had significantly improved before worsening on Friday into Saturday-starting with sore throat and then nasal congestion and developing into chest congestion-appears to be may have started with sinusitis possibly in the background of lingering viral sinusitis or inadequately treated bacterial sinusitis-will treat with Augmentin  for another10 days-has agitated her COPD and we will treat that with another round of prednisone  to do 7 days  instead -She does have new diagnosis of diabetes and we will need to address that further on follow-up in July including health maintenance items-I think we really need the prednisone  though -Has not used her nebulizer today and I encouraged her to go home and do so-wheezing on exam today -With recurrent exacerbations also refer back to pulmonary-has been over a year since last visit  # Blister buttocks S: She shows me a picture of a blister on her buttocks that developed recently-appears to be vesicular-she started her Valtrex  this morning A/P: Could be herpetic-I agree with use of Valtrex -continue current medication  Recommended follow up: Return for next already scheduled visit or sooner if needed. Future Appointments  Date Time Provider Department Center  02/04/2024  8:30 AM [facility]  03/17/2024  3:20 PM Almira Jaeger, MD [facility]  03/20/2024 11:40 AM Reynold Caves, MD [facility] None  06/23/2024 11:20 AM [facility] ANNUAL WELLNESS VISIT 1 [facility]    Lab/Order associations:   ICD-10-CM   1. COPD exacerbation (HCC)  J44.1     2. Chronic obstructive pulmonary disease, unspecified COPD type (HCC)  J44.9 Ambulatory referral to Pulmonology    3. Herpetic lesion  B00.9       Meds ordered this encounter  Medications   predniSONE  (DELTASONE ) 20 MG tablet    Sig: Take 2 pills for 3 days, 1 pill for 4 days    Dispense:  10 tablet    Refill:  0   amoxicillin -clavulanate (AUGMENTIN ) 875-125 MG tablet    Sig: Take 1 tablet by mouth 2 (two) times daily for 10 days.    Dispense:  20 tablet    Refill:  0    Return precautions advised.  Clarisa Crooked, MD

## 2024-01-20 NOTE — Telephone Encounter (Signed)
 Admin see below.

## 2024-01-21 DIAGNOSIS — F4381 Prolonged grief disorder: Secondary | ICD-10-CM | POA: Diagnosis not present

## 2024-01-22 ENCOUNTER — Ambulatory Visit: Payer: Self-pay

## 2024-01-22 ENCOUNTER — Encounter: Payer: Self-pay | Admitting: Family Medicine

## 2024-01-22 NOTE — Telephone Encounter (Signed)
  Chief Complaint: Rash/itchiness Symptoms: small red bumps on arms with itchiness Frequency: since Monday Pertinent Negatives: Patient denies difficulty breathing, throat swelling Disposition: [] ED /[] Urgent Care (no appt availability in office) / [] Appointment(In office/virtual)/ []  [facility]/ [] Home Care/ [] Refused Recommended Disposition /[] [facility]/ [x]  Follow-up with PCP Additional Notes: Patient called in reporting she is having itchiness all over and a rash on her arms. Patient states this started after she started the Augmentin  she was prescribed on Monday. Patient states this is the same reaction she had when she was allergic to Codeine. Patient denies throat swelling or difficulty breathing, and educated to call 911 immediately if either occur. Patient states she has not taken any Augmentin  today and wants clarification on how to move forward - or if Dr. Arlene Ben will call in a different type of antibiotic. Please advise and call patient.   Copied from CRM [id number]. Topic: Clinical - Red Word Triage >> Jan 22, 2024 10:53 AM Magdalene [employer] wrote: Red Word that prompted transfer to Nurse Triage: Itching all over body and rash under skin on arms. Reason for Disposition  Mild localized rash  Answer Assessment - Initial Assessment Questions 1. APPEARANCE of RASH: "Describe the rash."      Red spots 2. LOCATION: "Where is the rash located?"      Arms 3. NUMBER: "How many spots are there?"      A lot of small red dots 4. SIZE: "How big are the spots?" (Inches, centimeters or compare to size of a coin)      Tiny small red dots 5. ONSET: "When did the rash start?"      Monday 6. ITCHING: "Does the rash itch?" If Yes, ask: "How bad is the itch?"  (Scale 0-10; or none, mild, moderate, severe)     ranges 7. PAIN: "Does the rash hurt?" If Yes, ask: "How bad is the pain?"  (Scale 0-10; or none, mild, moderate, severe)    - NONE (0): no pain    - MILD (1-3): doesn't  interfere with normal activities     - MODERATE (4-7): interferes with normal activities or awakens from sleep     - SEVERE (8-10): excruciating pain, unable to do any normal activities     No pain 8. OTHER SYMPTOMS: "Do you have any other symptoms?" (e.g., fever)     No  Protocols used: Rash or Redness - Localized-A-AH

## 2024-01-23 ENCOUNTER — Ambulatory Visit (INDEPENDENT_AMBULATORY_CARE_PROVIDER_SITE_OTHER): Admitting: Family Medicine

## 2024-01-23 ENCOUNTER — Encounter: Payer: Self-pay | Admitting: Family Medicine

## 2024-01-23 VITALS — BP 132/68 | HR 70 | Temp 98.3°F | Ht 60.0 in | Wt 125.4 lb

## 2024-01-23 DIAGNOSIS — J441 Chronic obstructive pulmonary disease with (acute) exacerbation: Secondary | ICD-10-CM | POA: Diagnosis not present

## 2024-01-23 DIAGNOSIS — R21 Rash and other nonspecific skin eruption: Secondary | ICD-10-CM

## 2024-01-23 DIAGNOSIS — B379 Candidiasis, unspecified: Secondary | ICD-10-CM

## 2024-01-23 DIAGNOSIS — T3695XA Adverse effect of unspecified systemic antibiotic, initial encounter: Secondary | ICD-10-CM | POA: Diagnosis not present

## 2024-01-23 MED ORDER — FEXOFENADINE HCL 180 MG PO TABS: 180.0000 mg | ORAL_TABLET | Freq: Every day | ORAL | 0 refills | Status: DC

## 2024-01-23 MED ORDER — AZITHROMYCIN 250 MG PO TABS: ORAL_TABLET | ORAL | 0 refills | Status: AC

## 2024-01-23 MED ORDER — FLUCONAZOLE 150 MG PO TABS: 150.0000 mg | ORAL_TABLET | Freq: Once | ORAL | 0 refills | Status: AC

## 2024-01-23 NOTE — Telephone Encounter (Signed)
 Please add to allergy  list and see how she is feeling- if improving I may have her remain on the prednisone  alone through weekend

## 2024-01-23 NOTE — Progress Notes (Signed)
 Established Patient Office Visit   Subjective  Patient ID: Katherine Foley, female    DOB: [birth year]  Age: 73 y.o. MRN: [id number]  Chief Complaint  Patient presents with   Allergic Reaction    pat   Medical Management of Chronic Issues    Patient came in today for Allergic reaction to Augmentin , rash and itching    Pt accompanied by her friend.  Patient is a 73 year old female followed by Dr. Arlene Ben and seen for acute concern.  Pt given Augmentin  and prednisone  x 2 for COPD exacerbation then concern for sinusitis.  Pt stopped taking Augmentin  3 days ago due to concern for allergic reaction after developing fine red bumps on bilateral arms, forearms, and abdomen.  Rash is pruritic.  Has not tried anything for it.  Using Breztri  inhaler twice daily and nebulizer twice daily.  Still having productive cough, wheezing, chest congestion.  Patient endorses vaginal irritation.    Patient Active Problem List   Diagnosis Date Noted   Diabetes mellitus without complication (HCC) 01/09/2024   Deviated nasal septum 09/22/2023   Hypertrophy of nasal turbinates 09/22/2023   Postnasal drip 09/22/2023   Recurrent major depressive disorder, in partial remission (HCC) 09/06/2022   Influenza A 08/28/2022   COVID-19 07/31/2022   Hypokalemia 07/31/2022   Anemia 07/31/2022   Coronary artery disease involving native coronary artery of native heart without angina pectoris 07/23/2022   Aortic atherosclerosis (HCC) 03/10/2020   Restless legs 08/25/2019   H/O parotidectomy 04/28/2019   Genetic testing 02/10/2018   Family history of breast cancer    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   Hot flashes 03/22/2016   Allergic rhinitis 09/22/2015   Depression 09/22/2015   Former smoker 09/22/2015   Adenomatous colon polyp    Diastolic dysfunction 05/31/2015   GERD (gastroesophageal reflux disease) 06/04/2014   S/P AVR (aortic valve replacement) 11/06/2013   Precordial chest pain 11/06/2013   Aortic  stenosis 07/07/2010   Coronary artery disease involving native coronary artery with angina pectoris (HCC) 05/17/2010   Carotid stenosis 05/17/2010   Herpes simplex virus (HSV) infection 01/16/2010   HLD (hyperlipidemia) 08/29/2009   COPD (chronic obstructive pulmonary disease) (HCC) 07/23/2007   Essential hypertension 04/14/2007   Osteopenia 04/14/2007   Past Medical History:  Diagnosis Date   Adenomatous colon polyp    Angio-edema    Anxiety    Anxiety/Depression 02/15/2009   Aortic stenosis s/p bioprosthetic AVR 07/07/2010   Echo 9/19:  Mild conc LVH, EF 60-65, no RWMA, Gr 1 DD, AVR ok (mean 10, peak 21), trivial MR, normal RVSF, mild TR // Echo 10/22: EF 60-65, no RWMA, normal RVSF, AVR with increased gradients (mean 24 increased from 10 in 2019) // Echocardiogram 10/23: EF 60-65, no RWMA, mildly reduced RVSF, AVR w mean gradient 17.3 mmHg   Asthma    Barrett's esophagus    04/2007; 2000   Carotid stenosis 05/17/2010   a. dopplers 11/11: RICA 40-50%; LICA 0-39% (follow up due 11/12)   COPD 07/23/2007   Coronary Artery Disease 05/17/2010   NSTEMI 8/11 - BMS to RCA;  cath 4/12: dLAD 50%, RI 80% (PCI), AVCFX 30%, pRCA 30%, stent ok, 40-50, then 50% dist RCA;  c. s/p Promus DES to RI 12/25/10;  d. 04/2011 CABG x 1 (LIMA->LAD) @ time of AVR // Cath in 9/19: RI and RCA stents ok; L-LAD atretic, mild disease in mLAD >> med Rx   Eczema    Family history of breast cancer  2 daughters; PALB2 mutation in family   Genetic testing 02/10/2018   PALB2 analysis at [facility] - Negative for familial mutation   GERD    H/O hiatal hernia    Hemorrhoid thrombosis    "had it lanced" (03/25/2013)   Herpes simplex without mention of complication 01/16/2010   History of blood transfusion    'w/OHS" (03/25/2013)   Hyperlipidemia 08/29/2009   Hypertension 04/14/2007   Migraine HAs 04/20/2008   Myocardial infarction [facility]) 2011   "during catherization" (03/25/2013)   OSTEOPENIA 04/14/2007   Ovarian cyst     Parotid mass    left   Pneumonia    "multiple times" (03/25/2013)   Recurrent upper respiratory infection (URI)    Urine, incontinence, stress female    Past Surgical History:  Procedure Laterality Date   ADENOIDECTOMY     AORTIC VALVE REPLACEMENT  09/03/2010   APPENDECTOMY     BLEPHAROPLASTY Bilateral ~ 1998   CARDIAC CATHETERIZATION N/A 07/06/2016   Procedure: Left Heart Cath and Cors/Grafts Angiography;  Surgeon: Arnoldo Lapping, MD;  Location[facility] INVASIVE CV LAB;  Service: Cardiovascular;  Laterality: N/A;   CHOLECYSTECTOMY N/A 03/25/2013   Procedure: LAPAROSCOPIC CHOLECYSTECTOMY WITH INTRAOPERATIVE CHOLANGIOGRAM;  Surgeon: Kari Otto. Eli Grizzle, MD;  Location: [facility] OR;  Service: General;  Laterality: N/A;   CORONARY ANGIOGRAPHY N/A 05/26/2018   Procedure: CORONARY ANGIOGRAPHY (CATH LAB);  Surgeon: Lucendia Rusk, MD;  Location: [facility] INVASIVE CV LAB;  Service: Cardiovascular;  Laterality: N/A;   CORONARY ANGIOPLASTY WITH STENT PLACEMENT  09/03/2008   "I have 2 stents" (03/25/2013)   NASAL SINUS SURGERY  1997; 1998   "cust in maxillary sinus; put a stent in then removed it" (03/25/2013)   ORIF ANKLE FRACTURE Right 02/14/2021   Procedure: OPEN TREATMENT OF RIGHT TRIMALLEOLAR ANKLE FRACTURE WITHOUT POSTERIOR FIXATION, SYNDESMOSIS;  Surgeon: Donnamarie Gables, MD;  Location: [facility];  Service: Orthopedics;  Laterality: Right;   OVARIAN CYST REMOVAL Right 09/03/2009   "size of a soccer ball" (03/25/2013)   PAROTIDECTOMY Left 04/28/2019   Procedure: LEFT TOTAL PAROTIDECTOMY;  Surgeon: Reynold Caves, MD;  Location: [facility];  Service: ENT;  Laterality: Left;   RIGHT HEART CATH AND CORONARY ANGIOGRAPHY N/A 07/23/2022   Procedure: RIGHT HEART CATH AND CORONARY ANGIOGRAPHY;  Surgeon: Odie Benne, MD;  Location: [facility] INVASIVE CV LAB;  Service: Cardiovascular;  Laterality: N/A;   thrombosed hemorrohid lanced     TONSILLECTOMY AND ADENOIDECTOMY     TOTAL  ABDOMINAL HYSTERECTOMY     Social History   Tobacco Use   Smoking status: Former    Current packs/day: 0.00    Average packs/day: 1.5 packs/day for 44.0 years (66.0 ttl pk-yrs)    Types: Cigarettes    Start date: 09/03/1961    Quit date: 09/03/2005    Years since quitting: 18.4   Smokeless tobacco: Never  Vaping Use   Vaping status: Never Used  Substance Use Topics   Alcohol use: Not Currently    Alcohol/week: 0.0 standard drinks of alcohol    Comment: social   Drug use: No   Family History  Problem Relation Age of Onset   Heart disease Mother    COPD Father        smoked   Heart disease Father    Rheum arthritis Father    Allergic rhinitis Sister    Cirrhosis Sister    Stroke Brother    Heart disease Maternal Uncle    Breast cancer Daughter  currently 65   Breast cancer Daughter 31       currently 61; PALB2 mutation   Asthma Son        as a child    Coronary artery disease Other    Heart disease Other        6 stents   Cancer Other        very distant paternal cousin; pancreatic ca   Colon cancer Neg Hx    Colon polyps Neg Hx    Allergies  Allergen Reactions   Codeine Sulfate Itching and Rash   Hydrocodone-Acetaminophen  Itching and Rash   Augmentin  [Amoxicillin -Pot Clavulanate] Rash    ROS Negative unless stated above    Objective:      BP 132/68 (BP Location: Left Arm, Patient Position: Sitting, Cuff Size: Normal)   Pulse 70   Temp 98.3 F (36.8 C) (Oral)   Ht 5' (1.524 m)   Wt 125 lb 6.4 oz (56.9 kg)   SpO2 95%   BMI 24.49 kg/m  BP Readings from Last 3 Encounters:  01/23/24 132/68  01/20/24 120/72  01/09/24 124/70   Wt Readings from Last 3 Encounters:  01/23/24 125 lb 6.4 oz (56.9 kg)  01/20/24 122 lb 9.6 oz (55.6 kg)  01/09/24 125 lb 6.4 oz (56.9 kg)      Physical Exam Constitutional:      General: She is not in acute distress.    Appearance: Normal appearance.  HENT:     Head: Normocephalic and atraumatic.     Nose: Nose  normal.     Mouth/Throat:     Mouth: Mucous membranes are moist.  Cardiovascular:     Rate and Rhythm: Normal rate and regular rhythm.     Heart sounds: Normal heart sounds. No murmur heard.    No gallop.  Pulmonary:     Effort: Pulmonary effort is normal. No respiratory distress.     Breath sounds: Wheezing and rhonchi present. No rales.     Comments: Diffuse rhonchi wheezing throughout bilateral lung fields. Skin:    General: Skin is warm and dry.     Findings: Rash present. Rash is purpuric and urticarial.     Comments: Several areas pinpoint erythema on dorsum of left hand, left forearm, right arm, mid abdomen.  Neurological:     Mental Status: She is alert and oriented to person, place, and time.        01/06/2024    3:23 PM 09/20/2023    3:19 PM 09/02/2023   11:32 AM  Depression screen PHQ 2/9  Decreased Interest 0 1 3  Down, Depressed, Hopeless 0 1 3  PHQ - 2 Score 0 2 6  Altered sleeping 0 3 3  Tired, decreased energy 0 3 3  Change in appetite 0 2 3  Feeling bad or failure about yourself  0 2 2  Trouble concentrating 0 2 2  Moving slowly or fidgety/restless 0 1 1  Suicidal thoughts 0 1 1  PHQ-9 Score 0 16 21  Difficult doing work/chores Not difficult at all Not difficult at all       09/20/2023    3:19 PM 03/08/2023    8:11 AM 10/28/2020    3:53 PM 12/09/2017    3:17 PM  GAD 7 : Generalized Anxiety Score  Nervous, Anxious, on Edge 2 2 3 3   Control/stop worrying 2 2 3 3   Worry too much - different things 2 3 3 3   Trouble relaxing 3 2 3  2  Restless 0 2 3 3   Easily annoyed or irritable 1 1 3 2   Afraid - awful might happen 1 0 1 3  Total GAD 7 Score 11 12 19 19   Anxiety Difficulty Somewhat difficult Somewhat difficult Extremely difficult Somewhat difficult     No results found for any visits on 01/23/24.    Assessment & Plan:   Allergic reaction due to antibacterial drug -     Fexofenadine HCl; Take 1 tablet (180 mg total) by mouth daily. For itching.   Dispense: 10 tablet; Refill: 0  COPD exacerbation (HCC) -     Azithromycin ; Take 2 tablets on day 1, then 1 tablet daily on days 2 through 5  Dispense: 6 tablet; Refill: 0  Rash  Antibiotic-induced yeast infection -     Fluconazole ; Take 1 tablet (150 mg total) by mouth once for 1 dose.  Dispense: 1 tablet; Refill: 0  Pruritus and rash likely from Augmentin  use.  Okay to use Allegra or other OTC antihistamine for pruritus.  Agree with stopping medication.  Continue prednisone .  Start azithromycin  for COPD exacerbation.  Continue nebulizer and inhaler.  Patient to contact pulmonology to set up appointment.  Diflucan  for vaginal irritation likely due to antibiotic induced yeast infection.  Given strict precautions for continued or worsening symptoms.  Given note for work.  Return if symptoms worsen or fail to improve.   Viola Greulich, MD

## 2024-01-23 NOTE — Telephone Encounter (Signed)
 Spoke with Patient. Please see previous telephone note. Scheduled with Dr. Arliss Lam 01/23/24 (today)

## 2024-01-28 ENCOUNTER — Ambulatory Visit (INDEPENDENT_AMBULATORY_CARE_PROVIDER_SITE_OTHER)

## 2024-01-28 ENCOUNTER — Encounter: Payer: Self-pay | Admitting: Physician Assistant

## 2024-01-28 ENCOUNTER — Ambulatory Visit (INDEPENDENT_AMBULATORY_CARE_PROVIDER_SITE_OTHER): Admitting: Physician Assistant

## 2024-01-28 VITALS — BP 124/70 | HR 75 | Temp 98.1°F | Ht 60.0 in | Wt 123.2 lb

## 2024-01-28 DIAGNOSIS — R109 Unspecified abdominal pain: Secondary | ICD-10-CM

## 2024-01-28 DIAGNOSIS — R062 Wheezing: Secondary | ICD-10-CM | POA: Diagnosis not present

## 2024-01-28 DIAGNOSIS — R7401 Elevation of levels of liver transaminase levels: Secondary | ICD-10-CM

## 2024-01-28 DIAGNOSIS — R058 Other specified cough: Secondary | ICD-10-CM

## 2024-01-28 DIAGNOSIS — J441 Chronic obstructive pulmonary disease with (acute) exacerbation: Secondary | ICD-10-CM | POA: Diagnosis not present

## 2024-01-28 DIAGNOSIS — I700 Atherosclerosis of aorta: Secondary | ICD-10-CM | POA: Diagnosis not present

## 2024-01-28 DIAGNOSIS — R059 Cough, unspecified: Secondary | ICD-10-CM | POA: Diagnosis not present

## 2024-01-28 LAB — COMPREHENSIVE METABOLIC PANEL WITH GFR
ALT: 71 U/L — ABNORMAL HIGH (ref 0–35)
AST: 50 U/L — ABNORMAL HIGH (ref 0–37)
Albumin: 4 g/dL (ref 3.5–5.2)
Alkaline Phosphatase: 53 U/L (ref 39–117)
BUN: 12 mg/dL (ref 6–23)
CO2: 26 meq/L (ref 19–32)
Calcium: 9 mg/dL (ref 8.4–10.5)
Chloride: 95 meq/L — ABNORMAL LOW (ref 96–112)
Creatinine, Ser: 0.6 mg/dL (ref 0.40–1.20)
GFR: 88.19 mL/min (ref >60.00)
Glucose, Bld: 85 mg/dL (ref 70–99)
Potassium: 4.1 meq/L (ref 3.5–5.1)
Sodium: 131 meq/L — ABNORMAL LOW (ref 135–145)
Total Bilirubin: 0.4 mg/dL (ref 0.2–1.2)
Total Protein: 6.2 g/dL (ref 6.0–8.3)

## 2024-01-28 LAB — CBC WITH DIFFERENTIAL/PLATELET
Basophils Absolute: 0 10*3/uL (ref 0.0–0.1)
Basophils Relative: 0.2 % (ref 0.0–3.0)
Eosinophils Absolute: 0 10*3/uL (ref 0.0–0.7)
Eosinophils Relative: 0.4 % (ref 0.0–5.0)
HCT: 36.5 % (ref 36.0–46.0)
Hemoglobin: 11.6 g/dL — ABNORMAL LOW (ref 12.0–15.0)
Lymphocytes Relative: 11.5 % — ABNORMAL LOW (ref 12.0–46.0)
Lymphs Abs: 1 10*3/uL (ref 0.7–4.0)
MCHC: 31.8 g/dL (ref 30.0–36.0)
MCV: 90 fl (ref 78.0–100.0)
Monocytes Absolute: 1 10*3/uL (ref 0.1–1.0)
Monocytes Relative: 10.8 % (ref 3.0–12.0)
Neutro Abs: 6.9 10*3/uL (ref 1.4–7.7)
Neutrophils Relative %: 77.1 % — ABNORMAL HIGH (ref 43.0–77.0)
Platelets: 197 10*3/uL (ref 150.0–400.0)
RBC: 4.06 Mil/uL (ref 3.87–5.11)
RDW: 15.5 % (ref 11.5–15.5)
WBC: 9 10*3/uL (ref 4.0–10.5)

## 2024-01-28 LAB — BRAIN NATRIURETIC PEPTIDE: Pro B Natriuretic peptide (BNP): 50 pg/mL (ref 0.0–100.0)

## 2024-01-28 NOTE — Progress Notes (Signed)
 Patient ID: Katherine Foley, female    DOB: October 01, 1950, 73 y.o.   MRN: [id number]   Assessment & Plan:  COPD exacerbation (HCC) -     DG Chest 2 View; Future -     CBC with Differential/Platelet -     Comprehensive metabolic panel with GFR -     Brain natriuretic peptide  Productive cough -     DG Chest 2 View; Future -     CBC with Differential/Platelet -     Comprehensive metabolic panel with GFR -     Brain natriuretic peptide  Elevated transaminase level -     DG Chest 2 View; Future -     CBC with Differential/Platelet -     Comprehensive metabolic panel with GFR -     Brain natriuretic peptide  Right sided abdominal pain      Assessment and Plan Assessment & Plan COPD exacerbation COPD exacerbation with persistent symptoms including severe coughing, chest pain, and shortness of breath despite previous treatments with Augmentin , Z-Pak, and prednisone . Current treatment with nebulizer and rescue inhaler is ineffective. Concerns about potential hernia due to coughing. Considering Levaquin  as a last-line outpatient antibiotic if liver function tests are not elevated, due to its high potency and potential liver impact. - Repeat chest x-ray today - Order blood work to assess liver function tests - Consider Levaquin  if liver function tests are not elevated & if CXR indicating need - Advise hospital visit if symptoms worsen, such as increased shortness of breath or syncope - Provide work note for the rest of the week - Advise to avoid heavy lifting and use a heating pack for abdominal discomfort  Elevated liver function tests Elevated liver function tests possibly due to Augmentin  use. Recheck today.  Right sided abdominal pain No red flags on exam  Previous abdominal surgeries increase susceptibility to hernias. - Advise to avoid heavy lifting and use a heating pack for abdominal discomfort - Consider imaging if abdominal pain worsens  Discussed the  aforementioned with patient's PCP who is agreeable with plan stated.      No follow-ups on file.    Subjective:    Chief Complaint  Patient presents with   Cough    Pt in office for cough and congestion; pt states been an issue for over 4 weeks; pt using breathing treatments daily; coughing up discolored mucus; c/o RLQ pain not sure if pulled something while coughing.     HPI Discussed the use of AI scribe software for clinical note transcription with the patient, who gave verbal consent to proceed.  History of Present Illness Katherine Foley is a 73 year old female with COPD who presents with persistent cough. Here with a friend.  She has been experiencing a persistent cough, which has not improved despite multiple doctor visits: --First appt 01/06/24 - Augmentin , and Prednisone  50 mg x 5 days --Recheck 3 days later on 01/09/24 --Back again on 01/20/24 --Then most recent visit on 01/23/24 with Dr. Arliss Lam  The cough is severe enough to cause chest pain, particularly when coughing, and it has been keeping her up at night. She experiences shortness of breath primarily when coughing.  She has a history of COPD, diagnosed within the last few years, and has been hospitalized in 2022 for respiratory issues related to strep, COVID, and the flu. She uses a nebulizer once a day, although she was initially instructed to use it four times a day, but reduced the  frequency due to nausea. She has completed a course of prednisone  and a Z-Pak without relief. She had an allergic reaction to Augmentin .  She lives alone and works part-time at [employer], where she was exposed to a Immunologist. During the review of symptoms, she denies fever, chills, or other unusual symptoms. She has had multiple abdominal surgeries in the past, including appendectomy, partial hysterectomy, ovarian cyst removal. She reports pain in her RLQ abdomen that started after a coughing fit last night. No urinary or bowel  changes. She has a history of hiatal hernia.     Past Medical History:  Diagnosis Date   Adenomatous colon polyp    Angio-edema    Anxiety    Anxiety/Depression 02/15/2009   Aortic stenosis s/p bioprosthetic AVR 07/07/2010   Echo 9/19:  Mild conc LVH, EF 60-65, no RWMA, Gr 1 DD, AVR ok (mean 10, peak 21), trivial MR, normal RVSF, mild TR // Echo 10/22: EF 60-65, no RWMA, normal RVSF, AVR with increased gradients (mean 24 increased from 10 in 2019) // Echocardiogram 10/23: EF 60-65, no RWMA, mildly reduced RVSF, AVR w mean gradient 17.3 mmHg   Asthma    Barrett's esophagus    04/2007; 2000   Carotid stenosis 05/17/2010   a. dopplers 11/11: RICA 40-50%; LICA 0-39% (follow up due 11/12)   COPD 07/23/2007   Coronary Artery Disease 05/17/2010   NSTEMI 8/11 - BMS to RCA;  cath 4/12: dLAD 50%, RI 80% (PCI), AVCFX 30%, pRCA 30%, stent ok, 40-50, then 50% dist RCA;  c. s/p Promus DES to RI 12/25/10;  d. 04/2011 CABG x 1 (LIMA->LAD) @ time of AVR // Cath in 9/19: RI and RCA stents ok; L-LAD atretic, mild disease in mLAD >> med Rx   Eczema    Family history of breast cancer    2 daughters; PALB2 mutation in family   Genetic testing 02/10/2018   PALB2 analysis at [facility] - Negative for familial mutation   GERD    H/O hiatal hernia    Hemorrhoid thrombosis    "had it lanced" (03/25/2013)   Herpes simplex without mention of complication 01/16/2010   History of blood transfusion    'w/OHS" (03/25/2013)   Hyperlipidemia 08/29/2009   Hypertension 04/14/2007   Migraine HAs 04/20/2008   Myocardial infarction [facility]) 2011   "during catherization" (03/25/2013)   OSTEOPENIA 04/14/2007   Ovarian cyst    Parotid mass    left   Pneumonia    "multiple times" (03/25/2013)   Recurrent upper respiratory infection (URI)    Urine, incontinence, stress female     Past Surgical History:  Procedure Laterality Date   ADENOIDECTOMY     AORTIC VALVE REPLACEMENT  09/03/2010   APPENDECTOMY     BLEPHAROPLASTY  Bilateral ~ 1998   CARDIAC CATHETERIZATION N/A 07/06/2016   Procedure: Left Heart Cath and Cors/Grafts Angiography;  Surgeon: Arnoldo Lapping, MD;  [facility] INVASIVE CV LAB;  Service: Cardiovascular;  Laterality: N/A;   CHOLECYSTECTOMY N/A 03/25/2013   Procedure: LAPAROSCOPIC CHOLECYSTECTOMY WITH INTRAOPERATIVE CHOLANGIOGRAM;  Surgeon: Kari Otto. Eli Grizzle, MD;  Location: [facility] OR;  Service: General;  Laterality: N/A;   CORONARY ANGIOGRAPHY N/A 05/26/2018   Procedure: CORONARY ANGIOGRAPHY (CATH LAB);  Surgeon: Lucendia Rusk, MD;  Location: [facility] INVASIVE CV LAB;  Service: Cardiovascular;  Laterality: N/A;   CORONARY ANGIOPLASTY WITH STENT PLACEMENT  09/03/2008   "I have 2 stents" (03/25/2013)   NASAL SINUS SURGERY  1997; 1998   "cust in maxillary sinus; put  a stent in then removed it" (03/25/2013)   ORIF ANKLE FRACTURE Right 02/14/2021   Procedure: OPEN TREATMENT OF RIGHT TRIMALLEOLAR ANKLE FRACTURE WITHOUT POSTERIOR FIXATION, SYNDESMOSIS;  Surgeon: Donnamarie Gables, MD;  Location: [facility];  Service: Orthopedics;  Laterality: Right;   OVARIAN CYST REMOVAL Right 09/03/2009   "size of a soccer ball" (03/25/2013)   PAROTIDECTOMY Left 04/28/2019   Procedure: LEFT TOTAL PAROTIDECTOMY;  Surgeon: Reynold Caves, MD;  Location: [facility];  Service: ENT;  Laterality: Left;   RIGHT HEART CATH AND CORONARY ANGIOGRAPHY N/A 07/23/2022   Procedure: RIGHT HEART CATH AND CORONARY ANGIOGRAPHY;  Surgeon: Odie Benne, MD;  Location: [facility] INVASIVE CV LAB;  Service: Cardiovascular;  Laterality: N/A;   thrombosed hemorrohid lanced     TONSILLECTOMY AND ADENOIDECTOMY     TOTAL ABDOMINAL HYSTERECTOMY      Family History  Problem Relation Age of Onset   Heart disease Mother    COPD Father        smoked   Heart disease Father    Rheum arthritis Father    Allergic rhinitis Sister    Cirrhosis Sister    Stroke Brother    Heart disease Maternal Uncle    Breast cancer  Daughter        currently 67   Breast cancer Daughter 15       currently 10; PALB2 mutation   Asthma Son        as a child    Coronary artery disease Other    Heart disease Other        6 stents   Cancer Other        very distant paternal cousin; pancreatic ca   Colon cancer Neg Hx    Colon polyps Neg Hx     Social History   Tobacco Use   Smoking status: Former    Current packs/day: 0.00    Average packs/day: 1.5 packs/day for 44.0 years (66.0 ttl pk-yrs)    Types: Cigarettes    Start date: 09/03/1961    Quit date: 09/03/2005    Years since quitting: 18.4   Smokeless tobacco: Never  Vaping Use   Vaping status: Never Used  Substance Use Topics   Alcohol use: Not Currently    Alcohol/week: 0.0 standard drinks of alcohol    Comment: social   Drug use: No     Allergies  Allergen Reactions   Codeine Sulfate Itching and Rash   Hydrocodone-Acetaminophen  Itching and Rash   Augmentin  [Amoxicillin -Pot Clavulanate] Rash    Review of Systems NEGATIVE UNLESS OTHERWISE INDICATED IN HPI      Objective:     BP 124/70 (BP Location: Left Arm, Patient Position: Sitting)   Pulse 75   Temp 98.1 F (36.7 C) (Temporal)   Ht 5' (1.524 m)   Wt 123 lb 3.2 oz (55.9 kg)   SpO2 96%   BMI 24.06 kg/m   Wt Readings from Last 3 Encounters:  01/28/24 123 lb 3.2 oz (55.9 kg)  01/23/24 125 lb 6.4 oz (56.9 kg)  01/20/24 122 lb 9.6 oz (55.6 kg)    BP Readings from Last 3 Encounters:  01/28/24 124/70  01/23/24 132/68  01/20/24 120/72     Physical Exam Vitals and nursing note reviewed.  Constitutional:      Appearance: Normal appearance.  Eyes:     Conjunctiva/sclera: Conjunctivae normal.     Pupils: Pupils are equal, round, and reactive to light.  Cardiovascular:  Rate and Rhythm: Normal rate and regular rhythm.  Pulmonary:     Effort: No respiratory distress.     Breath sounds: Decreased breath sounds, wheezing, rhonchi and rales present.     Comments: Persistent coughing  fits Musculoskeletal:     Right lower leg: No edema.     Left lower leg: No edema.  Neurological:     General: No focal deficit present.     Mental Status: She is alert and oriented to person, place, and time.  Psychiatric:        Mood and Affect: Mood normal.             Tyran Huser M Robie Oats, PA-C

## 2024-01-29 ENCOUNTER — Ambulatory Visit: Payer: Self-pay | Admitting: Physician Assistant

## 2024-02-03 ENCOUNTER — Encounter: Payer: Self-pay | Admitting: Family Medicine

## 2024-02-03 NOTE — Telephone Encounter (Signed)
 Ov needed to assess?

## 2024-02-04 ENCOUNTER — Ambulatory Visit: Payer: Self-pay | Admitting: Family Medicine

## 2024-02-04 ENCOUNTER — Other Ambulatory Visit (INDEPENDENT_AMBULATORY_CARE_PROVIDER_SITE_OTHER)

## 2024-02-04 DIAGNOSIS — R7401 Elevation of levels of liver transaminase levels: Secondary | ICD-10-CM

## 2024-02-04 LAB — COMPREHENSIVE METABOLIC PANEL WITH GFR
ALT: 48 U/L — ABNORMAL HIGH (ref 0–35)
AST: 31 U/L (ref 0–37)
Albumin: 3.8 g/dL (ref 3.5–5.2)
Alkaline Phosphatase: 51 U/L (ref 39–117)
BUN: 7 mg/dL (ref 6–23)
CO2: 27 meq/L (ref 19–32)
Calcium: 8.8 mg/dL (ref 8.4–10.5)
Chloride: 98 meq/L (ref 96–112)
Creatinine, Ser: 0.61 mg/dL (ref 0.40–1.20)
GFR: 87.83 mL/min (ref >60.00)
Glucose, Bld: 102 mg/dL — ABNORMAL HIGH (ref 70–99)
Potassium: 4 meq/L (ref 3.5–5.1)
Sodium: 132 meq/L — ABNORMAL LOW (ref 135–145)
Total Bilirubin: 0.5 mg/dL (ref 0.2–1.2)
Total Protein: 5.9 g/dL — ABNORMAL LOW (ref 6.0–8.3)

## 2024-02-04 NOTE — Telephone Encounter (Signed)
 Called and spoke to pt about how she's doing and if appt was needed. Pt stated had blood work done and would like to wait til appt on 7/15 to come in and discuss.

## 2024-02-18 DIAGNOSIS — F4381 Prolonged grief disorder: Secondary | ICD-10-CM | POA: Diagnosis not present

## 2024-02-24 ENCOUNTER — Other Ambulatory Visit: Payer: Self-pay | Admitting: Family Medicine

## 2024-03-17 ENCOUNTER — Ambulatory Visit: Payer: Medicare HMO | Admitting: Family Medicine

## 2024-03-17 VITALS — BP 130/60 | HR 66 | Temp 97.9°F | Ht 60.0 in | Wt 121.4 lb

## 2024-03-17 DIAGNOSIS — I10 Essential (primary) hypertension: Secondary | ICD-10-CM

## 2024-03-17 DIAGNOSIS — Z1231 Encounter for screening mammogram for malignant neoplasm of breast: Secondary | ICD-10-CM

## 2024-03-17 DIAGNOSIS — Z23 Encounter for immunization: Secondary | ICD-10-CM | POA: Diagnosis not present

## 2024-03-17 DIAGNOSIS — E119 Type 2 diabetes mellitus without complications: Secondary | ICD-10-CM | POA: Diagnosis not present

## 2024-03-17 DIAGNOSIS — R310 Gross hematuria: Secondary | ICD-10-CM | POA: Diagnosis not present

## 2024-03-17 DIAGNOSIS — E785 Hyperlipidemia, unspecified: Secondary | ICD-10-CM

## 2024-03-17 DIAGNOSIS — Z0000 Encounter for general adult medical examination without abnormal findings: Secondary | ICD-10-CM

## 2024-03-17 NOTE — Patient Instructions (Addendum)
 Prevnar 20 today- final pneumonia shot per current guidelines  Next eye exam let them know you have new diagnosis of diabetes and have them send me a copy  [facility] [place] Schedule an appointment by calling [phone].  We have placed a referral for you today to [facility]- please call their # if you do not hear within a week (may be listed below or you may see mychart message within a few days with #).   Please stop by lab before you go If you have mychart- we will send your results within 3 business days of us  receiving them.  If you do not have mychart- we will call you about results within 5 business days of us  receiving them.  *please also note that you will see labs on mychart as soon as they post. I will later go in and write notes on them- will say notes from Dr. Katrinka   Recommended follow up: Return in about 3 months (around 06/17/2024) for followup or sooner if needed.Schedule b4 you leave. For Diabetes

## 2024-03-17 NOTE — Progress Notes (Signed)
 Phone [phone]   Subjective:  Patient presents today for their annual physical. Chief complaint-noted.   See problem oriented charting- ROS- full  review of systems was completed and negative Per full ROS sheet completed by patient except for topics noted under acute/chronic concerns  The following were reviewed and entered/updated in epic: Past Medical History:  Diagnosis Date   Adenomatous colon polyp    Angio-edema    Anxiety    Anxiety/Depression 02/15/2009   Aortic stenosis s/p bioprosthetic AVR 07/07/2010   Echo 9/19:  Mild conc LVH, EF 60-65, no RWMA, Gr 1 DD, AVR ok (mean 10, peak 21), trivial MR, normal RVSF, mild TR // Echo 10/22: EF 60-65, no RWMA, normal RVSF, AVR with increased gradients (mean 24 increased from 10 in 2019) // Echocardiogram 10/23: EF 60-65, no RWMA, mildly reduced RVSF, AVR w mean gradient 17.3 mmHg   Asthma    Barrett's esophagus    04/2007; 2000   Carotid stenosis 05/17/2010   a. dopplers 11/11: RICA 40-50%; LICA 0-39% (follow up due 11/12)   COPD 07/23/2007   Coronary Artery Disease 05/17/2010   NSTEMI 8/11 - BMS to RCA;  cath 4/12: dLAD 50%, RI 80% (PCI), AVCFX 30%, pRCA 30%, stent ok, 40-50, then 50% dist RCA;  c. s/p Promus DES to RI 12/25/10;  d. 04/2011 CABG x 1 (LIMA->LAD) @ time of AVR // Cath in 9/19: RI and RCA stents ok; L-LAD atretic, mild disease in mLAD >> med Rx   Eczema    Family history of breast cancer    2 daughters; PALB2 mutation in family   Genetic testing 02/10/2018   PALB2 analysis at [facility] - Negative for familial mutation   GERD    H/O hiatal hernia    Hemorrhoid thrombosis    had it lanced (03/25/2013)   Herpes simplex without mention of complication 01/16/2010   History of blood transfusion    'w/OHS (03/25/2013)   Hyperlipidemia 08/29/2009   Hypertension 04/14/2007   Migraine HAs 04/20/2008   Myocardial infarction (HCC) 2011   during catherization (03/25/2013)   OSTEOPENIA 04/14/2007   Ovarian cyst     Parotid mass    left   Pneumonia    multiple times (03/25/2013)   Recurrent upper respiratory infection (URI)    Urine, incontinence, stress Foley    Patient Active Problem List   Diagnosis Date Noted   Diabetes mellitus without complication (HCC) 01/09/2024    Priority: High   H/O parotidectomy 04/28/2019    Priority: High   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    Priority: High   Depression 09/22/2015    Priority: High   Former smoker 09/22/2015    Priority: High   S/P AVR (aortic valve replacement) 11/06/2013    Priority: High   Aortic stenosis 07/07/2010    Priority: High   Coronary artery disease involving native coronary artery with angina pectoris (HCC) 05/17/2010    Priority: High   COPD (chronic obstructive pulmonary disease) (HCC) 07/23/2007    Priority: High   Aortic atherosclerosis (HCC) 03/10/2020    Priority: Medium    Restless legs 08/25/2019    Priority: Medium    Hot flashes 03/22/2016    Priority: Medium    Diastolic dysfunction 05/31/2015    Priority: Medium    GERD (gastroesophageal reflux disease) 06/04/2014    Priority: Medium    Carotid stenosis 05/17/2010    Priority: Medium    HLD (hyperlipidemia) 08/29/2009    Priority: Medium  Essential hypertension 04/14/2007    Priority: Medium    Osteopenia 04/14/2007    Priority: Medium    Genetic testing 02/10/2018    Priority: Low   Family history of breast cancer     Priority: Low   Allergic rhinitis 09/22/2015    Priority: Low   Adenomatous colon polyp     Priority: Low   Herpes simplex virus (HSV) infection 01/16/2010    Priority: Low   Deviated nasal septum 09/22/2023   Hypertrophy of nasal turbinates 09/22/2023   Postnasal drip 09/22/2023   Recurrent major depressive disorder, in partial remission (HCC) 09/06/2022   Influenza A 08/28/2022   COVID-19 07/31/2022   Hypokalemia 07/31/2022   Anemia 07/31/2022   Coronary artery disease involving native coronary artery of native heart  without angina pectoris 07/23/2022   Precordial chest pain 11/06/2013   Past Surgical History:  Procedure Laterality Date   ADENOIDECTOMY     AORTIC VALVE REPLACEMENT  09/03/2010   APPENDECTOMY     BLEPHAROPLASTY Bilateral ~ 1998   CARDIAC CATHETERIZATION N/A 07/06/2016   Procedure: Left Heart Cath and Cors/Grafts Angiography;  Surgeon: Ozell Fell, MD;  Location[facility] INVASIVE CV LAB;  Service: Cardiovascular;  Laterality: N/A;   CHOLECYSTECTOMY N/A 03/25/2013   Procedure: LAPAROSCOPIC CHOLECYSTECTOMY WITH INTRAOPERATIVE CHOLANGIOGRAM;  Surgeon: Donnice POUR. Belinda, MD;  Location: [facility] OR;  Service: General;  Laterality: N/A;   CORONARY ANGIOGRAPHY N/A 05/26/2018   Procedure: CORONARY ANGIOGRAPHY (CATH LAB);  Surgeon: Dann Candyce RAMAN, MD;  Location: [facility] INVASIVE CV LAB;  Service: Cardiovascular;  Laterality: N/A;   CORONARY ANGIOPLASTY WITH STENT PLACEMENT  09/03/2008   I have 2 stents (03/25/2013)   NASAL SINUS SURGERY  1997; 1998   cust in maxillary sinus; put a stent in then removed it (03/25/2013)   ORIF ANKLE FRACTURE Right 02/14/2021   Procedure: OPEN TREATMENT OF RIGHT TRIMALLEOLAR ANKLE FRACTURE WITHOUT POSTERIOR FIXATION, SYNDESMOSIS;  Surgeon: Elsa Lonni SAUNDERS, MD;  Location: [facility];  Service: Orthopedics;  Laterality: Right;   OVARIAN CYST REMOVAL Right 09/03/2009   size of a soccer ball (03/25/2013)   PAROTIDECTOMY Left 04/28/2019   Procedure: LEFT TOTAL PAROTIDECTOMY;  Surgeon: Karis Clunes, MD;  Location: [facility];  Service: ENT;  Laterality: Left;   RIGHT HEART CATH AND CORONARY ANGIOGRAPHY N/A 07/23/2022   Procedure: RIGHT HEART CATH AND CORONARY ANGIOGRAPHY;  Surgeon: Verlin Lonni BIRCH, MD;  Location: [facility] INVASIVE CV LAB;  Service: Cardiovascular;  Laterality: N/A;   thrombosed hemorrohid lanced     TONSILLECTOMY AND ADENOIDECTOMY     TOTAL ABDOMINAL HYSTERECTOMY      Family History  Problem Relation Age of Onset   Heart  disease Mother    COPD Father        smoked   Heart disease Father    Rheum arthritis Father    Allergic rhinitis Sister    Cirrhosis Sister    Stroke Brother    Heart disease Maternal Uncle    Breast cancer Daughter        currently 56   Breast cancer Daughter 19       currently 57; PALB2 mutation   Asthma Son        as a child    Coronary artery disease Other    Heart disease Other        6 stents   Cancer Other        very distant paternal cousin; pancreatic ca   Colon cancer Neg  Hx    Colon polyps Neg Hx     Medications- reviewed and updated Current Outpatient Medications  Medication Sig Dispense Refill   acetaminophen  (TYLENOL ) 650 MG CR tablet Take 1,300 mg by mouth every 8 (eight) hours as needed for pain.     albuterol  (VENTOLIN  HFA) 108 (90 Base) MCG/ACT inhaler Inhale 2 puffs into the lungs every 6 (six) hours as needed for wheezing or shortness of breath. 8 g 3   amLODipine  (NORVASC ) 2.5 MG tablet TAKE 1 TABLET BY MOUTH ONCE DAILY 90 tablet 3   aspirin  EC 81 MG tablet Take 1 tablet (81 mg total) by mouth daily.     Budeson-Glycopyrrol-Formoterol  (BREZTRI  AEROSPHERE) 160-9-4.8 MCG/ACT AERO Inhale 2 puffs into the lungs in the morning and at bedtime. 1 each 11   buPROPion  (WELLBUTRIN  XL) 300 MG 24 hr tablet TAKE 1 TABLET BY MOUTH ONCE DAILY 30 tablet 10   Calcium  Carb-Cholecalciferol (CALCIUM  500/D PO) Take 2 tablets by mouth daily. Mag and zinc added     desvenlafaxine  (PRISTIQ ) 100 MG 24 hr tablet TAKE 1 TABLET BY MOUTH EVERY MORNING 30 tablet 10   dexlansoprazole  (DEXILANT ) 60 MG capsule Take 1 capsule (60 mg total) by mouth daily. 90 capsule 3   ezetimibe  (ZETIA ) 10 MG tablet TAKE 1 TABLET BY MOUTH ONCE DAILY 90 tablet 1   fluticasone  (CUTIVATE ) 0.05 % cream Apply 1 Application topically 2 (two) times daily as needed (eczema).     fluticasone  (FLONASE ) 50 MCG/ACT nasal spray Place 2 sprays into both nostrils as needed for allergies or rhinitis. 16 g 3   gabapentin   (NEURONTIN ) 300 MG capsule TAKE 1 CAPSULE BY MOUTH EVERY DAY AT BEDTIME 30 capsule 10   ipratropium (ATROVENT ) 0.06 % nasal spray Place 2 sprays into both nostrils 2 (two) times daily as needed for rhinitis (drainage). 15 mL 10   ipratropium-albuterol  (DUONEB) 0.5-2.5 (3) MG/3ML SOLN Take 3 mLs by nebulization every 6 (six) hours as needed. 360 mL 4   isosorbide  mononitrate (IMDUR ) 30 MG 24 hr tablet TAKE 1/2 TABLET BY MOUTH ONCE DAILY 45 tablet 3   metoCLOPramide  (REGLAN ) 5 MG tablet Take 1 tablet (5 mg total) by mouth once for 1 dose. 2 tablet 0   Multiple Vitamins-Minerals (MULTIVITAMIN ADULT PO) Take 1 tablet by mouth daily.     nitroGLYCERIN  (NITROSTAT ) 0.4 MG SL tablet Place 1 tablet (0.4 mg total) under the tongue every 5 (five) minutes as needed for chest pain (if pain does not resolve with 2 doses, call 911). 30 tablet 5   rosuvastatin  (CRESTOR ) 40 MG tablet TAKE 1 TABLET BY MOUTH ONCE DAILY 30 tablet 10   triamcinolone  (NASACORT ) 55 MCG/ACT AERO nasal inhaler Place 1 spray into the nose daily. Start with 1 spray each side twice a day for 3 days, then reduce to daily. 1 each 2   valACYclovir  (VALTREX ) 1000 MG tablet TAKE 2 TABLETS BY MOUTH TWICE DAILY FOR 1 DAY AT FIRST SIGN OF COLD SORE *REFILL REQUEST* 30 tablet 10   azelastine  (ASTELIN ) 0.1 % nasal spray Place 2 sprays into both nostrils 2 (two) times daily. Use in each nostril as directed 30 mL 12   carboxymethylcellulose (REFRESH PLUS) 0.5 % SOLN Place 1 drop into both eyes 3 (three) times daily as needed.     fexofenadine  (ALLEGRA ) 180 MG tablet Take 1 tablet (180 mg total) by mouth daily. For itching. (Patient not taking: Reported on 01/28/2024) 10 tablet 0   LORazepam  (ATIVAN ) 0.5 MG tablet  TAKE 1 TABLET BY MOUTH TWICE DAILY AS NEEDED FOR ANXIETY *DO NOT DRIVE FOR 8 HOURS AFTER USING* *MAY FILL MONTHLY, NEXT DELIVERY 09/27/23* (Patient not taking: Reported on 03/17/2024) 60 tablet 5   No current facility-administered medications for this  visit.    Allergies-reviewed and updated Allergies  Allergen Reactions   Codeine Sulfate Itching and Rash   Hydrocodone-Acetaminophen  Itching and Rash   Augmentin  [Amoxicillin -Pot Clavulanate] Rash    Social History   Social History Narrative   Lost fiancee of 22 years in 2022. 3 children (2 living). 6 grandkids. 2 greatgrandkids. Keeps greatgrandson on tuesdays- loves her so much.       Retired from [employer] (parttime- Dec 12)   Faith is very important to her      Hobbies: reading, cross stich, sew. Wants to learn to knit.       Objective  Objective:  BP 130/60   Pulse 66   Temp 97.9 F (36.6 C)   Ht 5' (1.524 m)   Wt 121 lb 6.4 oz (55.1 kg)   SpO2 95%   BMI 23.71 kg/m  Gen: NAD, resting comfortably HEENT: Mucous membranes are moist. Oropharynx normal Neck: no thyromegaly CV: RRR  stable murmur after valve replacement- no  rubs or gallops Lungs: CTAB no crackles, wheeze, rhonchi Abdomen: soft/nontender/nondistended/normal bowel sounds. No rebound or guarding. Diastasis recti noted Ext: no edema Skin: warm, dry Neuro: grossly normal, moves all extremities, PERRLA  Diabetic foot exam was performed with the following findings:   No deformities, ulcerations, or other skin breakdown Normal sensation of 10g monofilament Intact posterior tibialis and dorsalis pedis pulses       Assessment and Plan   73 y.o. Foley presenting for annual physical.  Health Maintenance counseling: 1. Anticipatory guidance: Patient counseled regarding regular dental exams - wears dentures, eye exams - yearly at least,  avoiding smoking and second hand smoke , limiting alcohol to 1 beverage per day= doesn't drink , no illicit drugs .   2. Risk factor reduction:  Advised patient of need for regular exercise and diet rich and fruits and vegetables to reduce risk of heart attack and stroke.  Exercise- walking dog daily twice daily.  Diet/weight management-slight weight loss with recent  illness- encouraged stability.  Wt Readings from Last 3 Encounters:  03/17/24 121 lb 6.4 oz (55.1 kg)  01/28/24 123 lb 3.2 oz (55.9 kg)  01/23/24 125 lb 6.4 oz (56.9 kg)   3. Immunizations/screenings/ancillary studies- Prevnar 20 today.  Otherwise up to date  Immunization History  Administered Date(s) Administered   Fluad Quad(high Dose 65+) 07/14/2020   Influenza Split 06/19/2011, 07/02/2012   Influenza Whole 08/03/2009   Influenza, High Dose Seasonal PF 05/20/2015, 06/04/2019, 06/04/2019   Influenza,inj,Quad PF,6+ Mos 05/21/2013, 05/14/2014, 06/17/2018   Influenza-Unspecified 08/17/2016, 06/19/2017, 06/17/2018   PFIZER(Purple Top)SARS-COV-2 Vaccination 10/20/2019, 11/09/2019, 06/30/2020   Pneumococcal Conjugate-13 06/14/2014   Pneumococcal Polysaccharide-23 03/22/2016   Td 08/29/2009, 03/17/2020   Zoster Recombinant(Shingrix) 03/10/2018, 07/15/2018   Zoster, Live 01/21/2016  4. Cervical cancer screening- past age based screening recommendations plus total hysterectomy 5. Breast cancer screening-  declines mammogram- reports issues with positioning - but encouraged and agrees to try 6. Colon cancer screening - colonoscopy earlier this year x 2 for polyp removal- prior blood in stool- was told graduated  7. Skin cancer screening- dermatology in past- no recent issues. advised regular sunscreen use. Denies worrisome, changing, or new skin lesions.  8. Birth control/STD check- postmenopausal and not sexually active- does  not plan to date 9. Osteoporosis screening at 36- 03/09/20 - in process of moving and prefers to do next physical 10. Smoking associated screening - former smoker- quit 2007- past lung cancer screening guidelines and refer to urology given gross hematuria  Status of chronic or acute concerns    # gross hematuria S:last Wednesday noted blood in the urine and when wiped noted some stringy blood . Noted pain before it happened. Drank cranberry juice after ahd it helped. Hurt  again when she urinated a day later- could have been kidney or bladder stones- no flank pain A/P: will check urinalysis and culture- wonder about stones- refer to urology with smoking history as increases bladder cancer risk  # New diabetes by May 2025 S:  Medication: none- prednisone  may have contributed  Lab Results  Component Value Date   HGBA1C 6.5 01/06/2024   HGBA1C 6.5 05/28/2023   HGBA1C 6.2 (H) 12/14/2022     A/P: new diagnosis- continue to monitor - too soon for a1c- stay off of medications for now  #CAD-Status post prior PCI to the RCA and RI and subsequent CABG in 2012.  Cardiac catheterization in 2019 demonstrated patent stents in the RCA and RI, atretic LIMA-LAD and mild nonobstructive disease in the LAD.  -Also had cath on 07/23/2022-plan was medical management #hyperlipidemia with aortic atherosclerosis S: Medication:Aspirin  81 mg, Zetia  10 mg, rosuvastatin  40 mg, Imdur  30 mg -stable shortness of breath, no status post  Lab Results  Component Value Date   CHOL 112 03/05/2022   HDL 51.20 03/05/2022   LDLCALC 44 03/05/2022   LDLDIRECT 39 07/01/2023   TRIG Katherine.0 03/05/2022   CHOLHDL 2 03/05/2022  A/P: CAD-- largely asymptomatic other than lung issues- continue current medications  Hyperlipidemia-at goal with LDL under 70 in past- update today Aortic atherosclerosis (presumed stable)- LDL goal ideally <70 - update lipids and continue current medications for now   -some muscle cramping- pickle juice helps- history of tendency to dehydration. Mustard helps some too- continue to monitor   % Aortic valve disease status post aortic valve replacement-follows with cardiology including echocardiograms and has SBE prophylaxis- last echocardiogram December 2024    #hypertension S: medication: Amlodipine  2.5 mg, Imdur  30 mg BP Readings from Last 3 Encounters:  03/17/24 130/60  01/28/24 124/70  01/23/24 132/68  A/P: well controlled continue current medications   # Depression #  Anxiety S:Medication: Pristiq  100 mg, Wellbutrin  300 mg extended release, lorazepam  0.5 mg twice daily as needed   -Contributing factors-also fianc of 22 years at age 49 in 2020 and previously lost her best friend/daughter Angie in 2019    01/06/2024    3:23 PM 09/20/2023    3:19 PM 09/02/2023   11:32 AM  Depression screen PHQ 2/9  Decreased Interest 0 1 3  Down, Depressed, Hopeless 0 1 3  PHQ - 2 Score 0 2 6  Altered sleeping 0 3 3  Tired, decreased energy 0 3 3  Change in appetite 0 2 3  Feeling bad or failure about yourself  0 2 2  Trouble concentrating 0 2 2  Moving slowly or fidgety/restless 0 1 1  Suicidal thoughts 0 1 1  PHQ-9 Score 0 16 21  Difficult doing work/chores Not difficult at all Not difficult at all     A/P: full remissoin- continue current medications    #COPD/former smoker-saw Dr. Alaine- upcoming visit with Dr. Darlean S: Medication:Breztri . Ongoing cough despite prior treatments. Still having some cough and sputum but  much better than before. 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 Future Appointments  Date Time Provider Department Center  04/02/2024  1:50 PM Karis Clunes, MD [facility] None  04/09/2024 11:30 AM Darlean Ozell NOVAK, MD [facility] None  06/09/2024 10:20 AM Wonda Ozell, MD [facility] H&V  06/23/2024 11:20 AM [facility] ANNUAL WELLNESS VISIT 1 [facility]    Lab/Order associations: fasting   ICD-10-CM   1. Preventative health care  Z00.00     2. Gross hematuria  R31.0 Urine Culture    Urinalysis,  Routine w reflex microscopic    Ambulatory referral to Urology    3. Diabetes mellitus without complication (HCC)  E11.9     4. Essential hypertension  I10     5. Hyperlipidemia, unspecified hyperlipidemia type  E78.5 Comprehensive metabolic panel with GFR    CBC with Differential/Platelet    Lipid panel    6. Encounter for screening mammogram for malignant neoplasm of breast  Z12.31 MM 3D SCREENING MAMMOGRAM BILATERAL BREAST      No orders of the defined types were placed in this encounter.   Return precautions advised.  Garnette Lukes, MD

## 2024-03-17 NOTE — Addendum Note (Signed)
 Addended by: KALLIE RAEANNE DEL on: 03/17/2024 04:11 PM   Modules accepted: Orders

## 2024-03-18 ENCOUNTER — Ambulatory Visit: Payer: Self-pay | Admitting: Family Medicine

## 2024-03-18 LAB — COMPREHENSIVE METABOLIC PANEL WITH GFR
ALT: 19 U/L (ref 0–35)
AST: 26 U/L (ref 0–37)
Albumin: 4.1 g/dL (ref 3.5–5.2)
Alkaline Phosphatase: 53 U/L (ref 39–117)
BUN: 11 mg/dL (ref 6–23)
CO2: 30 meq/L (ref 19–32)
Calcium: 9 mg/dL (ref 8.4–10.5)
Chloride: 97 meq/L (ref 96–112)
Creatinine, Ser: 0.64 mg/dL (ref 0.40–1.20)
GFR: 86.74 mL/min (ref >60.00)
Glucose, Bld: 92 mg/dL (ref 70–99)
Potassium: 4.3 meq/L (ref 3.5–5.1)
Sodium: 135 meq/L (ref 135–145)
Total Bilirubin: 0.4 mg/dL (ref 0.2–1.2)
Total Protein: 5.9 g/dL — ABNORMAL LOW (ref 6.0–8.3)

## 2024-03-18 LAB — LIPID PANEL
Cholesterol: 121 mg/dL (ref 0–200)
HDL: 67.2 mg/dL (ref >39.00)
LDL Cholesterol: 44 mg/dL (ref 0–99)
NonHDL: 53.97
Total CHOL/HDL Ratio: 2
Triglycerides: 52 mg/dL (ref 0.0–149.0)
VLDL: 10.4 mg/dL (ref 0.0–40.0)

## 2024-03-18 LAB — URINE CULTURE
MICRO NUMBER:: 16700891
Result:: NO GROWTH
SPECIMEN QUALITY:: ADEQUATE

## 2024-03-18 LAB — URINALYSIS, ROUTINE W REFLEX MICROSCOPIC
Bilirubin Urine: NEGATIVE
Hgb urine dipstick: NEGATIVE
Ketones, ur: NEGATIVE
Leukocytes,Ua: NEGATIVE
Nitrite: NEGATIVE
Specific Gravity, Urine: <=1.005 — AB (ref 1.000–1.030)
Total Protein, Urine: NEGATIVE
Urine Glucose: NEGATIVE
Urobilinogen, UA: 0.2 (ref 0.0–1.0)
pH: 7 (ref 5.0–8.0)

## 2024-03-18 LAB — CBC WITH DIFFERENTIAL/PLATELET
Basophils Absolute: 0 K/uL (ref 0.0–0.1)
Basophils Relative: 0.8 % (ref 0.0–3.0)
Eosinophils Absolute: 0 K/uL (ref 0.0–0.7)
Eosinophils Relative: 0.7 % (ref 0.0–5.0)
HCT: 36.9 % (ref 36.0–46.0)
Hemoglobin: 11.8 g/dL — ABNORMAL LOW (ref 12.0–15.0)
Lymphocytes Relative: 12 % (ref 12.0–46.0)
Lymphs Abs: 0.7 K/uL (ref 0.7–4.0)
MCHC: 32 g/dL (ref 30.0–36.0)
MCV: 90.3 fl (ref 78.0–100.0)
Monocytes Absolute: 0.5 K/uL (ref 0.1–1.0)
Monocytes Relative: 9.2 % (ref 3.0–12.0)
Neutro Abs: 4.2 K/uL (ref 1.4–7.7)
Neutrophils Relative %: 77.3 % — ABNORMAL HIGH (ref 43.0–77.0)
Platelets: 161 K/uL (ref 150.0–400.0)
RBC: 4.09 Mil/uL (ref 3.87–5.11)
RDW: 15.5 % (ref 11.5–15.5)
WBC: 5.4 K/uL (ref 4.0–10.5)

## 2024-03-19 DIAGNOSIS — F4381 Prolonged grief disorder: Secondary | ICD-10-CM | POA: Diagnosis not present

## 2024-03-20 ENCOUNTER — Ambulatory Visit (INDEPENDENT_AMBULATORY_CARE_PROVIDER_SITE_OTHER): Payer: Medicare HMO | Admitting: Otolaryngology

## 2024-04-02 ENCOUNTER — Encounter (INDEPENDENT_AMBULATORY_CARE_PROVIDER_SITE_OTHER): Payer: Self-pay | Admitting: Otolaryngology

## 2024-04-02 ENCOUNTER — Ambulatory Visit (INDEPENDENT_AMBULATORY_CARE_PROVIDER_SITE_OTHER): Admitting: Otolaryngology

## 2024-04-02 VITALS — BP 135/65 | HR 68

## 2024-04-02 DIAGNOSIS — J342 Deviated nasal septum: Secondary | ICD-10-CM

## 2024-04-02 DIAGNOSIS — R0982 Postnasal drip: Secondary | ICD-10-CM

## 2024-04-02 DIAGNOSIS — J343 Hypertrophy of nasal turbinates: Secondary | ICD-10-CM | POA: Diagnosis not present

## 2024-04-02 DIAGNOSIS — J0101 Acute recurrent maxillary sinusitis: Secondary | ICD-10-CM

## 2024-04-02 DIAGNOSIS — J310 Chronic rhinitis: Secondary | ICD-10-CM

## 2024-04-02 MED ORDER — LEVOFLOXACIN 500 MG PO TABS
500.0000 mg | ORAL_TABLET | Freq: Every day | ORAL | 0 refills | Status: AC
Start: 2024-04-02 — End: 2024-04-09

## 2024-04-02 MED ORDER — PREDNISONE 10 MG (21) PO TBPK
ORAL_TABLET | ORAL | 0 refills | Status: DC
Start: 2024-04-02 — End: 2024-06-09

## 2024-04-04 DIAGNOSIS — J0101 Acute recurrent maxillary sinusitis: Secondary | ICD-10-CM | POA: Insufficient documentation

## 2024-04-04 NOTE — Progress Notes (Signed)
 Patient ID: Katherine Foley, female   DOB: 05-Jan-1951, 73 y.o.   MRN: [id number]  Follow-up: Chronic nasal congestion, postnasal drainage New complaints: Acute sinusitis, facial pain  HPI: The patient is a 73 year old female who presents today with a new complaint of an acute sinusitis.  The patient was previously seen for chronic nasal congestion and chronic nasal drainage.  At her last visit in January 2025, she was noted to have nasal mucosal congestion, nasal septal deviation, and bilateral inferior turbinate hypertrophy.  She was treated with Atrovent  and azelastine  nasal sprays.  The patient presents today complaining of new onset facial pain and pressure for 1 day.  She had an episode of sinusitis in May 2025.  She was successfully treated with antibiotic.  She is still experiencing intermittent nasal drainage.  She denies any fever or visual change.  Exam: General: Communicates without difficulty, well nourished, no acute distress. Head: Normocephalic, no evidence injury, no tenderness, facial buttresses intact without stepoff. Face/sinus: No tenderness to palpation and percussion. Facial movement is normal and symmetric. Eyes: PERRL, EOMI. No scleral icterus, conjunctivae clear. Neuro: CN II exam reveals vision grossly intact.  No nystagmus at any point of gaze. Ears: Auricles well formed without lesions.  Ear canals are intact without mass or lesion.  No erythema or edema is appreciated.  The TMs are intact without fluid. Nose: External evaluation reveals normal support and skin without lesions.  Dorsum is intact.  Anterior rhinoscopy reveals edematous and erythematous mucosa over anterior aspect of inferior turbinates and deviated septum. Oral:  Oral cavity and oropharynx are intact, symmetric, without erythema or edema.  Mucosa is moist without lesions. Neck: Full range of motion without pain.  There is no significant lymphadenopathy.  No masses palpable.  Thyroid  bed within normal limits to  palpation.  Parotid glands and submandibular glands equal bilaterally without mass.  Trachea is midline. Neuro:  CN 2-12 grossly intact.   Assessment: 1.  Acute rhinosinusitis, with erythematous and edematous nasal mucosa. 2.  Nasal septal deviation and bilateral inferior turbinate hypertrophy. 3.  Chronic postnasal drainage.  Plan: 1.  The physical exam findings are reviewed with the patient. 2.  Continue with Atrovent  and azelastine  nasal sprays. 3.  Levofloxacin  daily for 7 days. 4.  Prednisone  Dosepak for 6 days.  The possible side effects are reviewed with the patient. 5.  The patient will return for reevaluation if she continues to be symptomatic.  Otherwise she will return in 6 months.

## 2024-04-05 NOTE — Progress Notes (Unsigned)
 Subjective:    Patient ID: Katherine Foley, female    DOB: 09-04-1950,    MRN: [id number]   Brief patient profile:  65 yowf quit smoking 2007 @ dx pneumonia L lung admit cone >  More doe p discharge and required prn saba thereafter and then AVR/cabg 2012  With preop spirometry no obst but  downhill since then with dx of copd so referred to pulmonary clinic 01/25/2015 by Katherine Foley    01/25/2015 1st Katherine Foley Pulmonary office visit/ Katherine Foley  longterm corgard  rx originally per Katherine Mavis  Chief Complaint  Patient presents with   Pulmonary Consult    Referred by Katherine Foley. Pt states that she was dxed with Emphysema in 2007. She c/o SOB with doing anything strenuous.  She is using albuterol  no more than once per wk.   doe x more than walk flat surfaces nl to sltly slower pace than others  Can't do incline on treadmill/ heat makes it worse Lots of sneezing all year round rec Stop corgard  and start bisoprolol  5 mg daily - if too strong break in half, if too weak double to take one twice daily     Katherine Foley rec 09/21/22 Pulmonary function testing today continues to show known obstructive lung disease, stable compared to last breathing test in 2016 Imaging of your lungs showed Minimal linear scarring. No evidence of fibrosis/interstitial lung disease. Mild tracheomalacia  Recommendations Continue Breztri  Aerosphere 2 puffs morning at bedtime Continue Zyrtec 10 mg daily Continue Flonase  nasal spray as directed Add Atrovent  (ipratropium) nasal spray twice daily for PND Continue reflux medication      04/09/2024  f/u ov/Katherine Foley re: GOLD 2 copd    maint on Breztri   2 each am  Chief Complaint  Patient presents with   Follow-up    COPD Pt has no concerns.   Dyspnea:  [employer] nl pace ok / walking dog involves hills   20 ming bid  and has to stop half  way  due to doe  Cough: min mucoid worse in am but not noct  Sleeping: flat bed one pillow s  resp cc  SABA use: once or twice a month/ no nebs  02:  none       No obvious day to day or daytime variability or assoc excess/ purulent sputum or mucus plugs or hemoptysis or cp or chest tightness, subjective wheeze or overt sinus or hb symptoms.    Also denies any obvious fluctuation of symptoms with weather or environmental changes or other aggravating or alleviating factors except as outlined above   No unusual exposure hx or h/o childhood pna/ asthma or knowledge of premature birth.  Current Allergies, Complete Past Medical History, Past Surgical History, Family History, and Social History were reviewed in [employer] record.  ROS  The following are not active complaints unless bolded Hoarseness, sore throat, dysphagia, dental problems, itching, sneezing,  nasal congestion or discharge of excess mucus or purulent secretions, ear ache,   fever, chills, sweats, unintended wt loss or wt gain, classically pleuritic or exertional cp,  orthopnea pnd or arm/hand swelling  or leg swelling, presyncope, palpitations, abdominal pain, anorexia, nausea, vomiting, diarrhea  or change in bowel habits or change in bladder habits, change in stools or change in urine, dysuria, hematuria,  rash, arthralgias, visual complaints, headache, numbness, weakness or ataxia or problems with walking or coordination,  change in mood or  memory.        Current Meds  Medication Sig   acetaminophen  (TYLENOL ) 650 MG CR tablet Take 1,300 mg by mouth every 8 (eight) hours as needed for pain.   albuterol  (VENTOLIN  HFA) 108 (90 Base) MCG/ACT inhaler Inhale 2 puffs into the lungs every 6 (six) hours as needed for wheezing or shortness of breath.   amLODipine  (NORVASC ) 2.5 MG tablet TAKE 1 TABLET BY MOUTH ONCE DAILY   aspirin  EC 81 MG tablet Take 1 tablet (81 mg total) by mouth daily.   azelastine  (ASTELIN ) 0.1 % nasal spray Place 2 sprays into both nostrils 2 (two) times daily. Use in each nostril as directed   Budeson-Glycopyrrol-Formoterol  (BREZTRI   AEROSPHERE) 160-9-4.8 MCG/ACT AERO Inhale 2 puffs into the lungs in the morning and at bedtime.   buPROPion  (WELLBUTRIN  XL) 300 MG 24 hr tablet TAKE 1 TABLET BY MOUTH ONCE DAILY   Calcium  Carb-Cholecalciferol (CALCIUM  500/D PO) Take 2 tablets by mouth daily. Mag and zinc added   desvenlafaxine  (PRISTIQ ) 100 MG 24 hr tablet TAKE 1 TABLET BY MOUTH EVERY MORNING   dexlansoprazole  (DEXILANT ) 60 MG capsule Take 1 capsule (60 mg total) by mouth daily.   ezetimibe  (ZETIA ) 10 MG tablet TAKE 1 TABLET BY MOUTH ONCE DAILY   fluticasone  (CUTIVATE ) 0.05 % cream Apply 1 Application topically 2 (two) times daily as needed (eczema).   gabapentin  (NEURONTIN ) 300 MG capsule TAKE 1 CAPSULE BY MOUTH EVERY DAY AT BEDTIME   ipratropium (ATROVENT ) 0.06 % nasal spray Place 2 sprays into both nostrils 2 (two) times daily as needed for rhinitis (drainage).   ipratropium-albuterol  (DUONEB) 0.5-2.5 (3) MG/3ML SOLN Take 3 mLs by nebulization every 6 (six) hours as needed.   isosorbide  mononitrate (IMDUR ) 30 MG 24 hr tablet TAKE 1/2 TABLET BY MOUTH ONCE DAILY   levofloxacin  (LEVAQUIN ) 500 MG tablet Take 1 tablet (500 mg total) by mouth daily for 7 days.   LORazepam  (ATIVAN ) 0.5 MG tablet TAKE 1 TABLET BY MOUTH TWICE DAILY AS NEEDED FOR ANXIETY *DO NOT DRIVE FOR 8 HOURS AFTER USING* *MAY FILL MONTHLY, NEXT DELIVERY 09/27/23*   metoCLOPramide  (REGLAN ) 5 MG tablet Take 1 tablet (5 mg total) by mouth once for 1 dose.   Multiple Vitamins-Minerals (MULTIVITAMIN ADULT PO) Take 1 tablet by mouth daily.   nitroGLYCERIN  (NITROSTAT ) 0.4 MG SL tablet Place 1 tablet (0.4 mg total) under the tongue every 5 (five) minutes as needed for chest pain (if pain does not resolve with 2 doses, call 911).   rosuvastatin  (CRESTOR ) 40 MG tablet TAKE 1 TABLET BY MOUTH ONCE DAILY   triamcinolone  (NASACORT ) 55 MCG/ACT AERO nasal inhaler Place 1 spray into the nose daily. Start with 1 spray each side twice a day for 3 days, then reduce to daily.    valACYclovir  (VALTREX ) 1000 MG tablet TAKE 2 TABLETS BY MOUTH TWICE DAILY FOR 1 DAY AT FIRST SIGN OF COLD SORE *REFILL REQUEST*           Objective:  Physical Exam  amb wf nad   04/09/2024          122  03/08/2015          143     01/25/15 144 lb (65.318 kg)  01/06/15 149 lb 9.6 oz (67.858 kg)  12/09/14 147 lb 14.4 oz (67.087 kg)    Vital signs reviewed  04/09/2024  - Note at rest 02 sats  97% on RA   General appearance:    somber amb wf nad   HEENT : Oropharynx  nl   Nasal turbinates nl  NECK :  without  apparent JVD/ palpable Nodes/TM    LUNGS: no acc muscle use,  Mild barrel  contour chest wall with bilateral  Distant bs s audible wheeze and  without cough on insp or exp maneuvers  and mild  Hyperresonant  to  percussion bilaterally     CV:  RRR  no s3 or murmur or increase in P2, and no edema   ABD:  soft and nontender w   MS:  Nl gait/ ext warm without deformities Or obvious joint restrictions  calf tenderness, cyanosis or clubbing     SKIN: warm and dry without lesions    NEURO:  alert, approp, nl sensorium with  no motor or cerebellar deficits apparent.      I personally reviewed images and agree with radiology impression as follows:  CXR:   pa and lateral  01/28/24  COPD/chronic changes. No active disease.         Assessment & Plan:   Assessment & Plan COPD  GOLD 2 Quit smoking 2007 - spirometry 10/02/10  FEV1 1.17/ FVC 1.64 ratio 71%  - 01/25/2015 p extensive coaching HFA effectiveness =    75%  - 01/25/2015 rec  d/c corgard  and return for full pfts  PFTs 03/08/2015   FEV1  1.28 (58%) ratio 57 no change p saba/ dlco 72% p am symb/spiriva   - 04/09/2024  After extensive coaching inhaler device,  effectiveness =    75% with HFA so try breztri    Group D (now reclassified as E) in terms of symptom/risk and laba/lama/ICS  therefore appropriate rx at this point >>>  breztri  if insurance covers or go back to symbicort /spiriva  plus approp saba:  Re SABA :  I spent  extra time with pt today reviewing appropriate use of albuterol  for prn use on exertion with the following points: 1) saba is for relief of sob that does not improve by walking a slower pace or resting but rather if the pt does not improve after trying this first. 2) If the pt is convinced, as many are, that saba helps recover from activity faster then it's easy to tell if this is the case by re-challenging : ie stop, take the inhaler, then p 5 minutes try the exact same activity (intensity of workload) that just caused the symptoms and see if they are substantially diminished or not after saba 3) if there is an activity that reproducibly causes the symptoms, try the saba 15 min before the activity on alternate days   If in fact the saba really does help, then fine to continue to use it prn but advised may need to look closer at the maintenance regimen being used to achieve better control of airways disease with exertion.   F/u can be in 3  with NP and me prn          Each maintenance medication was reviewed in detail including emphasizing most importantly the difference between maintenance and prns and under what circumstances the prns are to be triggered using an action plan format where appropriate.  Total time for H and P, chart review, counseling, reviewing hfa/neb device(s) and generating customized AVS unique to this office visit / same day charting = 31 min             Patient Instructions  Plan A = Automatic = Always=   Breztri  Take 2 puffs first thing in am and then another 2 puffs about 12 hours later if you're not doing  great  Work on inhaler technique:  relax and gently blow all the way out then take a nice smooth full deep breath back in, triggering the inhaler at same time you start breathing in.  Hold breath in for at least  5 seconds if you can. Blow out breztri  thru nose. Rinse and gargle with water when done.  If mouth or throat bother you at all,  try brushing  teeth/gums/tongue with arm and hammer toothpaste/ make a slurry and gargle and spit out.      Plan B = Backup (to supplement plan A, not to replace it) Use your albuterol  inhaler as a rescue medication to be used if you can't catch your breath by resting or slowing your pace  or doing a relaxed purse lip breathing pattern.  - The less you use it, the better it will work when you need it. - Ok to use the inhaler up to 2 puffs  every 4 hours if you must but call for appointment if use goes up over your usual need - Don't leave home without it !!  (think of it like the spare tire or starter fluid for your car)   Plan C = Crisis (instead of Plan B but only if Plan B stops working) - only use your albuterol  nebulizer if you first try Plan B and it fails to help > ok to use the nebulizer up to every 4 hours but if start needing it regularly call for immediate appointment   Also  Ok to try albuterol  15 min before an activity (on alternating days)  that you know would usually make you short of breath and see if it makes any difference and if makes none then don't take albuterol  after activity unless you can't catch your breath as this means it's the resting that helps, not the albuterol .   Please schedule a follow up visit in 6  months but call sooner if needed        Ozell America, MD 04/09/2024

## 2024-04-09 ENCOUNTER — Ambulatory Visit: Admitting: Internal Medicine

## 2024-04-09 ENCOUNTER — Encounter: Payer: Self-pay | Admitting: Internal Medicine

## 2024-04-09 VITALS — BP 128/64 | HR 74 | Temp 98.1°F | Ht 60.0 in | Wt 122.0 lb

## 2024-04-09 DIAGNOSIS — J449 Chronic obstructive pulmonary disease, unspecified: Secondary | ICD-10-CM | POA: Diagnosis not present

## 2024-04-09 DIAGNOSIS — Z87891 Personal history of nicotine dependence: Secondary | ICD-10-CM

## 2024-04-09 NOTE — Assessment & Plan Note (Addendum)
 Quit smoking 2007 - spirometry 10/02/10  FEV1 1.17/ FVC 1.64 ratio 71%  - 01/25/2015 p extensive coaching HFA effectiveness =    75%  - 01/25/2015 rec  d/c corgard  and return for full pfts  PFTs 03/08/2015   FEV1  1.28 (58%) ratio 57 no change p saba/ dlco 72% p am symb/spiriva   - 04/09/2024  After extensive coaching inhaler device,  effectiveness =    75% with HFA so try breztri    Group D (now reclassified as E) in terms of symptom/risk and laba/lama/ICS  therefore appropriate rx at this point >>>  breztri  if insurance covers or go back to symbicort /spiriva  plus approp saba:  Re SABA :  I spent extra time with pt today reviewing appropriate use of albuterol  for prn use on exertion with the following points: 1) saba is for relief of sob that does not improve by walking a slower pace or resting but rather if the pt does not improve after trying this first. 2) If the pt is convinced, as many are, that saba helps recover from activity faster then it's easy to tell if this is the case by re-challenging : ie stop, take the inhaler, then p 5 minutes try the exact same activity (intensity of workload) that just caused the symptoms and see if they are substantially diminished or not after saba 3) if there is an activity that reproducibly causes the symptoms, try the saba 15 min before the activity on alternate days   If in fact the saba really does help, then fine to continue to use it prn but advised may need to look closer at the maintenance regimen being used to achieve better control of airways disease with exertion.   F/u can be in 3  with NP and me prn          Each maintenance medication was reviewed in detail including emphasizing most importantly the difference between maintenance and prns and under what circumstances the prns are to be triggered using an action plan format where appropriate.  Total time for H and P, chart review, counseling, reviewing hfa/neb device(s) and generating customized AVS  unique to this office visit / same day charting = 31 min

## 2024-04-09 NOTE — Patient Instructions (Addendum)
 Plan A = Automatic = Always=   Breztri  Take 2 puffs first thing in am and then another 2 puffs about 12 hours later if you're not doing great  Work on inhaler technique:  relax and gently blow all the way out then take a nice smooth full deep breath back in, triggering the inhaler at same time you start breathing in.  Hold breath in for at least  5 seconds if you can. Blow out breztri  thru nose. Rinse and gargle with water when done.  If mouth or throat bother you at all,  try brushing teeth/gums/tongue with arm and hammer toothpaste/ make a slurry and gargle and spit out.      Plan B = Backup (to supplement plan A, not to replace it) Use your albuterol  inhaler as a rescue medication to be used if you can't catch your breath by resting or slowing your pace  or doing a relaxed purse lip breathing pattern.  - The less you use it, the better it will work when you need it. - Ok to use the inhaler up to 2 puffs  every 4 hours if you must but call for appointment if use goes up over your usual need - Don't leave home without it !!  (think of it like the spare tire or starter fluid for your car)   Plan C = Crisis (instead of Plan B but only if Plan B stops working) - only use your albuterol  nebulizer if you first try Plan B and it fails to help > ok to use the nebulizer up to every 4 hours but if start needing it regularly call for immediate appointment   Also  Ok to try albuterol  15 min before an activity (on alternating days)  that you know would usually make you short of breath and see if it makes any difference and if makes none then don't take albuterol  after activity unless you can't catch your breath as this means it's the resting that helps, not the albuterol .   Please schedule a follow up visit in 6  months but call sooner if needed

## 2024-04-16 ENCOUNTER — Ambulatory Visit
Admission: RE | Admit: 2024-04-16 | Discharge: 2024-04-16 | Disposition: A | Discharge status: Home / Self Care | Source: Ambulatory Visit | Attending: Family Medicine | Admitting: Family Medicine

## 2024-04-16 DIAGNOSIS — Z1231 Encounter for screening mammogram for malignant neoplasm of breast: Secondary | ICD-10-CM | POA: Diagnosis not present

## 2024-04-20 ENCOUNTER — Other Ambulatory Visit: Payer: Self-pay | Admitting: Family Medicine

## 2024-04-20 DIAGNOSIS — R928 Other abnormal and inconclusive findings on diagnostic imaging of breast: Secondary | ICD-10-CM

## 2024-04-23 ENCOUNTER — Other Ambulatory Visit: Payer: Self-pay | Admitting: Cardiovascular Disease

## 2024-04-28 ENCOUNTER — Ambulatory Visit

## 2024-04-28 ENCOUNTER — Ambulatory Visit
Admission: RE | Admit: 2024-04-28 | Discharge: 2024-04-28 | Disposition: A | Discharge status: Home / Self Care | Source: Ambulatory Visit | Attending: Family Medicine | Admitting: Family Medicine

## 2024-04-28 DIAGNOSIS — R928 Other abnormal and inconclusive findings on diagnostic imaging of breast: Secondary | ICD-10-CM

## 2024-05-25 ENCOUNTER — Other Ambulatory Visit: Payer: Self-pay | Admitting: Family Medicine

## 2024-06-03 DIAGNOSIS — F4381 Prolonged grief disorder: Secondary | ICD-10-CM | POA: Diagnosis not present

## 2024-06-09 ENCOUNTER — Ambulatory Visit: Attending: Cardiovascular Disease | Admitting: Cardiovascular Disease

## 2024-06-09 ENCOUNTER — Encounter: Payer: Self-pay | Admitting: Cardiovascular Disease

## 2024-06-09 VITALS — BP 120/60 | HR 68 | Ht 60.0 in | Wt 124.0 lb

## 2024-06-09 DIAGNOSIS — E782 Mixed hyperlipidemia: Secondary | ICD-10-CM | POA: Diagnosis not present

## 2024-06-09 DIAGNOSIS — I10 Essential (primary) hypertension: Secondary | ICD-10-CM | POA: Diagnosis not present

## 2024-06-09 DIAGNOSIS — Z952 Presence of prosthetic heart valve: Secondary | ICD-10-CM | POA: Diagnosis not present

## 2024-06-09 DIAGNOSIS — I700 Atherosclerosis of aorta: Secondary | ICD-10-CM

## 2024-06-09 DIAGNOSIS — I25118 Atherosclerotic heart disease of native coronary artery with other forms of angina pectoris: Secondary | ICD-10-CM | POA: Diagnosis not present

## 2024-06-09 NOTE — Assessment & Plan Note (Signed)
 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  Her last echocardiogram in December 2024 showed a mean gradient of 22 mmHg with no aortic insufficiency.  Her mean gradient has increased over the past few years.  In 2023 the mean gradient was 17 mmHg, in 2022 it was actually higher at 24 mmHg and in 2019 it was 10 mmHg.  The overall trend has been a modest increase.  At 14 years out from surgery, she should continue with annual surveillance.

## 2024-06-09 NOTE — Patient Instructions (Signed)
 Medication Instructions:  No medication changes were made at this visit. Continue current regimen.   *If you need a refill on your cardiac medications before your next appointment, please call your pharmacy*  Lab Work: None ordered today. If you have labs (blood work) drawn today and your tests are completely normal, you will receive your results only by: MyChart Message (if you have MyChart) OR A paper copy in the mail If you have any lab test that is abnormal or we need to change your treatment, we will call you to review the results.  Testing/Procedures: Your physician has requested that you have an echocardiogram in December 2025. Echocardiography is a painless test that uses sound waves to create images of your heart. It provides your doctor with information about the size and shape of your heart and how well your heart's chambers and valves are working. This procedure takes approximately one hour. There are no restrictions for this procedure. Please do NOT wear cologne, perfume, aftershave, or lotions (deodorant is allowed). Please arrive 15 minutes prior to your appointment time.  Please note: We ask at that you not bring children with you during ultrasound (echo/ vascular) testing. Due to room size and safety concerns, children are not allowed in the ultrasound rooms during exams. Our front office staff cannot provide observation of children in our lobby area while testing is being conducted. An adult accompanying a patient to their appointment will only be allowed in the ultrasound room at the discretion of the ultrasound technician under special circumstances. We apologize for any inconvenience.   Follow-Up: At [facility], you and your health needs are our priority.  As part of our continuing mission to provide you with exceptional heart care, our providers are all part of one team.  This team includes your primary Cardiologist (physician) and Advanced Practice Providers or  APPs (Physician Assistants and Nurse Practitioners) who all work together to provide you with the care you need, when you need it.  Your next appointment:   1 year(s)  Provider:   Ozell Fell, MD

## 2024-06-09 NOTE — Assessment & Plan Note (Signed)
 Blood pressure well-controlled on a combination of amlodipine , isosorbide .

## 2024-06-09 NOTE — Assessment & Plan Note (Signed)
 Patient treated with aspirin  and a statin drug.

## 2024-06-09 NOTE — Assessment & Plan Note (Signed)
 Treated with ezetimibe  and rosuvastatin .  Last LDL cholesterol is 44.

## 2024-06-09 NOTE — Assessment & Plan Note (Signed)
 Clinically stable with no exertional angina.  Continue current medical therapy.  Antianginal therapy includes amlodipine  and isosorbide .

## 2024-06-09 NOTE — Progress Notes (Signed)
 Cardiology Office Note:    Date:  06/09/2024   ID:  Katherine Foley, DOB 01/01/1951, MRN [id number]  PCP:  Katrinka Garnette KIDD, MD   [facility] Providers Cardiologist:  Ozell Fell, MD Cardiology APP:  Lelon Glendia ONEIDA DEVONNA     Referring MD: Katrinka Garnette KIDD, MD   Chief Complaint  Patient presents with   Coronary Artery Disease    History of Present Illness:    Katherine Foley is a 73 y.o. female with a hx of:  Coronary artery disease  NSTEMI in 2011 >> BMS to RCA  S/p DES to RI in 2012 S/p CABG in 2012 (L-LAD) + AVR Cath in 9/19: RI and RCA stents ok; L-LAD atretic, mild disease in mLAD >> med Rx LHC 07/23/2022: mLAD 30; RI stent patent with 30 ISR; mLCx 25; pRCA 30, mRCA stent patent with 30 ISR, dRCA 40-med Rx Aortic valve Dz s/p bioprosthetic AVR in 2012 Echo 10/22: EF 60-65, AVR gradient 24 mmHg (? from 10 mmHg in 2019) Echo 06/11/2022: EF 60-65, no RWMA, mild reduced RVSF, s/p AVR with normal function (mean gradient 17.3 mmHg), RAP 3 Echo 08/06/2023: EF 60 to 65%, normal RV function, mean gradient 22 mmHg Asthma/COPD  Hypertension  Hyperlipidemia  L parotidectomy (path negative) Carotid artery disease US  8/21: Bilateral ICA 1-39; R subclavian stenosis  The patient is here alone today.  She has been under a lot of stress and lost one of her good friends recently to illness.  She has episodic chest pain that she relates to stress.  She denies exertional chest pain or pressure.  She does have shortness of breath which is unchanged by her report.  She denies edema, orthopnea, PND, or heart palpitations.  She reports no recent changes in her medications.  Current Medications: Current Meds  Medication Sig   acetaminophen  (TYLENOL ) 650 MG CR tablet Take 1,300 mg by mouth every 8 (eight) hours as needed for pain.   albuterol  (VENTOLIN  HFA) 108 (90 Base) MCG/ACT inhaler Inhale 2 puffs into the lungs every 6 (six) hours as needed for wheezing or shortness  of breath.   amLODipine  (NORVASC ) 2.5 MG tablet TAKE 1 TABLET BY MOUTH ONCE DAILY   aspirin  EC 81 MG tablet Take 1 tablet (81 mg total) by mouth daily.   azelastine  (ASTELIN ) 0.1 % nasal spray Place 2 sprays into both nostrils 2 (two) times daily. Use in each nostril as directed   Budeson-Glycopyrrol-Formoterol  (BREZTRI  AEROSPHERE) 160-9-4.8 MCG/ACT AERO Inhale 2 puffs into the lungs in the morning and at bedtime.   buPROPion  (WELLBUTRIN  XL) 300 MG 24 hr tablet TAKE 1 TABLET BY MOUTH ONCE DAILY   Calcium  Carb-Cholecalciferol (CALCIUM  500/D PO) Take 2 tablets by mouth daily. Mag and zinc added   desvenlafaxine  (PRISTIQ ) 100 MG 24 hr tablet TAKE 1 TABLET BY MOUTH EVERY MORNING   dexlansoprazole  (DEXILANT ) 60 MG capsule Take 1 capsule (60 mg total) by mouth daily.   ezetimibe  (ZETIA ) 10 MG tablet TAKE 1 TABLET BY MOUTH ONCE DAILY   fluticasone  (CUTIVATE ) 0.05 % cream Apply 1 Application topically 2 (two) times daily as needed (eczema).   gabapentin  (NEURONTIN ) 300 MG capsule TAKE 1 CAPSULE BY MOUTH EVERY DAY AT BEDTIME   ipratropium (ATROVENT ) 0.06 % nasal spray Place 2 sprays into both nostrils 2 (two) times daily as needed for rhinitis (drainage).   ipratropium-albuterol  (DUONEB) 0.5-2.5 (3) MG/3ML SOLN Take 3 mLs by nebulization every 6 (six) hours as needed.   isosorbide  mononitrate (  IMDUR ) 30 MG 24 hr tablet Take 0.5 tablets (15 mg total) by mouth daily. Pt needs to keep upcoming appt in Oct for further refills   LORazepam  (ATIVAN ) 0.5 MG tablet TAKE 1 TABLET BY MOUTH TWICE DAILY AS NEEDED FOR ANXIETY *DO NOT DRIVE FOR 8 HOURS AFTER USING* *MAY FILL MONTHLY, NEXT DELIVERY 09/27/23*   Multiple Vitamins-Minerals (MULTIVITAMIN ADULT PO) Take 1 tablet by mouth daily.   nitroGLYCERIN  (NITROSTAT ) 0.4 MG SL tablet Place 1 tablet (0.4 mg total) under the tongue every 5 (five) minutes as needed for chest pain (if pain does not resolve with 2 doses, call 911).   rosuvastatin  (CRESTOR ) 40 MG tablet TAKE 1  TABLET BY MOUTH ONCE DAILY   valACYclovir  (VALTREX ) 1000 MG tablet TAKE 2 TABLETS BY MOUTH TWICE DAILY FOR 1 DAY AT FIRST SIGN OF COLD SORE *REFILL REQUEST*     Allergies:   Codeine sulfate, Hydrocodone-acetaminophen , and Augmentin  [amoxicillin -pot clavulanate]   ROS:   Please see the history of present illness.    All other systems reviewed and are negative.  EKGs/Labs/Other Studies Reviewed:    The following studies were reviewed today: Cardiac Studies & Procedures   ______________________________________________________________________________________________ CARDIAC CATHETERIZATION  CARDIAC CATHETERIZATION 07/23/2022  Conclusion   Mid LAD lesion is 30% stenosed.   Mid Cx lesion is 25% stenosed.   Prox RCA to Mid RCA lesion is 30% stenosed.   Prox RCA lesion is 30% stenosed.   Dist RCA lesion is 40% stenosed.   Ost Ramus to Ramus lesion is 30% stenosed.  Mild plaque in the ostium of the left main Mild plaque in the mid LAD Mild plaque in the mid Circumflex Patent intermediate branch stent with minimal restenosis Large dominant RCA with patent mid vessel stent with minimal restenosis. Normal right heart pressures  Recommendations: Continue medical management of CAD.  Findings Coronary Findings Diagnostic  Dominance: Right  Left Anterior Descending Mid LAD lesion is 30% stenosed.  Ramus Intermedius Vessel is large. Ost Ramus to Ramus lesion is 30% stenosed. The lesion was previously treated using a stent (unknown type) over 2 years ago.  Left Circumflex Mid Cx lesion is 25% stenosed.  Right Coronary Artery Prox RCA lesion is 30% stenosed. Prox RCA to Mid RCA lesion is 30% stenosed. The lesion was previously treated using a stent (unknown type) over 2 years ago. Dist RCA lesion is 40% stenosed.  Intervention  No interventions have been documented.   CARDIAC CATHETERIZATION  CARDIAC CATHETERIZATION 05/26/2018  Conclusion  Prox RCA to Mid RCA lesion is  25% stenosed.  Ost RCA to Prox RCA lesion is 30% stenosed.  Dist RCA lesion is 25% stenosed.  Previously placed Ramus stent (unknown type) is widely patent.  Mid Cx lesion is 25% stenosed.  Mid LAD lesion is 30% stenosed.  Patent stents.  No significnat change from 2017 angiogram.  Continue aggressive secondary prevention.  Findings Coronary Findings Diagnostic  Dominance: Right  Left Anterior Descending Mid LAD lesion is 30% stenosed.  Ramus Intermedius Vessel is large. Previously placed Ramus stent (unknown type) is widely patent.  Left Circumflex Mid Cx lesion is 25% stenosed.  Right Coronary Artery Ost RCA to Prox RCA lesion is 30% stenosed. Prox RCA to Mid RCA lesion is 25% stenosed. The lesion was previously treated. Dist RCA lesion is 25% stenosed.  Intervention  No interventions have been documented.   STRESS TESTS  MYOCARDIAL PERFUSION IMAGING 12/02/2015  Interpretation Summary  The left ventricular ejection fraction is hyperdynamic (>65%).  Nuclear  stress EF: 70%.  There was no ST segment deviation noted during stress.  The study is normal.  This is a low risk study.  Normal pharmacologic nuclear stress test with no evidence of prior infarct or ischemia.   ECHOCARDIOGRAM  ECHOCARDIOGRAM COMPLETE 08/06/2023  Narrative ECHOCARDIOGRAM REPORT    Patient Name:   SERENITEE FUERTES Hohman Date of Exam: 08/06/2023 Medical Rec #:  [id number]            Height:       62.5 in Accession #:    [id number]           Weight:       127.4 lb Date of Birth:  1951/04/26             BSA:          1.588 m Patient Age:    72 years             BP:           138/70 mmHg Patient Gender: F                    HR:           70 bpm. Exam Location:  Outpatient  Procedure: 2D Echo, Color Doppler and Cardiac Doppler  Indications:    Other, s/p AVR (21mm Edwards Magna Ease pericardial valve)  History:        Patient has prior history of Echocardiogram examinations,  most recent 06/11/2022. Aortic Valve: 21 mm Edwards valve is present in the aortic position.  Sonographer:    Tinnie Rgers RDCS Referring Phys: [id number] GLENDIA DASEN WEAVER  IMPRESSIONS   1. Left ventricular ejection fraction, by estimation, is 60 to 65%. The left ventricle has normal function. The left ventricle has no regional wall motion abnormalities. Left ventricular diastolic parameters are indeterminate. 2. Right ventricular systolic function is normal. The right ventricular size is normal. Tricuspid regurgitation signal is inadequate for assessing PA pressure. 3. The mitral valve is normal in structure. Trivial mitral valve regurgitation. No evidence of mitral stenosis. 4. The aortic valve has been repaired/replaced. Aortic valve regurgitation is not visualized. No aortic stenosis is present. There is a 21 mm Edwards valve present in the aortic position. Aortic valve mean gradient measures 22.0 mmHg. Aortic valve Vmax measures 2.95 m/s. 5. The inferior vena cava is normal in size with greater than 50% respiratory variability, suggesting right atrial pressure of 3 mmHg.  FINDINGS Left Ventricle: Left ventricular ejection fraction, by estimation, is 60 to 65%. The left ventricle has normal function. The left ventricle has no regional wall motion abnormalities. The left ventricular internal cavity size was normal in size. There is no left ventricular hypertrophy. Left ventricular diastolic parameters are indeterminate.  Right Ventricle: The right ventricular size is normal. No increase in right ventricular wall thickness. Right ventricular systolic function is normal. Tricuspid regurgitation signal is inadequate for assessing PA pressure.  Left Atrium: Left atrial size was normal in size.  Right Atrium: Right atrial size was normal in size.  Pericardium: There is no evidence of pericardial effusion.  Mitral Valve: The mitral valve is normal in structure. Trivial mitral valve regurgitation.  No evidence of mitral valve stenosis.  Tricuspid Valve: The tricuspid valve is normal in structure. Tricuspid valve regurgitation is not demonstrated. No evidence of tricuspid stenosis.  Aortic Valve: The aortic valve has been repaired/replaced. Aortic valve regurgitation is not visualized. No aortic stenosis is present. Aortic valve mean gradient  measures 22.0 mmHg. Aortic valve peak gradient measures 34.8 mmHg. Aortic valve area, by VTI measures 0.74 cm. There is a 21 mm Edwards valve present in the aortic position.  Pulmonic Valve: The pulmonic valve was normal in structure. Pulmonic valve regurgitation is not visualized. No evidence of pulmonic stenosis.  Aorta: The aortic root is normal in size and structure.  Venous: The inferior vena cava is normal in size with greater than 50% respiratory variability, suggesting right atrial pressure of 3 mmHg.  IAS/Shunts: No atrial level shunt detected by color flow Doppler.   LEFT VENTRICLE PLAX 2D LVIDd:         4.10 cm   Diastology LVIDs:         2.60 cm   LV e' medial:    4.57 cm/s LV PW:         0.90 cm   LV E/e' medial:  12.3 LV IVS:        0.80 cm   LV e' lateral:   5.22 cm/s LVOT diam:     1.90 cm   LV E/e' lateral: 10.8 LV SV:         44 LV SV Index:   28 LVOT Area:     2.84 cm   RIGHT VENTRICLE            IVC RV S prime:     9.47 cm/s  IVC diam: 0.90 cm TAPSE (M-mode): 1.0 cm  LEFT ATRIUM             Index        RIGHT ATRIUM          Index LA diam:        3.40 cm 2.14 cm/m   RA Area:     8.25 cm LA Vol (A2C):   26.3 ml 16.57 ml/m  RA Volume:   13.10 ml 8.25 ml/m LA Vol (A4C):   21.7 ml 13.67 ml/m LA Biplane Vol: 25.3 ml 15.94 ml/m AORTIC VALVE AV Area (Vmax):    0.89 cm AV Area (Vmean):   0.84 cm AV Area (VTI):     0.74 cm AV Vmax:           295.00 cm/s AV Vmean:          219.000 cm/s AV VTI:            0.595 m AV Peak Grad:      34.8 mmHg AV Mean Grad:      22.0 mmHg LVOT Vmax:         92.60 cm/s LVOT  Vmean:        64.700 cm/s LVOT VTI:          0.156 m LVOT/AV VTI ratio: 0.26  AORTA Ao Root diam: 3.00 cm  MITRAL VALVE MV Area (PHT): 2.17 cm    SHUNTS MV Decel Time: 349 msec    Systemic VTI:  0.16 m MV E velocity: 56.20 cm/s  Systemic Diam: 1.90 cm MV A velocity: 98.50 cm/s MV E/A ratio:  0.57  Kardie Tobb DO Electronically signed by Dub Huntsman DO Signature Date/Time: 08/06/2023/7:05:04 PM    Final          ______________________________________________________________________________________________      EKG:   EKG Interpretation Date/Time:  Tuesday June 09 2024 10:43:55 EDT Ventricular Rate:  68 PR Interval:  178 QRS Duration:  72 QT Interval:  410 QTC Calculation: 435 R Axis:   83  Text Interpretation: Normal sinus rhythm Nonspecific T  wave abnormality When compared with ECG of 01-Jul-2023 10:31, Nonspecific T wave abnormality has replaced inverted T waves in Lateral leads Confirmed by Wonda Sharper [phone]) on 06/09/2024 10:57:57 AM    Recent Labs: 01/06/2024: TSH 0.46 01/28/2024: Pro B Natriuretic peptide (BNP) 50.0 03/17/2024: ALT 19; BUN 11; Creatinine, Ser 0.64; Hemoglobin 11.8; Platelets 161.0; Potassium 4.3; Sodium 135  Recent Lipid Panel    Component Value Date/Time   CHOL 121 03/17/2024 1619   CHOL 131 12/27/2020 1542   TRIG 52.0 03/17/2024 1619   HDL 67.20 03/17/2024 1619   HDL 68 12/27/2020 1542   CHOLHDL 2 03/17/2024 1619   VLDL 10.4 03/17/2024 1619   LDLCALC 44 03/17/2024 1619   LDLCALC 47 12/27/2020 1542   LDLDIRECT 39 07/01/2023 1141   LDLDIRECT 65.0 06/19/2017 0901     Risk Assessment/Calculations:                Physical Exam:    VS:  BP 120/60   Pulse 68   Ht 5' (1.524 m)   Wt 124 lb (56.2 kg)   SpO2 93%   BMI 24.22 kg/m     Wt Readings from Last 3 Encounters:  06/09/24 124 lb (56.2 kg)  04/09/24 122 lb (55.3 kg)  03/17/24 121 lb 6.4 oz (55.1 kg)     GEN:  Well nourished, well developed in no acute  distress HEENT: Normal NECK: No JVD; No carotid bruits LYMPHATICS: No lymphadenopathy CARDIAC: RRR, 2/6 harsh mid peaking crescendo decrescendo murmur at the right upper sternal border RESPIRATORY:  Clear to auscultation without rales, wheezing or rhonchi  ABDOMEN: Soft, non-tender, non-distended MUSCULOSKELETAL:  No edema; No deformity  SKIN: Warm and dry NEUROLOGIC:  Alert and oriented x 3 PSYCHIATRIC:  Normal affect   Assessment & Plan S/P AVR (aortic valve replacement) The patient is 14 years out from bioprosthetic aortic valve replacement.  Her last echocardiogram in December 2024 showed a mean gradient of 22 mmHg with no aortic insufficiency.  Her mean gradient has increased over the past few years.  In 2023 the mean gradient was 17 mmHg, in 2022 it was actually higher at 24 mmHg and in 2019 it was 10 mmHg.  The overall trend has been a modest increase.  At 14 years out from surgery, she should continue with annual surveillance. Aortic atherosclerosis Patient treated with aspirin  and a statin drug. Coronary artery disease of native artery of native heart with stable angina pectoris Clinically stable with no exertional angina.  Continue current medical therapy.  Antianginal therapy includes amlodipine  and isosorbide . Mixed hyperlipidemia Treated with ezetimibe  and rosuvastatin .  Last LDL cholesterol is 44. Essential hypertension Blood pressure well-controlled on a combination of amlodipine , isosorbide .     Medication Adjustments/Labs and Tests Ordered: Current medicines are reviewed at length with the patient today.  Concerns regarding medicines are outlined above.  Orders Placed This Encounter  Procedures   EKG 12-Lead   ECHOCARDIOGRAM COMPLETE   No orders of the defined types were placed in this encounter.   Patient Instructions  Medication Instructions:  No medication changes were made at this visit. Continue current regimen.   *If you need a refill on your cardiac  medications before your next appointment, please call your pharmacy*  Lab Work: None ordered today. If you have labs (blood work) drawn today and your tests are completely normal, you will receive your results only by: MyChart Message (if you have MyChart) OR A paper copy in the mail If you have any  lab test that is abnormal or we need to change your treatment, we will call you to review the results.  Testing/Procedures: Your physician has requested that you have an echocardiogram in December 2025. Echocardiography is a painless test that uses sound waves to create images of your heart. It provides your doctor with information about the size and shape of your heart and how well your heart's chambers and valves are working. This procedure takes approximately one hour. There are no restrictions for this procedure. Please do NOT wear cologne, perfume, aftershave, or lotions (deodorant is allowed). Please arrive 15 minutes prior to your appointment time.  Please note: We ask at that you not bring children with you during ultrasound (echo/ vascular) testing. Due to room size and safety concerns, children are not allowed in the ultrasound rooms during exams. Our front office staff cannot provide observation of children in our lobby area while testing is being conducted. An adult accompanying a patient to their appointment will only be allowed in the ultrasound room at the discretion of the ultrasound technician under special circumstances. We apologize for any inconvenience.   Follow-Up: At [facility], you and your health needs are our priority.  As part of our continuing mission to provide you with exceptional heart care, our providers are all part of one team.  This team includes your primary Cardiologist (physician) and Advanced Practice Providers or APPs (Physician Assistants and Nurse Practitioners) who all work together to provide you with the care you need, when you need it.  Your next  appointment:   1 year(s)  Provider:   Ozell Fell, MD      Signed, Ozell Fell, MD  06/09/2024 1:27 PM    [facility]

## 2024-06-16 DIAGNOSIS — R310 Gross hematuria: Secondary | ICD-10-CM | POA: Diagnosis not present

## 2024-06-16 DIAGNOSIS — R300 Dysuria: Secondary | ICD-10-CM | POA: Diagnosis not present

## 2024-06-16 DIAGNOSIS — R399 Unspecified symptoms and signs involving the genitourinary system: Secondary | ICD-10-CM | POA: Diagnosis not present

## 2024-06-23 ENCOUNTER — Other Ambulatory Visit: Payer: Self-pay | Admitting: Family Medicine

## 2024-06-23 ENCOUNTER — Other Ambulatory Visit: Payer: Self-pay | Admitting: Cardiovascular Disease

## 2024-06-23 ENCOUNTER — Ambulatory Visit: Payer: Medicare HMO

## 2024-06-24 ENCOUNTER — Other Ambulatory Visit: Payer: Self-pay | Admitting: Cardiovascular Disease

## 2024-06-30 ENCOUNTER — Ambulatory Visit (INDEPENDENT_AMBULATORY_CARE_PROVIDER_SITE_OTHER)

## 2024-06-30 VITALS — Ht 60.0 in | Wt 128.0 lb

## 2024-06-30 DIAGNOSIS — Z0000 Encounter for general adult medical examination without abnormal findings: Secondary | ICD-10-CM

## 2024-06-30 NOTE — Patient Instructions (Signed)
 Ms. Hipps,  Thank you for taking the time for your Medicare Wellness Visit. I appreciate your continued commitment to your health goals. Please review the care plan we discussed, and feel free to reach out if I can assist you further.  Medicare recommends these wellness visits once per year to help you and your care team stay ahead of potential health issues. These visits are designed to focus on prevention, allowing your provider to concentrate on managing your acute and chronic conditions during your regular appointments.  Please note that Annual Wellness Visits do not include a physical exam. Some assessments may be limited, especially if the visit was conducted virtually. If needed, we may recommend a separate in-person follow-up with your provider.  Ongoing Care Seeing your primary care provider every 3 to 6 months helps us  monitor your health and provide consistent, personalized care.   Referrals If a referral was made during today's visit and you haven't received any updates within two weeks, please contact the referred provider directly to check on the status.  Recommended Screenings:  Health Maintenance  Topic Date Due   Eye exam for diabetics  Never done   Flu Shot  04/03/2024   COVID-19 Vaccine (4 - 2025-26 season) 05/04/2024   Medicare Annual Wellness Visit  06/09/2024   Hemoglobin A1C  07/08/2024   Yearly kidney health urinalysis for diabetes  01/08/2025   Yearly kidney function blood test for diabetes  03/17/2025   Complete foot exam   03/17/2025   Colon Cancer Screening  12/27/2026   DTaP/Tdap/Td vaccine (3 - Tdap) 03/17/2030   Pneumococcal Vaccine for age over 19  Completed   DEXA scan (bone density measurement)  Completed   Hepatitis C Screening  Completed   Zoster (Shingles) Vaccine  Completed   Meningitis B Vaccine  Aged Out   Breast Cancer Screening  Discontinued       06/10/2023    8:40 AM  Advanced Directives  Does Patient Have a Medical Advance Directive?  No  Would patient like information on creating a medical advance directive? No - Patient declined   Advance Care Planning is important because it: Ensures you receive medical care that aligns with your values, goals, and preferences. Provides guidance to your family and loved ones, reducing the emotional burden of decision-making during critical moments.  Vision: Annual vision screenings are recommended for early detection of glaucoma, cataracts, and diabetic retinopathy. These exams can also reveal signs of chronic conditions such as diabetes and high blood pressure.  Dental: Annual dental screenings help detect early signs of oral cancer, gum disease, and other conditions linked to overall health, including heart disease and diabetes.  Please see the attached documents for additional preventive care recommendations.

## 2024-06-30 NOTE — Progress Notes (Signed)
 Subjective:   Katherine Foley is a 73 y.o. who presents for a Medicare Wellness preventive visit.  As a reminder, Annual Wellness Visits don't include a physical exam, and some assessments may be limited, especially if this visit is performed virtually. We may recommend an in-person follow-up visit with your provider if needed.  Visit Complete: Virtual I connected with  Katherine Foley on 06/30/24 by a video and audio enabled telemedicine application and verified that I am speaking with the correct person using two identifiers.  Patient Location: Home  Provider Location: Office/Clinic  I discussed the limitations of evaluation and management by telemedicine. The patient expressed understanding and agreed to proceed.  Vital Signs: Because this visit was a virtual/telehealth visit, some criteria may be missing or patient reported. Any vitals not documented were not able to be obtained and vitals that have been documented are patient reported.    Persons Participating in Visit: Patient.  AWV Questionnaire: No: Patient Medicare AWV questionnaire was not completed prior to this visit.  Cardiac Risk Factors include: advanced age (>42men, >75 women);diabetes mellitus;dyslipidemia;hypertension     Objective:    Today's Vitals   06/30/24 1519  Weight: 128 lb (58.1 kg)  Height: 5' (1.524 m)   Body mass index is 25 kg/m.     06/30/2024    3:31 PM 06/10/2023    8:40 AM 07/23/2022    7:56 AM 06/07/2022    8:25 AM 05/09/2022   11:53 AM 05/27/2021   11:29 PM 05/19/2021    1:49 PM  Advanced Directives  Does Patient Have a Medical Advance Directive? No No No Yes Yes No Yes  Type of Theme Park Manager;Living will Healthcare Power of [employer] Power of Attorney  Does patient want to make changes to medical advance directive?     No - Patient declined    Copy of Healthcare Power of Attorney in Chart?    No - copy requested No - copy  requested  No - copy requested  Would patient like information on creating a medical advance directive? No - Patient declined No - Patient declined No - Patient declined        Current Medications (verified) Outpatient Encounter Medications as of 06/30/2024  Medication Sig   acetaminophen  (TYLENOL ) 650 MG CR tablet Take 1,300 mg by mouth every 8 (eight) hours as needed for pain.   albuterol  (VENTOLIN  HFA) 108 (90 Base) MCG/ACT inhaler Inhale 2 puffs into the lungs every 6 (six) hours as needed for wheezing or shortness of breath.   amLODipine  (NORVASC ) 2.5 MG tablet TAKE 1 TABLET BY MOUTH ONCE DAILY   aspirin  EC 81 MG tablet Take 1 tablet (81 mg total) by mouth daily.   azelastine  (ASTELIN ) 0.1 % nasal spray Place 2 sprays into both nostrils 2 (two) times daily. Use in each nostril as directed   Budeson-Glycopyrrol-Formoterol  (BREZTRI  AEROSPHERE) 160-9-4.8 MCG/ACT AERO Inhale 2 puffs into the lungs in the morning and at bedtime.   buPROPion  (WELLBUTRIN  XL) 300 MG 24 hr tablet TAKE 1 TABLET BY MOUTH ONCE DAILY   Calcium  Carb-Cholecalciferol (CALCIUM  500/D PO) Take 2 tablets by mouth daily. Mag and zinc added   desvenlafaxine  (PRISTIQ ) 100 MG 24 hr tablet TAKE 1 TABLET BY MOUTH EVERY MORNING   dexlansoprazole  (DEXILANT ) 60 MG capsule TAKE 1 CAPSULE BY MOUTH DAILY   ezetimibe  (ZETIA ) 10 MG tablet TAKE 1 TABLET BY MOUTH ONCE DAILY   fluticasone  (CUTIVATE ) 0.05 %  cream Apply 1 Application topically 2 (two) times daily as needed (eczema).   gabapentin  (NEURONTIN ) 300 MG capsule TAKE 1 CAPSULE BY MOUTH EVERY DAY AT BEDTIME   ipratropium (ATROVENT ) 0.06 % nasal spray Place 2 sprays into both nostrils 2 (two) times daily as needed for rhinitis (drainage).   ipratropium-albuterol  (DUONEB) 0.5-2.5 (3) MG/3ML SOLN Take 3 mLs by nebulization every 6 (six) hours as needed.   isosorbide  mononitrate (IMDUR ) 30 MG 24 hr tablet TAKE 0.5 TABLETS (15 MG TOTAL) BY MOUTH DAILY. PT NEEDS TO KEEP UPCOMING APPT IN OCT  FOR FURTHER REFILLS   LORazepam  (ATIVAN ) 0.5 MG tablet TAKE 1 TABLET BY MOUTH TWICE DAILY AS NEEDED FOR ANXIETY *DO NOT DRIVE FOR 8 HOURS AFTER USING* *MAY FILL MONTHLY, NEXT DELIVERY 09/27/23*   Multiple Vitamins-Minerals (MULTIVITAMIN ADULT PO) Take 1 tablet by mouth daily.   nitroGLYCERIN  (NITROSTAT ) 0.4 MG SL tablet Place 1 tablet (0.4 mg total) under the tongue every 5 (five) minutes as needed for chest pain (if pain does not resolve with 2 doses, call 911).   rosuvastatin  (CRESTOR ) 40 MG tablet TAKE 1 TABLET BY MOUTH ONCE DAILY   valACYclovir  (VALTREX ) 1000 MG tablet TAKE 2 TABLETS BY MOUTH TWICE DAILY FOR 1 DAY AT FIRST SIGN OF COLD SORE *REFILL REQUEST*   [DISCONTINUED] Aclidinium Bromide  (TUDORZA PRESSAIR ) 400 MCG/ACT AEPB Inhale 1 puff into the lungs 2 (two) times daily.   No facility-administered encounter medications on file as of 06/30/2024.    Allergies (verified) Codeine sulfate, Hydrocodone-acetaminophen , and Augmentin  [amoxicillin -pot clavulanate]   History: Past Medical History:  Diagnosis Date   Adenomatous colon polyp    Angio-edema    Anxiety    Anxiety/Depression 02/15/2009   Aortic stenosis s/p bioprosthetic AVR 07/07/2010   Echo 9/19:  Mild conc LVH, EF 60-65, no RWMA, Gr 1 DD, AVR ok (mean 10, peak 21), trivial MR, normal RVSF, mild TR // Echo 10/22: EF 60-65, no RWMA, normal RVSF, AVR with increased gradients (mean 24 increased from 10 in 2019) // Echocardiogram 10/23: EF 60-65, no RWMA, mildly reduced RVSF, AVR w mean gradient 17.3 mmHg   Asthma    Barrett's esophagus    04/2007; 2000   Carotid stenosis 05/17/2010   a. dopplers 11/11: RICA 40-50%; LICA 0-39% (follow up due 11/12)   COPD 07/23/2007   Coronary Artery Disease 05/17/2010   NSTEMI 8/11 - BMS to RCA;  cath 4/12: dLAD 50%, RI 80% (PCI), AVCFX 30%, pRCA 30%, stent ok, 40-50, then 50% dist RCA;  c. s/p Promus DES to RI 12/25/10;  d. 04/2011 CABG x 1 (LIMA->LAD) @ time of AVR // Cath in 9/19: RI and RCA  stents ok; L-LAD atretic, mild disease in mLAD >> med Rx   Eczema    Family history of breast cancer    2 daughters; PALB2 mutation in family   Genetic testing 02/10/2018   PALB2 analysis at [facility] - Negative for familial mutation   GERD    H/O hiatal hernia    Hemorrhoid thrombosis    had it lanced (03/25/2013)   Herpes simplex without mention of complication 01/16/2010   History of blood transfusion    'w/OHS (03/25/2013)   Hyperlipidemia 08/29/2009   Hypertension 04/14/2007   Migraine HAs 04/20/2008   Myocardial infarction (HCC) 2011   during catherization (03/25/2013)   OSTEOPENIA 04/14/2007   Ovarian cyst    Parotid mass    left   Pneumonia    multiple times (03/25/2013)   Recurrent upper respiratory  infection (URI)    Urine, incontinence, stress female    Past Surgical History:  Procedure Laterality Date   ADENOIDECTOMY     AORTIC VALVE REPLACEMENT  09/03/2010   APPENDECTOMY     BLEPHAROPLASTY Bilateral ~ 1998   CARDIAC CATHETERIZATION N/A 07/06/2016   Procedure: Left Heart Cath and Cors/Grafts Angiography;  Surgeon: Ozell Fell, MD;  [facility] INVASIVE CV LAB;  Service: Cardiovascular;  Laterality: N/A;   CHOLECYSTECTOMY N/A 03/25/2013   Procedure: LAPAROSCOPIC CHOLECYSTECTOMY WITH INTRAOPERATIVE CHOLANGIOGRAM;  Surgeon: Donnice POUR. Belinda, MD;  Location: [facility] OR;  Service: General;  Laterality: N/A;   CORONARY ANGIOGRAPHY N/A 05/26/2018   Procedure: CORONARY ANGIOGRAPHY (CATH LAB);  Surgeon: Dann Candyce RAMAN, MD;  Location: [facility]VAS[facility] CV LAB;  Service: Cardiovascular;  Laterality: N/A;   CORONARY ANGIOPLASTY WITH STENT PLACEMENT  09/03/2008   I have 2 stents (03/25/2013)   NASAL SINUS SURGERY  1997; 1998   cust in maxillary sinus; put a stent in then removed it (03/25/2013)   ORIF ANKLE FRACTURE Right 02/14/2021   Procedure: OPEN TREATMENT OF RIGHT TRIMALLEOLAR ANKLE FRACTURE WITHOUT POSTERIOR FIXATION, SYNDESMOSIS;  Surgeon: Elsa Lonni SAUNDERS, MD;   Location: [facility];  Service: Orthopedics;  Laterality: Right;   OVARIAN CYST REMOVAL Right 09/03/2009   size of a soccer ball (03/25/2013)   PAROTIDECTOMY Left 04/28/2019   Procedure: LEFT TOTAL PAROTIDECTOMY;  Surgeon: Karis Clunes, MD;  Location: [facility];  Service: ENT;  Laterality: Left;   RIGHT HEART CATH AND CORONARY ANGIOGRAPHY N/A 07/23/2022   Procedure: RIGHT HEART CATH AND CORONARY ANGIOGRAPHY;  Surgeon: Verlin Lonni BIRCH, MD;  Location: [facility] INVASIVE CV LAB;  Service: Cardiovascular;  Laterality: N/A;   thrombosed hemorrohid lanced     TONSILLECTOMY AND ADENOIDECTOMY     TOTAL ABDOMINAL HYSTERECTOMY     Family History  Problem Relation Age of Onset   Heart disease Mother    COPD Father        smoked   Heart disease Father    Rheum arthritis Father    Allergic rhinitis Sister    Cirrhosis Sister    Stroke Brother    Heart disease Maternal Uncle    Breast cancer Daughter        currently 24   Breast cancer Daughter 73       currently 31; PALB2 mutation   Asthma Son        as a child    Coronary artery disease Other    Heart disease Other        6 stents   Cancer Other        very distant paternal cousin; pancreatic ca   Colon cancer Neg Hx    Colon polyps Neg Hx    Social History   Socioeconomic History   Marital status: Single    Spouse name: Not on file   Number of children: 3   Years of education: Not on file   Highest education level: Some college, no degree  Occupational History   Occupation: Holiday Representative: [employer]   Occupation: retired   Occupation: clinical biochemist  Tobacco Use   Smoking status: Former    Current packs/day: 0.00    Average packs/day: 1.5 packs/day for 44.0 years (66.0 ttl pk-yrs)    Types: Cigarettes    Start date: 09/03/1961    Quit date: 09/03/2005    Years since quitting: 18.8   Smokeless tobacco: Never  Vaping Use   Vaping  status: Never Used  Substance and Sexual Activity   Alcohol  use: Not Currently    Alcohol/week: 0.0 standard drinks of alcohol    Comment: social   Drug use: No   Sexual activity: Not Currently    Birth control/protection: Post-menopausal, Surgical  Other Topics Concern   Not on file  Social History Narrative   Lost fiancee of 22 years in 2022. 3 children (2 living). 6 grandkids. 2 greatgrandkids. Keeps greatgrandson on tuesdays- loves her so much.       Works part time at [employer] in [employer]   Retired from [place] (parttime- Dec 12)   Faith is very important to her      Hobbies: reading, cross stich, sew. Wants to learn to knit.       Social Drivers of Corporate Investment Banker Strain: Low Risk  (06/30/2024)   Overall Financial Resource Strain (CARDIA)    Difficulty of Paying Living Expenses: Not hard at all  Food Insecurity: No Food Insecurity (06/30/2024)   Hunger Vital Sign    Worried About Running Out of Food in the Last Year: Never true    Ran Out of Food in the Last Year: Never true  Transportation Needs: No Transportation Needs (06/30/2024)   PRAPARE - Administrator, Civil Service (Medical): No    Lack of Transportation (Non-Medical): No  Physical Activity: Sufficiently Active (06/30/2024)   Exercise Vital Sign    Days of Exercise per Week: 6 days    Minutes of Exercise per Session: 90 min  Stress: No Stress Concern Present (06/30/2024)   [employer] of Occupational Health - Occupational Stress Questionnaire    Feeling of Stress: Only a little  Social Connections: Moderately Integrated (06/30/2024)   Social Connection and Isolation Panel    Frequency of Communication with Friends and Family: More than three times a week    Frequency of Social Gatherings with Friends and Family: More than three times a week    Attends Religious Services: More than 4 times per year    Active Member of [employer] or Organizations: Yes    Attends Banker Meetings: 1 to 4 times per year    Marital Status: Never married     Tobacco Counseling Counseling given: Not Answered    Clinical Intake:  Pre-visit preparation completed: Yes  Pain : No/denies pain     BMI - recorded: 25 Nutritional Status: BMI 25 -29 Overweight Nutritional Risks: None Diabetes: Yes CBG done?: No Did pt. bring in CBG monitor from home?: No  Lab Results  Component Value Date   HGBA1C 6.5 01/06/2024   HGBA1C 6.5 05/28/2023   HGBA1C 6.2 (H) 12/14/2022     How often do you need to have someone help you when you read instructions, pamphlets, or other written materials from your doctor or pharmacy?: 1 - Never  Interpreter Needed?: No  Information entered by :: Ellouise Haws, LPN   Activities of Daily Living     06/30/2024    3:22 PM  In your present state of health, do you have any difficulty performing the following activities:  Hearing? 0  Vision? 0  Difficulty concentrating or making decisions? 0  Walking or climbing stairs? 0  Dressing or bathing? 0  Doing errands, shopping? 0  Preparing Food and eating ? N  Using the Toilet? N  In the past six months, have you accidently leaked urine? Y  Comment wears a pad  Do you have problems with  loss of bowel control? Y  Comment wears a pad  Managing your Medications? N  Managing your Finances? N  Housekeeping or managing your Housekeeping? N    Patient Care Team: Katrinka Garnette KIDD, MD as PCP - General (Family Medicine) Wonda Sharper, MD as PCP - Cardiology (Cardiology) Bensimhon, Toribio SAUNDERS, MD (Cardiology) Darlean Sharper NOVAK, MD as Consulting Physician (Pulmonary Disease) Delores Doughty, DC (Chiropractic Medicine) Karis Clunes, MD as Consulting Physician (Otolaryngology) Lelon Glendia ONEIDA DEVONNA as Physician Assistant (Cardiology) Nicholaus Sherlean CROME, [facility] (Inactive) (Pharmacist)  I have updated your Care Teams any recent Medical Services you may have received from other providers in the past year.     Assessment:   This is a routine wellness examination for  Katherine Foley.  Hearing/Vision screen Hearing Screening - Comments:: Pt denies any hearing issues  Vision Screening - Comments:: Wears rx glasses - not up to date with routine eye exams with Dr Cammie    Goals Addressed               This Visit's Progress     maintain health and activity as best as possible (pt-stated)         Depression Screen     06/30/2024    3:26 PM 01/06/2024    3:23 PM 09/20/2023    3:19 PM 09/02/2023   11:32 AM 06/10/2023    8:36 AM 05/28/2023   10:38 AM 03/08/2023    8:10 AM  PHQ 2/9 Scores  PHQ - 2 Score 2 0 2 6 2  0 6  PHQ- 9 Score 4 0 16 21 11  16     Fall Risk     06/30/2024    3:31 PM 01/09/2024    3:52 PM 09/20/2023    3:19 PM 09/02/2023   11:06 AM 06/10/2023    8:27 AM  Fall Risk   Falls in the past year? 1 0 1 0 1  Number falls in past yr: 1 0 0 0 1  Injury with Fall? 0 0 0 0 0  Risk for fall due to : History of fall(s);Impaired mobility No Fall Risks No Fall Risks No Fall Risks Impaired vision  Follow up Falls prevention discussed Falls evaluation completed Falls evaluation completed  Falls prevention discussed    MEDICARE RISK AT HOME:  Medicare Risk at Home Any stairs in or around the home?: Yes If so, are there any without handrails?: No Home free of loose throw rugs in walkways, pet beds, electrical cords, etc?: Yes Adequate lighting in your home to reduce risk of falls?: Yes Life alert?: No Use of a cane, walker or w/c?: No Grab bars in the bathroom?: Yes Shower chair or bench in shower?: No Elevated toilet seat or a handicapped toilet?: No  TIMED UP AND GO:  Was the test performed?  No  Cognitive Function: 6CIT completed        06/30/2024    3:32 PM 06/10/2023    8:41 AM 06/07/2022    8:28 AM 05/19/2021    1:51 PM 04/21/2020    9:50 AM  6CIT Screen  What Year? 0 points 0 points 0 points 0 points 0 points  What month? 0 points 0 points 0 points 0 points 0 points  What time? 0 points 0 points 0 points 0 points   Count back  from 20 0 points 0 points 0 points 0 points 0 points  Months in reverse 0 points 0 points 0 points 0 points 0 points  Repeat  phrase 0 points 0 points 0 points 0 points 0 points  Total Score 0 points 0 points 0 points 0 points     Immunizations Immunization History  Administered Date(s) Administered   Fluad Quad(high Dose 65+) 07/14/2020   INFLUENZA, HIGH DOSE SEASONAL PF 05/20/2015, 06/04/2019, 06/04/2019   Influenza Split 06/19/2011, 07/02/2012   Influenza Whole 08/03/2009   Influenza,inj,Quad PF,6+ Mos 05/21/2013, 05/14/2014, 06/17/2018   Influenza-Unspecified 08/17/2016, 06/19/2017, 06/17/2018   PFIZER(Purple Top)SARS-COV-2 Vaccination 10/20/2019, 11/09/2019, 06/30/2020   PNEUMOCOCCAL CONJUGATE-20 03/17/2024   Pneumococcal Conjugate-13 06/14/2014   Pneumococcal Polysaccharide-23 03/22/2016   Td 08/29/2009, 03/17/2020   Zoster Recombinant(Shingrix) 03/10/2018, 07/15/2018   Zoster, Live 01/21/2016    Screening Tests Health Maintenance  Topic Date Due   OPHTHALMOLOGY EXAM  Never done   Influenza Vaccine  04/03/2024   COVID-19 Vaccine (4 - 2025-26 season) 05/04/2024   HEMOGLOBIN A1C  07/08/2024   Diabetic kidney evaluation - Urine ACR  01/08/2025   Diabetic kidney evaluation - eGFR measurement  03/17/2025   FOOT EXAM  03/17/2025   Medicare Annual Wellness (AWV)  06/30/2025   Colonoscopy  12/27/2026   DTaP/Tdap/Td (3 - Tdap) 03/17/2030   Pneumococcal Vaccine: 50+ Years  Completed   DEXA SCAN  Completed   Hepatitis C Screening  Completed   Zoster Vaccines- Shingrix  Completed   Meningococcal B Vaccine  Aged Out   Mammogram  Discontinued    Health Maintenance Items Addressed: See Nurse Notes at the end of this note  Additional Screening:  Vision Screening: Recommended annual ophthalmology exams for early detection of glaucoma and other disorders of the eye. Is the patient up to date with their annual eye exam?  No  Who is the provider or what is the name of the office  in which the patient attends annual eye exams? Dr Cammie pt stated will make appt for exam   Dental Screening: Recommended annual dental exams for proper oral hygiene  Community Resource Referral / Chronic Care Management: CRR required this visit?  No   CCM required this visit?  No   Plan:    I have personally reviewed and noted the following in the patient's chart:   Medical and social history Use of alcohol, tobacco or illicit drugs  Current medications and supplements including opioid prescriptions. Patient is not currently taking opioid prescriptions. Functional ability and status Nutritional status Physical activity Advanced directives List of other physicians Hospitalizations, surgeries, and ER visits in previous 12 months Vitals Screenings to include cognitive, depression, and falls Referrals and appointments  In addition, I have reviewed and discussed with patient certain preventive protocols, quality metrics, and best practice recommendations. A written personalized care plan for preventive services as well as general preventive health recommendations were provided to patient.   Ellouise VEAR Haws, LPN   [id number]   After Visit Summary: (MyChart) Due to this being a telephonic visit, the after visit summary with patients personalized plan was offered to patient via MyChart   Notes: Nothing significant to report at this time.

## 2024-07-01 ENCOUNTER — Ambulatory Visit (HOSPITAL_COMMUNITY)
Admission: RE | Admit: 2024-07-01 | Discharge: 2024-07-01 | Disposition: A | Discharge status: Home / Self Care | Source: Ambulatory Visit | Attending: Cardiology | Admitting: Cardiology

## 2024-07-01 DIAGNOSIS — I350 Nonrheumatic aortic (valve) stenosis: Secondary | ICD-10-CM

## 2024-07-01 DIAGNOSIS — Z952 Presence of prosthetic heart valve: Secondary | ICD-10-CM | POA: Diagnosis not present

## 2024-07-01 LAB — ECHOCARDIOGRAM COMPLETE
AV Mean grad: 20 mmHg
AV Peak grad: 39.7 mmHg
Ao pk vel: 3.15 m/s
Area-P 1/2: 1.71 cm2
S' Lateral: 2.54 cm

## 2024-07-05 ENCOUNTER — Ambulatory Visit: Payer: Self-pay | Admitting: Cardiovascular Disease

## 2024-07-05 DIAGNOSIS — Z952 Presence of prosthetic heart valve: Secondary | ICD-10-CM

## 2024-07-07 DIAGNOSIS — K5710 Diverticulosis of small intestine without perforation or abscess without bleeding: Secondary | ICD-10-CM | POA: Diagnosis not present

## 2024-07-07 DIAGNOSIS — R310 Gross hematuria: Secondary | ICD-10-CM | POA: Diagnosis not present

## 2024-07-07 DIAGNOSIS — N200 Calculus of kidney: Secondary | ICD-10-CM | POA: Diagnosis not present

## 2024-07-07 DIAGNOSIS — N281 Cyst of kidney, acquired: Secondary | ICD-10-CM | POA: Diagnosis not present

## 2024-07-08 DIAGNOSIS — F4381 Prolonged grief disorder: Secondary | ICD-10-CM | POA: Diagnosis not present

## 2024-07-16 ENCOUNTER — Ambulatory Visit

## 2024-07-23 ENCOUNTER — Other Ambulatory Visit: Payer: Self-pay | Admitting: Family Medicine

## 2024-07-29 DIAGNOSIS — R310 Gross hematuria: Secondary | ICD-10-CM | POA: Diagnosis not present

## 2024-07-29 DIAGNOSIS — N201 Calculus of ureter: Secondary | ICD-10-CM | POA: Diagnosis not present

## 2024-08-21 ENCOUNTER — Other Ambulatory Visit: Payer: Self-pay | Admitting: Family Medicine

## 2024-08-25 ENCOUNTER — Telehealth: Payer: Self-pay | Admitting: Family Medicine

## 2024-08-25 NOTE — Telephone Encounter (Signed)
 Lvm to get pt sche to be seen for medication refill transfer to front office. Thanks!

## 2024-08-25 NOTE — Telephone Encounter (Signed)
 Please call patient to schedule follow up this will be last refill we can give her until she comes in to be seen.   Thanks!!!!

## 2024-08-28 NOTE — Telephone Encounter (Signed)
 08/25/2024-Lvm to get pt sche to be seen for medication refill transfer to front office. Thanks! 08/28/2024- pt has been sche //aw

## 2024-09-15 ENCOUNTER — Encounter: Payer: Self-pay | Admitting: Family Medicine

## 2024-09-15 ENCOUNTER — Ambulatory Visit: Payer: Self-pay | Admitting: Family Medicine

## 2024-09-15 ENCOUNTER — Ambulatory Visit: Admitting: Family Medicine

## 2024-09-15 VITALS — BP 122/68 | HR 67 | Temp 97.6°F | Ht 60.0 in | Wt 127.2 lb

## 2024-09-15 DIAGNOSIS — I10 Essential (primary) hypertension: Secondary | ICD-10-CM | POA: Diagnosis not present

## 2024-09-15 DIAGNOSIS — F3341 Major depressive disorder, recurrent, in partial remission: Secondary | ICD-10-CM | POA: Diagnosis not present

## 2024-09-15 DIAGNOSIS — R5383 Other fatigue: Secondary | ICD-10-CM | POA: Diagnosis not present

## 2024-09-15 DIAGNOSIS — E119 Type 2 diabetes mellitus without complications: Secondary | ICD-10-CM | POA: Diagnosis not present

## 2024-09-15 DIAGNOSIS — J449 Chronic obstructive pulmonary disease, unspecified: Secondary | ICD-10-CM

## 2024-09-15 DIAGNOSIS — E785 Hyperlipidemia, unspecified: Secondary | ICD-10-CM

## 2024-09-15 LAB — CBC WITH DIFFERENTIAL/PLATELET
Basophils Absolute: 0 K/uL (ref 0.0–0.1)
Basophils Relative: 0.8 % (ref 0.0–3.0)
Eosinophils Absolute: 0 K/uL (ref 0.0–0.7)
Eosinophils Relative: 1 % (ref 0.0–5.0)
HCT: 36.5 % (ref 36.0–46.0)
Hemoglobin: 11.8 g/dL — ABNORMAL LOW (ref 12.0–15.0)
Lymphocytes Relative: 16 % (ref 12.0–46.0)
Lymphs Abs: 0.6 K/uL — ABNORMAL LOW (ref 0.7–4.0)
MCHC: 32.3 g/dL (ref 30.0–36.0)
MCV: 90.4 fl (ref 78.0–100.0)
Monocytes Absolute: 0.5 K/uL (ref 0.1–1.0)
Monocytes Relative: 13 % — ABNORMAL HIGH (ref 3.0–12.0)
Neutro Abs: 2.8 K/uL (ref 1.4–7.7)
Neutrophils Relative %: 69.2 % (ref 43.0–77.0)
Platelets: 163 K/uL (ref 150.0–400.0)
RBC: 4.04 Mil/uL (ref 3.87–5.11)
RDW: 14.5 % (ref 11.5–15.5)
WBC: 4 K/uL (ref 4.0–10.5)

## 2024-09-15 LAB — COMPREHENSIVE METABOLIC PANEL WITH GFR
ALT: 19 U/L (ref 3–35)
AST: 24 U/L (ref 5–37)
Albumin: 4 g/dL (ref 3.5–5.2)
Alkaline Phosphatase: 57 U/L (ref 39–117)
BUN: 11 mg/dL (ref 6–23)
CO2: 29 meq/L (ref 19–32)
Calcium: 8.6 mg/dL (ref 8.4–10.5)
Chloride: 102 meq/L (ref 96–112)
Creatinine, Ser: 0.61 mg/dL (ref 0.40–1.20)
GFR: 87.45 mL/min
Glucose, Bld: 100 mg/dL — ABNORMAL HIGH (ref 70–99)
Potassium: 4.3 meq/L (ref 3.5–5.1)
Sodium: 138 meq/L (ref 135–145)
Total Bilirubin: 0.3 mg/dL (ref 0.2–1.2)
Total Protein: 6.1 g/dL (ref 6.0–8.3)

## 2024-09-15 LAB — MICROALBUMIN / CREATININE URINE RATIO
Creatinine,U: 46.4 mg/dL
Microalb Creat Ratio: 36.6 mg/g — ABNORMAL HIGH (ref 0.0–30.0)
Microalb, Ur: 1.7 mg/dL (ref 0.7–1.9)

## 2024-09-15 LAB — HEMOGLOBIN A1C: Hgb A1c MFr Bld: 6.4 % (ref 4.6–6.5)

## 2024-09-15 LAB — TSH: TSH: 0.6 u[IU]/mL (ref 0.35–5.50)

## 2024-09-15 MED ORDER — LORAZEPAM 0.5 MG PO TABS: ORAL_TABLET | ORAL | 5 refills | Status: AC

## 2024-09-15 NOTE — Progress Notes (Signed)
 " Phone [phone] In person visit   Subjective:   Katherine Foley is a 74 y.o. 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    Priority: High   Depression 09/22/2015    Priority: High   Former smoker 09/22/2015    Priority: High   S/P AVR (aortic valve replacement) 11/06/2013    Priority: High   Aortic stenosis 07/07/2010    Priority: High   Coronary artery disease involving native coronary artery with angina pectoris 05/17/2010    Priority: High   COPD  GOLD 2 07/23/2007    Priority: High   Aortic atherosclerosis 03/10/2020    Priority: Medium    Restless legs 08/25/2019    Priority: Medium    Hot flashes 03/22/2016    Priority: Medium    Diastolic dysfunction 05/31/2015    Priority: Medium    GERD (gastroesophageal reflux disease) 06/04/2014    Priority: Medium    Carotid stenosis 05/17/2010    Priority: Medium    HLD (hyperlipidemia) 08/29/2009    Priority: Medium    Essential hypertension 04/14/2007    Priority: Medium    Osteopenia 04/14/2007    Priority: Medium    Genetic testing 02/10/2018    Priority: Low   Family history of breast cancer     Priority: Low   Allergic rhinitis 09/22/2015    Priority: Low   Adenomatous colon polyp     Priority: Low   Herpes simplex virus (HSV) infection 01/16/2010    Priority: Low   Acute recurrent maxillary sinusitis 04/04/2024   Deviated nasal septum 09/22/2023   Hypertrophy of nasal turbinates 09/22/2023   Postnasal drip 09/22/2023   Recurrent major depressive disorder, in partial remission 09/06/2022   Influenza A 08/28/2022   COVID-19 07/31/2022   Hypokalemia  07/31/2022   Anemia 07/31/2022   Coronary artery disease involving native coronary artery of native heart without angina pectoris 07/23/2022   Precordial chest pain 11/06/2013    Medications- reviewed and updated Current Outpatient Medications  Medication Sig Dispense Refill   acetaminophen  (TYLENOL ) 650 MG CR tablet Take 1,300 mg by mouth every 8 (eight) hours as needed for pain.     albuterol  (VENTOLIN  HFA) 108 (90 Base) MCG/ACT inhaler Inhale 2 puffs into the lungs every 6 (six) hours as needed for wheezing or shortness of breath. 8 g 3   amLODipine  (NORVASC ) 2.5 MG tablet TAKE 1 TABLET BY MOUTH ONCE DAILY 90 tablet 3   aspirin  EC 81 MG tablet Take 1 tablet (81 mg total) by mouth daily.     azelastine  (ASTELIN ) 0.1 % nasal spray Place 2 sprays into both nostrils 2 (two) times daily. Use in each nostril as directed 30 mL 12   Budeson-Glycopyrrol-Formoterol  (BREZTRI  AEROSPHERE) 160-9-4.8 MCG/ACT AERO Inhale 2 puffs into the lungs in the morning and at bedtime. 1 each 11   buPROPion  (WELLBUTRIN  XL) 300 MG 24 hr tablet TAKE 1 TABLET BY MOUTH ONCE DAILY 30 tablet 11   Calcium  Carb-Cholecalciferol (CALCIUM  500/D PO) Take 2 tablets by mouth daily. Mag and zinc added     desvenlafaxine  (PRISTIQ ) 100 MG 24 hr tablet TAKE 1  TABLET BY MOUTH EVERY MORNING 30 tablet 11   dexlansoprazole  (DEXILANT ) 60 MG capsule TAKE 1 CAPSULE BY MOUTH DAILY 90 capsule 11   ezetimibe  (ZETIA ) 10 MG tablet TAKE 1 TABLET BY MOUTH ONCE DAILY 90 tablet 3   fluticasone  (CUTIVATE ) 0.05 % cream Apply 1 Application topically 2 (two) times daily as needed (eczema).     gabapentin  (NEURONTIN ) 300 MG capsule TAKE ONE (1) CAPSULE BY MOUTH EVERY DAY AT BEDTIME 30 capsule 11   ipratropium (ATROVENT ) 0.06 % nasal spray Place 2 sprays into both nostrils 2 (two) times daily as needed for rhinitis (drainage). 15 mL 10   ipratropium-albuterol  (DUONEB) 0.5-2.5 (3) MG/3ML SOLN Take 3 mLs by nebulization every 6 (six) hours as needed. 360 mL 4    isosorbide  mononitrate (IMDUR ) 30 MG 24 hr tablet TAKE 0.5 TABLETS (15 MG TOTAL) BY MOUTH DAILY. PT NEEDS TO KEEP UPCOMING APPT IN OCT FOR FURTHER REFILLS 30 tablet 11   LORazepam  (ATIVAN ) 0.5 MG tablet TAKE 1 TABLET BY MOUTH TWICE DAILY AS NEEDED FOR ANXIETY *DO NOT DRIVE FOR 8 HOURS AFTER USING**MAY FILL MONTHLY* 60 tablet 5   meloxicam (MOBIC) 7.5 MG tablet Take 7.5 mg by mouth daily.     Multiple Vitamins-Minerals (MULTIVITAMIN ADULT PO) Take 1 tablet by mouth daily.     nitroGLYCERIN  (NITROSTAT ) 0.4 MG SL tablet Place 1 tablet (0.4 mg total) under the tongue every 5 (five) minutes as needed for chest pain (if pain does not resolve with 2 doses, call 911). 30 tablet 5   rosuvastatin  (CRESTOR ) 40 MG tablet TAKE 1 TABLET BY MOUTH ONCE DAILY 30 tablet 11   valACYclovir  (VALTREX ) 1000 MG tablet TAKE 2 TABLETS BY MOUTH TWICE DAILY FOR 1 DAY AT FIRST SIGN OF COLD SORE *REFILL REQUEST* 30 tablet 10   No current facility-administered medications for this visit.     Objective:  BP 122/68 (BP Location: Left Arm, Patient Position: Sitting, Cuff Size: Normal)   Pulse 67   Temp 97.6 F (36.4 C) (Temporal)   Ht 5' (1.524 m)   Wt 127 lb 3.2 oz (57.7 kg)   SpO2 94%   BMI 24.84 kg/m  Gen: NAD, resting comfortably CV: RRR no murmurs rubs or gallops Lungs: CTAB no crackles, wheeze, rhonchi Ext: trace edema Skin: warm, dry    Assessment and Plan   # Gross hematuria-saw urology 07/29/2024 most recently-cystoscopy reassuring.  CT showed kidney stones.  They recommend repeat evaluation annually with me and I discussed repeating evaluation with urology if persistent in 2 to 5 years with blood in urine  # New diabetes by May 2025 S:  Medication: none Lab Results  Component Value Date   HGBA1C 6.5 01/06/2024   HGBA1C 6.5 05/28/2023   HGBA1C 6.2 (H) 12/14/2022   A/P: hopefully stable- update a1c today. Continue without meds for now . Check uacr as well  #CAD-Status post prior PCI to the RCA and  RI and subsequent CABG in 2012.  Cardiac catheterization in 2019 demonstrated patent stents in the RCA and RI, atretic LIMA-LAD and mild nonobstructive disease in the LAD.  -Also had cath on 07/23/2022-plan was medical management #hyperlipidemia  S: Medication:Aspirin  81 mg, Zetia  10 mg, rosuvastatin  40 mg, Imdur  30 mg -stable shortness of breath but no chest pain   Lab Results  Component Value Date   CHOL 121 03/17/2024   HDL 67.20 03/17/2024   LDLCALC 44 03/17/2024   LDLDIRECT 39 07/01/2023   TRIG 52.0 03/17/2024   CHOLHDL 2  03/17/2024  A/P: CAD-largely asymptomatic continue current medications - particularly imdur  as controlling any anginal pains Hyperlipidemia-great control last visit- continue current medications    % Aortic valve disease status post aortic valve replacement-follows with cardiology including echocardiograms and has SBE prophylaxis  -Last echocardiogram October 2025- reassuring  #hypertension S: medication: Amlodipine  2.5 mg, Imdur  30 mg BP Readings from Last 3 Encounters:  09/15/24 122/68  06/09/24 120/60  04/09/24 128/64  A/P: stable- continue current medicines   # Depression  # Anxiety S:Medication: Pristiq  100 mg, Wellbutrin  300 mg extended release, lorazepam  0.5 mg twice daily as needed- needs refill   -Contributing factors-also fianc of 22 years at age 86 in 2020 and previously lost her best friend/daughter Angie in 2019    09/15/2024    9:18 AM 06/30/2024    3:26 PM 01/06/2024    3:23 PM  Depression screen PHQ 2/9  Decreased Interest 1 1 0  Down, Depressed, Hopeless 3 1 0  PHQ - 2 Score 4 2 0  Altered sleeping 1 1 0  Tired, decreased energy 1 1 0  Change in appetite 1 0 0  Feeling bad or failure about yourself  2 0 0  Trouble concentrating 2 0 0  Moving slowly or fidgety/restless 0 0 0  Suicidal thoughts 2 0 0  PHQ-9 Score 13 4  0   Difficult doing work/chores Extremely dIfficult Somewhat difficult Not difficult at all     Data saved with a  previous flowsheet row definition  A/P: poor control- having thoughts of self harm- aging process has been really hard on her plus prior losses particularly hard with holidays. Some temporary thoughts of self harm- that things would be easier if not here but no plan.  -discussed psychiatry for treatment changes - shed like to think this over and let me know if she wants to move forward but for now continue current medications  -still in grief counseling which is a great step- would call kimberly if progressive as well as 911 -back pain also a trigger- seeing orthopedics  #COPD/former smoker-saw Dr. Alaine S: Medication:breztri  -Significant hospitalization December 2023 with influenza A causing respiratory failure A/P: reasonably stable- continue current medications     #Hot flashes- on gabapentin  300mg  at bedtime- also helps with restless legs some   # GERD S: Medication: Dexilant  60 mg  A/P: reasonable control- continue current medications   #parotid gland tumor history and removal- sees Dr. Karis soon. Also no n allergenic rhinitis  Recommended follow up: No follow-ups on file. Future Appointments  Date Time Provider Department Center  10/01/2024  1:40 PM Karis Clunes, MD [facility] None    Lab/Order associations:   ICD-10-CM   1. Diabetes mellitus without complication (HCC)  E11.9     2. COPD mixed type (HCC)  J44.9     3. Hyperlipidemia, unspecified hyperlipidemia type  E78.5     4. Fatigue, unspecified type  R53.83     5. Recurrent major depressive disorder, in partial remission  F33.41       No orders of the defined types were placed in this encounter.   Return precautions advised.  Garnette Lukes, MD  "

## 2024-09-15 NOTE — Patient Instructions (Addendum)
 Discussed psychiatry for treatment changes - shed like to think this over and let me know if she wants to move forward but for now continue current medications   Flu shot today  I am sorry you are not feeling well- if progressive thoughts of self harm call 911 and Katherine Foley but hoping some of this will improve further out from holidays  Please stop by lab before you go If you have mychart- we will send your results within 3 business days of us  receiving them.  If you do not have mychart- we will call you about results within 5 business days of us  receiving them.  *please also note that you will see labs on mychart as soon as they post. I will later go in and write notes on them- will say notes from Katherine Foley   Recommended follow up: Return in about 6 months (around 03/15/2025) for physical or sooner if needed.Schedule b4 you leave.

## 2024-09-16 ENCOUNTER — Other Ambulatory Visit: Payer: Self-pay | Admitting: Family Medicine

## 2024-09-16 MED ORDER — VALSARTAN 40 MG PO TABS: 40.0000 mg | ORAL_TABLET | Freq: Every day | ORAL | 11 refills | Status: AC

## 2024-09-16 NOTE — Telephone Encounter (Signed)
 Medication sent

## 2024-09-25 ENCOUNTER — Telehealth: Payer: Self-pay | Admitting: Family Medicine

## 2024-09-25 DIAGNOSIS — I10 Essential (primary) hypertension: Secondary | ICD-10-CM

## 2024-09-25 NOTE — Telephone Encounter (Signed)
 Copied from CRM #[id number]. Topic: Clinical - Medical Advice >> Sep 25, 2024 10:34 AM Laymon HERO wrote: Reason for CRM: Patient was told to come back  to recheck BMP at her hypertension in 2 weeks. Please reach out to patient for orders or what she needs to have done

## 2024-09-29 ENCOUNTER — Ambulatory Visit: Payer: Self-pay

## 2024-09-29 ENCOUNTER — Ambulatory Visit: Admitting: Physician Assistant

## 2024-09-29 VITALS — BP 122/60 | HR 70 | Temp 99.7°F | Ht 60.0 in | Wt 127.0 lb

## 2024-09-29 DIAGNOSIS — J0110 Acute frontal sinusitis, unspecified: Secondary | ICD-10-CM

## 2024-09-29 DIAGNOSIS — U071 COVID-19: Secondary | ICD-10-CM

## 2024-09-29 DIAGNOSIS — J449 Chronic obstructive pulmonary disease, unspecified: Secondary | ICD-10-CM

## 2024-09-29 LAB — POC INFLUENZA A&B (BINAX/QUICKVUE)
Influenza A, POC: NEGATIVE
Influenza B, POC: NEGATIVE

## 2024-09-29 LAB — POC COVID19 BINAXNOW: SARS Coronavirus 2 Ag: POSITIVE — AB

## 2024-09-29 MED ORDER — ALBUTEROL SULFATE HFA 108 (90 BASE) MCG/ACT IN AERS: 2.0000 | INHALATION_SPRAY | Freq: Four times a day (QID) | RESPIRATORY_TRACT | 0 refills | Status: AC | PRN

## 2024-09-29 MED ORDER — AZITHROMYCIN 250 MG PO TABS: ORAL_TABLET | ORAL | 0 refills | Status: AC

## 2024-09-29 MED ORDER — PREDNISONE 20 MG PO TABS: 20.0000 mg | ORAL_TABLET | Freq: Two times a day (BID) | ORAL | 0 refills | Status: AC

## 2024-09-29 NOTE — Progress Notes (Signed)
 "  History of Present Illness:   Chief Complaint  Patient presents with   Cough    Pt c/o cough, head and chest congestion, coughing, expectorating yellow sputum x 2 days, nausea since last night, chills. COVID test positive last night.    Discussed the use of AI scribe software for clinical note transcription with the patient, who gave verbal consent to proceed.  History of Present Illness   Katherine Foley is a 74 year old female with COPD who presents with sinus problems and a recent COVID infection.  She has persistent sinus symptoms since her recent COVID infection, including sinus soreness and a distorted smell described as fresh paint. She uses a neti pot and two nasal sprays for rhinitis with limited relief.  She has COPD and uses Breztri  daily. She owns an albuterol  inhaler but has not used it despite some shortness of breath. Her home oxygen saturation is usually 94% by pulse oximeter.  She had COVID and strep throat previously and was hospitalized with the flu at Christmas. She was treated with molnupiravir  instead of Paxlovid during a prior COVID episode.  Her worst current symptom is headache with low-grade fevers. She started Mucinex  on Tuesday.  She works at [employer] and is concerned about when she can return to work, especially because she has difficulty breathing with a mask. She denies severe sore throat or ear pain.       Past Medical History:  Diagnosis Date   Adenomatous colon polyp    Angio-edema    Anxiety    Anxiety/Depression 02/15/2009   Aortic stenosis s/p bioprosthetic AVR 07/07/2010   Echo 9/19:  Mild conc LVH, EF 60-65, no RWMA, Gr 1 DD, AVR ok (mean 10, peak 21), trivial MR, normal RVSF, mild TR // Echo 10/22: EF 60-65, no RWMA, normal RVSF, AVR with increased gradients (mean 24 increased from 10 in 2019) // Echocardiogram 10/23: EF 60-65, no RWMA, mildly reduced RVSF, AVR w mean gradient 17.3 mmHg   Asthma    Barrett's esophagus    04/2007;  2000   Carotid stenosis 05/17/2010   a. dopplers 11/11: RICA 40-50%; LICA 0-39% (follow up due 11/12)   COPD 07/23/2007   Coronary Artery Disease 05/17/2010   NSTEMI 8/11 - BMS to RCA;  cath 4/12: dLAD 50%, RI 80% (PCI), AVCFX 30%, pRCA 30%, stent ok, 40-50, then 50% dist RCA;  c. s/p Promus DES to RI 12/25/10;  d. 04/2011 CABG x 1 (LIMA->LAD) @ time of AVR // Cath in 9/19: RI and RCA stents ok; L-LAD atretic, mild disease in mLAD >> med Rx   Eczema    Emphysema of lung (HCC)    Family history of breast cancer    2 daughters; PALB2 mutation in family   Genetic testing 02/10/2018   PALB2 analysis at [facility] - Negative for familial mutation   GERD    H/O hiatal hernia    Heart murmur    Hemorrhoid thrombosis    had it lanced (03/25/2013)   Herpes simplex without mention of complication 01/16/2010   History of blood transfusion    'w/OHS (03/25/2013)   Hyperlipidemia 08/29/2009   Hypertension 04/14/2007   Migraine HAs 04/20/2008   Myocardial infarction (HCC) 2011   during catherization (03/25/2013)   OSTEOPENIA 04/14/2007   Ovarian cyst    Parotid mass    left   Pneumonia    multiple times (03/25/2013)   Recurrent upper respiratory infection (URI)    Urine, incontinence, stress female  Social History[1]  Past Surgical History:  Procedure Laterality Date   ADENOIDECTOMY     AORTIC VALVE REPLACEMENT  09/03/2010   APPENDECTOMY     BLEPHAROPLASTY Bilateral ~ 1998   CARDIAC CATHETERIZATION N/A 07/06/2016   Procedure: Left Heart Cath and Cors/Grafts Angiography;  Surgeon: Ozell Fell, MD;  Location: [facility] INVASIVE CV LAB;  Service: Cardiovascular;  Laterality: N/A;   CARDIAC VALVE REPLACEMENT     CHOLECYSTECTOMY N/A 03/25/2013   Procedure: LAPAROSCOPIC CHOLECYSTECTOMY WITH INTRAOPERATIVE CHOLANGIOGRAM;  Surgeon: Donnice POUR. Belinda, MD;  Location: [facility] OR;  Service: General;  Laterality: N/A;   CORONARY ANGIOGRAPHY N/A 05/26/2018   Procedure: CORONARY ANGIOGRAPHY (CATH LAB);   Surgeon: Dann Candyce RAMAN, MD;  Location: [facility] INVAS[facility] CV LAB;  Service: Cardiovascular;  Laterality: N/A;   CORONARY ANGIOPLASTY WITH STENT PLACEMENT  09/03/2008   I have 2 stents (03/25/2013)   CORONARY ARTERY BYPASS GRAFT     COSMETIC SURGERY     FRACTURE SURGERY     NASAL SINUS SURGERY  1997; 1998   cust in maxillary sinus; put a stent in then removed it (03/25/2013)   ORIF ANKLE FRACTURE Right 02/14/2021   Procedure: OPEN TREATMENT OF RIGHT TRIMALLEOLAR ANKLE FRACTURE WITHOUT POSTERIOR FIXATION, SYNDESMOSIS;  Surgeon: Elsa Lonni SAUNDERS, MD;  Location: [facility];  Service: Orthopedics;  Laterality: Right;   OVARIAN CYST REMOVAL Right 09/03/2009   size of a soccer ball (03/25/2013)   PAROTIDECTOMY Left 04/28/2019   Procedure: LEFT TOTAL PAROTIDECTOMY;  Surgeon: Karis Clunes, MD;  Location:  SURGERY CENTER;  Service: ENT;  Laterality: Left;   RIGHT HEART CATH AND CORONARY ANGIOGRAPHY N/A 07/23/2022   Procedure: RIGHT HEART CATH AND CORONARY ANGIOGRAPHY;  Surgeon: Verlin Lonni BIRCH, MD;  Location: [facility] INVASIVE CV LAB;  Service: Cardiovascular;  Laterality: N/A;   thrombosed hemorrohid lanced     TONSILLECTOMY AND ADENOIDECTOMY     TOTAL ABDOMINAL HYSTERECTOMY      Family History  Problem Relation Age of Onset   Heart disease Mother    Hypertension Mother    COPD Father        smoked   Heart disease Father    Rheum arthritis Father    Allergic rhinitis Sister    Cirrhosis Sister    Stroke Brother    Heart disease Maternal Uncle    Breast cancer Daughter        currently 42   Breast cancer Daughter 51       currently 16; PALB2 mutation   Asthma Son        as a child    Hypertension Son    Coronary artery disease Other    Heart disease Other        6 stents   Cancer Other        very distant paternal cousin; pancreatic ca   Colon cancer Neg Hx    Colon polyps Neg Hx     Allergies[2]  Current Medications:  Current Medications[3]    Review of Systems:   Negative unless otherwise specified per HPI.  Vitals:   Vitals:   09/29/24 1128  BP: 122/60  Pulse: 70  Temp: 99.7 F (37.6 C)  TempSrc: Temporal  SpO2: 94%  Weight: 127 lb (57.6 kg)  Height: 5' (1.524 m)     Body mass index is 24.8 kg/m.  Physical Exam:   Physical Exam Vitals and nursing note reviewed.  Constitutional:      General: She is not in acute distress.  Appearance: She is well-developed. She is not ill-appearing or toxic-appearing.  Cardiovascular:     Rate and Rhythm: Normal rate and regular rhythm.     Pulses: Normal pulses.     Heart sounds: Normal heart sounds, S1 normal and S2 normal.  Pulmonary:     Effort: Pulmonary effort is normal.     Breath sounds: Normal breath sounds.  Skin:    General: Skin is warm and dry.  Neurological:     Mental Status: She is alert.     GCS: GCS eye subscore is 4. GCS verbal subscore is 5. GCS motor subscore is 6.  Psychiatric:        Speech: Speech normal.        Behavior: Behavior normal. Behavior is cooperative.     Assessment and Plan:   Assessment and Plan    COVID-19 infection Current infection with anosmia and headache. Risk of COPD exacerbation or pneumonia. No recent vaccination. Negative flu test. - Prescribed prednisone  to prevent COPD exacerbation. - Advised to monitor oxygen saturation at home; seek emergency care if saturation drops below 90% with shortness of breath. - Advised to stay home from work until Monday due to COPD and contagious nature of illness. - Provided work note for absence until Monday.  Chronic obstructive pulmonary disease COPD well-managed with daily Breztri  inhaler. Baseline oxygen saturation 94%. - Prescribed new albuterol  inhaler. - Continue Breztri  inhaler daily. - Monitor oxygen saturation at home.  Acute non-recurrent frontal sinusitis  Sore nasal passages and headache. Using neti pot and nasal sprays. Mucinex  started. - Continue  Mucinex . - Prescribed antibiotic for potential worsening of sinus symptoms or COPD exacerbation.      Lucie Buttner, PA-C    [1]  Social History Tobacco Use   Smoking status: Former    Current packs/day: 0.00    Average packs/day: 1.5 packs/day for 44.0 years (66.0 ttl pk-yrs)    Types: Cigarettes    Start date: 09/03/1961    Quit date: 09/03/2005    Years since quitting: 19.0   Smokeless tobacco: Never  Vaping Use   Vaping status: Never Used  Substance Use Topics   Alcohol use: Not Currently    Alcohol/week: 0.0 standard drinks of alcohol    Comment: social   Drug use: No  [2]  Allergies Allergen Reactions   Codeine Sulfate Itching and Rash   Hydrocodone-Acetaminophen  Itching and Rash   Augmentin  [Amoxicillin -Pot Clavulanate] Rash  [3]  Current Outpatient Medications:    acetaminophen  (TYLENOL ) 650 MG CR tablet, Take 1,300 mg by mouth every 8 (eight) hours as needed for pain., Disp: , Rfl:    albuterol  (VENTOLIN  HFA) 108 (90 Base) MCG/ACT inhaler, Inhale 2 puffs into the lungs every 6 (six) hours as needed for wheezing or shortness of breath., Disp: 8 g, Rfl: 3   albuterol  (VENTOLIN  HFA) 108 (90 Base) MCG/ACT inhaler, Inhale 2 puffs into the lungs every 6 (six) hours as needed for wheezing or shortness of breath., Disp: 8 g, Rfl: 0   amLODipine  (NORVASC ) 2.5 MG tablet, TAKE 1 TABLET BY MOUTH ONCE DAILY, Disp: 90 tablet, Rfl: 3   aspirin  EC 81 MG tablet, Take 1 tablet (81 mg total) by mouth daily., Disp:  , Rfl:    azelastine  (ASTELIN ) 0.1 % nasal spray, Place 2 sprays into both nostrils 2 (two) times daily. Use in each nostril as directed, Disp: 30 mL, Rfl: 12   azithromycin  (ZITHROMAX ) 250 MG tablet, Take 2 tablets on day 1, then 1  tablet daily on days 2 through 5, Disp: 6 tablet, Rfl: 0   Budeson-Glycopyrrol-Formoterol  (BREZTRI  AEROSPHERE) 160-9-4.8 MCG/ACT AERO, Inhale 2 puffs into the lungs in the morning and at bedtime., Disp: 1 each, Rfl: 11   buPROPion  (WELLBUTRIN  XL)  300 MG 24 hr tablet, TAKE 1 TABLET BY MOUTH ONCE DAILY, Disp: 30 tablet, Rfl: 11   Calcium  Carb-Cholecalciferol (CALCIUM  500/D PO), Take 2 tablets by mouth daily. Mag and zinc added, Disp: , Rfl:    desvenlafaxine  (PRISTIQ ) 100 MG 24 hr tablet, TAKE 1 TABLET BY MOUTH EVERY MORNING, Disp: 30 tablet, Rfl: 11   dexlansoprazole  (DEXILANT ) 60 MG capsule, TAKE 1 CAPSULE BY MOUTH DAILY, Disp: 90 capsule, Rfl: 11   ezetimibe  (ZETIA ) 10 MG tablet, TAKE 1 TABLET BY MOUTH ONCE DAILY, Disp: 90 tablet, Rfl: 3   fluticasone  (CUTIVATE ) 0.05 % cream, Apply 1 Application topically 2 (two) times daily as needed (eczema)., Disp: , Rfl:    gabapentin  (NEURONTIN ) 300 MG capsule, TAKE ONE (1) CAPSULE BY MOUTH EVERY DAY AT BEDTIME, Disp: 30 capsule, Rfl: 11   ipratropium (ATROVENT ) 0.06 % nasal spray, Place 2 sprays into both nostrils 2 (two) times daily as needed for rhinitis (drainage)., Disp: 15 mL, Rfl: 10   ipratropium-albuterol  (DUONEB) 0.5-2.5 (3) MG/3ML SOLN, Take 3 mLs by nebulization every 6 (six) hours as needed., Disp: 360 mL, Rfl: 4   isosorbide  mononitrate (IMDUR ) 30 MG 24 hr tablet, TAKE 0.5 TABLETS (15 MG TOTAL) BY MOUTH DAILY. PT NEEDS TO KEEP UPCOMING APPT IN OCT FOR FURTHER REFILLS, Disp: 30 tablet, Rfl: 11   LORazepam  (ATIVAN ) 0.5 MG tablet, TAKE 1 TABLET BY MOUTH TWICE DAILY AS NEEDED FOR ANXIETY *DO NOT DRIVE FOR 8 HOURS AFTER USING**MAY FILL MONTHLY*, Disp: 60 tablet, Rfl: 5   meloxicam (MOBIC) 15 MG tablet, Take 15 mg by mouth daily., Disp: , Rfl:    Multiple Vitamins-Minerals (MULTIVITAMIN ADULT PO), Take 1 tablet by mouth daily., Disp: , Rfl:    nitroGLYCERIN  (NITROSTAT ) 0.4 MG SL tablet, Place 1 tablet (0.4 mg total) under the tongue every 5 (five) minutes as needed for chest pain (if pain does not resolve with 2 doses, call 911)., Disp: 30 tablet, Rfl: 5   predniSONE  (DELTASONE ) 20 MG tablet, Take 1 tablet (20 mg total) by mouth 2 (two) times daily with a meal., Disp: 10 tablet, Rfl: 0    rosuvastatin  (CRESTOR ) 40 MG tablet, TAKE 1 TABLET BY MOUTH ONCE DAILY, Disp: 30 tablet, Rfl: 11   valACYclovir  (VALTREX ) 1000 MG tablet, TAKE 2 TABLETS BY MOUTH TWICE DAILY FOR 1 DAY AT FIRST SIGN OF COLD SORE *REFILL REQUEST*, Disp: 30 tablet, Rfl: 10   valsartan  (DIOVAN ) 40 MG tablet, Take 1 tablet (40 mg total) by mouth daily., Disp: 30 tablet, Rfl: 11  "

## 2024-09-29 NOTE — Telephone Encounter (Signed)
 FYI Only or Action Required?: FYI only for provider: appointment scheduled on 1/27.  Patient was last seen in primary care on 09/15/2024 by Katrinka Garnette KIDD, MD.  Called Nurse Triage reporting Sinusitis and Cough.  Symptoms began several days ago.  Interventions attempted: OTC medications: tylenol , mucinex  and Rest, hydration, or home remedies.  Symptoms are: gradually worsening.  Triage Disposition: See Physician Within 24 Hours  Patient/caregiver understands and will follow disposition?: Yes  Message from [facility] C sent at 09/29/2024  8:18 AM EST  Reason for Triage: Patient has head and chest congestion started a couple days ago, along with headaches and cough states it's been getting worse, takes mucinex  has helped some but not much   Reason for Disposition  Fever present > 3 days (72 hours)  Answer Assessment - Initial Assessment Questions Symptoms for the last few days: SOB but has COPD- worse with activity or coughing - 2 inhalers- one daily, rescue- hasn't needed rescue- wheezing but daily inhaler helping her Head and chest congestion- sinus headache and all over head Cough- yellow phlegm  Runny nose-yellow mucous-- Netti Pot Low grade fever- body aches Mucinex , tylenol  arthritis Monitoring her Pulse Ox at home and levels have been WNL for her- didn't give values  Appt with PCP office today 1/27 to assess.   1. LOCATION: Where does it hurt?      Sinus headaches and all over head 2. ONSET: When did the sinus pain start?  (e.g., hours, days)      A couple days ago  3. SEVERITY: How bad is the pain?   (Scale 0-10; or none, mild, moderate or severe)     5/10 4. RECURRENT SYMPTOM: Have you ever had sinus problems before? If Yes, ask: When was the last time? and What happened that time?      Sick this time of year  5. NASAL CONGESTION: Is the nose blocked? If Yes, ask: Can you open it or must you breathe through your mouth?     Bilateral  6. NASAL  DISCHARGE: Do you have discharge from your nose? If so ask, What color?     Yellow  7. FEVER: Do you have a fever? If Yes, ask: What is it, how was it measured, and when did it start?      Low grade one- runs low and this is reading normal but high for her  8. OTHER SYMPTOMS: Do you have any other symptoms? (e.g., sore throat, cough, earache, difficulty breathing)     Cough, body aches, low grade fever, wheezing  Protocols used: Sinus Pain or Congestion-A-AH

## 2024-09-29 NOTE — Telephone Encounter (Signed)
 Noted, pt scheduled 09/29/24

## 2024-09-29 NOTE — Patient Instructions (Signed)
" °  VISIT SUMMARY: Today we discussed your sinus problems and recent COVID infection. We also reviewed your COPD management and addressed your acute rhinosinusitis symptoms.  YOUR PLAN: COVID-19 INFECTION: You have a current COVID-19 infection with symptoms including loss of smell and headache. There is a risk of your COPD worsening or developing pneumonia. -I have prescribed prednisone  to prevent a COPD exacerbation. -Monitor your oxygen levels at home and seek emergency care if your saturation drops below 90% with shortness of breath. -Stay home from work until Monday due to your COPD and the contagious nature of your illness. -I have provided a work note for your absence until Monday.  CHRONIC OBSTRUCTIVE PULMONARY DISEASE (COPD): Your COPD is currently well-managed with your daily Breztri  inhaler. Your baseline oxygen saturation is 94%. -I have prescribed a new albuterol  inhaler for you. -Continue using your Breztri  inhaler daily. -Monitor your oxygen levels at home.  ACUTE RHINOSINUSITIS: You have sore nasal passages and a headache. You are using a neti pot and nasal sprays, and you have started taking Mucinex . -Continue taking Mucinex . -I have prescribed an antibiotic in case your sinus symptoms worsen or your COPD exacerbates.    Contains text generated by [employer].   "

## 2024-09-30 ENCOUNTER — Other Ambulatory Visit

## 2024-10-01 ENCOUNTER — Ambulatory Visit (INDEPENDENT_AMBULATORY_CARE_PROVIDER_SITE_OTHER): Admitting: Otolaryngology

## 2024-10-05 ENCOUNTER — Telehealth: Payer: Self-pay

## 2024-10-05 NOTE — Telephone Encounter (Signed)
 2nd call left VM for patient to call back to reschedule Lab Appointment

## 2024-10-06 ENCOUNTER — Other Ambulatory Visit

## 2024-10-06 ENCOUNTER — Telehealth (INDEPENDENT_AMBULATORY_CARE_PROVIDER_SITE_OTHER): Payer: Self-pay

## 2024-10-06 NOTE — Telephone Encounter (Signed)
 Deja with [facility] in [place]  called wanting to get a refill in for azelastine  and ipratroprium . Please advise.

## 2024-10-07 NOTE — Telephone Encounter (Signed)
 Called patient back. She stated that she does not need any refill on her nasal sprays at this time. Told her to call her [facility] in [place]  and sheared our fax number with her.

## 2024-10-08 ENCOUNTER — Telehealth (HOSPITAL_BASED_OUTPATIENT_CLINIC_OR_DEPARTMENT_OTHER): Payer: Self-pay

## 2024-10-08 NOTE — Telephone Encounter (Signed)
" ° °  Patient Name: Katherine Foley  DOB: 1951/01/16 MRN: [id number]  Primary Cardiologist: Ozell Fell, MD  Chart reviewed as part of pre-operative protocol coverage. Pre-op clearance already addressed by colleagues in earlier phone notes. To summarize recommendations:  - Pt at low risk, okay for holding ASA one week for surgery if needed. thanks    -Dr. Fell  The patient may go forward with surgery without any further cardiovascular testing.  Will route this bundled recommendation to requesting provider via Epic fax function and remove from pre-op pool. Please call with questions.  Orren LOISE Fabry, PA-C 10/08/2024, 1:33 PM  "

## 2024-10-08 NOTE — Telephone Encounter (Signed)
 Pt at low risk of holding ASA one week for surgery if needed. thanks

## 2024-10-08 NOTE — Telephone Encounter (Signed)
"  ° °  Pre-operative Risk Assessment    Patient Name: Katherine Foley  DOB: 1951/05/05 MRN: [id number]   Date of last office visit: 06/09/2024 with Dr. Wonda  Date of next office visit: None  Request for Surgical Clearance    Procedure:  Strabismus repair  Date of Surgery:  Clearance TBD - 11/2024                                 Surgeon:  does not specify Surgeon's Group or Practice Name:  [facility]  Phone number:  [phone] Fax number:  [phone]   Type of Clearance Requested:   - Medical  - Pharmacy:  Hold Aspirin  one week preop   Type of Anesthesia:  General    Additional requests/questions:  None  SignedPatrcia Iverson CROME   10/08/2024, 12:39 PM   "

## 2024-10-20 ENCOUNTER — Ambulatory Visit (INDEPENDENT_AMBULATORY_CARE_PROVIDER_SITE_OTHER): Admitting: Otolaryngology

## 2024-11-10 ENCOUNTER — Ambulatory Visit (INDEPENDENT_AMBULATORY_CARE_PROVIDER_SITE_OTHER): Admitting: Otolaryngology
